# Patient Record
Sex: Male | Born: 1940 | Race: White | Hispanic: No | Marital: Married | Smoking: Former smoker
Health system: Southern US, Community
[De-identification: ages and names within clinical notes are randomized; demographics above are authoritative.]

## PROBLEM LIST (undated history)

## (undated) DIAGNOSIS — K589 Irritable bowel syndrome without diarrhea: Secondary | ICD-10-CM

## (undated) DIAGNOSIS — K219 Gastro-esophageal reflux disease without esophagitis: Secondary | ICD-10-CM

## (undated) DIAGNOSIS — R112 Nausea with vomiting, unspecified: Secondary | ICD-10-CM

## (undated) DIAGNOSIS — C9590 Leukemia, unspecified not having achieved remission: Secondary | ICD-10-CM

## (undated) DIAGNOSIS — H269 Unspecified cataract: Secondary | ICD-10-CM

## (undated) DIAGNOSIS — E785 Hyperlipidemia, unspecified: Secondary | ICD-10-CM

## (undated) DIAGNOSIS — Z9889 Other specified postprocedural states: Secondary | ICD-10-CM

## (undated) DIAGNOSIS — H3530 Unspecified macular degeneration: Secondary | ICD-10-CM

## (undated) DIAGNOSIS — F329 Major depressive disorder, single episode, unspecified: Secondary | ICD-10-CM

## (undated) DIAGNOSIS — K5730 Diverticulosis of large intestine without perforation or abscess without bleeding: Secondary | ICD-10-CM

## (undated) DIAGNOSIS — M1990 Unspecified osteoarthritis, unspecified site: Secondary | ICD-10-CM

## (undated) DIAGNOSIS — E039 Hypothyroidism, unspecified: Secondary | ICD-10-CM

## (undated) DIAGNOSIS — R972 Elevated prostate specific antigen [PSA]: Secondary | ICD-10-CM

## (undated) DIAGNOSIS — F32A Depression, unspecified: Secondary | ICD-10-CM

## (undated) DIAGNOSIS — F4310 Post-traumatic stress disorder, unspecified: Secondary | ICD-10-CM

## (undated) DIAGNOSIS — G4733 Obstructive sleep apnea (adult) (pediatric): Secondary | ICD-10-CM

## (undated) DIAGNOSIS — C61 Malignant neoplasm of prostate: Secondary | ICD-10-CM

## (undated) DIAGNOSIS — G4730 Sleep apnea, unspecified: Secondary | ICD-10-CM

## (undated) DIAGNOSIS — I10 Essential (primary) hypertension: Secondary | ICD-10-CM

## (undated) DIAGNOSIS — M5136 Other intervertebral disc degeneration, lumbar region: Secondary | ICD-10-CM

## (undated) DIAGNOSIS — M51369 Other intervertebral disc degeneration, lumbar region without mention of lumbar back pain or lower extremity pain: Secondary | ICD-10-CM

## (undated) DIAGNOSIS — K635 Polyp of colon: Secondary | ICD-10-CM

## (undated) DIAGNOSIS — R251 Tremor, unspecified: Secondary | ICD-10-CM

## (undated) DIAGNOSIS — C4A9 Merkel cell carcinoma, unspecified: Secondary | ICD-10-CM

## (undated) DIAGNOSIS — F528 Other sexual dysfunction not due to a substance or known physiological condition: Secondary | ICD-10-CM

## (undated) HISTORY — DX: Polyp of colon: K63.5

## (undated) HISTORY — DX: Sleep apnea, unspecified: G47.30

## (undated) HISTORY — DX: Elevated prostate specific antigen (PSA): R97.20

## (undated) HISTORY — DX: Gastro-esophageal reflux disease without esophagitis: K21.9

## (undated) HISTORY — DX: Diverticulosis of large intestine without perforation or abscess without bleeding: K57.30

## (undated) HISTORY — DX: Hypothyroidism, unspecified: E03.9

## (undated) HISTORY — PX: VASECTOMY: SHX75

## (undated) HISTORY — PX: PENILE PROSTHESIS IMPLANT: SHX240

## (undated) HISTORY — DX: Unspecified cataract: H26.9

## (undated) HISTORY — DX: Unspecified osteoarthritis, unspecified site: M19.90

## (undated) HISTORY — DX: Major depressive disorder, single episode, unspecified: F32.9

## (undated) HISTORY — PX: HEMORRHOID SURGERY: SHX153

## (undated) HISTORY — PX: ORIF TIBIA FRACTURE: SHX5416

## (undated) HISTORY — DX: Obstructive sleep apnea (adult) (pediatric): G47.33

## (undated) HISTORY — PX: TONSILLECTOMY: SUR1361

## (undated) HISTORY — DX: Hyperlipidemia, unspecified: E78.5

## (undated) HISTORY — DX: Irritable bowel syndrome, unspecified: K58.9

## (undated) HISTORY — DX: Leukemia, unspecified not having achieved remission: C95.90

## (undated) HISTORY — DX: Malignant neoplasm of prostate: C61

## (undated) HISTORY — DX: Unspecified macular degeneration: H35.30

## (undated) HISTORY — PX: COLONOSCOPY: SHX174

## (undated) HISTORY — DX: Essential (primary) hypertension: I10

## (undated) HISTORY — DX: Depression, unspecified: F32.A

## (undated) HISTORY — DX: Other sexual dysfunction not due to a substance or known physiological condition: F52.8

## (undated) HISTORY — PX: FOOT SURGERY: SHX648

## (undated) HISTORY — PX: CHOLECYSTECTOMY: SHX55

---

## 1999-04-02 DIAGNOSIS — D229 Melanocytic nevi, unspecified: Secondary | ICD-10-CM

## 1999-04-02 HISTORY — DX: Melanocytic nevi, unspecified: D22.9

## 1999-09-17 ENCOUNTER — Encounter: Payer: Self-pay | Admitting: Internal Medicine

## 2000-12-24 ENCOUNTER — Encounter: Payer: Self-pay | Admitting: Emergency Medicine

## 2000-12-24 ENCOUNTER — Inpatient Hospital Stay (HOSPITAL_COMMUNITY): Admission: EM | Admit: 2000-12-24 | Discharge: 2000-12-29 | Payer: Self-pay | Admitting: Emergency Medicine

## 2000-12-24 ENCOUNTER — Encounter: Payer: Self-pay | Admitting: Orthopedic Surgery

## 2000-12-26 ENCOUNTER — Encounter: Payer: Self-pay | Admitting: Orthopedic Surgery

## 2000-12-29 ENCOUNTER — Inpatient Hospital Stay (HOSPITAL_COMMUNITY)
Admission: RE | Admit: 2000-12-29 | Discharge: 2001-01-05 | Payer: Self-pay | Admitting: Physical Medicine & Rehabilitation

## 2001-02-03 ENCOUNTER — Other Ambulatory Visit: Admission: RE | Admit: 2001-02-03 | Discharge: 2001-02-03 | Payer: Self-pay | Admitting: Urology

## 2001-02-03 ENCOUNTER — Encounter (INDEPENDENT_AMBULATORY_CARE_PROVIDER_SITE_OTHER): Payer: Self-pay

## 2001-06-01 DIAGNOSIS — D229 Melanocytic nevi, unspecified: Secondary | ICD-10-CM

## 2001-06-01 HISTORY — DX: Melanocytic nevi, unspecified: D22.9

## 2002-01-29 ENCOUNTER — Encounter: Payer: Self-pay | Admitting: Orthopedic Surgery

## 2002-02-03 ENCOUNTER — Ambulatory Visit (HOSPITAL_COMMUNITY): Admission: RE | Admit: 2002-02-03 | Discharge: 2002-02-03 | Payer: Self-pay | Admitting: Orthopedic Surgery

## 2002-12-02 LAB — HM COLONOSCOPY

## 2003-08-03 ENCOUNTER — Encounter: Payer: Self-pay | Admitting: General Surgery

## 2003-08-05 ENCOUNTER — Encounter (INDEPENDENT_AMBULATORY_CARE_PROVIDER_SITE_OTHER): Payer: Self-pay | Admitting: *Deleted

## 2003-08-05 ENCOUNTER — Ambulatory Visit (HOSPITAL_COMMUNITY): Admission: RE | Admit: 2003-08-05 | Discharge: 2003-08-05 | Payer: Self-pay | Admitting: General Surgery

## 2003-08-05 ENCOUNTER — Encounter: Payer: Self-pay | Admitting: General Surgery

## 2003-08-16 ENCOUNTER — Encounter: Payer: Self-pay | Admitting: Internal Medicine

## 2003-09-16 ENCOUNTER — Ambulatory Visit (HOSPITAL_BASED_OUTPATIENT_CLINIC_OR_DEPARTMENT_OTHER): Admission: RE | Admit: 2003-09-16 | Discharge: 2003-09-16 | Payer: Self-pay | Admitting: Internal Medicine

## 2003-09-16 ENCOUNTER — Encounter: Payer: Self-pay | Admitting: Internal Medicine

## 2004-12-11 ENCOUNTER — Ambulatory Visit: Payer: Self-pay | Admitting: Internal Medicine

## 2004-12-21 ENCOUNTER — Ambulatory Visit: Payer: Self-pay | Admitting: Internal Medicine

## 2005-02-18 ENCOUNTER — Ambulatory Visit: Payer: Self-pay | Admitting: Internal Medicine

## 2005-02-27 ENCOUNTER — Ambulatory Visit: Payer: Self-pay | Admitting: Internal Medicine

## 2005-06-18 ENCOUNTER — Ambulatory Visit: Payer: Self-pay | Admitting: Internal Medicine

## 2005-06-25 ENCOUNTER — Ambulatory Visit: Payer: Self-pay | Admitting: Internal Medicine

## 2005-09-25 ENCOUNTER — Ambulatory Visit: Payer: Self-pay | Admitting: Internal Medicine

## 2005-10-02 ENCOUNTER — Ambulatory Visit: Payer: Self-pay | Admitting: Internal Medicine

## 2006-01-08 ENCOUNTER — Ambulatory Visit: Payer: Self-pay | Admitting: Internal Medicine

## 2006-04-10 ENCOUNTER — Ambulatory Visit: Payer: Self-pay | Admitting: Internal Medicine

## 2006-08-15 ENCOUNTER — Ambulatory Visit: Payer: Self-pay | Admitting: Internal Medicine

## 2006-12-04 ENCOUNTER — Ambulatory Visit: Payer: Self-pay | Admitting: Gastroenterology

## 2006-12-08 ENCOUNTER — Ambulatory Visit: Payer: Self-pay | Admitting: Internal Medicine

## 2006-12-08 LAB — CONVERTED CEMR LAB
ALT: 56 units/L — ABNORMAL HIGH (ref 0–40)
AST: 37 units/L (ref 0–37)
Albumin: 4.1 g/dL (ref 3.5–5.2)
Alkaline Phosphatase: 53 units/L (ref 39–117)
BUN: 12 mg/dL (ref 6–23)
Basophils Absolute: 0.1 10*3/uL (ref 0.0–0.1)
Basophils Relative: 0.7 % (ref 0.0–1.0)
Bilirubin, Direct: 0.2 mg/dL (ref 0.0–0.3)
CO2: 27 meq/L (ref 19–32)
Calcium: 9.4 mg/dL (ref 8.4–10.5)
Chloride: 100 meq/L (ref 96–112)
Creatinine, Ser: 1 mg/dL (ref 0.4–1.5)
Eosinophil percent: 2.2 % (ref 0.0–5.0)
GFR calc non Af Amer: 80 mL/min
Glomerular Filtration Rate, Af Am: 97 mL/min/{1.73_m2}
Glucose, Bld: 218 mg/dL — ABNORMAL HIGH (ref 70–99)
HCT: 45.6 % (ref 39.0–52.0)
Hemoglobin: 15.7 g/dL (ref 13.0–17.0)
Lymphocytes Relative: 25.5 % (ref 12.0–46.0)
MCHC: 34.5 g/dL (ref 30.0–36.0)
MCV: 81.2 fL (ref 78.0–100.0)
Monocytes Absolute: 0.8 10*3/uL — ABNORMAL HIGH (ref 0.2–0.7)
Monocytes Relative: 8.3 % (ref 3.0–11.0)
Neutro Abs: 6.2 10*3/uL (ref 1.4–7.7)
Neutrophils Relative %: 63.3 % (ref 43.0–77.0)
Platelets: 284 10*3/uL (ref 150–400)
Potassium: 3.7 meq/L (ref 3.5–5.1)
RBC: 5.62 M/uL (ref 4.22–5.81)
RDW: 13.1 % (ref 11.5–14.6)
Sodium: 135 meq/L (ref 135–145)
Total Bilirubin: 1.2 mg/dL (ref 0.3–1.2)
Total Protein: 6.9 g/dL (ref 6.0–8.3)
WBC: 9.8 10*3/uL (ref 4.5–10.5)

## 2006-12-18 ENCOUNTER — Ambulatory Visit: Payer: Self-pay | Admitting: Internal Medicine

## 2007-01-29 ENCOUNTER — Ambulatory Visit: Payer: Self-pay | Admitting: Internal Medicine

## 2007-01-29 LAB — CONVERTED CEMR LAB
ALT: 42 units/L — ABNORMAL HIGH (ref 0–40)
AST: 28 units/L (ref 0–37)
Albumin: 4.1 g/dL (ref 3.5–5.2)
Alkaline Phosphatase: 44 units/L (ref 39–117)
BUN: 11 mg/dL (ref 6–23)
Bilirubin, Direct: 0.1 mg/dL (ref 0.0–0.3)
CO2: 30 meq/L (ref 19–32)
Calcium: 9.1 mg/dL (ref 8.4–10.5)
Chloride: 98 meq/L (ref 96–112)
Cholesterol: 173 mg/dL (ref 0–200)
Creatinine, Ser: 1 mg/dL (ref 0.4–1.5)
GFR calc Af Amer: 97 mL/min
GFR calc non Af Amer: 80 mL/min
Glucose, Bld: 89 mg/dL (ref 70–99)
HDL: 38.7 mg/dL — ABNORMAL LOW (ref >39.0)
Hgb A1c MFr Bld: 6.7 % — ABNORMAL HIGH (ref 4.6–6.0)
LDL Cholesterol: 101 mg/dL — ABNORMAL HIGH (ref 0–99)
Potassium: 3.7 meq/L (ref 3.5–5.1)
Sodium: 136 meq/L (ref 135–145)
Total Bilirubin: 1.3 mg/dL — ABNORMAL HIGH (ref 0.3–1.2)
Total CHOL/HDL Ratio: 4.5
Total Protein: 6.7 g/dL (ref 6.0–8.3)
Triglycerides: 166 mg/dL — ABNORMAL HIGH (ref 0–149)
VLDL: 33 mg/dL (ref 0–40)

## 2007-05-07 ENCOUNTER — Ambulatory Visit: Payer: Self-pay | Admitting: Internal Medicine

## 2007-05-07 LAB — CONVERTED CEMR LAB
ALT: 45 units/L — ABNORMAL HIGH (ref 0–40)
AST: 31 units/L (ref 0–37)
Albumin: 4.3 g/dL (ref 3.5–5.2)
Alkaline Phosphatase: 42 units/L (ref 39–117)
BUN: 17 mg/dL (ref 6–23)
Bilirubin, Direct: 0.1 mg/dL (ref 0.0–0.3)
CO2: 29 meq/L (ref 19–32)
Calcium: 9.4 mg/dL (ref 8.4–10.5)
Chloride: 102 meq/L (ref 96–112)
Cholesterol: 185 mg/dL (ref 0–200)
Creatinine, Ser: 0.9 mg/dL (ref 0.4–1.5)
Direct LDL: 115.1 mg/dL
GFR calc Af Amer: 110 mL/min
GFR calc non Af Amer: 91 mL/min
Glucose, Bld: 85 mg/dL (ref 70–99)
HDL: 39.6 mg/dL (ref >39.0)
Hgb A1c MFr Bld: 7 % — ABNORMAL HIGH (ref 4.6–6.0)
Potassium: 3.8 meq/L (ref 3.5–5.1)
Sodium: 139 meq/L (ref 135–145)
Total Bilirubin: 1.2 mg/dL (ref 0.3–1.2)
Total CHOL/HDL Ratio: 4.7
Total Protein: 7.1 g/dL (ref 6.0–8.3)
Triglycerides: 218 mg/dL (ref 0–149)
VLDL: 44 mg/dL — ABNORMAL HIGH (ref 0–40)

## 2007-05-20 ENCOUNTER — Ambulatory Visit: Payer: Self-pay | Admitting: Internal Medicine

## 2007-09-22 ENCOUNTER — Telehealth: Payer: Self-pay | Admitting: Internal Medicine

## 2007-09-24 ENCOUNTER — Ambulatory Visit: Payer: Self-pay | Admitting: Internal Medicine

## 2007-09-24 DIAGNOSIS — I152 Hypertension secondary to endocrine disorders: Secondary | ICD-10-CM

## 2007-09-24 DIAGNOSIS — E11319 Type 2 diabetes mellitus with unspecified diabetic retinopathy without macular edema: Secondary | ICD-10-CM

## 2007-09-24 DIAGNOSIS — E785 Hyperlipidemia, unspecified: Secondary | ICD-10-CM

## 2007-09-24 DIAGNOSIS — Z9989 Dependence on other enabling machines and devices: Secondary | ICD-10-CM

## 2007-09-24 DIAGNOSIS — I10 Essential (primary) hypertension: Secondary | ICD-10-CM | POA: Insufficient documentation

## 2007-09-24 DIAGNOSIS — E1169 Type 2 diabetes mellitus with other specified complication: Secondary | ICD-10-CM | POA: Insufficient documentation

## 2007-09-24 DIAGNOSIS — E1139 Type 2 diabetes mellitus with other diabetic ophthalmic complication: Secondary | ICD-10-CM | POA: Insufficient documentation

## 2007-09-24 DIAGNOSIS — E1159 Type 2 diabetes mellitus with other circulatory complications: Secondary | ICD-10-CM | POA: Insufficient documentation

## 2007-09-24 DIAGNOSIS — E119 Type 2 diabetes mellitus without complications: Secondary | ICD-10-CM | POA: Insufficient documentation

## 2007-09-24 DIAGNOSIS — K219 Gastro-esophageal reflux disease without esophagitis: Secondary | ICD-10-CM | POA: Insufficient documentation

## 2007-09-24 DIAGNOSIS — G4733 Obstructive sleep apnea (adult) (pediatric): Secondary | ICD-10-CM

## 2007-09-24 DIAGNOSIS — E782 Mixed hyperlipidemia: Secondary | ICD-10-CM | POA: Insufficient documentation

## 2007-09-24 HISTORY — DX: Type 2 diabetes mellitus without complications: E11.9

## 2007-09-24 HISTORY — DX: Type 2 diabetes mellitus with other specified complication: E11.69

## 2007-09-24 HISTORY — DX: Hypertension secondary to endocrine disorders: I15.2

## 2007-09-24 HISTORY — DX: Type 2 diabetes mellitus with unspecified diabetic retinopathy without macular edema: E11.319

## 2007-09-24 HISTORY — DX: Type 2 diabetes mellitus with other circulatory complications: E11.59

## 2007-09-24 HISTORY — DX: Obstructive sleep apnea (adult) (pediatric): G47.33

## 2007-09-25 LAB — CONVERTED CEMR LAB
ALT: 56 units/L — ABNORMAL HIGH (ref 0–53)
AST: 39 units/L — ABNORMAL HIGH (ref 0–37)
Albumin: 4.3 g/dL (ref 3.5–5.2)
Alkaline Phosphatase: 52 units/L (ref 39–117)
BUN: 11 mg/dL (ref 6–23)
Bilirubin, Direct: 0.3 mg/dL (ref 0.0–0.3)
CO2: 30 meq/L (ref 19–32)
Calcium: 9.3 mg/dL (ref 8.4–10.5)
Chloride: 102 meq/L (ref 96–112)
Cholesterol: 157 mg/dL (ref 0–200)
Creatinine, Ser: 0.9 mg/dL (ref 0.4–1.5)
GFR calc Af Amer: 109 mL/min
GFR calc non Af Amer: 90 mL/min
Glucose, Bld: 110 mg/dL — ABNORMAL HIGH (ref 70–99)
HDL: 34.1 mg/dL — ABNORMAL LOW (ref >39.0)
Hgb A1c MFr Bld: 7.7 % — ABNORMAL HIGH (ref 4.6–6.0)
LDL Cholesterol: 88 mg/dL (ref 0–99)
Potassium: 4 meq/L (ref 3.5–5.1)
Sodium: 139 meq/L (ref 135–145)
Total Bilirubin: 1.6 mg/dL — ABNORMAL HIGH (ref 0.3–1.2)
Total CHOL/HDL Ratio: 4.6
Total Protein: 7 g/dL (ref 6.0–8.3)
Triglycerides: 174 mg/dL — ABNORMAL HIGH (ref 0–149)
VLDL: 35 mg/dL (ref 0–40)

## 2007-12-24 ENCOUNTER — Ambulatory Visit: Payer: Self-pay | Admitting: Internal Medicine

## 2007-12-24 LAB — CONVERTED CEMR LAB
ALT: 45 units/L (ref 0–53)
AST: 32 units/L (ref 0–37)
Albumin: 4.2 g/dL (ref 3.5–5.2)
Alkaline Phosphatase: 45 units/L (ref 39–117)
BUN: 9 mg/dL (ref 6–23)
Bilirubin, Direct: 0.2 mg/dL (ref 0.0–0.3)
CO2: 28 meq/L (ref 19–32)
Calcium: 9.3 mg/dL (ref 8.4–10.5)
Chloride: 99 meq/L (ref 96–112)
Cholesterol: 174 mg/dL (ref 0–200)
Creatinine, Ser: 1 mg/dL (ref 0.4–1.5)
Creatinine,U: 49.7 mg/dL
GFR calc Af Amer: 97 mL/min
GFR calc non Af Amer: 80 mL/min
Glucose, Bld: 81 mg/dL (ref 70–99)
HDL: 34.9 mg/dL — ABNORMAL LOW (ref >39.0)
Hgb A1c MFr Bld: 7.2 % — ABNORMAL HIGH (ref 4.6–6.0)
LDL Cholesterol: 111 mg/dL — ABNORMAL HIGH (ref 0–99)
Microalb Creat Ratio: 8 mg/g (ref 0.0–30.0)
Microalb, Ur: 0.4 mg/dL (ref 0.0–1.9)
Potassium: 4 meq/L (ref 3.5–5.1)
Sodium: 137 meq/L (ref 135–145)
Total Bilirubin: 1.4 mg/dL — ABNORMAL HIGH (ref 0.3–1.2)
Total CHOL/HDL Ratio: 5
Total Protein: 6.7 g/dL (ref 6.0–8.3)
Triglycerides: 141 mg/dL (ref 0–149)
VLDL: 28 mg/dL (ref 0–40)

## 2007-12-30 ENCOUNTER — Telehealth: Payer: Self-pay | Admitting: Internal Medicine

## 2007-12-30 ENCOUNTER — Ambulatory Visit: Payer: Self-pay | Admitting: Internal Medicine

## 2007-12-30 DIAGNOSIS — F528 Other sexual dysfunction not due to a substance or known physiological condition: Secondary | ICD-10-CM

## 2007-12-30 HISTORY — DX: Other sexual dysfunction not due to a substance or known physiological condition: F52.8

## 2008-01-05 ENCOUNTER — Encounter: Payer: Self-pay | Admitting: Internal Medicine

## 2008-01-22 ENCOUNTER — Telehealth (INDEPENDENT_AMBULATORY_CARE_PROVIDER_SITE_OTHER): Payer: Self-pay | Admitting: *Deleted

## 2008-01-22 ENCOUNTER — Encounter: Payer: Self-pay | Admitting: Internal Medicine

## 2008-01-26 ENCOUNTER — Ambulatory Visit: Payer: Self-pay | Admitting: Internal Medicine

## 2008-01-28 ENCOUNTER — Telehealth (INDEPENDENT_AMBULATORY_CARE_PROVIDER_SITE_OTHER): Payer: Self-pay | Admitting: *Deleted

## 2008-02-02 ENCOUNTER — Encounter: Payer: Self-pay | Admitting: Urology

## 2008-02-03 ENCOUNTER — Inpatient Hospital Stay (HOSPITAL_COMMUNITY): Admission: RE | Admit: 2008-02-03 | Discharge: 2008-02-04 | Payer: Self-pay | Admitting: Urology

## 2008-03-08 ENCOUNTER — Encounter: Payer: Self-pay | Admitting: Internal Medicine

## 2008-04-21 ENCOUNTER — Ambulatory Visit: Payer: Self-pay | Admitting: Internal Medicine

## 2008-04-21 LAB — CONVERTED CEMR LAB
ALT: 42 units/L (ref 0–53)
AST: 33 units/L (ref 0–37)
Albumin: 3.9 g/dL (ref 3.5–5.2)
Alkaline Phosphatase: 46 units/L (ref 39–117)
BUN: 12 mg/dL (ref 6–23)
Bilirubin, Direct: 0.1 mg/dL (ref 0.0–0.3)
CO2: 28 meq/L (ref 19–32)
Calcium: 8.5 mg/dL (ref 8.4–10.5)
Chloride: 100 meq/L (ref 96–112)
Cholesterol: 153 mg/dL (ref 0–200)
Creatinine, Ser: 0.9 mg/dL (ref 0.4–1.5)
GFR calc Af Amer: 109 mL/min
GFR calc non Af Amer: 90 mL/min
Glucose, Bld: 116 mg/dL — ABNORMAL HIGH (ref 70–99)
HDL: 32.6 mg/dL — ABNORMAL LOW (ref >39.0)
Hgb A1c MFr Bld: 7.4 % — ABNORMAL HIGH (ref 4.6–6.0)
LDL Cholesterol: 96 mg/dL (ref 0–99)
Potassium: 3.5 meq/L (ref 3.5–5.1)
Sodium: 137 meq/L (ref 135–145)
Total Bilirubin: 1.1 mg/dL (ref 0.3–1.2)
Total CHOL/HDL Ratio: 4.7
Total Protein: 6.6 g/dL (ref 6.0–8.3)
Triglycerides: 120 mg/dL (ref 0–149)
VLDL: 24 mg/dL (ref 0–40)

## 2008-04-28 ENCOUNTER — Ambulatory Visit: Payer: Self-pay | Admitting: Internal Medicine

## 2008-05-10 ENCOUNTER — Encounter: Payer: Self-pay | Admitting: Internal Medicine

## 2008-05-12 ENCOUNTER — Ambulatory Visit (HOSPITAL_COMMUNITY): Admission: RE | Admit: 2008-05-12 | Discharge: 2008-05-13 | Payer: Self-pay | Admitting: Urology

## 2008-06-24 ENCOUNTER — Telehealth: Payer: Self-pay | Admitting: Internal Medicine

## 2008-08-22 ENCOUNTER — Ambulatory Visit: Payer: Self-pay | Admitting: Internal Medicine

## 2008-08-22 DIAGNOSIS — Z860100 Personal history of colon polyps, unspecified: Secondary | ICD-10-CM

## 2008-08-22 HISTORY — DX: Personal history of colonic polyps: Z86.010

## 2008-08-22 HISTORY — DX: Personal history of colon polyps, unspecified: Z86.0100

## 2008-08-23 ENCOUNTER — Ambulatory Visit: Payer: Self-pay | Admitting: Internal Medicine

## 2008-08-23 LAB — CONVERTED CEMR LAB
ALT: 47 units/L (ref 0–53)
AST: 36 units/L (ref 0–37)
Albumin: 4 g/dL (ref 3.5–5.2)
Alkaline Phosphatase: 43 units/L (ref 39–117)
BUN: 10 mg/dL (ref 6–23)
Bilirubin, Direct: 0.2 mg/dL (ref 0.0–0.3)
CO2: 31 meq/L (ref 19–32)
Calcium: 9 mg/dL (ref 8.4–10.5)
Chloride: 105 meq/L (ref 96–112)
Cholesterol: 148 mg/dL (ref 0–200)
Creatinine, Ser: 0.9 mg/dL (ref 0.4–1.5)
GFR calc Af Amer: 109 mL/min
GFR calc non Af Amer: 90 mL/min
Glucose, Bld: 118 mg/dL — ABNORMAL HIGH (ref 70–99)
HDL: 34.9 mg/dL — ABNORMAL LOW (ref >39.0)
Hgb A1c MFr Bld: 7.8 % — ABNORMAL HIGH (ref 4.6–6.0)
LDL Cholesterol: 78 mg/dL (ref 0–99)
Potassium: 4.1 meq/L (ref 3.5–5.1)
Sodium: 141 meq/L (ref 135–145)
Total Bilirubin: 1.2 mg/dL (ref 0.3–1.2)
Total CHOL/HDL Ratio: 4.2
Total Protein: 6.7 g/dL (ref 6.0–8.3)
Triglycerides: 176 mg/dL — ABNORMAL HIGH (ref 0–149)
VLDL: 35 mg/dL (ref 0–40)

## 2008-08-30 ENCOUNTER — Ambulatory Visit: Payer: Self-pay | Admitting: Internal Medicine

## 2008-09-12 ENCOUNTER — Encounter: Payer: Self-pay | Admitting: Internal Medicine

## 2008-09-28 ENCOUNTER — Encounter: Payer: Self-pay | Admitting: Internal Medicine

## 2008-09-28 ENCOUNTER — Ambulatory Visit: Payer: Self-pay | Admitting: Internal Medicine

## 2008-09-28 HISTORY — PX: ESOPHAGOGASTRODUODENOSCOPY ENDOSCOPY: SHX5814

## 2008-09-30 ENCOUNTER — Encounter: Payer: Self-pay | Admitting: Internal Medicine

## 2008-10-13 ENCOUNTER — Ambulatory Visit: Payer: Self-pay | Admitting: Family Medicine

## 2008-10-25 ENCOUNTER — Ambulatory Visit: Payer: Self-pay | Admitting: Internal Medicine

## 2008-10-25 DIAGNOSIS — E669 Obesity, unspecified: Secondary | ICD-10-CM | POA: Insufficient documentation

## 2008-12-30 ENCOUNTER — Ambulatory Visit: Payer: Self-pay | Admitting: Internal Medicine

## 2008-12-30 LAB — CONVERTED CEMR LAB
ALT: 34 units/L (ref 0–53)
AST: 25 units/L (ref 0–37)
BUN: 12 mg/dL (ref 6–23)
CO2: 29 meq/L (ref 19–32)
Calcium: 8.9 mg/dL (ref 8.4–10.5)
Chloride: 100 meq/L (ref 96–112)
Cholesterol: 155 mg/dL (ref 0–200)
Creatinine, Ser: 0.9 mg/dL (ref 0.4–1.5)
GFR calc Af Amer: 109 mL/min
GFR calc non Af Amer: 90 mL/min
Glucose, Bld: 89 mg/dL (ref 70–99)
HDL: 46.6 mg/dL (ref >39.0)
Hgb A1c MFr Bld: 7 % — ABNORMAL HIGH (ref 4.6–6.0)
LDL Cholesterol: 90 mg/dL (ref 0–99)
Potassium: 3.7 meq/L (ref 3.5–5.1)
Sodium: 139 meq/L (ref 135–145)
Total CHOL/HDL Ratio: 3.3
Triglycerides: 94 mg/dL (ref 0–149)
VLDL: 19 mg/dL (ref 0–40)

## 2009-03-07 ENCOUNTER — Encounter: Payer: Self-pay | Admitting: Internal Medicine

## 2009-04-24 ENCOUNTER — Telehealth: Payer: Self-pay | Admitting: Internal Medicine

## 2009-04-24 ENCOUNTER — Ambulatory Visit: Payer: Self-pay | Admitting: Internal Medicine

## 2009-04-24 LAB — CONVERTED CEMR LAB
ALT: 37 units/L (ref 0–53)
AST: 28 units/L (ref 0–37)
Albumin: 3.8 g/dL (ref 3.5–5.2)
Alkaline Phosphatase: 37 units/L — ABNORMAL LOW (ref 39–117)
BUN: 11 mg/dL (ref 6–23)
Bilirubin, Direct: 0.2 mg/dL (ref 0.0–0.3)
CO2: 26 meq/L (ref 19–32)
Calcium: 8.7 mg/dL (ref 8.4–10.5)
Chloride: 107 meq/L (ref 96–112)
Cholesterol: 146 mg/dL (ref 0–200)
Creatinine, Ser: 1 mg/dL (ref 0.4–1.5)
GFR calc non Af Amer: 79.53 mL/min (ref >60)
Glucose, Bld: 100 mg/dL — ABNORMAL HIGH (ref 70–99)
HDL: 33.7 mg/dL — ABNORMAL LOW (ref >39.00)
Hgb A1c MFr Bld: 6.8 % — ABNORMAL HIGH (ref 4.6–6.5)
LDL Cholesterol: 90 mg/dL (ref 0–99)
Potassium: 3.4 meq/L — ABNORMAL LOW (ref 3.5–5.1)
Sodium: 140 meq/L (ref 135–145)
Total Bilirubin: 0.8 mg/dL (ref 0.3–1.2)
Total CHOL/HDL Ratio: 4
Total Protein: 6.5 g/dL (ref 6.0–8.3)
Triglycerides: 111 mg/dL (ref 0.0–149.0)
VLDL: 22.2 mg/dL (ref 0.0–40.0)

## 2009-05-19 ENCOUNTER — Ambulatory Visit: Payer: Self-pay | Admitting: Internal Medicine

## 2009-06-16 ENCOUNTER — Encounter: Admission: RE | Admit: 2009-06-16 | Discharge: 2009-06-16 | Payer: Self-pay | Admitting: Specialist

## 2009-06-17 ENCOUNTER — Encounter: Admission: RE | Admit: 2009-06-17 | Discharge: 2009-06-17 | Payer: Self-pay | Admitting: Specialist

## 2009-06-30 ENCOUNTER — Telehealth (INDEPENDENT_AMBULATORY_CARE_PROVIDER_SITE_OTHER): Payer: Self-pay | Admitting: *Deleted

## 2009-07-03 ENCOUNTER — Encounter: Payer: Self-pay | Admitting: Internal Medicine

## 2009-07-28 ENCOUNTER — Ambulatory Visit (HOSPITAL_BASED_OUTPATIENT_CLINIC_OR_DEPARTMENT_OTHER): Admission: RE | Admit: 2009-07-28 | Discharge: 2009-07-28 | Payer: Self-pay | Admitting: Specialist

## 2009-08-08 ENCOUNTER — Encounter: Admission: RE | Admit: 2009-08-08 | Discharge: 2009-08-31 | Payer: Self-pay | Admitting: Specialist

## 2009-08-17 ENCOUNTER — Telehealth: Payer: Self-pay | Admitting: Internal Medicine

## 2009-08-24 ENCOUNTER — Telehealth: Payer: Self-pay | Admitting: Internal Medicine

## 2009-08-28 ENCOUNTER — Encounter: Payer: Self-pay | Admitting: Internal Medicine

## 2009-09-14 ENCOUNTER — Ambulatory Visit: Payer: Self-pay | Admitting: Internal Medicine

## 2009-09-14 LAB — CONVERTED CEMR LAB
ALT: 46 units/L (ref 0–53)
AST: 34 units/L (ref 0–37)
Albumin: 4.1 g/dL (ref 3.5–5.2)
Alkaline Phosphatase: 44 units/L (ref 39–117)
BUN: 13 mg/dL (ref 6–23)
Bilirubin, Direct: 0.1 mg/dL (ref 0.0–0.3)
CO2: 29 meq/L (ref 19–32)
Calcium: 8.9 mg/dL (ref 8.4–10.5)
Chloride: 98 meq/L (ref 96–112)
Cholesterol: 151 mg/dL (ref 0–200)
Creatinine, Ser: 1 mg/dL (ref 0.4–1.5)
GFR calc non Af Amer: 79.44 mL/min (ref >60)
Glucose, Bld: 114 mg/dL — ABNORMAL HIGH (ref 70–99)
HDL: 38.4 mg/dL — ABNORMAL LOW (ref >39.00)
Hgb A1c MFr Bld: 6.8 % — ABNORMAL HIGH (ref 4.6–6.5)
LDL Cholesterol: 77 mg/dL (ref 0–99)
Potassium: 4 meq/L (ref 3.5–5.1)
Sodium: 137 meq/L (ref 135–145)
Total Bilirubin: 1 mg/dL (ref 0.3–1.2)
Total CHOL/HDL Ratio: 4
Total Protein: 6.7 g/dL (ref 6.0–8.3)
Triglycerides: 180 mg/dL — ABNORMAL HIGH (ref 0.0–149.0)
VLDL: 36 mg/dL (ref 0.0–40.0)

## 2009-09-21 ENCOUNTER — Ambulatory Visit: Payer: Self-pay | Admitting: Internal Medicine

## 2009-09-21 DIAGNOSIS — G4700 Insomnia, unspecified: Secondary | ICD-10-CM

## 2009-09-21 DIAGNOSIS — F329 Major depressive disorder, single episode, unspecified: Secondary | ICD-10-CM

## 2009-09-21 HISTORY — DX: Insomnia, unspecified: G47.00

## 2009-10-10 ENCOUNTER — Encounter (INDEPENDENT_AMBULATORY_CARE_PROVIDER_SITE_OTHER): Payer: Self-pay | Admitting: *Deleted

## 2009-11-23 ENCOUNTER — Telehealth: Payer: Self-pay | Admitting: *Deleted

## 2009-12-02 HISTORY — PX: SHOULDER SURGERY: SHX246

## 2010-01-09 ENCOUNTER — Ambulatory Visit: Payer: Self-pay | Admitting: Internal Medicine

## 2010-01-09 LAB — CONVERTED CEMR LAB
ALT: 37 units/L (ref 0–53)
AST: 27 units/L (ref 0–37)
Albumin: 4 g/dL (ref 3.5–5.2)
Alkaline Phosphatase: 51 units/L (ref 39–117)
BUN: 11 mg/dL (ref 6–23)
Bilirubin, Direct: 0.1 mg/dL (ref 0.0–0.3)
CO2: 30 meq/L (ref 19–32)
Calcium: 8.9 mg/dL (ref 8.4–10.5)
Chloride: 104 meq/L (ref 96–112)
Cholesterol: 138 mg/dL (ref 0–200)
Creatinine, Ser: 1 mg/dL (ref 0.4–1.5)
Direct LDL: 85.6 mg/dL
GFR calc non Af Amer: 79.36 mL/min (ref >60)
Glucose, Bld: 109 mg/dL — ABNORMAL HIGH (ref 70–99)
HDL: 40.9 mg/dL (ref >39.00)
Hgb A1c MFr Bld: 7.5 % — ABNORMAL HIGH (ref 4.6–6.5)
Potassium: 3.7 meq/L (ref 3.5–5.1)
Sodium: 140 meq/L (ref 135–145)
Total Bilirubin: 0.5 mg/dL (ref 0.3–1.2)
Total CHOL/HDL Ratio: 3
Total Protein: 6.6 g/dL (ref 6.0–8.3)
Triglycerides: 209 mg/dL — ABNORMAL HIGH (ref 0.0–149.0)
VLDL: 41.8 mg/dL — ABNORMAL HIGH (ref 0.0–40.0)

## 2010-01-17 ENCOUNTER — Ambulatory Visit: Payer: Self-pay | Admitting: Internal Medicine

## 2010-01-18 ENCOUNTER — Telehealth: Payer: Self-pay | Admitting: Internal Medicine

## 2010-02-20 ENCOUNTER — Encounter: Payer: Self-pay | Admitting: Internal Medicine

## 2010-02-27 ENCOUNTER — Encounter: Payer: Self-pay | Admitting: Internal Medicine

## 2010-03-19 ENCOUNTER — Telehealth: Payer: Self-pay | Admitting: Internal Medicine

## 2010-03-19 ENCOUNTER — Ambulatory Visit: Payer: Self-pay | Admitting: Internal Medicine

## 2010-03-26 ENCOUNTER — Encounter: Payer: Self-pay | Admitting: Internal Medicine

## 2010-04-26 ENCOUNTER — Encounter: Payer: Self-pay | Admitting: Internal Medicine

## 2010-05-17 ENCOUNTER — Ambulatory Visit (HOSPITAL_COMMUNITY)
Admission: RE | Admit: 2010-05-17 | Discharge: 2010-05-17 | Payer: Self-pay | Source: Home / Self Care | Admitting: Surgery

## 2010-05-23 ENCOUNTER — Ambulatory Visit: Payer: Self-pay | Admitting: Internal Medicine

## 2010-05-25 LAB — CONVERTED CEMR LAB
ALT: 45 units/L (ref 0–53)
AST: 38 units/L — ABNORMAL HIGH (ref 0–37)
Albumin: 4.2 g/dL (ref 3.5–5.2)
Alkaline Phosphatase: 49 units/L (ref 39–117)
BUN: 14 mg/dL (ref 6–23)
Bilirubin, Direct: 0.2 mg/dL (ref 0.0–0.3)
CO2: 31 meq/L (ref 19–32)
Calcium: 8.9 mg/dL (ref 8.4–10.5)
Chloride: 103 meq/L (ref 96–112)
Cholesterol: 159 mg/dL (ref 0–200)
Creatinine, Ser: 1 mg/dL (ref 0.4–1.5)
Direct LDL: 98.8 mg/dL
Free T4: 0.84 ng/dL (ref 0.60–1.60)
GFR calc non Af Amer: 83.09 mL/min (ref >60)
Glucose, Bld: 108 mg/dL — ABNORMAL HIGH (ref 70–99)
H Pylori IgG: NEGATIVE
HDL: 39.9 mg/dL (ref >39.00)
Hgb A1c MFr Bld: 8 % — ABNORMAL HIGH (ref 4.6–6.5)
Potassium: 4.2 meq/L (ref 3.5–5.1)
Sodium: 142 meq/L (ref 135–145)
TSH: 2.29 microintl units/mL (ref 0.35–5.50)
Total Bilirubin: 1.1 mg/dL (ref 0.3–1.2)
Total CHOL/HDL Ratio: 4
Total Protein: 6.7 g/dL (ref 6.0–8.3)
Triglycerides: 238 mg/dL — ABNORMAL HIGH (ref 0.0–149.0)
VLDL: 47.6 mg/dL — ABNORMAL HIGH (ref 0.0–40.0)

## 2010-06-19 ENCOUNTER — Encounter: Admission: RE | Admit: 2010-06-19 | Discharge: 2010-08-31 | Payer: Self-pay | Admitting: Surgery

## 2010-06-22 ENCOUNTER — Ambulatory Visit: Payer: Self-pay | Admitting: Internal Medicine

## 2010-07-23 ENCOUNTER — Ambulatory Visit: Payer: Self-pay | Admitting: Internal Medicine

## 2010-07-25 ENCOUNTER — Telehealth (INDEPENDENT_AMBULATORY_CARE_PROVIDER_SITE_OTHER): Payer: Self-pay | Admitting: *Deleted

## 2010-08-31 ENCOUNTER — Ambulatory Visit: Payer: Self-pay | Admitting: Internal Medicine

## 2010-09-01 LAB — HM DIABETES EYE EXAM

## 2010-10-01 ENCOUNTER — Ambulatory Visit: Payer: Self-pay | Admitting: Internal Medicine

## 2010-10-31 ENCOUNTER — Ambulatory Visit: Payer: Self-pay | Admitting: Internal Medicine

## 2010-11-02 LAB — CONVERTED CEMR LAB
ALT: 41 units/L (ref 0–53)
AST: 30 units/L (ref 0–37)
Albumin: 3.9 g/dL (ref 3.5–5.2)
Alkaline Phosphatase: 51 units/L (ref 39–117)
BUN: 16 mg/dL (ref 6–23)
Basophils Absolute: 0.1 10*3/uL (ref 0.0–0.1)
Basophils Relative: 0.7 % (ref 0.0–3.0)
Bilirubin, Direct: 0.2 mg/dL (ref 0.0–0.3)
CO2: 29 meq/L (ref 19–32)
Calcium: 9.1 mg/dL (ref 8.4–10.5)
Chloride: 100 meq/L (ref 96–112)
Cholesterol: 146 mg/dL (ref 0–200)
Creatinine, Ser: 1 mg/dL (ref 0.4–1.5)
Direct LDL: 81.1 mg/dL
Eosinophils Absolute: 0.3 10*3/uL (ref 0.0–0.7)
Eosinophils Relative: 2.9 % (ref 0.0–5.0)
GFR calc non Af Amer: 76.51 mL/min (ref >60)
Glucose, Bld: 191 mg/dL — ABNORMAL HIGH (ref 70–99)
HCT: 42.6 % (ref 39.0–52.0)
HDL: 38.8 mg/dL — ABNORMAL LOW (ref >39.00)
Hemoglobin: 14.5 g/dL (ref 13.0–17.0)
Hgb A1c MFr Bld: 8.3 % — ABNORMAL HIGH (ref 4.6–6.5)
Lymphocytes Relative: 29.9 % (ref 12.0–46.0)
Lymphs Abs: 3.1 10*3/uL (ref 0.7–4.0)
MCHC: 34 g/dL (ref 30.0–36.0)
MCV: 82.7 fL (ref 78.0–100.0)
Monocytes Absolute: 0.7 10*3/uL (ref 0.1–1.0)
Monocytes Relative: 6.6 % (ref 3.0–12.0)
Neutro Abs: 6.1 10*3/uL (ref 1.4–7.7)
Neutrophils Relative %: 59.9 % (ref 43.0–77.0)
Platelets: 223 10*3/uL (ref 150.0–400.0)
Potassium: 3.8 meq/L (ref 3.5–5.1)
RBC: 5.15 M/uL (ref 4.22–5.81)
RDW: 14.6 % (ref 11.5–14.6)
Sodium: 137 meq/L (ref 135–145)
Total Bilirubin: 0.7 mg/dL (ref 0.3–1.2)
Total CHOL/HDL Ratio: 4
Total Protein: 6.1 g/dL (ref 6.0–8.3)
Triglycerides: 252 mg/dL — ABNORMAL HIGH (ref 0.0–149.0)
VLDL: 50.4 mg/dL — ABNORMAL HIGH (ref 0.0–40.0)
WBC: 10.2 10*3/uL (ref 4.5–10.5)

## 2010-11-06 ENCOUNTER — Telehealth: Payer: Self-pay | Admitting: Internal Medicine

## 2010-11-20 ENCOUNTER — Ambulatory Visit: Payer: Self-pay | Admitting: Internal Medicine

## 2010-11-28 ENCOUNTER — Ambulatory Visit
Admission: RE | Admit: 2010-11-28 | Discharge: 2010-11-28 | Payer: Self-pay | Source: Home / Self Care | Attending: Family Medicine | Admitting: Family Medicine

## 2010-12-24 ENCOUNTER — Ambulatory Visit
Admission: RE | Admit: 2010-12-24 | Discharge: 2010-12-24 | Payer: Self-pay | Source: Home / Self Care | Attending: Internal Medicine | Admitting: Internal Medicine

## 2010-12-24 DIAGNOSIS — J329 Chronic sinusitis, unspecified: Secondary | ICD-10-CM | POA: Insufficient documentation

## 2010-12-25 ENCOUNTER — Ambulatory Visit: Payer: Self-pay | Admitting: Internal Medicine

## 2010-12-28 ENCOUNTER — Telehealth: Payer: Self-pay | Admitting: Internal Medicine

## 2011-01-01 NOTE — Assessment & Plan Note (Signed)
Summary: form completion//ccm---PT RSC (BMP) // RS   Vital Signs:  Patient profile:   70 year old male Height:      72.5 inches Weight:      259 pounds BMI:     34.77 Pulse rate:   96 / minute Pulse rhythm:   regular Resp:     14 per minute BP sitting:   142 / 76  (left arm) Cuff size:   regular  Vitals Entered By: Gladis Riffle, RN (March 19, 2010 7:50 AM) CC: form completion--CBGs checked periodically at home, last 165 Is Patient Diabetic? Yes Did you bring your meter with you today? No   Primary Care Provider:  Birdie Sons MD  CC:  form completion--CBGs checked periodically at home and last 165.  History of Present Illness: interested in weight loss surgery reviewed information from, [facility] multiple complications from Obesity: including DM , HTN, oa of knees. Also GERD, OSA)  tolerating meds without difficulty  All other systems reviewed and were negative   Preventive Screening-Counseling & Management  Alcohol-Tobacco     Smoking Status: quit     Year Quit: 1990  Current Problems (verified): 1)  Knee Pain, Bilateral  (ICD-719.46) 2)  Depression  (ICD-311) 3)  Obesity  (ICD-278.00) 4)  Personal Hx Colonic Polyps  (ICD-V12.72) 5)  Dysphagia  (ICD-787.29) 6)  Erectile Dysfunction  (ICD-302.72) 7)  Obstructive Sleep Apnea  (ICD-327.23) 8)  Psa, Increased  (ICD-790.93) 9)  Diabetic Retinopathy  (ICD-250.50) 10)  Diverticulosis, Colon  (ICD-562.10) 11)  Hypertension  (ICD-401.9) 12)  Hyperlipidemia  (ICD-272.4) 13)  Gerd  (ICD-530.81) 14)  Diabetes Mellitus, Type II  (ICD-250.00)  Current Medications (verified): 1)  Amlodipine Besylate 10 Mg Tabs (Amlodipine Besylate) .Marland Kitchen.. 1 Tablet By Mouth Once A Day 2)  Cozaar 100 Mg Tabs (Losartan Potassium) .Marland Kitchen.. 1 By Mouth Once Daily 3)  Glucophage 1000 Mg Tabs (Metformin Hcl) .... Take 1 Tablet By Mouth Twice A Day 4)  Hydrochlorothiazide 25 Mg Tabs (Hydrochlorothiazide) .... Take 1 Tablet By Mouth Every Morning 5)  Prilosec  20 Mg Cpdr (Omeprazole) .... Take 1 Capsule By Mouth Twice A Day 6)  Simvastatin 40 Mg Tabs (Simvastatin) .... Take 1 Tablet By Mouth At Bedtime 7)  Levothyroxine Sodium 50 Mcg  Tabs (Levothyroxine Sodium) .... Take 1 Tablet By Mouth Once A Day 8)  Lantus 100 Unit/ml  Soln (Insulin Glargine) .... 80 Subcutaneously Once Daily Hs 9)  Hydrocortisone 2.5 %  Lotn (Hydrocortisone) .... Once Daily As Needed 10)  Onetouch Ultra Test   Strp (Glucose Blood) .... Use Two Times A Day or As Directed 11)  Klor-Con M20 20 Meq Cr-Tabs (Potassium Chloride Crys Cr) .... Take 1 Tablet By Mouth Once A Day 12)  Trazodone Hcl 50 Mg Tabs (Trazodone Hcl) .Marland Kitchen.. 1-2 Q Day 13)  Citalopram Hydrobromide 40 Mg Tabs (Citalopram Hydrobromide) .... One By Mouth Daily  Allergies: 1)  Morphine Sulfate (Morphine Sulfate)  Past History:  Past Medical History: Last updated: 09/21/2009 Colonic polyps, hx of Diabetes mellitus, type II Diverticulosis, colon GERD Hyperlipidemia Hypertension OSA elevated PSA diabetic retinopathy Hypothyroidism Irritable Bowel Syndrome Obesity Depression  Past Surgical History: Last updated: 08/19/2008 R leg fx--ORIF Cholecystectomy Hemorrhoidectomy  Family History: Last updated: 2008/09/02 father deceased MVA mother stomach CA age 13 Diabetes- Patient  Social History: Last updated: 09/02/2008 Retired Married Regular exercise-no Caffeine- sodas  Risk Factors: Exercise: no (12/30/2007)  Risk Factors: Smoking Status: quit (03/19/2010)  Review of Systems  All other systems reviewed and were negative   Physical Exam  General:  Well-developed,well-nourished,in no acute distress; alert,appropriate and cooperative throughout examination Head:  normocephalic and atraumatic.   Eyes:  pupils equal and pupils round.   Ears:  R ear normal and L ear normal.   Nose:  no external deformity and no external erythema.   Neck:  No deformities, masses, or tenderness  noted. Chest Wall:  No deformities, masses, tenderness or gynecomastia noted. Lungs:  normal respiratory effort and no intercostal retractions.   Heart:  normal rate and regular rhythm.   Abdomen:  Bowel sounds positive,abdomen soft and non-tender without masses, organomegaly or hernias noted. obese Msk:  No deformity or scoliosis noted of thoracic or lumbar spine.   Extremities:  No clubbing, cyanosis, edema, or deformity noted  Neurologic:  cranial nerves II-XII intact and gait normal.     Impression & Recommendations:  Problem # 1:  OBESITY (ICD-278.00) this is clearly his most significant problem consedring surgical options letter to surgeons  form completion for weight loss surgery (total time >1/2 face to face >25 minutes)  Problem # 2:  OBSTRUCTIVE SLEEP APNEA (ICD-327.23) related to above using CPAP  Problem # 3:  DIABETES MELLITUS, TYPE II (ICD-250.00) not adequately controlled see previous note weight loss is the key to long term success His updated medication list for this problem includes:    Cozaar 100 Mg Tabs (Losartan potassium) .Marland Kitchen... 1 by mouth once daily    Glucophage 1000 Mg Tabs (Metformin hcl) .Marland Kitchen... Take 1 tablet by mouth twice a day    Lantus 100 Unit/ml Soln (Insulin glargine) .Marland KitchenMarland KitchenMarland KitchenMarland Kitchen 80 subcutaneously once daily hs  Labs Reviewed: Creat: 1.0 (01/09/2010)     Last Eye Exam: normal-pt's report (09/01/2009) Reviewed HgBA1c results: 7.5 (01/09/2010)  6.8 (09/14/2009)  Problem # 4:  DIABETIC  RETINOPATHY (ICD-250.50) yearly f/u with ophthalmology His updated medication list for this problem includes:    Cozaar 100 Mg Tabs (Losartan potassium) .Marland Kitchen... 1 by mouth once daily    Glucophage 1000 Mg Tabs (Metformin hcl) .Marland Kitchen... Take 1 tablet by mouth twice a day    Lantus 100 Unit/ml Soln (Insulin glargine) .Marland KitchenMarland KitchenMarland KitchenMarland Kitchen 80 subcutaneously once daily hs  Labs Reviewed: Creat: 1.0 (01/09/2010)     Last Eye Exam: normal-pt's report (09/01/2009) Reviewed HgBA1c results:  7.5 (01/09/2010)  6.8 (09/14/2009)  Problem # 5:  HYPERLIPIDEMIA (ICD-272.4) controlled continue current medications  His updated medication list for this problem includes:    Simvastatin 40 Mg Tabs (Simvastatin) .Marland Kitchen... Take 1 tablet by mouth at bedtime  Labs Reviewed: SGOT: 27 (01/09/2010)   SGPT: 37 (01/09/2010)   HDL:40.90 (01/09/2010), 38.40 (09/14/2009)  LDL:77 (09/14/2009), 90 (04/24/2009)  Chol:138 (01/09/2010), 151 (09/14/2009)  Trig:209.0 (01/09/2010), 180.0 (09/14/2009)  Complete Medication List: 1)  Amlodipine Besylate 10 Mg Tabs (Amlodipine besylate) .Marland Kitchen.. 1 tablet by mouth once a day 2)  Cozaar 100 Mg Tabs (Losartan potassium) .Marland Kitchen.. 1 by mouth once daily 3)  Glucophage 1000 Mg Tabs (Metformin hcl) .... Take 1 tablet by mouth twice a day 4)  Hydrochlorothiazide 25 Mg Tabs (Hydrochlorothiazide) .... Take 1 tablet by mouth every morning 5)  Prilosec 20 Mg Cpdr (Omeprazole) .... Take 1 capsule by mouth twice a day 6)  Simvastatin 40 Mg Tabs (Simvastatin) .... Take 1 tablet by mouth at bedtime 7)  Levothyroxine Sodium 50 Mcg Tabs (Levothyroxine sodium) .... Take 1 tablet by mouth once a day 8)  Lantus 100 Unit/ml Soln (Insulin glargine) .... 80 subcutaneously once daily  hs 9)  Hydrocortisone 2.5 % Lotn (Hydrocortisone) .... Once daily as needed 10)  Onetouch Ultra Test Strp (Glucose blood) .... Use two times a day or as directed 11)  Klor-con M20 20 Meq Cr-tabs (Potassium chloride crys cr) .... Take 1 tablet by mouth once a day 12)  Trazodone Hcl 50 Mg Tabs (Trazodone hcl) .Marland Kitchen.. 1-2 q day 13)  Citalopram Hydrobromide 40 Mg Tabs (Citalopram hydrobromide) .... One by mouth daily Prescriptions: ONETOUCH ULTRA TEST   STRP (GLUCOSE BLOOD) use two times a day or as directed  #100 x 11   Entered by:   Gladis Riffle, RN   Authorized by:   Birdie Sons MD   Signed by:   Gladis Riffle, RN on 03/19/2010   Method used:   Faxed to ...       [facility]       [address]       [place], [place]  [address]       Ph: [phone] or [phone]       Fax: [phone]   RxID:   [id number]

## 2011-01-01 NOTE — Assessment & Plan Note (Signed)
Summary: 1 month rov/njr/PT RESCD FROM BUMP//CCM   Vital Signs:  Patient profile:   70 year old male Weight:      266 pounds BMI:     36.21 Pulse rate:   84 / minute Pulse rhythm:   regular Resp:     12 per minute BP sitting:   136 / 70  (left arm) Cuff size:   regular  Vitals Entered By: Gladis Riffle, RN (July 23, 2010 7:53 AM) CC: 1 month rov, CBGs not done recently Is Patient Diabetic? Yes Did you bring your meter with you today? No   Primary Care Provider:  Birdie Sons MD  CC:  1 month rov and CBGs not done recently.  History of Present Illness:  Follow-Up Visit      This is a 70 year old man who presents for Follow-up visit.  The patient denies chest pain and palpitations.  Since the last visit the patient notes no new problems or concerns.  The patient reports taking meds as prescribed and dietary compliance.  When questioned about possible medication side effects, the patient notes none.    Trying to eat better---not losing any weight  All other systems reviewed and were negative   Preventive Screening-Counseling & Management  Alcohol-Tobacco     Smoking Status: quit     Year Quit: 1990  Current Medications (verified): 1)  Amlodipine Besylate 10 Mg Tabs (Amlodipine Besylate) .Marland Kitchen.. 1 Tablet By Mouth Once A Day 2)  Cozaar 100 Mg Tabs (Losartan Potassium) .Marland Kitchen.. 1 By Mouth Once Daily 3)  Glucophage 1000 Mg Tabs (Metformin Hcl) .... Take 1 Tablet By Mouth Twice A Day 4)  Hydrochlorothiazide 25 Mg Tabs (Hydrochlorothiazide) .... Take 1 Tablet By Mouth Every Morning 5)  Prilosec 20 Mg Cpdr (Omeprazole) .... Take 1 Capsule By Mouth Twice A Day 6)  Simvastatin 40 Mg Tabs (Simvastatin) .... Take 1 Tablet By Mouth At Bedtime 7)  Levothyroxine Sodium 50 Mcg  Tabs (Levothyroxine Sodium) .... Take 1 Tablet By Mouth Once A Day 8)  Lantus 100 Unit/ml  Soln (Insulin Glargine) .... 80 Subcutaneously Once Daily Hs 9)  Hydrocortisone 2.5 %  Lotn (Hydrocortisone) .... Once Daily As  Needed 10)  Onetouch Ultra Test   Strp (Glucose Blood) .... Use Two Times A Day or As Directed 11)  Klor-Con M20 20 Meq Cr-Tabs (Potassium Chloride Crys Cr) .... Take 1 Tablet By Mouth Once A Day 12)  Trazodone Hcl 50 Mg Tabs (Trazodone Hcl) .Marland Kitchen.. 1-2 Q Day 13)  Citalopram Hydrobromide 40 Mg Tabs (Citalopram Hydrobromide) .... One By Mouth Daily  Allergies: 1)  Morphine Sulfate (Morphine Sulfate)  Past History:  Past Medical History: Last updated: 09/21/2009 Colonic polyps, hx of Diabetes mellitus, type II Diverticulosis, colon GERD Hyperlipidemia Hypertension OSA elevated PSA diabetic retinopathy Hypothyroidism Irritable Bowel Syndrome Obesity Depression  Past Surgical History: Last updated: 08/19/2008 R leg fx--ORIF Cholecystectomy Hemorrhoidectomy  Family History: Last updated: 09/12/2008 father deceased MVA mother stomach CA age 42 Diabetes- Patient  Social History: Last updated: 09-12-08 Retired Married Regular exercise-no Caffeine- sodas  Risk Factors: Exercise: no (12/30/2007)  Risk Factors: Smoking Status: quit (07/23/2010)  Physical Exam  General:  Well-developed,well-nourished,in no acute distress; alert,appropriate and cooperative throughout examination Head:  normocephalic and atraumatic.   Eyes:  pupils equal and pupils round.   Neck:  No deformities, masses, or tenderness noted. Lungs:  normal respiratory effort and no intercostal retractions.   Heart:  normal rate and regular rhythm.   Abdomen:  Bowel  sounds positive,abdomen soft and non-tender without masses, organomegaly or hernias noted. obese   Impression & Recommendations:  Problem # 1:  OBESITY (ICD-278.00)  almost certainly the cause of most of his medical problems including DM, htn, OA he has been struggling with weight for years has tried multiple diets without success  Problem # 2:  OBSTRUCTIVE SLEEP APNEA (ICD-327.23) using CPAP  Problem # 3:  HYPERTENSION  (ICD-401.9)  His updated medication list for this problem includes:    Amlodipine Besylate 10 Mg Tabs (Amlodipine besylate) .Marland Kitchen... 1 tablet by mouth once a day    Cozaar 100 Mg Tabs (Losartan potassium) .Marland Kitchen... 1 by mouth once daily    Hydrochlorothiazide 25 Mg Tabs (Hydrochlorothiazide) .Marland Kitchen... Take 1 tablet by mouth every morning  BP today: 136/70 Prior BP: 138/66 (06/22/2010)  Labs Reviewed: K+: 4.2 (05/23/2010) Creat: : 1.0 (05/23/2010)   Chol: 159 (05/23/2010)   HDL: 39.90 (05/23/2010)   LDL: 77 (09/14/2009)   TG: 238.0 (05/23/2010)  Problem # 4:  HYPERLIPIDEMIA (ICD-272.4) controlled continue current medications  His updated medication list for this problem includes:    Simvastatin 40 Mg Tabs (Simvastatin) .Marland Kitchen... Take 1 tablet by mouth at bedtime  Labs Reviewed: SGOT: 38 (05/23/2010)   SGPT: 45 (05/23/2010)   HDL:39.90 (05/23/2010), 40.90 (01/09/2010)  LDL:77 (09/14/2009), 90 (04/24/2009)  Chol:159 (05/23/2010), 138 (01/09/2010)  Trig:238.0 (05/23/2010), 209.0 (01/09/2010)  Problem # 5:  DIABETES MELLITUS, TYPE II (ICD-250.00) continue current medications  check labs next visit His updated medication list for this problem includes:    Cozaar 100 Mg Tabs (Losartan potassium) .Marland Kitchen... 1 by mouth once daily    Glucophage 1000 Mg Tabs (Metformin hcl) .Marland Kitchen... Take 1 tablet by mouth twice a day    Lantus 100 Unit/ml Soln (Insulin glargine) .Marland KitchenMarland KitchenMarland KitchenMarland Kitchen 80 subcutaneously once daily hs  Labs Reviewed: Creat: 1.0 (05/23/2010)     Last Eye Exam: normal-pt's report (09/01/2009) Reviewed HgBA1c results: 8.0 (05/23/2010)  7.5 (01/09/2010)  Complete Medication List: 1)  Amlodipine Besylate 10 Mg Tabs (Amlodipine besylate) .Marland Kitchen.. 1 tablet by mouth once a day 2)  Cozaar 100 Mg Tabs (Losartan potassium) .Marland Kitchen.. 1 by mouth once daily 3)  Glucophage 1000 Mg Tabs (Metformin hcl) .... Take 1 tablet by mouth twice a day 4)  Hydrochlorothiazide 25 Mg Tabs (Hydrochlorothiazide) .... Take 1 tablet by mouth every  morning 5)  Prilosec 20 Mg Cpdr (Omeprazole) .... Take 1 capsule by mouth twice a day 6)  Simvastatin 40 Mg Tabs (Simvastatin) .... Take 1 tablet by mouth at bedtime 7)  Levothyroxine Sodium 50 Mcg Tabs (Levothyroxine sodium) .... Take 1 tablet by mouth once a day 8)  Lantus 100 Unit/ml Soln (Insulin glargine) .... 80 subcutaneously once daily hs 9)  Hydrocortisone 2.5 % Lotn (Hydrocortisone) .... Once daily as needed 10)  Onetouch Ultra Test Strp (Glucose blood) .... Use two times a day or as directed 11)  Klor-con M20 20 Meq Cr-tabs (Potassium chloride crys cr) .... Take 1 tablet by mouth once a day 12)  Trazodone Hcl 50 Mg Tabs (Trazodone hcl) .Marland Kitchen.. 1-2 q day 13)  Citalopram Hydrobromide 40 Mg Tabs (Citalopram hydrobromide) .... One by mouth daily

## 2011-01-01 NOTE — Assessment & Plan Note (Signed)
Summary: FASTING LABS/M4A//[facility] rsc per doc/njr   Vital Signs:  Patient Profile:   70 Years Old Male Weight:      252 pounds Temp:     98.3 degrees F oral Pulse rate:   80 / minute BP sitting:   144 / 88  (left arm)  Vitals Entered By: Gladis Riffle, RN (September 24, 2007 9:46 AM)                 Chief Complaint:  ROV, pt fasting--CBG at home 170-180, and BP 107/73 at home--states had flu shot this week at [facility].  History of Present Illness:  Follow-Up Visit:htn, lipids, dm      This is a 70 year old man who presents for Follow-up visit.  The patient denies chest pain, palpitations, dizziness, syncope, low blood sugar symptoms, high blood sugar symptoms, edema, SOB, DOE, PND, and orthopnea.  Since the last visit the patient notes no new problems or concerns.  The patient reports taking meds as prescribed.  When questioned about possible medication side effects, the patient notes none.    Current Allergies (reviewed today): MORPHINE SULFATE (MORPHINE SULFATE)  Past Medical History:    Reviewed history and no changes required:       Diabetes mellitus, type II       GERD       Hyperlipidemia       Hypertension       osa       elevated psa       Diverticulosis, colon     Review of Systems       no other complaints in a complete ROS    Physical Exam  General:     Well-developed,well-nourished,in no acute distress; alert,appropriate and cooperative throughout examination Head:     normocephalic and atraumatic.   Eyes:     pupils equal and pupils round.   Ears:     R ear normal and L ear normal.   Neck:     No deformities, masses, or tenderness noted. Lungs:     Normal respiratory effort, chest expands symmetrically. Lungs are clear to auscultation, no crackles or wheezes. Heart:     Normal rate and regular rhythm. S1 and S2 normal without gallop, murmur, click, rub or other extra sounds. Abdomen:     Bowel sounds positive,abdomen soft and non-tender without masses,  organomegaly or hernias noted. Msk:     No deformity or scoliosis noted of thoracic or lumbar spine.   Extremities:     No clubbing, cyanosis, edema, or deformity noted  Neurologic:     No cranial nerve deficits noted. Station and gait are normal. . Sensory, motor and coordinative functions appear intact.    Complete Medication List: 1)  Amlodipine Besylate 10 Mg Tabs (Amlodipine besylate) .Marland Kitchen.. 1 tablet by mouth once a day 2)  Cozaar 50 Mg Tabs (Losartan potassium) .... Take 1 tablet by mouth once a day 3)  Glipizide 5 Mg Tabs (Glipizide) .... Take 1 tablet by mouth twice a day 4)  Glucophage 1000 Mg Tabs (Metformin hcl) .... Take 1 tablet by mouth twice a day 5)  Hydrochlorothiazide 25 Mg Tabs (Hydrochlorothiazide) .... Take 1 tablet by mouth every morning 6)  Prilosec 20 Mg Cpdr (Omeprazole) .... Take 1 capsule by mouth twice a day 7)  Rhinocort Aqua 32 Mcg/act Susp (Budesonide (nasal)) .... Spray 2 spray into both nostrils once a day 8)  Simvastatin 40 Mg Tabs (Simvastatin) .... Take 1 tablet by mouth  at bedtime 9)  Synthroid 150 Mcg Tabs (Levothyroxine sodium) .... Take 1 tablet by mouth once a day 10)  Lantus 100 Unit/ml Soln (Insulin glargine) .... 20 subcutaneously once daily 11)  Ketoconazole 2 % Sham (Ketoconazole) .... Use 3x weekly 12)  Trixaicin 0.025 % Crea (Capsaicin) .... Use daily 13)  Hydrocortisone 2.5 % Lotn (Hydrocortisone) .... Use 3 x weekly   Patient Instructions: 1)  Please schedule a follow-up appointment in 3 months. 2)  BMP prior to visit, ICD-9: 3)  Hepatic Panel prior to visit, ICD-9: 4)  Lipid Panel prior to visit, ICD-9: 5)  HbgA1C prior to visit, ICD-9: 6)  Urine Microalbumin prior to visit, ICD-9:    ]  Influenza Immunization History:    Influenza # 1:  Historical (09/22/2007)

## 2011-01-01 NOTE — Assessment & Plan Note (Signed)
Summary: 1 month rov/njr   Vital Signs:  Patient profile:   70 year old male Weight:      270 pounds BMI:     36.75 Temp:     98.8 degrees F oral Pulse rate:   88 / minute Pulse rhythm:   regular Resp:     16 per minute BP sitting:   128 / 72  Vitals Entered By: Lynann Beaver CMA AAMA (October 01, 2010 8:20 AM)  Nutrition Counseling: Patient's BMI is greater than 25 and therefore counseled on weight management options. CC: rov Is Patient Diabetic? Yes Pain Assessment Patient in pain? no        Primary Care Provider:  Birdie Sons MD  CC:  rov.  History of Present Illness:  Follow-Up Visit      This is a 70 year old man who presents for Follow-up visit.  The patient denies chest pain and palpitations.  Since the last visit the patient notes no new problems or concerns and being seen by a specialist.  The patient reports taking meds as prescribed.  When questioned about possible medication side effects, the patient notes none.  seeing surgeon for bariatric surgeon  All other systems reviewed and were negative   Current Problems (verified): 1)  Depression  (ICD-311) 2)  Obesity  (ICD-278.00) 3)  Personal Hx Colonic Polyps  (ICD-V12.72) 4)  Erectile Dysfunction  (ICD-302.72) 5)  Obstructive Sleep Apnea  (ICD-327.23) 6)  Psa, Increased  (ICD-790.93) 7)  Diabetic Retinopathy  (ICD-250.50) 8)  Diverticulosis, Colon  (ICD-562.10) 9)  Hypertension  (ICD-401.9) 10)  Hyperlipidemia  (ICD-272.4) 11)  Gerd  (ICD-530.81) 12)  Diabetes Mellitus, Type II  (ICD-250.00)  Current Medications (verified): 1)  Amlodipine Besylate 10 Mg Tabs (Amlodipine Besylate) .Marland Kitchen.. 1 Tablet By Mouth Once A Day 2)  Cozaar 100 Mg Tabs (Losartan Potassium) .Marland Kitchen.. 1 By Mouth Once Daily 3)  Glucophage 1000 Mg Tabs (Metformin Hcl) .... Take 1 Tablet By Mouth Twice A Day 4)  Hydrochlorothiazide 25 Mg Tabs (Hydrochlorothiazide) .... Take 1 Tablet By Mouth Every Morning 5)  Prilosec 20 Mg Cpdr (Omeprazole)  .... Take 1 Capsule By Mouth Twice A Day 6)  Simvastatin 40 Mg Tabs (Simvastatin) .... Take 1 Tablet By Mouth At Bedtime 7)  Levothyroxine Sodium 50 Mcg  Tabs (Levothyroxine Sodium) .... Take 1 Tablet By Mouth Once A Day 8)  Lantus 100 Unit/ml  Soln (Insulin Glargine) .... 80 Subcutaneously Once Daily Hs 9)  Hydrocortisone 2.5 %  Lotn (Hydrocortisone) .... Once Daily As Needed 10)  Freestyle Lite Test  Strp (Glucose Blood) .... Use Two Times Daily As Directed 11)  Klor-Con M20 20 Meq Cr-Tabs (Potassium Chloride Crys Cr) .... Take 1 Tablet By Mouth Once A Day 12)  Trazodone Hcl 50 Mg Tabs (Trazodone Hcl) .Marland Kitchen.. 1-2 Q Day 13)  Citalopram Hydrobromide 40 Mg Tabs (Citalopram Hydrobromide) .... One By Mouth Daily  Allergies (verified): 1)  Morphine Sulfate (Morphine Sulfate)  Past History:  Past Medical History: Last updated: 09/21/2009 Colonic polyps, hx of Diabetes mellitus, type II Diverticulosis, colon GERD Hyperlipidemia Hypertension OSA elevated PSA diabetic retinopathy Hypothyroidism Irritable Bowel Syndrome Obesity Depression  Past Surgical History: Last updated: 08/19/2008 R leg fx--ORIF Cholecystectomy Hemorrhoidectomy  Family History: Last updated: 09/02/08 father deceased MVA mother stomach CA age 59 Diabetes- Patient  Social History: Last updated: 09/02/2008 Retired Married Regular exercise-no Caffeine- sodas  Risk Factors: Exercise: no (12/30/2007)  Risk Factors: Smoking Status: quit (07/23/2010)  Physical Exam  General:  alert and well-developed.   Eyes:  pupils equal and pupils round.   Neck:  No deformities, masses, or tenderness noted. Lungs:  normal respiratory effort and no intercostal retractions.   Heart:  normal rate and regular rhythm.   Abdomen:  Bowel sounds positive,abdomen soft and non-tender without masses, organomegaly or hernias noted. obese Skin:  turgor normal and color normal.   Psych:  good eye contact and not anxious  appearing.    Diabetes Management Exam:    Eye Exam:       Eye Exam done elsewhere          Date: 09/01/2010          Results: diabetic retinopathy          Done by: ophthalomology   Impression & Recommendations:  Problem # 1:  OBESITY (ICD-278.00) has gained weight i agree with gastric bypass  Problem # 2:  HYPERTENSION (ICD-401.9) controlled continue current medications  His updated medication list for this problem includes:    Amlodipine Besylate 10 Mg Tabs (Amlodipine besylate) .Marland Kitchen... 1 tablet by mouth once a day    Cozaar 100 Mg Tabs (Losartan potassium) .Marland Kitchen... 1 by mouth once daily    Hydrochlorothiazide 25 Mg Tabs (Hydrochlorothiazide) .Marland Kitchen... Take 1 tablet by mouth every morning  BP today: 128/72 Prior BP: 120/82 (08/31/2010)  Labs Reviewed: K+: 4.2 (05/23/2010) Creat: : 1.0 (05/23/2010)   Chol: 159 (05/23/2010)   HDL: 39.90 (05/23/2010)   LDL: 77 (09/14/2009)   TG: 238.0 (05/23/2010)  Problem # 3:  HYPERLIPIDEMIA (ICD-272.4)  controlled continue current medications  His updated medication list for this problem includes:    Simvastatin 40 Mg Tabs (Simvastatin) .Marland Kitchen... Take 1 tablet by mouth at bedtime  Labs Reviewed: SGOT: 38 (05/23/2010)   SGPT: 45 (05/23/2010)   HDL:39.90 (05/23/2010), 40.90 (01/09/2010)  LDL:77 (09/14/2009), 90 (04/24/2009)  Chol:159 (05/23/2010), 138 (01/09/2010)  Trig:238.0 (05/23/2010), 209.0 (01/09/2010)  Complete Medication List: 1)  Amlodipine Besylate 10 Mg Tabs (Amlodipine besylate) .Marland Kitchen.. 1 tablet by mouth once a day 2)  Cozaar 100 Mg Tabs (Losartan potassium) .Marland Kitchen.. 1 by mouth once daily 3)  Glucophage 1000 Mg Tabs (Metformin hcl) .... Take 1 tablet by mouth twice a day 4)  Hydrochlorothiazide 25 Mg Tabs (Hydrochlorothiazide) .... Take 1 tablet by mouth every morning 5)  Prilosec 20 Mg Cpdr (Omeprazole) .... Take 1 capsule by mouth twice a day 6)  Simvastatin 40 Mg Tabs (Simvastatin) .... Take 1 tablet by mouth at bedtime 7)  Levothyroxine  Sodium 50 Mcg Tabs (Levothyroxine sodium) .... Take 1 tablet by mouth once a day 8)  Lantus 100 Unit/ml Soln (Insulin glargine) .... 80 subcutaneously once daily hs 9)  Hydrocortisone 2.5 % Lotn (Hydrocortisone) .... Once daily as needed 10)  Freestyle Lite Test Strp (Glucose blood) .... Use two times daily as directed 11)  Klor-con M20 20 Meq Cr-tabs (Potassium chloride crys cr) .... Take 1 tablet by mouth once a day 12)  Trazodone Hcl 50 Mg Tabs (Trazodone hcl) .Marland Kitchen.. 1-2 q day 13)  Citalopram Hydrobromide 40 Mg Tabs (Citalopram hydrobromide) .... One by mouth daily  Patient Instructions: 1)  Please schedule a follow-up appointment in 1 month.   Orders Added: 1)  Est. Patient Level IV [16109]  Appended Document: Orders Update     Clinical Lists Changes  Orders: Added new Service order of Specimen Handling ([id number]) - Signed

## 2011-01-01 NOTE — Letter (Signed)
Summary: [facility]   Imported By: Maryln Gottron 05/28/2010 15:03:44  _____________________________________________________________________  External Attachment:    Type:   Image     Comment:   External Document

## 2011-01-01 NOTE — Progress Notes (Signed)
Summary: test strip refill  Phone Note Refill Request Message from:  Fax from Pharmacy on August 17, 2009 1:45 PM  Refills Requested: Medication #1:  ONETOUCH ULTRA TEST   STRP use two times a day or as directed Initial call taken by: Kern Reap CMA (AAMA),  August 17, 2009 1:45 PM    Prescriptions: ONETOUCH ULTRA TEST   STRP (GLUCOSE BLOOD) use two times a day or as directed  #100 x 11   Entered by:   Kern Reap CMA (AAMA)   Authorized by:   Birdie Sons MD   Signed by:   Kern Reap CMA (AAMA) on 08/17/2009   Method used:   [facility]ed to ...       [facility]       [address]       [place], [place]  [address]       Ph: [phone] or [phone]       Fax: [phone]   RxID:   [id number]

## 2011-01-01 NOTE — Assessment & Plan Note (Signed)
Summary: 1 month fup//ccm   Vital Signs:  Patient profile:   70 year old male Weight:      259 pounds BMI:     35.25 Temp:     98.5 degrees F oral BP sitting:   120 / 82  (left arm) Cuff size:   large  Vitals Entered By: Sid Falcon LPN (August 31, 2010 8:11 AM)  Nutrition Counseling: Patient's BMI is greater than 25 and therefore counseled on weight management options.   Primary Care Provider:  Birdie Sons MD   History of Present Illness:  Follow-Up Visit      This is a 70 year old man who presents for Follow-up visit.  The patient denies chest pain and palpitations.  Since the last visit the patient notes no new problems or concerns.  The patient reports taking meds as prescribed.  When questioned about possible medication side effects, the patient notes none.  he being followed monthly for bariatric surgery.  All other systems reviewed and were negative  able to lose  a minimal amount of weight: 7 pounds   Current Problems (verified): 1)  Knee Pain, Bilateral  (ICD-719.46) 2)  Depression  (ICD-311) 3)  Obesity  (ICD-278.00) 4)  Personal Hx Colonic Polyps  (ICD-V12.72) 5)  Erectile Dysfunction  (ICD-302.72) 6)  Obstructive Sleep Apnea  (ICD-327.23) 7)  Psa, Increased  (ICD-790.93) 8)  Diabetic Retinopathy  (ICD-250.50) 9)  Diverticulosis, Colon  (ICD-562.10) 10)  Hypertension  (ICD-401.9) 11)  Hyperlipidemia  (ICD-272.4) 12)  Gerd  (ICD-530.81) 13)  Diabetes Mellitus, Type II  (ICD-250.00)  Current Medications (verified): 1)  Amlodipine Besylate 10 Mg Tabs (Amlodipine Besylate) .Marland Kitchen.. 1 Tablet By Mouth Once A Day 2)  Cozaar 100 Mg Tabs (Losartan Potassium) .Marland Kitchen.. 1 By Mouth Once Daily 3)  Glucophage 1000 Mg Tabs (Metformin Hcl) .... Take 1 Tablet By Mouth Twice A Day 4)  Hydrochlorothiazide 25 Mg Tabs (Hydrochlorothiazide) .... Take 1 Tablet By Mouth Every Morning 5)  Prilosec 20 Mg Cpdr (Omeprazole) .... Take 1 Capsule By Mouth Twice A Day 6)  Simvastatin 40 Mg  Tabs (Simvastatin) .... Take 1 Tablet By Mouth At Bedtime 7)  Levothyroxine Sodium 50 Mcg  Tabs (Levothyroxine Sodium) .... Take 1 Tablet By Mouth Once A Day 8)  Lantus 100 Unit/ml  Soln (Insulin Glargine) .... 80 Subcutaneously Once Daily Hs 9)  Hydrocortisone 2.5 %  Lotn (Hydrocortisone) .... Once Daily As Needed 10)  Freestyle Lite Test  Strp (Glucose Blood) .... Use Two Times Daily As Directed 11)  Klor-Con M20 20 Meq Cr-Tabs (Potassium Chloride Crys Cr) .... Take 1 Tablet By Mouth Once A Day 12)  Trazodone Hcl 50 Mg Tabs (Trazodone Hcl) .Marland Kitchen.. 1-2 Q Day 13)  Citalopram Hydrobromide 40 Mg Tabs (Citalopram Hydrobromide) .... One By Mouth Daily  Allergies: 1)  Morphine Sulfate (Morphine Sulfate)  Past History:  Past Medical History: Last updated: 09/21/2009 Colonic polyps, hx of Diabetes mellitus, type II Diverticulosis, colon GERD Hyperlipidemia Hypertension OSA elevated PSA diabetic retinopathy Hypothyroidism Irritable Bowel Syndrome Obesity Depression  Past Surgical History: Last updated: 08/19/2008 R leg fx--ORIF Cholecystectomy Hemorrhoidectomy  Family History: Last updated: 2008-09-14 father deceased MVA mother stomach CA age 57 Diabetes- Patient  Social History: Last updated: 2008/09/14 Retired Married Regular exercise-no Caffeine- sodas  Risk Factors: Exercise: no (12/30/2007)  Risk Factors: Smoking Status: quit (07/23/2010)  Physical Exam  General:  alert and well-developed.   Head:  normocephalic and atraumatic.   Eyes:  pupils equal and pupils round.  Neck:  No deformities, masses, or tenderness noted. Chest Wall:  No deformities, masses, tenderness or gynecomastia noted. Lungs:  normal respiratory effort and no intercostal retractions.   Heart:  normal rate and regular rhythm.   Abdomen:  Bowel sounds positive,abdomen soft and non-tender without masses, organomegaly or hernias noted. obese Skin:  turgor normal and color normal.   Psych:   good eye contact and not anxious appearing.     Impression & Recommendations:  Problem # 1:  OBESITY (ICD-278.00) this is clearly his major medical problem likely the cause of his osteoarthritis, DM, htn, GERD and hperlipidemia agree with bariatric evaluation  Problem # 2:  HYPERTENSION (ICD-401.9) controlled continue current medications  His updated medication list for this problem includes:    Amlodipine Besylate 10 Mg Tabs (Amlodipine besylate) .Marland Kitchen... 1 tablet by mouth once a day    Cozaar 100 Mg Tabs (Losartan potassium) .Marland Kitchen... 1 by mouth once daily    Hydrochlorothiazide 25 Mg Tabs (Hydrochlorothiazide) .Marland Kitchen... Take 1 tablet by mouth every morning  BP today: 120/82 Prior BP: 136/70 (07/23/2010)  Labs Reviewed: K+: 4.2 (05/23/2010) Creat: : 1.0 (05/23/2010)   Chol: 159 (05/23/2010)   HDL: 39.90 (05/23/2010)   LDL: 77 (09/14/2009)   TG: 238.0 (05/23/2010)  Problem # 3:  HYPERLIPIDEMIA (ICD-272.4) controlled continue current medications  His updated medication list for this problem includes:    Simvastatin 40 Mg Tabs (Simvastatin) .Marland Kitchen... Take 1 tablet by mouth at bedtime  Labs Reviewed: SGOT: 38 (05/23/2010)   SGPT: 45 (05/23/2010)   HDL:39.90 (05/23/2010), 40.90 (01/09/2010)  LDL:77 (09/14/2009), 90 (04/24/2009)  Chol:159 (05/23/2010), 138 (01/09/2010)  Trig:238.0 (05/23/2010), 209.0 (01/09/2010)  Complete Medication List: 1)  Amlodipine Besylate 10 Mg Tabs (Amlodipine besylate) .Marland Kitchen.. 1 tablet by mouth once a day 2)  Cozaar 100 Mg Tabs (Losartan potassium) .Marland Kitchen.. 1 by mouth once daily 3)  Glucophage 1000 Mg Tabs (Metformin hcl) .... Take 1 tablet by mouth twice a day 4)  Hydrochlorothiazide 25 Mg Tabs (Hydrochlorothiazide) .... Take 1 tablet by mouth every morning 5)  Prilosec 20 Mg Cpdr (Omeprazole) .... Take 1 capsule by mouth twice a day 6)  Simvastatin 40 Mg Tabs (Simvastatin) .... Take 1 tablet by mouth at bedtime 7)  Levothyroxine Sodium 50 Mcg Tabs (Levothyroxine sodium)  .... Take 1 tablet by mouth once a day 8)  Lantus 100 Unit/ml Soln (Insulin glargine) .... 80 subcutaneously once daily hs 9)  Hydrocortisone 2.5 % Lotn (Hydrocortisone) .... Once daily as needed 10)  Freestyle Lite Test Strp (Glucose blood) .... Use two times daily as directed 11)  Klor-con M20 20 Meq Cr-tabs (Potassium chloride crys cr) .... Take 1 tablet by mouth once a day 12)  Trazodone Hcl 50 Mg Tabs (Trazodone hcl) .Marland Kitchen.. 1-2 q day 13)  Citalopram Hydrobromide 40 Mg Tabs (Citalopram hydrobromide) .... One by mouth daily  Other Orders: Flu Vaccine 54yrs + MEDICARE PATIENTS (Q5956) Administration Flu vaccine - MCR (L8756)    Flu Vaccine Consent Questions     Do you have a history of severe allergic reactions to this vaccine? no    Any prior history of allergic reactions to egg and/or gelatin? no    Do you have a sensitivity to the preservative Thimersol? no    Do you have a past history of Guillan-Barre Syndrome? no    Do you currently have an acute febrile illness? no    Have you ever had a severe reaction to latex? no    Vaccine information given and  explained to patient? yes    Are you currently pregnant? no    Lot Number:[id number]   Exp Date:06/01/2011   Site Given  Left Deltoid IMdflu

## 2011-01-01 NOTE — Assessment & Plan Note (Signed)
Summary: cold   Vital Signs:  Patient Profile:   70 Years Old Male Height:     73 inches Weight:      259 pounds Temp:     98.0 degrees F oral Pulse rate:   75 / minute BP sitting:   150 / 88  (left arm) Cuff size:   large  Vitals Entered By: Alfred Levins, CMA (October 13, 2008 9:04 AM)                 PCP:  Birdie Sons MD  Chief Complaint:  cough, st, and achy.  History of Present Illness: Here with his wife for 3 days of stuffy head, ST, HA, dry cough, fevers, and body aches. She has the same symptoms. On Tylenol and fluids. No NVD.    Current Allergies: MORPHINE SULFATE (MORPHINE SULFATE)  Past Medical History:    Reviewed history from 08/19/2008 and no changes required:       Colonic polyps, hx of       Diabetes mellitus, type II       Diverticulosis, colon       GERD       Hyperlipidemia       Hypertension       OSA       elevated PSA       diabetic retinopathy       Hypothyroidism       Irritable Bowel Syndrome       Obesity     Review of Systems  The patient denies anorexia, weight loss, weight gain, vision loss, decreased hearing, hoarseness, chest pain, syncope, dyspnea on exertion, peripheral edema, hemoptysis, abdominal pain, melena, hematochezia, severe indigestion/heartburn, hematuria, incontinence, genital sores, muscle weakness, suspicious skin lesions, transient blindness, difficulty walking, depression, unusual weight change, abnormal bleeding, enlarged lymph nodes, angioedema, breast masses, and testicular masses.     Physical Exam  General:     Well-developed,well-nourished,in no acute distress; alert,appropriate and cooperative throughout examination Head:     Normocephalic and atraumatic without obvious abnormalities. No apparent alopecia or balding. Eyes:     No corneal or conjunctival inflammation noted. EOMI. Perrla. Funduscopic exam benign, without hemorrhages, exudates or papilledema. Vision grossly normal. Ears:     External  ear exam shows no significant lesions or deformities.  Otoscopic examination reveals clear canals, tympanic membranes are intact bilaterally without bulging, retraction, inflammation or discharge. Hearing is grossly normal bilaterally. Nose:     External nasal examination shows no deformity or inflammation. Nasal mucosa are pink and moist without lesions or exudates. Mouth:     Oral mucosa and oropharynx without lesions or exudates.  Teeth in good repair. Neck:     No deformities, masses, or tenderness noted. Lungs:     Normal respiratory effort, chest expands symmetrically. Lungs are clear to auscultation, no crackles or wheezes.    Impression & Recommendations:  Problem # 1:  INFLUENZA (ICD-487.8)  Complete Medication List: 1)  Amlodipine Besylate 10 Mg Tabs (Amlodipine besylate) .Marland Kitchen.. 1 tablet by mouth once a day 2)  Cozaar 100 Mg Tabs (Losartan potassium) .Marland Kitchen.. 1 by mouth once daily 3)  Glipizide 5 Mg Tabs (Glipizide) .... Take 1 tablet by mouth twice a day 4)  Glucophage 1000 Mg Tabs (Metformin hcl) .... Take 1 tablet by mouth twice a day 5)  Hydrochlorothiazide 25 Mg Tabs (Hydrochlorothiazide) .... Take 1 tablet by mouth every morning 6)  Prilosec 20 Mg Cpdr (Omeprazole) .... Take 1 capsule by mouth twice a  day 7)  Simvastatin 40 Mg Tabs (Simvastatin) .... Take 1 tablet by mouth at bedtime 8)  Levothyroxine Sodium 50 Mcg Tabs (Levothyroxine sodium) .... Take 1 tablet by mouth once a day 9)  Lantus 100 Unit/ml Soln (Insulin glargine) .... 60 subcutaneously once daily hs 10)  Hydrocortisone 2.5 % Lotn (Hydrocortisone) .... Once daily as needed 11)  Onetouch Ultra Test Strp (Glucose blood) .... Use two times a day or as directed 12)  Transderm-scop 1.5 Mg Pt72 (Scopolamine base) .... Change patch every 3 days   Patient Instructions: 1)  Please schedule a follow-up appointment as needed.   ]

## 2011-01-01 NOTE — Assessment & Plan Note (Signed)
Summary: RECALL COLON-WANTSD ENDO TOO  Medications Added MIRALAX   POWD (POLYETHYLENE GLYCOL 3350) As per prep  instructions. METOCLOPRAMIDE HCL 10 MG  TABS (METOCLOPRAMIDE HCL) As per prep instructions. DULCOLAX 5 MG  TBEC (BISACODYL) Day before procedure take 2 at 3pm and 2 at 8pm.        History of Present Illness Visit Type: new patient Primary GI MD: Stan Head MD [facility] Primary Provider: Birdie Sons MD Requesting Provider: Birdie Sons MD Chief Complaint: Recall Colon, wants to have Endo also History of Present Illness:   The patient is due for a surveillance olonoscopy, letter was sent  in July of this year. He is having some dysphagia and reflux symptoms and wanted to discuss these as well. Occasional dysphagia to solids and intermittent epigastric pain is occurring. If he doesn't take PPI gets reflux symptoms. Also concerned since mother died of stomach cancer. No tobacco but does use some caffeine. No weight loss, rectal bleeding.  DM has been stable without changes he says. No fever, chills, cough, cold.              Updated Prior Medication List: AMLODIPINE BESYLATE 10 MG TABS (AMLODIPINE BESYLATE) 1 tablet by mouth once a day COZAAR 100 MG TABS (LOSARTAN POTASSIUM) 1 by mouth once daily GLIPIZIDE 5 MG TABS (GLIPIZIDE) Take 1 tablet by mouth twice a day GLUCOPHAGE 1000 MG TABS (METFORMIN HCL) Take 1 tablet by mouth twice a day HYDROCHLOROTHIAZIDE 25 MG TABS (HYDROCHLOROTHIAZIDE) Take 1 tablet by mouth every morning PRILOSEC 20 MG CPDR (OMEPRAZOLE) Take 1 capsule by mouth twice a day SIMVASTATIN 40 MG TABS (SIMVASTATIN) Take 1 tablet by mouth at bedtime LEVOTHYROXINE SODIUM 50 MCG  TABS (LEVOTHYROXINE SODIUM) Take 1 tablet by mouth once a day LANTUS 100 UNIT/ML  SOLN (INSULIN GLARGINE) 50 subcutaneously once daily hs HYDROCORTISONE 2.5 %  LOTN (HYDROCORTISONE) once daily as needed ONETOUCH ULTRA TEST   STRP (GLUCOSE BLOOD) use two times a day or as directed  TRANSDERM-SCOP 1.5 MG  PT72 (SCOPOLAMINE BASE) change patch every 3 days  Current Allergies (reviewed today): MORPHINE SULFATE (MORPHINE SULFATE)  Past Medical History:    Reviewed history from 08/19/2008 and no changes required:       Colonic polyps, hx of       Diabetes mellitus, type II       Diverticulosis, colon       GERD       Hyperlipidemia       Hypertension       OSA       elevated PSA       diabetic retinopathy       Hypothyroidism       Irritable Bowel Syndrome       Obesity  Past Surgical History:    Reviewed history from 08/19/2008 and no changes required:       R leg fx--ORIF       Cholecystectomy       Hemorrhoidectomy   Family History:    Reviewed history from 12/30/2007 and no changes required:       father deceased MVA       mother stomach CA age 66       Diabetes- Patient  Social History:    Reviewed history from 12/30/2007 and no changes required:       Retired       Married       Regular exercise-no       Caffeine- sodas  Review of Systems       DOE up hills and when hot, osteoarthritis and joint stiffness All other ROS negative except as per HPI.    Vital Signs:  Patient Profile:   70 Years Old Male Height:     73 inches Weight:      254 pounds BMI:     33.63 Pulse rate:   72 / minute Pulse rhythm:   regular BP sitting:   136 / 80  (left arm)  Vitals Entered By: Lowry Ram CMA (August 22, 2008 10:48 AM)                  Physical Exam  General:     obese.   Head:     Normocephalic and atraumatic. Eyes:     PERRLA, no icterus. Mouth:     edentulous on top, partial dentures botom Neck:     Supple; no masses or thyromegaly. Lungs:     Clear throughout to auscultation. Heart:     Regular rate and rhythm; no murmurs, rubs,  or bruits. Abdomen:     small diastasis recti soft, NT, no HSM or mass, BS+, non-tender Pulses:     radial normal bilateral Extremities:     no edema Neurologic:     Alert and   oriented x4;  grossly normal neurologically. Skin:     sun damaged areas Cervical Nodes:     No significant cervical or supraclavicular adenopathy.  Psych:     Alert and cooperative. Normal mood and affect.    Impression & Recommendations:  Problem # 1:  DYSPHAGIA (WUX-324.40) Assessment: New He could have a peptic stricture versus incompletely treated reflux or both. Endoscopic evaluation is appropriate. Possible esophageal dilation as discussed.  Risks, benefits,and indications of endoscopic procedure(s) were reviewed with the patient and all questions answered.  Orders: Colon/Endo (Colon/Endo)   Problem # 2:  PERSONAL HX COLONIC POLYPS (ICD-V12.72) Assessment: Comment Only Risks, benefits,and indications of endoscopic procedure(s) were reviewed with the patient and all questions answered.  Orders: Colon/Endo (Colon/Endo)   Problem # 3:  DIABETES MELLITUS, TYPE II (ICD-250.00) Assessment: Comment Only modify meds prior to colonoscpy     Prescriptions: DULCOLAX 5 MG  TBEC (BISACODYL) Day before procedure take 2 at 3pm and 2 at 8pm.  #4 x 0   Entered by:   Paulene Floor, RN   Authorized by:   Iva Boop MD   Signed by:   Paulene Floor, RN on 08/22/2008   Method used:   Faxed to ...       Hospital doctor (mail-order)       [address]       [place], [place]  [address]       Ph: [phone] or [phone]       Fax: [phone]   RxID:   [id number] METOCLOPRAMIDE HCL 10 MG  TABS (METOCLOPRAMIDE HCL) As per prep instructions.  #2 x 0   Entered by:   Paulene Floor, RN   Authorized by:   Iva Boop MD   Signed by:   Paulene Floor, RN on 08/22/2008   Method used:   Faxed to ...       Hospital doctor (mail-order)       [address]       [place], [place]  [address]  Ph: [phone] or [phone]       Fax: [phone]   RxID:   [id number] MIRALAX   POWD (POLYETHYLENE GLYCOL  3350) As per prep  instructions.  #255gm x 0   Entered by:   Paulene Floor, RN   Authorized by:   Iva Boop MD   Signed by:   Paulene Floor, RN on 08/22/2008   Method used:   Faxed to ...       Hospital doctor (mail-order)       [address]       [place], [place]  [address]       Ph: [phone] or [phone]       Fax: [phone]   RxID:   [id number]  ]

## 2011-01-01 NOTE — Progress Notes (Signed)
Summary: faxed last ov note [facility] for Dr. Cato Mulligan.  Phone Note Outgoing Call   Action Taken: Information Sent Summary of Call: need last ov note sent to Dr .Annabell Howells Initial call taken by: Drue Stager,  January 28, 2008 10:26 AM  Follow-up for Phone Call        faxed last ov note to Dr. Annabell Howells for Dr. Cato Mulligan. Follow-up by: Drue Stager,  January 28, 2008 10:28 AM

## 2011-01-01 NOTE — Letter (Signed)
Summary: [facility]  [facility]   Imported By: Maryln Gottron 03/14/2009 15:11:21  _____________________________________________________________________  External Attachment:    Type:   Image     Comment:   External Document

## 2011-01-01 NOTE — Assessment & Plan Note (Signed)
Summary: 4 mo rov/mm   Vital Signs:  Patient profile:   70 year old male Weight:      257 pounds Temp:     98.2 degrees F oral Resp:     16 per minute BP sitting:   152 / 60  Vitals Entered By: Lynann Beaver CMA (September 21, 2009 8:06 AM) CC: rov Is Patient Diabetic? Yes   Primary Care Provider:  Birdie Sons MD  CC:  rov.  History of Present Illness:  Follow-Up Visit      This is a 70 year old man who presents for Follow-up visit.  The patient denies chest pain, palpitations, dizziness, syncope, edema, SOB, DOE, PND, and orthopnea.  Since the last visit the patient notes no new problems or concerns.  The patient reports taking meds as prescribed.  When questioned about possible medication side effects, the patient notes none.  some Upper respiratory allergic sxs CPAP---able to tolerate without difficulty  All other systems reviewed and were negative   Current Problems (verified): 1)  Obesity  (ICD-278.00) 2)  Personal Hx Colonic Polyps  (ICD-V12.72) 3)  Dysphagia  (ICD-787.29) 4)  Erectile Dysfunction  (ICD-302.72) 5)  Obstructive Sleep Apnea  (ICD-327.23) 6)  Psa, Increased  (ICD-790.93) 7)  Diabetic Retinopathy  (ICD-250.50) 8)  Diverticulosis, Colon  (ICD-562.10) 9)  Hypertension  (ICD-401.9) 10)  Hyperlipidemia  (ICD-272.4) 11)  Gerd  (ICD-530.81) 12)  Diabetes Mellitus, Type II  (ICD-250.00)  Current Medications (verified): 1)  Amlodipine Besylate 10 Mg Tabs (Amlodipine Besylate) .Marland Kitchen.. 1 Tablet By Mouth Once A Day 2)  Cozaar 100 Mg Tabs (Losartan Potassium) .Marland Kitchen.. 1 By Mouth Once Daily 3)  Glipizide 5 Mg Tabs (Glipizide) .... Take 1 Tablet By Mouth Twice A Day 4)  Glucophage 1000 Mg Tabs (Metformin Hcl) .... Take 1 Tablet By Mouth Twice A Day 5)  Hydrochlorothiazide 25 Mg Tabs (Hydrochlorothiazide) .... Take 1 Tablet By Mouth Every Morning 6)  Prilosec 20 Mg Cpdr (Omeprazole) .... Take 1 Capsule By Mouth Twice A Day 7)  Simvastatin 40 Mg Tabs (Simvastatin) ....  Take 1 Tablet By Mouth At Bedtime 8)  Levothyroxine Sodium 50 Mcg  Tabs (Levothyroxine Sodium) .... Take 1 Tablet By Mouth Once A Day 9)  Lantus 100 Unit/ml  Soln (Insulin Glargine) .... 80 Subcutaneously Once Daily Hs 10)  Hydrocortisone 2.5 %  Lotn (Hydrocortisone) .... Once Daily As Needed 11)  Onetouch Ultra Test   Strp (Glucose Blood) .... Use Two Times A Day or As Directed 12)  Klor-Con M20 20 Meq Cr-Tabs (Potassium Chloride Crys Cr) .... Take 1 Tablet By Mouth Once A Day 13)  Trazodone Hcl 50 Mg Tabs (Trazodone Hcl) .Marland Kitchen.. 1-2 Q Day 14)  Citalopram Hydrobromide 40 Mg Tabs (Citalopram Hydrobromide) .... One By Mouth Daily  Allergies (verified): 1)  Morphine Sulfate (Morphine Sulfate)  Past History:  Past Surgical History: Last updated: 08/19/2008 R leg fx--ORIF Cholecystectomy Hemorrhoidectomy  Family History: Last updated: 09-03-2008 father deceased MVA mother stomach CA age 51 Diabetes- Patient  Social History: Last updated: 09-03-2008 Retired Married Regular exercise-no Caffeine- sodas  Risk Factors: Exercise: no (12/30/2007)  Risk Factors: Smoking Status: quit (09/24/2007)  Past Medical History: Colonic polyps, hx of Diabetes mellitus, type II Diverticulosis, colon GERD Hyperlipidemia Hypertension OSA elevated PSA diabetic retinopathy Hypothyroidism Irritable Bowel Syndrome Obesity Depression  Physical Exam  General:  Well-developed,well-nourished,in no acute distress; alert,appropriate and cooperative throughout examination Head:  normocephalic and atraumatic.   Eyes:  pupils equal and pupils round.  Ears:  R ear normal and L ear normal.   Neck:  No deformities, masses, or tenderness noted. Chest Wall:  No deformities, masses, tenderness or gynecomastia noted. Lungs:  Normal respiratory effort, chest expands symmetrically. Lungs are clear to auscultation, no crackles or wheezes. Heart:  Normal rate and regular rhythm. S1 and S2 normal without  gallop, murmur, click, rub or other extra sounds. Abdomen:  Bowel sounds positive,abdomen soft and non-tender without masses, organomegaly or hernias noted. obese Msk:  No deformity or scoliosis noted of thoracic or lumbar spine.   Pulses:  R radial normal and L radial normal.   Neurologic:  cranial nerves II-XII intact and gait normal---with cane Skin:  turgor normal and color normal.   Psych:  normally interactive and good eye contact.    Diabetes Management Exam:    Eye Exam:       Eye Exam done elsewhere          Date: 09/01/2009          Results: normal-pt's report          [facility]   Impression & Recommendations:  Problem # 1:  OBSTRUCTIVE SLEEP APNEA (ICD-327.23) much improved with CPAP  Problem # 2:  DIABETIC  RETINOPATHY (ICD-250.50) pt states normal eye exa---he will get report for me His updated medication list for this problem includes:    Cozaar 100 Mg Tabs (Losartan potassium) .Marland Kitchen... 1 by mouth once daily    Glipizide 5 Mg Tabs (Glipizide) .Marland Kitchen... Take 1 tablet by mouth twice a day    Glucophage 1000 Mg Tabs (Metformin hcl) .Marland Kitchen... Take 1 tablet by mouth twice a day    Lantus 100 Unit/ml Soln (Insulin glargine) .Marland KitchenMarland KitchenMarland KitchenMarland Kitchen 80 subcutaneously once daily hs  Labs Reviewed: Creat: 1.0 (09/14/2009)     Last Eye Exam: normal-pt's report (09/01/2009) Reviewed HgBA1c results: 6.8 (09/14/2009)  6.8 (04/24/2009)  Problem # 3:  GERD (ICD-530.81)  well controlled continue current medications  His updated medication list for this problem includes:    Prilosec 20 Mg Cpdr (Omeprazole) .Marland Kitchen... Take 1 capsule by mouth twice a day  His updated medication list for this problem includes:    Prilosec 20 Mg Cpdr (Omeprazole) .Marland Kitchen... Take 1 capsule by mouth twice a day  Problem # 4:  DEPRESSION (ICD-311) well controlled continue current medications  His updated medication list for this problem includes:    Trazodone Hcl 50 Mg Tabs (Trazodone hcl) .Marland Kitchen... 1-2 q day     Citalopram Hydrobromide 40 Mg Tabs (Citalopram hydrobromide) ..... One by mouth daily  Problem # 5:  OBESITY (ICD-278.00) this is clearly his most concerning problem he understands need to lose weight: diet , exercise  Complete Medication List: 1)  Amlodipine Besylate 10 Mg Tabs (Amlodipine besylate) .Marland Kitchen.. 1 tablet by mouth once a day 2)  Cozaar 100 Mg Tabs (Losartan potassium) .Marland Kitchen.. 1 by mouth once daily 3)  Glipizide 5 Mg Tabs (Glipizide) .... Take 1 tablet by mouth twice a day 4)  Glucophage 1000 Mg Tabs (Metformin hcl) .... Take 1 tablet by mouth twice a day 5)  Hydrochlorothiazide 25 Mg Tabs (Hydrochlorothiazide) .... Take 1 tablet by mouth every morning 6)  Prilosec 20 Mg Cpdr (Omeprazole) .... Take 1 capsule by mouth twice a day 7)  Simvastatin 40 Mg Tabs (Simvastatin) .... Take 1 tablet by mouth at bedtime 8)  Levothyroxine Sodium 50 Mcg Tabs (Levothyroxine sodium) .... Take 1 tablet by mouth once a day 9)  Lantus 100  Unit/ml Soln (Insulin glargine) .... 80 subcutaneously once daily hs 10)  Hydrocortisone 2.5 % Lotn (Hydrocortisone) .... Once daily as needed 11)  Onetouch Ultra Test Strp (Glucose blood) .... Use two times a day or as directed 12)  Klor-con M20 20 Meq Cr-tabs (Potassium chloride crys cr) .... Take 1 tablet by mouth once a day 13)  Trazodone Hcl 50 Mg Tabs (Trazodone hcl) .Marland Kitchen.. 1-2 q day 14)  Citalopram Hydrobromide 40 Mg Tabs (Citalopram hydrobromide) .... One by mouth daily  Preventive Care Screening  Last Flu Shot:    Date:  09/01/2009    Results:  given    Patient Instructions: 1)  Please schedule a follow-up appointment in 4 months. 2)  labs one week prior to visit 3)  lipids---272.4 4)  lfts-995.2 5)  bmet-995.2 6)  A1C-250.02 7)      Preventive Care Screening  Last Flu Shot:    Date:  09/01/2009    Results:  given

## 2011-01-01 NOTE — Letter (Signed)
Summary: [facility]:DR WRENN  [facility]   Imported By: Job Founds 03/16/2008 09:32:03  _____________________________________________________________________  External Attachment:    Type:   Image     Comment:   [facility]

## 2011-01-01 NOTE — Progress Notes (Signed)
Summary: xray results and question  Phone Note Call from Patient   Caller: Patient Call For: Birdie Sons MD Summary of Call: wants xray results heard on news that requirements for lap-band surgery has changed- they lowered the BMI; wants to be re-submitted for surgery ph- [phone] Initial call taken by: Raechel Ache, RN,  January 18, 2010 8:14 AM  Follow-up for Phone Call        he can call surgery group but he may be outside of appropriate age range Follow-up by: Birdie Sons MD,  January 18, 2010 2:58 PM  Additional Follow-up for Phone Call Additional follow up Details #1::        Left message to inform pt.  see append of xray Additional Follow-up by: Gladis Riffle, RN,  January 18, 2010 3:13 PM

## 2011-01-01 NOTE — Letter (Signed)
Summary: [facility]  [facility]   Imported By: Maryln Gottron 09/27/2008 15:09:00  _____________________________________________________________________  External Attachment:    Type:   Image     Comment:   External Document

## 2011-01-01 NOTE — Progress Notes (Signed)
Summary: Pt says [facility] is req copy of sleep apnea test  Phone Note Call from Patient Call back at Home Phone [phone]   Caller: Patient Summary of Call: Pt called and said that [facility] is req a copy of pts sleep apnea test. Please send to them asap.  Initial call taken by: Lucy Antigua,  March 19, 2010 10:42 AM  Follow-up for Phone Call        sleep study not in echart.  Pt states was done at [facility] several years ago.  Have asked to have charted sent from [facility] charts to locate. Follow-up by: Gladis Riffle, RN,  March 19, 2010 2:33 PM  Additional Follow-up for Phone Call Additional follow up Details #1::        sleep study will be sent to [facility] and scanned to chart.Patient notified.  Additional Follow-up by: Gladis Riffle, RN,  March 20, 2010 12:33 PM

## 2011-01-01 NOTE — Consult Note (Signed)
[facility]   Imported By: Kassie Mends 05/10/2008 15:32:47  _____________________________________________________________________  External Attachment:    Type:   Image     Comment:   [facility]

## 2011-01-01 NOTE — Procedures (Signed)
Summary: Colonoscopy   Colonoscopy  Procedure date:  09/28/2008  Findings:      Location:  [facility].    Procedures Next Due Date:    Colonoscopy: 10/2013  Patient Name: David, Hoover MRN:  Procedure Procedures: Colonoscopy CPT: 75643.    with polypectomy. CPT: A3573898.  Personnel: Endoscopist: Iva Boop, MD, [facility].  Exam Location: Exam performed in Outpatient Clinic. Outpatient  Patient Consent: Procedure, Alternatives, Risks and Benefits discussed, consent obtained, from patient. Consent was obtained by the RN.  Indications  Surveillance of: Adenomatous Polyp(s). This is not an initial surveillance exam. Initial polypectomy was performed in 1998. in Dec. 3 or more Polyps were found at Index Exam. Largest polyp removed was 1 to 5 mm. Pathology of worst  polyp: tubular adenoma. Previous surveillance exam(s) in  2004, History  Current Medications: Patient is not currently taking Coumadin.  Allergies: Allergic to MORPHINE.  Pre-Exam Physical: Performed Sep 28, 2008. Cardio-pulmonary exam, Rectal exam, HEENT exam , Abdominal exam, Mental status exam WNL. Abnormal PE findings include: obese, prostate smooth but enlarged, right>left lobe .  Comments: Pt. history reviewed/updated, physical exam performed prior to initiation of sedation? Exam Exam: Extent of exam reached: Cecum, extent intended: Cecum.  The cecum was identified by appendiceal orifice and IC valve. Patient position: on left side. Time to Cecum: 00:01:30. Time for Withdrawl: 00:09:18. Colon retroflexion performed. Images taken. ASA Classification: II. Tolerance: excellent.  Monitoring: Pulse and BP monitoring, Oximetry used. Supplemental O2 given.  Colon Prep Used MiraLax for colon prep. Prep results: excellent.  Sedation Meds: Patient assessed and found to be appropriate for moderate (conscious) sedation. Fentanyl 25 mcg. given IV. Versed 2 mg. given IV.  Findings - NORMAL  EXAM: Ascending Colon to Descending Colon.  - DIVERTICULOSIS: Sigmoid Colon.  POLYP: Cecum, Maximum size: 5 mm. sessile polyp. Procedure:  snare without cautery, removed, retrieved, Polyp sent to pathology. Path # 1.  HEMORRHOIDS: Internal. Size: Grade I.   Assessment  Comments: 1) ONE 5 MM CECAL POLYP REMOVED 2) SIGMOID DIBERTICULOSIS (MILD) 3) SMALL INTERNAL HEMORRHOIDS 4) OTHERWISE NORMAL COLONOSCOPY, EXCELLENT PREP 5) PRIOR ADENOMATOUS COLON POLYPS 6) ENLARGED PROSTATE (KNOWN BPH) Events  Unplanned Interventions: No intervention was required.  Plans Medication Plan: Await pathology.  Patient Education: Patient given standard instructions for: Polyps. Diverticulosis. Hemorrhoids.  Disposition: After procedure patient sent to recovery. After recovery patient sent home.  Scheduling/Referral: Await pathology to schedule patient. Path Letter, to The Patient,     cc.   The Patient   REPORT OF SURGICAL PATHOLOGY   Case #: [id number] Patient Name: David, Hoover. Office Chart Number:  [id number]   MRN: [id number] Pathologist: Ferd Hibbs. Colonel Bald, MD DOB/Age  [birth year] (Age: 70)    Gender: M Date Taken:  09/28/2008 Date Received: 09/28/2008   FINAL DIAGNOSIS   ***MICROSCOPIC EXAMINATION AND DIAGNOSIS***   CECUM, POLYP: -  INFLAMED HYPERPLASTIC POLYP. -  THERE IS NO EVIDENCE OF MALIGNANCY.   mw Date Reported:  09/29/2008     Ivin Booty B. Colonel Bald, MD *** Electronically Signed Out By [facility] ***     September 30, 2008 MRN: [id number]    [name] [address] [place], [place]  [address]    [name],  I am pleased to inform you that the colon polyp removed during your recent colonoscopy was found to be benign (no cancer detected) upon pathologic examination.  I recommend you have a repeat colonoscopy examination in 5 years to look for recurrent polyps, as  having colon polyps increases your risk for having recurrent polyps or even colon cancer in the future.   Should you develop new or worsening symptoms of abdominal pain, bowel habit changes or bleeding from the rectum or bowels, please schedule an evaluation with either your primary care physician or with me.   Please call us if you are having persistent problems or have questions about your condition that have not been fully answered at this time.    Sincerely,  Iva Boop MD  This letter has been electronically signed by your physician.   Signed by Iva Boop MD on 09/30/2008 at 8:02 AM  ________________________________________________________________________ This report was created from the original endoscopy report, which was reviewed and signed by the above listed endoscopist.

## 2011-01-01 NOTE — Consult Note (Signed)
[facility]   Imported By: Kassie Mends 01/12/2008 09:43:59  _____________________________________________________________________  External Attachment:    Type:   Image     Comment:   [facility]

## 2011-01-01 NOTE — Progress Notes (Signed)
Summary: faxed a ekg to [facility].  Phone Note Other Incoming   Call placed by: Okey Regal from [facility]  Call placed to: medical records Action Taken: Phone Call Completed Summary of Call: need a ekg on [name] faxed to [facility]. Initial call taken by: Drue Stager,  January 28, 2008 11:57 AM  Follow-up for Phone Call        Phone call completed , faxed a ekg to [place]  at [facility]  on [name]. Follow-up by: Drue Stager,  January 28, 2008 12:00 PM

## 2011-01-01 NOTE — Letter (Signed)
Summary: [facility]  [facility]   Imported By: Maryln Gottron 03/30/2010 13:05:14  _____________________________________________________________________  External Attachment:    Type:   Image     Comment:   External Document

## 2011-01-01 NOTE — Progress Notes (Signed)
Summary: needs surgical clearance   Phone Note From Other Clinic Call back at [phone]   Caller: Pam from Dr Annabell Howells Call For: [facility] Summary of Call: Need surgical clearance for turp surgery.  fax [phone] Initial call taken by: Roselle Locus,  January 22, 2008 1:33 PM  Follow-up for Phone Call        schedule OV----NO URGENCY Follow-up by: Birdie Sons MD,  January 22, 2008 6:52 PM  Additional Follow-up for Phone Call Additional follow up Details #1::        PT WILL COME TOMORROW AT 3:20 PER DR. Additional Follow-up by: Warnell Forester,  January 25, 2008 9:27 AM

## 2011-01-01 NOTE — Assessment & Plan Note (Signed)
Summary: rov/mm   Vital Signs:  Patient Profile:   70 Years Old Male Height:     72.5 inches Weight:      250 pounds BMI:     33.56 Temp:     98.4 degrees F Pulse rate:   68 / minute BP sitting:   120 / 72  (left arm)  Vitals Entered By: Gladis Riffle, RN (December 30, 2008 7:54 AM)               Vision Comments: 08/2009   PCP:  Birdie Sons MD  Chief Complaint:  rov and CBGs 72-120 at home.  History of Present Illness:  Follow-Up Visit      This is a 70 year old man who presents for Follow-up visit.  The patient denies chest pain, palpitations, dizziness, syncope, low blood sugar symptoms, high blood sugar symptoms, edema, SOB, DOE, PND, and orthopnea.  Since the last visit the patient notes no new problems or concerns.  The patient reports taking meds as prescribed, not monitoring BP, and monitoring blood sugars.  When questioned about possible medication side effects, the patient notes none.    Past Medical History: Colonic polyps, hx of Diabetes mellitus, type II Diverticulosis, colon GERD Hyperlipidemia Hypertension OSA elevated PSA diabetic retinopathy Hypothyroidism Irritable Bowel Syndrome Obesity  Past Surgical History: R leg fx--ORIF Cholecystectomy Hemorrhoidectomy  Social History: Retired Married Regular exercise-no Caffeine- sodas  Family History: father deceased MVA mother stomach CA age 69 Diabetes- Patient no other complaints in a complete ROS     Updated Prior Medication List: AMLODIPINE BESYLATE 10 MG TABS (AMLODIPINE BESYLATE) 1 tablet by mouth once a day COZAAR 100 MG TABS (LOSARTAN POTASSIUM) 1 by mouth once daily GLIPIZIDE 5 MG TABS (GLIPIZIDE) Take 1 tablet by mouth twice a day GLUCOPHAGE 1000 MG TABS (METFORMIN HCL) Take 1 tablet by mouth twice a day HYDROCHLOROTHIAZIDE 25 MG TABS (HYDROCHLOROTHIAZIDE) Take 1 tablet by mouth every morning PRILOSEC 20 MG CPDR (OMEPRAZOLE) Take 1 capsule by mouth twice a day SIMVASTATIN 40 MG TABS  (SIMVASTATIN) Take 1 tablet by mouth at bedtime LEVOTHYROXINE SODIUM 50 MCG  TABS (LEVOTHYROXINE SODIUM) Take 1 tablet by mouth once a day LANTUS 100 UNIT/ML  SOLN (INSULIN GLARGINE) 80 subcutaneously once daily hs HYDROCORTISONE 2.5 %  LOTN (HYDROCORTISONE) once daily as needed ONETOUCH ULTRA TEST   STRP (GLUCOSE BLOOD) use two times a day or as directed KLOR-CON M20 20 MEQ CR-TABS (POTASSIUM CHLORIDE CRYS CR) Take 1 tablet by mouth once a day  Current Allergies (reviewed today): MORPHINE SULFATE (MORPHINE SULFATE)      Physical Exam  General:     Well-developed,well-nourished,in no acute distress; alert,appropriate and cooperative throughout examination Head:     Normocephalic and atraumatic without obvious abnormalities. No apparent alopecia or balding. Eyes:     pupils equal and pupils round.   Ears:     R ear normal and L ear normal.   Nose:     no external deformity and no external erythema.   Neck:     No deformities, masses, or tenderness noted. Chest Wall:     No deformities, masses, tenderness or gynecomastia noted. Lungs:     Normal respiratory effort, chest expands symmetrically. Lungs are clear to auscultation, no crackles or wheezes. Heart:     Normal rate and regular rhythm. S1 and S2 normal without gallop, murmur, click, rub or other extra sounds. Abdomen:     Bowel sounds positive,abdomen soft and non-tender without masses, organomegaly or  hernias noted. obese Msk:     No deformity or scoliosis noted of thoracic or lumbar spine.   Pulses:     R radial normal and L radial normal.   Neurologic:     cranial nerves II-XII intact and gait normal---with cane Skin:     turgor normal and color normal.   Psych:     good eye contact and not anxious appearing.    Diabetes Management Exam:    Eye Exam:       Eye Exam done elsewhere          Date: 08/02/2008          Results: diabetic retinopathy          Done by: ophthalmology    Impression &  Recommendations:  Problem # 1:  DIABETIC  RETINOPATHY (ICD-250.50) has q 6 month f/u His updated medication list for this problem includes:    Cozaar 100 Mg Tabs (Losartan potassium) .Marland Kitchen... 1 by mouth once daily    Glipizide 5 Mg Tabs (Glipizide) .Marland Kitchen... Take 1 tablet by mouth twice a day    Glucophage 1000 Mg Tabs (Metformin hcl) .Marland Kitchen... Take 1 tablet by mouth twice a day    Lantus 100 Unit/ml Soln (Insulin glargine) .Marland KitchenMarland KitchenMarland KitchenMarland Kitchen 80 subcutaneously once daily hs  Labs Reviewed: HgBA1c: 7.8 (08/23/2008)   Creat: 0.9 (08/23/2008)   Microalbumin: 0.4 (12/24/2007)  Last Eye Exam: diabetic retinopathy (08/02/2008)  Orders: Venipuncture ([address])   Problem # 2:  OBSTRUCTIVE SLEEP APNEA (ICD-327.23) uses CPAP with success     Problem # 3:  HYPERTENSION (ICD-401.9) continue current medications  His updated medication list for this problem includes:    Amlodipine Besylate 10 Mg Tabs (Amlodipine besylate) .Marland Kitchen... 1 tablet by mouth once a day    Cozaar 100 Mg Tabs (Losartan potassium) .Marland Kitchen... 1 by mouth once daily    Hydrochlorothiazide 25 Mg Tabs (Hydrochlorothiazide) .Marland Kitchen... Take 1 tablet by mouth every morning  BP today: 120/72 Prior BP: 134/80 (10/25/2008)  Labs Reviewed: Creat: 0.9 (08/23/2008) Chol: 148 (08/23/2008)   HDL: 34.9 (08/23/2008)   LDL: 78 (08/23/2008)   TG: 176 (08/23/2008)   Problem # 4:  HYPERLIPIDEMIA (ICD-272.4) continue current medications  His updated medication list for this problem includes:    Simvastatin 40 Mg Tabs (Simvastatin) .Marland Kitchen... Take 1 tablet by mouth at bedtime  Labs Reviewed: Chol: 148 (08/23/2008)   HDL: 34.9 (08/23/2008)   LDL: 78 (08/23/2008)   TG: 176 (08/23/2008) SGOT: 36 (08/23/2008)   SGPT: 47 (08/23/2008)   Problem # 5:  OBESITY (ICD-278.00) patient has tried multiple attempts at dieting including the consultation of nutritionists--all attempts unsuccessful  Complete Medication List: 1)  Amlodipine Besylate 10 Mg Tabs (Amlodipine besylate) .Marland Kitchen.. 1  tablet by mouth once a day 2)  Cozaar 100 Mg Tabs (Losartan potassium) .Marland Kitchen.. 1 by mouth once daily 3)  Glipizide 5 Mg Tabs (Glipizide) .... Take 1 tablet by mouth twice a day 4)  Glucophage 1000 Mg Tabs (Metformin hcl) .... Take 1 tablet by mouth twice a day 5)  Hydrochlorothiazide 25 Mg Tabs (Hydrochlorothiazide) .... Take 1 tablet by mouth every morning 6)  Prilosec 20 Mg Cpdr (Omeprazole) .... Take 1 capsule by mouth twice a day 7)  Simvastatin 40 Mg Tabs (Simvastatin) .... Take 1 tablet by mouth at bedtime 8)  Levothyroxine Sodium 50 Mcg Tabs (Levothyroxine sodium) .... Take 1 tablet by mouth once a day 9)  Lantus 100 Unit/ml Soln (Insulin glargine) .... 80 subcutaneously once daily hs 10)  Hydrocortisone 2.5 % Lotn (Hydrocortisone) .... Once daily as needed 11)  Onetouch Ultra Test Strp (Glucose blood) .... Use two times a day or as directed 12)  Klor-con M20 20 Meq Cr-tabs (Potassium chloride crys cr) .... Take 1 tablet by mouth once a day  Other Orders: TLB-A1C / Hgb A1C (Glycohemoglobin) (83036-A1C) TLB-BMP (Basic Metabolic Panel-BMET) (80048-METABOL) TLB-Lipid Panel (80061-LIPID) TLB-ALT (SGPT) (84460-ALT) TLB-AST (SGOT) (84450-SGOT)   Patient Instructions: 1)  Please schedule a follow-up appointment in 4 months. 2)  labs one week prior to visit 3)  lipids---272.4 4)  lfts-995.2 5)  bmet-995.2 6)  A1C-250.02 7)

## 2011-01-01 NOTE — Progress Notes (Signed)
Summary: cpap machine  Phone Note From Other Clinic   Caller: [facility] Call For: Dr. Cato Mulligan Summary of Call: Pt is asking for a new Cpap machine, and needs faxed order. [phone] [place] Initial call taken by: Lynann Beaver CMA,  August 24, 2009 3:01 PM  Follow-up for Phone Call        ok Follow-up by: Birdie Sons MD,  August 25, 2009 1:55 PM  Additional Follow-up for Phone Call Additional follow up Details #1::        Phone call completed Additional Follow-up by: Rudy Jew, RN,  August 25, 2009 2:10 PM     Appended Document: cpap machine Left message to get fax number.

## 2011-01-01 NOTE — Medication Information (Signed)
Summary: Order for CPAP Supplies/[facility]  Order for CPAP Supplies/[facility]   Imported By: Maryln Gottron 03/01/2010 11:02:06  _____________________________________________________________________  External Attachment:    Type:   Image     Comment:   External Document

## 2011-01-01 NOTE — Consult Note (Signed)
Summary: [facility] note  [facility]   Imported By: Kassie Mends 02/03/2008 10:00:52  _____________________________________________________________________  External Attachment:    Type:   Image     Comment:   [facility]

## 2011-01-01 NOTE — Assessment & Plan Note (Signed)
Summary: talk to the doctor//mhf   Vital Signs:  Patient Profile:   70 Years Old Male Height:     73 inches Temp:     98.5 degrees F oral Pulse rate:   74 / minute Pulse rhythm:   regular BP sitting:   134 / 80  (left arm) Cuff size:   large                 Chief Complaint:  wants to lose weight.    Prior Medications Reviewed Using: Patient Recall  Prior Medication List:  AMLODIPINE BESYLATE 10 MG TABS (AMLODIPINE BESYLATE) 1 tablet by mouth once a day COZAAR 100 MG TABS (LOSARTAN POTASSIUM) 1 by mouth once daily GLIPIZIDE 5 MG TABS (GLIPIZIDE) Take 1 tablet by mouth twice a day GLUCOPHAGE 1000 MG TABS (METFORMIN HCL) Take 1 tablet by mouth twice a day HYDROCHLOROTHIAZIDE 25 MG TABS (HYDROCHLOROTHIAZIDE) Take 1 tablet by mouth every morning PRILOSEC 20 MG CPDR (OMEPRAZOLE) Take 1 capsule by mouth twice a day SIMVASTATIN 40 MG TABS (SIMVASTATIN) Take 1 tablet by mouth at bedtime LEVOTHYROXINE SODIUM 50 MCG  TABS (LEVOTHYROXINE SODIUM) Take 1 tablet by mouth once a day LANTUS 100 UNIT/ML  SOLN (INSULIN GLARGINE) 60 subcutaneously once daily hs HYDROCORTISONE 2.5 %  LOTN (HYDROCORTISONE) once daily as needed ONETOUCH ULTRA TEST   STRP (GLUCOSE BLOOD) use two times a day or as directed TRANSDERM-SCOP 1.5 MG  PT72 (SCOPOLAMINE BASE) change patch every 3 days   Current Allergies (reviewed today): MORPHINE SULFATE (MORPHINE SULFATE)        Impression & Recommendations:  Problem # 1:  DIABETES MELLITUS, TYPE II (ICD-250.00) obesity is main issue His updated medication list for this problem includes:    Cozaar 100 Mg Tabs (Losartan potassium) .Marland Kitchen... 1 by mouth once daily    Glipizide 5 Mg Tabs (Glipizide) .Marland Kitchen... Take 1 tablet by mouth twice a day    Glucophage 1000 Mg Tabs (Metformin hcl) .Marland Kitchen... Take 1 tablet by mouth twice a day    Lantus 100 Unit/ml Soln (Insulin glargine) .Marland KitchenMarland KitchenMarland KitchenMarland Kitchen 60 subcutaneously once daily hs  Labs Reviewed: HgBA1c: 7.8 (08/23/2008)   Creat: 0.9  (08/23/2008)   Microalbumin: 0.4 (12/24/2007)   Problem # 2:  HYPERLIPIDEMIA (ICD-272.4)  His updated medication list for this problem includes:    Simvastatin 40 Mg Tabs (Simvastatin) .Marland Kitchen... Take 1 tablet by mouth at bedtime  Labs Reviewed: Chol: 148 (08/23/2008)   HDL: 34.9 (08/23/2008)   LDL: 78 (08/23/2008)   TG: 176 (08/23/2008) SGOT: 36 (08/23/2008)   SGPT: 47 (08/23/2008)   Problem # 3:  HYPERTENSION (ICD-401.9)  His updated medication list for this problem includes:    Amlodipine Besylate 10 Mg Tabs (Amlodipine besylate) .Marland Kitchen... 1 tablet by mouth once a day    Cozaar 100 Mg Tabs (Losartan potassium) .Marland Kitchen... 1 by mouth once daily    Hydrochlorothiazide 25 Mg Tabs (Hydrochlorothiazide) .Marland Kitchen... Take 1 tablet by mouth every morning  BP today: 134/80 Prior BP: 150/88 (10/13/2008)  Labs Reviewed: Creat: 0.9 (08/23/2008) Chol: 148 (08/23/2008)   HDL: 34.9 (08/23/2008)   LDL: 78 (08/23/2008)   TG: 176 (08/23/2008)   Problem # 4:  OBESITY (ICD-278.00) he desperately needs to lose weight interested in surgical options----I would prefer that he approach weight loss with diet and exercise referred to nutritionist he may call surgeon's office anyway  25 min discussion  Complete Medication List: 1)  Amlodipine Besylate 10 Mg Tabs (Amlodipine besylate) .Marland Kitchen.. 1 tablet by mouth once a day  2)  Cozaar 100 Mg Tabs (Losartan potassium) .Marland Kitchen.. 1 by mouth once daily 3)  Glipizide 5 Mg Tabs (Glipizide) .... Take 1 tablet by mouth twice a day 4)  Glucophage 1000 Mg Tabs (Metformin hcl) .... Take 1 tablet by mouth twice a day 5)  Hydrochlorothiazide 25 Mg Tabs (Hydrochlorothiazide) .... Take 1 tablet by mouth every morning 6)  Prilosec 20 Mg Cpdr (Omeprazole) .... Take 1 capsule by mouth twice a day 7)  Simvastatin 40 Mg Tabs (Simvastatin) .... Take 1 tablet by mouth at bedtime 8)  Levothyroxine Sodium 50 Mcg Tabs (Levothyroxine sodium) .... Take 1 tablet by mouth once a day 9)  Lantus 100 Unit/ml  Soln (Insulin glargine) .... 60 subcutaneously once daily hs 10)  Hydrocortisone 2.5 % Lotn (Hydrocortisone) .... Once daily as needed 11)  Onetouch Ultra Test Strp (Glucose blood) .... Use two times a day or as directed 12)  Transderm-scop 1.5 Mg Pt72 (Scopolamine base) .... Change patch every 3 days    ]

## 2011-01-01 NOTE — Assessment & Plan Note (Signed)
Summary: ROA/FUP/QUESTIONS LAB BAND SURGERY/lipids/lfts/bmet/RCD   Vital Signs:  Patient profile:   70 year old male Height:      72 inches Weight:      264 pounds BMI:     35.93 Temp:     98.6 degrees F oral Pulse rate:   72 / minute Pulse rhythm:   regular Resp:     14 per minute BP sitting:   140 / 78  (left arm) Cuff size:   regular  Vitals Entered By: Gladis Riffle, RN (May 23, 2010 8:03 AM)  Nutrition Counseling: Patient's BMI is greater than 25 and therefore counseled on weight management options. CC: discuss lap band surgery, fasting Is Patient Diabetic? Yes Did you bring your meter with you today? No Comments CBGs 125 fasting at home   Primary Care Provider:  Birdie Sons MD  CC:  discuss lap band surgery and fasting.  History of Present Illness:  Follow-Up Visit      This is a 70 year old man who presents for Follow-up visit.  The patient denies chest pain, palpitations, and dizziness.  Since the last visit the patient notes no new problems or concerns.  The patient reports taking meds as prescribed.  When questioned about possible medication side effects, the patient notes none.  Being evaluated for weight loss surgery (Dr. Daphine Deutscher).   All other systems reviewed and were negative   Preventive Screening-Counseling & Management  Alcohol-Tobacco     Smoking Status: quit     Year Quit: 1990  Current Problems (verified): 1)  Knee Pain, Bilateral  (ICD-719.46) 2)  Depression  (ICD-311) 3)  Obesity  (ICD-278.00) 4)  Personal Hx Colonic Polyps  (ICD-V12.72) 5)  Erectile Dysfunction  (ICD-302.72) 6)  Obstructive Sleep Apnea  (ICD-327.23) 7)  Psa, Increased  (ICD-790.93) 8)  Diabetic Retinopathy  (ICD-250.50) 9)  Diverticulosis, Colon  (ICD-562.10) 10)  Hypertension  (ICD-401.9) 11)  Hyperlipidemia  (ICD-272.4) 12)  Gerd  (ICD-530.81) 13)  Diabetes Mellitus, Type II  (ICD-250.00)  Current Medications (verified): 1)  Amlodipine Besylate 10 Mg Tabs (Amlodipine  Besylate) .Marland Kitchen.. 1 Tablet By Mouth Once A Day 2)  Cozaar 100 Mg Tabs (Losartan Potassium) .Marland Kitchen.. 1 By Mouth Once Daily 3)  Glucophage 1000 Mg Tabs (Metformin Hcl) .... Take 1 Tablet By Mouth Twice A Day 4)  Hydrochlorothiazide 25 Mg Tabs (Hydrochlorothiazide) .... Take 1 Tablet By Mouth Every Morning 5)  Prilosec 20 Mg Cpdr (Omeprazole) .... Take 1 Capsule By Mouth Twice A Day 6)  Simvastatin 40 Mg Tabs (Simvastatin) .... Take 1 Tablet By Mouth At Bedtime 7)  Levothyroxine Sodium 50 Mcg  Tabs (Levothyroxine Sodium) .... Take 1 Tablet By Mouth Once A Day 8)  Lantus 100 Unit/ml  Soln (Insulin Glargine) .... 80 Subcutaneously Once Daily Hs 9)  Hydrocortisone 2.5 %  Lotn (Hydrocortisone) .... Once Daily As Needed 10)  Onetouch Ultra Test   Strp (Glucose Blood) .... Use Two Times A Day or As Directed 11)  Klor-Con M20 20 Meq Cr-Tabs (Potassium Chloride Crys Cr) .... Take 1 Tablet By Mouth Once A Day 12)  Trazodone Hcl 50 Mg Tabs (Trazodone Hcl) .Marland Kitchen.. 1-2 Q Day 13)  Citalopram Hydrobromide 40 Mg Tabs (Citalopram Hydrobromide) .... One By Mouth Daily  Allergies: 1)  Morphine Sulfate (Morphine Sulfate)  Past History:  Past Medical History: Last updated: 09/21/2009 Colonic polyps, hx of Diabetes mellitus, type II Diverticulosis, colon GERD Hyperlipidemia Hypertension OSA elevated PSA diabetic retinopathy Hypothyroidism Irritable Bowel Syndrome Obesity Depression  Past Surgical History: Last updated: 08/19/2008 R leg fx--ORIF Cholecystectomy Hemorrhoidectomy  Family History: Last updated: Aug 28, 2008 father deceased MVA mother stomach CA age 70 Diabetes- Patient  Social History: Last updated: 2008-08-28 Retired Married Regular exercise-no Caffeine- sodas  Risk Factors: Exercise: no (12/30/2007)  Risk Factors: Smoking Status: quit (05/23/2010)  Physical Exam  General:  Well-developed,well-nourished,in no acute distress; alert,appropriate and cooperative throughout  examination Head:  normocephalic and atraumatic.   Eyes:  pupils equal and pupils round.   Ears:  R ear normal and L ear normal.   Abdomen:  Bowel sounds positive,abdomen soft and non-tender without masses, organomegaly or hernias noted. obese Msk:  No deformity or scoliosis noted of thoracic or lumbar spine.   Neurologic:  cranial nerves II-XII intact and gait normal.     Impression & Recommendations:  Problem # 1:  OBESITY (ICD-278.00)  this is clearly his most significant problem desperately needs to lose weight as obesity is the cause of his DM (with retinopathy), htn. Also contributing to GERD , OSA and lipids reviewed information I'll see every month to supervise weight loss program labs drawn (request from Dr. Daphine Deutscher)  Orders: Venipuncture (44[address]) TLB-Lipid Panel (80061-LIPID) TLB-BMP (Basic Metabolic Panel-BMET) (80048-METABOL) TLB-Hepatic/Liver Function Pnl (80076-HEPATIC) TLB-H. Pylori Abs(Helicobacter Pylori) (86677-HELICO) TLB-T4 (Thyrox), Free [phone]) T-T3, Free [phone])  Problem # 2:  HYPERTENSION (ICD-401.9) reasonable control due to weight His updated medication list for this problem includes:    Amlodipine Besylate 10 Mg Tabs (Amlodipine besylate) .Marland Kitchen... 1 tablet by mouth once a day    Cozaar 100 Mg Tabs (Losartan potassium) .Marland Kitchen... 1 by mouth once daily    Hydrochlorothiazide 25 Mg Tabs (Hydrochlorothiazide) .Marland Kitchen... Take 1 tablet by mouth every morning  BP today: 140/78 Prior BP: 142/76 (03/19/2010)  Labs Reviewed: K+: 3.7 (01/09/2010) Creat: : 1.0 (01/09/2010)   Chol: 138 (01/09/2010)   HDL: 40.90 (01/09/2010)   LDL: 77 (09/14/2009)   TG: 209.0 (01/09/2010)  Problem # 3:  HYPERLIPIDEMIA (ICD-272.4)  check labs today His updated medication list for this problem includes:    Simvastatin 40 Mg Tabs (Simvastatin) .Marland Kitchen... Take 1 tablet by mouth at bedtime  Labs Reviewed: SGOT: 27 (01/09/2010)   SGPT: 37 (01/09/2010)   HDL:40.90 (01/09/2010), 38.40  (09/14/2009)  LDL:77 (09/14/2009), 90 (04/24/2009)  Chol:138 (01/09/2010), 151 (09/14/2009)  Trig:209.0 (01/09/2010), 180.0 (09/14/2009)  Orders: TLB-TSH (Thyroid Stimulating Hormone) (84443-TSH)  Problem # 4:  GERD (ICD-530.81) reviewed EGD His updated medication list for this problem includes:    Prilosec 20 Mg Cpdr (Omeprazole) .Marland Kitchen... Take 1 capsule by mouth twice a day  EGD: Location: [facility]   (09/28/2008)  Labs Reviewed: Hgb: 15.7 (12/08/2006)   Hct: 45.6 (12/08/2006)  Complete Medication List: 1)  Amlodipine Besylate 10 Mg Tabs (Amlodipine besylate) .Marland Kitchen.. 1 tablet by mouth once a day 2)  Cozaar 100 Mg Tabs (Losartan potassium) .Marland Kitchen.. 1 by mouth once daily 3)  Glucophage 1000 Mg Tabs (Metformin hcl) .... Take 1 tablet by mouth twice a day 4)  Hydrochlorothiazide 25 Mg Tabs (Hydrochlorothiazide) .... Take 1 tablet by mouth every morning 5)  Prilosec 20 Mg Cpdr (Omeprazole) .... Take 1 capsule by mouth twice a day 6)  Simvastatin 40 Mg Tabs (Simvastatin) .... Take 1 tablet by mouth at bedtime 7)  Levothyroxine Sodium 50 Mcg Tabs (Levothyroxine sodium) .... Take 1 tablet by mouth once a day 8)  Lantus 100 Unit/ml Soln (Insulin glargine) .... 80 subcutaneously once daily hs 9)  Hydrocortisone 2.5 % Lotn (Hydrocortisone) .... Once daily  as needed 10)  Onetouch Ultra Test Strp (Glucose blood) .... Use two times a day or as directed 11)  Klor-con M20 20 Meq Cr-tabs (Potassium chloride crys cr) .... Take 1 tablet by mouth once a day 12)  Trazodone Hcl 50 Mg Tabs (Trazodone hcl) .Marland Kitchen.. 1-2 q day 13)  Citalopram Hydrobromide 40 Mg Tabs (Citalopram hydrobromide) .... One by mouth daily  Other Orders: TLB-A1C / Hgb A1C (Glycohemoglobin) (83036-A1C)  Patient Instructions: 1)  Please schedule a follow-up appointment in 1 month.  Appended Document: ROA/FUP/QUESTIONS LAB BAND SURGERY/lipids/lfts/bmet/RCD wife present during evaluation

## 2011-01-01 NOTE — Progress Notes (Signed)
  Phone Note Call from Patient Call back at Home Phone [phone]   Caller: Spouse Call For: David Sons MD Summary of Call: wife is requesting dm     Appended Document: glucometer Given Freestyle Freedom Lite.

## 2011-01-01 NOTE — Medication Information (Signed)
Summary: Order for CPAP Supplies/[facility]  Order for CPAP Supplies/[facility]   Imported By: Maryln Gottron 08/30/2009 08:17:06  _____________________________________________________________________  External Attachment:    Type:   Image     Comment:   External Document

## 2011-01-01 NOTE — Letter (Signed)
Summary: Patient Notice- Polyp Results  [facility]  [address] [place], [place] [address]   Phone: [phone]  Fax: [phone]        September 30, 2008 MRN: [id number]    ROSEMARY PENTECOST [address] [place], [place]  [address]    Dear Mr. HLAVACEK,  I am pleased to inform you that the colon polyp removed during your recent colonoscopy was found to be benign (no cancer detected) upon pathologic examination.  I recommend you have a repeat colonoscopy examination in 5 years to look for recurrent polyps, as having colon polyps increases your risk for having recurrent polyps or even colon cancer in the future.  Should you develop new or worsening symptoms of abdominal pain, bowel habit changes or bleeding from the rectum or bowels, please schedule an evaluation with either your primary care physician or with me.   Please call us if you are having persistent problems or have questions about your condition that have not been fully answered at this time.    Sincerely,  Iva Boop MD  This letter has been electronically signed by your physician.

## 2011-01-01 NOTE — Assessment & Plan Note (Signed)
Summary: 4 month fup//ccm rsc bmp/njr/pt rscd//ccm rsc bmp/njr   Vital Signs:  Patient profile:   70 year old male Weight:      264 pounds BMI:     35.93 Temp:     98.7 degrees F oral Pulse rate:   72 / minute Pulse rhythm:   regular Resp:     12 per minute BP sitting:   138 / 66  (left arm) Cuff size:   regular  Vitals Entered By: Gladis Riffle, RN (June 22, 2010 12:03 PM)  Nutrition Counseling: Patient's BMI is greater than 25 and therefore counseled on weight management options. CC: 4 month rov--CBGs 82-130 in AM and >200 in PM at home Is Patient Diabetic? Yes Did you bring your meter with you today? No   Primary Care Provider:  Birdie Sons MD  CC:  4 month rov--CBGs 82-130 in AM and >200 in PM at home.  History of Present Illness: pt being evaluated for weight loss surgery he and his wife bring in several reports and forms that need review and completion  Follow-Up Visit      This is a 70 year old man who presents for Follow-up visit.  The patient denies chest pain and palpitations.  Since the last visit the patient notes no new problems or concerns and being seen by a specialist.  The patient reports taking meds as prescribed.  When questioned about possible medication side effects, the patient notes none.    Preventive Screening-Counseling & Management  Alcohol-Tobacco     Smoking Status: quit     Year Quit: 1990  Current Problems (verified): 1)  Knee Pain, Bilateral  (ICD-719.46) 2)  Depression  (ICD-311) 3)  Obesity  (ICD-278.00) 4)  Personal Hx Colonic Polyps  (ICD-V12.72) 5)  Erectile Dysfunction  (ICD-302.72) 6)  Obstructive Sleep Apnea  (ICD-327.23) 7)  Psa, Increased  (ICD-790.93) 8)  Diabetic Retinopathy  (ICD-250.50) 9)  Diverticulosis, Colon  (ICD-562.10) 10)  Hypertension  (ICD-401.9) 11)  Hyperlipidemia  (ICD-272.4) 12)  Gerd  (ICD-530.81) 13)  Diabetes Mellitus, Type II  (ICD-250.00)  Current Medications (verified): 1)  Amlodipine Besylate 10  Mg Tabs (Amlodipine Besylate) .Marland Kitchen.. 1 Tablet By Mouth Once A Day 2)  Cozaar 100 Mg Tabs (Losartan Potassium) .Marland Kitchen.. 1 By Mouth Once Daily 3)  Glucophage 1000 Mg Tabs (Metformin Hcl) .... Take 1 Tablet By Mouth Twice A Day 4)  Hydrochlorothiazide 25 Mg Tabs (Hydrochlorothiazide) .... Take 1 Tablet By Mouth Every Morning 5)  Prilosec 20 Mg Cpdr (Omeprazole) .... Take 1 Capsule By Mouth Twice A Day 6)  Simvastatin 40 Mg Tabs (Simvastatin) .... Take 1 Tablet By Mouth At Bedtime 7)  Levothyroxine Sodium 50 Mcg  Tabs (Levothyroxine Sodium) .... Take 1 Tablet By Mouth Once A Day 8)  Lantus 100 Unit/ml  Soln (Insulin Glargine) .... 80 Subcutaneously Once Daily Hs 9)  Hydrocortisone 2.5 %  Lotn (Hydrocortisone) .... Once Daily As Needed 10)  Onetouch Ultra Test   Strp (Glucose Blood) .... Use Two Times A Day or As Directed 11)  Klor-Con M20 20 Meq Cr-Tabs (Potassium Chloride Crys Cr) .... Take 1 Tablet By Mouth Once A Day 12)  Trazodone Hcl 50 Mg Tabs (Trazodone Hcl) .Marland Kitchen.. 1-2 Q Day 13)  Citalopram Hydrobromide 40 Mg Tabs (Citalopram Hydrobromide) .... One By Mouth Daily  Allergies: 1)  Morphine Sulfate (Morphine Sulfate)  Past History:  Past Medical History: Last updated: 09/21/2009 Colonic polyps, hx of Diabetes mellitus, type II Diverticulosis, colon GERD Hyperlipidemia  Hypertension OSA elevated PSA diabetic retinopathy Hypothyroidism Irritable Bowel Syndrome Obesity Depression  Past Surgical History: Last updated: 08/19/2008 R leg fx--ORIF Cholecystectomy Hemorrhoidectomy  Family History: Last updated: Sep 06, 2008 father deceased MVA mother stomach CA age 58 Diabetes- Patient  Social History: Last updated: 2008/09/06 Retired Married Regular exercise-no Caffeine- sodas  Risk Factors: Exercise: no (12/30/2007)  Risk Factors: Smoking Status: quit (06/22/2010)  Physical Exam  General:  Well-developed,well-nourished,in no acute distress; alert,appropriate and  cooperative throughout examination Head:  normocephalic and atraumatic.   Eyes:  pupils equal and pupils round.   Ears:  R ear normal and L ear normal.   Neck:  No deformities, masses, or tenderness noted. Chest Wall:  No deformities, masses, tenderness or gynecomastia noted. Lungs:  normal respiratory effort and no intercostal retractions.   Heart:  normal rate and regular rhythm.   Abdomen:  Bowel sounds positive,abdomen soft and non-tender without masses, organomegaly or hernias noted. obese Msk:  No deformity or scoliosis noted of thoracic or lumbar spine.   Pulses:  R radial normal and L radial normal.   Neurologic:  cranial nerves II-XII intact and gait normal.     Impression & Recommendations:  Problem # 1:  HYPERTENSION (ICD-401.9) adequate control His updated medication list for this problem includes:    Amlodipine Besylate 10 Mg Tabs (Amlodipine besylate) .Marland Kitchen... 1 tablet by mouth once a day    Cozaar 100 Mg Tabs (Losartan potassium) .Marland Kitchen... 1 by mouth once daily    Hydrochlorothiazide 25 Mg Tabs (Hydrochlorothiazide) .Marland Kitchen... Take 1 tablet by mouth every morning  BP today: 138/66 Prior BP: 140/78 (05/23/2010)  Labs Reviewed: K+: 4.2 (05/23/2010) Creat: : 1.0 (05/23/2010)   Chol: 159 (05/23/2010)   HDL: 39.90 (05/23/2010)   LDL: 77 (09/14/2009)   TG: 238.0 (05/23/2010)  Problem # 2:  HYPERLIPIDEMIA (ICD-272.4) controlled continue current medications  His updated medication list for this problem includes:    Simvastatin 40 Mg Tabs (Simvastatin) .Marland Kitchen... Take 1 tablet by mouth at bedtime  Labs Reviewed: SGOT: 38 (05/23/2010)   SGPT: 45 (05/23/2010)   HDL:39.90 (05/23/2010), 40.90 (01/09/2010)  LDL:77 (09/14/2009), 90 (04/24/2009)  Chol:159 (05/23/2010), 138 (01/09/2010)  Trig:238.0 (05/23/2010), 209.0 (01/09/2010)  Problem # 3:  DIABETES MELLITUS, TYPE II (ICD-250.00) not controlled all due to his weight--- advised weight loss  he is being evaluated for surgical options.    he has tried and failed multiple weight loss attempts over the past several years His updated medication list for this problem includes:    Cozaar 100 Mg Tabs (Losartan potassium) .Marland Kitchen... 1 by mouth once daily    Glucophage 1000 Mg Tabs (Metformin hcl) .Marland Kitchen... Take 1 tablet by mouth twice a day    Lantus 100 Unit/ml Soln (Insulin glargine) .Marland KitchenMarland KitchenMarland KitchenMarland Kitchen 80 subcutaneously once daily hs  Labs Reviewed: Creat: 1.0 (05/23/2010)     Last Eye Exam: normal-pt's report (09/01/2009) Reviewed HgBA1c results: 8.0 (05/23/2010)  7.5 (01/09/2010)  Problem # 4:  GERD (ICD-530.81) no sxs continue current medications  His updated medication list for this problem includes:    Prilosec 20 Mg Cpdr (Omeprazole) .Marland Kitchen... Take 1 capsule by mouth twice a day  Problem # 5:  OBESITY (ICD-278.00) almost certainly the cause of most of his medical problems including DM, htn, OA  Complete Medication List: 1)  Amlodipine Besylate 10 Mg Tabs (Amlodipine besylate) .Marland Kitchen.. 1 tablet by mouth once a day 2)  Cozaar 100 Mg Tabs (Losartan potassium) .Marland Kitchen.. 1 by mouth once daily 3)  Glucophage 1000 Mg Tabs (Metformin  hcl) .... Take 1 tablet by mouth twice a day 4)  Hydrochlorothiazide 25 Mg Tabs (Hydrochlorothiazide) .... Take 1 tablet by mouth every morning 5)  Prilosec 20 Mg Cpdr (Omeprazole) .... Take 1 capsule by mouth twice a day 6)  Simvastatin 40 Mg Tabs (Simvastatin) .... Take 1 tablet by mouth at bedtime 7)  Levothyroxine Sodium 50 Mcg Tabs (Levothyroxine sodium) .... Take 1 tablet by mouth once a day 8)  Lantus 100 Unit/ml Soln (Insulin glargine) .... 80 subcutaneously once daily hs 9)  Hydrocortisone 2.5 % Lotn (Hydrocortisone) .... Once daily as needed 10)  Onetouch Ultra Test Strp (Glucose blood) .... Use two times a day or as directed 11)  Klor-con M20 20 Meq Cr-tabs (Potassium chloride crys cr) .... Take 1 tablet by mouth once a day 12)  Trazodone Hcl 50 Mg Tabs (Trazodone hcl) .Marland Kitchen.. 1-2 q day 13)  Citalopram Hydrobromide 40  Mg Tabs (Citalopram hydrobromide) .... One by mouth daily  Patient Instructions: 1)  Please schedule a follow-up appointment in 1 month.

## 2011-01-01 NOTE — Assessment & Plan Note (Signed)
Summary: 4 month rov/njr   Vital Signs:  Patient profile:   70 year old male Weight:      257 pounds Temp:     98.2 degrees F Pulse rate:   88 / minute Resp:     14 per minute BP sitting:   120 / 64  (left arm)  Vitals Entered By: Gladis Riffle, RN (January 17, 2010 7:46 AM) CC: 4 month rov, labs done Is Patient Diabetic? Yes Did you bring your meter with you today? No Comments CBGs ok at home c/o sore throat   Primary Care Provider:  Birdie Sons MD  CC:  4 month rov and labs done.  History of Present Illness:  Follow-Up Visit      This is a 70 year old man who presents for Follow-up visit.  The patient denies chest pain and palpitations.  Since the last visit the patient notes no new problems or concerns.  The patient reports taking meds as prescribed.  When questioned about possible medication side effects, the patient notes none.    All other systems reviewed and were negativeexcept yesterday developed sore throat no fever  Preventive Screening-Counseling & Management  Alcohol-Tobacco     Smoking Status: quit     Year Quit: 1990  Medications Prior to Update: 1)  Amlodipine Besylate 10 Mg Tabs (Amlodipine Besylate) .Marland Kitchen.. 1 Tablet By Mouth Once A Day 2)  Cozaar 100 Mg Tabs (Losartan Potassium) .Marland Kitchen.. 1 By Mouth Once Daily 3)  Glipizide 5 Mg Tabs (Glipizide) .... Take 1 Tablet By Mouth Twice A Day 4)  Glucophage 1000 Mg Tabs (Metformin Hcl) .... Take 1 Tablet By Mouth Twice A Day 5)  Hydrochlorothiazide 25 Mg Tabs (Hydrochlorothiazide) .... Take 1 Tablet By Mouth Every Morning 6)  Prilosec 20 Mg Cpdr (Omeprazole) .... Take 1 Capsule By Mouth Twice A Day 7)  Simvastatin 40 Mg Tabs (Simvastatin) .... Take 1 Tablet By Mouth At Bedtime 8)  Levothyroxine Sodium 50 Mcg  Tabs (Levothyroxine Sodium) .... Take 1 Tablet By Mouth Once A Day 9)  Lantus 100 Unit/ml  Soln (Insulin Glargine) .... 80 Subcutaneously Once Daily Hs 10)  Hydrocortisone 2.5 %  Lotn (Hydrocortisone) .... Once  Daily As Needed 11)  Onetouch Ultra Test   Strp (Glucose Blood) .... Use Two Times A Day or As Directed 12)  Klor-Con M20 20 Meq Cr-Tabs (Potassium Chloride Crys Cr) .... Take 1 Tablet By Mouth Once A Day 13)  Trazodone Hcl 50 Mg Tabs (Trazodone Hcl) .Marland Kitchen.. 1-2 Q Day 14)  Citalopram Hydrobromide 40 Mg Tabs (Citalopram Hydrobromide) .... One By Mouth Daily  Allergies: 1)  Morphine Sulfate (Morphine Sulfate)  Past History:  Past Medical History: Last updated: 09/21/2009 Colonic polyps, hx of Diabetes mellitus, type II Diverticulosis, colon GERD Hyperlipidemia Hypertension OSA elevated PSA diabetic retinopathy Hypothyroidism Irritable Bowel Syndrome Obesity Depression  Past Surgical History: Last updated: 08/19/2008 R leg fx--ORIF Cholecystectomy Hemorrhoidectomy  Family History: Last updated: 08/26/08 father deceased MVA mother stomach CA age 75 Diabetes- Patient  Social History: Last updated: 08-26-2008 Retired Married Regular exercise-no Caffeine- sodas  Risk Factors: Exercise: no (12/30/2007)  Risk Factors: Smoking Status: quit (01/17/2010)  Review of Systems       All other systems reviewed and were negative   Physical Exam  General:  Well-developed,well-nourished,in no acute distress; alert,appropriate and cooperative throughout examination Head:  normocephalic and atraumatic.   Eyes:  pupils equal and pupils round.   Ears:  R ear normal and L ear normal.  Neck:  No deformities, masses, or tenderness noted. Chest Wall:  No deformities, masses, tenderness or gynecomastia noted. Lungs:  Normal respiratory effort, chest expands symmetrically. Lungs are clear to auscultation, no crackles or wheezes. Heart:  Normal rate and regular rhythm. S1 and S2 normal without gallop, murmur, click, rub or other extra sounds. Abdomen:  Bowel sounds positive,abdomen soft and non-tender without masses, organomegaly or hernias noted. obese Msk:  No deformity or  scoliosis noted of thoracic or lumbar spine.   Pulses:  R radial normal and L radial normal.   Neurologic:  cranial nerves II-XII intact and gait normal.   Skin:  turgor normal.  turgor normal and color normal.   Psych:  good eye contact and not anxious appearing.  good eye contact and not anxious appearing.     Impression & Recommendations:  Problem # 1:  OBESITY (ICD-278.00) pt is not interested in weight loss / lifestyle changes  Problem # 2:  HYPERTENSION (ICD-401.9) controlled continue current medications  His updated medication list for this problem includes:    Amlodipine Besylate 10 Mg Tabs (Amlodipine besylate) .Marland Kitchen... 1 tablet by mouth once a day    Cozaar 100 Mg Tabs (Losartan potassium) .Marland Kitchen... 1 by mouth once daily    Hydrochlorothiazide 25 Mg Tabs (Hydrochlorothiazide) .Marland Kitchen... Take 1 tablet by mouth every morning  BP today: 120/64 Prior BP: 152/60 (09/21/2009)  Labs Reviewed: K+: 3.7 (01/09/2010) Creat: : 1.0 (01/09/2010)   Chol: 138 (01/09/2010)   HDL: 40.90 (01/09/2010)   LDL: 77 (09/14/2009)   TG: 209.0 (01/09/2010)  Problem # 3:  HYPERLIPIDEMIA (ICD-272.4)  His updated medication list for this problem includes:    Simvastatin 40 Mg Tabs (Simvastatin) .Marland Kitchen... Take 1 tablet by mouth at bedtime  Labs Reviewed: SGOT: 27 (01/09/2010)   SGPT: 37 (01/09/2010)   HDL:40.90 (01/09/2010), 38.40 (09/14/2009)  LDL:77 (09/14/2009), 90 (04/24/2009)  Chol:138 (01/09/2010), 151 (09/14/2009)  Trig:209.0 (01/09/2010), 180.0 (09/14/2009)  His updated medication list for this problem includes:    Simvastatin 40 Mg Tabs (Simvastatin) .Marland Kitchen... Take 1 tablet by mouth at bedtime  Problem # 4:  DIABETES MELLITUS, TYPE II (ICD-250.00) reasonable control but not as good as it was---discussed weight loss as the "cure" he refuses continue current medications  could stop glipizide The following medications were removed from the medication list:    Glipizide 5 Mg Tabs (Glipizide) .Marland Kitchen... Take 1  tablet by mouth twice a day His updated medication list for this problem includes:    Cozaar 100 Mg Tabs (Losartan potassium) .Marland Kitchen... 1 by mouth once daily    Glucophage 1000 Mg Tabs (Metformin hcl) .Marland Kitchen... Take 1 tablet by mouth twice a day    Lantus 100 Unit/ml Soln (Insulin glargine) .Marland KitchenMarland KitchenMarland KitchenMarland Kitchen 80 subcutaneously once daily hs  Labs Reviewed: Creat: 1.0 (01/09/2010)     Last Eye Exam: normal-pt's report (09/01/2009) Reviewed HgBA1c results: 7.5 (01/09/2010)  6.8 (09/14/2009)  Complete Medication List: 1)  Amlodipine Besylate 10 Mg Tabs (Amlodipine besylate) .Marland Kitchen.. 1 tablet by mouth once a day 2)  Cozaar 100 Mg Tabs (Losartan potassium) .Marland Kitchen.. 1 by mouth once daily 3)  Glucophage 1000 Mg Tabs (Metformin hcl) .... Take 1 tablet by mouth twice a day 4)  Hydrochlorothiazide 25 Mg Tabs (Hydrochlorothiazide) .... Take 1 tablet by mouth every morning 5)  Prilosec 20 Mg Cpdr (Omeprazole) .... Take 1 capsule by mouth twice a day 6)  Simvastatin 40 Mg Tabs (Simvastatin) .... Take 1 tablet by mouth at bedtime 7)  Levothyroxine Sodium 50 Mcg  Tabs (Levothyroxine sodium) .... Take 1 tablet by mouth once a day 8)  Lantus 100 Unit/ml Soln (Insulin glargine) .... 80 subcutaneously once daily hs 9)  Hydrocortisone 2.5 % Lotn (Hydrocortisone) .... Once daily as needed 10)  Onetouch Ultra Test Strp (Glucose blood) .... Use two times a day or as directed 11)  Klor-con M20 20 Meq Cr-tabs (Potassium chloride crys cr) .... Take 1 tablet by mouth once a day 12)  Trazodone Hcl 50 Mg Tabs (Trazodone hcl) .Marland Kitchen.. 1-2 q day 13)  Citalopram Hydrobromide 40 Mg Tabs (Citalopram hydrobromide) .... One by mouth daily  Other Orders: T-Knee Comp Right 4 Views [phone]) T-Knee Comp Left 4 Views [phone])   Patient Instructions: 1)  Please schedule a follow-up appointment in 4 months. 2)  labs one week prior to visit 3)  lipids---272.4 4)  lfts-995.2 5)  bmet-995.2 6)  A1C-250.02 7)

## 2011-01-01 NOTE — Assessment & Plan Note (Signed)
Summary: 4 month f/up//db   Vital Signs:  Patient Profile:   70 Years Old Male Height:     73 inches Weight:      258 pounds Temp:     98.1 degrees F Pulse rate:   68 / minute BP sitting:   138 / 70  (left arm)  Vitals Entered By: Gladis Riffle, RN (August 30, 2008 8:06 AM)                 PCP:  Birdie Sons MD  Chief Complaint:  f/u.  History of Present Illness:  Follow-Up Visit      This is a 70 year old man who presents for Follow-up visit.  The patient denies chest pain, palpitations, dizziness, syncope, low blood sugar symptoms, high blood sugar symptoms, edema, SOB, DOE, PND, and orthopnea.  Since the last visit the patient notes no new problems or concerns.  The patient reports taking meds as prescribed, not monitoring BP, not monitoring blood sugars, and dietary noncompliance.  When questioned about possible medication side effects, the patient notes none.    Past Medical History: Colonic polyps, hx of Diabetes mellitus, type II Diverticulosis, colon GERD Hyperlipidemia Hypertension OSA elevated PSA diabetic retinopathy Hypothyroidism Irritable Bowel Syndrome Obesity  Past Surgical History: R leg fx--ORIF Cholecystectomy Hemorrhoidectomy  Social History: Retired Married Regular exercise-no Caffeine- sodas  Family History: father deceased MVA mother stomach CA age 55 Diabetes- Patient no other complaints in a complete ROS     Current Allergies: MORPHINE SULFATE (MORPHINE SULFATE)      Physical Exam  General:     Well-developed,well-nourished,in no acute distress; alert,appropriate and cooperative throughout examination Head:     normocephalic and no abnormalities observed.   Eyes:     pupils equal and pupils round.   Ears:     R ear normal and L ear normal.   Neck:     No deformities, masses, or tenderness noted. Chest Wall:     No deformities, masses, tenderness or gynecomastia noted. Lungs:     Normal respiratory effort, chest  expands symmetrically. Lungs are clear to auscultation, no crackles or wheezes. Heart:     Normal rate and regular rhythm. S1 and S2 normal without gallop, murmur, click, rub or other extra sounds. Abdomen:     Bowel sounds positive,abdomen soft and non-tender without masses, organomegaly or hernias noted. Msk:     No deformity or scoliosis noted of thoracic or lumbar spine.   Pulses:     R and L carotid,radial,femoral,dorsalis pedis and posterior tibial pulses are full and equal bilaterally Extremities:     No clubbing, cyanosis, edema, or deformity noted  Neurologic:     cranial nerves II-XII intact and gait normal.   Skin:     Intact without suspicious lesions or rashes Cervical Nodes:     no anterior cervical adenopathy and no posterior cervical adenopathy.   Psych:     good eye contact and not anxious appearing.      Impression & Recommendations:  Problem # 1:  DIABETES MELLITUS, TYPE II (ICD-250.00) yearly eye exam daily foot exam increase to 60 units (currently on 55 units) His updated medication list for this problem includes:    Cozaar 100 Mg Tabs (Losartan potassium) .Marland Kitchen... 1 by mouth once daily    Glipizide 5 Mg Tabs (Glipizide) .Marland Kitchen... Take 1 tablet by mouth twice a day    Glucophage 1000 Mg Tabs (Metformin hcl) .Marland Kitchen... Take 1 tablet by mouth twice a  day    Lantus 100 Unit/ml Soln (Insulin glargine) .Marland KitchenMarland KitchenMarland KitchenMarland Kitchen 60 subcutaneously once daily hs  Labs Reviewed: HgBA1c: 7.8 (08/23/2008)   Creat: 0.9 (08/23/2008)   Microalbumin: 0.4 (12/24/2007)   Problem # 2:  HYPERLIPIDEMIA (ICD-272.4) continue current meds His updated medication list for this problem includes:    Simvastatin 40 Mg Tabs (Simvastatin) .Marland Kitchen... Take 1 tablet by mouth at bedtime  Labs Reviewed: Chol: 148 (08/23/2008)   HDL: 34.9 (08/23/2008)   LDL: 78 (08/23/2008)   TG: 176 (08/23/2008) SGOT: 36 (08/23/2008)   SGPT: 47 (08/23/2008)   Problem # 3:  HYPERTENSION (ICD-401.9) continue meds well controlled His  updated medication list for this problem includes:    Amlodipine Besylate 10 Mg Tabs (Amlodipine besylate) .Marland Kitchen... 1 tablet by mouth once a day    Cozaar 100 Mg Tabs (Losartan potassium) .Marland Kitchen... 1 by mouth once daily    Hydrochlorothiazide 25 Mg Tabs (Hydrochlorothiazide) .Marland Kitchen... Take 1 tablet by mouth every morning  BP today: 138/70 Prior BP: 136/80 (08/22/2008)  Labs Reviewed: Creat: 0.9 (08/23/2008) Chol: 148 (08/23/2008)   HDL: 34.9 (08/23/2008)   LDL: 78 (08/23/2008)   TG: 176 (08/23/2008)   Problem # 4:  GERD (ICD-530.81) scheduled for endoscopy His updated medication list for this problem includes:    Prilosec 20 Mg Cpdr (Omeprazole) .Marland Kitchen... Take 1 capsule by mouth twice a day   Complete Medication List: 1)  Amlodipine Besylate 10 Mg Tabs (Amlodipine besylate) .Marland Kitchen.. 1 tablet by mouth once a day 2)  Cozaar 100 Mg Tabs (Losartan potassium) .Marland Kitchen.. 1 by mouth once daily 3)  Glipizide 5 Mg Tabs (Glipizide) .... Take 1 tablet by mouth twice a day 4)  Glucophage 1000 Mg Tabs (Metformin hcl) .... Take 1 tablet by mouth twice a day 5)  Hydrochlorothiazide 25 Mg Tabs (Hydrochlorothiazide) .... Take 1 tablet by mouth every morning 6)  Prilosec 20 Mg Cpdr (Omeprazole) .... Take 1 capsule by mouth twice a day 7)  Simvastatin 40 Mg Tabs (Simvastatin) .... Take 1 tablet by mouth at bedtime 8)  Levothyroxine Sodium 50 Mcg Tabs (Levothyroxine sodium) .... Take 1 tablet by mouth once a day 9)  Lantus 100 Unit/ml Soln (Insulin glargine) .... 60 subcutaneously once daily hs 10)  Hydrocortisone 2.5 % Lotn (Hydrocortisone) .... Once daily as needed 11)  Onetouch Ultra Test Strp (Glucose blood) .... Use two times a day or as directed 12)  Transderm-scop 1.5 Mg Pt72 (Scopolamine base) .... Change patch every 3 days 13)  Miralax Powd (Polyethylene glycol 3350) .... As per prep  instructions. 14)  Metoclopramide Hcl 10 Mg Tabs (Metoclopramide hcl) .... As per prep instructions. 15)  Dulcolax 5 Mg Tbec  (Bisacodyl) .... Day before procedure take 2 at 3pm and 2 at 8pm.   Patient Instructions: 1)  Please schedule a follow-up appointment in 4 months.   ]

## 2011-01-01 NOTE — Progress Notes (Signed)
Summary: seasick patches  Phone Note Call from Patient   Summary of Call: Pt needs seasickness patches for both he and his wife for 7 day cruise.  Needs asap. [employer]. Initial call taken by: Lynann Beaver CMA,  Apr 24, 2009 8:27 AM  Follow-up for Phone Call        scopolamine patch apply  every 3 days #3 for both of them Follow-up by: Birdie Sons MD,  Apr 24, 2009 9:15 AM    New/Updated Medications: TRANSDERM-SCOP 1.5 MG PT72 (SCOPOLAMINE BASE) apply q 3 days   Prescriptions: TRANSDERM-SCOP 1.5 MG PT72 (SCOPOLAMINE BASE) apply q 3 days  #6 x 0   Entered by:   Lynann Beaver CMA   Authorized by:   Birdie Sons MD   Signed by:   Lynann Beaver CMA on 04/24/2009   Method used:   F[facility]ed to ...       [facility]       [address]       [place], [place]  [address]       Ph: [phone] or [phone]       Fax: [phone]   RxID:   [id number]

## 2011-01-01 NOTE — Medication Information (Signed)
Summary: Order for CPAP Supplies/[facility]  Order for CPAP Supplies/[facility]   Imported By: Maryln Gottron 02/27/2010 14:23:26  _____________________________________________________________________  External Attachment:    Type:   Image     Comment:   External Document

## 2011-01-01 NOTE — Assessment & Plan Note (Signed)
Summary: 4 month roa/jls   Vital Signs:  Patient Profile:   70 Years Old Male Weight:      254 pounds Temp:     98.6 degrees F oral Pulse rate:   88 / minute Pulse rhythm:   regular BP sitting:   150 / 82  (left arm) Cuff size:   regular  Vitals Entered By: Sid Falcon LPN (Apr 28, 2008 8:38 AM)                 Chief Complaint:  FOLLOW-UP VISIT, BS 109 this AM, pt taking Simvastatin X 2 weeks, and then stops due to stomach upset.  History of Present Illness:  Follow-Up Visit      This is a 70 year old man who presents for Follow-up visit.  The patient denies chest pain, palpitations, dizziness, syncope, low blood sugar symptoms, high blood sugar symptoms, edema, SOB, DOE, PND, and orthopnea.  Since the last visit the patient notes no new problems or concerns.  The patient reports taking meds as prescribed, not monitoring BP, and not monitoring blood sugars.  When questioned about possible medication side effects, the patient notes none.   interested in penile prosthesis    Current Allergies: MORPHINE SULFATE (MORPHINE SULFATE)  Past Medical History:    Reviewed history from 09/24/2007 and no changes required:       Colonic polyps, hx of       Diabetes mellitus, type II       Diverticulosis, colon       GERD       Hyperlipidemia       Hypertension       OSA       elevated PSA       diabetic retinopathy  Past Surgical History:    Reviewed history from 09/24/2007 and no changes required:       R leg fx--ORIF       Cholecystectomy   Family History:    Reviewed history from 12/30/2007 and no changes required:       father deceased MVA       mother stomach CA age 95  Social History:    Reviewed history from 12/30/2007 and no changes required:       Retired       Married       Regular exercise-no    Review of Systems       no other complaints in a complete ROS    Physical Exam  General:     Well-developed,well-nourished,in no acute distress;  alert,appropriate and cooperative throughout examination Head:     normocephalic and atraumatic.   Eyes:     pupils equal and pupils round.   Ears:     R ear normal and L ear normal.   Nose:     no external deformity and no external erythema.   Neck:     No deformities, masses, or tenderness noted. Chest Wall:     No deformities, masses, tenderness or gynecomastia noted. Lungs:     Normal respiratory effort, chest expands symmetrically. Lungs are clear to auscultation, no crackles or wheezes. Heart:     Normal rate and regular rhythm. S1 and S2 normal without gallop, murmur, click, rub or other extra sounds. Abdomen:     Bowel sounds positive,abdomen soft and non-tender without masses, organomegaly or hernias noted. Msk:     No deformity or scoliosis noted of thoracic or lumbar spine.   Pulses:  R radial normal and L radial normal.   Neurologic:     cranial nerves II-XII intact.  uses cane for ambulation    Impression & Recommendations:  Problem # 1:  DIABETES MELLITUS, TYPE II (ICD-250.00)  His updated medication list for this problem includes:    Cozaar 100 Mg Tabs (Losartan potassium) .Marland Kitchen... 1 by mouth once daily    Glipizide 5 Mg Tabs (Glipizide) .Marland Kitchen... Take 1 tablet by mouth twice a day    Glucophage 1000 Mg Tabs (Metformin hcl) .Marland Kitchen... Take 1 tablet by mouth twice a day    Lantus 100 Unit/ml Soln (Insulin glargine) .Marland KitchenMarland KitchenMarland KitchenMarland Kitchen 50 subcutaneously once daily hs  Labs Reviewed: HgBA1c: 7.4 (04/21/2008)   Creat: 0.9 (04/21/2008)      Problem # 2:  HYPERTENSION (ICD-401.9) repeat BP 138/88-best treatment at this time would be aggressive weight loss, daily vigorous exercise His updated medication list for this problem includes:    Amlodipine Besylate 10 Mg Tabs (Amlodipine besylate) .Marland Kitchen... 1 tablet by mouth once a day    Cozaar 100 Mg Tabs (Losartan potassium) .Marland Kitchen... 1 by mouth once daily    Hydrochlorothiazide 25 Mg Tabs (Hydrochlorothiazide) .Marland Kitchen... Take 1 tablet by mouth every  morning  BP today: 150/82 Prior BP: 142/98 (01/26/2008)  Labs Reviewed: Creat: 0.9 (04/21/2008) Chol: 153 (04/21/2008)   HDL: 32.6 (04/21/2008)   LDL: 96 (04/21/2008)   TG: 120 (04/21/2008)   Problem # 3:  HYPERLIPIDEMIA (ICD-272.4) tolerating meds---adequate control His updated medication list for this problem includes:    Simvastatin 40 Mg Tabs (Simvastatin) .Marland Kitchen... Take 1 tablet by mouth at bedtime  Labs Reviewed: Chol: 153 (04/21/2008)   HDL: 32.6 (04/21/2008)   LDL: 96 (04/21/2008)   TG: 120 (04/21/2008) SGOT: 33 (04/21/2008)   SGPT: 42 (04/21/2008)   Problem # 4:  PREOPERATIVE EXAMINATION (ICD-V72.84) plans for prosthesis noted---should be ok for surgery although at increased risk given his multiple medical problems  Problem # 5:  GERD (ICD-530.81)  His updated medication list for this problem includes:    Prilosec 20 Mg Cpdr (Omeprazole) .Marland Kitchen... Take 1 capsule by mouth twice a day   Problem # 6:  DIVERTICULOSIS, COLON (ICD-562.10) no sxs  Complete Medication List: 1)  Amlodipine Besylate 10 Mg Tabs (Amlodipine besylate) .Marland Kitchen.. 1 tablet by mouth once a day 2)  Cozaar 100 Mg Tabs (Losartan potassium) .Marland Kitchen.. 1 by mouth once daily 3)  Glipizide 5 Mg Tabs (Glipizide) .... Take 1 tablet by mouth twice a day 4)  Glucophage 1000 Mg Tabs (Metformin hcl) .... Take 1 tablet by mouth twice a day 5)  Hydrochlorothiazide 25 Mg Tabs (Hydrochlorothiazide) .... Take 1 tablet by mouth every morning 6)  Prilosec 20 Mg Cpdr (Omeprazole) .... Take 1 capsule by mouth twice a day 7)  Simvastatin 40 Mg Tabs (Simvastatin) .... Take 1 tablet by mouth at bedtime 8)  Levothyroxine Sodium 50 Mcg Tabs (Levothyroxine sodium) .... Take 1 tablet by mouth once a day 9)  Lantus 100 Unit/ml Soln (Insulin glargine) .... 50 subcutaneously once daily hs 10)  Hydrocortisone 2.5 % Lotn (Hydrocortisone) .... Once daily as needed   Patient Instructions: 1)  Please schedule a follow-up appointment in 4 months. 2)   labs one week prior to visit 3)  lipids---272.4 4)  lfts-995.2 5)  bmet-995.2 6)  A1C-250.02 7)      ]

## 2011-01-01 NOTE — Letter (Signed)
Summary: Generic Letter  [facility]  [address] [place], [place] [address]   Phone: [phone]  Fax: [phone]    03/19/2010  [name] [address] [place], [place]  [address]  To Whom It May Concern,  the above-named patient has been seen by our office for many years.  The patient has the following problems: Current Problems:  KNEE PAIN, BILATERAL (ICD-719.46) DEPRESSION (ICD-311) OBESITY (ICD-278.00) PERSONAL HX COLONIC POLYPS (ICD-V12.72) DYSPHAGIA (YSA-630.16) ERECTILE DYSFUNCTION (ICD-302.72) OBSTRUCTIVE SLEEP APNEA (ICD-327.23) PSA, INCREASED (ICD-790.93) DIABETIC  RETINOPATHY (ICD-250.50) DIVERTICULOSIS, COLON (ICD-562.10) HYPERTENSION (ICD-401.9) HYPERLIPIDEMIA (ICD-272.4) GERD (ICD-530.81) DIABETES MELLITUS, TYPE II (ICD-250.00)   Many of these are directly related to his morbid obesity. his current weight is 259 pounds.  His height is 72.5 inches.  His BMI is 34.77.  The patient has tried multiple diet programs including Weight Watchers, Slim fast and lifestyle modification.  These have all been unsuccessful.  Given the patient's multiple medical problems that are directly related to his obesity I think he would benefit greatly from surgical consideration of weight loss options.  The patient understands the risks of surgery.  But I feel his risk of continued obesity outweighs his risk of surgery.   Please finding enclosed a flow sheet with the patient's weight and BMI for the past several years.    Sincerely,  Birdie Sons MD  March 19, 2010 8:20 AM  Birdie Sons MD

## 2011-01-01 NOTE — Progress Notes (Signed)
Summary: change office note  Phone Note Call from Patient Call back at Home Phone [phone]   Caller: Patient Call For: Birdie Sons MD Summary of Call: pt having the lapband surgery and he said that the 10/31/10 Office note needs to say that you discussed weight loss and exercise with him.  Can you append the office note and put that in there? Initial call taken by: Alfred Levins, CMA,  November 06, 2010 3:01 PM

## 2011-01-03 NOTE — Assessment & Plan Note (Signed)
Summary: 1 month fup//ccm---PTS WIFE [facility] // RS   Vital Signs:  Patient profile:   70 year old male Weight:      272 pounds Temp:     98.6 degrees F oral Pulse rate:   80 / minute Pulse rhythm:   regular BP sitting:   134 / 76  (left arm) Cuff size:   large  Vitals Entered By: Alfred Levins, CMA (November 20, 2010 7:55 AM) CC: f/u   Primary Care Provider:  Birdie Sons MD  CC:  f/u.  History of Present Illness:  Follow-Up Visit      This is a 70 year old man who presents for Follow-up visit.  The patient denies chest pain and palpitations.  Since the last visit the patient notes no new problems or concerns and being seen by a specialist.  The patient reports taking meds as prescribed and dietary noncompliance.  When questioned about possible medication side effects, the patient notes none.   pt here for evlautation related to weight loss  All other systems reviewed and were negative   Current Problems (verified): 1)  Depression  (ICD-311) 2)  Obesity  (ICD-278.00) 3)  Personal Hx Colonic Polyps  (ICD-V12.72) 4)  Erectile Dysfunction  (ICD-302.72) 5)  Obstructive Sleep Apnea  (ICD-327.23) 6)  Psa, Increased  (ICD-790.93) 7)  Diabetic Retinopathy  (ICD-250.50) 8)  Diverticulosis, Colon  (ICD-562.10) 9)  Hypertension  (ICD-401.9) 10)  Hyperlipidemia  (ICD-272.4) 11)  Gerd  (ICD-530.81) 12)  Diabetes Mellitus, Type II  (ICD-250.00)  Current Medications (verified): 1)  Amlodipine Besylate 10 Mg Tabs (Amlodipine Besylate) .Marland Kitchen.. 1 Tablet By Mouth Once A Day 2)  Cozaar 100 Mg Tabs (Losartan Potassium) .Marland Kitchen.. 1 By Mouth Once Daily 3)  Glucophage 1000 Mg Tabs (Metformin Hcl) .... Take 1 Tablet By Mouth Twice A Day 4)  Hydrochlorothiazide 25 Mg Tabs (Hydrochlorothiazide) .... Take 1 Tablet By Mouth Every Morning 5)  Prilosec 20 Mg Cpdr (Omeprazole) .... Take 1 Capsule By Mouth Twice A Day 6)  Simvastatin 40 Mg Tabs (Simvastatin) .... Take 1 Tablet By Mouth At Bedtime 7)   Levothyroxine Sodium 50 Mcg  Tabs (Levothyroxine Sodium) .... Take 1 Tablet By Mouth Once A Day 8)  Lantus 100 Unit/ml  Soln (Insulin Glargine) .... 80 Subcutaneously Once Daily Hs 9)  Hydrocortisone 2.5 %  Lotn (Hydrocortisone) .... Once Daily As Needed 10)  Freestyle Lite Test  Strp (Glucose Blood) .... Use Two Times Daily As Directed 11)  Klor-Con M20 20 Meq Cr-Tabs (Potassium Chloride Crys Cr) .... Take 1 Tablet By Mouth Once A Day 12)  Trazodone Hcl 50 Mg Tabs (Trazodone Hcl) .Marland Kitchen.. 1-2 Q Day 13)  Citalopram Hydrobromide 20 Mg Tabs (Citalopram Hydrobromide) .... Take 1 Tablet By Mouth Once A Day  Allergies (verified): 1)  Morphine Sulfate (Morphine Sulfate)  Physical Exam  General:  well-developed, overweight male in no acute distress. HEENT exam atraumatic, normocephalic symmetric her muscles are intact. Neck is supple. Chest her auscultation cardiac exam S1-S2 are regular. Abdominal exam overweight, to bowel sounds, soft and nontender. Extremities no clubbing cyanosis or edema.   Impression & Recommendations:  Problem # 1:  OBESITY (ICD-278.00) this is his most significant problem counselled related to diet, exercise  Problem # 2:  HYPERTENSION (ICD-401.9) controlled His updated medication list for this problem includes:    Amlodipine Besylate 10 Mg Tabs (Amlodipine besylate) .Marland Kitchen... 1 tablet by mouth once a day    Cozaar 100 Mg Tabs (Losartan potassium) .Marland KitchenMarland KitchenMarland KitchenMarland Kitchen 1  by mouth once daily    Hydrochlorothiazide 25 Mg Tabs (Hydrochlorothiazide) .Marland Kitchen... Take 1 tablet by mouth every morning  BP today: 134/76 Prior BP: 136/76 (10/31/2010)  Labs Reviewed: K+: 3.8 (10/31/2010) Creat: : 1.0 (10/31/2010)   Chol: 146 (10/31/2010)   HDL: 38.80 (10/31/2010)   LDL: 77 (09/14/2009)   TG: 252.0 (10/31/2010)  Problem # 3:  HYPERLIPIDEMIA (ICD-272.4)  His updated medication list for this problem includes:    Simvastatin 40 Mg Tabs (Simvastatin) .Marland Kitchen... Take 1 tablet by mouth at bedtime  Labs  Reviewed: SGOT: 30 (10/31/2010)   SGPT: 41 (10/31/2010)   HDL:38.80 (10/31/2010), 39.90 (05/23/2010)  LDL:77 (09/14/2009), 90 (04/24/2009)  Chol:146 (10/31/2010), 159 (05/23/2010)  Trig:252.0 (10/31/2010), 238.0 (05/23/2010)  Problem # 4:  DIABETIC  RETINOPATHY (ICD-250.50) not controlled discussed weight loss goals.  His updated medication list for this problem includes:    Cozaar 100 Mg Tabs (Losartan potassium) .Marland Kitchen... 1 by mouth once daily    Glucophage 1000 Mg Tabs (Metformin hcl) .Marland Kitchen... Take 1 tablet by mouth twice a day    Lantus 100 Unit/ml Soln (Insulin glargine) .Marland KitchenMarland KitchenMarland KitchenMarland Kitchen 80 subcutaneously once daily hs  Labs Reviewed: Creat: 1.0 (10/31/2010)     Last Eye Exam: diabetic retinopathy (09/01/2010) Reviewed HgBA1c results: 8.3 (10/31/2010)  8.0 (05/23/2010)  Complete Medication List: 1)  Amlodipine Besylate 10 Mg Tabs (Amlodipine besylate) .Marland Kitchen.. 1 tablet by mouth once a day 2)  Cozaar 100 Mg Tabs (Losartan potassium) .Marland Kitchen.. 1 by mouth once daily 3)  Glucophage 1000 Mg Tabs (Metformin hcl) .... Take 1 tablet by mouth twice a day 4)  Hydrochlorothiazide 25 Mg Tabs (Hydrochlorothiazide) .... Take 1 tablet by mouth every morning 5)  Prilosec 20 Mg Cpdr (Omeprazole) .... Take 1 capsule by mouth twice a day 6)  Simvastatin 40 Mg Tabs (Simvastatin) .... Take 1 tablet by mouth at bedtime 7)  Levothyroxine Sodium 50 Mcg Tabs (Levothyroxine sodium) .... Take 1 tablet by mouth once a day 8)  Lantus 100 Unit/ml Soln (Insulin glargine) .... 80 subcutaneously once daily hs 9)  Hydrocortisone 2.5 % Lotn (Hydrocortisone) .... Once daily as needed 10)  Freestyle Lite Test Strp (Glucose blood) .... Use two times daily as directed 11)  Klor-con M20 20 Meq Cr-tabs (Potassium chloride crys cr) .... Take 1 tablet by mouth once a day 12)  Trazodone Hcl 50 Mg Tabs (Trazodone hcl) .Marland Kitchen.. 1-2 q day 13)  Citalopram Hydrobromide 20 Mg Tabs (Citalopram hydrobromide) .... Take 1 tablet by mouth once a day  Patient  Instructions: 1)  Please schedule a follow-up appointment in 1 month.   Orders Added: 1)  Est. Patient Level IV [56387]

## 2011-01-03 NOTE — Progress Notes (Signed)
  Phone Note Call from Patient Call back at Home Phone [phone]   Caller: Patient Call For: swords Summary of Call: pt has completed evaluation for the Lap-Band surgery.  Okey Regal at [facility] surgery needs notes.  Soyla Murphy and she informed that she had everything she needed.  pt aware Initial call taken by: Alfred Levins, CMA,  December 28, 2010 2:50 PM

## 2011-01-03 NOTE — Assessment & Plan Note (Signed)
Summary: ear issues//ccm   Vital Signs:  Patient profile:   70 year old male Temp:     98.9 degrees F oral BP sitting:   140 / 78  (left arm) Cuff size:   large  Vitals Entered By: Sid Falcon LPN (November 28, 2010 2:20 PM)  History of Present Illness: R ear pain and facial pain for 2 months.  Facial pain R maxillary. R ear feels stopped up.  Some nasal congestion. Clears throat frequently .  On Prilosec.  Some chills off and on. Mucinex without relief.  Occ yellowish mucus nasally.  Allergies: 1)  Morphine Sulfate (Morphine Sulfate)  Past History:  Past Medical History: Last updated: 09/21/2009 Colonic polyps, hx of Diabetes mellitus, type II Diverticulosis, colon GERD Hyperlipidemia Hypertension OSA elevated PSA diabetic retinopathy Hypothyroidism Irritable Bowel Syndrome Obesity Depression  Past Surgical History: Last updated: 08/19/2008 R leg fx--ORIF Cholecystectomy Hemorrhoidectomy  Family History: Last updated: 2008/09/20 father deceased MVA mother stomach CA age 68 Diabetes- Patient  Social History: Last updated: 2008-09-20 Retired Married Regular exercise-no Caffeine- sodas  Risk Factors: Exercise: no (12/30/2007)  Risk Factors: Smoking Status: quit (07/23/2010) PMH-FH-SH reviewed for relevance  Review of Systems      See HPI  Physical Exam  General:  Well-developed,well-nourished,in no acute distress; alert,appropriate and cooperative throughout examination Head:  Normocephalic and atraumatic without obvious abnormalities. No apparent alopecia or balding. tender over R maxillary sinus Ears:  External ear exam shows no significant lesions or deformities.  Otoscopic examination reveals clear canals, tympanic membranes are intact bilaterally without bulging, retraction, inflammation or discharge. Hearing is grossly normal bilaterally. Nose:  edema and mild erythema of mucosa. Mouth:  Oral mucosa and oropharynx without lesions or  exudates.  Teeth in good repair. Neck:  No deformities, masses, or tenderness noted. Lungs:  Normal respiratory effort, chest expands symmetrically. Lungs are clear to auscultation, no crackles or wheezes. Heart:  normal rate and regular rhythm.   Skin:  no rashes.   Cervical Nodes:  No lymphadenopathy noted   Impression & Recommendations:  Problem # 1:  SINUSITIS, ACUTE (ICD-461.9) Assessment New Follow up with Dr Cato Mulligan if no better in 2 weeks. His updated medication list for this problem includes:    Amoxicillin 875 Mg Tabs (Amoxicillin) ..... One by mouth two times a day for 10d ays  Complete Medication List: 1)  Amlodipine Besylate 10 Mg Tabs (Amlodipine besylate) .Marland Kitchen.. 1 tablet by mouth once a day 2)  Cozaar 100 Mg Tabs (Losartan potassium) .Marland Kitchen.. 1 by mouth once daily 3)  Glucophage 1000 Mg Tabs (Metformin hcl) .... Take 1 tablet by mouth twice a day 4)  Hydrochlorothiazide 25 Mg Tabs (Hydrochlorothiazide) .... Take 1 tablet by mouth every morning 5)  Prilosec 20 Mg Cpdr (Omeprazole) .... Take 1 capsule by mouth twice a day 6)  Simvastatin 40 Mg Tabs (Simvastatin) .... Take 1 tablet by mouth at bedtime 7)  Levothyroxine Sodium 50 Mcg Tabs (Levothyroxine sodium) .... Take 1 tablet by mouth once a day 8)  Lantus 100 Unit/ml Soln (Insulin glargine) .... 80 subcutaneously once daily hs 9)  Hydrocortisone 2.5 % Lotn (Hydrocortisone) .... Once daily as needed 10)  Freestyle Lite Test Strp (Glucose blood) .... Use two times daily as directed 11)  Klor-con M20 20 Meq Cr-tabs (Potassium chloride crys cr) .... Take 1 tablet by mouth once a day 12)  Trazodone Hcl 50 Mg Tabs (Trazodone hcl) .Marland Kitchen.. 1-2 q day 13)  Citalopram Hydrobromide 20 Mg Tabs (Citalopram hydrobromide) .Marland KitchenMarland KitchenMarland Kitchen  Take 1 tablet by mouth once a day 14)  Amoxicillin 875 Mg Tabs (Amoxicillin) .... One by mouth two times a day for 10d ays  Patient Instructions: 1)  Acute sinusitis symptoms for less than 10 days are not helped by  antibiotics. Use warm moist compresses, and over the counter decongestants( only as directed). Call if no improvement in 5-7 days, sooner if increasing pain, fever, or new symptoms.  Prescriptions: AMOXICILLIN 875 MG TABS (AMOXICILLIN) one by mouth two times a day for 10d ays  #20 x 0   Entered and Authorized by:   Evelena Peat MD   Signed by:   Evelena Peat MD on 11/28/2010   Method used:   Electronically to        [facility]  [address] (retail)       [address]       [place], [place]  [address]       Ph: [phone]       Fax: [phone]   RxID:   [id number]    Orders Added: 1)  Est. Patient Level III [25956]

## 2011-01-03 NOTE — Assessment & Plan Note (Signed)
Summary: 1 month fup//ccm   Vital Signs:  Patient profile:   69 year old male Weight:      266 pounds BMI:     36.21 Temp:     98.9 degrees F oral Pulse rate:   88 / minute Pulse rhythm:   regular BP sitting:   136 / 76  (left arm) Cuff size:   large  Vitals Entered By: Alfred Levins, CMA (October 31, 2010 8:00 AM) CC: eval. for lapband surgery   Primary Care Provider:  Birdie Sons MD  CC:  eval. for lapband surgery.  History of Present Illness:  Follow-Up Visit      This is a 70 year old man who presents for Follow-up visit.  The patient denies chest pain and palpitations.  Since the last visit the patient notes no new problems or concerns.  The patient reports taking meds as prescribed.  When questioned about possible medication side effects, the patient notes none.  here for f/u regarding bariatric surgery. he thinks he has lost a few pounds.   All other systems reviewed and were negative except ears feel "stopped up" r>L  Current Problems (verified): 1)  Depression  (ICD-311) 2)  Obesity  (ICD-278.00) 3)  Personal Hx Colonic Polyps  (ICD-V12.72) 4)  Erectile Dysfunction  (ICD-302.72) 5)  Obstructive Sleep Apnea  (ICD-327.23) 6)  Psa, Increased  (ICD-790.93) 7)  Diabetic Retinopathy  (ICD-250.50) 8)  Diverticulosis, Colon  (ICD-562.10) 9)  Hypertension  (ICD-401.9) 10)  Hyperlipidemia  (ICD-272.4) 11)  Gerd  (ICD-530.81) 12)  Diabetes Mellitus, Type II  (ICD-250.00)  Current Medications (verified): 1)  Amlodipine Besylate 10 Mg Tabs (Amlodipine Besylate) .Marland Kitchen.. 1 Tablet By Mouth Once A Day 2)  Cozaar 100 Mg Tabs (Losartan Potassium) .Marland Kitchen.. 1 By Mouth Once Daily 3)  Glucophage 1000 Mg Tabs (Metformin Hcl) .... Take 1 Tablet By Mouth Twice A Day 4)  Hydrochlorothiazide 25 Mg Tabs (Hydrochlorothiazide) .... Take 1 Tablet By Mouth Every Morning 5)  Prilosec 20 Mg Cpdr (Omeprazole) .... Take 1 Capsule By Mouth Twice A Day 6)  Simvastatin 40 Mg Tabs (Simvastatin)  .... Take 1 Tablet By Mouth At Bedtime 7)  Levothyroxine Sodium 50 Mcg  Tabs (Levothyroxine Sodium) .... Take 1 Tablet By Mouth Once A Day 8)  Lantus 100 Unit/ml  Soln (Insulin Glargine) .... 80 Subcutaneously Once Daily Hs 9)  Hydrocortisone 2.5 %  Lotn (Hydrocortisone) .... Once Daily As Needed 10)  Freestyle Lite Test  Strp (Glucose Blood) .... Use Two Times Daily As Directed 11)  Klor-Con M20 20 Meq Cr-Tabs (Potassium Chloride Crys Cr) .... Take 1 Tablet By Mouth Once A Day 12)  Trazodone Hcl 50 Mg Tabs (Trazodone Hcl) .Marland Kitchen.. 1-2 Q Day 13)  Citalopram Hydrobromide 20 Mg Tabs (Citalopram Hydrobromide) .... Take One Tablet By Mouth Daily  Allergies (verified): 1)  Morphine Sulfate (Morphine Sulfate)  Past History:  Past Medical History: Last updated: 09/21/2009 Colonic polyps, hx of Diabetes mellitus, type II Diverticulosis, colon GERD Hyperlipidemia Hypertension OSA elevated PSA diabetic retinopathy Hypothyroidism Irritable Bowel Syndrome Obesity Depression  Past Surgical History: Last updated: 08/19/2008 R leg fx--ORIF Cholecystectomy Hemorrhoidectomy  Family History: Last updated: 08/27/2008 father deceased MVA mother stomach CA age 75 Diabetes- Patient  Social History: Last updated: Aug 27, 2008 Retired Married Regular exercise-no Caffeine- sodas  Risk Factors: Exercise: no (12/30/2007)  Risk Factors: Smoking Status: quit (07/23/2010)  Physical Exam  General:  well-developed, overweight male in no acute distress. HEENT exam atraumatic, normocephalic symmetric her  muscles are intact. Neck is supple. Chest her auscultation cardiac exam S1-S2 are regular. Abdominal exam overweight, to bowel sounds, soft and nontender. Extremities no clubbing cyanosis or edema.   Impression & Recommendations:  Problem # 1:  DIABETES MELLITUS, TYPE II (ICD-250.00)  check labs today His updated medication list for this problem includes:    Cozaar 100 Mg Tabs (Losartan  potassium) .Marland Kitchen... 1 by mouth once daily    Glucophage 1000 Mg Tabs (Metformin hcl) .Marland Kitchen... Take 1 tablet by mouth twice a day    Lantus 100 Unit/ml Soln (Insulin glargine) .Marland KitchenMarland KitchenMarland KitchenMarland Kitchen 80 subcutaneously once daily hs  Labs Reviewed: Creat: 1.0 (05/23/2010)     Last Eye Exam: diabetic retinopathy (09/01/2010) Reviewed HgBA1c results: 8.0 (05/23/2010)  7.5 (01/09/2010)  Orders: TLB-CBC Platelet - w/Differential (85025-CBCD) TLB-A1C / Hgb A1C (Glycohemoglobin) (83036-A1C) Venipuncture (16109)  Problem # 2:  HYPERTENSION (ICD-401.9) reasonable control. Continue current medications. His updated medication list for this problem includes:    Amlodipine Besylate 10 Mg Tabs (Amlodipine besylate) .Marland Kitchen... 1 tablet by mouth once a day    Cozaar 100 Mg Tabs (Losartan potassium) .Marland Kitchen... 1 by mouth once daily    Hydrochlorothiazide 25 Mg Tabs (Hydrochlorothiazide) .Marland Kitchen... Take 1 tablet by mouth every morning  BP today: 136/76 Prior BP: 128/72 (10/01/2010)  Labs Reviewed: K+: 4.2 (05/23/2010) Creat: : 1.0 (05/23/2010)   Chol: 159 (05/23/2010)   HDL: 39.90 (05/23/2010)   LDL: 77 (09/14/2009)   TG: 238.0 (05/23/2010)  Orders: TLB-BMP (Basic Metabolic Panel-BMET) (80048-METABOL)  Problem # 3:  HYPERLIPIDEMIA (ICD-272.4)  controlled continue current medications  His updated medication list for this problem includes:    Simvastatin 40 Mg Tabs (Simvastatin) .Marland Kitchen... Take 1 tablet by mouth at bedtime  Labs Reviewed: SGOT: 38 (05/23/2010)   SGPT: 45 (05/23/2010)   HDL:39.90 (05/23/2010), 40.90 (01/09/2010)  LDL:77 (09/14/2009), 90 (04/24/2009)  Chol:159 (05/23/2010), 138 (01/09/2010)  Trig:238.0 (05/23/2010), 209.0 (01/09/2010)  Orders: TLB-Lipid Panel (80061-LIPID) TLB-Hepatic/Liver Function Pnl (80076-HEPATIC)  Complete Medication List: 1)  Amlodipine Besylate 10 Mg Tabs (Amlodipine besylate) .Marland Kitchen.. 1 tablet by mouth once a day 2)  Cozaar 100 Mg Tabs (Losartan potassium) .Marland Kitchen.. 1 by mouth once daily 3)   Glucophage 1000 Mg Tabs (Metformin hcl) .... Take 1 tablet by mouth twice a day 4)  Hydrochlorothiazide 25 Mg Tabs (Hydrochlorothiazide) .... Take 1 tablet by mouth every morning 5)  Prilosec 20 Mg Cpdr (Omeprazole) .... Take 1 capsule by mouth twice a day 6)  Simvastatin 40 Mg Tabs (Simvastatin) .... Take 1 tablet by mouth at bedtime 7)  Levothyroxine Sodium 50 Mcg Tabs (Levothyroxine sodium) .... Take 1 tablet by mouth once a day 8)  Lantus 100 Unit/ml Soln (Insulin glargine) .... 80 subcutaneously once daily hs 9)  Hydrocortisone 2.5 % Lotn (Hydrocortisone) .... Once daily as needed 10)  Freestyle Lite Test Strp (Glucose blood) .... Use two times daily as directed 11)  Klor-con M20 20 Meq Cr-tabs (Potassium chloride crys cr) .... Take 1 tablet by mouth once a day 12)  Trazodone Hcl 50 Mg Tabs (Trazodone hcl) .Marland Kitchen.. 1-2 q day 13)  Citalopram Hydrobromide 20 Mg Tabs (Citalopram hydrobromide) .... Take 1 tablet by mouth once a day   Orders Added: 1)  Est. Patient Level IV [60454] 2)  TLB-Lipid Panel [80061-LIPID] 3)  TLB-Hepatic/Liver Function Pnl [80076-HEPATIC] 4)  TLB-BMP (Basic Metabolic Panel-BMET) [80048-METABOL] 5)  TLB-CBC Platelet - w/Differential [85025-CBCD] 6)  TLB-A1C / Hgb A1C (Glycohemoglobin) [83036-A1C] 7)  Venipuncture [09811]  Appended Document: Orders Update  Clinical Lists Changes  Orders: Added new Service order of Specimen Handling (16109) - Signed      Appended Document: 1 month fup//ccm pt was here for f/u regarding weight loss surgery >1/2 the visit was spent counseling regarding weight loss, diet and exercise.

## 2011-01-03 NOTE — Assessment & Plan Note (Signed)
Summary: 1 MNTH ROV//SLM   Vital Signs:  Patient profile:   70 year old male Weight:      265 pounds Temp:     98.7 degrees F oral Pulse rate:   88 / minute Pulse rhythm:   regular BP sitting:   124 / 82  (left arm) Cuff size:   large  Vitals Entered By: Alfred Levins, CMA (December 24, 2010 8:37 AM) CC: f/u, bilateral earache and cough   Primary Care Provider:  Birdie Sons MD  CC:  f/u and bilateral earache and cough.  History of Present Illness: pt is here to f/u multiple medical problems obesity---he has been unsuccessful with weight loss--i again took the opportunity to counsel him on appropriate diet and exercise (lifestyle modifications).  hytn---tolerating meds  lipids---tolerating meds  complains of bilateral ear discomfort---comes and goes---he admits to some discomfort--tried a coutse of ABX---no relief---reviewed dr burchette's note. he complains of associated sinus congestion and occassional epistaxis  All other systems reviewed and were negative except for some sinus congestion.   Current Problems (verified): 1)  Depression  (ICD-311) 2)  Obesity  (ICD-278.00) 3)  Personal Hx Colonic Polyps  (ICD-V12.72) 4)  Erectile Dysfunction  (ICD-302.72) 5)  Obstructive Sleep Apnea  (ICD-327.23) 6)  Psa, Increased  (ICD-790.93) 7)  Diabetic Retinopathy  (ICD-250.50) 8)  Diverticulosis, Colon  (ICD-562.10) 9)  Hypertension  (ICD-401.9) 10)  Hyperlipidemia  (ICD-272.4) 11)  Gerd  (ICD-530.81) 12)  Diabetes Mellitus, Type II  (ICD-250.00)  Current Medications (verified): 1)  Amlodipine Besylate 10 Mg Tabs (Amlodipine Besylate) .Marland Kitchen.. 1 Tablet By Mouth Once A Day 2)  Cozaar 100 Mg Tabs (Losartan Potassium) .Marland Kitchen.. 1 By Mouth Once Daily 3)  Glucophage 1000 Mg Tabs (Metformin Hcl) .... Take 1 Tablet By Mouth Twice A Day 4)  Hydrochlorothiazide 25 Mg Tabs (Hydrochlorothiazide) .... Take 1 Tablet By Mouth Every Morning 5)  Prilosec 20 Mg Cpdr (Omeprazole) .... Take 1  Capsule By Mouth Twice A Day 6)  Simvastatin 40 Mg Tabs (Simvastatin) .... Take 1 Tablet By Mouth At Bedtime 7)  Levothyroxine Sodium 50 Mcg  Tabs (Levothyroxine Sodium) .... Take 1 Tablet By Mouth Once A Day 8)  Lantus 100 Unit/ml  Soln (Insulin Glargine) .... 80 Subcutaneously Once Daily Hs 9)  Hydrocortisone 2.5 %  Lotn (Hydrocortisone) .... Once Daily As Needed 10)  Freestyle Lite Test  Strp (Glucose Blood) .... Use Two Times Daily As Directed 11)  Klor-Con M20 20 Meq Cr-Tabs (Potassium Chloride Crys Cr) .... Take 1 Tablet By Mouth Once A Day 12)  Trazodone Hcl 50 Mg Tabs (Trazodone Hcl) .Marland Kitchen.. 1-2 Q Day 13)  Citalopram Hydrobromide 20 Mg Tabs (Citalopram Hydrobromide) .... Take 1 Tablet By Mouth Once A Day  Allergies (verified): 1)  Morphine Sulfate (Morphine Sulfate)  Past History:  Past Medical History: Last updated: 09/21/2009 Colonic polyps, hx of Diabetes mellitus, type II Diverticulosis, colon GERD Hyperlipidemia Hypertension OSA elevated PSA diabetic retinopathy Hypothyroidism Irritable Bowel Syndrome Obesity Depression  Past Surgical History: Last updated: 08/19/2008 R leg fx--ORIF Cholecystectomy Hemorrhoidectomy  Family History: Last updated: 09/04/08 father deceased MVA mother stomach CA age 21 Diabetes- Patient  Social History: Last updated: 09-04-08 Retired Married Regular exercise-no Caffeine- sodas  Risk Factors: Exercise: no (12/30/2007)  Risk Factors: Smoking Status: quit (07/23/2010)  Physical Exam  General:  Well-developed,well-nourished,in no acute distress; alert,appropriate and cooperative throughout examination Head:  normocephalic and atraumatic.   Eyes:  pupils equal and pupils round.   Ears:  R ear normal and L ear normal.   Neck:  No deformities, masses, or tenderness noted. Lungs:  normal respiratory effort and no intercostal retractions.   Heart:  normal rate and regular rhythm.   Abdomen:  soft and non-tender.   obese Msk:  No deformity or scoliosis noted of thoracic or lumbar spine.   Neurologic:  cranial nerves II-XII intact and gait normal.     Impression & Recommendations:  Problem # 1:  OBESITY (ICD-278.00) counseled on diet and exercise low fat diet and low calorie diet  Problem # 2:  HYPERTENSION (ICD-401.9) fair control continue current medications  His updated medication list for this problem includes:    Amlodipine Besylate 10 Mg Tabs (Amlodipine besylate) .Marland Kitchen... 1 tablet by mouth once a day    Cozaar 100 Mg Tabs (Losartan potassium) .Marland Kitchen... 1 by mouth once daily    Hydrochlorothiazide 25 Mg Tabs (Hydrochlorothiazide) .Marland Kitchen... Take 1 tablet by mouth every morning  BP today: 124/82 Prior BP: 140/78 (11/28/2010)  Labs Reviewed: K+: 3.8 (10/31/2010) Creat: : 1.0 (10/31/2010)   Chol: 146 (10/31/2010)   HDL: 38.80 (10/31/2010)   LDL: 77 (09/14/2009)   TG: 252.0 (10/31/2010)  Problem # 3:  DIABETES MELLITUS, TYPE II (ICD-250.00) not well controlled needs to lose weight desperately His updated medication list for this problem includes:    Cozaar 100 Mg Tabs (Losartan potassium) .Marland Kitchen... 1 by mouth once daily    Glucophage 1000 Mg Tabs (Metformin hcl) .Marland Kitchen... Take 1 tablet by mouth twice a day    Lantus 100 Unit/ml Soln (Insulin glargine) .Marland KitchenMarland KitchenMarland KitchenMarland Kitchen 80 subcutaneously once daily hs  Labs Reviewed: Creat: 1.0 (10/31/2010)     Last Eye Exam: diabetic retinopathy (09/01/2010) Reviewed HgBA1c results: 8.3 (10/31/2010)  8.0 (05/23/2010)  Problem # 4:  HYPERLIPIDEMIA (ICD-272.4)  His updated medication list for this problem includes:    Simvastatin 40 Mg Tabs (Simvastatin) .Marland Kitchen... Take 1 tablet by mouth at bedtime  Labs Reviewed: SGOT: 30 (10/31/2010)   SGPT: 41 (10/31/2010)   HDL:38.80 (10/31/2010), 39.90 (05/23/2010)  LDL:77 (09/14/2009), 90 (04/24/2009)  Chol:146 (10/31/2010), 159 (05/23/2010)  Trig:252.0 (10/31/2010), 238.0 (05/23/2010)  Problem # 5:  SINUSITIS, CHRONIC (ICD-473.9)  his sxs  are best described by chronic sinusitis check CT scan  Orders: Radiology Referral (Radiology)  Complete Medication List: 1)  Amlodipine Besylate 10 Mg Tabs (Amlodipine besylate) .Marland Kitchen.. 1 tablet by mouth once a day 2)  Cozaar 100 Mg Tabs (Losartan potassium) .Marland Kitchen.. 1 by mouth once daily 3)  Glucophage 1000 Mg Tabs (Metformin hcl) .... Take 1 tablet by mouth twice a day 4)  Hydrochlorothiazide 25 Mg Tabs (Hydrochlorothiazide) .... Take 1 tablet by mouth every morning 5)  Prilosec 20 Mg Cpdr (Omeprazole) .... Take 1 capsule by mouth twice a day 6)  Simvastatin 40 Mg Tabs (Simvastatin) .... Take 1 tablet by mouth at bedtime 7)  Levothyroxine Sodium 50 Mcg Tabs (Levothyroxine sodium) .... Take 1 tablet by mouth once a day 8)  Lantus 100 Unit/ml Soln (Insulin glargine) .... 80 subcutaneously once daily hs 9)  Hydrocortisone 2.5 % Lotn (Hydrocortisone) .... Once daily as needed 10)  Freestyle Lite Test Strp (Glucose blood) .... Use two times daily as directed 11)  Klor-con M20 20 Meq Cr-tabs (Potassium chloride crys cr) .... Take 1 tablet by mouth once a day 12)  Trazodone Hcl 50 Mg Tabs (Trazodone hcl) .Marland Kitchen.. 1-2 q day 13)  Citalopram Hydrobromide 20 Mg Tabs (Citalopram hydrobromide) .... Take 1 tablet by mouth once a day  Orders Added: 1)  Radiology Referral [Radiology] 2)  Est. Patient Level IV [id number]

## 2011-02-18 ENCOUNTER — Ambulatory Visit: Payer: Self-pay | Admitting: Internal Medicine

## 2011-02-21 ENCOUNTER — Encounter: Payer: Medicare Other | Attending: Surgery

## 2011-02-21 DIAGNOSIS — Z01818 Encounter for other preprocedural examination: Secondary | ICD-10-CM | POA: Insufficient documentation

## 2011-02-21 DIAGNOSIS — Z713 Dietary counseling and surveillance: Secondary | ICD-10-CM | POA: Insufficient documentation

## 2011-02-25 ENCOUNTER — Encounter: Payer: Self-pay | Admitting: Internal Medicine

## 2011-02-25 ENCOUNTER — Ambulatory Visit: Payer: Self-pay | Admitting: Internal Medicine

## 2011-02-26 ENCOUNTER — Encounter: Payer: Self-pay | Admitting: Internal Medicine

## 2011-02-26 ENCOUNTER — Ambulatory Visit (INDEPENDENT_AMBULATORY_CARE_PROVIDER_SITE_OTHER): Payer: Medicare Other | Admitting: Internal Medicine

## 2011-02-26 DIAGNOSIS — G4733 Obstructive sleep apnea (adult) (pediatric): Secondary | ICD-10-CM

## 2011-02-26 DIAGNOSIS — E785 Hyperlipidemia, unspecified: Secondary | ICD-10-CM

## 2011-02-26 DIAGNOSIS — K219 Gastro-esophageal reflux disease without esophagitis: Secondary | ICD-10-CM

## 2011-02-26 DIAGNOSIS — I10 Essential (primary) hypertension: Secondary | ICD-10-CM

## 2011-02-26 DIAGNOSIS — R972 Elevated prostate specific antigen [PSA]: Secondary | ICD-10-CM

## 2011-02-26 DIAGNOSIS — E119 Type 2 diabetes mellitus without complications: Secondary | ICD-10-CM

## 2011-02-26 LAB — CBC WITH DIFFERENTIAL/PLATELET
Basophils Absolute: 0.1 10*3/uL (ref 0.0–0.1)
Basophils Relative: 0.7 % (ref 0.0–3.0)
Eosinophils Absolute: 0.2 10*3/uL (ref 0.0–0.7)
Eosinophils Relative: 1.8 % (ref 0.0–5.0)
HCT: 46.5 % (ref 39.0–52.0)
Hemoglobin: 15.8 g/dL (ref 13.0–17.0)
Lymphocytes Relative: 25.7 % (ref 12.0–46.0)
Lymphs Abs: 2.9 10*3/uL (ref 0.7–4.0)
MCHC: 34 g/dL (ref 30.0–36.0)
MCV: 82.9 fl (ref 78.0–100.0)
Monocytes Absolute: 0.8 10*3/uL (ref 0.1–1.0)
Monocytes Relative: 7.1 % (ref 3.0–12.0)
Neutro Abs: 7.3 10*3/uL (ref 1.4–7.7)
Neutrophils Relative %: 64.7 % (ref 43.0–77.0)
Platelets: 242 10*3/uL (ref 150.0–400.0)
RBC: 5.6 Mil/uL (ref 4.22–5.81)
RDW: 14.9 % — ABNORMAL HIGH (ref 11.5–14.6)
WBC: 11.2 10*3/uL — ABNORMAL HIGH (ref 4.5–10.5)

## 2011-02-26 LAB — HEPATIC FUNCTION PANEL
ALT: 38 U/L (ref 0–53)
AST: 31 U/L (ref 0–37)
Albumin: 4.2 g/dL (ref 3.5–5.2)
Alkaline Phosphatase: 50 U/L (ref 39–117)
Bilirubin, Direct: 0.2 mg/dL (ref 0.0–0.3)
Total Bilirubin: 0.6 mg/dL (ref 0.3–1.2)
Total Protein: 6.8 g/dL (ref 6.0–8.3)

## 2011-02-26 LAB — BASIC METABOLIC PANEL
BUN: 25 mg/dL — ABNORMAL HIGH (ref 6–23)
CO2: 29 mEq/L (ref 19–32)
Calcium: 9.4 mg/dL (ref 8.4–10.5)
Chloride: 103 mEq/L (ref 96–112)
Creatinine, Ser: 1 mg/dL (ref 0.4–1.5)
GFR: 81.92 mL/min (ref >60.00)
Glucose, Bld: 101 mg/dL — ABNORMAL HIGH (ref 70–99)
Potassium: 3.9 mEq/L (ref 3.5–5.1)
Sodium: 140 mEq/L (ref 135–145)

## 2011-02-26 LAB — LDL CHOLESTEROL, DIRECT: Direct LDL: 104.4 mg/dL

## 2011-02-26 LAB — LIPID PANEL
Cholesterol: 168 mg/dL (ref 0–200)
HDL: 43.7 mg/dL (ref >39.00)
Total CHOL/HDL Ratio: 4
Triglycerides: 220 mg/dL — ABNORMAL HIGH (ref 0.0–149.0)
VLDL: 44 mg/dL — ABNORMAL HIGH (ref 0.0–40.0)

## 2011-02-26 LAB — PSA: PSA: 4.18 ng/mL — ABNORMAL HIGH (ref 0.10–4.00)

## 2011-02-26 LAB — TSH: TSH: 1.54 u[IU]/mL (ref 0.35–5.50)

## 2011-02-26 LAB — HEMOGLOBIN A1C: Hgb A1c MFr Bld: 7.3 % — ABNORMAL HIGH (ref 4.6–6.5)

## 2011-02-26 NOTE — Assessment & Plan Note (Addendum)
Continue current meds 

## 2011-02-26 NOTE — Assessment & Plan Note (Addendum)
Using cpap  Suspect OSA will resolve with weight loss

## 2011-02-26 NOTE — Progress Notes (Signed)
  Subjective:    Patient ID: [name], male    DOB: 04-02-1941, 70 y.o.   MRN: [id number]  HPI   patient comes in for followup of multiple medical problems including type 2 diabetes, hyperlipidemia, hypertension. The patient does not check blood sugar or blood pressure at home. The patetient does not follow an exercise or diet program. The patient denies any polyuria, polydipsia.  In the past the patient has gone to diabetic treatment center. The patient is tolerating medications  Without difficulty. The patient does admit to medication compliance.  He has lost some weight prior to bariatric surgery  Past Medical History  Diagnosis Date  . Diabetes mellitus   . Diverticulosis of colon   . GERD (gastroesophageal reflux disease)   . Hyperlipidemia   . Hypertension   . OSA (obstructive sleep apnea)   . Colon polyps   . Elevated PSA   . Diabetic retinopathy   . Hypothyroidism   . IBS (irritable bowel syndrome)   . Depression   . Macular degeneration     followed by ophthalmology   Past Surgical History  Procedure Date  . Orif tibia fracture   . Cholecystectomy   . Hemorrhoid surgery     reports that he quit smoking about 30 years ago. He does not have any smokeless tobacco history on file. His alcohol and drug histories not on file. family history includes Alcohol abuse in his brother; Cancer (age of onset:72) in his mother; Heart attack in his paternal uncle; and Hypertension in his mother and paternal uncle. Allergies  Allergen Reactions  . Morphine Sulfate     REACTION: rash: itching     Review of Systems  patient denies chest pain, shortness of breath, orthopnea. Denies lower extremity edema, abdominal pain, change in appetite, change in bowel movements. Patient denies rashes, musculoskeletal complaints. No other specific complaints in a complete review of systems.      Objective:     Physical Exam  well-developed well-nourished male in no acute distress. HEENT  exam atraumatic, normocephalic, neck supple without jugular venous distention. Chest clear to auscultation cardiac exam S1-S2 are regular. Abdominal exam overweight with bowel sounds, soft and nontender. Extremities no edema. Neurologic exam is alert with a normal gait.        Assessment & Plan:

## 2011-02-26 NOTE — Assessment & Plan Note (Addendum)
Lab Results  Component Value Date   NA 137 10/31/2010   K 3.8 10/31/2010   CL 100 10/31/2010   CO2 29 10/31/2010   BUN 16 10/31/2010   CREATININE 1.0 10/31/2010    BP Readings from Last 3 Encounters:  02/26/11 122/76  12/24/10 124/82  11/28/10 140/78    Assessment:  Hypertension control:  controlled   Progress toward goals:  improved Barriers to meeting goals:  no barriers identified  Plan: Hypertension treatment:  continue current medications

## 2011-02-26 NOTE — Assessment & Plan Note (Addendum)
Lab Results  Component Value Date   HGBA1C 8.3* 10/31/2010   CREATININE 1.0 10/31/2010   MICROALBUR 0.4 12/24/2007   MICRALBCREAT 8.0 12/24/2007   CHOL 146 10/31/2010   HDL 38.80* 10/31/2010   TRIG 252.0* 10/31/2010    Last eye exam and foot exam:    Component Value Date/Time   HMDIABEYEEXA diabetic retinopathy 09/01/2010     Assessment: Diabetes control: improved Progress toward goals: improved Barriers to meeting  goals: nonadherence to medications  Plan: Diabetes treatment:  continue current medications Refer to: none Instruction/counseling given:  reminded to get eye exam, discussed foot care and discussed the need for weight loss   He is scheduled for bariatric surgery next week

## 2011-02-28 DIAGNOSIS — R972 Elevated prostate specific antigen [PSA]: Secondary | ICD-10-CM | POA: Insufficient documentation

## 2011-02-28 NOTE — Assessment & Plan Note (Addendum)
Controlled. I suspect will be even better after weight loss .

## 2011-03-01 ENCOUNTER — Other Ambulatory Visit: Payer: Self-pay | Admitting: Surgery

## 2011-03-01 ENCOUNTER — Encounter (HOSPITAL_COMMUNITY): Payer: Medicare Other

## 2011-03-01 ENCOUNTER — Other Ambulatory Visit (HOSPITAL_COMMUNITY): Payer: Medicare Other

## 2011-03-01 LAB — SURGICAL PCR SCREEN
MRSA, PCR: NEGATIVE
Staphylococcus aureus: NEGATIVE

## 2011-03-05 ENCOUNTER — Inpatient Hospital Stay (HOSPITAL_COMMUNITY)
Admission: RE | Admit: 2011-03-05 | Discharge: 2011-03-06 | DRG: 621 | Disposition: A | Discharge status: Home / Self Care | Payer: Medicare Other | Source: Ambulatory Visit | Attending: Surgery | Admitting: Surgery

## 2011-03-05 DIAGNOSIS — E119 Type 2 diabetes mellitus without complications: Secondary | ICD-10-CM | POA: Diagnosis present

## 2011-03-05 DIAGNOSIS — K449 Diaphragmatic hernia without obstruction or gangrene: Secondary | ICD-10-CM | POA: Diagnosis present

## 2011-03-05 DIAGNOSIS — G4733 Obstructive sleep apnea (adult) (pediatric): Secondary | ICD-10-CM | POA: Diagnosis present

## 2011-03-05 DIAGNOSIS — I10 Essential (primary) hypertension: Secondary | ICD-10-CM | POA: Diagnosis present

## 2011-03-05 DIAGNOSIS — Z01812 Encounter for preprocedural laboratory examination: Secondary | ICD-10-CM

## 2011-03-05 HISTORY — PX: LAPAROSCOPIC GASTRIC BANDING: SHX1100

## 2011-03-05 HISTORY — PX: LAPAROSCOPIC GASTRIC BANDING WITH HIATAL HERNIA REPAIR: SHX6351

## 2011-03-05 LAB — GLUCOSE, CAPILLARY
Glucose-Capillary: 104 mg/dL — ABNORMAL HIGH (ref 70–99)
Glucose-Capillary: 111 mg/dL — ABNORMAL HIGH (ref 70–99)
Glucose-Capillary: 135 mg/dL — ABNORMAL HIGH (ref 70–99)
Glucose-Capillary: 136 mg/dL — ABNORMAL HIGH (ref 70–99)
Glucose-Capillary: 147 mg/dL — ABNORMAL HIGH (ref 70–99)
Glucose-Capillary: 148 mg/dL — ABNORMAL HIGH (ref 70–99)

## 2011-03-06 ENCOUNTER — Inpatient Hospital Stay (HOSPITAL_COMMUNITY): Payer: Medicare Other

## 2011-03-06 LAB — GLUCOSE, CAPILLARY
Glucose-Capillary: 101 mg/dL — ABNORMAL HIGH (ref 70–99)
Glucose-Capillary: 118 mg/dL — ABNORMAL HIGH (ref 70–99)
Glucose-Capillary: 116 mg/dL — ABNORMAL HIGH (ref 70–99)

## 2011-03-06 LAB — DIFFERENTIAL
Basophils Absolute: 0 10*3/uL (ref 0.0–0.1)
Basophils Relative: 0 % (ref 0–1)
Eosinophils Absolute: 0.2 10*3/uL (ref 0.0–0.7)
Eosinophils Relative: 2 % (ref 0–5)
Lymphocytes Relative: 24 % (ref 12–46)
Lymphs Abs: 2.7 10*3/uL (ref 0.7–4.0)
Monocytes Absolute: 1 10*3/uL (ref 0.1–1.0)
Monocytes Relative: 9 % (ref 3–12)
Neutro Abs: 7.1 10*3/uL (ref 1.7–7.7)
Neutrophils Relative %: 65 % (ref 43–77)

## 2011-03-06 LAB — HEMOGLOBIN A1C
Hgb A1c MFr Bld: 6.7 % — ABNORMAL HIGH (ref <5.7)
Mean Plasma Glucose: 146 mg/dL — ABNORMAL HIGH (ref <117)

## 2011-03-06 LAB — CBC
HCT: 40.5 % (ref 39.0–52.0)
Hemoglobin: 13.2 g/dL (ref 13.0–17.0)
MCH: 26.6 pg (ref 26.0–34.0)
MCHC: 32.6 g/dL (ref 30.0–36.0)
MCV: 81.5 fL (ref 78.0–100.0)
Platelets: 201 10*3/uL (ref 150–400)
RBC: 4.97 MIL/uL (ref 4.22–5.81)
RDW: 13.9 % (ref 11.5–15.5)
WBC: 10.9 10*3/uL — ABNORMAL HIGH (ref 4.0–10.5)

## 2011-03-06 MED ORDER — IOHEXOL 300 MG/ML  SOLN: 50.0000 mL | Freq: Once | INTRAMUSCULAR | Status: DC | PRN

## 2011-03-07 ENCOUNTER — Telehealth: Payer: Self-pay | Admitting: *Deleted

## 2011-03-07 NOTE — Telephone Encounter (Signed)
Pt aware.

## 2011-03-07 NOTE — Telephone Encounter (Signed)
No reason to treat

## 2011-03-07 NOTE — Telephone Encounter (Signed)
Gave pt lab results, faxed them to Dr Annabell Howells, he said his appt was a month away and wants to know if Dr Cato Mulligan could call him in some [place] for a prostate infection.

## 2011-03-09 LAB — GLUCOSE, CAPILLARY
Glucose-Capillary: 121 mg/dL — ABNORMAL HIGH (ref 70–99)
Glucose-Capillary: 243 mg/dL — ABNORMAL HIGH (ref 70–99)

## 2011-03-18 NOTE — Op Note (Signed)
  [name], [name]               ACCOUNT NO.:  [id number]  MEDICAL RECORD NO.:  [id number]           PATIENT TYPE:  I  LOCATION:  0001                         FACILITY:  [facility]  PHYSICIAN:  Thornton Park. Daphine Deutscher, MD  DATE OF BIRTH:  Sep 25, 1941  DATE OF PROCEDURE:  03/05/2011 DATE OF DISCHARGE:                              OPERATIVE REPORT   PREOPERATIVE DIAGNOSIS:  Morbid obesity with multiple comorbidities including diabetes.  PROCEDURE:  Laparoscopic adjustable gastric banding with Allergan APL system and posterior hiatal hernia repair with one suture.  SURGEON:  Thornton Park. Daphine Deutscher, M.D.  ASSISTANT:  Sharlet Salina T. Hoxworth, M.D.  ANESTHESIA:  General endotracheal.  DESCRIPTION OF PROCEDURE:  This 70 year old white male was taken to room one at [facility] on March 05, 2011, and given general anesthesia.  The abdomen was prepped with PCMX and draped sterilely.  Access to the abdomen was achieved through the left upper quadrant using a 0-degrees 10-mm Opti-Vu without difficulty.  A second 5 was placed lateral to that in standard placement including the 15 placed very obliquely in the right upper quadrant.  A Nathanson retractor was used.  We did the balloon test using the band passer calibration, blowing it up with 10 cc of air, pulling it back and it came up in the esophagus.  We then lowered the balloon.  I dissected posteriorly and identified the right and left crura, lifting the posterior vagus nerve up and retracting the esophagus and then placing a single stitch approximating the crura posteriorly.  After this was completed, we then passed the tubing down again and again repeated the test and the inflated tube held up at the EG junction.  I then went farther down on the right crus and created a window posteriorly, dissected through with the finger and brought it around.  I inserted the APL band and it came around the EG junction without difficulty.  It was snapped over the  calibration tubing which was then removed.  It was then held down while I plicated it with three sutures of Surgidac free needle held in place with tie knots.  Tubing was then brought out through the lower port on the right, connected to the tubing with mesh on the back and implanted in a subcutaneous location.  Wounds were all injected with Marcaine and closed with 4-0 Vicryl, Benzoin and Steri-Strips.  The patient tolerated the procedure well and was taken to the recovery room in satisfactory condition.     Thornton Park Daphine Deutscher, MD     MBM/MEDQ  D:  03/05/2011  T:  03/05/2011  Job:  [id number]  cc:   Valetta Mole. Swords, MD [address] [place] [address]  Electronically Signed by Luretha Murphy MD on 03/18/2011 09:26:50 AM

## 2011-03-19 ENCOUNTER — Encounter: Payer: Medicare Other | Attending: Surgery

## 2011-03-19 DIAGNOSIS — Z01818 Encounter for other preprocedural examination: Secondary | ICD-10-CM | POA: Insufficient documentation

## 2011-03-19 DIAGNOSIS — Z713 Dietary counseling and surveillance: Secondary | ICD-10-CM | POA: Insufficient documentation

## 2011-03-26 ENCOUNTER — Ambulatory Visit (INDEPENDENT_AMBULATORY_CARE_PROVIDER_SITE_OTHER): Payer: Medicare Other | Admitting: Internal Medicine

## 2011-03-26 ENCOUNTER — Encounter: Payer: Self-pay | Admitting: Internal Medicine

## 2011-03-26 DIAGNOSIS — E119 Type 2 diabetes mellitus without complications: Secondary | ICD-10-CM

## 2011-03-26 DIAGNOSIS — I10 Essential (primary) hypertension: Secondary | ICD-10-CM

## 2011-03-26 MED ORDER — SIMVASTATIN 40 MG PO TABS: 20.0000 mg | ORAL_TABLET | Freq: Every day | ORAL | Status: DC

## 2011-03-26 MED ORDER — GLUCOSE BLOOD VI STRP: ORAL_STRIP | Status: DC

## 2011-03-26 MED ORDER — AMLODIPINE BESYLATE 10 MG PO TABS: 5.0000 mg | ORAL_TABLET | Freq: Every day | ORAL | Status: DC

## 2011-03-26 NOTE — Progress Notes (Signed)
  Subjective:    Patient ID: [name], male    DOB: 05-25-1941, 70 y.o.   MRN: [id number]  HPI  F/u gastric banding---he is losing weight and feeling well. Down to 225 pounds  htn---home bps less than 120/60s  DM---home CBGs---100-150 off of insulin  Past Medical History  Diagnosis Date  . Diabetes mellitus   . Diverticulosis of colon   . GERD (gastroesophageal reflux disease)   . Hyperlipidemia   . Hypertension   . OSA (obstructive sleep apnea)   . Colon polyps   . Elevated PSA   . Diabetic retinopathy   . Hypothyroidism   . IBS (irritable bowel syndrome)   . Depression   . Macular degeneration     followed by ophthalmology   Past Surgical History  Procedure Date  . Orif tibia fracture   . Cholecystectomy   . Hemorrhoid surgery   . Laparoscopic gastric banding 03/05/11    weight loss    reports that he quit smoking about 30 years ago. He does not have any smokeless tobacco history on file. His alcohol and drug histories not on file. family history includes Alcohol abuse in his brother; Cancer (age of onset:72) in his mother; Heart attack in his paternal uncle; and Hypertension in his mother and paternal uncle. Allergies  Allergen Reactions  . Morphine Sulfate     REACTION: rash: itching     Review of Systems  patient denies chest pain, shortness of breath, orthopnea. Denies lower extremity edema, abdominal pain, change in appetite, change in bowel movements. Patient denies rashes, musculoskeletal complaints. No other specific complaints in a complete review of systems.      Objective:   Physical Exam  well-developed well-nourished male in no acute distress. HEENT exam atraumatic, normocephalic, neck supple without jugular venous distention. Chest clear to auscultation cardiac exam S1-S2 are regular. Abdominal exam overweight with bowel sounds, soft and nontender. Extremities no edema. Neurologic exam is alert with a normal gait.        Assessment & Plan:

## 2011-03-27 ENCOUNTER — Ambulatory Visit: Payer: Medicare Other | Admitting: Internal Medicine

## 2011-03-28 NOTE — Assessment & Plan Note (Signed)
Controlled See ne w medication list Side effects discussed

## 2011-03-28 NOTE — Assessment & Plan Note (Signed)
curently off of meds due to significant weight loss Continue aggressive weight loss plan

## 2011-04-04 NOTE — Discharge Summary (Signed)
  NAMEJERAMI, TAMMEN               ACCOUNT NO.:  [id number]  MEDICAL RECORD NO.:  [id number]           PATIENT TYPE:  I  LOCATION:  1536                         FACILITY:  [facility]  PHYSICIAN:  Thornton Park. Daphine Deutscher, MD  DATE OF BIRTH:  Apr 10, 1941  DATE OF ADMISSION:  03/05/2011 DATE OF DISCHARGE:  03/06/2011                              DISCHARGE SUMMARY   ADMITTING DIAGNOSIS:  Morbid obesity.  DISCHARGE DIAGNOSIS:  Morbid obesity.  PROCEDURE:  Laparoscopic adjustable gastric banding with Allergan APL system and posterior hiatal hernia repair with one suture.  COURSE IN THE HOSPITAL:  This is a 70 year old white male who underwent the above-mentioned procedure.  Postoperatively, he had a swallow which showed contrast passing without difficulty.  He did well and was ready for discharge on March 06, 2011.  CONDITION ON DISCHARGE:  Good.  FINAL DIAGNOSIS:  Morbid obesity status post laparoscopic adjustable gastric banding. Thornton Park Daphine Deutscher, MD     MBM/MEDQ  D:  03/21/2011  T:  03/21/2011  Job:  [id number]  Electronically Signed by Luretha Murphy MD on 04/04/2011 08:36:52 AM

## 2011-04-05 ENCOUNTER — Ambulatory Visit (INDEPENDENT_AMBULATORY_CARE_PROVIDER_SITE_OTHER): Payer: Medicare Other | Admitting: Urology

## 2011-04-05 DIAGNOSIS — N138 Other obstructive and reflux uropathy: Secondary | ICD-10-CM

## 2011-04-05 DIAGNOSIS — N401 Enlarged prostate with lower urinary tract symptoms: Secondary | ICD-10-CM

## 2011-04-05 DIAGNOSIS — R351 Nocturia: Secondary | ICD-10-CM

## 2011-04-05 DIAGNOSIS — R972 Elevated prostate specific antigen [PSA]: Secondary | ICD-10-CM

## 2011-04-16 NOTE — Op Note (Signed)
[name], [name]               ACCOUNT NO.:  [id number]   MEDICAL RECORD NO.:  [id number]          PATIENT TYPE:  AMB   LOCATION:  NESC                         FACILITY:  [facility]   PHYSICIAN:  Erasmo Leventhal, M.D.DATE OF BIRTH:  January 19, 1941   DATE OF PROCEDURE:  07/28/2009  DATE OF DISCHARGE:                               OPERATIVE REPORT   PREOPERATIVE DIAGNOSES:  1. Right shoulder impingement syndrome.  2. Possible cuff tear.  3. Acromioclavicular arthritis.   POSTOPERATIVE DIAGNOSES:  1. Right shoulder chronic impingement syndrome.  2. Partial rotator cuff tear, 20% to 30% supraspinatus insertion.  3. Acromioclavicular arthritis.   PROCEDURES:  1. Right shoulder glenohumeral diagnostic arthroscopy.  2. Arthroscopic subacromial decompression with acromioplasty,      bursectomy, and CA ligament release.  3. Arthroscopic distal clavicle resection, Mumford procedure.   SURGEON:  Valma Cava, MD   ASSISTANT:  Oneida Alar, PA-C   ANESTHESIA:  Interscalene block, general.   ESTIMATED BLOOD LOSS:  Less than 10 cc.   DRAINS:  None.   COMPLICATIONS:  None.   DISPOSITION:  PACU stable.   PROCEDURE IN DETAIL:  The patient's family and the patient were  counseled in the holding.  Correct side was identified.  IV was started.  Interscalene block administered.  Taken to the Operating Room and placed  under smooth general anesthesia, turned to the left lateral decubitus  position, properly padded and bumped.  Right shoulder was then examined.  Full range of motion is stable.  Prepped with DuraPrep and draped in  sterile fashion.  Overhead shoulder position was utilized, 30 degrees of  abduction, 50 degrees of forward flexion, and 15-20 pounds of  longitudinal traction.  A posterior portal was created and arthroscope  was placed into the glenohumeral joint.  Diagnostic arthroscopy revealed  essentially normal intra-articular anatomy including the synovium,  capsule, and  the rotator cuff, articular cartilage, glenohumeral  ligaments, biceps, and labrum.  Irrigated and the scope was removed.  The subacromial region did reveal very thick subacromial bursa.  Subacromial bursectomy was performed.  The area of the suspected tear  was identified.  It was a 20% to 30% partial thickness tear  approximately 6 to 7 mm in length on the leading edge of the  supraspinatus which did not require repair.  The ArthroCare system was  utilized to release the periosteum of the CA ligament.  A bur was then  placed posteriorly and an anterior-inferior acromioplasty was performed  converting to a flat acromion morphology.  The [facility] joint was found be  markedly osteoarthritic.   The bur was then placed and lateral 5-8 mm of clavicle was removed  circumferentially.  The superior-posterior acromioclavicular capsule was  left intact.  The clavicle was palpated and found to be stable.  Arthroscopic debris was removed.  No other abnormalities were noted.  Irrigated and arthroscopic equipment was removed, and taken out of  traction.  He had normal pulses at the end of the case.  The portals  were closed with 4-0 nylon suture.  At the end of the case, sterile  dressings  applied.  Turned supine, placed into a sling, awakened, taken  to from the Operating Room to the PACU in stable condition.  No  complications or problems.   He will be kept overnight due to sleep apnea and monitoring.   To help with surgical technique and decision making, Mr. Oneida Alar, PA-C  assistance was needed throughout the entire case.           ______________________________  Erasmo Leventhal, M.D.     RAC/MEDQ  D:  07/28/2009  T:  07/28/2009  Job:  [id number]

## 2011-04-16 NOTE — Op Note (Signed)
NAMEWAVERLY, [name]               ACCOUNT NO.:  [id number]   MEDICAL RECORD NO.:  [id number]          PATIENT TYPE:  OIB   LOCATION:  1409                         FACILITY:  [facility]   PHYSICIAN:  Excell Seltzer. Annabell Howells, M.D.    DATE OF BIRTH:  November 02, 1941   DATE OF PROCEDURE:  05/12/2008  DATE OF DISCHARGE:                               OPERATIVE REPORT   PROCEDURE:  Insertion of AMS 700 three-piece inflatable penile  prosthesis.   PREOPERATIVE DIAGNOSIS:  Erectile dysfunction.   POSTOPERATIVE DIAGNOSIS:  Erectile dysfunction.   SURGEON:  Dr. Bjorn Pippin.   ANESTHESIA:  General.   DRAIN:  A 16-French Foley catheter.   BLOOD LOSS:  Approximately 50 mL.   COMPLICATIONS:  None.   INDICATIONS:  Mr. Rebstock is a 70 year old white male with diabetes and  vasculogenic erectile dysfunction who desires penile prosthesis for  therapy.   FINDINGS OF THE PROCEDURE:  The patient was given gentamicin and  vancomycin preoperatively.  He was fitted with PAS hose.  A general  anesthetic was induced.  He was placed in supine position.  His  genitalia and lower abdomen were clipped.  He was scrubbed for 5 minutes  with Betadine soap followed by prep with Betadine solution.  He was then  draped in the usual sterile fashion.  A 16-French Foley catheter was  inserted.  The balloon was filled with 10 mL of sterile fluid.  The  bladder was drained, and the catheter was plugged.   A transverse incision was made over the pubis approximately 6 cm in  width.  The Bovie was then used to divide the subcutaneous tissues down  to the anterior rectus fascia which was identified and opened vertically  in the midline.  The rectus muscle was parted in the midline, and the  posterior sheath was opened.  A pocket was created in the prevesical  space underneath the posterior rectus fascia.  Sponge was inserted to  aid dissection.   At this point, the right corpora cavernosa was exposed anterolaterally  just inferior to  the pubis, and stay sutures were placed at the proposed  corporotomy site using 2-0 PDS.  The left corpora was then exposed in  identical fashion, and stay sutures were placed.  The right corporotomy  was then opened with a knife.  The corporotomy was extended with  Metzenbaum scissors which were then used to create a channel proximally  and distally in the corpora beneath the tunic albuginea.  Sequential  dilators from 8 to 13 mm were then passed proximally and distally  without difficulty.  Once complete dilation had been performed, the  measuring tool was used, and the corporal length was found to be 21 cm.  The corporotomy was irrigated with antibiotic solution.  The left  corporotomy was then created, extended and dilated in identical fashion.  The measurement was once again 21 cm.  With the measurements complete,  we chose an AMS 700 CX cylinder set that was 18 cm x 12 mm and a 100-mL  reservoir.  Three-cm rear tip extenders were felt to be appropriate.  The reservoir was prepped and placed in the prevesical space, and the  fascia was then closed using a running 0 Vicryl, great care being taken  to avoid injury to the device.   At this point, the pump and cylinder set was prepped, and the left  cylinder was placed using the furlough inserter and Mellody Dance needle.  Once  in good position, the right cylinder was placed.  Once the cylinders  were in position, a syringe was used as a surrogate reservoir, and the  device was inflated.  Good position of the prosthetic cylinders was  noted without kinking and with appropriate distal extent beneath the  glans.  The device was then deflated, and the tubing was clamped with a  rubber shod hemostat.  The corporotomies were closed with additional 2-0  PDS sutures, and the stay sutures were also used to close the  corporotomy.  Once the corporotomies had been closed, a pouch was  created in the scrotum on the right side in the most dependent  position  possible.  The pump was then placed into the pouch and secured gently  with a Babcock clamp.  The device was cycled once again using the  surrogate reservoir and then deflated leaving approximately 5 mL of  fluid in the cylinders.  The connection was then made between the  reservoir and the pump tubing using quick connect connectors.  Once the  connections were made, the device was cycled once again with an  excellent erection noted.  The wound was irrigated a final time with  antibiotic solution.  Hemostasis was felt to be adequate.  The sutures  from the cylinder tips were then removed.  The wound was closed using  interrupted 3-0 chromic sutures in the subcutaneous tissues.  The [facility] leads had been removed from the cylinder tubing prior to this.  The  skin was then closed using a running 4-0 Vicryl intracuticular stitch  followed by Steri-Strips.  The device was deflated and then partially  reinflated to provide hemostasis.  The Foley was placed to straight  drainage.  A dressing was applied to the abdominal wound.  Fluff, Kerlix  and a scrotal support were then used to support the penis and scrotum.  The patient's anesthetic was reversed.  He was moved to the recovery  room in stable condition.  There were no complications.      Excell Seltzer. Annabell Howells, M.D.  Electronically Signed     JJW/MEDQ  D:  05/12/2008  T:  05/12/2008  Job:  [id number]   cc:   Valetta Mole. Swords, MD  [address] [place]  [place] [address]

## 2011-04-16 NOTE — Op Note (Signed)
NAMECASHEL, BELLINA               ACCOUNT NO.:  [id number]   MEDICAL RECORD NO.:  [id number]          PATIENT TYPE:  AMB   LOCATION:  DAY                          FACILITY:  [facility]   PHYSICIAN:  Excell Seltzer. Annabell Howells, M.D.    DATE OF BIRTH:  1941-02-20   DATE OF PROCEDURE:  02/02/2008  DATE OF DISCHARGE:                               OPERATIVE REPORT   PROCEDURE:  Transurethral resection of the prostate.   PREOPERATIVE DIAGNOSIS:  Benign prostatic hypertrophy, bladder outlet  obstruction.   POSTOPERATIVE DIAGNOSIS:  Benign prostatic hypertrophy, bladder outlet  obstruction.   SURGEON:  Excell Seltzer. Annabell Howells, M.D.   ANESTHESIA:  General.   SPECIMEN:  Prostate chips.   DRAINS:  22-French three way Foley catheter.   COMPLICATIONS:  None.   INDICATIONS:  Mr. Silversmith is a 69 year old white male with urodynamic  evidence of outlet obstruction and a small capacity unstable bladder.  It was felt, after reviewing the options, TURP would be most appropriate  for him   FINDINGS AND PROCEDURE:  The patient was given Cipro.  He was taken to  the operating room where general anesthetic was induced.  He was fitted  with PAS hose and placed in the lithotomy position.  His perineum and  genitalia were prepped with Betadine solution and he was draped in the  usual sterile fashion.  Cystoscopy was performed with the 22 [place]  scope with the 12 and 70 degrees lenses.  Examination revealed a normal  urethra.  The external sphincter was intact.  The prostatic urethra was  3 cm in length with bilobar hyperplasia and a small median lobe without  intravesical extension. There was obstruction.  Examination of the  bladder revealed mild to moderate trabeculation.  No tumor, stones or  inflammation were noted.  The ureteral orifices were unremarkable and  well away from the bladder neck.   The urethra was then calibrated with 32 [place] sounds and a 28  French continuous flow resectoscope sheath was  inserted.  This was  fitted with an [place] handle, a 12 degrees lens, and 26 loop.  The  prostate was then resected, beginning at the bladder neck.  They were  exposed from 5 to 7 o'clock. The floor of the prostate was then resected  out to and alongside the verumontanum.  The right lobe was resected from  bladder neck to apex out to the capsular fibers and the left lobe  resected in a similar fashion.  The bladder was evacuated free of chips  and residual apical and anterior tissue was resected. The chips were  cleared. Final hemostasis was achieved.  Final inspection revealed  intact ureteral orifices.  No retained chips.  No active bleeding and a  widely patent prostatic urethra.  The scope was removed with a full  bladder. Pressure on the bladder produced an excellent stream.  A 22  French three way Foley catheter was then inserted with the aid of a  catheter guide.  The balloon was filled 30 mL sterile fluid.  The catheter was hand irrigated until clear and placed  to continuous  irrigation and straight drainage.  The patient was taken down from the  lithotomy position, his anesthetic was reversed, he was moved to the  recovery room in stable condition.  There were no complications.      Excell Seltzer. Annabell Howells, M.D.  Electronically Signed     JJW/MEDQ  D:  02/02/2008  T:  02/02/2008  Job:  [id number]   cc:   Valetta Mole. Swords, MD  [address] [place]  [place] [address]

## 2011-04-19 NOTE — Discharge Summary (Signed)
NAMEDONTRAIL, BLACKWELL               ACCOUNT NO.:  [id number]   MEDICAL RECORD NO.:  [id number]          PATIENT TYPE:  INP   LOCATION:  1410                         FACILITY:  [facility]   PHYSICIAN:  Excell Seltzer. Annabell Howells, M.D.    DATE OF BIRTH:  1941/03/28   DATE OF ADMISSION:  02/03/2008  DATE OF DISCHARGE:  02/04/2008                               DISCHARGE SUMMARY   Briefly, Mr. Storie is a 70 year old white male with BPH with bladder  obstruction and bladder instability. It was felt that TURP was indicated  for management of his symptoms. His past history is significant for  sleep apnea, arthritis, depression, diabetes mellitus, esophageal  reflux, hypertension, hypothyroidism, macular degeneration and peptic  ulcer disease.  Surgical history is pertinent for prostate biopsy,  cystoscopy, foot surgery, cholecystectomy, leg surgery and  tonsillectomy. Admission medications included Amlodipine, Cozaar,  glipizide, hydrochlorothiazide, insulin, levothyroxine, metformin and  omeprazole. He had no allergies. For additional details of the history,  please see the H&P in the chart.   HOSPITAL COURSE:  On February 02, 2008, he was taken to the operating room  where he underwent TURP without complications. A 22-French three-way  Foley catheter was left indwelling. On the first postop day on March 4,  his urine was clearing, his CBG revealed a glucose of 234, his  electrolytes were unremarkable.  His IV and CBI were discontinued. On  February 04, 2008, he was felt to be ready for discharge.  His Foley had  been removed, he was voiding without difficult. His CBG was 140.   FINAL DIAGNOSIS:  Benign prostatic hypertrophy with bladder outlet  obstruction, confirmed pathologically. There were no complications  during his admission.   DISCHARGE MEDICATIONS:  Cipro, Vicodin, and Colace in addition to his  home medications.  He was instructed to follow-up in 3-4 weeks. His  disposition is to home.   PROGNOSIS:   Good.   CONDITION:  Improved.      Excell Seltzer. Annabell Howells, M.D.  Electronically Signed     JJW/MEDQ  D:  02/22/2008  T:  02/22/2008  Job:  [id number]

## 2011-04-19 NOTE — Assessment & Plan Note (Signed)
[facility]                         GASTROENTEROLOGY OFFICE NOTE   NAME:RUMLEYJakub, [name]                      MRN:          [id number]  DATE:12/04/2006                            DOB:          [birth year]    [name] comes in says he has been having some discomfort along the side  of his upper left abdomen.  There is some midline all the way around to  his CVA area.  He says it has been going on for about 5 days and that it  is somewhat worse today.  Does not cause any pain or cramping, or any  change in bowel activity, or anything of that sort.  He states it is  like a burning sensation.  It does not hurt to touch, but it feels  uncomfortable.  He has not seen any skin rash or anything of this sort.  He has had no fevers or chills associated with it.  He indicates that he  had a colonoscopic examination 6 months ago at the [place] and they found no  polyps, but he did have some diverticular disease.  He has had no  heartburn, reflux symptoms.  He has continued his medications that  include glipizide, lovastatin, metformin, hydrochlorothiazide,  amlodipine, omeprazole, levothyroxine, simvastatin.   PAST MEDICAL HISTORY:  He has hypertension, diabetes, hypothyroidism,  allergies.  He is status post gallbladder disease and hemorrhoidal  surgery.  He uses a CPAP for his sleep apnea.   FAMILY HISTORY:  His mother had stomach cancer.   SOCIAL HISTORY:  Noncontributory.   PHYSICAL EXAMINATION:  [name] looks about the same as always.  He is somewhat overweight, weighing 250 and 6 feet 1 inch.  Blood  pressure 160/70, pulse 108.  OROPHARYNX:  Negative.  NECK:  Negative.  HEART:  Revealed a regular rhythm without a murmur.  CHEST:  Clear.  ABDOMEN:  Soft.  He had some slight tenderness along his skin from the  midline on the left to the CVA area.  There was no pain on deep  inspiration or movement.  Abdomen otherwise was soft with no mass or  organomegaly.  It  was nontender to deep palpation.  There were no bruits  or rubs.   IMPRESSION:  1. Questionable shingles involving his anterior abdominal cavity, as      described.  2. History of gastroesophageal reflux disease controlled with      medications.  3. Family history of stomach cancer.  4. Irritable bowel syndrome with a history of rectal bleeding in the      past with multiple polyps in the past.  5. Mild to moderate obesity.  6. Hypertension.  7. Hyperlipidemia.  8. Hypothyroidism.  9. Diabetes.  10.Status post cholecystectomy.  11.Sleep apnea.   RECOMMENDATIONS:  As above.  I told him to take some Darvocet-N 100 if  his pain persisted, and I want him to go see Dr. Hetty Ely, his primary  care doctor.  We already set him up with him to be seen in approximately  5 days, when he returns from vacation.     Joni Reining.  Marg[place], MD  Electronically Signed    SML/MedQ  DD: 12/04/2006  DT: 12/04/2006  Job #: [id number]   cc:   Arta Silence, MD

## 2011-04-19 NOTE — Discharge Summary (Signed)
[place]  Patient:    [name], [name]                        MRN: [id number] Adm. Date:  [id number] Disc. Date: 01/05/01 Attending:  Faith Rogue T Dictator:   Mcarthur Rossetti. Angiulli, P.A. CC:         Faith Rogue, M.D.  Trudee Grip, M.D.  Bruce Rexene Edison Swords, M.D. [facility]   Discharge Summary  DISCHARGE DIAGNOSES: 1. Right femur fracture, status post intramedullary nailing on December 24, 2000. 2. Postoperative anemia. 3. Non-insulin-dependent diabetes mellitus. 4. Hypertension. 5. Gastroesophageal reflux disease. 6. Hypothyroidism. 7. History of prostatitis.  HISTORY OF PRESENT ILLNESS:  A 70 year old male admitted on December 24, 2000, after a fall 10 to 12 feet off a ladder.  X-rays of the right femur fracture. Underwent intramedullary nailing on December 24, 2000, per Dr. Despina Hick.  Placed on Coumadin for deep venous thrombosis prophylaxis and touchdown weightbearing.  Followup urology services, Dr. Annabell Howells, for history of prostatitis and microhematuria.  With CT of the abdomen and pelvis negative. He was minimal assist for ambulation, no chest pain, no shortness of breath. Latest INR of 1.6.  Admitted for a comprehensive rehab program.  PAST MEDICAL HISTORY:  See discharge diagnoses.  PAST SURGICAL HISTORY: 1. Tonsillectomy. 2. Right foot surgery.  ALCOHOL AND TOBACCO:  None.  ALLERGIES:  No known drug allergies.  PRIMARY CARE PHYSICIAN:  Dr. Riley Kill.  MEDICATIONS PRIOR TO ADMISSION: 1. Prevacid 30 mg daily. 2. Levaquin 500 mg daily for history of prostatitis. 3. Norvasc 10 mg daily. 4. Glucophage 500 mg b.i.d. 5. Synthroid 0.05 mcg daily.  SOCIAL HISTORY:  Lives with wife.  One level home.  Five steps to entry.  The patient is an Personnel officer.  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  Local children that work. The patient was independent prior to admission.  HOSPITAL COURSE:  The patient did well while in rehabilitation services with therapies  initiated on a b.i.d. basis.  The following issues were followed during patients rehabilitation course:  Pertaining to Mr. Rumleys right femur fracture with intramedullary nailing on December 24, 2000, remained stable.  Surgical site healing nicely.  Staples had been removed.  He was touchdown weightbearing with a walker, now independent in his room. Postoperative anemia was stable with latest hemoglobin 10.6, hematocrit of 30.8.  He continued on Coumadin for deep venous thrombosis prophylaxis.  He would complete Coumadin protocol as advised.  He had a history of non-insulin-dependent diabetes mellitus.  His blood sugars remained controlled.  He continued on his Glucophage.  Blood pressures monitored on Norvasc.  He was maintained on his hormone supplement for hypothyroidism.  It was advised per urology service that he continue his Levaquin for a history of prostatitis.  He had no voiding difficulties.  Overall, for his functional mobility, he was ambulating extended distances with a walker, essentially independent to standby assist in all areas of activities of daily living and dressing, grooming, and homemaking.  Overall, his strength and endurance greatly improved, as he was encouraged of his overall progress.  He was discharged to home.  LABORATORY DATA:  Latest labs showed an INR of 2.0, hemoglobin 10.6, hematocrit 30.8.  Sodium 132, BUN 14, creatinine 0.9, potassium 3.6.  DISCHARGE MEDICATIONS: 1. Coumadin daily with dose to be established at the time of discharge to    complete Coumadin protocol. 2. Norvasc 10 mg daily. 3. Tequin 400 mg daily. 4. Synthroid  50 mcg daily. 5. Protonix 40 mg daily. 6. Glucophage 500 mg b.i.d. 7. OxyContin CR 10 mg q.12h. 8. Tylox p.r.n. pain.  ACTIVITY:  Touchdown weightbearing with walker.  DIET:  An 1800 calorie ADA.  SPECIAL INSTRUCTIONS: 1. No driving. 2. Home health nurse to make arrangements with home health care to monitor     Coumadin.  FOLLOWUP:  Dr. Despina Hick, orthopedic services as advised.DD:  01/02/01 TD:  01/05/01 Job: [id number]

## 2011-04-19 NOTE — Op Note (Signed)
NAMEGAYLE, COLLARD                         ACCOUNT NO.:  [id number]   MEDICAL RECORD NO.:  [id number]                   PATIENT TYPE:  OIB   LOCATION:  NA                                   FACILITY:  [facility]   PHYSICIAN:  Jimmye Norman III, M.D.               DATE OF BIRTH:  11/05/1941   DATE OF PROCEDURE:  08/05/2003  DATE OF DISCHARGE:                                 OPERATIVE REPORT   PREOPERATIVE DIAGNOSIS:  Symptomatic cholelithiasis.   POSTOPERATIVE DIAGNOSIS:  Symptomatic cholelithiasis.   PROCEDURE:  Laparoscopic cholecystectomy with interoperative cholangiogram.   SURGEON:  Jimmye Norman, M.D.   ASSISTANT:  Velora Heckler, M.D.   ANESTHESIA:  General.   ESTIMATED BLOOD LOSS:  Less than 10 mL.   COMPLICATIONS:  None.   CONDITION:  Stable.   FINDINGS:  Normal cholangiogram with tapering and no obstruction, good  proximal flow.  There was evidence of some chronic inflammation of the  gallbladder.  The appendix, which had been a concern of the wife, appeared  to be normal.   PROCEDURE IN DETAIL:  The patient was taken to the operating room and placed  on the table in supine position.  After an adequate endotracheal anesthesia  was administered, she was prepped and draped in the usual sterile manner  exposing the midline of the right upper quadrant.  A supraumbilical  curvilinear incision was made using a #11 blade and taken down to the  midline fascia.  It was through this fascia that a Veress needle was passed  into the peritoneal cavity and confirmed to be in position with the saline  test.  The patient was in reversed Trendelenburg position.  Once the Veress  needle was confirmed to be in adequate position, carbon dioxide insufflation  was instilled into the peritoneal cavity up to a maximum intra-abdominal  pressure of 15 mmHg.  We then passed the 11-12 mm trocar and cannula into  the peritoneal cavity and confirmed its position with the laparoscope and  attached camera light source.  We then passed two right costal margin 5 mm  cannulae and a subxiphoid 11/12 mm cannula under direct vision into the  peritoneal cavity.  With all cannulae in proper position, the patient was  placed in steep reversed Trendelenburg, the left side was tilted down and  the gallbladder dissection begun.   The dome of the gallbladder was grasped using a ratcheted grasper through  the lateral most 5 mm cannula.  There were some omental attachments to the  lateral upper portion of the gallbladder and also the right lobe of the  liver.  These were taken down using electrocautery and scissors.  Once we  had detached the omentum, we bluntly took down more omental adhesions to the  infundibulum of the gallbladder and then dissected out the triangle of Calot  and hepatoduodenal triangle exposing the cystic duct and cystic artery.  The  cystic duct was isolated using a right angle clamp and then subsequently a  proximal clamp placed on the gallbladder side.  We made a  cholecystoduchotomy using the endo-scissors and then passed a Reddick  catheter through an Angiocath in the anterior abdominal wall through the  cholecystoduchotomy and clamped it in place.  The cholangiogram was  performed demonstrating good proximal and distal flow with flow into the  duodenum with no evidence of obstruction.   Once this was done, we removed the catheter, placed three clips on the  distal cystic duct and transected the cystic duct.  We then isolated the  cystic artery in the hepatoduodenal triangle and triangle of Calot, ligated  it proximally with double endoclips, distally with a single clip, and then  transected it.  We then dissected out the gallbladder from its bed with  minimal difficulty and no entrance into the gallbladder.  A gallbladder bag  or retrieval bag did not have to be used to remove the gallbladder.  There  was one posterior branch of the cystic artery which had to be  ligated with  an endoclamp which was minimally difficult.  We cauterized the hepatic bed  with electrocautery on a spatula.  Once we had adequate hemostasis, we  irrigated with a small amount of saline and we brought the gallbladder out  through the supraumbilical site with minimal difficulty . Once all gas had  escaped from the abdominal cavity, we reapproximated the supraumbilical  fascia using a figure-of-eight stitch of 0 Vicryl.  Once this was done, we  injected 0.25% Marcaine with epinephrine at all sites and then closed the  skin using a running subcuticular stitch of 4-0 Vicryl.  Sterile dressings  were applied including Steri-Strips and antibiotic ointment.                                               Kathrin Ruddy, M.D.    JW/MEDQ  D:  08/05/2003  T:  08/05/2003  Job:  [id number]

## 2011-04-19 NOTE — Discharge Summary (Signed)
[facility]  Patient:    [name], [name]                        MRN: [id number] Adm. Date:  [id number] Disc. Date: 12/29/00 Attending:  Trauma, Md Dictator:   Ralene Bathe, P.A. CC:         Excell Seltzer. Annabell Howells, M.D.  [facility]  Trauma Service   Discharge Summary  ADMISSION DIAGNOSES: 1. Right intertrochanteric displaced proximal femur fracture. 2. Non-insulin-dependent diabetes. 3. Chronic prostatitis. 4. Hypothyroidism. 5. Gastroesophageal reflux disease. 6. Hypertension.  DISCHARGE DIAGNOSES: 1. Right intertrochanteric displaced proximal femur fracture. 2. Status post right intramedullary nailing of femur, surgeon Dr. Despina Hick,    assistant Ralene Bathe, P.A.-C. under general anesthesia. 3. Non-insulin-dependent diabetes. 4. Chronic prostatitis. 5. Hypothyroidism. 6. Gastroesophageal reflux disease. 7. Hypertension.  OPERATIONS:  Status post right IM nailing of femur, surgeon Dr. Despina Hick, assistant Ralene Bathe, P.A.-C. under general anesthesia.  CONSULTATIONS:  He was initially admitted to trauma and transferred to our service orthopedics with a GU consult by Dr. Annabell Howells and a rehab consult.  BRIEF HISTORY:  [name] is a 70 year old male who fell 10-12 feet off a ladder earlier on the day of admission landing on his right side.  He had immediate pain and deformity of the right thigh.  Unable to ambulate.  No other head injury or loss of consciousness or other injuries noted.  He was seen by trauma and cleared for surgery.  On x-rays he was found to have an intertrochanteric/proximal femoral shaft fracture with significant displacement.  He was cleared for surgery and taken to the OR for IM nailing.  HOSPITAL COURSE:  The patient was admitted and underwent the above-noted procedure and tolerated this well.  Appropriate IV antibiotics and analgesics provided.  The patient did have a little bit of hematuria with rbcs of  6-10 on admission and a little bit of hematuria noted in the Foley.  The wife was concerned and asked [place] to call his urologist, Dr. Annabell Howells, as he was on chronic treatment for chronic prostatitis.  Dr. Annabell Howells saw and evaluated the patient and monitored his GU care.  He overall did extremely well.  He remained hemodynamically stable.  He did have mild postoperative hyponatremia and treated with fluid restrictions which corrected.  All in all the patient did extremely well medically.  He was slow to ambulate and due to the nature of his fracture was nonweightbearing.  A rehab consult was ordered as the patient would require to be moderately independent prior to discharge home with his wife.  He was felt to be a candidate for inpatient services and on date December 29, 2000, the patients temperature maximum was 100.1 and his vital signs were stable, CBGs were 210 and the incision was clean and dry, the thigh swelling had decreased and he was neurovascularly intact.  On this date he had also been working with therapy and was tolerating distance for only about 20 feet with a standard walker touchdown weightbearing and to 25% partial weightbearing.  On this date a bed was available and he was ready and stable for discharge to [facility].  Of note, the patient was anticoagulated postoperatively with Coumadin for DVT and PE prophylaxis for a total of three weeks.  LABORATORY DATA:  Hemoglobin on admission 14; postoperatively 11.7, 10.5 and 10.4 and stable.  ABG on admission within normal limits.  Pro times and INRs can  be found in the chart and monitored by pharmacy on Coumadin for DVT and PE prophylaxis.  Chemistries showed elevated glucose at 182 on admission and they ranged between 130 to 171.  Toxicology screen on January 24, showed positive for opiate and barbiturates.  Urinalysis showed trace hemoglobin, glycosuria with a few epithelial, 6-10 rbcs and a few bacteria.  CT  of the abdomen showed normal except for calcified gallstones.  CT of pelvis showed normal except for a small amount of air in the urinary bladder.  Right knee and hip films intraoperatively showed intramedullary rod fixation.  Admission x-rays show chest film as a normal chest x-ray; lumbar spine with normal alignment; no acute brain abnormality and no fracture; T-spine also normal and no fracture; C-spine with normal alignment, degenerative disk disease noted at C5-6 and C6-7.  AP view of the pelvis showed a normal study. Hip and femur showed a right proximal two thirds femur fracture with displacement with extension into the intertrochanteric region.  EKG showed a normal sinus rhythm.  CONDITION ON DISCHARGE:  Stable.  DISCHARGE MEDICATIONS AND PLAN: 1. The patient is being discharged to [facility]. 2. He is touchdown weightbearing to 25% partial weightbearing to the right    lower extremity.  He may range the knee. 3. Daily dressing changes. 4. Continue current medications. 5. At recommendation of urology he is on Tequin 400 mg q.d.; he was on    Levaquin chronically at home. 6. We will continue to see the patient p.r.n. in the rehab services. 7. Continue other current medicines. DD:  12/29/00 TD:  12/29/00 Job: [id number]

## 2011-04-19 NOTE — Op Note (Signed)
[facility]  Patient:    [name], [name] Visit Number: [id number] MRN: [id number]          Service Type: DSU Location: DAY Attending Physician:  Loanne Drilling Dictated by:   Ollen Gross, M.D. Proc. Date: 02/03/02 Admit Date:  02/03/2002 Discharge Date: 02/03/2002                             Operative Report  PREOPERATIVE DIAGNOSIS:  Painful hardware, right femur.  POSTOPERATIVE DIAGNOSIS:  Painful hardware, right femur.  PROCEDURE:  Hardware removal, right femur.  SURGEON:  Ollen Gross, M.D.  ASSISTANT:  None.  ANESTHESIA:  General.  ESTIMATED BLOOD LOSS:  Minimal.  DRAINS:  None.  COMPLICATIONS:  None.  CONDITION:  Stable to recovery.  BRIEF CLINICAL NOTE:  [name] is a 70 year old male who had a right femur fracture approximately a year ago treated with intermedullary rodding. He had some discomfort in relation to the interlock screws. We are to take them out today.  DESCRIPTION OF PROCEDURE:  He had successful administration of general anesthetic, the patient is placed in the semilateral position on the operating table and his right lower extremity prepped and draped in the usual sterile fashion. A previous incision is used distally to remove the two distal interlocks. Previous proximal incisions are utilized and the proximal interlock found and then the screw removed. All three screws removed. The wound is copiously irrigated, subcu closed with interrupted 2-0 Vicryl, skin stapled, bulky dressing applied. The patient is awakened and transported to recovery room in stable condition. Dictated by:   Ollen Gross, M.D. Attending Physician:  Loanne Drilling DD:  03/03/02 TD:  03/04/02 Job: [id number]

## 2011-05-01 ENCOUNTER — Encounter: Payer: Medicare Other | Attending: Surgery | Admitting: *Deleted

## 2011-05-01 DIAGNOSIS — Z713 Dietary counseling and surveillance: Secondary | ICD-10-CM | POA: Insufficient documentation

## 2011-05-01 DIAGNOSIS — Z01818 Encounter for other preprocedural examination: Secondary | ICD-10-CM | POA: Insufficient documentation

## 2011-05-07 ENCOUNTER — Encounter: Payer: Self-pay | Admitting: Internal Medicine

## 2011-05-07 ENCOUNTER — Ambulatory Visit (INDEPENDENT_AMBULATORY_CARE_PROVIDER_SITE_OTHER): Payer: Medicare Other | Admitting: Internal Medicine

## 2011-05-07 DIAGNOSIS — E669 Obesity, unspecified: Secondary | ICD-10-CM

## 2011-05-07 DIAGNOSIS — E785 Hyperlipidemia, unspecified: Secondary | ICD-10-CM

## 2011-05-07 DIAGNOSIS — E119 Type 2 diabetes mellitus without complications: Secondary | ICD-10-CM

## 2011-05-07 DIAGNOSIS — I10 Essential (primary) hypertension: Secondary | ICD-10-CM

## 2011-05-07 MED ORDER — SIMVASTATIN 40 MG PO TABS: 20.0000 mg | ORAL_TABLET | Freq: Every day | ORAL | Status: DC

## 2011-05-07 NOTE — Assessment & Plan Note (Signed)
Has been off of all meds Will check labs at next visit

## 2011-05-07 NOTE — Assessment & Plan Note (Signed)
Continue meds May reduce dose after next lab draw

## 2011-05-07 NOTE — Assessment & Plan Note (Signed)
Note low bp Stop amlodipine

## 2011-05-07 NOTE — Progress Notes (Signed)
  Subjective:    Patient ID: [name], male    DOB: 1940/12/08, 70 y.o.   MRN: [id number]  HPI   patient comes in for followup of multiple medical problems including type 2 diabetes, hyperlipidemia, hypertension. The patient does not check blood sugar or blood pressure at home. The patetient does not follow an exercise  program. The patient denies any polyuria, polydipsia.  In the past the patient has gone to diabetic treatment center. The patient is tolerating medications  Without difficulty. The patient does admit to medication compliance.  Note significant weight loss.  Past Medical History  Diagnosis Date  . Diabetes mellitus   . Diverticulosis of colon   . GERD (gastroesophageal reflux disease)   . Hyperlipidemia   . Hypertension   . OSA (obstructive sleep apnea)   . Colon polyps   . Elevated PSA   . Diabetic retinopathy   . Hypothyroidism   . IBS (irritable bowel syndrome)   . Depression   . Macular degeneration     followed by ophthalmology   Past Surgical History  Procedure Date  . Orif tibia fracture   . Cholecystectomy   . Hemorrhoid surgery   . Laparoscopic gastric banding 03/05/11    weight loss    reports that he quit smoking about 30 years ago. He does not have any smokeless tobacco history on file. His alcohol and drug histories not on file. family history includes Alcohol abuse in his brother; Cancer (age of onset:72) in his mother; Heart attack in his paternal uncle; and Hypertension in his mother and paternal uncle. Allergies  Allergen Reactions  . Morphine Sulfate     REACTION: rash: itching     Review of Systems  patient denies chest pain, shortness of breath, orthopnea. Denies lower extremity edema, abdominal pain, change in appetite, change in bowel movements. Patient denies rashes, musculoskeletal complaints. No other specific complaints in a complete review of systems.      Objective:   Physical Exam  well-developed well-nourished male in no  acute distress. HEENT exam atraumatic, normocephalic, neck supple without jugular venous distention. Chest clear to auscultation cardiac exam S1-S2 are regular. Abdominal exam overweight with bowel sounds, soft and nontender. Extremities no edema. Neurologic exam is alert with a normal gait.      Assessment & Plan:

## 2011-05-07 NOTE — Assessment & Plan Note (Signed)
S/p lap  Band Has lost 50+ pounds

## 2011-05-27 ENCOUNTER — Encounter: Payer: Medicare Other | Attending: Surgery | Admitting: *Deleted

## 2011-05-27 DIAGNOSIS — Z713 Dietary counseling and surveillance: Secondary | ICD-10-CM | POA: Insufficient documentation

## 2011-05-27 DIAGNOSIS — Z01818 Encounter for other preprocedural examination: Secondary | ICD-10-CM | POA: Insufficient documentation

## 2011-06-26 ENCOUNTER — Encounter (INDEPENDENT_AMBULATORY_CARE_PROVIDER_SITE_OTHER): Payer: Self-pay | Admitting: General Surgery

## 2011-06-27 ENCOUNTER — Ambulatory Visit (INDEPENDENT_AMBULATORY_CARE_PROVIDER_SITE_OTHER): Payer: Medicare Other | Admitting: Surgery

## 2011-06-27 VITALS — BP 138/90 | Ht 72.0 in | Wt 219.4 lb

## 2011-06-27 DIAGNOSIS — Z9884 Bariatric surgery status: Secondary | ICD-10-CM

## 2011-06-27 NOTE — Progress Notes (Signed)
Mr. and Mrs. [name] came in today for a [place] felt. [name] however says that he is not having to take any of his medicines anymore and is happy where he is in terms of his wife. He is down 20 pounds and at 6 feet tall he is entering arrange where we could find acceptable weight loss from an his age. His weight size has gone from 42-38.  I went ahead and added 0.3 cc to his band. I will see him again in in 2 months he doesn't want to be too tight tori has to over chew his food since he has issues with his teeth.  Plan recheck in 2 months Data entered in the computer  Current Outpatient Prescriptions  Medication Sig Dispense Refill  . aspirin 81 MG tablet Take 81 mg by mouth daily.        Marland Kitchen glucose blood (FREESTYLE LITE) test strip Use as instructed  100 each  3  . levothyroxine (SYNTHROID, LEVOTHROID) 50 MCG tablet Take 50 mcg by mouth daily.        . traZODone (DESYREL) 50 MG tablet Take 50 mg by mouth at bedtime.        . citalopram (CELEXA) 20 MG tablet Take 20 mg by mouth daily.        . hydrocortisone 2.5 % lotion Apply topically daily as needed.        . simvastatin (ZOCOR) 40 MG tablet Take 0.5 tablets (20 mg total) by mouth at bedtime.

## 2011-07-08 ENCOUNTER — Other Ambulatory Visit (INDEPENDENT_AMBULATORY_CARE_PROVIDER_SITE_OTHER): Payer: Medicare Other

## 2011-07-08 DIAGNOSIS — E119 Type 2 diabetes mellitus without complications: Secondary | ICD-10-CM

## 2011-07-08 LAB — BASIC METABOLIC PANEL
BUN: 16 mg/dL (ref 6–23)
CO2: 27 mEq/L (ref 19–32)
Calcium: 8.7 mg/dL (ref 8.4–10.5)
Chloride: 99 mEq/L (ref 96–112)
Creatinine, Ser: 0.9 mg/dL (ref 0.4–1.5)
GFR: 90.37 mL/min (ref >60.00)
Glucose, Bld: 120 mg/dL — ABNORMAL HIGH (ref 70–99)
Potassium: 3.8 mEq/L (ref 3.5–5.1)
Sodium: 137 mEq/L (ref 135–145)

## 2011-07-08 LAB — HEPATIC FUNCTION PANEL
ALT: 35 U/L (ref 0–53)
AST: 25 U/L (ref 0–37)
Albumin: 4.2 g/dL (ref 3.5–5.2)
Alkaline Phosphatase: 44 U/L (ref 39–117)
Bilirubin, Direct: 0.1 mg/dL (ref 0.0–0.3)
Total Bilirubin: 0.9 mg/dL (ref 0.3–1.2)
Total Protein: 6.6 g/dL (ref 6.0–8.3)

## 2011-07-08 LAB — LIPID PANEL
Cholesterol: 139 mg/dL (ref 0–200)
HDL: 49 mg/dL (ref >39.00)
LDL Cholesterol: 63 mg/dL (ref 0–99)
Total CHOL/HDL Ratio: 3
Triglycerides: 134 mg/dL (ref 0.0–149.0)
VLDL: 26.8 mg/dL (ref 0.0–40.0)

## 2011-07-08 LAB — HEMOGLOBIN A1C: Hgb A1c MFr Bld: 5.9 % (ref 4.6–6.5)

## 2011-07-19 ENCOUNTER — Encounter: Payer: Self-pay | Admitting: Internal Medicine

## 2011-07-19 ENCOUNTER — Ambulatory Visit (INDEPENDENT_AMBULATORY_CARE_PROVIDER_SITE_OTHER): Payer: Medicare Other | Admitting: Internal Medicine

## 2011-07-19 DIAGNOSIS — I10 Essential (primary) hypertension: Secondary | ICD-10-CM

## 2011-07-19 DIAGNOSIS — E785 Hyperlipidemia, unspecified: Secondary | ICD-10-CM

## 2011-07-19 DIAGNOSIS — E669 Obesity, unspecified: Secondary | ICD-10-CM

## 2011-07-19 DIAGNOSIS — E119 Type 2 diabetes mellitus without complications: Secondary | ICD-10-CM

## 2011-07-19 NOTE — Assessment & Plan Note (Signed)
Controlled He is not requiring meds (due to weight loss)

## 2011-07-19 NOTE — Assessment & Plan Note (Signed)
DM is essentially resolved after surgery (lap band).  No meds required

## 2011-07-19 NOTE — Assessment & Plan Note (Signed)
Much improved. Encouraged continued weight loss

## 2011-07-19 NOTE — Progress Notes (Signed)
  Subjective:    Patient ID: [name], male    DOB: Apr 21, 1941, 70 y.o.   MRN: [id number]  HPI   patient comes in for followup of multiple medical problems including type 2 diabetes, hyperlipidemia, hypertension. The patient does not check blood sugar or blood pressure at home. The patetient does not follow an exercise or diet program. The patient denies any polyuria, polydipsia.  In the past the patient has gone to diabetic treatment center. The patient is tolerating medications  Without difficulty. No DM meds except he takes a shot of insulin when he eats corn flakes.  Past Medical History  Diagnosis Date  . Diabetes mellitus   . Diverticulosis of colon   . GERD (gastroesophageal reflux disease)   . Hyperlipidemia   . Hypertension   . OSA (obstructive sleep apnea)   . Colon polyps   . Elevated PSA   . Diabetic retinopathy   . Hypothyroidism   . IBS (irritable bowel syndrome)   . Depression   . Macular degeneration     followed by ophthalmology   Past Surgical History  Procedure Date  . Orif tibia fracture   . Cholecystectomy   . Hemorrhoid surgery   . Laparoscopic gastric banding 03/05/11    weight loss    reports that he quit smoking about 30 years ago. He does not have any smokeless tobacco history on file. His alcohol and drug histories not on file. family history includes Alcohol abuse in his brother; Cancer (age of onset:72) in his mother; Heart attack in his paternal uncle; and Hypertension in his mother and paternal uncle. Allergies  Allergen Reactions  . Morphine Sulfate     REACTION: rash: itching      Review of Systems  patient denies chest pain, shortness of breath, orthopnea. Denies lower extremity edema, abdominal pain, change in appetite, change in bowel movements. Patient denies rashes, musculoskeletal complaints. No other specific complaints in a complete review of systems.      Objective:   Physical Exam  well-developed well-nourished male in no  acute distress. HEENT exam atraumatic, normocephalic, neck supple without jugular venous distention. Chest clear to auscultation cardiac exam S1-S2 are regular. Abdominal exam overweight with bowel sounds, soft and nontender. Extremities no edema. Neurologic exam is alert with a normal gait.     Assessment & Plan:

## 2011-07-19 NOTE — Assessment & Plan Note (Signed)
Controlled Continue meds 

## 2011-07-29 ENCOUNTER — Ambulatory Visit (INDEPENDENT_AMBULATORY_CARE_PROVIDER_SITE_OTHER): Payer: Medicare Other | Admitting: Internal Medicine

## 2011-07-29 DIAGNOSIS — Z23 Encounter for immunization: Secondary | ICD-10-CM

## 2011-08-12 ENCOUNTER — Encounter (INDEPENDENT_AMBULATORY_CARE_PROVIDER_SITE_OTHER): Payer: Medicare Other | Admitting: Ophthalmology

## 2011-08-12 DIAGNOSIS — H353290 Exudative age-related macular degeneration, unspecified eye, stage unspecified: Secondary | ICD-10-CM

## 2011-08-12 DIAGNOSIS — H43819 Vitreous degeneration, unspecified eye: Secondary | ICD-10-CM

## 2011-08-12 DIAGNOSIS — H3530 Unspecified macular degeneration: Secondary | ICD-10-CM

## 2011-08-14 ENCOUNTER — Encounter (INDEPENDENT_AMBULATORY_CARE_PROVIDER_SITE_OTHER): Payer: Self-pay | Admitting: Surgery

## 2011-08-14 ENCOUNTER — Ambulatory Visit (INDEPENDENT_AMBULATORY_CARE_PROVIDER_SITE_OTHER): Payer: Medicare Other | Admitting: Surgery

## 2011-08-14 VITALS — Ht 72.0 in | Wt 227.2 lb

## 2011-08-14 DIAGNOSIS — Z9884 Bariatric surgery status: Secondary | ICD-10-CM

## 2011-08-14 DIAGNOSIS — Z4651 Encounter for fitting and adjustment of gastric lap band: Secondary | ICD-10-CM

## 2011-08-14 NOTE — Progress Notes (Signed)
[name] and his wife came in today in followup. His last weight was 219 and he has gained up to 227. I added a half cc to his band. He actually looks good and since our repair of his hiatal hernia he has had no further reflux and he is off his omeprazole. I resolved without medical issues in his problem list.  We will try to see him back in 3 months. He and his wife are going several medications and plan a trip to [place] next year. Plan return in 3 months

## 2011-08-14 NOTE — Patient Instructions (Signed)

## 2011-08-20 ENCOUNTER — Encounter (INDEPENDENT_AMBULATORY_CARE_PROVIDER_SITE_OTHER): Payer: Medicare Other | Admitting: Ophthalmology

## 2011-08-20 DIAGNOSIS — H43819 Vitreous degeneration, unspecified eye: Secondary | ICD-10-CM

## 2011-08-20 DIAGNOSIS — H353290 Exudative age-related macular degeneration, unspecified eye, stage unspecified: Secondary | ICD-10-CM

## 2011-08-20 DIAGNOSIS — H3530 Unspecified macular degeneration: Secondary | ICD-10-CM

## 2011-08-23 LAB — URINALYSIS, ROUTINE W REFLEX MICROSCOPIC
Bilirubin Urine: NEGATIVE
Glucose, UA: NEGATIVE
Ketones, ur: NEGATIVE
Leukocytes, UA: NEGATIVE
Nitrite: NEGATIVE
Protein, ur: NEGATIVE
Specific Gravity, Urine: 1.01
Urobilinogen, UA: 1
pH: 5.5

## 2011-08-23 LAB — BASIC METABOLIC PANEL
BUN: 10
CO2: 25
Calcium: 8.8
Chloride: 103
Creatinine, Ser: 0.81
GFR calc Af Amer: >60
GFR calc non Af Amer: >60
Glucose, Bld: 184 — ABNORMAL HIGH
Potassium: 3.7
Sodium: 136

## 2011-08-23 LAB — URINE MICROSCOPIC-ADD ON

## 2011-08-23 LAB — HEMOGLOBIN AND HEMATOCRIT, BLOOD
HCT: 42.9
Hemoglobin: 14.9

## 2011-08-26 ENCOUNTER — Ambulatory Visit (INDEPENDENT_AMBULATORY_CARE_PROVIDER_SITE_OTHER): Payer: Medicare Other | Admitting: Internal Medicine

## 2011-08-26 DIAGNOSIS — Z23 Encounter for immunization: Secondary | ICD-10-CM

## 2011-08-26 LAB — BASIC METABOLIC PANEL
BUN: 8
CO2: 28
Calcium: 8.4
Chloride: 105
Creatinine, Ser: 0.96
GFR calc Af Amer: >60
GFR calc non Af Amer: >60
Glucose, Bld: 136 — ABNORMAL HIGH
Potassium: 3.8
Sodium: 139

## 2011-08-26 LAB — CBC
HCT: 43.4
Hemoglobin: 14.9
MCHC: 34.3
MCV: 81.6
Platelets: 233
RBC: 5.31
RDW: 13.8
WBC: 15.5 — ABNORMAL HIGH

## 2011-08-27 ENCOUNTER — Encounter: Payer: Self-pay | Admitting: *Deleted

## 2011-08-27 ENCOUNTER — Encounter: Payer: Medicare Other | Attending: Surgery | Admitting: *Deleted

## 2011-08-27 DIAGNOSIS — Z713 Dietary counseling and surveillance: Secondary | ICD-10-CM | POA: Insufficient documentation

## 2011-08-27 DIAGNOSIS — Z9884 Bariatric surgery status: Secondary | ICD-10-CM | POA: Insufficient documentation

## 2011-08-27 DIAGNOSIS — Z09 Encounter for follow-up examination after completed treatment for conditions other than malignant neoplasm: Secondary | ICD-10-CM | POA: Insufficient documentation

## 2011-08-27 NOTE — Progress Notes (Signed)
  Follow-up visit: 6 Months Post-Operative LAGB Surgery  Medical Nutrition Therapy:  Appt start time: 0900 end time:  0930.  Assessment:  Primary concerns today: post-operative bariatric surgery nutrition management. [name] has experienced a 5-10 lb weight gain in the past several months likely due to increased carbohydrate intake. He does well a controlling glucose levels with his diet and rarely has to use his insulin yet has "gotten away from" his good diet over the summer. He recently received a band fill which has helped him to feel more full. Pt and his wife inquire about dietary modifications for 2 upcoming cruises and one mission trip to [place] in the next 9 months.  Weight today: 225.8 lbs Weight change: 4.7 lbs gain Total weight lost: 35.9 lbs lost BMI: 30.7 % Weight goal: 180-185 lbs  24-hr recall:  B (8 AM): 1 egg, 1/2 cup oatmeal OR 1/2 sandwich round w/ butter  Snk (AM): Protein shake   L (12-1 PM): Chicken wings (3) OR BBQ meat OR Cheese w/ 8 crackers Snk (3 PM): Protein shake  D (6-7 PM): Soup OR Hamburger steak w/ green beans Snk (9 PM): Peanut butter, 8 Ritz crackers  Fluid intake: Crystal Light, Water, Protein Supplement = 60 Estimated total protein intake: 60-80  Medications: See update medication list. Supplementation: Taking supplements 100% per ASMBS guidelines  CBG monitoring: Multiple times throughout the day Average CBG per patient: 90-140 mg/dl  Lab Results  Component Value Date   HGBA1C 5.9 07/08/2011   Using straws: No Drinking while eating: Yes, up to 6-8 oz with meals Hair loss: No Carbonated beverages: No N/V/D/C: No Last Lap-Band fill: 1/2 cc band fill per Dr. Daphine Deutscher on 08/14/11  Recent physical activity:  Walking 45 minutes/day, everyday.  Progress Towards Goal(s):  In progress.  Nutritional Diagnosis:  Hodges-3.3 Overweight/obesity As related to recent LAGB procedure.  As evidenced by pt following LAGB dietary guidelines for continued weight  loss.    Intervention:  Nutrition education.  Monitoring/Evaluation:  Dietary intake, exercise, lap band fills, and body weight. Follow up in 3 months for 9 month post-op visit.

## 2011-08-27 NOTE — Patient Instructions (Signed)
Goals:  Follow Phase 3B: High Protein + Non-Starchy Vegetables  Eat 3-6 small meals/snacks, every 3-5 hrs  Increase lean protein foods to meet 80-100g goal  Increase fluid intake to 64oz +  Add 15 grams of carbohydrate (fruit, whole grain, starchy vegetable) with meals  Avoid drinking 15 minutes before, during and 30 minutes after eating  Aim for >30 min of physical activity daily 

## 2011-08-29 ENCOUNTER — Ambulatory Visit: Payer: Medicare Other | Admitting: *Deleted

## 2011-08-29 LAB — HEMOGLOBIN AND HEMATOCRIT, BLOOD
HCT: 44.4
Hemoglobin: 15.3

## 2011-08-29 LAB — BASIC METABOLIC PANEL
BUN: 12
CO2: 28
Calcium: 9.3
Chloride: 102
Creatinine, Ser: 1.01
GFR calc Af Amer: >60
GFR calc non Af Amer: >60
Glucose, Bld: 211 — ABNORMAL HIGH
Potassium: 3.7
Sodium: 139

## 2011-09-02 LAB — GLUCOSE, CAPILLARY
Glucose-Capillary: 103 — ABNORMAL HIGH
Glucose-Capillary: 126 — ABNORMAL HIGH

## 2011-11-07 ENCOUNTER — Other Ambulatory Visit (INDEPENDENT_AMBULATORY_CARE_PROVIDER_SITE_OTHER): Payer: Medicare Other

## 2011-11-07 DIAGNOSIS — E119 Type 2 diabetes mellitus without complications: Secondary | ICD-10-CM

## 2011-11-07 LAB — BASIC METABOLIC PANEL
BUN: 15 mg/dL (ref 6–23)
CO2: 27 mEq/L (ref 19–32)
Calcium: 9.2 mg/dL (ref 8.4–10.5)
Chloride: 102 mEq/L (ref 96–112)
Creatinine, Ser: 1.1 mg/dL (ref 0.4–1.5)
GFR: 73 mL/min (ref >60.00)
Glucose, Bld: 109 mg/dL — ABNORMAL HIGH (ref 70–99)
Potassium: 4 mEq/L (ref 3.5–5.1)
Sodium: 139 mEq/L (ref 135–145)

## 2011-11-07 LAB — HEPATIC FUNCTION PANEL
ALT: 20 U/L (ref 0–53)
AST: 17 U/L (ref 0–37)
Albumin: 4.4 g/dL (ref 3.5–5.2)
Alkaline Phosphatase: 44 U/L (ref 39–117)
Bilirubin, Direct: 0.1 mg/dL (ref 0.0–0.3)
Total Bilirubin: 1.1 mg/dL (ref 0.3–1.2)
Total Protein: 6.9 g/dL (ref 6.0–8.3)

## 2011-11-07 LAB — LIPID PANEL
Cholesterol: 136 mg/dL (ref 0–200)
HDL: 43.6 mg/dL (ref >39.00)
LDL Cholesterol: 63 mg/dL (ref 0–99)
Total CHOL/HDL Ratio: 3
Triglycerides: 145 mg/dL (ref 0.0–149.0)
VLDL: 29 mg/dL (ref 0.0–40.0)

## 2011-11-07 LAB — HEMOGLOBIN A1C: Hgb A1c MFr Bld: 6.1 % (ref 4.6–6.5)

## 2011-11-18 ENCOUNTER — Encounter (INDEPENDENT_AMBULATORY_CARE_PROVIDER_SITE_OTHER): Payer: Medicare Other | Admitting: Ophthalmology

## 2011-11-18 DIAGNOSIS — H2510 Age-related nuclear cataract, unspecified eye: Secondary | ICD-10-CM

## 2011-11-18 DIAGNOSIS — H43819 Vitreous degeneration, unspecified eye: Secondary | ICD-10-CM

## 2011-11-18 DIAGNOSIS — H353290 Exudative age-related macular degeneration, unspecified eye, stage unspecified: Secondary | ICD-10-CM

## 2011-11-18 DIAGNOSIS — H3530 Unspecified macular degeneration: Secondary | ICD-10-CM

## 2011-11-19 ENCOUNTER — Ambulatory Visit: Payer: Medicare Other | Admitting: Internal Medicine

## 2011-12-07 ENCOUNTER — Ambulatory Visit (INDEPENDENT_AMBULATORY_CARE_PROVIDER_SITE_OTHER): Payer: Medicare Other | Admitting: Internal Medicine

## 2011-12-07 DIAGNOSIS — J029 Acute pharyngitis, unspecified: Secondary | ICD-10-CM

## 2011-12-07 DIAGNOSIS — R509 Fever, unspecified: Secondary | ICD-10-CM

## 2011-12-07 LAB — POCT RAPID STREP A (OFFICE): Rapid Strep A Screen: NEGATIVE

## 2011-12-07 MED ORDER — AMOXICILLIN 875 MG PO TABS: 875.0000 mg | ORAL_TABLET | Freq: Two times a day (BID) | ORAL | Status: DC

## 2011-12-07 NOTE — Patient Instructions (Signed)
Pharyngitis - most likely  Viral, but the rigors are of concern, therefore Amoxicillin 875 mg twice a day 7 days; gargle of choice; tylenol for fever.  TMJ - ok to take 2 aleve twice a day. Watch for GI distress.  Pharyngitis, Viral and Bacterial Pharyngitis is soreness (inflammation) or infection of the pharynx. It is also called a sore throat. CAUSES   Most sore throats are caused by viruses and are part of a cold. However, some sore throats are caused by strep and other bacteria. Sore throats can also be caused by post nasal drip from draining sinuses, allergies and sometimes from sleeping with an open mouth. Infectious sore throats can be spread from person to person by coughing, sneezing and sharing cups or eating utensils. TREATMENT   Sore throats that are viral usually last 3-4 days. Viral illness will get better without medications (antibiotics). Strep throat and other bacterial infections will usually begin to get better about 24-48 hours after you begin to take antibiotics. HOME CARE INSTRUCTIONS    If the caregiver feels there is a bacterial infection or if there is a positive strep test, they will prescribe an antibiotic. The full course of antibiotics must be taken. If the full course of antibiotic is not taken, you or your child may become ill again. If you or your child has strep throat and do not finish all of the medication, serious heart or kidney diseases may develop.     Drink enough water and fluids to keep your urine clear or pale yellow.     Only take over-the-counter or prescription medicines for pain, discomfort or fever as directed by your caregiver.     Get lots of rest.     Gargle with salt water ( tsp. of salt in a glass of water) as often as every 1-2 hours as you need for comfort.     Hard candies may soothe the throat if individual is not at risk for choking. Throat sprays or lozenges may also be used.  SEEK MEDICAL CARE IF:    Large, tender lumps in the neck  develop.     A rash develops.     Green, yellow-brown or bloody sputum is coughed up.     Your baby is older than 3 months with a rectal temperature of 100.5 F (38.1 C) or higher for more than 1 day.  SEEK IMMEDIATE MEDICAL CARE IF:    A stiff neck develops.     You or your child are drooling or unable to swallow liquids.     You or your child are vomiting, unable to keep medications or liquids down.     You or your child has severe pain, unrelieved with recommended medications.     You or your child are having difficulty breathing (not due to stuffy nose).     You or your child are unable to fully open your mouth.     You or your child develop redness, swelling, or severe pain anywhere on the neck.     You have a fever.     Your baby is older than 3 months with a rectal temperature of 102 F (38.9 C) or higher.     Your baby is 73 months old or younger with a rectal temperature of 100.4 F (38 C) or higher.  MAKE SURE YOU:    Understand these instructions.     Will watch your condition.     Will get help right away if  you are not doing well or get worse.  Document Released: 11/18/2005 Document Revised: 07/31/2011 Document Reviewed: 02/15/2008 [facility] Patient Information 2012 [place], [place].   Temporomandibular Joint Pain Your exam shows that you have a problem with your temporomandibular joint (TMJ), the joint that moves when you open your mouth or chew food. TMJ problems can result from direct injuries, bite abnormalities, or tension states which cause you to grind or clench your teeth. Typical symptoms include pain around the joint, clicking, restricted movement, and headaches. The TMJ is like any other joint in the body; when it is strained, it needs rest to repair itself. To keep the joint at rest it is important that you do not open your mouth wider than the width of your index finger. If you must yawn, be sure to support your chin with your hand so your mouth does  not open wide. Eat a soft diet (nothing firmer than ground beef, no raw vegetables), do not chew gum and do not talk if it causes you pain. Apply topical heat by using a warm, moist cloth placed in front of the ear for 15 to 20 minutes several times daily. Alternating heat and ice may give even more relief. Anti-inflammatory pain medicine and muscle relaxants can also be helpful. A dental orthotic or splint may be used for temporary relief. Long-term problems may require treatment for stress as well as braces or surgery. Please check with your doctor or dentist if your symptoms do not improve within one week. Document Released: 12/26/2004 Document Revised: 07/31/2011 Document Reviewed: 11/18/2005 [facility] Patient Information 2012 [place], [place].

## 2011-12-07 NOTE — Progress Notes (Signed)
  Subjective:    Patient ID: [name], male    DOB: May 30, 1941, 71 y.o.   MRN: [id number]  HPI Mr. Fandrich presents with a 48 hr of sore throat - really painful, odynophagia. He has had shaking chills but has not checked temperature. No cough, no swelling,no lymph nodes. No SOB/DOE. No N/V/D. Blood sugars have been OK.  I have reviewed the patient's medical history in detail and updated the computerized patient record.      Review of Systems System review is negative for any constitutional, cardiac, pulmonary, GI or neuro symptoms or complaints other than as described in the HPI.      Objective:   Physical Exam Filed Vitals:   12/07/11 1050  BP: 150/80  Pulse: 76  Temp: 98.7 F (37.1 C)   Gen'l- overweight white man in no acute distress HEENT- TMs normal, throat w/o exudate, no overt edema Neck- supple Nodes - negative submandibular and cervical nodes Pulm - normal respirations, CTAP Cor- RRR       Assessment & Plan:  Pharyngitis - most likely viral but with rigors will cover with amoxicillin  Plan - amox 875 mg bid x 7           Comfort care.

## 2011-12-11 ENCOUNTER — Ambulatory Visit: Payer: Medicare Other | Admitting: Internal Medicine

## 2011-12-16 ENCOUNTER — Encounter (INDEPENDENT_AMBULATORY_CARE_PROVIDER_SITE_OTHER): Payer: Medicare Other | Admitting: Ophthalmology

## 2011-12-16 DIAGNOSIS — H2510 Age-related nuclear cataract, unspecified eye: Secondary | ICD-10-CM

## 2011-12-16 DIAGNOSIS — H353290 Exudative age-related macular degeneration, unspecified eye, stage unspecified: Secondary | ICD-10-CM | POA: Diagnosis not present

## 2011-12-16 DIAGNOSIS — H3530 Unspecified macular degeneration: Secondary | ICD-10-CM

## 2011-12-16 DIAGNOSIS — H43819 Vitreous degeneration, unspecified eye: Secondary | ICD-10-CM | POA: Diagnosis not present

## 2011-12-31 ENCOUNTER — Ambulatory Visit (INDEPENDENT_AMBULATORY_CARE_PROVIDER_SITE_OTHER): Payer: Medicare Other | Admitting: Internal Medicine

## 2011-12-31 VITALS — BP 126/72 | HR 76 | Temp 98.3°F | Wt 230.0 lb

## 2011-12-31 DIAGNOSIS — I10 Essential (primary) hypertension: Secondary | ICD-10-CM

## 2011-12-31 DIAGNOSIS — E669 Obesity, unspecified: Secondary | ICD-10-CM | POA: Diagnosis not present

## 2011-12-31 DIAGNOSIS — E119 Type 2 diabetes mellitus without complications: Secondary | ICD-10-CM

## 2011-12-31 NOTE — Assessment & Plan Note (Signed)
HE HAS REGAINED SOME WEIGHT. Note 10 pound weight gain. I have asked him to go back and see Dr. Daphine Deutscher

## 2011-12-31 NOTE — Assessment & Plan Note (Signed)
BP Readings from Last 3 Encounters:  12/31/11 126/72  12/07/11 150/80  07/19/11 130/74  Reasonable control Continue same meds

## 2011-12-31 NOTE — Assessment & Plan Note (Signed)
Lab Results  Component Value Date   HGBA1C 6.1 11/07/2011   Controlled Continues same meds Encouraged additional weight loss of at least 15 pounds

## 2011-12-31 NOTE — Progress Notes (Signed)
Patient ID: [name], male   DOB: 01-02-1941, 71 y.o.   MRN: [id number]  patient comes in for followup of multiple medical problems including type 2 diabetes, hyperlipidemia, hypertension. The patient does not check blood sugar or blood pressure at home. The patetient does not follow an exercise or diet program. The patient denies any polyuria, polydipsia.  In the past the patient has gone to diabetic treatment center. The patient is tolerating medications  Without difficulty. The patient does admit to medication compliance.  Note he rarely uses insulin---only if CBG greater than 140 He is not on any bp meds Lipids---tolerating meds  Past Medical History  Diagnosis Date  . Diabetes mellitus   . Diverticulosis of colon   . GERD (gastroesophageal reflux disease)   . Hyperlipidemia   . Hypertension   . OSA (obstructive sleep apnea)   . Colon polyps   . Elevated PSA   . Diabetic retinopathy   . Hypothyroidism   . IBS (irritable bowel syndrome)   . Depression   . Macular degeneration     followed by ophthalmology    History   Social History  . Marital Status: Married    Spouse Name: N/A    Number of Children: N/A  . Years of Education: N/A   Occupational History  . Not on file.   Social History Main Topics  . Smoking status: Former Smoker    Quit date: 02/25/1981  . Smokeless tobacco: Not on file  . Alcohol Use: No  . Drug Use: No  . Sexually Active: Not on file   Other Topics Concern  . Not on file   Social History Narrative  . No narrative on file    Past Surgical History  Procedure Date  . Orif tibia fracture   . Cholecystectomy   . Hemorrhoid surgery   . Laparoscopic gastric banding 03/05/11    weight loss    Family History  Problem Relation Age of Onset  . Cancer Mother 30    stomach  . Hypertension Mother   . Alcohol abuse Brother   . Hypertension Paternal Uncle   . Heart attack Paternal Uncle     Allergies  Allergen Reactions  . Morphine  Sulfate     REACTION: rash: itching    Current Outpatient Prescriptions on File Prior to Visit  Medication Sig Dispense Refill  . AMLODIPINE BESYLATE PO Take 10 mg by mouth daily.       Marland Kitchen glucose blood (FREESTYLE LITE) test strip Use as instructed  100 each  3  . insulin aspart (NOVOLOG) 100 UNIT/ML injection Inject 10 Units into the skin. When sugar is elevated       . levothyroxine (SYNTHROID, LEVOTHROID) 50 MCG tablet Take 50 mcg by mouth daily.        . simvastatin (ZOCOR) 40 MG tablet Take 0.5 tablets (20 mg total) by mouth at bedtime.      . traZODone (DESYREL) 50 MG tablet Take 50 mg by mouth at bedtime.           patient denies chest pain, shortness of breath, orthopnea. Denies lower extremity edema, abdominal pain, change in appetite, change in bowel movements. Patient denies rashes, musculoskeletal complaints. No other specific complaints in a complete review of systems.   BP 126/72  Pulse 76  Temp(Src) 98.3 F (36.8 C) (Oral)  Wt 230 lb (104.327 kg)  well-developed well-nourished male in no acute distress. HEENT exam atraumatic, normocephalic, neck supple without jugular venous distention.  Chest clear to auscultation cardiac exam S1-S2 are regular. Abdominal exam overweight with bowel sounds, soft and nontender. Extremities no edema. Neurologic exam is alert with a normal gait.

## 2012-01-01 IMAGING — CR DG UGI W/ GASTROGRAFIN
1 series · 1 of 1 positions shown · IV contrast (agent unspecified)
Comparison: 05/17/2010.

CLINICAL DATA: Postop gastric banding.

WATER SOLUBLE UPPER GI SERIES
TECHNIQUE: Single-column upper GI series was performed using water
soluble contrast.
Fluoroscopy Time: 0.5 minutes
Contrast: 20 ml Bmnipaque-TAA

[view not recorded]
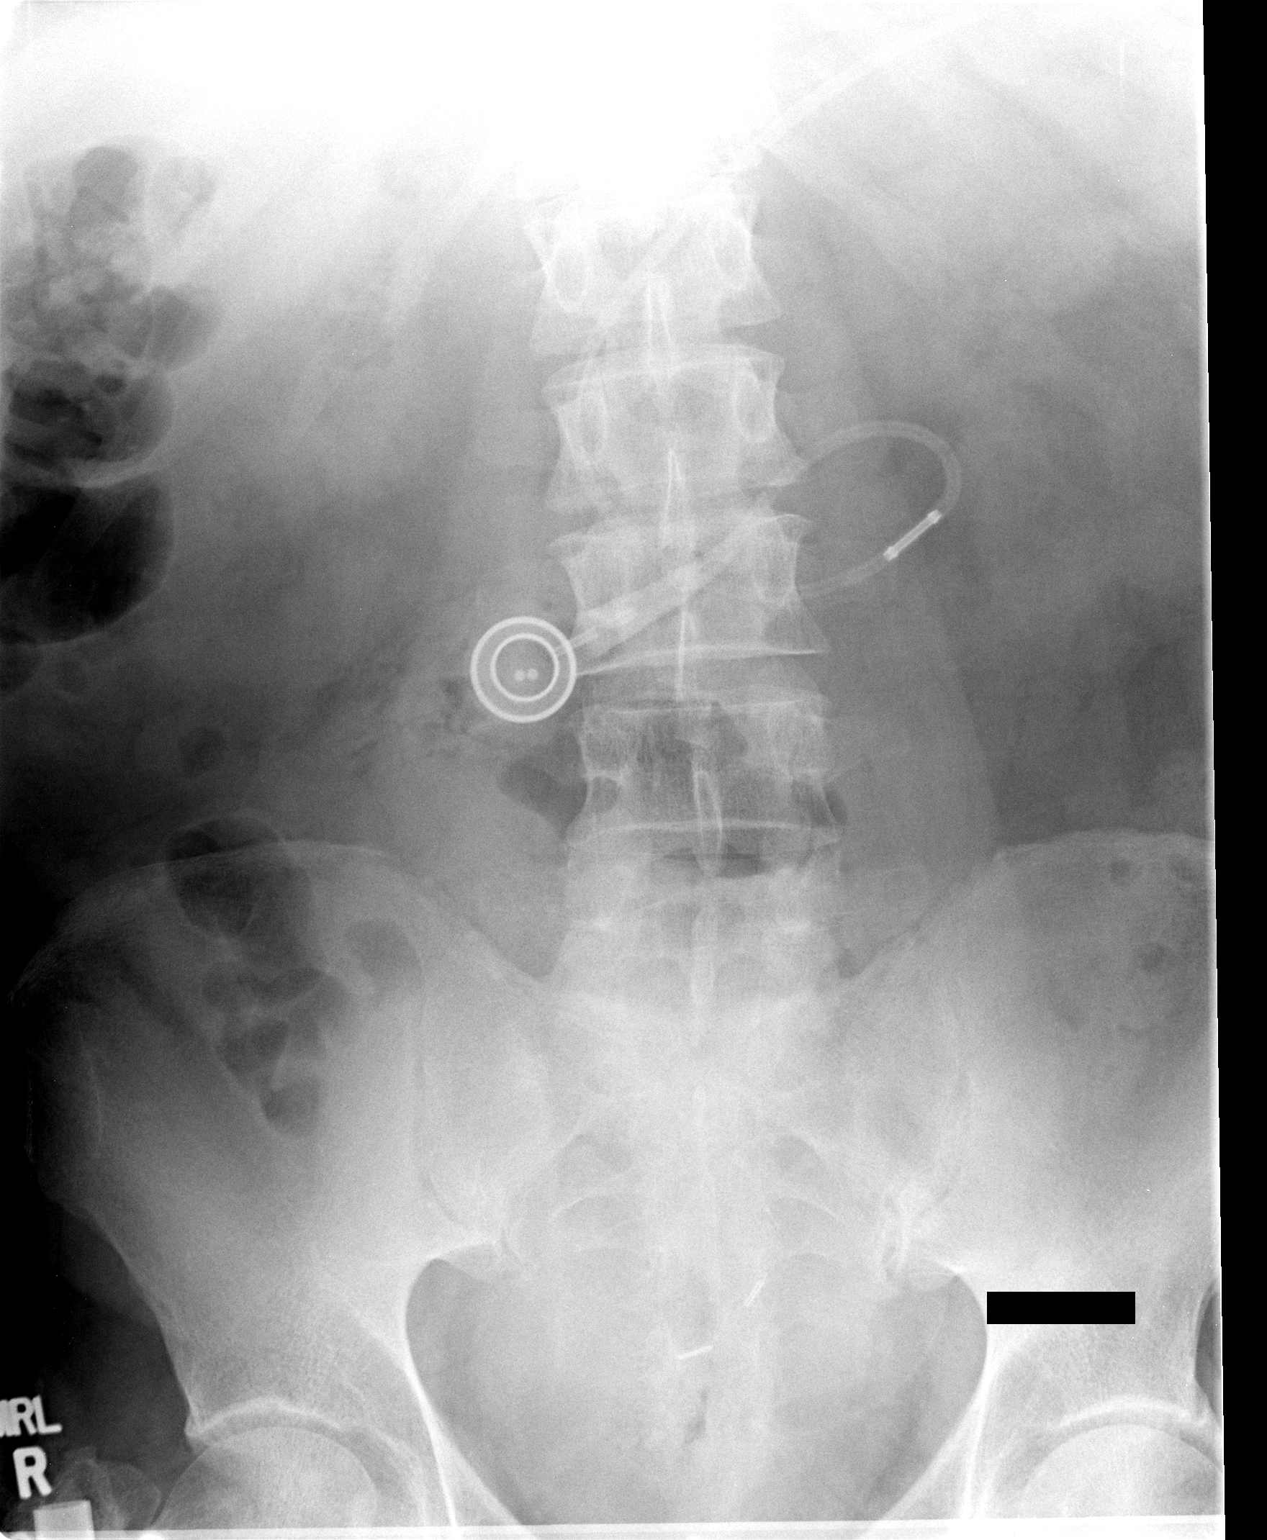

[1 of 1 positions shown; findings below may reference images not displayed]

FINDINGS: Scout view of the abdomen shows a normal bowel gas
pattern.  The patient drank 20 ml of Bmnipaque-TAA.  Contrast
passed readily through the gastric band into the stomach.  No leak.
IMPRESSION: Contrast passed readily through the gastric band into the stomach.
No leak.

## 2012-01-27 ENCOUNTER — Ambulatory Visit (INDEPENDENT_AMBULATORY_CARE_PROVIDER_SITE_OTHER): Payer: Medicare Other | Admitting: Internal Medicine

## 2012-01-27 ENCOUNTER — Ambulatory Visit: Payer: Medicare Other | Admitting: Internal Medicine

## 2012-01-27 DIAGNOSIS — Z23 Encounter for immunization: Secondary | ICD-10-CM | POA: Diagnosis not present

## 2012-02-03 ENCOUNTER — Encounter: Payer: Self-pay | Admitting: Internal Medicine

## 2012-02-10 ENCOUNTER — Encounter (INDEPENDENT_AMBULATORY_CARE_PROVIDER_SITE_OTHER): Payer: Medicare Other | Admitting: Ophthalmology

## 2012-02-10 DIAGNOSIS — H2510 Age-related nuclear cataract, unspecified eye: Secondary | ICD-10-CM

## 2012-02-10 DIAGNOSIS — H3530 Unspecified macular degeneration: Secondary | ICD-10-CM | POA: Diagnosis not present

## 2012-02-10 DIAGNOSIS — H43819 Vitreous degeneration, unspecified eye: Secondary | ICD-10-CM | POA: Diagnosis not present

## 2012-02-10 DIAGNOSIS — H353290 Exudative age-related macular degeneration, unspecified eye, stage unspecified: Secondary | ICD-10-CM | POA: Diagnosis not present

## 2012-03-11 ENCOUNTER — Ambulatory Visit (AMBULATORY_SURGERY_CENTER): Payer: Medicare Other | Admitting: *Deleted

## 2012-03-11 VITALS — Ht 72.0 in | Wt 226.4 lb

## 2012-03-11 DIAGNOSIS — Z1211 Encounter for screening for malignant neoplasm of colon: Secondary | ICD-10-CM

## 2012-03-11 MED ORDER — METOCLOPRAMIDE HCL 10 MG PO TABS: ORAL_TABLET | ORAL | Status: DC

## 2012-03-11 NOTE — Progress Notes (Signed)
Pt had Miralax and Duccolax from [place] from previously scheduled procedure.  Instructions given for Miralax prep.  Ezra Sites

## 2012-03-24 ENCOUNTER — Ambulatory Visit: Payer: Medicare Other | Admitting: *Deleted

## 2012-03-25 ENCOUNTER — Ambulatory Visit (AMBULATORY_SURGERY_CENTER): Payer: Medicare Other | Admitting: Internal Medicine

## 2012-03-25 ENCOUNTER — Encounter: Payer: Self-pay | Admitting: Internal Medicine

## 2012-03-25 VITALS — BP 157/86 | HR 68 | Temp 97.0°F | Resp 16 | Ht 72.0 in | Wt 226.0 lb

## 2012-03-25 DIAGNOSIS — K5730 Diverticulosis of large intestine without perforation or abscess without bleeding: Secondary | ICD-10-CM

## 2012-03-25 DIAGNOSIS — Z86010 Personal history of colonic polyps: Secondary | ICD-10-CM

## 2012-03-25 DIAGNOSIS — Z1211 Encounter for screening for malignant neoplasm of colon: Secondary | ICD-10-CM

## 2012-03-25 LAB — GLUCOSE, CAPILLARY
Glucose-Capillary: 127 mg/dL — ABNORMAL HIGH (ref 70–99)
Glucose-Capillary: 120 mg/dL — ABNORMAL HIGH (ref 70–99)

## 2012-03-25 MED ORDER — SODIUM CHLORIDE 0.9 % IV SOLN: 500.0000 mL | INTRAVENOUS | Status: DC

## 2012-03-25 NOTE — Patient Instructions (Signed)
YOU HAD AN ENDOSCOPIC PROCEDURE TODAY AT THE [facility]: Refer to the procedure report that was given to you for any specific questions about what was found during the examination.  If the procedure report does not answer your questions, please call your gastroenterologist to clarify.  If you requested that your care partner not be given the details of your procedure findings, then the procedure report has been included in a sealed envelope for you to review at your convenience later.  YOU SHOULD EXPECT: Some feelings of bloating in the abdomen. Passage of more gas than usual.  Walking can help get rid of the air that was put into your GI tract during the procedure and reduce the bloating. If you had a lower endoscopy (such as a colonoscopy or flexible sigmoidoscopy) you may notice spotting of blood in your stool or on the toilet paper. If you underwent a bowel prep for your procedure, then you may not have a normal bowel movement for a few days.  DIET: Your first meal following the procedure should be a light meal and then it is ok to progress to your normal diet.  A half-sandwich or bowl of soup is an example of a good first meal.  Heavy or fried foods are harder to digest and may make you feel nauseous or bloated.  Likewise meals heavy in dairy and vegetables can cause extra gas to form and this can also increase the bloating.  Drink plenty of fluids but you should avoid alcoholic beverages for 24 hours.  ACTIVITY: Your care partner should take you home directly after the procedure.  You should plan to take it easy, moving slowly for the rest of the day.  You can resume normal activity the day after the procedure however you should NOT DRIVE or use heavy machinery for 24 hours (because of the sedation medicines used during the test).    SYMPTOMS TO REPORT IMMEDIATELY: A gastroenterologist can be reached at any hour.  During normal business hours, 8:30 AM to 5:00 PM Monday through Friday,  call [phone].  After hours and on weekends, please call the GI answering service at [phone] who will take a message and have the physician on call contact you.   Following lower endoscopy (colonoscopy or flexible sigmoidoscopy):  Excessive amounts of blood in the stool  Significant tenderness or worsening of abdominal pains  Swelling of the abdomen that is new, acute  Fever of 100F or higher    FOLLOW UP: If any biopsies were taken you will be contacted by phone or by letter within the next 1-3 weeks.  Call your gastroenterologist if you have not heard about the biopsies in 3 weeks.  Our staff will call the home number listed on your records the next business day following your procedure to check on you and address any questions or concerns that you may have at that time regarding the information given to you following your procedure. This is a courtesy call and so if there is no answer at the home number and we have not heard from you through the emergency physician on call, we will assume that you have returned to your regular daily activities without incident.  SIGNATURES/CONFIDENTIALITY: You and/or your care partner have signed paperwork which will be entered into your electronic medical record.  These signatures attest to the fact that that the information above on your After Visit Summary has been reviewed and is understood.  Full responsibility of the confidentiality   of this discharge information lies with you and/or your care-partner.     

## 2012-03-25 NOTE — Progress Notes (Signed)
Patient did not experience any of the following events: a burn prior to discharge; a fall within the facility; wrong site/side/patient/procedure/implant event; or a hospital transfer or hospital admission upon discharge from the facility. (G8907) Patient did not have preoperative order for IV antibiotic SSI prophylaxis. (G8918)  

## 2012-03-25 NOTE — Op Note (Signed)
[facility] [address] [place], [place]  [address]  COLONOSCOPY PROCEDURE REPORT  PATIENT:  David, Hoover  MR#:  [id number] BIRTHDATE:  [birth year], 70 yrs. old  GENDER:  male ENDOSCOPIST:  Iva Boop, MD, [facility]  PROCEDURE DATE:  03/25/2012 PROCEDURE:  Colonoscopy 19147 ASA CLASS:  Class II INDICATIONS:  surveillance and high-risk screening, history of pre-cancerous (adenomatous) colon polyps diminutive adenomas in 1998. None since - hyperplastic cecal polyp (6mm) removed 2009. [facility] MEDICATIONS:   These medications were titrated to patient response per physician's verbal order, Versed 7 mg IV, Fentanyl 75 mcg IV  DESCRIPTION OF PROCEDURE:   After the risks benefits and alternatives of the procedure were thoroughly explained, informed consent was obtained.  Digital rectal exam was performed and revealed no rectal masses and an enlarged prostate.  Known BPH. No nodules. The LB [id number] endoscope was introduced through the anus and advanced to the cecum, which was identified by both the appendix and ileocecal valve, without limitations.  The quality of the prep was adequate, using MoviPrep.  The instrument was then slowly withdrawn as the colon was fully examined. <<PROCEDUREIMAGES>>  FINDINGS:  Mild diverticulosis was found in the sigmoid colon. This was otherwise a normal examination of the colon.   Retroflexed views in the rectum revealed no abnormalities.    The time to cecum = 1:31 minutes. The scope was then withdrawn in 15:20 minutes from the cecum and the procedure completed. COMPLICATIONS:  None ENDOSCOPIC IMPRESSION: 1) Mild diverticulosis in the sigmoid colon 2) Otherwise normal examination - adequalte prep 3) Prior history of adenomatous polyps  REPEAT EXAM:  In 5 year(s) for routine screening colonoscopy.  Iva Boop, MD, [facility]  CC:  [facility]  n. eSIGNED:   Iva Boop at 03/25/2012 10:00 AM  [place], [phone]

## 2012-03-26 ENCOUNTER — Telehealth: Payer: Self-pay

## 2012-03-26 NOTE — Telephone Encounter (Signed)
  Follow up Call-  Call back number 03/25/2012  Post procedure Call Back phone  # [phone]  Permission to leave phone message Yes     Patient questions:  Do you have a fever, pain , or abdominal swelling? no Pain Score  0 *  Have you tolerated food without any problems? yes  Have you been able to return to your normal activities? yes  Do you have any questions about your discharge instructions: Diet   no Medications  no Follow up visit  no  Do you have questions or concerns about your Care? no  Actions: * If pain score is 4 or above: No action needed, pain <4.  Per the pt, "I felt washout yesterday, but I feel okay today".  [place]

## 2012-04-13 ENCOUNTER — Encounter (INDEPENDENT_AMBULATORY_CARE_PROVIDER_SITE_OTHER): Payer: Medicare Other | Admitting: Ophthalmology

## 2012-04-13 DIAGNOSIS — H353290 Exudative age-related macular degeneration, unspecified eye, stage unspecified: Secondary | ICD-10-CM

## 2012-04-13 DIAGNOSIS — H43819 Vitreous degeneration, unspecified eye: Secondary | ICD-10-CM | POA: Diagnosis not present

## 2012-04-13 DIAGNOSIS — H2510 Age-related nuclear cataract, unspecified eye: Secondary | ICD-10-CM | POA: Diagnosis not present

## 2012-04-13 DIAGNOSIS — H3530 Unspecified macular degeneration: Secondary | ICD-10-CM

## 2012-04-21 ENCOUNTER — Other Ambulatory Visit (INDEPENDENT_AMBULATORY_CARE_PROVIDER_SITE_OTHER): Payer: Medicare Other

## 2012-04-21 DIAGNOSIS — E119 Type 2 diabetes mellitus without complications: Secondary | ICD-10-CM

## 2012-04-21 DIAGNOSIS — IMO0001 Reserved for inherently not codable concepts without codable children: Secondary | ICD-10-CM

## 2012-04-21 LAB — HEMOGLOBIN A1C: Hgb A1c MFr Bld: 6.5 % (ref 4.6–6.5)

## 2012-04-21 LAB — HEPATIC FUNCTION PANEL
ALT: 22 U/L (ref 0–53)
AST: 19 U/L (ref 0–37)
Albumin: 4.1 g/dL (ref 3.5–5.2)
Alkaline Phosphatase: 44 U/L (ref 39–117)
Bilirubin, Direct: 0.1 mg/dL (ref 0.0–0.3)
Total Bilirubin: 0.9 mg/dL (ref 0.3–1.2)
Total Protein: 6.5 g/dL (ref 6.0–8.3)

## 2012-04-21 LAB — BASIC METABOLIC PANEL
BUN: 11 mg/dL (ref 6–23)
CO2: 28 mEq/L (ref 19–32)
Calcium: 8.6 mg/dL (ref 8.4–10.5)
Chloride: 106 mEq/L (ref 96–112)
Creatinine, Ser: 0.8 mg/dL (ref 0.4–1.5)
GFR: 99.1 mL/min (ref >60.00)
Glucose, Bld: 127 mg/dL — ABNORMAL HIGH (ref 70–99)
Potassium: 3.7 mEq/L (ref 3.5–5.1)
Sodium: 141 mEq/L (ref 135–145)

## 2012-04-21 LAB — LIPID PANEL
Cholesterol: 130 mg/dL (ref 0–200)
HDL: 44.8 mg/dL (ref >39.00)
LDL Cholesterol: 61 mg/dL (ref 0–99)
Total CHOL/HDL Ratio: 3
Triglycerides: 122 mg/dL (ref 0.0–149.0)
VLDL: 24.4 mg/dL (ref 0.0–40.0)

## 2012-04-23 ENCOUNTER — Other Ambulatory Visit: Payer: Self-pay | Admitting: Internal Medicine

## 2012-04-28 ENCOUNTER — Ambulatory Visit (INDEPENDENT_AMBULATORY_CARE_PROVIDER_SITE_OTHER): Payer: Medicare Other | Admitting: Internal Medicine

## 2012-04-28 ENCOUNTER — Encounter: Payer: Self-pay | Admitting: Internal Medicine

## 2012-04-28 VITALS — BP 142/86 | HR 72 | Temp 98.7°F | Wt 228.0 lb

## 2012-04-28 DIAGNOSIS — E785 Hyperlipidemia, unspecified: Secondary | ICD-10-CM | POA: Diagnosis not present

## 2012-04-28 DIAGNOSIS — E119 Type 2 diabetes mellitus without complications: Secondary | ICD-10-CM

## 2012-04-28 DIAGNOSIS — I10 Essential (primary) hypertension: Secondary | ICD-10-CM

## 2012-04-28 NOTE — Assessment & Plan Note (Signed)
BP Readings from Last 3 Encounters:  04/28/12 142/86  03/25/12 157/86  12/31/11 126/72   Last 2 bps have been elevated. Home BPs 110-120/70s. Continue current meds

## 2012-04-28 NOTE — Assessment & Plan Note (Signed)
Lab Results  Component Value Date   HGBA1C 6.5 04/21/2012   Reasonable control Advised aggressive weight loss Initial goal < 200

## 2012-04-28 NOTE — Progress Notes (Signed)
Patient ID: [name], male   DOB: 20-Jun-1941, 71 y.o.   MRN: [id number]  patient comes in for followup of multiple medical problems including type 2 diabetes, hyperlipidemia, hypertension. The patient does not check blood sugar or blood pressure at home. The patetient does not follow an exercise or diet program. The patient denies any polyuria, polydipsia.  In the past the patient has gone to diabetic treatment center. The patient is tolerating medications  Without difficulty. The patient does admit to medication compliance.   Past Medical History  Diagnosis Date  . Diabetes mellitus   . Diverticulosis of colon   . GERD (gastroesophageal reflux disease)   . Hyperlipidemia   . Hypertension   . OSA (obstructive sleep apnea)   . Colon polyps   . Elevated PSA   . Diabetic retinopathy   . Hypothyroidism   . IBS (irritable bowel syndrome)   . Depression   . Macular degeneration     followed by ophthalmology    History   Social History  . Marital Status: Married    Spouse Name: N/A    Number of Children: N/A  . Years of Education: N/A   Occupational History  . Not on file.   Social History Main Topics  . Smoking status: Former Smoker    Quit date: 02/25/1981  . Smokeless tobacco: Never Used  . Alcohol Use: No  . Drug Use: No  . Sexually Active: Not on file   Other Topics Concern  . Not on file   Social History Narrative  . No narrative on file    Past Surgical History  Procedure Date  . Orif tibia fracture   . Cholecystectomy   . Hemorrhoid surgery   . Laparoscopic gastric banding 03/05/11    weight loss    Family History  Problem Relation Age of Onset  . Cancer Mother 60    stomach  . Hypertension Mother   . Stomach cancer Mother 73  . Alcohol abuse Brother   . Hypertension Paternal Uncle   . Heart attack Paternal Uncle   . Colon cancer Neg Hx   . Colon polyps Neg Hx   . Rectal cancer Neg Hx     Allergies  Allergen Reactions  . Morphine Sulfate     REACTION: rash: itching    Current Outpatient Prescriptions on File Prior to Visit  Medication Sig Dispense Refill  . AMLODIPINE BESYLATE PO Take 10 mg by mouth daily.       . insulin aspart (NOVOLOG) 100 UNIT/ML injection Inject 10 Units into the skin. When sugar is elevated       . levothyroxine (SYNTHROID, LEVOTHROID) 50 MCG tablet Take 50 mcg by mouth daily.        . ONE TOUCH ULTRA TEST test strip CHECK BLOOD SUGAR 4 TIMES A DAY OR AS DIRECTED  125 each  3  . simvastatin (ZOCOR) 40 MG tablet Take 0.5 tablets (20 mg total) by mouth at bedtime.      . traZODone (DESYREL) 50 MG tablet Take 50 mg by mouth at bedtime.           patient denies chest pain, shortness of breath, orthopnea. Denies lower extremity edema, abdominal pain, change in appetite, change in bowel movements. Patient denies rashes, musculoskeletal complaints. No other specific complaints in a complete review of systems.   BP 142/86  Pulse 72  Temp(Src) 98.7 F (37.1 C) (Oral)  Wt 228 lb (103.42 kg)  well-developed well-nourished male in  no acute distress. HEENT exam atraumatic, normocephalic, neck supple without jugular venous distention. Chest clear to auscultation cardiac exam S1-S2 are regular. Abdominal exam overweight with bowel sounds, soft and nontender. Extremities no edema. Neurologic exam is alert with a normal gait.

## 2012-04-28 NOTE — Assessment & Plan Note (Signed)
Controlled Continue current meds 

## 2012-05-12 ENCOUNTER — Other Ambulatory Visit: Payer: Self-pay | Admitting: Dermatology

## 2012-05-12 DIAGNOSIS — D239 Other benign neoplasm of skin, unspecified: Secondary | ICD-10-CM | POA: Diagnosis not present

## 2012-05-12 DIAGNOSIS — D485 Neoplasm of uncertain behavior of skin: Secondary | ICD-10-CM | POA: Diagnosis not present

## 2012-05-12 DIAGNOSIS — D1801 Hemangioma of skin and subcutaneous tissue: Secondary | ICD-10-CM | POA: Diagnosis not present

## 2012-05-12 DIAGNOSIS — L570 Actinic keratosis: Secondary | ICD-10-CM | POA: Diagnosis not present

## 2012-07-01 DIAGNOSIS — N138 Other obstructive and reflux uropathy: Secondary | ICD-10-CM | POA: Diagnosis not present

## 2012-07-01 DIAGNOSIS — N529 Male erectile dysfunction, unspecified: Secondary | ICD-10-CM | POA: Diagnosis not present

## 2012-07-01 DIAGNOSIS — N401 Enlarged prostate with lower urinary tract symptoms: Secondary | ICD-10-CM | POA: Diagnosis not present

## 2012-08-10 ENCOUNTER — Encounter (INDEPENDENT_AMBULATORY_CARE_PROVIDER_SITE_OTHER): Payer: Medicare Other | Admitting: Ophthalmology

## 2012-08-10 DIAGNOSIS — I10 Essential (primary) hypertension: Secondary | ICD-10-CM

## 2012-08-10 DIAGNOSIS — H35039 Hypertensive retinopathy, unspecified eye: Secondary | ICD-10-CM

## 2012-08-10 DIAGNOSIS — H43819 Vitreous degeneration, unspecified eye: Secondary | ICD-10-CM

## 2012-08-10 DIAGNOSIS — H353290 Exudative age-related macular degeneration, unspecified eye, stage unspecified: Secondary | ICD-10-CM | POA: Diagnosis not present

## 2012-08-10 DIAGNOSIS — H3530 Unspecified macular degeneration: Secondary | ICD-10-CM

## 2012-08-10 DIAGNOSIS — H2510 Age-related nuclear cataract, unspecified eye: Secondary | ICD-10-CM

## 2012-08-28 ENCOUNTER — Other Ambulatory Visit: Payer: Self-pay | Admitting: Internal Medicine

## 2012-09-18 ENCOUNTER — Ambulatory Visit (INDEPENDENT_AMBULATORY_CARE_PROVIDER_SITE_OTHER): Payer: Medicare Other

## 2012-09-18 DIAGNOSIS — Z23 Encounter for immunization: Secondary | ICD-10-CM | POA: Diagnosis not present

## 2012-10-16 ENCOUNTER — Other Ambulatory Visit: Payer: Self-pay | Admitting: Internal Medicine

## 2012-10-28 ENCOUNTER — Encounter: Payer: Self-pay | Admitting: Internal Medicine

## 2012-10-28 ENCOUNTER — Ambulatory Visit (INDEPENDENT_AMBULATORY_CARE_PROVIDER_SITE_OTHER): Payer: Medicare Other | Admitting: Internal Medicine

## 2012-10-28 VITALS — BP 134/90 | HR 76 | Temp 98.0°F | Wt 238.0 lb

## 2012-10-28 DIAGNOSIS — I10 Essential (primary) hypertension: Secondary | ICD-10-CM | POA: Diagnosis not present

## 2012-10-28 DIAGNOSIS — E669 Obesity, unspecified: Secondary | ICD-10-CM | POA: Diagnosis not present

## 2012-10-28 DIAGNOSIS — E785 Hyperlipidemia, unspecified: Secondary | ICD-10-CM | POA: Diagnosis not present

## 2012-10-28 DIAGNOSIS — E119 Type 2 diabetes mellitus without complications: Secondary | ICD-10-CM

## 2012-10-28 LAB — LIPID PANEL
Cholesterol: 154 mg/dL (ref 0–200)
HDL: 43.5 mg/dL (ref >39.00)
LDL Cholesterol: 73 mg/dL (ref 0–99)
Total CHOL/HDL Ratio: 4
Triglycerides: 186 mg/dL — ABNORMAL HIGH (ref 0.0–149.0)
VLDL: 37.2 mg/dL (ref 0.0–40.0)

## 2012-10-28 LAB — HEPATIC FUNCTION PANEL
ALT: 23 U/L (ref 0–53)
AST: 18 U/L (ref 0–37)
Albumin: 4.2 g/dL (ref 3.5–5.2)
Alkaline Phosphatase: 41 U/L (ref 39–117)
Bilirubin, Direct: 0.2 mg/dL (ref 0.0–0.3)
Total Bilirubin: 1.3 mg/dL — ABNORMAL HIGH (ref 0.3–1.2)
Total Protein: 7 g/dL (ref 6.0–8.3)

## 2012-10-28 LAB — BASIC METABOLIC PANEL
BUN: 15 mg/dL (ref 6–23)
CO2: 27 mEq/L (ref 19–32)
Calcium: 9 mg/dL (ref 8.4–10.5)
Chloride: 102 mEq/L (ref 96–112)
Creatinine, Ser: 0.9 mg/dL (ref 0.4–1.5)
GFR: 84.52 mL/min (ref >60.00)
Glucose, Bld: 90 mg/dL (ref 70–99)
Potassium: 3.4 mEq/L — ABNORMAL LOW (ref 3.5–5.1)
Sodium: 137 mEq/L (ref 135–145)

## 2012-10-28 LAB — HEMOGLOBIN A1C: Hgb A1c MFr Bld: 5.9 % (ref 4.6–6.5)

## 2012-10-28 NOTE — Progress Notes (Signed)
Patient ID: [name], male   DOB: 06/18/1941, 71 y.o.   MRN: [id number]  patient comes in for followup of multiple medical problems including type 2 diabetes, hyperlipidemia, hypertension. The patient does not check blood sugar or blood pressure at home. The patetient does not follow an exercise or diet program. The patient denies any polyuria, polydipsia.  In the past the patient has gone to diabetic treatment center. The patient is tolerating medications  Without difficulty. The patient does admit to medication compliance.   6 month hx of dysphagia- "sometimes food gets stuck"-   Past Medical History  Diagnosis Date  . Diabetes mellitus   . Diverticulosis of colon   . GERD (gastroesophageal reflux disease)   . Hyperlipidemia   . Hypertension   . OSA (obstructive sleep apnea)   . Colon polyps   . Elevated PSA   . Diabetic retinopathy   . Hypothyroidism   . IBS (irritable bowel syndrome)   . Depression   . Macular degeneration     followed by ophthalmology    History   Social History  . Marital Status: Married    Spouse Name: N/A    Number of Children: N/A  . Years of Education: N/A   Occupational History  . Not on file.   Social History Main Topics  . Smoking status: Former Smoker    Quit date: 02/25/1981  . Smokeless tobacco: Never Used  . Alcohol Use: No  . Drug Use: No  . Sexually Active: Not on file   Other Topics Concern  . Not on file   Social History Narrative  . No narrative on file    Past Surgical History  Procedure Date  . Orif tibia fracture   . Cholecystectomy   . Hemorrhoid surgery   . Laparoscopic gastric banding 03/05/11    weight loss    Family History  Problem Relation Age of Onset  . Cancer Mother 23    stomach  . Hypertension Mother   . Stomach cancer Mother 63  . Alcohol abuse Brother   . Hypertension Paternal Uncle   . Heart attack Paternal Uncle   . Colon cancer Neg Hx   . Colon polyps Neg Hx   . Rectal cancer Neg Hx      Allergies  Allergen Reactions  . Morphine Sulfate     REACTION: rash: itching    Current Outpatient Prescriptions on File Prior to Visit  Medication Sig Dispense Refill  . AMLODIPINE BESYLATE PO Take 10 mg by mouth daily.       . insulin aspart (NOVOLOG) 100 UNIT/ML injection Inject 10 Units into the skin. When sugar is elevated       . levothyroxine (SYNTHROID, LEVOTHROID) 50 MCG tablet Take 50 mcg by mouth daily.        . ONE TOUCH ULTRA TEST test strip CHECK BLOOD SUGAR 4 TIMES A DAY OR AS DIRECTED  100 each  11  . simvastatin (ZOCOR) 40 MG tablet Take 0.5 tablets (20 mg total) by mouth at bedtime.      . traZODone (DESYREL) 50 MG tablet Take 50 mg by mouth at bedtime.           patient denies chest pain, shortness of breath, orthopnea. Denies lower extremity edema, abdominal pain, change in appetite, change in bowel movements. Patient denies rashes, musculoskeletal complaints. No other specific complaints in a complete review of systems.   There were no vitals taken for this visit.  well-developed well-nourished  male in no acute distress. HEENT exam atraumatic, normocephalic, neck supple without jugular venous distention. Chest clear to auscultation cardiac exam S1-S2 are regular. Abdominal exam overweight with bowel sounds, soft and nontender. Extremities no edema. Neurologic exam is alert with a normal gait.

## 2012-10-29 NOTE — Assessment & Plan Note (Signed)
BP: 134/90 mmHg  Fair control He needs to lose weight- Unfortunately he has gained weight after initial weight loss

## 2012-10-29 NOTE — Assessment & Plan Note (Signed)
Check today... Continue meds

## 2012-10-29 NOTE — Assessment & Plan Note (Signed)
He has gained weight  Encouraged aggressive weight loss

## 2012-10-30 ENCOUNTER — Ambulatory Visit: Payer: Medicare Other | Admitting: Internal Medicine

## 2012-10-30 NOTE — Progress Notes (Signed)
Quick Note:  Called and spoke with pt and pt is aware. Labs mailed to home address. ______ 

## 2012-12-02 LAB — HM DIABETES EYE EXAM

## 2012-12-07 ENCOUNTER — Encounter (INDEPENDENT_AMBULATORY_CARE_PROVIDER_SITE_OTHER): Payer: Medicare Other | Admitting: Ophthalmology

## 2012-12-07 DIAGNOSIS — H353290 Exudative age-related macular degeneration, unspecified eye, stage unspecified: Secondary | ICD-10-CM | POA: Diagnosis not present

## 2012-12-07 DIAGNOSIS — H43819 Vitreous degeneration, unspecified eye: Secondary | ICD-10-CM

## 2012-12-07 DIAGNOSIS — H35059 Retinal neovascularization, unspecified, unspecified eye: Secondary | ICD-10-CM | POA: Diagnosis not present

## 2012-12-07 DIAGNOSIS — I10 Essential (primary) hypertension: Secondary | ICD-10-CM

## 2012-12-07 DIAGNOSIS — H3530 Unspecified macular degeneration: Secondary | ICD-10-CM

## 2012-12-07 DIAGNOSIS — H35039 Hypertensive retinopathy, unspecified eye: Secondary | ICD-10-CM

## 2012-12-28 ENCOUNTER — Other Ambulatory Visit (INDEPENDENT_AMBULATORY_CARE_PROVIDER_SITE_OTHER): Payer: Medicare Other | Admitting: Ophthalmology

## 2012-12-28 DIAGNOSIS — H35059 Retinal neovascularization, unspecified, unspecified eye: Secondary | ICD-10-CM | POA: Diagnosis not present

## 2013-04-05 ENCOUNTER — Encounter (INDEPENDENT_AMBULATORY_CARE_PROVIDER_SITE_OTHER): Payer: Medicare Other | Admitting: Ophthalmology

## 2013-04-05 DIAGNOSIS — H3530 Unspecified macular degeneration: Secondary | ICD-10-CM

## 2013-04-05 DIAGNOSIS — H353290 Exudative age-related macular degeneration, unspecified eye, stage unspecified: Secondary | ICD-10-CM | POA: Diagnosis not present

## 2013-04-05 DIAGNOSIS — E11319 Type 2 diabetes mellitus with unspecified diabetic retinopathy without macular edema: Secondary | ICD-10-CM

## 2013-04-05 DIAGNOSIS — I10 Essential (primary) hypertension: Secondary | ICD-10-CM

## 2013-04-05 DIAGNOSIS — H2510 Age-related nuclear cataract, unspecified eye: Secondary | ICD-10-CM

## 2013-04-05 DIAGNOSIS — H35039 Hypertensive retinopathy, unspecified eye: Secondary | ICD-10-CM | POA: Diagnosis not present

## 2013-04-05 DIAGNOSIS — H43819 Vitreous degeneration, unspecified eye: Secondary | ICD-10-CM

## 2013-04-12 ENCOUNTER — Ambulatory Visit (INDEPENDENT_AMBULATORY_CARE_PROVIDER_SITE_OTHER)
Admission: RE | Admit: 2013-04-12 | Discharge: 2013-04-12 | Disposition: A | Discharge status: Home / Self Care | Payer: Medicare Other | Source: Ambulatory Visit | Attending: Family | Admitting: Family

## 2013-04-12 ENCOUNTER — Telehealth: Payer: Self-pay | Admitting: Internal Medicine

## 2013-04-12 ENCOUNTER — Ambulatory Visit (INDEPENDENT_AMBULATORY_CARE_PROVIDER_SITE_OTHER): Payer: Medicare Other | Admitting: Family

## 2013-04-12 ENCOUNTER — Encounter: Payer: Self-pay | Admitting: Family

## 2013-04-12 VITALS — BP 128/80 | HR 92 | Wt 247.0 lb

## 2013-04-12 DIAGNOSIS — M545 Low back pain, unspecified: Secondary | ICD-10-CM

## 2013-04-12 DIAGNOSIS — M5137 Other intervertebral disc degeneration, lumbosacral region: Secondary | ICD-10-CM | POA: Diagnosis not present

## 2013-04-12 DIAGNOSIS — M542 Cervicalgia: Secondary | ICD-10-CM | POA: Diagnosis not present

## 2013-04-12 DIAGNOSIS — M5030 Other cervical disc degeneration, unspecified cervical region: Secondary | ICD-10-CM | POA: Diagnosis not present

## 2013-04-12 MED ORDER — TRAMADOL HCL 50 MG PO TABS: 50.0000 mg | ORAL_TABLET | Freq: Three times a day (TID) | ORAL | Status: DC | PRN

## 2013-04-12 NOTE — Progress Notes (Signed)
Subjective:    Patient ID: [name], male    DOB: [birth year], 72 y.o.   MRN: [id number]  HPI 72 year old white male, retired Cytogeneticist, patient of Dr. swords is in today with complaints of low back pain has been a chronic issue but recently worse. Has been applying heat to lower back that is helped. Reports the pain radiating down his left leg. Describes it as a burning sensation. Today the pain is about a 4/10 but has been up to a 10 out of 10. Also has discomfort in his neck that he describes as achy. The pain does not radiate. Rates the pain a 4/10. Pain is worse with extension. No numbness or tingling. Had x-rays of his neck and back in the past that showed osteoarthritis, 2002. Patient hasn't taken his wife's tramadol for pain relief that helps.   Review of Systems  Constitutional: Negative.   Respiratory: Negative.   Cardiovascular: Negative.   Musculoskeletal: Positive for back pain and arthralgias.       Back pain radiates down the left leg. Neck pain worse with extension  Allergic/Immunologic: Negative.   Neurological: Negative.   Hematological: Negative.   Psychiatric/Behavioral: Negative.    Past Medical History  Diagnosis Date  . Diabetes mellitus   . Diverticulosis of colon   . GERD (gastroesophageal reflux disease)   . Hyperlipidemia   . Hypertension   . OSA (obstructive sleep apnea)   . Colon polyps   . Elevated PSA   . Diabetic retinopathy(362.0)   . Hypothyroidism   . IBS (irritable bowel syndrome)   . Depression   . Macular degeneration     followed by ophthalmology    History   Social History  . Marital Status: Married    Spouse Name: N/A    Number of Children: N/A  . Years of Education: N/A   Occupational History  . Not on file.   Social History Main Topics  . Smoking status: Former Smoker    Quit date: 02/25/1981  . Smokeless tobacco: Never Used  . Alcohol Use: No  . Drug Use: No  . Sexually Active: Not on file   Other Topics Concern   . Not on file   Social History Narrative  . No narrative on file    Past Surgical History  Procedure Laterality Date  . Orif tibia fracture    . Cholecystectomy    . Hemorrhoid surgery    . Laparoscopic gastric banding  03/05/11    weight loss    Family History  Problem Relation Age of Onset  . Cancer Mother 61    stomach  . Hypertension Mother   . Stomach cancer Mother 42  . Alcohol abuse Brother   . Hypertension Paternal Uncle   . Heart attack Paternal Uncle   . Colon cancer Neg Hx   . Colon polyps Neg Hx   . Rectal cancer Neg Hx     Allergies  Allergen Reactions  . Morphine Sulfate     REACTION: rash: itching    Current Outpatient Prescriptions on File Prior to Visit  Medication Sig Dispense Refill  . AMLODIPINE BESYLATE PO Take 10 mg by mouth daily.       . insulin aspart (NOVOLOG) 100 UNIT/ML injection Inject 10 Units into the skin. When sugar is elevated       . insulin glargine (LANTUS) 100 UNIT/ML injection Inject 30 Units into the skin at bedtime.      Marland Kitchen levothyroxine (SYNTHROID, LEVOTHROID) 50  MCG tablet Take 50 mcg by mouth daily.        . ONE TOUCH ULTRA TEST test strip CHECK BLOOD SUGAR 4 TIMES A DAY OR AS DIRECTED  100 each  11  . simvastatin (ZOCOR) 40 MG tablet Take 0.5 tablets (20 mg total) by mouth at bedtime.      . traZODone (DESYREL) 50 MG tablet Take 50 mg by mouth at bedtime.         No current facility-administered medications on file prior to visit.    BP 128/80  Pulse 92  Wt 247 lb (112.038 kg)  BMI 33.49 kg/m2  SpO2 97%chart    Objective:   Physical Exam  Constitutional: He is oriented to person, place, and time. He appears well-developed and well-nourished.  Neck: Neck supple.  Pain elicited to the neck with extension. No tenderness to palpation. No swelling.  Cardiovascular: Normal rate, regular rhythm and normal heart sounds.   Pulmonary/Chest: Effort normal and breath sounds normal.  Musculoskeletal:  No tenderness to  palpation of the lumbar spine. No pain with range of motion. Negative straight leg raise. Pedal pulses 2 out of 2.  Neurological: He is alert and oriented to person, place, and time. He has normal reflexes. He displays normal reflexes. No cranial nerve deficit. Coordination normal.  Skin: Skin is warm and dry.  Psychiatric: He has a normal mood and affect.          Assessment & Plan:  Assessment:  1. Chronic low back pain 2. Neck pain  Plan: Will get up-to-date x-rays of the C-spine and L-spine notify patient pending results. Tramadol one tablet 3 times a day as needed for pain. We'll consider a CT or MRI if necessary. Patient call the office symptoms worsen or persist. Recheck a schedule, and as needed.

## 2013-04-12 NOTE — Telephone Encounter (Signed)
Patient Information:  Caller Name: Evon  Phone: [phone]  Patient: [name], [name]  Gender: Male  DOB: 1941-04-04  Age: 72 Years  PCP: Birdie Sons (Adults only)  Office Follow Up:  Does the office need to follow up with this patient?: No  Instructions For The Office: N/A  RN Note:  FBS 139.  Reports pain radiates to left thigh and causes weakness in left leg.  Pain is significantly improved now.  Ambulatory. Scheduled for bus trip to [place] returning 04/15/13.  Symptoms  Reason For Call & Symptoms: Emergent Call:  Called to get MRI scheuled for chronic back pain aggravated when aas bent over to put air in the tire 04/09/13.  Pain initially was rated 10/10;currently back pain is 4/10.  Reviewed Health History In EMR: Yes  Reviewed Medications In EMR: Yes  Reviewed Allergies In EMR: Yes  Reviewed Surgeries / Procedures: Yes  Date of Onset of Symptoms: 04/09/2013  Treatments Tried: heat wrap, tylenol  Treatments Tried Worked: Yes  Guideline(s) Used:  Back Pain  Disposition Per Guideline:   See Today or Tomorrow in Office  Reason For Disposition Reached:   Pain radiates into the thigh or further down the leg  Advice Given:  Reassurance:  Twisting or heavy lifting can cause back pain.  With treatment, the pain most often goes away in 1-2 weeks.  You can treat most back pain at home.  Here is some care advice that should help.  Cold or Heat:  Cold Pack: For pain or swelling, use a cold pack or ice wrapped in a wet cloth. Put it on the sore area for 20 minutes. Repeat 4 times on the first day, then as needed.  Heat Pack: If pain lasts over 2 days, apply heat to the sore area. Use a heat pack, heating pad, or warm wet washcloth. Do this for 10 minutes, then as needed. For widespread stiffness, take a hot bath or hot shower instead. Move the sore area under the warm water.  Activity  Keep doing your day-to-day activities if it is not too painful. Staying active is better than  resting.  Avoid anything that makes your pain worse. Avoid heavy lifting, twisting, and too much exercise until your back heals.  You do not need to stay in bed.  Pain Medicines:  For pain relief, take acetaminophen, ibuprofen, or naproxen.  Use the lowest amount of medicine that makes your pain feel better.  Pain Medicines:  For pain relief, take acetaminophen, ibuprofen, or naproxen.  Use the lowest amount of medicine that makes your pain feel better.  Ibuprofen (e.g., Motrin, Advil):  Take 400 mg (two 200 mg pills) by mouth every 6 hours.  Another choice is to take 600 mg (three 200 mg pills) by mouth every 8 hours.  The most you should take each day is 1,200 mg (six 200 mg pills), unless your doctor has told you to take more.  Call Back If:  Numbness or weakness occur  Bowel/bladder problems occur  Patient Will Follow Care Advice:  YES  Appointment Scheduled:  04/12/2013 09:45:00 Appointment Scheduled Provider:  Adline Mango [facility])

## 2013-04-12 NOTE — Patient Instructions (Addendum)
Sciatica Sciatica is pain, weakness, numbness, or tingling along the path of the sciatic nerve. The nerve starts in the lower back and runs down the back of each leg. The nerve controls the muscles in the lower leg and in the back of the knee, while also providing sensation to the back of the thigh, lower leg, and the sole of your foot. Sciatica is a symptom of another medical condition. For instance, nerve damage or certain conditions, such as a herniated disk or bone spur on the spine, pinch or put pressure on the sciatic nerve. This causes the pain, weakness, or other sensations normally associated with sciatica. Generally, sciatica only affects one side of the body. CAUSES   Herniated or slipped disc.  Degenerative disk disease.  A pain disorder involving the narrow muscle in the buttocks (piriformis syndrome).  Pelvic injury or fracture.  Pregnancy.  Tumor (rare). SYMPTOMS  Symptoms can vary from mild to very severe. The symptoms usually travel from the low back to the buttocks and down the back of the leg. Symptoms can include:  Mild tingling or dull aches in the lower back, leg, or hip.  Numbness in the back of the calf or sole of the foot.  Burning sensations in the lower back, leg, or hip.  Sharp pains in the lower back, leg, or hip.  Leg weakness.  Severe back pain inhibiting movement. These symptoms may get worse with coughing, sneezing, laughing, or prolonged sitting or standing. Also, being overweight may worsen symptoms. DIAGNOSIS  Your caregiver will perform a physical exam to look for common symptoms of sciatica. He or she may ask you to do certain movements or activities that would trigger sciatic nerve pain. Other tests may be performed to find the cause of the sciatica. These may include:  Blood tests.  X-rays.  Imaging tests, such as an MRI or CT scan. TREATMENT  Treatment is directed at the cause of the sciatic pain. Sometimes, treatment is not necessary  and the pain and discomfort goes away on its own. If treatment is needed, your caregiver may suggest:  Over-the-counter medicines to relieve pain.  Prescription medicines, such as anti-inflammatory medicine, muscle relaxants, or narcotics.  Applying heat or ice to the painful area.  Steroid injections to lessen pain, irritation, and inflammation around the nerve.  Reducing activity during periods of pain.  Exercising and stretching to strengthen your abdomen and improve flexibility of your spine. Your caregiver may suggest losing weight if the extra weight makes the back pain worse.  Physical therapy.  Surgery to eliminate what is pressing or pinching the nerve, such as a bone spur or part of a herniated disk. HOME CARE INSTRUCTIONS   Only take over-the-counter or prescription medicines for pain or discomfort as directed by your caregiver.  Apply ice to the affected area for 20 minutes, 3 4 times a day for the first 48 72 hours. Then try heat in the same way.  Exercise, stretch, or perform your usual activities if these do not aggravate your pain.  Attend physical therapy sessions as directed by your caregiver.  Keep all follow-up appointments as directed by your caregiver.  Do not wear high heels or shoes that do not provide proper support.  Check your mattress to see if it is too soft. A firm mattress may lessen your pain and discomfort. SEEK IMMEDIATE MEDICAL CARE IF:   You lose control of your bowel or bladder (incontinence).  You have increasing weakness in the lower back,   pelvis, buttocks, or legs.  You have redness or swelling of your back.  You have a burning sensation when you urinate.  You have pain that gets worse when you lie down or awakens you at night.  Your pain is worse than you have experienced in the past.  Your pain is lasting longer than 4 weeks.  You are suddenly losing weight without reason. MAKE SURE YOU:  Understand these  instructions.  Will watch your condition.  Will get help right away if you are not doing well or get worse. Document Released: 11/12/2001 Document Revised: 05/19/2012 Document Reviewed: 03/29/2012 [employer] Patient Information 2013 [employer].  

## 2013-04-27 ENCOUNTER — Ambulatory Visit: Payer: Medicare Other | Admitting: Internal Medicine

## 2013-04-28 ENCOUNTER — Other Ambulatory Visit: Payer: Self-pay | Admitting: Family

## 2013-04-28 DIAGNOSIS — M545 Low back pain, unspecified: Secondary | ICD-10-CM

## 2013-04-28 DIAGNOSIS — M1990 Unspecified osteoarthritis, unspecified site: Secondary | ICD-10-CM

## 2013-04-28 DIAGNOSIS — M542 Cervicalgia: Secondary | ICD-10-CM

## 2013-04-30 ENCOUNTER — Telehealth: Payer: Self-pay | Admitting: Internal Medicine

## 2013-04-30 NOTE — Telephone Encounter (Signed)
Pt seen [name] 04/12/13 please advise

## 2013-04-30 NOTE — Telephone Encounter (Signed)
Also, pt still needs to get his MRI done that he requested with [facility]. He has an implant therefore [facility] could not perform the procedure. Pt states that he was told only two or three places would be able to do this for him, [facility], [facility] and/or [facility]? Please advise.

## 2013-04-30 NOTE — Telephone Encounter (Signed)
Please proceed with MRI

## 2013-04-30 NOTE — Telephone Encounter (Signed)
Pt wants a rx for his Tramadol. Please advise.

## 2013-05-01 ENCOUNTER — Other Ambulatory Visit: Payer: Self-pay | Admitting: Family

## 2013-05-03 ENCOUNTER — Telehealth: Payer: Self-pay | Admitting: Internal Medicine

## 2013-05-03 DIAGNOSIS — M47816 Spondylosis without myelopathy or radiculopathy, lumbar region: Secondary | ICD-10-CM

## 2013-05-03 DIAGNOSIS — M47812 Spondylosis without myelopathy or radiculopathy, cervical region: Secondary | ICD-10-CM

## 2013-05-03 NOTE — Telephone Encounter (Signed)
Refill sent in electronically, MRI updated to [facility]

## 2013-05-03 NOTE — Telephone Encounter (Signed)
Pt called to inquire about his MRI, and also his Tramadol. She stated that the pharmacy has not yet received his refill. Please assist.

## 2013-05-04 ENCOUNTER — Other Ambulatory Visit: Payer: Medicare Other

## 2013-05-04 NOTE — Telephone Encounter (Signed)
Pt was told that [facility] cannot do because of pt's implant. (see note below)  Pls advise.Thanks!

## 2013-05-04 NOTE — Telephone Encounter (Signed)
See message from [facility]

## 2013-05-04 NOTE — Telephone Encounter (Signed)
Will refer to [name]

## 2013-05-12 ENCOUNTER — Ambulatory Visit (HOSPITAL_COMMUNITY)
Admission: RE | Admit: 2013-05-12 | Discharge: 2013-05-12 | Disposition: A | Discharge status: Home / Self Care | Payer: Medicare Other | Source: Ambulatory Visit | Attending: Family | Admitting: Family

## 2013-05-12 ENCOUNTER — Other Ambulatory Visit: Payer: Self-pay | Admitting: Family

## 2013-05-12 DIAGNOSIS — M51379 Other intervertebral disc degeneration, lumbosacral region without mention of lumbar back pain or lower extremity pain: Secondary | ICD-10-CM | POA: Insufficient documentation

## 2013-05-12 DIAGNOSIS — M5137 Other intervertebral disc degeneration, lumbosacral region: Secondary | ICD-10-CM | POA: Diagnosis not present

## 2013-05-12 DIAGNOSIS — M545 Low back pain, unspecified: Secondary | ICD-10-CM | POA: Insufficient documentation

## 2013-05-12 DIAGNOSIS — M47816 Spondylosis without myelopathy or radiculopathy, lumbar region: Secondary | ICD-10-CM

## 2013-05-12 DIAGNOSIS — M4310 Spondylolisthesis, site unspecified: Secondary | ICD-10-CM | POA: Insufficient documentation

## 2013-05-12 DIAGNOSIS — M7130 Other bursal cyst, unspecified site: Secondary | ICD-10-CM | POA: Insufficient documentation

## 2013-05-12 DIAGNOSIS — M47812 Spondylosis without myelopathy or radiculopathy, cervical region: Secondary | ICD-10-CM

## 2013-05-12 DIAGNOSIS — M542 Cervicalgia: Secondary | ICD-10-CM | POA: Insufficient documentation

## 2013-05-12 DIAGNOSIS — M5126 Other intervertebral disc displacement, lumbar region: Secondary | ICD-10-CM | POA: Diagnosis not present

## 2013-05-12 DIAGNOSIS — M5146 Schmorl's nodes, lumbar region: Secondary | ICD-10-CM | POA: Insufficient documentation

## 2013-05-12 DIAGNOSIS — IMO0002 Reserved for concepts with insufficient information to code with codable children: Secondary | ICD-10-CM

## 2013-05-14 ENCOUNTER — Telehealth: Payer: Self-pay | Admitting: Internal Medicine

## 2013-05-14 NOTE — Telephone Encounter (Signed)
PT wife calling to obtain results from MRI completed on 05/12/13. Wife stated that the neurosurgeon has called to schedule something, but they're not sure what is going on. Please assist.

## 2013-05-14 NOTE — Telephone Encounter (Signed)
Please asdvise

## 2013-05-14 NOTE — Telephone Encounter (Signed)
Results discussed with pt and wife. He has an ppt to see neuro on 05/25/13

## 2013-05-14 NOTE — Telephone Encounter (Signed)
Please discuss MRI results

## 2013-05-17 ENCOUNTER — Ambulatory Visit: Payer: Medicare Other | Admitting: Family

## 2013-05-17 NOTE — Telephone Encounter (Signed)
He may have a pinched nerve in his neck. Followup with neurosurgery

## 2013-05-21 ENCOUNTER — Other Ambulatory Visit (INDEPENDENT_AMBULATORY_CARE_PROVIDER_SITE_OTHER): Payer: Medicare Other

## 2013-05-21 DIAGNOSIS — Z125 Encounter for screening for malignant neoplasm of prostate: Secondary | ICD-10-CM

## 2013-05-21 LAB — PSA: PSA: 5.41 ng/mL — ABNORMAL HIGH (ref <=4.00)

## 2013-05-21 NOTE — Progress Notes (Signed)
Pt in today for labs ordered by dr Jonny Ruiz [facility] only.

## 2013-05-25 DIAGNOSIS — M47817 Spondylosis without myelopathy or radiculopathy, lumbosacral region: Secondary | ICD-10-CM | POA: Diagnosis not present

## 2013-05-25 DIAGNOSIS — M47812 Spondylosis without myelopathy or radiculopathy, cervical region: Secondary | ICD-10-CM | POA: Diagnosis not present

## 2013-05-26 ENCOUNTER — Ambulatory Visit (INDEPENDENT_AMBULATORY_CARE_PROVIDER_SITE_OTHER): Payer: Medicare Other | Admitting: Internal Medicine

## 2013-05-26 ENCOUNTER — Encounter: Payer: Self-pay | Admitting: Internal Medicine

## 2013-05-26 VITALS — BP 146/86 | HR 76 | Temp 98.6°F | Ht 72.0 in | Wt 245.0 lb

## 2013-05-26 DIAGNOSIS — R1310 Dysphagia, unspecified: Secondary | ICD-10-CM | POA: Diagnosis not present

## 2013-05-26 DIAGNOSIS — E785 Hyperlipidemia, unspecified: Secondary | ICD-10-CM

## 2013-05-26 DIAGNOSIS — E1159 Type 2 diabetes mellitus with other circulatory complications: Secondary | ICD-10-CM | POA: Diagnosis not present

## 2013-05-26 DIAGNOSIS — E119 Type 2 diabetes mellitus without complications: Secondary | ICD-10-CM

## 2013-05-26 DIAGNOSIS — E669 Obesity, unspecified: Secondary | ICD-10-CM

## 2013-05-26 DIAGNOSIS — I10 Essential (primary) hypertension: Secondary | ICD-10-CM

## 2013-05-26 LAB — MICROALBUMIN / CREATININE URINE RATIO
Creatinine,U: 82.3 mg/dL
Microalb Creat Ratio: 0.9 mg/g (ref 0.0–30.0)
Microalb, Ur: 0.7 mg/dL (ref 0.0–1.9)

## 2013-05-26 LAB — BASIC METABOLIC PANEL
BUN: 10 mg/dL (ref 6–23)
CO2: 26 mEq/L (ref 19–32)
Calcium: 8.9 mg/dL (ref 8.4–10.5)
Chloride: 102 mEq/L (ref 96–112)
Creatinine, Ser: 1 mg/dL (ref 0.4–1.5)
GFR: 76.79 mL/min (ref >60.00)
Glucose, Bld: 278 mg/dL — ABNORMAL HIGH (ref 70–99)
Potassium: 3.6 mEq/L (ref 3.5–5.1)
Sodium: 137 mEq/L (ref 135–145)

## 2013-05-26 LAB — LIPID PANEL
Cholesterol: 132 mg/dL (ref 0–200)
HDL: 41 mg/dL (ref >39.00)
Total CHOL/HDL Ratio: 3
Triglycerides: 363 mg/dL — ABNORMAL HIGH (ref 0.0–149.0)
VLDL: 72.6 mg/dL — ABNORMAL HIGH (ref 0.0–40.0)

## 2013-05-26 LAB — HEPATIC FUNCTION PANEL
ALT: 29 U/L (ref 0–53)
AST: 20 U/L (ref 0–37)
Albumin: 3.9 g/dL (ref 3.5–5.2)
Alkaline Phosphatase: 39 U/L (ref 39–117)
Bilirubin, Direct: 0.1 mg/dL (ref 0.0–0.3)
Total Bilirubin: 1 mg/dL (ref 0.3–1.2)
Total Protein: 6.6 g/dL (ref 6.0–8.3)

## 2013-05-26 LAB — HEMOGLOBIN A1C: Hgb A1c MFr Bld: 6.2 % (ref 4.6–6.5)

## 2013-05-26 LAB — LDL CHOLESTEROL, DIRECT: Direct LDL: 64.6 mg/dL

## 2013-05-26 MED ORDER — TRAMADOL HCL 50 MG PO TABS: ORAL_TABLET | ORAL | Status: DC

## 2013-05-26 NOTE — Progress Notes (Signed)
Patient ID: [name], male   DOB: March 24, 1941, 72 y.o.   MRN: [id number]   patient comes in for followup of multiple medical problems including type 2 diabetes, hyperlipidemia, hypertension. The patient does not check blood sugar or blood pressure at home. The patetient does not follow an exercise or diet program. The patient denies any polyuria, polydipsia.  In the past the patient has gone to diabetic treatment center. The patient is tolerating medications  Without difficulty. The patient does admit to medication compliance.   Back pain-- scheduled for therapy  Weight- he has gained weight.  Trouble swallowing-- he states that "food gets stuck" when eating- happens several times weekly  Reviewed pmh, psh, sochx Reviewed meds   patient denies chest pain, shortness of breath, orthopnea. Denies lower extremity edema, abdominal pain, change in appetite, change in bowel movements. Patient denies rashes, musculoskeletal complaints. No other specific complaints in a complete review of systems.    well-developed well-nourished male in no acute distress. HEENT exam atraumatic, normocephalic, neck supple without jugular venous distention. Chest clear to auscultation cardiac exam S1-S2 are regular. Abdominal exam overweight with bowel sounds, soft and nontender. Extremities no edema. Neurologic exam is alert with a normal gait.

## 2013-05-28 ENCOUNTER — Encounter: Payer: Self-pay | Admitting: Internal Medicine

## 2013-05-31 NOTE — Assessment & Plan Note (Signed)
BP Readings from Last 3 Encounters:  05/26/13 146/86  04/12/13 128/80  10/28/12 134/90   fair control. We'll continue current medications. Monitor blood pressure at home.

## 2013-05-31 NOTE — Assessment & Plan Note (Signed)
Patient has gained weight since his lap band surgery. Advised extraordinary measures to limit calorie intake.

## 2013-05-31 NOTE — Assessment & Plan Note (Signed)
I'm concerned with the patient's weight gain. We'll continue current medications for the time being. Note that he is on Lantus and NovoLog the

## 2013-05-31 NOTE — Assessment & Plan Note (Signed)
Lipid Panel     Component Value Date/Time   CHOL 132 05/26/2013 0908   TRIG 363.0* 05/26/2013 0908   HDL 41.00 05/26/2013 0908   CHOLHDL 3 05/26/2013 0908   VLDL 72.6* 05/26/2013 0908   LDLCALC 73 10/28/2012 0941   Continue current medications per

## 2013-06-01 DIAGNOSIS — R351 Nocturia: Secondary | ICD-10-CM | POA: Diagnosis not present

## 2013-06-01 DIAGNOSIS — N529 Male erectile dysfunction, unspecified: Secondary | ICD-10-CM | POA: Diagnosis not present

## 2013-06-01 DIAGNOSIS — N401 Enlarged prostate with lower urinary tract symptoms: Secondary | ICD-10-CM | POA: Diagnosis not present

## 2013-06-01 DIAGNOSIS — N138 Other obstructive and reflux uropathy: Secondary | ICD-10-CM | POA: Diagnosis not present

## 2013-06-01 DIAGNOSIS — R972 Elevated prostate specific antigen [PSA]: Secondary | ICD-10-CM | POA: Diagnosis not present

## 2013-06-07 ENCOUNTER — Ambulatory Visit: Payer: Medicare Other | Attending: Neurosurgery | Admitting: Physical Therapy

## 2013-06-07 DIAGNOSIS — R293 Abnormal posture: Secondary | ICD-10-CM | POA: Diagnosis not present

## 2013-06-07 DIAGNOSIS — IMO0001 Reserved for inherently not codable concepts without codable children: Secondary | ICD-10-CM | POA: Insufficient documentation

## 2013-06-07 DIAGNOSIS — M542 Cervicalgia: Secondary | ICD-10-CM | POA: Insufficient documentation

## 2013-06-07 DIAGNOSIS — M2560 Stiffness of unspecified joint, not elsewhere classified: Secondary | ICD-10-CM | POA: Diagnosis not present

## 2013-06-10 ENCOUNTER — Other Ambulatory Visit: Payer: Self-pay

## 2013-06-10 ENCOUNTER — Ambulatory Visit: Payer: Medicare Other | Admitting: Physical Therapy

## 2013-06-14 ENCOUNTER — Encounter: Payer: Medicare Other | Admitting: Physical Therapy

## 2013-06-17 ENCOUNTER — Encounter: Payer: Medicare Other | Admitting: Physical Therapy

## 2013-06-22 DIAGNOSIS — M545 Low back pain, unspecified: Secondary | ICD-10-CM | POA: Diagnosis not present

## 2013-06-22 DIAGNOSIS — M5137 Other intervertebral disc degeneration, lumbosacral region: Secondary | ICD-10-CM | POA: Diagnosis not present

## 2013-06-22 DIAGNOSIS — G894 Chronic pain syndrome: Secondary | ICD-10-CM | POA: Diagnosis not present

## 2013-06-22 DIAGNOSIS — M542 Cervicalgia: Secondary | ICD-10-CM | POA: Diagnosis not present

## 2013-06-23 ENCOUNTER — Encounter: Payer: Self-pay | Admitting: *Deleted

## 2013-07-12 ENCOUNTER — Ambulatory Visit (INDEPENDENT_AMBULATORY_CARE_PROVIDER_SITE_OTHER): Payer: Medicare Other | Admitting: Internal Medicine

## 2013-07-12 ENCOUNTER — Encounter: Payer: Self-pay | Admitting: Internal Medicine

## 2013-07-12 VITALS — BP 160/80 | HR 80 | Ht 72.0 in | Wt 244.6 lb

## 2013-07-12 DIAGNOSIS — R112 Nausea with vomiting, unspecified: Secondary | ICD-10-CM | POA: Diagnosis not present

## 2013-07-12 DIAGNOSIS — R1319 Other dysphagia: Secondary | ICD-10-CM | POA: Diagnosis not present

## 2013-07-12 DIAGNOSIS — R1013 Epigastric pain: Secondary | ICD-10-CM

## 2013-07-12 NOTE — Progress Notes (Signed)
  Subjective:    Patient ID: [name], male    DOB: [birth year], 72 y.o.   MRN: [id number]  HPI The patient is here with a 6-7 month hx of dysphagia to solids and liquids and regurgitation and nausea and vomiting. He has epigastric pain also.Occurs when it feels like the food will not pass through. He has had a lap band placed in 2012 and lost about 20-30 #.    He is concerned that he might have recurrent peptic ulcer disease, and also because his mother had stomach cancer and wonders if he is at risk. Medications, allergies, past medical history, past surgical history, family history and social history are reviewed and updated in the EMR.  Review of Systems As above    Objective:   Physical Exam General:  NAD Eyes:   anicteric Lungs:  clear Heart:  S1S2 no rubs, murmurs or gallops Abdomen:  soft and nontender, BS+, palpable lap band port in right mid abdome  Data Reviewed:  2012 UGI post-op lap band    Assessment & Plan:  Dysphagia -   Nausea with vomiting   Abdominal pain, epigastric   His scenario is concerning for problems with his lap band - ? Migration of lap band. Shouldn't be too tight since he was ok after last injection/tightening in 2012. Will start evaluation with upper GI series. Could need an EGD pending that but I explained that need UGI first and that dilation would not help if lap band was causing the problem.

## 2013-07-12 NOTE — Patient Instructions (Addendum)
You have been scheduled for an abdominal ultrasound at [facility] (1st floor of hospital) on 07/16/13 at 9:30am. Please arrive 15 minutes prior to your appointment for registration. Make certain not to have anything to eat or drink 6 hours prior to your appointment. Should you need to reschedule your appointment, please contact radiology at [phone]. This test typically takes about 30 minutes to perform.  We will call you with results and plans.   I appreciate the opportunity to care for you.

## 2013-07-13 DIAGNOSIS — M542 Cervicalgia: Secondary | ICD-10-CM | POA: Diagnosis not present

## 2013-07-13 DIAGNOSIS — M5126 Other intervertebral disc displacement, lumbar region: Secondary | ICD-10-CM | POA: Diagnosis not present

## 2013-07-13 DIAGNOSIS — M47817 Spondylosis without myelopathy or radiculopathy, lumbosacral region: Secondary | ICD-10-CM | POA: Diagnosis not present

## 2013-07-16 ENCOUNTER — Ambulatory Visit (HOSPITAL_COMMUNITY)
Admission: RE | Admit: 2013-07-16 | Discharge: 2013-07-16 | Disposition: A | Discharge status: Home / Self Care | Payer: Medicare Other | Source: Ambulatory Visit | Attending: Internal Medicine | Admitting: Internal Medicine

## 2013-07-16 DIAGNOSIS — K219 Gastro-esophageal reflux disease without esophagitis: Secondary | ICD-10-CM | POA: Diagnosis not present

## 2013-07-16 DIAGNOSIS — R109 Unspecified abdominal pain: Secondary | ICD-10-CM | POA: Diagnosis not present

## 2013-07-16 DIAGNOSIS — R112 Nausea with vomiting, unspecified: Secondary | ICD-10-CM

## 2013-07-16 DIAGNOSIS — R6889 Other general symptoms and signs: Secondary | ICD-10-CM | POA: Insufficient documentation

## 2013-07-16 DIAGNOSIS — K449 Diaphragmatic hernia without obstruction or gangrene: Secondary | ICD-10-CM | POA: Insufficient documentation

## 2013-07-16 DIAGNOSIS — Z9884 Bariatric surgery status: Secondary | ICD-10-CM | POA: Diagnosis not present

## 2013-07-16 DIAGNOSIS — R1319 Other dysphagia: Secondary | ICD-10-CM

## 2013-07-16 DIAGNOSIS — R1013 Epigastric pain: Secondary | ICD-10-CM

## 2013-07-16 NOTE — Progress Notes (Signed)
Quick Note:  Let him know I am communicating with Dr. Daphine Deutscher about what to do next - nothing out of the ordinary here to my knowledge (with a lap band in) but I ? If he needs to have the band fluid taken out ______

## 2013-07-26 NOTE — Progress Notes (Signed)
Quick Note:  I have discussed with Dr. Daphine Deutscher  Patient needs to see him and have his band adjusted - fluid withdrawn.  Please arrange appointment ______

## 2013-08-06 DIAGNOSIS — M542 Cervicalgia: Secondary | ICD-10-CM | POA: Diagnosis not present

## 2013-08-11 ENCOUNTER — Ambulatory Visit (INDEPENDENT_AMBULATORY_CARE_PROVIDER_SITE_OTHER): Payer: Medicare Other

## 2013-08-11 DIAGNOSIS — Z23 Encounter for immunization: Secondary | ICD-10-CM | POA: Diagnosis not present

## 2013-08-12 ENCOUNTER — Ambulatory Visit (INDEPENDENT_AMBULATORY_CARE_PROVIDER_SITE_OTHER): Payer: Medicare Other | Admitting: Surgery

## 2013-08-12 ENCOUNTER — Encounter (INDEPENDENT_AMBULATORY_CARE_PROVIDER_SITE_OTHER): Payer: Self-pay | Admitting: Surgery

## 2013-08-12 VITALS — BP 104/90 | HR 80 | Resp 16 | Ht 72.0 in | Wt 241.6 lb

## 2013-08-12 DIAGNOSIS — Z9884 Bariatric surgery status: Secondary | ICD-10-CM

## 2013-08-12 DIAGNOSIS — Z4651 Encounter for fitting and adjustment of gastric lap band: Secondary | ICD-10-CM

## 2013-08-12 HISTORY — DX: Bariatric surgery status: Z98.84

## 2013-08-12 NOTE — Progress Notes (Signed)
Lapband Fill Encounter Problem List:   Patient Active Problem List   Diagnosis Date Noted  . Elevated PSA 02/28/2011  . DEPRESSION 09/21/2009  . OBESITY 10/25/2008  . PERSONAL HX COLONIC POLYPS 08/22/2008  . ERECTILE DYSFUNCTION 12/30/2007  . DIABETES MELLITUS, TYPE II 09/24/2007  . DIABETIC  RETINOPATHY 09/24/2007  . HYPERLIPIDEMIA 09/24/2007  . OBSTRUCTIVE SLEEP APNEA 09/24/2007  . HYPERTENSION 09/24/2007    [name] Body mass index is 32.76 kg/(m^2). Weight loss since surgery  0  Having regurgitation?:  no  Feel that they need a fill?  Wants unfill  Nocturnal reflux?  no  Amount of fill  -0.25     Instructions given and weight loss goals discussed.   He hasn't been here in 2 years.   His main problem is that he hasn't adequate teeth to properly masticate.  I spoke with him and his wife for at least 15 minutes about changing the food groups that he eats and avoiding sweeteners and sugars. He obstructs and wants some fluid taken out of his band.   He just had his 5 of birth they and is being very stubborn. I went ahead and removed 0.25 cc from his band and we'll see him back in 3 months.  Matt B. Daphine Deutscher, MD, FACS

## 2013-08-12 NOTE — Patient Instructions (Signed)

## 2013-08-16 ENCOUNTER — Encounter (INDEPENDENT_AMBULATORY_CARE_PROVIDER_SITE_OTHER): Payer: Medicare Other | Admitting: Ophthalmology

## 2013-08-16 DIAGNOSIS — H353290 Exudative age-related macular degeneration, unspecified eye, stage unspecified: Secondary | ICD-10-CM

## 2013-08-16 DIAGNOSIS — E11319 Type 2 diabetes mellitus with unspecified diabetic retinopathy without macular edema: Secondary | ICD-10-CM

## 2013-08-16 DIAGNOSIS — E1139 Type 2 diabetes mellitus with other diabetic ophthalmic complication: Secondary | ICD-10-CM | POA: Diagnosis not present

## 2013-08-16 DIAGNOSIS — H3530 Unspecified macular degeneration: Secondary | ICD-10-CM

## 2013-08-16 DIAGNOSIS — H43819 Vitreous degeneration, unspecified eye: Secondary | ICD-10-CM

## 2013-08-16 DIAGNOSIS — H35039 Hypertensive retinopathy, unspecified eye: Secondary | ICD-10-CM

## 2013-08-16 DIAGNOSIS — H2510 Age-related nuclear cataract, unspecified eye: Secondary | ICD-10-CM

## 2013-08-16 DIAGNOSIS — I10 Essential (primary) hypertension: Secondary | ICD-10-CM

## 2013-08-27 ENCOUNTER — Telehealth: Payer: Self-pay | Admitting: Internal Medicine

## 2013-08-27 NOTE — Telephone Encounter (Signed)
Pt wife called and stated that the pt had 4 more refills of his traMADol (ULTRAM) 50 MG tablet, but the pharmacy will not fill them. They are requesting a new RX be called in, please assist. ([facility] in [place])

## 2013-08-30 MED ORDER — TRAMADOL HCL 50 MG PO TABS: ORAL_TABLET | ORAL | Status: DC

## 2013-08-30 NOTE — Telephone Encounter (Signed)
Ok per Dr Cato Mulligan, rx called in to [facility] pharmacy

## 2013-09-01 DIAGNOSIS — M545 Low back pain, unspecified: Secondary | ICD-10-CM | POA: Diagnosis not present

## 2013-09-01 DIAGNOSIS — M542 Cervicalgia: Secondary | ICD-10-CM | POA: Diagnosis not present

## 2013-10-11 ENCOUNTER — Encounter (INDEPENDENT_AMBULATORY_CARE_PROVIDER_SITE_OTHER): Payer: Medicare Other | Admitting: Ophthalmology

## 2013-10-11 DIAGNOSIS — H353290 Exudative age-related macular degeneration, unspecified eye, stage unspecified: Secondary | ICD-10-CM | POA: Diagnosis not present

## 2013-10-11 DIAGNOSIS — E11319 Type 2 diabetes mellitus with unspecified diabetic retinopathy without macular edema: Secondary | ICD-10-CM | POA: Diagnosis not present

## 2013-10-11 DIAGNOSIS — I10 Essential (primary) hypertension: Secondary | ICD-10-CM

## 2013-10-11 DIAGNOSIS — H3530 Unspecified macular degeneration: Secondary | ICD-10-CM

## 2013-10-11 DIAGNOSIS — E1139 Type 2 diabetes mellitus with other diabetic ophthalmic complication: Secondary | ICD-10-CM | POA: Diagnosis not present

## 2013-10-11 DIAGNOSIS — H43819 Vitreous degeneration, unspecified eye: Secondary | ICD-10-CM

## 2013-10-11 DIAGNOSIS — H2510 Age-related nuclear cataract, unspecified eye: Secondary | ICD-10-CM

## 2013-10-11 DIAGNOSIS — H35039 Hypertensive retinopathy, unspecified eye: Secondary | ICD-10-CM

## 2013-10-26 ENCOUNTER — Other Ambulatory Visit (INDEPENDENT_AMBULATORY_CARE_PROVIDER_SITE_OTHER): Payer: Medicare Other

## 2013-10-26 DIAGNOSIS — C61 Malignant neoplasm of prostate: Secondary | ICD-10-CM

## 2013-10-27 LAB — PSA, TOTAL AND FREE
PSA, Free Pct: 27.8 %
PSA, Free: 1.89 ng/mL
PSA: 6.8 ng/mL — ABNORMAL HIGH (ref 0.0–4.0)

## 2013-11-01 LAB — HM DIABETES EYE EXAM

## 2013-11-02 DIAGNOSIS — R972 Elevated prostate specific antigen [PSA]: Secondary | ICD-10-CM | POA: Diagnosis not present

## 2013-11-02 DIAGNOSIS — N529 Male erectile dysfunction, unspecified: Secondary | ICD-10-CM | POA: Diagnosis not present

## 2013-11-02 DIAGNOSIS — N401 Enlarged prostate with lower urinary tract symptoms: Secondary | ICD-10-CM | POA: Diagnosis not present

## 2013-11-02 DIAGNOSIS — N138 Other obstructive and reflux uropathy: Secondary | ICD-10-CM | POA: Diagnosis not present

## 2013-11-02 DIAGNOSIS — N139 Obstructive and reflux uropathy, unspecified: Secondary | ICD-10-CM | POA: Diagnosis not present

## 2013-11-15 ENCOUNTER — Encounter: Payer: Self-pay | Admitting: *Deleted

## 2013-11-16 ENCOUNTER — Ambulatory Visit (INDEPENDENT_AMBULATORY_CARE_PROVIDER_SITE_OTHER): Payer: Medicare Other | Admitting: Internal Medicine

## 2013-11-16 ENCOUNTER — Encounter: Payer: Self-pay | Admitting: Internal Medicine

## 2013-11-16 VITALS — BP 130/76 | HR 88 | Temp 98.6°F | Resp 16 | Ht 72.0 in | Wt 236.0 lb

## 2013-11-16 DIAGNOSIS — E119 Type 2 diabetes mellitus without complications: Secondary | ICD-10-CM

## 2013-11-16 DIAGNOSIS — Z4651 Encounter for fitting and adjustment of gastric lap band: Secondary | ICD-10-CM | POA: Diagnosis not present

## 2013-11-16 DIAGNOSIS — E1159 Type 2 diabetes mellitus with other circulatory complications: Secondary | ICD-10-CM

## 2013-11-16 DIAGNOSIS — Z79899 Other long term (current) drug therapy: Secondary | ICD-10-CM | POA: Diagnosis not present

## 2013-11-16 DIAGNOSIS — I10 Essential (primary) hypertension: Secondary | ICD-10-CM

## 2013-11-16 LAB — LIPID PANEL
Cholesterol: 153 mg/dL (ref 0–200)
HDL: 43 mg/dL (ref >39.00)
Total CHOL/HDL Ratio: 4
Triglycerides: 237 mg/dL — ABNORMAL HIGH (ref 0.0–149.0)
VLDL: 47.4 mg/dL — ABNORMAL HIGH (ref 0.0–40.0)

## 2013-11-16 LAB — BASIC METABOLIC PANEL
BUN: 16 mg/dL (ref 6–23)
CO2: 27 mEq/L (ref 19–32)
Calcium: 9.4 mg/dL (ref 8.4–10.5)
Chloride: 101 mEq/L (ref 96–112)
Creatinine, Ser: 1.2 mg/dL (ref 0.4–1.5)
GFR: 64.81 mL/min (ref >60.00)
Glucose, Bld: 141 mg/dL — ABNORMAL HIGH (ref 70–99)
Potassium: 3.9 mEq/L (ref 3.5–5.1)
Sodium: 138 mEq/L (ref 135–145)

## 2013-11-16 LAB — HEPATIC FUNCTION PANEL
ALT: 29 U/L (ref 0–53)
AST: 21 U/L (ref 0–37)
Albumin: 4.7 g/dL (ref 3.5–5.2)
Alkaline Phosphatase: 56 U/L (ref 39–117)
Bilirubin, Direct: 0.3 mg/dL (ref 0.0–0.3)
Total Bilirubin: 1.7 mg/dL — ABNORMAL HIGH (ref 0.3–1.2)
Total Protein: 7 g/dL (ref 6.0–8.3)

## 2013-11-16 LAB — HM DIABETES FOOT EXAM

## 2013-11-16 LAB — MICROALBUMIN / CREATININE URINE RATIO
Creatinine,U: 273.6 mg/dL
Microalb Creat Ratio: 5.4 mg/g (ref 0.0–30.0)
Microalb, Ur: 14.8 mg/dL — ABNORMAL HIGH (ref 0.0–1.9)

## 2013-11-16 LAB — HEMOGLOBIN A1C: Hgb A1c MFr Bld: 6.2 % (ref 4.6–6.5)

## 2013-11-16 NOTE — Progress Notes (Signed)
Pre visit review using our clinic review tool, if applicable. No additional management support is needed unless otherwise documented below in the visit note. 

## 2013-11-16 NOTE — Progress Notes (Signed)
psa- being followed by dr Annabell Howells   patient comes in for followup of multiple medical problems including type 2 diabetes, hyperlipidemia, hypertension. The patient does not check blood sugar or blood pressure at home. The patetient does not follow an exercise or diet program. The patient denies any polyuria, polydipsia.  In the past the patient has gone to diabetic treatment center. The patient is tolerating medications  Without difficulty. The patient does admit to medication compliance.   Past Medical History  Diagnosis Date  . Diabetes mellitus   . Diverticulosis of colon   . GERD (gastroesophageal reflux disease)   . Hyperlipidemia   . Hypertension   . OSA (obstructive sleep apnea)   . Colon polyps   . Elevated PSA   . Diabetic retinopathy   . Hypothyroidism   . IBS (irritable bowel syndrome)   . Depression   . Macular degeneration     followed by ophthalmology  . Arthritis     History   Social History  . Marital Status: Married    Spouse Name: N/A    Number of Children: 5  . Years of Education: N/A   Occupational History  . retired    Social History Main Topics  . Smoking status: Former Smoker    Quit date: 02/25/1981  . Smokeless tobacco: Never Used  . Alcohol Use: No  . Drug Use: No  . Sexual Activity: Not on file   Other Topics Concern  . Not on file   Social History Narrative  . No narrative on file    Past Surgical History  Procedure Laterality Date  . Orif tibia fracture    . Cholecystectomy    . Hemorrhoid surgery    . Laparoscopic gastric banding  03/05/11    weight loss  . Colonoscopy  03/25/12, 09/28/08  . Esophagogastroduodenoscopy endoscopy  09/28/08  . Penile prosthesis implant    . Laparoscopic gastric banding with hiatal hernia repair  03/05/2011  . Foot surgery Bilateral   . Shoulder surgery Right 2011  . Vasectomy      Family History  Problem Relation Age of Onset  . Hypertension Mother   . Stomach cancer Mother 2  . Alcohol abuse  Brother   . Hypertension Paternal Uncle   . Heart attack Paternal Uncle   . Colon cancer Neg Hx   . Colon polyps Neg Hx   . Rectal cancer Neg Hx     Allergies  Allergen Reactions  . Morphine Sulfate     REACTION: rash: itching    Current Outpatient Prescriptions on File Prior to Visit  Medication Sig Dispense Refill  . AMLODIPINE BESYLATE PO Take 10 mg by mouth daily.       . insulin aspart (NOVOLOG) 100 UNIT/ML injection Inject 20 Units into the skin. When sugar is elevated      . insulin glargine (LANTUS) 100 UNIT/ML injection Inject 30 Units into the skin at bedtime.      Marland Kitchen levothyroxine (SYNTHROID, LEVOTHROID) 50 MCG tablet Take 50 mcg by mouth daily.        . ONE TOUCH ULTRA TEST test strip CHECK BLOOD SUGAR 4 TIMES A DAY OR AS DIRECTED  100 each  11  . simvastatin (ZOCOR) 40 MG tablet Take 0.5 tablets (20 mg total) by mouth at bedtime.      . traMADol (ULTRAM) 50 MG tablet TAKE ONE TABLET BY MOUTH EVERY 8 HOURS AS NEEDED FOR PAIN  30 tablet  5  . traZODone (DESYREL)  100 MG tablet Take 100 mg by mouth at bedtime.       No current facility-administered medications on file prior to visit.     patient denies chest pain, shortness of breath, orthopnea. Denies lower extremity edema, abdominal pain, change in appetite, change in bowel movements. Patient denies rashes, musculoskeletal complaints. No other specific complaints in a complete review of systems.   BP 130/76  Pulse 88  Temp(Src) 98.6 F (37 C)  Resp 16  Ht 6' (1.829 m)  Wt 236 lb (107.049 kg)  BMI 32.00 kg/m2  well-developed well-nourished male in no acute distress. HEENT exam atraumatic, normocephalic, neck supple without jugular venous distention. Chest clear to auscultation cardiac exam S1-S2 are regular. Abdominal exam overweight with bowel sounds, soft and nontender. Extremities no edema. Neurologic exam is alert with a normal gait.

## 2013-11-17 LAB — LDL CHOLESTEROL, DIRECT: Direct LDL: 90.6 mg/dL

## 2013-11-18 ENCOUNTER — Other Ambulatory Visit: Payer: Self-pay | Admitting: Internal Medicine

## 2013-11-18 NOTE — Assessment & Plan Note (Signed)
Lab Results  Component Value Date   HGBA1C 6.2 11/16/2013   Check today and make decisions after results returned

## 2013-11-18 NOTE — Assessment & Plan Note (Signed)
Discussed need to resume his low calorie diet

## 2013-11-18 NOTE — Assessment & Plan Note (Signed)
BP Readings from Last 3 Encounters:  11/16/13 130/76  08/12/13 104/90  07/12/13 160/80   Fair control continue same meds

## 2013-11-23 ENCOUNTER — Ambulatory Visit: Payer: Medicare Other | Admitting: Internal Medicine

## 2013-11-24 DIAGNOSIS — M545 Low back pain, unspecified: Secondary | ICD-10-CM | POA: Diagnosis not present

## 2013-12-08 DIAGNOSIS — N139 Obstructive and reflux uropathy, unspecified: Secondary | ICD-10-CM | POA: Diagnosis not present

## 2013-12-08 DIAGNOSIS — R972 Elevated prostate specific antigen [PSA]: Secondary | ICD-10-CM | POA: Diagnosis not present

## 2013-12-08 DIAGNOSIS — N401 Enlarged prostate with lower urinary tract symptoms: Secondary | ICD-10-CM | POA: Diagnosis not present

## 2013-12-08 DIAGNOSIS — N138 Other obstructive and reflux uropathy: Secondary | ICD-10-CM | POA: Diagnosis not present

## 2013-12-08 DIAGNOSIS — C61 Malignant neoplasm of prostate: Secondary | ICD-10-CM | POA: Diagnosis not present

## 2013-12-10 ENCOUNTER — Institutional Professional Consult (permissible substitution): Payer: Medicare Other | Admitting: Urology

## 2013-12-13 ENCOUNTER — Other Ambulatory Visit: Payer: Self-pay | Admitting: Urology

## 2013-12-13 DIAGNOSIS — C61 Malignant neoplasm of prostate: Secondary | ICD-10-CM

## 2013-12-20 ENCOUNTER — Encounter (INDEPENDENT_AMBULATORY_CARE_PROVIDER_SITE_OTHER): Payer: Medicare Other | Admitting: Ophthalmology

## 2013-12-20 DIAGNOSIS — E1139 Type 2 diabetes mellitus with other diabetic ophthalmic complication: Secondary | ICD-10-CM | POA: Diagnosis not present

## 2013-12-20 DIAGNOSIS — I10 Essential (primary) hypertension: Secondary | ICD-10-CM

## 2013-12-20 DIAGNOSIS — H43819 Vitreous degeneration, unspecified eye: Secondary | ICD-10-CM

## 2013-12-20 DIAGNOSIS — H3530 Unspecified macular degeneration: Secondary | ICD-10-CM

## 2013-12-20 DIAGNOSIS — H35039 Hypertensive retinopathy, unspecified eye: Secondary | ICD-10-CM

## 2013-12-20 DIAGNOSIS — E11319 Type 2 diabetes mellitus with unspecified diabetic retinopathy without macular edema: Secondary | ICD-10-CM | POA: Diagnosis not present

## 2013-12-20 DIAGNOSIS — H2510 Age-related nuclear cataract, unspecified eye: Secondary | ICD-10-CM

## 2013-12-20 DIAGNOSIS — H353290 Exudative age-related macular degeneration, unspecified eye, stage unspecified: Secondary | ICD-10-CM

## 2013-12-20 DIAGNOSIS — E1165 Type 2 diabetes mellitus with hyperglycemia: Secondary | ICD-10-CM

## 2013-12-22 ENCOUNTER — Encounter: Payer: Self-pay | Admitting: Internal Medicine

## 2013-12-22 MED ORDER — TRAMADOL HCL 50 MG PO TABS: ORAL_TABLET | ORAL | Status: DC

## 2013-12-27 DIAGNOSIS — C61 Malignant neoplasm of prostate: Secondary | ICD-10-CM | POA: Diagnosis not present

## 2013-12-30 ENCOUNTER — Ambulatory Visit (HOSPITAL_COMMUNITY): Payer: Medicare Other

## 2013-12-30 ENCOUNTER — Encounter (HOSPITAL_COMMUNITY)
Admission: RE | Admit: 2013-12-30 | Discharge: 2013-12-30 | Disposition: A | Discharge status: Home / Self Care | Payer: Medicare Other | Source: Ambulatory Visit | Attending: Urology | Admitting: Urology

## 2013-12-30 ENCOUNTER — Encounter (HOSPITAL_COMMUNITY): Payer: Medicare Other

## 2013-12-30 DIAGNOSIS — S7290XD Unspecified fracture of unspecified femur, subsequent encounter for closed fracture with routine healing: Secondary | ICD-10-CM | POA: Insufficient documentation

## 2013-12-30 DIAGNOSIS — C61 Malignant neoplasm of prostate: Secondary | ICD-10-CM | POA: Diagnosis not present

## 2013-12-30 MED ORDER — TECHNETIUM TC 99M MEDRONATE IV KIT
25.0000 | PACK | Freq: Once | INTRAVENOUS | Status: AC | PRN
  Administered 2013-12-30: 25 via INTRAVENOUS

## 2014-01-02 DIAGNOSIS — C9590 Leukemia, unspecified not having achieved remission: Secondary | ICD-10-CM

## 2014-01-02 HISTORY — PX: PROSTATE SURGERY: SHX751

## 2014-01-02 HISTORY — DX: Leukemia, unspecified not having achieved remission: C95.90

## 2014-01-04 DIAGNOSIS — C61 Malignant neoplasm of prostate: Secondary | ICD-10-CM | POA: Diagnosis not present

## 2014-01-05 ENCOUNTER — Other Ambulatory Visit: Payer: Self-pay | Admitting: Urology

## 2014-01-05 DIAGNOSIS — L259 Unspecified contact dermatitis, unspecified cause: Secondary | ICD-10-CM | POA: Diagnosis not present

## 2014-01-05 DIAGNOSIS — L821 Other seborrheic keratosis: Secondary | ICD-10-CM | POA: Diagnosis not present

## 2014-01-07 ENCOUNTER — Encounter (HOSPITAL_COMMUNITY): Payer: Medicare Other

## 2014-01-10 ENCOUNTER — Encounter: Payer: Self-pay | Admitting: Internal Medicine

## 2014-01-11 ENCOUNTER — Encounter: Payer: Self-pay | Admitting: Internal Medicine

## 2014-01-11 MED ORDER — TRAMADOL HCL 50 MG PO TABS: ORAL_TABLET | ORAL | Status: DC

## 2014-01-12 DIAGNOSIS — C61 Malignant neoplasm of prostate: Secondary | ICD-10-CM | POA: Diagnosis not present

## 2014-01-12 DIAGNOSIS — R279 Unspecified lack of coordination: Secondary | ICD-10-CM | POA: Diagnosis not present

## 2014-01-12 DIAGNOSIS — M6281 Muscle weakness (generalized): Secondary | ICD-10-CM | POA: Diagnosis not present

## 2014-01-12 DIAGNOSIS — N393 Stress incontinence (female) (male): Secondary | ICD-10-CM | POA: Diagnosis not present

## 2014-01-13 ENCOUNTER — Encounter (HOSPITAL_COMMUNITY): Payer: Self-pay | Admitting: Pharmacy Technician

## 2014-01-13 ENCOUNTER — Encounter (HOSPITAL_COMMUNITY)
Admission: RE | Admit: 2014-01-13 | Discharge: 2014-01-13 | Disposition: A | Discharge status: Home / Self Care | Payer: Medicare Other | Source: Ambulatory Visit | Attending: Urology | Admitting: Urology

## 2014-01-13 ENCOUNTER — Encounter (HOSPITAL_COMMUNITY): Payer: Self-pay

## 2014-01-13 ENCOUNTER — Ambulatory Visit (HOSPITAL_COMMUNITY)
Admission: RE | Admit: 2014-01-13 | Discharge: 2014-01-13 | Disposition: A | Discharge status: Home / Self Care | Payer: Medicare Other | Source: Ambulatory Visit | Attending: Urology | Admitting: Urology

## 2014-01-13 DIAGNOSIS — Z87891 Personal history of nicotine dependence: Secondary | ICD-10-CM | POA: Diagnosis not present

## 2014-01-13 DIAGNOSIS — Z01810 Encounter for preprocedural cardiovascular examination: Secondary | ICD-10-CM | POA: Insufficient documentation

## 2014-01-13 DIAGNOSIS — Z01818 Encounter for other preprocedural examination: Secondary | ICD-10-CM | POA: Insufficient documentation

## 2014-01-13 DIAGNOSIS — Z01812 Encounter for preprocedural laboratory examination: Secondary | ICD-10-CM | POA: Diagnosis not present

## 2014-01-13 DIAGNOSIS — I10 Essential (primary) hypertension: Secondary | ICD-10-CM | POA: Insufficient documentation

## 2014-01-13 HISTORY — DX: Other intervertebral disc degeneration, lumbar region: M51.36

## 2014-01-13 HISTORY — DX: Other intervertebral disc degeneration, lumbar region without mention of lumbar back pain or lower extremity pain: M51.369

## 2014-01-13 HISTORY — DX: Nausea with vomiting, unspecified: Z98.890

## 2014-01-13 HISTORY — DX: Nausea with vomiting, unspecified: R11.2

## 2014-01-13 LAB — BASIC METABOLIC PANEL
BUN: 16 mg/dL (ref 6–23)
CO2: 27 mEq/L (ref 19–32)
Calcium: 9.8 mg/dL (ref 8.4–10.5)
Chloride: 100 mEq/L (ref 96–112)
Creatinine, Ser: 1.01 mg/dL (ref 0.50–1.35)
GFR calc Af Amer: 85 mL/min — ABNORMAL LOW (ref >90)
GFR calc non Af Amer: 73 mL/min — ABNORMAL LOW (ref >90)
Glucose, Bld: 190 mg/dL — ABNORMAL HIGH (ref 70–99)
Potassium: 4.4 mEq/L (ref 3.7–5.3)
Sodium: 142 mEq/L (ref 137–147)

## 2014-01-13 LAB — CBC
HCT: 46.5 % (ref 39.0–52.0)
Hemoglobin: 16.3 g/dL (ref 13.0–17.0)
MCH: 29.2 pg (ref 26.0–34.0)
MCHC: 35.1 g/dL (ref 30.0–36.0)
MCV: 83.2 fL (ref 78.0–100.0)
Platelets: 256 10*3/uL (ref 150–400)
RBC: 5.59 MIL/uL (ref 4.22–5.81)
RDW: 14.1 % (ref 11.5–15.5)
WBC: 13.6 10*3/uL — ABNORMAL HIGH (ref 4.0–10.5)

## 2014-01-13 NOTE — Patient Instructions (Addendum)
      Your procedure is scheduled on:  01/24/14  MONDAY  Report to [facility] at   0800    AM.  Call this number if you have problems the morning of surgery: [facility] AS PER OFFICE  TAKE ONE HALF DOSAGE INSULIN Sunday NIGHT-  Do not  Take ANYTHING BY MOUTH :After Midnight. Sunday NIGHT   Take these medicines the morning of surgery with A SIP OF WATER:Amlodipine, Levothyroxine                                            May take Tramadol if needed DO NOT TAKE ANY INSULIN Monday MORNING  .  Contacts, dentures or partial plates, or metal hairpins  can not be worn to surgery. Your family will be responsible for glasses, dentures, hearing aides while you are in surgery  Leave suitcase in the car. After surgery it may be brought to your room.  For patients admitted to the hospital, checkout time is 11:00 AM day of  discharge.                DO NOT WEAR JEWELRY, LOTIONS, POWDERS, OR PERFUMES.  WOMEN-- DO NOT SHAVE LEGS OR UNDERARMS FOR 48 HOURS BEFORE SHOWERS. MEN MAY SHAVE FACE.  Patients discharged the day of surgery will not be allowed to drive home. IF going home the day of surgery, you must have a driver and someone to stay with you for the first 24 hours  Name and phone number of your driver:    admission     Then wife                                                               Please read over the following fact sheets that you were given:Incentive Spirometry Sheet, Blood Transfusion Sheet  Information                     FAILURE TO [facility]                                                  Patient Signature _____________________________

## 2014-01-20 ENCOUNTER — Encounter: Payer: Self-pay | Admitting: Internal Medicine

## 2014-01-21 NOTE — H&P (Signed)
Chief Complaint Prostate Cancer   Reason For Visit Reason for consult: To discuss treatment options for prostate cancer and specifically to consider a robotic prostatectomy.  Physician requesting consult: Dr. Irine Seal  PCP: Dr. Phoebe Sharps   History of Present Illness [name] is a 61 year patient who has been followed for an elevated PSA by Dr. Irine Seal for the last 5-6 years. He also underwent a TURP in February 2009 for BPH and placement of an inflatable penile prosthesis for erectile dysfunction in June 2009. According to the operative notes, his IPP was placed with an infrapubic approach with a 3 piece prosthesis with the reservoir placed in the prevesical space via a midline incision in the rectus fascia. The pathology from his TURP was negative for malignancy. He was noted to have a rise in his PSA up to 6.8 which prompted a prostate biopsy on 12/08/13. 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 He has undergone staging studies including a CT scan of the pelvis (12/27/13) and a bone scan (12/30/13) which were negative for metastatic disease. He has no family history of prostate cancer.    His medical comorbidities include a history of diabetes, GERD, hypertension, hypothyroidism, sleep apnea, and macular degeneration.    TNM stage: cT1c N0 M0  PSA: 6.8  Gleason score: 4+4=8  Prostate biopsy (12/08/13): 7/16 cores positive   Left: L lateral base (50%, 4+4=8), L base (30%, 3+4=7, PNI / 60%, 3+4=7, PNI)   Right: R apex (20%, 3+3=6), R mid (10%, 3+3=6), R base (70%, 3+3=6, PNI / 40%, 3+3=6, PNI)  Prostate volume: 131 cc    Nomogram  OC disease: 30%  EPE: 68%  SVI: 22%  LNI: 22%  PFS (surgery): 43% at 7 years, 28% at 12 years    Urinary function: He has longstanding history of BPH and voiding symptoms s/p TURP. His symptoms were initially improved after TURP but had significantly worsened. Currently, he  has very bothersome lower urinary tract symptoms. IPSS is 28.  Erectile function: He is s/p placement of an IPP in 2009. His prosthesis is still functional although he rarely uses it. He informed me today that he would not be terribly upset if we did have to remove his prosthesis in the future consider an erectile function as a low priority to him at this point.   Past Medical History Problems  1. History of Arthritis (V13.4) 2. History of Bulging Lumbar Disc (722.10) 3. History of depression (V11.8) 4. History of diabetes mellitus (V12.29) 5. History of esophageal reflux (V12.79) 6. History of hypertension (V12.59) 7. History of hypothyroidism (V12.29) 8. History of sleep apnea (V13.89) 9. History of Macular degeneration of right eye (362.50)  Surgical History Problems  1. History of Cholecystectomy Laparoscopic 2. History of Foot Surgery 3. History of Laparosc Gastric Restrictive Proc By Adjustable Gastric Band 4. History of Leg Repair 5. History of Shoulder Arthroscopy With Rotator Cuff Repair 6. History of Surg Penis Insertion Of Penile Prosthesis 7. History of Tonsillectomy 8. History of Transurethral Resection Of Prostate (TURP)  Current Meds 1. ALPRAZolam 0.5 MG Oral Tablet; TAKE 1 TABLET 3 times daily PRN anxiety;  Therapy: 23Jan2015 to (Last Rx:23Jan2015) Ordered 2. Finasteride 5 MG Oral Tablet; TAKE 1 TABLET DAILY AS DIRECTED;  Therapy: [id number] to (Evaluate:02Jan2016)  Requested for: [id number]; Last  Rx:07Jan2015 Ordered 3. Insulin;  Therapy: (Recorded:31Jul2013) to Recorded 4. Levothyroxine Sodium TABS;  Therapy: (Recorded:25Mar2008) to Recorded 5. Norvasc  5 MG Oral Tablet;  Therapy: (Recorded:06Nov2012) to Recorded 6. TraMADol HCl - 50 MG Oral Tablet;  Therapy: (Recorded:01Jul2014) to Recorded  Allergies Medication  1. No Known Drug Allergies  Family History Problems  1. Denied: Family history of prostate cancer 2. Family history of Heart Disease  (V17.49) : Father  Social History Problems    Denied: Alcohol Use   Former Smoker   Marital History - Currently Married   Occupation:   retired  Review of Systems Constitutional, skin, eye, otolaryngeal, hematologic/lymphatic, cardiovascular, pulmonary, endocrine, musculoskeletal, gastrointestinal, neurological and psychiatric system(s) were reviewed and pertinent findings if present are noted.  Constitutional: no fever and no night sweats.  Cardiovascular: no chest pain and no leg swelling.  Respiratory: no shortness of breath, no cough and no shortness of breath during exertion.  Psychiatric: anxiety.    Vitals Vital Signs [Data Includes: Last 1 Day]  Recorded: 03Feb2015 07:57AM  Height: 6 ft  Weight: 229 lb  BMI Calculated: 31.06 BSA Calculated: 2.26 Blood Pressure: 164 / 95 Heart Rate: 101  Physical Exam Constitutional: Well nourished and well developed . No acute distress.  ENT:. The ears and nose are normal in appearance.  Neck: The appearance of the neck is normal and no neck mass is present.  Pulmonary: No respiratory distress, normal respiratory rhythm and effort and clear bilateral breath sounds.  Cardiovascular: Heart rate and rhythm are normal . No peripheral edema.  Abdomen: The abdomen is mildly obese. The abdomen is soft and nontender. No masses are palpated. No CVA tenderness. No hernias are palpable. No hepatosplenomegaly noted. He has well healed laparoscopic incisions over the upper abdomen from his prior lap band and cholecystectomy.  Rectal: Rectal exam demonstrates normal sphincter tone and no masses. Prostate size is estimated to be > 100 g. The prostate has no nodularity and is not indurated.  Genitourinary: He has a functional penile prosthesis which was cycled today. His pump is noted in the right hemiscrotum. The testes are descended bilaterally and are nontender without masses. He has no penile abnormalities. Meatus is patent.  Lymphatics: The  femoral and inguinal nodes are not enlarged or tender.  Skin: Normal skin turgor, no visible rash and no visible skin lesions.  Neuro/Psych:. Mood and affect are appropriate.    Results/Data Urine [Data Includes: Last 1 Day]   [id number]  COLOR YELLOW   APPEARANCE CLEAR   SPECIFIC GRAVITY 1.010   pH 5.5   GLUCOSE NEG mg/dL  BILIRUBIN NEG   KETONE NEG mg/dL  BLOOD TRACE   PROTEIN NEG mg/dL  UROBILINOGEN 0.2 mg/dL  NITRITE NEG   LEUKOCYTE ESTERASE NEG   SQUAMOUS EPITHELIAL/HPF RARE   WBC 0-2 WBC/hpf  RBC 0-2 RBC/hpf  BACTERIA NONE SEEN   CRYSTALS NONE SEEN   CASTS NONE SEEN    I have independently reviewed the patient's medical records, PSA results, pathology report, CT scan, and bone scan. Findings are as dictated above.   Assessment Assessed  1. Prostate cancer (185) 2. History of Cholecystectomy Laparoscopic  Plan Health Maintenance  1. UA With REFLEX; [Do Not Release]; Status:Complete;   DoneLY:[id number] 07:43AM Prostate cancer  2. Follow-up Schedule Surgery Office  Follow-up  Status: Hold For - Appointment   Requested for: [phone] 3. PT/OT Referral Referral  Referral  Status: Hold For - Appointment,PreCert,Date of  Service,Physical Therapy  Requested for: [id number]  Discussion/Summary 1. Prostate cancer: I had a detailed discussion with [name] and his wife today regarding his prostate cancer diagnosis and  options for management.   The patient was counseled about the natural history of prostate cancer and the standard treatment options that are available for prostate cancer. It was explained to him how his age and life expectancy, clinical stage, Gleason score, and PSA affect his prognosis, the decision to proceed with additional staging studies, as well as how that information influences recommended treatment strategies. We discussed the roles for active surveillance, radiation therapy, surgical therapy, androgen deprivation, as well as ablative therapy options for  the treatment of prostate cancer as appropriate to his individual cancer situation. We discussed the risks and benefits of these options with regard to their impact on cancer control and also in terms of potential adverse events, complications, and impact on quiality of life particularly related to urinary, bowel, and sexual function. The patient was encouraged to ask questions throughout the discussion today and all questions were answered to his stated satisfaction. In addition, the patient was provided with and/or directed to appropriate resources and literature for further education about prostate cancer and treatment options.   We discussed surgical therapy for prostate cancer including the different available surgical approaches. We discussed, in detail, the risks and expectations of surgery with regard to cancer control, urinary control, and erectile function as well as the expected postoperative recovery process. Additional risks of surgery including but not limited to bleeding, infection, hernia formation, nerve damage, lymphocele formation, bowel/rectal injury potentially necessitating colostomy, damage to the urinary tract resulting in urine leakage, urethral stricture, and the cardiopulmonary risks such as myocardial infarction, stroke, death, venothromboembolism, etc. were explained. The risk of open surgical conversion for robotic/laparoscopic prostatectomy was also discussed.     Considering his very bothersome voiding symptoms, he does adamantly wish to proceed with surgical therapy although we also discussed the alternative option of external beam radiation therapy in combination with androgen deprivation treatment.     We discussed the complexity of surgery in this situation considering his prior TURP, enlarged prostate gland, and penile prosthesis reservoir. We discuss the potential risk of open surgical conversion in detail as well as the possibility of injury/damage to his reservoir  which could result in malfunction or subsequent removal of his prosthesis. We also reviewed the high risk nature of his disease and the potential need for additional adjuvant or salvage therapy in the future following surgical treatment. He feels very well informed and his questions were answered to his stated satisfaction. He does wish to proceed with surgical therapy and will be scheduled for a non-nerve sparing robotic prostatectomy and bilateral pelvic lymphadenectomy.    Cc: Dr. Irine Seal  Dr. Phoebe Sharps     A total of 85 minutes were spent in the overall care of the patient today with 55 minutes in direct face to face consultation.    Signatures Electronically signed by : Raynelle Bring, M.D.; Jan 04 2014  9:16AM EST

## 2014-01-23 MED ORDER — GENTAMICIN SULFATE 40 MG/ML IJ SOLN
440.0000 mg | INTRAMUSCULAR | Status: AC
  Administered 2014-01-24: 440 mg via INTRAVENOUS
  Filled 2014-01-23: qty 11

## 2014-01-23 MED ORDER — SODIUM CHLORIDE 0.9 % IV SOLN
1500.0000 mg | INTRAVENOUS | Status: AC
  Administered 2014-01-24: 1500 mg via INTRAVENOUS
  Filled 2014-01-23: qty 1500

## 2014-01-24 ENCOUNTER — Inpatient Hospital Stay (HOSPITAL_COMMUNITY): Payer: Medicare Other | Admitting: Anesthesiology

## 2014-01-24 ENCOUNTER — Encounter (HOSPITAL_COMMUNITY)
Admission: RE | Disposition: A | Discharge status: Home / Self Care | Payer: Self-pay | Source: Ambulatory Visit | Attending: Urology

## 2014-01-24 ENCOUNTER — Encounter (HOSPITAL_COMMUNITY): Payer: Medicare Other | Admitting: Anesthesiology

## 2014-01-24 ENCOUNTER — Inpatient Hospital Stay (HOSPITAL_COMMUNITY)
Admission: RE | Admit: 2014-01-24 | Discharge: 2014-01-25 | DRG: 708 | Disposition: A | Discharge status: Home / Self Care | Payer: Medicare Other | Source: Ambulatory Visit | Attending: Urology | Admitting: Urology

## 2014-01-24 ENCOUNTER — Encounter (HOSPITAL_COMMUNITY): Payer: Self-pay | Admitting: *Deleted

## 2014-01-24 DIAGNOSIS — K219 Gastro-esophageal reflux disease without esophagitis: Secondary | ICD-10-CM | POA: Diagnosis present

## 2014-01-24 DIAGNOSIS — Z79899 Other long term (current) drug therapy: Secondary | ICD-10-CM

## 2014-01-24 DIAGNOSIS — Z794 Long term (current) use of insulin: Secondary | ICD-10-CM

## 2014-01-24 DIAGNOSIS — E119 Type 2 diabetes mellitus without complications: Secondary | ICD-10-CM | POA: Diagnosis present

## 2014-01-24 DIAGNOSIS — C61 Malignant neoplasm of prostate: Principal | ICD-10-CM | POA: Diagnosis present

## 2014-01-24 DIAGNOSIS — F411 Generalized anxiety disorder: Secondary | ICD-10-CM | POA: Diagnosis present

## 2014-01-24 DIAGNOSIS — C8586 Other specified types of non-Hodgkin lymphoma, intrapelvic lymph nodes: Secondary | ICD-10-CM | POA: Diagnosis not present

## 2014-01-24 DIAGNOSIS — H3530 Unspecified macular degeneration: Secondary | ICD-10-CM | POA: Diagnosis present

## 2014-01-24 DIAGNOSIS — C775 Secondary and unspecified malignant neoplasm of intrapelvic lymph nodes: Secondary | ICD-10-CM | POA: Diagnosis not present

## 2014-01-24 DIAGNOSIS — Z87891 Personal history of nicotine dependence: Secondary | ICD-10-CM | POA: Diagnosis not present

## 2014-01-24 DIAGNOSIS — I10 Essential (primary) hypertension: Secondary | ICD-10-CM | POA: Diagnosis present

## 2014-01-24 DIAGNOSIS — D126 Benign neoplasm of colon, unspecified: Secondary | ICD-10-CM | POA: Diagnosis not present

## 2014-01-24 DIAGNOSIS — Z8546 Personal history of malignant neoplasm of prostate: Secondary | ICD-10-CM

## 2014-01-24 DIAGNOSIS — M5137 Other intervertebral disc degeneration, lumbosacral region: Secondary | ICD-10-CM | POA: Diagnosis not present

## 2014-01-24 DIAGNOSIS — G4730 Sleep apnea, unspecified: Secondary | ICD-10-CM | POA: Diagnosis present

## 2014-01-24 DIAGNOSIS — Z8249 Family history of ischemic heart disease and other diseases of the circulatory system: Secondary | ICD-10-CM

## 2014-01-24 DIAGNOSIS — E039 Hypothyroidism, unspecified: Secondary | ICD-10-CM | POA: Diagnosis present

## 2014-01-24 DIAGNOSIS — C9110 Chronic lymphocytic leukemia of B-cell type not having achieved remission: Secondary | ICD-10-CM | POA: Diagnosis not present

## 2014-01-24 HISTORY — PX: ROBOT ASSISTED LAPAROSCOPIC RADICAL PROSTATECTOMY: SHX5141

## 2014-01-24 HISTORY — DX: Personal history of malignant neoplasm of prostate: Z85.46

## 2014-01-24 HISTORY — PX: LYMPHADENECTOMY: SHX5960

## 2014-01-24 LAB — ABO/RH: ABO/RH(D): A POS

## 2014-01-24 LAB — TYPE AND SCREEN
ABO/RH(D): A POS
Antibody Screen: NEGATIVE

## 2014-01-24 LAB — GLUCOSE, CAPILLARY
Glucose-Capillary: 155 mg/dL — ABNORMAL HIGH (ref 70–99)
Glucose-Capillary: 172 mg/dL — ABNORMAL HIGH (ref 70–99)

## 2014-01-24 LAB — HEMOGLOBIN AND HEMATOCRIT, BLOOD
HCT: 40.7 % (ref 39.0–52.0)
Hemoglobin: 13.8 g/dL (ref 13.0–17.0)

## 2014-01-24 SURGERY — ROBOTIC ASSISTED LAPAROSCOPIC RADICAL PROSTATECTOMY LEVEL 3
Anesthesia: General

## 2014-01-24 MED ORDER — GLYCOPYRROLATE 0.2 MG/ML IJ SOLN
INTRAMUSCULAR | Status: DC | PRN
  Administered 2014-01-24: .6 mg via INTRAVENOUS

## 2014-01-24 MED ORDER — PROMETHAZINE HCL 25 MG/ML IJ SOLN
6.2500 mg | INTRAMUSCULAR | Status: DC | PRN
  Administered 2014-01-24: 6.25 mg via INTRAVENOUS

## 2014-01-24 MED ORDER — HYDROCODONE-ACETAMINOPHEN 5-325 MG PO TABS: 1.0000 | ORAL_TABLET | Freq: Four times a day (QID) | ORAL | Status: DC | PRN

## 2014-01-24 MED ORDER — ONDANSETRON HCL 4 MG/2ML IJ SOLN
4.0000 mg | INTRAMUSCULAR | Status: DC | PRN
  Administered 2014-01-24: 4 mg via INTRAVENOUS
  Filled 2014-01-24: qty 2

## 2014-01-24 MED ORDER — SODIUM CHLORIDE 0.9 % IJ SOLN
INTRAMUSCULAR | Status: AC
  Filled 2014-01-24: qty 10

## 2014-01-24 MED ORDER — SODIUM CHLORIDE 0.9 % IV SOLN
INTRAVENOUS | Status: DC | PRN
  Administered 2014-01-24: 11:00:00 via INTRAVENOUS

## 2014-01-24 MED ORDER — DOCUSATE SODIUM 100 MG PO CAPS
100.0000 mg | ORAL_CAPSULE | Freq: Two times a day (BID) | ORAL | Status: DC
  Administered 2014-01-24 – 2014-01-25 (×2): 100 mg via ORAL
  Filled 2014-01-24 (×3): qty 1

## 2014-01-24 MED ORDER — HYDROMORPHONE HCL PF 1 MG/ML IJ SOLN
INTRAMUSCULAR | Status: DC | PRN
  Administered 2014-01-24: .2 mg via INTRAVENOUS
  Administered 2014-01-24: .4 mg via INTRAVENOUS
  Administered 2014-01-24: .6 mg via INTRAVENOUS
  Administered 2014-01-24 (×2): .4 mg via INTRAVENOUS

## 2014-01-24 MED ORDER — LACTATED RINGERS IV SOLN
INTRAVENOUS | Status: DC | PRN
  Administered 2014-01-24: 12:00:00

## 2014-01-24 MED ORDER — LIDOCAINE HCL (CARDIAC) 20 MG/ML IV SOLN
INTRAVENOUS | Status: DC | PRN
  Administered 2014-01-24: 100 mg via INTRAVENOUS

## 2014-01-24 MED ORDER — ACETAMINOPHEN 10 MG/ML IV SOLN
1000.0000 mg | Freq: Once | INTRAVENOUS | Status: AC
  Administered 2014-01-24: 1000 mg via INTRAVENOUS
  Filled 2014-01-24: qty 100

## 2014-01-24 MED ORDER — HEPARIN SODIUM (PORCINE) 1000 UNIT/ML IJ SOLN
INTRAMUSCULAR | Status: AC
  Filled 2014-01-24: qty 1

## 2014-01-24 MED ORDER — EPHEDRINE SULFATE 50 MG/ML IJ SOLN
INTRAMUSCULAR | Status: DC | PRN
  Administered 2014-01-24: 10 mg via INTRAVENOUS

## 2014-01-24 MED ORDER — NEOSTIGMINE METHYLSULFATE 1 MG/ML IJ SOLN
INTRAMUSCULAR | Status: AC
  Filled 2014-01-24: qty 10

## 2014-01-24 MED ORDER — SODIUM CHLORIDE 0.9 % IV BOLUS (SEPSIS)
1000.0000 mL | Freq: Once | INTRAVENOUS | Status: AC
  Administered 2014-01-24: 1000 mL via INTRAVENOUS

## 2014-01-24 MED ORDER — ACETAMINOPHEN 325 MG PO TABS: 650.0000 mg | ORAL_TABLET | ORAL | Status: DC | PRN

## 2014-01-24 MED ORDER — HYDROMORPHONE HCL PF 1 MG/ML IJ SOLN
INTRAMUSCULAR | Status: AC
  Filled 2014-01-24: qty 1

## 2014-01-24 MED ORDER — PROPOFOL 10 MG/ML IV BOLUS
INTRAVENOUS | Status: DC | PRN
  Administered 2014-01-24: 160 mg via INTRAVENOUS

## 2014-01-24 MED ORDER — ONDANSETRON HCL 4 MG/2ML IJ SOLN
INTRAMUSCULAR | Status: AC
  Filled 2014-01-24: qty 2

## 2014-01-24 MED ORDER — MIRTAZAPINE 15 MG PO TABS
15.0000 mg | ORAL_TABLET | Freq: Every day | ORAL | Status: DC
  Administered 2014-01-24: 15 mg via ORAL
  Filled 2014-01-24 (×2): qty 1

## 2014-01-24 MED ORDER — SUFENTANIL CITRATE 50 MCG/ML IV SOLN
INTRAVENOUS | Status: DC | PRN
  Administered 2014-01-24: 20 ug via INTRAVENOUS
  Administered 2014-01-24 (×2): 5 ug via INTRAVENOUS
  Administered 2014-01-24: 10 ug via INTRAVENOUS
  Administered 2014-01-24 (×2): 5 ug via INTRAVENOUS

## 2014-01-24 MED ORDER — INSULIN ASPART 100 UNIT/ML ~~LOC~~ SOLN
0.0000 [IU] | SUBCUTANEOUS | Status: DC
  Administered 2014-01-24: 3 [IU] via SUBCUTANEOUS
  Administered 2014-01-25: 2 [IU] via SUBCUTANEOUS
  Administered 2014-01-25 (×3): 3 [IU] via SUBCUTANEOUS

## 2014-01-24 MED ORDER — ATORVASTATIN CALCIUM 40 MG PO TABS
40.0000 mg | ORAL_TABLET | Freq: Every day | ORAL | Status: DC
  Filled 2014-01-24 (×2): qty 1

## 2014-01-24 MED ORDER — LIDOCAINE HCL (CARDIAC) 20 MG/ML IV SOLN
INTRAVENOUS | Status: AC
  Filled 2014-01-24: qty 5

## 2014-01-24 MED ORDER — DEXAMETHASONE SODIUM PHOSPHATE 10 MG/ML IJ SOLN
INTRAMUSCULAR | Status: AC
  Filled 2014-01-24: qty 1

## 2014-01-24 MED ORDER — GLYCOPYRROLATE 0.2 MG/ML IJ SOLN
INTRAMUSCULAR | Status: AC
  Filled 2014-01-24: qty 3

## 2014-01-24 MED ORDER — CISATRACURIUM BESYLATE 20 MG/10ML IV SOLN
INTRAVENOUS | Status: AC
  Filled 2014-01-24: qty 10

## 2014-01-24 MED ORDER — ONDANSETRON HCL 4 MG/2ML IJ SOLN
INTRAMUSCULAR | Status: DC | PRN
  Administered 2014-01-24: 4 mg via INTRAVENOUS

## 2014-01-24 MED ORDER — AMLODIPINE BESYLATE 10 MG PO TABS
10.0000 mg | ORAL_TABLET | Freq: Every morning | ORAL | Status: DC
  Administered 2014-01-25: 10 mg via ORAL
  Filled 2014-01-24: qty 1

## 2014-01-24 MED ORDER — CIPROFLOXACIN HCL 500 MG PO TABS: 500.0000 mg | ORAL_TABLET | Freq: Two times a day (BID) | ORAL | Status: DC

## 2014-01-24 MED ORDER — HYDROMORPHONE HCL PF 1 MG/ML IJ SOLN
0.2500 mg | INTRAMUSCULAR | Status: DC | PRN
  Administered 2014-01-24 (×2): 0.5 mg via INTRAVENOUS

## 2014-01-24 MED ORDER — PROPOFOL 10 MG/ML IV BOLUS
INTRAVENOUS | Status: AC
  Filled 2014-01-24: qty 20

## 2014-01-24 MED ORDER — PROMETHAZINE HCL 25 MG/ML IJ SOLN
INTRAMUSCULAR | Status: AC
  Filled 2014-01-24: qty 1

## 2014-01-24 MED ORDER — LEVOTHYROXINE SODIUM 50 MCG PO TABS
50.0000 ug | ORAL_TABLET | Freq: Every day | ORAL | Status: DC
  Administered 2014-01-25: 50 ug via ORAL
  Filled 2014-01-24 (×2): qty 1

## 2014-01-24 MED ORDER — EPHEDRINE SULFATE 50 MG/ML IJ SOLN
INTRAMUSCULAR | Status: AC
  Filled 2014-01-24: qty 1

## 2014-01-24 MED ORDER — CISATRACURIUM BESYLATE (PF) 10 MG/5ML IV SOLN
INTRAVENOUS | Status: DC | PRN
  Administered 2014-01-24: 2 mg via INTRAVENOUS
  Administered 2014-01-24: 6 mg via INTRAVENOUS
  Administered 2014-01-24: 4 mg via INTRAVENOUS
  Administered 2014-01-24: 10 mg via INTRAVENOUS
  Administered 2014-01-24: 2 mg via INTRAVENOUS

## 2014-01-24 MED ORDER — NEOSTIGMINE METHYLSULFATE 1 MG/ML IJ SOLN
INTRAMUSCULAR | Status: DC | PRN
  Administered 2014-01-24: 4 mg via INTRAVENOUS

## 2014-01-24 MED ORDER — BUPIVACAINE-EPINEPHRINE PF 0.25-1:200000 % IJ SOLN
INTRAMUSCULAR | Status: AC
  Filled 2014-01-24: qty 30

## 2014-01-24 MED ORDER — DIPHENHYDRAMINE HCL 12.5 MG/5ML PO ELIX: 12.5000 mg | ORAL_SOLUTION | Freq: Four times a day (QID) | ORAL | Status: DC | PRN

## 2014-01-24 MED ORDER — SODIUM CHLORIDE 0.9 % IR SOLN
Status: DC | PRN
  Administered 2014-01-24: 300 mL

## 2014-01-24 MED ORDER — POTASSIUM CHLORIDE IN NACL 20-0.45 MEQ/L-% IV SOLN
INTRAVENOUS | Status: DC
  Administered 2014-01-24 (×2): via INTRAVENOUS
  Filled 2014-01-24 (×9): qty 1000

## 2014-01-24 MED ORDER — SUCCINYLCHOLINE CHLORIDE 20 MG/ML IJ SOLN
INTRAMUSCULAR | Status: DC | PRN
  Administered 2014-01-24: 100 mg via INTRAVENOUS

## 2014-01-24 MED ORDER — VANCOMYCIN HCL IN DEXTROSE 1-5 GM/200ML-% IV SOLN
1000.0000 mg | Freq: Two times a day (BID) | INTRAVENOUS | Status: DC
  Filled 2014-01-24: qty 200

## 2014-01-24 MED ORDER — MEPERIDINE HCL 50 MG/ML IJ SOLN: 6.2500 mg | INTRAMUSCULAR | Status: DC | PRN

## 2014-01-24 MED ORDER — SUFENTANIL CITRATE 50 MCG/ML IV SOLN
INTRAVENOUS | Status: AC
  Filled 2014-01-24: qty 1

## 2014-01-24 MED ORDER — DIPHENHYDRAMINE HCL 50 MG/ML IJ SOLN: 12.5000 mg | Freq: Four times a day (QID) | INTRAMUSCULAR | Status: DC | PRN

## 2014-01-24 MED ORDER — VANCOMYCIN HCL 1000 MG IV SOLR
1000.0000 mg | Freq: Once | INTRAVENOUS | Status: AC
  Administered 2014-01-24: 1000 mg via INTRAVENOUS
  Filled 2014-01-24: qty 1000

## 2014-01-24 MED ORDER — LACTATED RINGERS IV SOLN
INTRAVENOUS | Status: DC
  Administered 2014-01-24: 1000 mL via INTRAVENOUS
  Administered 2014-01-24: 14:00:00 via INTRAVENOUS

## 2014-01-24 MED ORDER — STERILE WATER FOR IRRIGATION IR SOLN
Status: DC | PRN
  Administered 2014-01-24: 1500 mL

## 2014-01-24 MED ORDER — HYDROMORPHONE HCL PF 1 MG/ML IJ SOLN
0.5000 mg | INTRAMUSCULAR | Status: DC | PRN
  Administered 2014-01-24 – 2014-01-25 (×3): 1 mg via INTRAVENOUS
  Filled 2014-01-24 (×3): qty 1

## 2014-01-24 MED ORDER — HYDROMORPHONE HCL PF 2 MG/ML IJ SOLN
INTRAMUSCULAR | Status: AC
  Filled 2014-01-24: qty 1

## 2014-01-24 MED ORDER — KETOROLAC TROMETHAMINE 15 MG/ML IJ SOLN
15.0000 mg | Freq: Four times a day (QID) | INTRAMUSCULAR | Status: DC
  Administered 2014-01-24 – 2014-01-25 (×4): 15 mg via INTRAVENOUS
  Filled 2014-01-24 (×6): qty 1

## 2014-01-24 MED ORDER — BUPIVACAINE-EPINEPHRINE 0.25% -1:200000 IJ SOLN
INTRAMUSCULAR | Status: DC | PRN
  Administered 2014-01-24: 30 mL

## 2014-01-24 SURGICAL SUPPLY — 47 items
CABLE HIGH FREQUENCY MONO STRZ (ELECTRODE) ×3 IMPLANT
CANISTER SUCTION 2500CC (MISCELLANEOUS) IMPLANT
CATH FOLEY 2WAY SLVR 18FR 30CC (CATHETERS) ×3 IMPLANT
CATH ROBINSON RED A/P 16FR (CATHETERS) ×3 IMPLANT
CATH ROBINSON RED A/P 8FR (CATHETERS) ×3 IMPLANT
CATH TIEMANN FOLEY 18FR 5CC (CATHETERS) ×3 IMPLANT
CHLORAPREP W/TINT 26ML (MISCELLANEOUS) ×3 IMPLANT
CLIP LIGATING HEM O LOK PURPLE (MISCELLANEOUS) ×6 IMPLANT
CLOTH BEACON ORANGE TIMEOUT ST (SAFETY) ×3 IMPLANT
COVER SURGICAL LIGHT HANDLE (MISCELLANEOUS) ×3 IMPLANT
COVER TIP SHEARS 8 DVNC (MISCELLANEOUS) ×2 IMPLANT
COVER TIP SHEARS 8MM DA VINCI (MISCELLANEOUS) ×1
CUTTER ECHEON FLEX ENDO 45 340 (ENDOMECHANICALS) ×3 IMPLANT
DECANTER SPIKE VIAL GLASS SM (MISCELLANEOUS) IMPLANT
DERMABOND ADVANCED (GAUZE/BANDAGES/DRESSINGS) ×3
DERMABOND ADVANCED .7 DNX12 (GAUZE/BANDAGES/DRESSINGS) ×6 IMPLANT
DRAPE SURG IRRIG POUCH 19X23 (DRAPES) ×3 IMPLANT
DRSG TEGADERM 4X4.75 (GAUZE/BANDAGES/DRESSINGS) ×3 IMPLANT
DRSG TEGADERM 6X8 (GAUZE/BANDAGES/DRESSINGS) ×6 IMPLANT
ELECT REM PT RETURN 9FT ADLT (ELECTROSURGICAL) ×3
ELECTRODE REM PT RTRN 9FT ADLT (ELECTROSURGICAL) ×2 IMPLANT
GLOVE BIO SURGEON STRL SZ 6.5 (GLOVE) ×3 IMPLANT
GLOVE BIOGEL M STRL SZ7.5 (GLOVE) ×6 IMPLANT
GOWN STRL REUS W/ TWL LRG LVL4 (GOWN DISPOSABLE) ×2 IMPLANT
GOWN STRL REUS W/TWL LRG LVL3 (GOWN DISPOSABLE) ×12 IMPLANT
GOWN STRL REUS W/TWL LRG LVL4 (GOWN DISPOSABLE) ×1
GOWN STRL REUS W/TWL XL LVL3 (GOWN DISPOSABLE) IMPLANT
HOLDER FOLEY CATH W/STRAP (MISCELLANEOUS) ×3 IMPLANT
IV LACTATED RINGERS 1000ML (IV SOLUTION) IMPLANT
KIT ACCESSORY DA VINCI DISP (KITS) ×1
KIT ACCESSORY DVNC DISP (KITS) ×2 IMPLANT
MANIFOLD NEPTUNE II (INSTRUMENTS) ×3 IMPLANT
NDL SAFETY ECLIPSE 18X1.5 (NEEDLE) ×2 IMPLANT
NEEDLE HYPO 18GX1.5 SHARP (NEEDLE) ×1
PACK ROBOT UROLOGY CUSTOM (CUSTOM PROCEDURE TRAY) ×3 IMPLANT
RELOAD GREEN ECHELON 45 (STAPLE) ×3 IMPLANT
SET TUBE IRRIG SUCTION NO TIP (IRRIGATION / IRRIGATOR) ×3 IMPLANT
SOLUTION ELECTROLUBE (MISCELLANEOUS) ×3 IMPLANT
SUT ETHILON 3 0 PS 1 (SUTURE) ×3 IMPLANT
SUT MNCRL AB 4-0 PS2 18 (SUTURE) ×6 IMPLANT
SUT VIC AB 3-0 SH 27 (SUTURE) ×2
SUT VIC AB 3-0 SH 27X BRD (SUTURE) ×4 IMPLANT
SUT VICRYL 0 UR6 27IN ABS (SUTURE) ×6 IMPLANT
SYR 27GX1/2 1ML LL SAFETY (SYRINGE) ×3 IMPLANT
TOWEL OR 17X26 10 PK STRL BLUE (TOWEL DISPOSABLE) ×3 IMPLANT
TOWEL OR NON WOVEN STRL DISP B (DISPOSABLE) ×3 IMPLANT
WATER STERILE IRR 1500ML POUR (IV SOLUTION) IMPLANT

## 2014-01-24 NOTE — Interval H&P Note (Signed)
History and Physical Interval Note:  01/24/2014 9:54 AM  [name]  has presented today for surgery, with the diagnosis of PROSTATE CANCER  The various methods of treatment have been discussed with the patient and family. After consideration of risks, benefits and other options for treatment, the patient has consented to  Procedure(s): ROBOTIC ASSISTED LAPAROSCOPIC RADICAL PROSTATECTOMY LEVEL 3 (N/A) LYMPHADENECTOMY (Bilateral) as a surgical intervention .  The patient's history has been reviewed, patient examined, no change in status, stable for surgery.  I have reviewed the patient's chart and labs.  Questions were answered to the patient's satisfaction.     [name],LES

## 2014-01-24 NOTE — Progress Notes (Signed)
Patient refuses CPAP at this time. Patient will call if he changes his mind. RN aware.

## 2014-01-24 NOTE — Progress Notes (Signed)
Patient ID: [name], male   DOB: 10/06/1941, 73 y.o.   MRN: [id number]  Post-op note  Subjective: The patient is doing well.  No complaints.  Objective: Vital signs in last 24 hours: Temp:  [97.7 F (36.5 C)-98.7 F (37.1 C)] 97.7 F (36.5 C) (02/23 1730) Pulse Rate:  [106-118] 117 (02/23 1730) Resp:  [12-18] 16 (02/23 1730) BP: (147-189)/(74-95) 177/88 mmHg (02/23 1730) SpO2:  [92 %-98 %] 96 % (02/23 1730)  Intake/Output from previous day:   Intake/Output this shift:    Physical Exam:  General: Alert and oriented. Abdomen: Soft, Nondistended. Incisions: Clean and dry.  Lab Results:  Recent Labs  01/24/14 1625  HGB 13.8  HCT 40.7    Assessment/Plan: POD#0   1) Continue to monitor 2) Ambulate, IS   [name]   LOS: 0 days   [name],LES 01/24/2014, 8:09 PM

## 2014-01-24 NOTE — Transfer of Care (Signed)
Immediate Anesthesia Transfer of Care Note  Patient: [name]  Procedure(s) Performed: Procedure(s): ROBOTIC ASSISTED LAPAROSCOPIC RADICAL PROSTATECTOMY LEVEL 3 (N/A) LYMPHADENECTOMY (Bilateral)  Patient Location: PACU  Anesthesia Type:General  Level of Consciousness: sedated  Airway & Oxygen Therapy: Patient Spontanous Breathing and Patient connected to face mask oxygen  Post-op Assessment: Report given to PACU RN and Post -op Vital signs reviewed and stable  Post vital signs: Reviewed and stable  Complications: No apparent anesthesia complications

## 2014-01-24 NOTE — Progress Notes (Signed)
Dr. Lissa Hoard made aware of patient's heart rates- O.K. To go to floor

## 2014-01-24 NOTE — Progress Notes (Signed)
Hgb. 13.8- Hct. 40.7- results noted- was drawn in PACU

## 2014-01-24 NOTE — Anesthesia Postprocedure Evaluation (Signed)
Anesthesia Post Note  Patient: [name]  Procedure(s) Performed: Procedure(s) (LRB): ROBOTIC ASSISTED LAPAROSCOPIC RADICAL PROSTATECTOMY LEVEL 3 (N/A) LYMPHADENECTOMY (Bilateral)  Anesthesia type: General  Patient location: PACU  Post pain: Pain level controlled  Post assessment: Post-op Vital signs reviewed  Last Vitals: BP 137/74  Pulse 100  Temp(Src) 37.1 C (Oral)  Resp 24  SpO2 97%  Post vital signs: Reviewed  Level of consciousness: sedated  Complications: No apparent anesthesia complications

## 2014-01-24 NOTE — Anesthesia Preprocedure Evaluation (Signed)
Anesthesia Evaluation  Patient identified by MRN, date of birth, ID band Patient awake    Reviewed: Allergy & Precautions, H&P , NPO status , Patient's Chart, lab work & pertinent test results  History of Anesthesia Complications (+) PONV and history of anesthetic complications  Airway Mallampati: II TM Distance: >3 FB Neck ROM: Full    Dental  (+) Dental Advisory Given   Pulmonary sleep apnea , former smoker,  breath sounds clear to auscultation        Cardiovascular hypertension, Pt. on medications Rhythm:Regular Rate:Normal     Neuro/Psych PSYCHIATRIC DISORDERS Depression negative neurological ROS     GI/Hepatic Neg liver ROS, GERD-  Medicated,  Endo/Other  diabetes, Type 2, Oral Hypoglycemic AgentsHypothyroidism   Renal/GU negative Renal ROS     Musculoskeletal negative musculoskeletal ROS (+)   Abdominal   Peds  Hematology negative hematology ROS (+)   Anesthesia Other Findings   Reproductive/Obstetrics negative OB ROS                           Anesthesia Physical Anesthesia Plan  ASA: III  Anesthesia Plan: General   Post-op Pain Management:    Induction: Intravenous  Airway Management Planned: Oral ETT  Additional Equipment:   Intra-op Plan:   Post-operative Plan: Extubation in OR  Informed Consent: I have reviewed the patients History and Physical, chart, labs and discussed the procedure including the risks, benefits and alternatives for the proposed anesthesia with the patient or authorized representative who has indicated his/her understanding and acceptance.   Dental advisory given  Plan Discussed with: CRNA  Anesthesia Plan Comments:         Anesthesia Quick Evaluation

## 2014-01-24 NOTE — Discharge Instructions (Signed)
1. Activity:  You are encouraged to ambulate frequently (about every hour during waking hours) to help prevent blood clots from forming in your legs or lungs.  However, you should not engage in any heavy lifting (> 10-15 lbs), strenuous activity, or straining. 2. Diet: You should continue a clear liquid diet until passing gas from below.  Once this occurs, you may advance your diet to a soft diet that would be easy to digest (i.e soups, scrambled eggs, mashed potatoes, etc.) for 24 hours just as you would if getting over a bad stomach flu.  If tolerating this diet well for 24 hours, you may then begin eating regular food.  It will be normal to have some amount of bloating, nausea, and abdominal discomfort intermittently. 3. Prescriptions:  You will be provided a prescription for pain medication to take as needed.  If your pain is not severe enough to require the prescription pain medication, you may take Tylenol instead.  You should also take an over the counter stool softener (Colace 100 mg twice daily) to avoid straining with bowel movements as the pain medication may constipate you. Finally, you will also be provided a prescription for an antibiotic to begin the day prior to your return visit in the office for catheter removal. 4. Catheter care: You will be taught how to take care of the catheter by the nursing staff prior to discharge from the hospital.  You may use both a leg bag and the larger bedside bag but it is recommended to at least use the bigger bedside bag at nighttime as the leg bag is small and will fill up overnight and also does not drain as well when lying flat. You may periodically feel a strong urge to void with the catheter in place.  This is a bladder spasm and most often can occur when having a bowel movement or when you are moving around. It is typically self-limited and usually will stop after a few minutes.  You may use some Vaseline or Neosporin around the tip of the catheter to  reduce friction at the tip of the penis. 5. Incisions: You may remove your dressing bandages the 2nd day after surgery.  You most likely will have a few small staples in each of the incisions and once the bandages are removed, the incisions may stay open to air.  You may start showering (not soaking or bathing in water) 48 hours after surgery and the incisions simply need to be patted dry after the shower.  No additional care is needed. 6. What to call us about: You should call the office [phone]) if you develop fever > 101, persistent vomiting, or the catheter stops draining. Also, feel free to call with any other questions you may have and remember the handout that was provided to you as a reference preoperatively which answers many of the common questions that arise after surgery.  You may resume aspirin, vitamins, and supplements 7 days after surgery.

## 2014-01-24 NOTE — Op Note (Signed)
Preoperative diagnosis: Clinically localized adenocarcinoma of the prostate (clinical stage T1c N0 M0)  Postoperative diagnosis: Clinically localized adenocarcinoma of the prostate (clinical stage T1c N0 M0)  Procedure:  1. Robotic assisted laparoscopic radical prostatectomy (non nerve sparing) 2. Bilateral robotic assisted laparoscopic pelvic lymphadenectomy  Surgeon: Pryor Curia. M.D.  Assistant(s): Leta Baptist, PA-C  Anesthesia: General  Complications: None  EBL: 75 mL  IVF:  2300 mL crystalloid  Specimens: 1. Prostate and seminal vesicles 2. Right pelvic lymph nodes 3. Left pelvic lymph nodes  Disposition of specimens: Pathology  Intraoperative findings: [name] had a known inflatable penile prosthesis with an intra-abdominal reservoir.  This did significantly increased the complexity of this procedure altering the dissection and time necessary to perform this procedure.  This also significantly increase the risk of the procedure considering the increased risk for infection of his prosthesis.  In addition, the patient had an extremely large prostate and had previously undergone prostate surgery which also created significant more difficulty and increased time associated with this procedure.  Overall, the time needed to perform this procedure was more than 30% greater than would be expected for an average procedure.  Drains: 1. 20 Fr coude catheter  Indication: [name] is a 73 y.o. year old patient with clinically localized, high risk prostate cancer.  After a thorough review of the management options for treatment of prostate cancer, he elected to proceed with surgical therapy and the above procedure(s). He does have an inflatable penile prosthesis with an intra-abdominal reservoir placed infrapubically. In addition, he has a very large prostate with a history of a prior TURP.   We have discussed the potential benefits and risks of the procedure, side  effects of the proposed treatment, the likelihood of the patient achieving the goals of the procedure, and any potential problems that might occur during the procedure or recuperation. We specifically discussed the potential for increased risk of prosthetic infection or damage. Informed consent has been obtained.  Description of procedure:  The patient was taken to the operating room and a general anesthetic was administered. He was given preoperative antibiotics (including vancomycin and gentamicin considering his penile prosthesis), placed in the dorsal lithotomy position, and prepped and draped in the usual sterile fashion. Next a preoperative timeout was performed. A urethral catheter was placed into the bladder and a site was selected near the umbilicus for placement of the camera port. This was placed using a standard open Hassan technique which allowed entry into the peritoneal cavity under direct vision and without difficulty. A 12 mm port was placed and a pneumoperitoneum established. The camera was then used to inspect the abdomen. The penile prosthesis reservoir was located just to the left of midline and identified just anterior to the bladder.  Approximately 200 cc of sterile saline was instilled into the bladder to help identify the contour of the bladder and to separate it from the prosthesis reservoir. The remaining abdominal ports were then placed. 8 mm robotic ports were placed in the right lower quadrant, left lower quadrant, and far left lateral abdominal wall. A 5 mm port was placed in the right upper quadrant and a 12 mm port was placed in the right lateral abdominal wall for laparoscopic assistance. All ports were placed under direct vision without difficulty. The surgical cart was then docked.   Utilizing the cautery scissors, the bladder was reflected posteriorly allowing entry into the space of Retzius and identification of the endopelvic fascia and prostate. Utilizing  the scrotal  pump, the penile prosthesis was cycled creating an erection and decrease in the fluid within the reservoir.  Dissection then proceeded. Great care was taken to avoid damage or to perform excessive dissection near the prosthesis reservoir. The bladder was able to be reflected posteriorly and the reservoir was carefully released and allowed to remain anteriorly toward the left side of the abdomen in its deflated state. This did take a significant amount of time and significant more time compared to a standard dissection.  Once the reservoir was completely released from the bladder, dissection proceeded down to the prostate. The periprostatic fat was then removed from the prostate allowing full exposure of the endopelvic fascia. The prostate was noted to be exceedingly large but otherwise without obvious evidence of advanced disease. The endopelvic fascia was then incised from the apex back to the base of the prostate bilaterally and the underlying levator muscle fibers were swept laterally off the prostate thereby isolating the dorsal venous complex. The dorsal vein was then stapled and divided with a 45 mm Flex Echelon stapler. Attention then turned to the bladder neck which was divided anteriorly thereby allowing entry into the bladder and exposure of the urethral catheter. A large TUR defect was noted within the prostate and considering that the patient's high-grade disease was noted toward the prostate based on his biopsy specimens, care was taken to avoid dissection to close to the prostate creating a wide bladder neck resection.  The ureteral orifices were identified and care was taken to avoid injury to the orifices. The catheter balloon was deflated and the catheter was brought into the operative field and used to retract the prostate anteriorly. The posterior bladder neck was then examined and was divided allowing further dissection between the bladder and prostate posteriorly until the vasa deferentia and  seminal vessels were identified. Great care was taken during this dissection although a large bladder neck was created out of necessity considering his TUR defect and the goal of a wide dissection considering his high risk disease located at the base of the prostate. The vasa deferentia were isolated, divided, and lifted anteriorly. The seminal vesicles were dissected down to their tips with care to control the seminal vascular arterial blood supply. These structures were then lifted anteriorly and the space between Denonvillier's fascia and the anterior rectum was developed with a combination of sharp and blunt dissection. This isolated the vascular pedicles of the prostate.  A wide non nerve sparing dissection was performed with Weck clips used to ligate the vascular pedicles of the prostate bilaterally. The vascular pedicles of the prostate were then divided.  The urethra was then sharply transected allowing the prostate specimen to be disarticulated. The pelvis was copiously irrigated and hemostasis was ensured. There was no evidence for rectal injury.  Attention then turned to the right pelvic sidewall. The fibrofatty tissue between the external iliac vein, confluence of the iliac vessels, hypogastric artery, and Cooper's ligament was dissected free from the pelvic sidewall with care to preserve the obturator nerve. Weck clips were used for lymphostasis and hemostasis. An identical procedure was performed on the contralateral side and the lymphatic packets were removed for permanent pathologic analysis. Although the prosthesis reservoir was located toward the left side of the abdomen, it remained anterior enough that aside from a mild amount of desmoplastic reaction, a complete left-sided pelvic lymphadenectomy was able to be performed utilizing the aforementioned boundaries.  I then performed bladder neck reconstruction.  Utilizing 3-0 Vicryl sutures,  I closed the bladder neck at the 5 and 7:00  positions taking care to carefully identify the ureteral orifice on each side.  Approximately 3 or 4 sutures were placed in a running fashion on each side of the bladder resulting in an adequate bladder neck size.  Attention then turned to the urethral anastomosis. A 2-0 Vicryl slip knot was placed between Denonvillier's fascia, the posterior bladder neck, and the posterior urethra to reapproximate these structures. A double-armed 3-0 Monocryl suture was then used to perform a 360 running tension-free anastomosis between the bladder neck and urethra. A new urethral catheter was then placed into the bladder and irrigated. There were no blood clots within the bladder and the anastomosis appeared to be watertight. Although thought was given to placing a pelvic drain, considering there did not appear to be any evidence of urine leak on assessment of the urethral anastomosis and considering the increased risk for possible bacterial contamination/infection that might occur in the setting of a penile prosthesis reservoir, it was decided to forego a drain. The surgical cart was then undocked. The right lateral 12 mm port site was closed at the fascial level with a 0 Vicryl suture placed laparoscopically. All remaining ports were then removed under direct vision. The prostate specimen was removed intact within the Endopouch retrieval bag via the periumbilical camera port site.   Due to the very large size of the prostate, the bag was completely full and this did require extension of the periumbilical incision and a periumbilical fashion. This fascial opening was closed with two running 0 Vicryl sutures. 0.25% Marcaine was then injected into all port sites and all incisions were reapproximated at the skin level with 4-0 Monocryl subcuticular sutures and Dermabond. The patient appeared to tolerate the procedure well and without complications. The patient was able to be extubated and transferred to the recovery unit in  satisfactory condition.  Pryor Curia MD

## 2014-01-24 NOTE — Anesthesia Procedure Notes (Signed)
Procedure Name: Intubation Date/Time: 01/24/2014 10:59 AM Performed by: Danley Danker L Patient Re-evaluated:Patient Re-evaluated prior to inductionOxygen Delivery Method: Circle system utilized Preoxygenation: Pre-oxygenation with 100% oxygen Intubation Type: IV induction Ventilation: Mask ventilation without difficulty and Oral airway inserted - appropriate to patient size Laryngoscope Size: Mac and 4 Grade View: Grade I Tube type: Oral Tube size: 8.0 mm Number of attempts: 2 Airway Equipment and Method: Stylet Placement Confirmation: ETT inserted through vocal cords under direct vision,  positive ETCO2 and breath sounds checked- equal and bilateral Secured at: 22 cm Tube secured with: Tape Dental Injury: Teeth and Oropharynx as per pre-operative assessment  Comments: Intubated by EMT

## 2014-01-24 NOTE — Preoperative (Signed)
Beta Blockers   Reason not to administer Beta Blockers:Not Applicable 

## 2014-01-25 LAB — HEMOGLOBIN AND HEMATOCRIT, BLOOD
HCT: 38.3 % — ABNORMAL LOW (ref 39.0–52.0)
Hemoglobin: 13.3 g/dL (ref 13.0–17.0)

## 2014-01-25 LAB — GLUCOSE, CAPILLARY
Glucose-Capillary: 124 mg/dL — ABNORMAL HIGH (ref 70–99)
Glucose-Capillary: 164 mg/dL — ABNORMAL HIGH (ref 70–99)

## 2014-01-25 MED ORDER — HYDROCODONE-ACETAMINOPHEN 5-325 MG PO TABS
1.0000 | ORAL_TABLET | Freq: Four times a day (QID) | ORAL | Status: DC | PRN
  Administered 2014-01-25: 2 via ORAL
  Filled 2014-01-25: qty 2

## 2014-01-25 MED ORDER — BISACODYL 10 MG RE SUPP
10.0000 mg | Freq: Once | RECTAL | Status: AC
  Administered 2014-01-25: 10 mg via RECTAL
  Filled 2014-01-25: qty 1

## 2014-01-25 MED ORDER — PHENOL 1.4 % MT LIQD
1.0000 | OROMUCOSAL | Status: DC | PRN
  Administered 2014-01-25: 1 via OROMUCOSAL
  Filled 2014-01-25: qty 177

## 2014-01-25 NOTE — Progress Notes (Signed)
Patient ID: [name], male   DOB: 16-Mar-1941, 73 y.o.   MRN: [id number]  1 Day Post-Op Subjective: The patient is doing well.  No nausea or vomiting. Pain is adequately controlled.  Objective: Vital signs in last 24 hours: Temp:  [97.7 F (36.5 C)-100.3 F (37.9 C)] 100.3 F (37.9 C) (02/24 0506) Pulse Rate:  [93-117] 93 (02/24 0506) Resp:  [12-24] 18 (02/24 0506) BP: (131-189)/(62-94) 131/62 mmHg (02/24 0506) SpO2:  [92 %-98 %] 96 % (02/24 0506) Weight:  [105.235 kg (232 lb)] 105.235 kg (232 lb) (02/23 2200)  Intake/Output from previous day: 02/23 0701 - 02/24 0700 In: 5980 [P.O.:480; I.V.:4250; IV Piggyback:1250] Out: 2225 [Urine:2150; Blood:75] Intake/Output this shift:    Physical Exam:  General: Alert and oriented. CV: RRR Lungs: Clear bilaterally. GI: Soft, Nondistended. Incisions: C/D/I Urine: Clear Extremities: Nontender, no erythema, no edema.  Lab Results:  Recent Labs  01/24/14 1625 01/25/14 0510  HGB 13.8 13.3  HCT 40.7 38.3*      Assessment/Plan: POD# 1 s/p robotic prostatectomy.  1) SL IVF 2) Ambulate, Incentive spirometry 3) Transition to oral pain medication 4) Dulcolax suppository 5) Plan for likely discharge later today   [name]. MD   LOS: 1 day   [name],LES 01/25/2014, 7:40 AM

## 2014-01-25 NOTE — Progress Notes (Signed)
Utilization review completed.  

## 2014-01-25 NOTE — Discharge Summary (Addendum)
  Date of admission: 01/24/2014  Date of discharge: 01/25/2014  Admission diagnosis: Prostate Cancer  Discharge diagnosis: Prostate Cancer  History and Physical: For full details, please see admission history and physical. Briefly, [name] is a 73 y.o. gentleman with localized prostate cancer.  After discussing management/treatment options, he elected to proceed with surgical treatment.  Hospital Course: [name] was taken to the operating room on 01/24/2014 and underwent a robotic assisted laparoscopic radical prostatectomy. He tolerated this procedure well and without complications. Postoperatively, he was able to be transferred to a regular hospital room following recovery from anesthesia.  He was able to begin ambulating the night of surgery. He remained hemodynamically stable overnight.  He had excellent urine output.  He was transitioned to oral pain medication, tolerated a clear liquid diet, and had met all discharge criteria and was able to be discharged home later on POD#1.  Laboratory values:  Recent Labs  01/24/14 1625 01/25/14 0510  HGB 13.8 13.3  HCT 40.7 38.3*    Disposition: Home  Discharge instruction: He was instructed to be ambulatory but to refrain from heavy lifting, strenuous activity, or driving. He was instructed on urethral catheter care.  Discharge medications:     Medication List    STOP taking these medications       beta carotene w/minerals tablet     traMADol 50 MG tablet  Commonly known as:  ULTRAM      TAKE these medications       amLODipine 10 MG tablet  Commonly known as:  NORVASC  Take 10 mg by mouth every morning.     ciprofloxacin 500 MG tablet  Commonly known as:  CIPRO  Take 1 tablet (500 mg total) by mouth 2 (two) times daily. Start day prior to office visit for foley removal     HYDROcodone-acetaminophen 5-325 MG per tablet  Commonly known as:  NORCO  Take 1-2 tablets by mouth every 6 (six) hours as needed.     insulin aspart 100 UNIT/ML injection  Commonly known as:  novoLOG  Inject 20-40 Units into the skin 3 (three) times daily with meals. When sugar is elevated     insulin glargine 100 UNIT/ML injection  Commonly known as:  LANTUS  Inject 30-40 Units into the skin at bedtime.     levothyroxine 50 MCG tablet  Commonly known as:  SYNTHROID, LEVOTHROID  Take 50 mcg by mouth daily before breakfast.     losartan 100 MG tablet  Commonly known as:  COZAAR  Take 100 mg by mouth every morning.     mirtazapine 15 MG tablet  Commonly known as:  REMERON  Take 15 mg by mouth at bedtime.     ONE TOUCH ULTRA TEST test strip  Generic drug:  glucose blood  Test four times a day     potassium chloride SA 20 MEQ tablet  Commonly known as:  K-DUR,KLOR-CON  Take 20 mEq by mouth daily.     simvastatin 80 MG tablet  Commonly known as:  ZOCOR  Take 40 mg by mouth every evening.        Followup: He will followup in 1 week for catheter removal and to discuss his surgical pathology results.

## 2014-01-26 ENCOUNTER — Encounter (HOSPITAL_COMMUNITY): Payer: Self-pay | Admitting: Urology

## 2014-01-28 LAB — GLUCOSE, CAPILLARY
Glucose-Capillary: 153 mg/dL — ABNORMAL HIGH (ref 70–99)
Glucose-Capillary: 171 mg/dL — ABNORMAL HIGH (ref 70–99)
Glucose-Capillary: 186 mg/dL — ABNORMAL HIGH (ref 70–99)

## 2014-02-10 DIAGNOSIS — N393 Stress incontinence (female) (male): Secondary | ICD-10-CM | POA: Diagnosis not present

## 2014-02-10 DIAGNOSIS — Z9889 Other specified postprocedural states: Secondary | ICD-10-CM | POA: Diagnosis not present

## 2014-02-10 DIAGNOSIS — C61 Malignant neoplasm of prostate: Secondary | ICD-10-CM | POA: Diagnosis not present

## 2014-02-20 ENCOUNTER — Encounter: Payer: Self-pay | Admitting: Internal Medicine

## 2014-02-21 DIAGNOSIS — R279 Unspecified lack of coordination: Secondary | ICD-10-CM | POA: Diagnosis not present

## 2014-02-21 DIAGNOSIS — M6281 Muscle weakness (generalized): Secondary | ICD-10-CM | POA: Diagnosis not present

## 2014-02-21 DIAGNOSIS — N393 Stress incontinence (female) (male): Secondary | ICD-10-CM | POA: Diagnosis not present

## 2014-02-28 ENCOUNTER — Encounter (INDEPENDENT_AMBULATORY_CARE_PROVIDER_SITE_OTHER): Payer: Medicare Other | Admitting: Ophthalmology

## 2014-02-28 DIAGNOSIS — E1139 Type 2 diabetes mellitus with other diabetic ophthalmic complication: Secondary | ICD-10-CM

## 2014-02-28 DIAGNOSIS — E11319 Type 2 diabetes mellitus with unspecified diabetic retinopathy without macular edema: Secondary | ICD-10-CM

## 2014-02-28 DIAGNOSIS — E1165 Type 2 diabetes mellitus with hyperglycemia: Secondary | ICD-10-CM

## 2014-02-28 DIAGNOSIS — H3530 Unspecified macular degeneration: Secondary | ICD-10-CM | POA: Diagnosis not present

## 2014-02-28 DIAGNOSIS — H353290 Exudative age-related macular degeneration, unspecified eye, stage unspecified: Secondary | ICD-10-CM

## 2014-02-28 DIAGNOSIS — H35039 Hypertensive retinopathy, unspecified eye: Secondary | ICD-10-CM

## 2014-02-28 DIAGNOSIS — H2510 Age-related nuclear cataract, unspecified eye: Secondary | ICD-10-CM

## 2014-02-28 DIAGNOSIS — H43819 Vitreous degeneration, unspecified eye: Secondary | ICD-10-CM

## 2014-02-28 DIAGNOSIS — I10 Essential (primary) hypertension: Secondary | ICD-10-CM

## 2014-04-07 DIAGNOSIS — C61 Malignant neoplasm of prostate: Secondary | ICD-10-CM | POA: Diagnosis not present

## 2014-04-18 ENCOUNTER — Telehealth: Payer: Self-pay | Admitting: *Deleted

## 2014-04-18 NOTE — Telephone Encounter (Signed)
Called patient to introduce myself as Prostate Oncology Navigator and coordinator of the Prostate [facility], to confirm his referral for the clinic on 04/16/14, location of [facility], arrival time of 12:30, registration procedure, and format of clinic.  He verbalized understanding.  I provided my phone number and encouraged him to call me if he has any questions after receiving the Information Packet or prior to my call the day before clinic.  He verbalized understanding and expressed appreciation for my call.  Gayleen Orem, RN, BSN, [facility] Prostate Oncology Navigator [phone]

## 2014-04-21 ENCOUNTER — Telehealth: Payer: Self-pay | Admitting: Oncology

## 2014-04-21 NOTE — Telephone Encounter (Signed)
C/D 04/21/14 for appt. 04/26/14

## 2014-04-22 ENCOUNTER — Encounter: Payer: Self-pay | Admitting: Radiation Oncology

## 2014-04-22 ENCOUNTER — Telehealth: Payer: Self-pay | Admitting: *Deleted

## 2014-04-22 NOTE — Telephone Encounter (Signed)
Called patient to see if he had any questions prior to his attendance at next [facility].  He denied any questions.  He stated he had yet to receive the packet with medical information forms I mailed earlier this week.  I indicated there would be a set for him to complete when he attends the clinic if the packet is not received by Saturday.  He confirmed his understanding of a 12:30 arrival time and location of [facility].  Gayleen Orem, RN, BSN, [facility] Prostate Oncology Navigator [phone]

## 2014-04-22 NOTE — Progress Notes (Signed)
GU Location of Tumor / Histology: prostatic adenocarcinoma   If Prostate Cancer, Gleason Score is (3 + 4) and PSA is (6.8) pretreatment.  Patient presented in 2009 with irritative and constrictive voiding symptoms  Biopsies of prostate (if applicable) revealed:    Past/Anticipated interventions by urology, if any: PSA surveillance with treatment in the future only if recurrence is noted. However, technically, he would be an appropriate candidate to consider either adjuvant radiation therapy or androgen deprivation therapy in the adjuvant setting.  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 NO  Pacemaker/ICD? NO  Possible current pregnancy? N/A  Is the patient on methotrexate? NO  Current Complaints / other details:  73 year old male. Married. Retired. 6'. NKDA. Prostate volume was 131 cc.

## 2014-04-26 ENCOUNTER — Encounter: Payer: Self-pay | Admitting: Radiation Oncology

## 2014-04-26 ENCOUNTER — Encounter: Payer: Self-pay | Admitting: Specialist

## 2014-04-26 ENCOUNTER — Ambulatory Visit (HOSPITAL_BASED_OUTPATIENT_CLINIC_OR_DEPARTMENT_OTHER): Payer: Medicare Other | Admitting: Oncology

## 2014-04-26 ENCOUNTER — Encounter: Payer: Self-pay | Admitting: Oncology

## 2014-04-26 ENCOUNTER — Ambulatory Visit
Admission: RE | Admit: 2014-04-26 | Discharge: 2014-04-26 | Disposition: A | Discharge status: Home / Self Care | Payer: Medicare Other | Source: Ambulatory Visit | Attending: Radiation Oncology | Admitting: Radiation Oncology

## 2014-04-26 VITALS — BP 161/85 | HR 92 | Resp 16 | Ht 72.0 in | Wt 249.6 lb

## 2014-04-26 DIAGNOSIS — C9110 Chronic lymphocytic leukemia of B-cell type not having achieved remission: Secondary | ICD-10-CM | POA: Diagnosis not present

## 2014-04-26 DIAGNOSIS — I10 Essential (primary) hypertension: Secondary | ICD-10-CM | POA: Diagnosis not present

## 2014-04-26 DIAGNOSIS — E119 Type 2 diabetes mellitus without complications: Secondary | ICD-10-CM

## 2014-04-26 DIAGNOSIS — C61 Malignant neoplasm of prostate: Secondary | ICD-10-CM | POA: Diagnosis not present

## 2014-04-26 HISTORY — DX: Post-traumatic stress disorder, unspecified: F43.10

## 2014-04-26 NOTE — Progress Notes (Signed)
Please see consult note.  

## 2014-04-26 NOTE — Progress Notes (Signed)
Radiation Oncology         (336) [phone] ________________________________  Multidisciplinary Prostate Cancer Clinic  Initial Radiation Oncology Consultation  Name: [name] MRN: [id number]  Date: 04/26/2014  DOB: 12-29-1940  YS:[id number] Mallie Mussel, MD  Raynelle Bring, MD   REFERRING PHYSICIAN: Raynelle Bring, MD  DIAGNOSIS: 73 y.o. 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 gentleman status post previous TURP in 2009.  He was noted to have an elevated PSA of 6.8.  Accordingly, he was evlauated in urology by [name] on 11/02/14,  digital rectal examination was performed at that time revealing a 3+ gland with no nodules.  The patient proceeded to transrectal ultrasound with 12 biopsies of the prostate on 12/08/13.  Out of 12 core biopsies, 5 were positive.  The maximum Gleason score was 4+4, and this was seen in the distribution displayed in the image below:    He had robotic assisted laparoscopic radical prostatectomy on 01/24/2014 with Dr. Alinda Money revealing:    Post-operatively, his PSA is undetectable  The patient reviewed the pathology results with his urologist and he has kindly been referred today to the multidisciplinary prostate cancer clinic for presentation of pathology and radiology studies in our conference for discussion of potential radiation treatment options and clinical evaluation.  PREVIOUS RADIATION THERAPY: No  PAST MEDICAL HISTORY:  has a past medical history of Diabetes mellitus; Diverticulosis of colon; GERD (gastroesophageal reflux disease); Hyperlipidemia; Hypertension; OSA (obstructive sleep apnea); Colon polyps; Elevated PSA; Diabetic retinopathy; Hypothyroidism; IBS (irritable bowel syndrome); Macular degeneration; Arthritis; PONV (postoperative nausea and vomiting); Depression; DDD (degenerative disc disease), lumbar; DDD  (degenerative disc disease), lumbar; and Prostate cancer.    PAST SURGICAL HISTORY: Past Surgical History  Procedure Laterality Date  . Orif tibia fracture Right   . Cholecystectomy    . Hemorrhoid surgery    . Laparoscopic gastric banding  03/05/11    weight loss  . Colonoscopy  03/25/12, 09/28/08  . Esophagogastroduodenoscopy endoscopy  09/28/08  . Penile prosthesis implant    . Laparoscopic gastric banding with hiatal hernia repair  03/05/2011  . Foot surgery Bilateral   . Shoulder surgery Right 2011  . Vasectomy    . Robot assisted laparoscopic radical prostatectomy N/A 01/24/2014    Procedure: ROBOTIC ASSISTED LAPAROSCOPIC RADICAL PROSTATECTOMY LEVEL 3;  Surgeon: Dutch Gray, MD;  Location: [facility];  Service: Urology;  Laterality: N/A;  . Lymphadenectomy Bilateral 01/24/2014    Procedure: LYMPHADENECTOMY;  Surgeon: Dutch Gray, MD;  Location: [facility];  Service: Urology;  Laterality: Bilateral;    FAMILY HISTORY: family history includes Alcohol abuse in his brother; Heart attack in his paternal uncle; Hypertension in his mother and paternal uncle; Stomach cancer (age of onset: 80) in his mother. There is no history of Colon cancer, Colon polyps, or Rectal cancer.  SOCIAL HISTORY:  reports that he quit smoking about 33 years ago. His smoking use included Cigarettes. He smoked 0.00 packs per day. He has never used smokeless tobacco. He reports that he does not drink alcohol or use illicit drugs.  ALLERGIES: Morphine sulfate  MEDICATIONS:  Current Outpatient Prescriptions  Medication Sig Dispense Refill  . amLODipine (NORVASC) 10 MG tablet Take 10 mg by mouth every morning.      . ciprofloxacin (CIPRO) 500 MG tablet Take 1 tablet (500 mg total) by mouth 2 (two) times daily. Start  day prior to office visit for foley removal  6 tablet  0  . HYDROcodone-acetaminophen (NORCO) 5-325 MG per tablet Take 1-2 tablets by mouth every 6 (six) hours as needed.  30 tablet  0  . insulin aspart (NOVOLOG)  100 UNIT/ML injection Inject 20-40 Units into the skin 3 (three) times daily with meals. When sugar is elevated      . insulin glargine (LANTUS) 100 UNIT/ML injection Inject 30-40 Units into the skin at bedtime.       Marland Kitchen levothyroxine (SYNTHROID, LEVOTHROID) 50 MCG tablet Take 50 mcg by mouth daily before breakfast.       . losartan (COZAAR) 100 MG tablet Take 100 mg by mouth every morning.      . mirtazapine (REMERON) 15 MG tablet Take 15 mg by mouth at bedtime.      . ONE TOUCH ULTRA TEST test strip Test four times a day  100 each  11  . potassium chloride SA (K-DUR,KLOR-CON) 20 MEQ tablet Take 20 mEq by mouth daily.      . simvastatin (ZOCOR) 80 MG tablet Take 40 mg by mouth every evening.       No current facility-administered medications for this encounter.    REVIEW OF SYSTEMS:  A 15 point review of systems is documented in the electronic medical record. This was obtained by the nursing staff. However, I reviewed this with the patient to discuss relevant findings and make appropriate changes.  A comprehensive review of systems was negative..  The patient completed an IPSS and IIEF questionnaire.  His IPSS score was 9 indicating moderate urinary symptoms.  He indicated that his erectile function is able to complete sexual activity due to his penile prosthesis.   PHYSICAL EXAM: This patient is in no acute distress.  He is alert and oriented.   height is 6' (1.829 m) and weight is 249 lb 9.6 oz (113.218 kg). His blood pressure is 161/85 and his pulse is 92. His respiration is 16.  He exhibits no respiratory distress or labored breathing.  He appears neurologically intact.  His mood is pleasant.  His affect is appropriate.  Please note the digital rectal exam findings described above.  KPS = 100  100 - Normal; no complaints; no evidence of disease. 90   - Able to carry on normal activity; minor signs or symptoms of disease. 80   - Normal activity with effort; some signs or symptoms of  disease. 33   - Cares for self; unable to carry on normal activity or to do active work. 60   - Requires occasional assistance, but is able to care for most of his personal needs. 50   - Requires considerable assistance and frequent medical care. 59   - Disabled; requires special care and assistance. 58   - Severely disabled; hospital admission is indicated although death not imminent. 28   - Very sick; hospital admission necessary; active supportive treatment necessary. 10   - Moribund; fatal processes progressing rapidly. 0     - Dead  Karnofsky DA, Abelmann Ada, Craver LS and Burchenal [facility] [phone]) The use of the nitrogen mustards in the palliative treatment of carcinoma: with particular reference to bronchogenic carcinoma Cancer 1 [id number]   LABORATORY DATA:  Lab Results  Component Value Date   WBC 13.6* 01/13/2014   HGB 13.3 01/25/2014   HCT 38.3* 01/25/2014   MCV 83.2 01/13/2014   PLT 256 01/13/2014   Lab Results  Component Value Date   NA  142 01/13/2014   K 4.4 01/13/2014   CL 100 01/13/2014   CO2 27 01/13/2014   Lab Results  Component Value Date   ALT 29 11/16/2013   AST 21 11/16/2013   ALKPHOS 56 11/16/2013   BILITOT 1.7* 11/16/2013     RADIOGRAPHY: No results found.    IMPRESSION: This gentleman is a 73 y.o. 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  He certainly remains at risk for disease recurrence locally within the pelvis as well as distantly. Radiotherapy directed to the pelvic region and prostatic fossa may reduce his risk for local regional recurrence.  PLAN:Today I reviewed the findings and workup thus far.  We discussed the natural history of prostate cancer.  We reviewed the the implications of positive lymph node involvement on the risk of prostate cancer recurrence. We reviewed some of the evidence suggesting an advantage for patients who undergo adjuvant radiotherapy in the setting in terms  of disease control and survival. We also discussed some of the dilemmas related to the available evidence.    It is possible that with careful surveillance with ultrasensitive PSA we may have an opportunity for early salvage in patients who undergo observation.  We discussed radiation treatment directed to the prostatic fossa with regard to the logistics and delivery of external beam radiation treatment.  The patient would like to proceed with PSA surveillance.  I will share my findings with Dr. Alinda Money and will look forward to following along in his progress.     I enjoyed meeting with him today, and will look forward to participating in the care of this very nice gentleman.   I spent time face to face with the patient and more than 50% of that time was spent in counseling and/or coordination of care.   ------------------------------------------------  Sheral Apley. Tammi Klippel, M.D.

## 2014-04-26 NOTE — Consult Note (Signed)
Reason for Referral: Prostate cancer and CLL.   HPI: 73 year old gentleman currently of [place], [place] where he lives majority of his life. 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 He did have Agent Orange exposure and gets his medical care partially in the [place] system. He has history of diabetes and hypertension and was diagnosed with prostate cancer with an elevated PSA of 6.8. He was found to have Gleason score 3+4 equals 7 prostate cancer and clinical stage TI C. He underwent a robotic prostatectomy in February of 2015 and his pathology showed prostate adenocarcinoma Gleason score 3+4 equals 7 with disease involving both lobes. There is no evidence of extraprostatic extension or angiolymphatic invasion. He had one out of 7 lymph glands involved with prostate cancer. 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 They were found to be CD20 positive as well as CD5 positive and CD23 positive. He recovered well from this operation and was presented today at the prostate cancer multidisciplinary clinic. He does not report any headaches blurred vision or double vision. Does not report any syncope or alteration of mental status. He does not report any chest pain shortness of breath or cough. Does not report any nausea or vomiting or abdominal pain. He does not report any hematochezia or melena. His urine symptoms are improving dramatically. He has not reported any bleeding or clotting tendencies. He has not reported any fevers, chills, sweats or any lymphadenopathy. He continues to perform activities of daily living without any hindrance or decline. Rest of his review of system was unremarkable.   Past Medical History  Diagnosis Date  . Diabetes mellitus   . Diverticulosis of colon   . GERD (gastroesophageal reflux disease)   . Hyperlipidemia   . Hypertension   . OSA (obstructive sleep apnea)   . Colon polyps   . Elevated  PSA   . Diabetic retinopathy   . Hypothyroidism   . IBS (irritable bowel syndrome)   . Macular degeneration     followed by ophthalmology  . Arthritis   . PONV (postoperative nausea and vomiting)   . Depression     PTSD  . DDD (degenerative disc disease), lumbar   . DDD (degenerative disc disease), lumbar     last lumbar steroid injection 11/24/13  . Prostate cancer   :  Past Surgical History  Procedure Laterality Date  . Orif tibia fracture Right   . Cholecystectomy    . Hemorrhoid surgery    . Laparoscopic gastric banding  03/05/11    weight loss  . Colonoscopy  03/25/12, 09/28/08  . Esophagogastroduodenoscopy endoscopy  09/28/08  . Penile prosthesis implant    . Laparoscopic gastric banding with hiatal hernia repair  03/05/2011  . Foot surgery Bilateral   . Shoulder surgery Right 2011  . Vasectomy    . Robot assisted laparoscopic radical prostatectomy N/A 01/24/2014    Procedure: ROBOTIC ASSISTED LAPAROSCOPIC RADICAL PROSTATECTOMY LEVEL 3;  Surgeon: Dutch Gray, MD;  Location: [facility];  Service: Urology;  Laterality: N/A;  . Lymphadenectomy Bilateral 01/24/2014    Procedure: LYMPHADENECTOMY;  Surgeon: Dutch Gray, MD;  Location: [facility];  Service: Urology;  Laterality: Bilateral;  :   Current Outpatient Prescriptions  Medication Sig Dispense Refill  . amLODipine (NORVASC) 10 MG tablet Take 10 mg by mouth every morning.      . ciprofloxacin (CIPRO) 500 MG tablet Take 1 tablet (500 mg total) by mouth 2 (two)  times daily. Start day prior to office visit for foley removal  6 tablet  0  . HYDROcodone-acetaminophen (NORCO) 5-325 MG per tablet Take 1-2 tablets by mouth every 6 (six) hours as needed.  30 tablet  0  . insulin aspart (NOVOLOG) 100 UNIT/ML injection Inject 20-40 Units into the skin 3 (three) times daily with meals. When sugar is elevated      . insulin glargine (LANTUS) 100 UNIT/ML injection Inject 30-40 Units into the skin at bedtime.       Marland Kitchen levothyroxine (SYNTHROID,  LEVOTHROID) 50 MCG tablet Take 50 mcg by mouth daily before breakfast.       . losartan (COZAAR) 100 MG tablet Take 100 mg by mouth every morning.      . mirtazapine (REMERON) 15 MG tablet Take 15 mg by mouth at bedtime.      . ONE TOUCH ULTRA TEST test strip Test four times a day  100 each  11  . potassium chloride SA (K-DUR,KLOR-CON) 20 MEQ tablet Take 20 mEq by mouth daily.      . simvastatin (ZOCOR) 80 MG tablet Take 40 mg by mouth every evening.       No current facility-administered medications for this visit.      Allergies  Allergen Reactions  . Morphine Sulfate     REACTION: rash: itching  :  Family History  Problem Relation Age of Onset  . Hypertension Mother   . Stomach cancer Mother 36  . Alcohol abuse Brother   . Hypertension Paternal Uncle   . Heart attack Paternal Uncle   . Colon cancer Neg Hx   . Colon polyps Neg Hx   . Rectal cancer Neg Hx   :  History   Social History  . Marital Status: Married    Spouse Name: N/A    Number of Children: 76  . Years of Education: N/A   Occupational History  . retired    Social History Main Topics  . Smoking status: Former Smoker    Types: Cigarettes    Quit date: 02/25/1981  . Smokeless tobacco: Never Used  . Alcohol Use: No  . Drug Use: No  . Sexual Activity: Yes     Comment: Been able to utilize his penile prosthesis successfully   Other Topics Concern  . Not on file   Social History Narrative  . No narrative on file  :   Exam: ECOG 0  General appearance: alert and cooperative Head: Normocephalic, without obvious abnormality Throat: lips, mucosa, and tongue normal; teeth and gums normal Neck: no adenopathy, no thyroid masses. Back: symmetric, no curvature. ROM normal. No CVA tenderness. Resp: clear to auscultation bilaterally Cardio: regular rate and rhythm, S1, S2 normal, no murmur, click, rub or gallop GI: soft, non-tender; bowel sounds normal; no masses,  no organomegaly Extremities:  extremities normal, atraumatic, no cyanosis or edema Pulses: 2+ and symmetric Lymph nodes: Cervical, supraclavicular, and axillary nodes normal. Neurologic: Grossly normal    Assessment and Plan:   73 year old gentleman with the following issues:  1. Prostate adenocarcinoma diagnosed in February of 2015. He presented with a PSA of 6.8 and underwent a robotic prostatectomy with the pathology showed T2c N1 disease with one out of 7 lymph nodes involved. His Gleason score was 3+4 equals 7. His case was discussed and the prostate cancer multidisciplinary clinic including reviewing imaging studies with radiology and reviewing his pathology by the reviewing pathologists. The the resulting opinion of the prostate cancer multidisciplinary participants was  extensively discussed with the patient. At this point, it was felt that no adjuvant treatment is necessary. I do not think adjuvant hormonal therapy is indicated. And Dr. Tammi Klippel did not feel adjuvant radiation therapy to add much to justify the complications associated with it.  2. CLL/SLL: This was incidentally found as a part of his lymph node dissections. The natural course of this disease was discussed in detail. His imaging studies will reviewed with radiology and does not appear to have any bulky adenopathy. His clinical examination did not reveal any lymphadenopathy at this time. His white cell count have ranged between 12-13,000 without any evidence of lymphocytosis in his peripheral blood. I feel we are probably dealing with stage 0 CLL/SLL. At this point, no treatment is indicated given the fact that he has no bulky disease or symptomatology. Complications of this disease were discussed today should he develops any. These would include symptomatic lymphadenopathy, autoimmune cytopenias, an opportunistic infections. At this point he is at low risk of developing these and requires no treatment. He will require active surveillance and I will like to  see him in my clinic in about 6 months to repeat laboratory testing and physical examination.  All his questions were answered today to his satisfaction.

## 2014-04-26 NOTE — Progress Notes (Signed)
Met with patient and spouse in [place]. Patient rated himself as a "2" on the distress screen. However, his wife told me he is a [place] Vet with PTSD and he has "good and bad days."  Patient denied needing any support from the [employer], but his wife said she would utilize the services. Provided her with information on the support center programs and services.  Epifania Gore, PhD, [place]

## 2014-04-26 NOTE — Progress Notes (Addendum)
Denies history of radiation therapy or having a pacemaker. Reports routinely get receives injections from Dr. Suella Broad in his lower back and neck. Reports that he often feels cold or chilled. Retired. Married to [employer]. Three children, two boys and one girl. Plus two step children. Reports he routinely performs testicular exams. Reports ringing in the ears. Reports he wears dentures. Reports that he wears hearing aids and glasses. Reports back pain and arthritis. Reports he bruises easily.

## 2014-04-26 NOTE — Addendum Note (Signed)
Encounter addended by: Heywood Footman, RN on: 04/26/2014  7:15 PM<BR>     Documentation filed: Chief Complaint Section, Flowsheet VN, Demographics Visit, Inpatient Patient Education, Inpatient Document Flowsheet, Notes Section

## 2014-04-26 NOTE — Consult Note (Signed)
History of Present Illness  [name] is a 73 year old with lymph node positive prostate cancer s/p a NNS RAL radical prostatectomy and BPLND on 01/24/14. His PSA became undetectable after surgery.  He was noted to have CLL incidentally on his lymphadenectomy specimen.   Diagnosis: pT2c N1 Mx, Gleason 3+4=7 adenocarcinoma with negative surgical margins (1/13 LNs) Pretreatment PSA: 6.8 Pretreatment erectile function: He had a pre-existing IPP.  Interval history:  He follows up today for further discussion regarding options for management of his lymph node positive prostate cancer and is incidental diagnosis of chronic lymphocytic leukemia.  He continues to do quite well and has again regain continence.  He denies any specific complaints today.     Past Medical History  1. History of Arthritis (V13.4)  2. History of Bulging Lumbar Disc (722.10)  3. History of CLL (chronic lymphocytic leukemia) (204.10)  4. History of depression (V11.8)  5. History of diabetes mellitus (V12.29)  6. History of esophageal reflux (V12.79)  7. History of hypertension (V12.59)  8. History of hypothyroidism (V12.29)  9. History of sleep apnea (V13.89)  10. History of Macular degeneration of right eye (362.50)  Surgical History  1. History of Cholecystectomy Laparoscopic  2. History of Foot Surgery  3. History of Laparosc Gastric Restrictive Proc By Adjustable Gastric Band  4. History of Laparoscopy With Bilateral Total Pelvic Lymphadenectomy  5. History of Leg Repair  6. History of Prostatect Retropubic Radical W/ Nerve Sparing Laparoscopic  7. History of Shoulder Arthroscopy With Rotator Cuff Repair  8. History of Surg Penis Insertion Of Penile Prosthesis  9. History of Tonsillectomy  10. History of Transurethral Resection Of Prostate (TURP)  Current Meds  1. Insulin;  Therapy: (Recorded:31Jul2013) to Recorded  2. Levothyroxine Sodium TABS;  Therapy: (Recorded:25Mar2008) to Recorded  3. Norvasc 5  MG Oral Tablet;  Therapy: (Recorded:06Nov2012) to Recorded  4. TraMADol HCl - 50 MG Oral Tablet;  Therapy: (Recorded:01Jul2014) to Recorded  Allergies  1. No Known Drug Allergies  Family History  1. Denied: Family history of prostate cancer  2. Family history of Heart Disease (V17.49) : Father  Social History   Activities of daily living (ADL's), independent   Denied: Alcohol Use   Exercise habits   Former Smoker   Living Situation: Supportive and safe   Marital History - Currently Married   Occupation:  Physical Exam Constitutional: Well nourished and well developed . No acute distress.    Results/Data Selected Results  PSA [id number] 10:16AM [name]  SPECIMEN TYPE: BLOOD   Test Name Result Flag Reference  PSA <0.01 ng/mL  <=4.00  RESULT REPEATED AND VERIFIED. TEST METHODOLOGY: ECLIA PSA (ELECTROCHEMILUMINESCENCE IMMUNOASSAY)     We have reviewed his prior imaging studies, laboratory results, and pathology results from both his prostate biopsy and radical prostatectomy specimen.  Findings are as dictated above.  Assessment  1. Prostate cancer (185)  Discussion/Summary   1.  Lymph node-positive prostate cancer: He had only one small portion of one lymph node that was involved with adenocarcinoma and there was no evidence of extranodal extension.  Considering that his PSA is now undetectable, options were reviewed with him today regarding starting androgen deprivation therapy, continuing with PSA surveillance only, or considering adjuvant radiation therapy.  The pros and cons of each approach were discussed with him in detail today and he was able to discuss these options with both Dr. Alen Hoover and Dr. Tammi Hoover.  The consensus of the clinic was to proceed with close  PSA monitoring at this time with plans to institute additional treatment in the form of androgen deprivation or possibly salvage radiation therapy in the future if he develops a recurrence.  All questions  were answered to his stated satisfaction.  He would like to continue surveillance under the care of Dr. Jeffie Hoover and be seen closer to home rather than coming to [facility] for every visit.  2.  CLL: Dr. Alen Hoover discussed this diagnosis with him in detail.  He likely has a very indolent disease process and will undergo monitoring with follow-up laboratory studies in the next 6 months.  Cc: Dr. Irine Hoover Dr. Phoebe Hoover   A total of 25 minutes were spent in the overall care of the patient today with 25 minutes in direct face to face consultation.    Signatures Electronically signed by : [name], M.D.; Apr 26 2014  4:38PM EST

## 2014-04-27 NOTE — Addendum Note (Signed)
Encounter addended by: Brooks Sailors, RN on: 04/27/2014  8:19 AM<BR>     Documentation filed: Visit Diagnoses, Notes Section

## 2014-04-27 NOTE — Progress Notes (Signed)
Met with patient as part of [facility].  Reintroduced my role as his navigator and encouraged him to call as he proceeds with treatments and appointments at [facility].  Provided the accompanying Care Plan Summary:                                        Care Plan Summary  Name:  [name] DOB:  1941-02-20  Your Medical Team:   Urologist -  Dr. Raynelle Bring, [facility]  Radiation Oncologist - Dr. Tyler Pita, [facility]   Medical Oncologist - Dr. Zola Button, [place] Recommendations: 1) Surveillance.  * This recommendation is based on information available as of today's consult.      Recommendations may change depending on the results of further tests or exams. Next Steps: 1) Re-check PSA in couple of months - [name] will schedule.  When appointments need to be scheduled, you will be contacted by [facility] and/or [facility].  Questions?  Please do not hesitate to call Gayleen Orem, RN, BSN, [facility] at [phone] with any questions or concerns.  Liliane Channel is Counsellor and is available to assist you while you're receiving your medical care at [facility]. ______________________________________________________________________________________________________________________  I encouraged him to call me with any questions or concerns as his treatments progress.  He indicated understanding.  Gayleen Orem, RN, BSN, [facility] Prostate Oncology Navigator [phone]   .

## 2014-05-09 ENCOUNTER — Encounter (INDEPENDENT_AMBULATORY_CARE_PROVIDER_SITE_OTHER): Payer: Medicare Other | Admitting: Ophthalmology

## 2014-05-09 DIAGNOSIS — H353290 Exudative age-related macular degeneration, unspecified eye, stage unspecified: Secondary | ICD-10-CM

## 2014-05-09 DIAGNOSIS — E1165 Type 2 diabetes mellitus with hyperglycemia: Secondary | ICD-10-CM

## 2014-05-09 DIAGNOSIS — H35039 Hypertensive retinopathy, unspecified eye: Secondary | ICD-10-CM

## 2014-05-09 DIAGNOSIS — E11319 Type 2 diabetes mellitus with unspecified diabetic retinopathy without macular edema: Secondary | ICD-10-CM | POA: Diagnosis not present

## 2014-05-09 DIAGNOSIS — E1139 Type 2 diabetes mellitus with other diabetic ophthalmic complication: Secondary | ICD-10-CM | POA: Diagnosis not present

## 2014-05-09 DIAGNOSIS — H3530 Unspecified macular degeneration: Secondary | ICD-10-CM | POA: Diagnosis not present

## 2014-05-09 DIAGNOSIS — I10 Essential (primary) hypertension: Secondary | ICD-10-CM

## 2014-05-09 DIAGNOSIS — H43819 Vitreous degeneration, unspecified eye: Secondary | ICD-10-CM

## 2014-05-11 DIAGNOSIS — M542 Cervicalgia: Secondary | ICD-10-CM | POA: Diagnosis not present

## 2014-05-11 DIAGNOSIS — M545 Low back pain, unspecified: Secondary | ICD-10-CM | POA: Diagnosis not present

## 2014-05-13 ENCOUNTER — Encounter (INDEPENDENT_AMBULATORY_CARE_PROVIDER_SITE_OTHER): Payer: Medicare Other | Admitting: Ophthalmology

## 2014-05-13 DIAGNOSIS — H353290 Exudative age-related macular degeneration, unspecified eye, stage unspecified: Secondary | ICD-10-CM | POA: Diagnosis not present

## 2014-05-16 ENCOUNTER — Encounter: Payer: Self-pay | Admitting: Internal Medicine

## 2014-05-16 ENCOUNTER — Ambulatory Visit (INDEPENDENT_AMBULATORY_CARE_PROVIDER_SITE_OTHER): Payer: Medicare Other | Admitting: Internal Medicine

## 2014-05-16 VITALS — BP 138/76 | HR 68 | Temp 98.3°F | Ht 72.0 in | Wt 248.0 lb

## 2014-05-16 DIAGNOSIS — E119 Type 2 diabetes mellitus without complications: Secondary | ICD-10-CM

## 2014-05-16 DIAGNOSIS — C9110 Chronic lymphocytic leukemia of B-cell type not having achieved remission: Secondary | ICD-10-CM

## 2014-05-16 DIAGNOSIS — E039 Hypothyroidism, unspecified: Secondary | ICD-10-CM

## 2014-05-16 DIAGNOSIS — I10 Essential (primary) hypertension: Secondary | ICD-10-CM

## 2014-05-16 DIAGNOSIS — C61 Malignant neoplasm of prostate: Secondary | ICD-10-CM | POA: Diagnosis not present

## 2014-05-16 DIAGNOSIS — E1139 Type 2 diabetes mellitus with other diabetic ophthalmic complication: Secondary | ICD-10-CM | POA: Diagnosis not present

## 2014-05-16 DIAGNOSIS — E785 Hyperlipidemia, unspecified: Secondary | ICD-10-CM

## 2014-05-16 HISTORY — DX: Chronic lymphocytic leukemia of B-cell type not having achieved remission: C91.10

## 2014-05-16 LAB — TSH: TSH: 1.12 u[IU]/mL (ref 0.35–4.50)

## 2014-05-16 LAB — HEMOGLOBIN A1C: Hgb A1c MFr Bld: 6.7 % — ABNORMAL HIGH (ref 4.6–6.5)

## 2014-05-16 MED ORDER — TRAMADOL HCL 50 MG PO TABS: 50.0000 mg | ORAL_TABLET | Freq: Four times a day (QID) | ORAL | Status: DC | PRN

## 2014-05-16 NOTE — Progress Notes (Signed)
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 Reviewed note Prostate CA- no treatment.  Weight- not trying to diet or exercise.  htn- tolerating meds  Hypothyroid- needs f/u Lab Results  Component Value Date   TSH 1.54 02/26/2011   Past Medical History  Diagnosis Date  . Diabetes mellitus   . Diverticulosis of colon   . GERD (gastroesophageal reflux disease)   . Hyperlipidemia   . Hypertension   . OSA (obstructive sleep apnea)   . Colon polyps   . Elevated PSA   . Diabetic retinopathy   . Hypothyroidism   . IBS (irritable bowel syndrome)   . Macular degeneration     followed by ophthalmology  . Arthritis   . PONV (postoperative nausea and vomiting)   . Depression     PTSD  . DDD (degenerative disc disease), lumbar   . DDD (degenerative disc disease), lumbar     last lumbar steroid injection 11/24/13  . Prostate cancer   . PTSD (post-traumatic stress disorder)     [facility]    History   Social History  . Marital Status: Married    Spouse Name: N/A    Number of Children: 36  . Years of Education: N/A   Occupational History  . retired    Social History Main Topics  . Smoking status: Former Smoker -- 1.00 packs/day for 20 years    Types: Cigarettes    Quit date: 02/25/1981  . Smokeless tobacco: Never Used  . Alcohol Use: No  . Drug Use: No  . Sexual Activity: Yes     Comment: Been able to utilize his penile prosthesis successfully   Other Topics Concern  . Not on file   Social History Narrative  . No narrative on file    Past Surgical History  Procedure Laterality Date  . Orif tibia fracture Right   . Cholecystectomy    . Hemorrhoid surgery    . Laparoscopic gastric banding  03/05/11    weight loss  . Colonoscopy  03/25/12, 09/28/08  . Esophagogastroduodenoscopy endoscopy  09/28/08  . Penile prosthesis implant    . Laparoscopic gastric banding with hiatal hernia repair  03/05/2011  . Foot surgery Bilateral   . Shoulder surgery Right 2011  . Vasectomy    .  Robot assisted laparoscopic radical prostatectomy N/A 01/24/2014    Procedure: ROBOTIC ASSISTED LAPAROSCOPIC RADICAL PROSTATECTOMY LEVEL 3;  Surgeon: Dutch Gray, MD;  Location: [facility];  Service: Urology;  Laterality: N/A;  . Lymphadenectomy Bilateral 01/24/2014    Procedure: LYMPHADENECTOMY;  Surgeon: Dutch Gray, MD;  Location: [facility];  Service: Urology;  Laterality: Bilateral;  . Tonsillectomy      age 35    Family History  Problem Relation Age of Onset  . Hypertension Mother   . Stomach cancer Mother 43  . Alcohol abuse Brother   . Hypertension Paternal Uncle   . Heart attack Paternal Uncle   . Colon cancer Neg Hx   . Colon polyps Neg Hx   . Rectal cancer Neg Hx     Allergies  Allergen Reactions  . Morphine Sulfate     REACTION: rash: itching    Current Outpatient Prescriptions on File Prior to Visit  Medication Sig Dispense Refill  . amLODipine (NORVASC) 10 MG tablet Take 10 mg by mouth every morning.      . insulin aspart (NOVOLOG) 100 UNIT/ML injection Inject 40 Units into the skin 3 (three) times daily with meals. When sugar is elevated      .  insulin glargine (LANTUS) 100 UNIT/ML injection Inject 50 Units into the skin at bedtime.       Marland Kitchen levothyroxine (SYNTHROID, LEVOTHROID) 50 MCG tablet Take 50 mcg by mouth daily before breakfast.       . losartan (COZAAR) 100 MG tablet Take 100 mg by mouth every morning.      . mirtazapine (REMERON) 15 MG tablet Take 15 mg by mouth at bedtime.      . ONE TOUCH ULTRA TEST test strip Test four times a day  100 each  11  . potassium chloride SA (K-DUR,KLOR-CON) 20 MEQ tablet Take 20 mEq by mouth daily.       No current facility-administered medications on file prior to visit.     patient denies chest pain, shortness of breath, orthopnea. Denies lower extremity edema, abdominal pain, change in appetite, change in bowel movements. Patient denies rashes, musculoskeletal complaints. No other specific complaints in a complete review of  systems.   BP 140/90  Pulse 68  Temp(Src) 98.3 F (36.8 C) (Oral)  Ht 6' (1.829 m)  Wt 248 lb (112.492 kg)  BMI 33.63 kg/m2  well-developed well-nourished male in no acute distress. HEENT exam atraumatic, normocephalic, neck supple without jugular venous distention. Chest clear to auscultation cardiac exam S1-S2 are regular. Abdominal exam overweight with bowel sounds, soft and nontender. Extremities no edema. Neurologic exam is alert with a normal gait.   HYPERTENSION Fair control Continue meds Weight loss is key  DIABETES MELLITUS, TYPE II Will check a1c Weight loss is key  DIABETIC  RETINOPATHY Has appointment with ophthalmology  Prostate cancer He has regular f/u with urology

## 2014-05-16 NOTE — Progress Notes (Signed)
Pre visit review using our clinic review tool, if applicable. No additional management support is needed unless otherwise documented below in the visit note. 

## 2014-05-16 NOTE — Assessment & Plan Note (Signed)
Has appointment with ophthalmology

## 2014-05-16 NOTE — Assessment & Plan Note (Signed)
He has regular f/u with urology

## 2014-05-16 NOTE — Assessment & Plan Note (Signed)
Will check a1c Weight loss is key

## 2014-05-16 NOTE — Assessment & Plan Note (Signed)
Fair control Continue meds Weight loss is key

## 2014-05-17 ENCOUNTER — Telehealth: Payer: Self-pay | Admitting: Internal Medicine

## 2014-05-17 ENCOUNTER — Ambulatory Visit: Payer: Medicare Other | Admitting: Internal Medicine

## 2014-05-17 NOTE — Telephone Encounter (Signed)
Relevant patient education assigned to patient using Emmi. ° °

## 2014-05-26 ENCOUNTER — Ambulatory Visit: Payer: Self-pay | Admitting: Radiation Oncology

## 2014-06-10 LAB — LIPID PANEL
Cholesterol: 161 mg/dL (ref 0–200)
HDL: 50 mg/dL (ref 35–70)
LDL Cholesterol: 55 mg/dL
Triglycerides: 280 mg/dL — AB (ref 40–160)

## 2014-06-10 LAB — CBC AND DIFFERENTIAL
Hemoglobin: 16 g/dL (ref 13.5–17.5)
Platelets: 243 10*3/uL (ref 150–399)
WBC: 9.3 10^3/mL

## 2014-06-10 LAB — BASIC METABOLIC PANEL
BUN: 13 mg/dL (ref 4–21)
Creatinine: 1 mg/dL (ref <=1.3)
Glucose: 118 mg/dL
Potassium: 3.8 mmol/L (ref 3.4–5.3)
Sodium: 139 mmol/L (ref 137–147)

## 2014-06-10 LAB — HEMOGLOBIN A1C: Hgb A1c MFr Bld: 6.7 % — AB (ref 4.0–6.0)

## 2014-06-10 LAB — HEPATIC FUNCTION PANEL
ALT: 43 U/L — AB (ref 10–40)
AST: 24 U/L (ref 14–40)
Bilirubin, Direct: 0.2 mg/dL
Bilirubin, Total: 1.1 mg/dL

## 2014-06-10 LAB — TSH: TSH: 1.59 u[IU]/mL (ref <=5.90)

## 2014-07-11 ENCOUNTER — Encounter (INDEPENDENT_AMBULATORY_CARE_PROVIDER_SITE_OTHER): Payer: Medicare Other | Admitting: Ophthalmology

## 2014-07-11 DIAGNOSIS — E1165 Type 2 diabetes mellitus with hyperglycemia: Secondary | ICD-10-CM

## 2014-07-11 DIAGNOSIS — H353290 Exudative age-related macular degeneration, unspecified eye, stage unspecified: Secondary | ICD-10-CM

## 2014-07-11 DIAGNOSIS — H43819 Vitreous degeneration, unspecified eye: Secondary | ICD-10-CM

## 2014-07-11 DIAGNOSIS — I10 Essential (primary) hypertension: Secondary | ICD-10-CM | POA: Diagnosis not present

## 2014-07-11 DIAGNOSIS — E1139 Type 2 diabetes mellitus with other diabetic ophthalmic complication: Secondary | ICD-10-CM | POA: Diagnosis not present

## 2014-07-11 DIAGNOSIS — H35039 Hypertensive retinopathy, unspecified eye: Secondary | ICD-10-CM

## 2014-07-11 DIAGNOSIS — E11319 Type 2 diabetes mellitus with unspecified diabetic retinopathy without macular edema: Secondary | ICD-10-CM | POA: Diagnosis not present

## 2014-07-15 ENCOUNTER — Encounter: Payer: Self-pay | Admitting: Internal Medicine

## 2014-07-15 ENCOUNTER — Other Ambulatory Visit (INDEPENDENT_AMBULATORY_CARE_PROVIDER_SITE_OTHER): Payer: Medicare Other

## 2014-07-15 DIAGNOSIS — C61 Malignant neoplasm of prostate: Secondary | ICD-10-CM

## 2014-07-15 NOTE — Progress Notes (Signed)
Pt came in for lab  only 

## 2014-07-16 LAB — PSA, TOTAL AND FREE
PSA, Free Pct: >10 %
PSA, Free: 0.01 ng/mL
PSA: <0.1 ng/mL (ref 0.0–4.0)

## 2014-07-18 ENCOUNTER — Encounter (INDEPENDENT_AMBULATORY_CARE_PROVIDER_SITE_OTHER): Payer: Medicare Other | Admitting: Ophthalmology

## 2014-07-29 ENCOUNTER — Encounter (INDEPENDENT_AMBULATORY_CARE_PROVIDER_SITE_OTHER): Payer: Medicare Other | Admitting: Ophthalmology

## 2014-07-29 DIAGNOSIS — E11319 Type 2 diabetes mellitus with unspecified diabetic retinopathy without macular edema: Secondary | ICD-10-CM | POA: Diagnosis not present

## 2014-07-29 DIAGNOSIS — E1165 Type 2 diabetes mellitus with hyperglycemia: Secondary | ICD-10-CM

## 2014-07-29 DIAGNOSIS — H2510 Age-related nuclear cataract, unspecified eye: Secondary | ICD-10-CM

## 2014-07-29 DIAGNOSIS — I10 Essential (primary) hypertension: Secondary | ICD-10-CM

## 2014-07-29 DIAGNOSIS — H43819 Vitreous degeneration, unspecified eye: Secondary | ICD-10-CM

## 2014-07-29 DIAGNOSIS — H353290 Exudative age-related macular degeneration, unspecified eye, stage unspecified: Secondary | ICD-10-CM | POA: Diagnosis not present

## 2014-07-29 DIAGNOSIS — H35039 Hypertensive retinopathy, unspecified eye: Secondary | ICD-10-CM

## 2014-07-29 DIAGNOSIS — E1139 Type 2 diabetes mellitus with other diabetic ophthalmic complication: Secondary | ICD-10-CM

## 2014-08-01 ENCOUNTER — Encounter (INDEPENDENT_AMBULATORY_CARE_PROVIDER_SITE_OTHER): Payer: Medicare Other | Admitting: Ophthalmology

## 2014-08-02 ENCOUNTER — Encounter: Payer: Self-pay | Admitting: *Deleted

## 2014-08-02 ENCOUNTER — Telehealth: Payer: Self-pay | Admitting: Oncology

## 2014-08-02 ENCOUNTER — Other Ambulatory Visit: Payer: Self-pay | Admitting: *Deleted

## 2014-08-02 DIAGNOSIS — N529 Male erectile dysfunction, unspecified: Secondary | ICD-10-CM | POA: Diagnosis not present

## 2014-08-02 DIAGNOSIS — C61 Malignant neoplasm of prostate: Secondary | ICD-10-CM | POA: Diagnosis not present

## 2014-08-02 DIAGNOSIS — N393 Stress incontinence (female) (male): Secondary | ICD-10-CM | POA: Diagnosis not present

## 2014-08-02 NOTE — Telephone Encounter (Signed)
Spk w/pt advised per 09/01 POF labs/ov on 11/03, pt confirmed and req mailed sch to pt, advised will mail .Marland KitchenMarland KitchenMarland KitchenMarland KitchenKJ

## 2014-08-30 ENCOUNTER — Encounter: Payer: Self-pay | Admitting: Family Medicine

## 2014-08-31 ENCOUNTER — Ambulatory Visit (INDEPENDENT_AMBULATORY_CARE_PROVIDER_SITE_OTHER): Payer: Medicare Other | Admitting: Family Medicine

## 2014-08-31 DIAGNOSIS — Z23 Encounter for immunization: Secondary | ICD-10-CM | POA: Diagnosis not present

## 2014-09-06 ENCOUNTER — Ambulatory Visit (INDEPENDENT_AMBULATORY_CARE_PROVIDER_SITE_OTHER): Payer: Medicare Other | Admitting: Family Medicine

## 2014-09-06 DIAGNOSIS — Z23 Encounter for immunization: Secondary | ICD-10-CM

## 2014-09-07 ENCOUNTER — Encounter: Payer: Self-pay | Admitting: Family Medicine

## 2014-09-08 ENCOUNTER — Ambulatory Visit: Payer: Medicare Other | Admitting: Family Medicine

## 2014-09-13 DIAGNOSIS — M5136 Other intervertebral disc degeneration, lumbar region: Secondary | ICD-10-CM | POA: Diagnosis not present

## 2014-09-13 DIAGNOSIS — M5030 Other cervical disc degeneration, unspecified cervical region: Secondary | ICD-10-CM | POA: Diagnosis not present

## 2014-09-19 ENCOUNTER — Encounter (INDEPENDENT_AMBULATORY_CARE_PROVIDER_SITE_OTHER): Payer: Medicare Other | Admitting: Ophthalmology

## 2014-09-19 DIAGNOSIS — E11319 Type 2 diabetes mellitus with unspecified diabetic retinopathy without macular edema: Secondary | ICD-10-CM

## 2014-09-19 DIAGNOSIS — H35033 Hypertensive retinopathy, bilateral: Secondary | ICD-10-CM

## 2014-09-19 DIAGNOSIS — H3532 Exudative age-related macular degeneration: Secondary | ICD-10-CM

## 2014-09-19 DIAGNOSIS — I10 Essential (primary) hypertension: Secondary | ICD-10-CM

## 2014-09-19 DIAGNOSIS — H43813 Vitreous degeneration, bilateral: Secondary | ICD-10-CM

## 2014-09-19 DIAGNOSIS — E11329 Type 2 diabetes mellitus with mild nonproliferative diabetic retinopathy without macular edema: Secondary | ICD-10-CM

## 2014-09-26 ENCOUNTER — Ambulatory Visit (INDEPENDENT_AMBULATORY_CARE_PROVIDER_SITE_OTHER): Payer: Medicare Other | Admitting: Family Medicine

## 2014-09-26 ENCOUNTER — Encounter: Payer: Self-pay | Admitting: Family Medicine

## 2014-09-26 VITALS — BP 142/78 | HR 87 | Temp 98.3°F | Wt 247.0 lb

## 2014-09-26 DIAGNOSIS — R1030 Lower abdominal pain, unspecified: Secondary | ICD-10-CM | POA: Diagnosis not present

## 2014-09-26 LAB — CBC WITH DIFFERENTIAL/PLATELET
Basophils Absolute: 0.1 10*3/uL (ref 0.0–0.1)
Basophils Relative: 0.5 % (ref 0.0–3.0)
Eosinophils Absolute: 0.2 10*3/uL (ref 0.0–0.7)
Eosinophils Relative: 1.5 % (ref 0.0–5.0)
HCT: 49.9 % (ref 39.0–52.0)
Hemoglobin: 16.5 g/dL (ref 13.0–17.0)
Lymphocytes Relative: 28.2 % (ref 12.0–46.0)
Lymphs Abs: 3.8 10*3/uL (ref 0.7–4.0)
MCHC: 33.1 g/dL (ref 30.0–36.0)
MCV: 83.9 fl (ref 78.0–100.0)
Monocytes Absolute: 1.1 10*3/uL — ABNORMAL HIGH (ref 0.1–1.0)
Monocytes Relative: 8 % (ref 3.0–12.0)
Neutro Abs: 8.3 10*3/uL — ABNORMAL HIGH (ref 1.4–7.7)
Neutrophils Relative %: 61.8 % (ref 43.0–77.0)
Platelets: 258 10*3/uL (ref 150.0–400.0)
RBC: 5.94 Mil/uL — ABNORMAL HIGH (ref 4.22–5.81)
RDW: 14.5 % (ref 11.5–15.5)
WBC: 13.5 10*3/uL — ABNORMAL HIGH (ref 4.0–10.5)

## 2014-09-26 NOTE — Patient Instructions (Signed)

## 2014-09-26 NOTE — Progress Notes (Signed)
Subjective:    Patient ID: [name], male    DOB: December 18, 1940, 73 y.o.   MRN: [id number]  HPI 73 year old male with history of prostate cancer, incidental finding of leukemia, IBS and diverticulosis presets today with abdominal pain x 1 week.  Pain is LLQ and suprapubic.  Pain is sharp and worse with sitting.  Feels bloated.  Non radiating. Afebrile, denies appetite changes, N/V, reflux symptoms or fatigue.  Has tried gasex with no relief.  Has been having bouts of constipation that later turn to diarrhea.  Denies melena or hematochezia.  Denies dysuria, frequency or hematuria.  Denies recent weight changes.     Review of Systems  Constitutional: Negative for fever, activity change, appetite change, fatigue and unexpected weight change.  Respiratory: Negative for cough.   Cardiovascular: Negative for chest pain.  Gastrointestinal: Positive for diarrhea, constipation and abdominal distention. Negative for nausea, vomiting, abdominal pain, blood in stool, anal bleeding and rectal pain.       Pain is to the LLQ.  Genitourinary: Negative for dysuria, urgency, frequency, hematuria, flank pain, decreased urine volume, scrotal swelling, difficulty urinating, genital sores and testicular pain.  Musculoskeletal: Negative for back pain and gait problem.       Pain is worse when sitting and if he then twists.  Neurological: Negative for dizziness, tremors, weakness, light-headedness, numbness and headaches.   Past Medical History  Diagnosis Date  . Diabetes mellitus   . Diverticulosis of colon   . GERD (gastroesophageal reflux disease)   . Hyperlipidemia   . Hypertension   . OSA (obstructive sleep apnea)   . Colon polyps   . Elevated PSA   . Diabetic retinopathy   . Hypothyroidism   . IBS (irritable bowel syndrome)   . Macular degeneration     followed by ophthalmology  . Arthritis   . PONV (postoperative nausea and vomiting)   . Depression     PTSD  . DDD (degenerative disc  disease), lumbar   . DDD (degenerative disc disease), lumbar     last lumbar steroid injection 11/24/13  . Prostate cancer   . PTSD (post-traumatic stress disorder)     [facility]   Family History  Problem Relation Age of Onset  . Hypertension Mother   . Stomach cancer Mother 69  . Alcohol abuse Brother   . Hypertension Paternal Uncle   . Heart attack Paternal Uncle   . Colon cancer Neg Hx   . Colon polyps Neg Hx   . Rectal cancer Neg Hx    History   Social History  . Marital Status: Married    Spouse Name: N/A    Number of Children: 48  . Years of Education: N/A   Occupational History  . retired    Social History Main Topics  . Smoking status: Former Smoker -- 1.00 packs/day for 20 years    Types: Cigarettes    Quit date: 02/25/1981  . Smokeless tobacco: Never Used  . Alcohol Use: No  . Drug Use: No  . Sexual Activity: Yes     Comment: Been able to utilize his penile prosthesis successfully   Other Topics Concern  . Not on file   Social History Narrative  . No narrative on file      Objective:   Physical Exam  Nursing note and vitals reviewed. Constitutional: He is oriented to person, place, and time. He appears well-developed and well-nourished. No distress.  Cardiovascular: Normal rate, regular rhythm  and normal heart sounds.   No murmur heard. Pulmonary/Chest: Effort normal and breath sounds normal. No respiratory distress. He has no wheezes. He has no rales.  Abdominal: Soft. Bowel sounds are normal. He exhibits no distension. There is no tenderness. There is no rebound and no guarding.  Several prior surgical scars diffusely to the abdomen.   Musculoskeletal: He exhibits no edema and no tenderness.  Neurological: He is alert and oriented to person, place, and time.  Skin: He is not diaphoretic.  Psychiatric: He has a normal mood and affect. His behavior is normal. Judgment and thought content normal.          Assessment & Plan:  Abdominal  pain- DDx: musculoskeletal from straining, gaseous, pain from several prior surgical adhesions.  Unlikely to be diverticulitis due to lack of reproducible pain or any point tenderness.  Get CBC to check WBC count.  If pain does not resolve, CT scan could be ordered.    Joseph Art, PA-S  As above. He is having fairly regular bowel movements so no history of impaction. He does not have any tenderness to exam at this time so diverticulitis unlikely. Pain is worse with movement which suggest musculoskeletal origin. No evidence for bowel obstruction.  Bruce Burchette M.D.

## 2014-09-26 NOTE — Progress Notes (Signed)
Pre visit review using our clinic review tool, if applicable. No additional management support is needed unless otherwise documented below in the visit note. 

## 2014-09-27 ENCOUNTER — Encounter: Payer: Self-pay | Admitting: Family Medicine

## 2014-09-29 ENCOUNTER — Telehealth: Payer: Self-pay | Admitting: Internal Medicine

## 2014-09-29 NOTE — Telephone Encounter (Signed)
OK I will see him  See if we can get him in sometime in Nov

## 2014-09-29 NOTE — Telephone Encounter (Signed)
Spoke with the wife. Husband responding in the background. They saw Dr Hassell Done a year ago and he did remove some of the lap band pressure, but they do not feel it helped. They really want to see you to discuss an EGD. They are certain there is something with the esophagus and not the lap band.

## 2014-09-29 NOTE — Telephone Encounter (Signed)
He has a lap band in and that may be causing his problems Has he seen his surgeon Dr. Hassell Done? I suspect he should start there

## 2014-09-29 NOTE — Telephone Encounter (Signed)
States this is not a new problem, but he has been having increased difficulty swallowing solids the past month. No vomiting. He is also having a sharp pain in his lower abdomen. He is not passing a lot of gas, but if feels like gas.

## 2014-09-30 NOTE — Telephone Encounter (Signed)
Appointment scheduled for discussion of his symptoms and recommendations.

## 2014-10-04 ENCOUNTER — Other Ambulatory Visit: Payer: Self-pay | Admitting: Oncology

## 2014-10-04 ENCOUNTER — Telehealth: Payer: Self-pay | Admitting: Oncology

## 2014-10-04 ENCOUNTER — Other Ambulatory Visit (HOSPITAL_BASED_OUTPATIENT_CLINIC_OR_DEPARTMENT_OTHER): Payer: Medicare Other

## 2014-10-04 ENCOUNTER — Ambulatory Visit (HOSPITAL_BASED_OUTPATIENT_CLINIC_OR_DEPARTMENT_OTHER): Payer: Medicare Other | Admitting: Oncology

## 2014-10-04 VITALS — BP 149/84 | HR 77 | Temp 98.8°F | Resp 18 | Ht 72.0 in | Wt 250.1 lb

## 2014-10-04 DIAGNOSIS — C61 Malignant neoplasm of prostate: Secondary | ICD-10-CM

## 2014-10-04 DIAGNOSIS — Z8579 Personal history of other malignant neoplasms of lymphoid, hematopoietic and related tissues: Secondary | ICD-10-CM | POA: Diagnosis not present

## 2014-10-04 DIAGNOSIS — Z8546 Personal history of malignant neoplasm of prostate: Secondary | ICD-10-CM

## 2014-10-04 DIAGNOSIS — Z8549 Personal history of malignant neoplasm of other male genital organs: Secondary | ICD-10-CM | POA: Diagnosis not present

## 2014-10-04 DIAGNOSIS — C9110 Chronic lymphocytic leukemia of B-cell type not having achieved remission: Secondary | ICD-10-CM

## 2014-10-04 LAB — COMPREHENSIVE METABOLIC PANEL (CC13)
ALT: 41 U/L (ref 0–55)
AST: 23 U/L (ref 5–34)
Albumin: 4.2 g/dL (ref 3.5–5.0)
Alkaline Phosphatase: 53 U/L (ref 40–150)
Anion Gap: 11 mEq/L (ref 3–11)
BUN: 14.4 mg/dL (ref 7.0–26.0)
CO2: 26 mEq/L (ref 22–29)
Calcium: 9.9 mg/dL (ref 8.4–10.4)
Chloride: 104 mEq/L (ref 98–109)
Creatinine: 1.1 mg/dL (ref 0.7–1.3)
Glucose: 95 mg/dl (ref 70–140)
Potassium: 3.9 mEq/L (ref 3.5–5.1)
Sodium: 142 mEq/L (ref 136–145)
Total Bilirubin: 0.86 mg/dL (ref 0.20–1.20)
Total Protein: 7.2 g/dL (ref 6.4–8.3)

## 2014-10-04 LAB — CBC WITH DIFFERENTIAL/PLATELET
BASO%: 0.4 % (ref 0.0–2.0)
Basophils Absolute: 0 10*3/uL (ref 0.0–0.1)
EOS%: 3.5 % (ref 0.0–7.0)
Eosinophils Absolute: 0.4 10*3/uL (ref 0.0–0.5)
HCT: 46.8 % (ref 38.4–49.9)
HGB: 16.4 g/dL (ref 13.0–17.1)
LYMPH%: 38.9 % (ref 14.0–49.0)
MCH: 28.6 pg (ref 27.2–33.4)
MCHC: 35 g/dL (ref 32.0–36.0)
MCV: 81.7 fL (ref 79.3–98.0)
MONO#: 0.7 10*3/uL (ref 0.1–0.9)
MONO%: 6.5 % (ref 0.0–14.0)
NEUT#: 5.7 10*3/uL (ref 1.5–6.5)
NEUT%: 50.7 % (ref 39.0–75.0)
Platelets: 217 10*3/uL (ref 140–400)
RBC: 5.73 10*6/uL (ref 4.20–5.82)
RDW: 13.9 % (ref 11.0–14.6)
WBC: 11.2 10*3/uL — ABNORMAL HIGH (ref 4.0–10.3)
lymph#: 4.3 10*3/uL — ABNORMAL HIGH (ref 0.9–3.3)

## 2014-10-04 NOTE — Progress Notes (Signed)
Hematology and Oncology Follow Up Visit  [name] [id number] 08/21/1941 73 y.o. 10/04/2014 8:52 AM [name], [place], Estella Husk, MD   Principle Diagnosis: 73 year old gentleman with prostate cancer diagnosed in February 2015 he had a Gleason score 3+4 = 7 clinical stage TIc and a PSA of 6.8. 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   Prior Therapy: he is status post robotic prostatectomy in February 2015 with the pathology revealed a Gleason score 3+4 = 7 without any evidence of extraprostatic extension.  Current therapy: observation and surveillance.  Interim History:  [name] presents today for a follow-up visit.since the last visit, he reports no new complaints. He has reported lower abdominal and pelvic discomfort at times. He is not reporting any urinary symptoms or constitutional symptoms. He does not report any fevers or chills or sweats. Does not report any chest pain or palpitation. His last PSA remains undetectable. He does not report any headaches, blurry vision or syncope. He does not report any nausea, vomiting, change in his bowel habits. He does not report any skeletal complaints of arthralgias or myalgias. Rest of his review of systems unremarkable.  Medications: I have reviewed the patient's current medications.  Current Outpatient Prescriptions  Medication Sig Dispense Refill  . amLODipine (NORVASC) 10 MG tablet Take 10 mg by mouth every morning.    . insulin aspart (NOVOLOG) 100 UNIT/ML injection Inject 40 Units into the skin 3 (three) times daily with meals. When sugar is elevated    . insulin glargine (LANTUS) 100 UNIT/ML injection Inject 50 Units into the skin at bedtime.     Marland Kitchen levothyroxine (SYNTHROID, LEVOTHROID) 50 MCG tablet Take 50 mcg by mouth daily before breakfast.     . losartan (COZAAR) 100 MG tablet Take 100 mg by mouth every morning.    . mirtazapine (REMERON) 15 MG tablet Take 15 mg by mouth at bedtime.     . ONE TOUCH ULTRA TEST test strip Test four times a day 100 each 11  . potassium chloride SA (K-DUR,KLOR-CON) 20 MEQ tablet Take 20 mEq by mouth daily.    . traMADol (ULTRAM) 50 MG tablet Take 1 tablet (50 mg total) by mouth every 6 (six) hours as needed. 30 tablet 5   No current facility-administered medications for this visit.     Allergies:  Allergies  Allergen Reactions  . Morphine Sulfate     REACTION: rash: itching    Past Medical History, Surgical history, Social history, and Family History were reviewed and updated.   Blood pressure 149/84, pulse 77, temperature 98.8 F (37.1 C), temperature source Oral, resp. rate 18, height 6' (1.829 m), weight 250 lb 1.6 oz (113.445 kg), SpO2 99 %. ECOG: 1 General appearance: alert and cooperative Head: Normocephalic, without obvious abnormality Neck: no adenopathy Lymph nodes: Cervical, supraclavicular, and axillary nodes normal. Heart:regular rate and rhythm, S1, S2 normal, no murmur, click, rub or gallop Lung:chest clear, no wheezing, rales, normal symmetric air entry. Abdomin: soft, non-tender, without masses or organomegaly EXT:no erythema, induration, or nodules   Lab Results: Lab Results  Component Value Date   WBC 11.2* 10/04/2014   HGB 16.4 10/04/2014   HCT 46.8 10/04/2014   MCV 81.7 10/04/2014   PLT 217 10/04/2014     Chemistry      Component Value Date/Time   NA 139 06/10/2014   NA 142 01/13/2014 1450   K 3.8 06/10/2014   CL 100 01/13/2014 1450   CO2  27 01/13/2014 1450   BUN 13 06/10/2014   BUN 16 01/13/2014 1450   CREATININE 1.0 06/10/2014   CREATININE 1.01 01/13/2014 1450   GLU 118 06/10/2014      Component Value Date/Time   CALCIUM 9.8 01/13/2014 1450   ALKPHOS 56 11/16/2013 0815   AST 24 06/10/2014   ALT 43* 06/10/2014   BILITOT 1.7* 11/16/2013 0815         Impression and Plan:  74 year old gentleman with the following issues:  1. Prostate cancer diagnosed in February 2015. He  presented with a PSA of 6.8 and status post robotic prostatectomy. He is currently under active surveillance with a PSA remains undetectable.  2. 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 He does not have any symptoms at this time and his laboratory testing showed stable white cell count. The plan is to continue with active surveillance and repeat testing in 6 months. I educated him about signs and symptoms of disease progression and right now he exhibits none.    TAEWYB,RKVTX, MD 11/3/20158:52 AM

## 2014-10-04 NOTE — Telephone Encounter (Signed)
gv and printed appt sched and avs for pt for May 2016....added appt per order form

## 2014-10-06 ENCOUNTER — Encounter: Payer: Self-pay | Admitting: Internal Medicine

## 2014-10-06 ENCOUNTER — Ambulatory Visit (INDEPENDENT_AMBULATORY_CARE_PROVIDER_SITE_OTHER): Payer: Medicare Other | Admitting: Internal Medicine

## 2014-10-06 VITALS — BP 126/68 | HR 84 | Ht 72.0 in | Wt 250.1 lb

## 2014-10-06 DIAGNOSIS — G8929 Other chronic pain: Secondary | ICD-10-CM | POA: Diagnosis not present

## 2014-10-06 DIAGNOSIS — R1013 Epigastric pain: Secondary | ICD-10-CM | POA: Diagnosis not present

## 2014-10-06 DIAGNOSIS — R1314 Dysphagia, pharyngoesophageal phase: Secondary | ICD-10-CM
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
## 2014-10-06 NOTE — Progress Notes (Deleted)
Patient ID: [name], male   DOB: 1941-12-01, 73 y.o.   MRN: [id number]

## 2014-10-06 NOTE — Patient Instructions (Signed)
You have been scheduled for an endoscopy. Please follow written instructions given to you at your visit today. If you use inhalers (even only as needed), please bring them with you on the day of your procedure.  I appreciate the opportunity to care for you.  

## 2014-10-07 ENCOUNTER — Encounter (INDEPENDENT_AMBULATORY_CARE_PROVIDER_SITE_OTHER): Payer: Medicare Other | Admitting: Ophthalmology

## 2014-10-07 DIAGNOSIS — H3532 Exudative age-related macular degeneration: Secondary | ICD-10-CM

## 2014-10-07 DIAGNOSIS — H43813 Vitreous degeneration, bilateral: Secondary | ICD-10-CM | POA: Diagnosis not present

## 2014-10-07 DIAGNOSIS — E11329 Type 2 diabetes mellitus with mild nonproliferative diabetic retinopathy without macular edema: Secondary | ICD-10-CM

## 2014-10-07 DIAGNOSIS — E11319 Type 2 diabetes mellitus with unspecified diabetic retinopathy without macular edema: Secondary | ICD-10-CM

## 2014-10-07 DIAGNOSIS — I10 Essential (primary) hypertension: Secondary | ICD-10-CM

## 2014-10-07 DIAGNOSIS — H35033 Hypertensive retinopathy, bilateral: Secondary | ICD-10-CM

## 2014-10-12 ENCOUNTER — Ambulatory Visit (HOSPITAL_COMMUNITY)
Admission: RE | Admit: 2014-10-12 | Discharge: 2014-10-12 | Disposition: A | Discharge status: Home / Self Care | Payer: Medicare Other | Source: Ambulatory Visit | Attending: Internal Medicine | Admitting: Internal Medicine

## 2014-10-12 ENCOUNTER — Encounter (HOSPITAL_COMMUNITY)
Admission: RE | Disposition: A | Discharge status: Home / Self Care | Payer: Self-pay | Source: Ambulatory Visit | Attending: Internal Medicine

## 2014-10-12 ENCOUNTER — Encounter (HOSPITAL_COMMUNITY): Payer: Self-pay | Admitting: *Deleted

## 2014-10-12 DIAGNOSIS — Z87891 Personal history of nicotine dependence: Secondary | ICD-10-CM | POA: Insufficient documentation

## 2014-10-12 DIAGNOSIS — Z8546 Personal history of malignant neoplasm of prostate: Secondary | ICD-10-CM | POA: Insufficient documentation

## 2014-10-12 DIAGNOSIS — G4733 Obstructive sleep apnea (adult) (pediatric): Secondary | ICD-10-CM | POA: Diagnosis not present

## 2014-10-12 DIAGNOSIS — R1013 Epigastric pain: Secondary | ICD-10-CM | POA: Diagnosis not present

## 2014-10-12 DIAGNOSIS — I10 Essential (primary) hypertension: Secondary | ICD-10-CM | POA: Diagnosis not present

## 2014-10-12 DIAGNOSIS — R1310 Dysphagia, unspecified: Secondary | ICD-10-CM | POA: Diagnosis present

## 2014-10-12 DIAGNOSIS — E039 Hypothyroidism, unspecified: Secondary | ICD-10-CM | POA: Insufficient documentation

## 2014-10-12 DIAGNOSIS — K228 Other specified diseases of esophagus: Secondary | ICD-10-CM | POA: Diagnosis not present

## 2014-10-12 DIAGNOSIS — G8929 Other chronic pain: Secondary | ICD-10-CM | POA: Diagnosis not present

## 2014-10-12 DIAGNOSIS — R1314 Dysphagia, pharyngoesophageal phase: Secondary | ICD-10-CM

## 2014-10-12 DIAGNOSIS — K219 Gastro-esophageal reflux disease without esophagitis: Secondary | ICD-10-CM | POA: Insufficient documentation

## 2014-10-12 DIAGNOSIS — E11319 Type 2 diabetes mellitus with unspecified diabetic retinopathy without macular edema: Secondary | ICD-10-CM | POA: Insufficient documentation

## 2014-10-12 DIAGNOSIS — E785 Hyperlipidemia, unspecified: Secondary | ICD-10-CM | POA: Insufficient documentation

## 2014-10-12 HISTORY — PX: ESOPHAGOGASTRODUODENOSCOPY: SHX5428

## 2014-10-12 LAB — GLUCOSE, CAPILLARY: Glucose-Capillary: 156 mg/dL — ABNORMAL HIGH (ref 70–99)

## 2014-10-12 SURGERY — EGD (ESOPHAGOGASTRODUODENOSCOPY)
Anesthesia: Moderate Sedation

## 2014-10-12 MED ORDER — DIPHENHYDRAMINE HCL 50 MG/ML IJ SOLN
INTRAMUSCULAR | Status: AC
  Filled 2014-10-12: qty 1

## 2014-10-12 MED ORDER — SODIUM CHLORIDE 0.9 % IV SOLN
INTRAVENOUS | Status: DC
  Administered 2014-10-12: 500 mL via INTRAVENOUS

## 2014-10-12 MED ORDER — MIDAZOLAM HCL 10 MG/2ML IJ SOLN
INTRAMUSCULAR | Status: AC
  Filled 2014-10-12: qty 2

## 2014-10-12 MED ORDER — MIDAZOLAM HCL 10 MG/2ML IJ SOLN
INTRAMUSCULAR | Status: DC | PRN
  Administered 2014-10-12: 1 mg via INTRAVENOUS
  Administered 2014-10-12 (×2): 2 mg via INTRAVENOUS

## 2014-10-12 MED ORDER — BUTAMBEN-TETRACAINE-BENZOCAINE 2-2-14 % EX AERO
INHALATION_SPRAY | CUTANEOUS | Status: DC | PRN
  Administered 2014-10-12: 2 via TOPICAL

## 2014-10-12 MED ORDER — SUCRALFATE 1 G PO TABS: 1.0000 g | ORAL_TABLET | Freq: Three times a day (TID) | ORAL | Status: DC

## 2014-10-12 MED ORDER — FENTANYL CITRATE 0.05 MG/ML IJ SOLN
INTRAMUSCULAR | Status: DC | PRN
  Administered 2014-10-12 (×2): 25 ug via INTRAVENOUS

## 2014-10-12 MED ORDER — FENTANYL CITRATE 0.05 MG/ML IJ SOLN
INTRAMUSCULAR | Status: AC
  Filled 2014-10-12: qty 2

## 2014-10-12 NOTE — Discharge Instructions (Addendum)
°  I did not see any ulcers or damage. The esophagus may not be squeezing properly - I stretched it to see if that would help you. I have prescribed carafate (sucralfate) to help your stomach feel better - it coats and protects it and the esophagus.  I hope you have a good time on your cruise.  I appreciate the opportunity to care for you. Gatha Mayer, MD, FACG   YOU HAD AN ENDOSCOPIC PROCEDURE TODAY: Refer to the procedure report and other information in the discharge instructions given to you for any specific questions about what was found during the examination. If this information does not answer your questions, please call Dr. Celesta Aver office at [phone] to clarify.   YOU SHOULD EXPECT: Some feelings of bloating in the abdomen. Passage of more gas than usual. Walking can help get rid of the air that was put into your GI tract during the procedure and reduce the bloating. If you had a lower endoscopy (such as a colonoscopy or flexible sigmoidoscopy) you may notice spotting of blood in your stool or on the toilet paper. Some abdominal soreness may be present for a day or two, also.   DIET: Your first meal following the procedure should be clear liquids only until noon then soft foods. Tomorrow it is  ok to progress to your normal diet.     ACTIVITY: Your care partner should take you home directly after the procedure. You should plan to take it easy, moving slowly for the rest of the day. You can resume normal activity the day after the procedure however YOU SHOULD NOT DRIVE, use power tools, machinery or perform tasks that involve climbing or major physical exertion for 24 hours (because of the sedation medicines used during the test).   SYMPTOMS TO REPORT IMMEDIATELY: A gastroenterologist can be reached at any hour. Please call [phone]  for any of the following symptoms: Following upper endoscopy (EGD, EUS, ERCP, esophageal dilation) Vomiting of blood or coffee ground  material  New, significant abdominal pain  New, significant chest pain or pain under the shoulder blades  Painful or persistently difficult swallowing  New shortness of breath  Black, tarry-looking or red, bloody stools

## 2014-10-12 NOTE — Op Note (Signed)
[place], [address]   ENDOSCOPY PROCEDURE REPORT  PATIENT: David, Hoover  MR#: [id number] BIRTHDATE: [birth year] , 73  yrs. old GENDER: male ENDOSCOPIST: Gatha Mayer, MD, [facility] PROCEDURE DATE:  10/12/2014 PROCEDURE:  EGD, diagnostic   + Venia Minks Dilation esophagus ASA CLASS:     Class III INDICATIONS:  dysphagia and epigastric pain. MEDICATIONS: Fentanyl 50 mcg IV and Versed 5 mg IV TOPICAL ANESTHETIC: Cetacaine Spray  DESCRIPTION OF PROCEDURE: After the risks benefits and alternatives of the procedure were thoroughly explained, informed consent was obtained.  The    endoscope was introduced through the mouth and advanced to the second portion of the duodenum , Without limitations.  The instrument was slowly withdrawn as the mucosa was fully examined.    1) Lap band impression without much if any stenosis below GE junction (in cardia) 2) Mildly tortuous esophagus 3) Otherwise normal EGD 4) 54 Fr Maloney dilator passed to 40 cm to treat dysphagia - re-inspection no changes seen.  Retroflexed views revealed as previously described.     The scope was then withdrawn from the patient and the procedure completed.  COMPLICATIONS: There were no immediate complications.  ENDOSCOPIC IMPRESSION: 1) Lap band impression without much if any stenosis below GE junction (in cardia) 2) Mildly tortuous esophagus 3) Otherwise normal EGD 4) 54 Fr Maloney dilator passed to treat dysphagia - re-inspection no changes seen  RECOMMENDATIONS: 1.  Clear liquids until noon , then soft foods rest of day.  Resume prior diet tomorrow. 2.  

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
## 2014-10-12 NOTE — Interval H&P Note (Signed)
History and Physical Interval Note:  10/12/2014 10:13 AM  [name]  has presented today for surgery, with the diagnosis of dysphagia, epigastric pain  The various methods of treatment have been discussed with the patient and family. After consideration of risks, benefits and other options for treatment, the patient has consented to  Procedure(s): ESOPHAGOGASTRODUODENOSCOPY (EGD) (N/A) BALLOON DILATION (N/A) as a surgical intervention .  The patient's history has been reviewed, patient examined, no change in status, stable for surgery.  I have reviewed the patient's chart and labs.  Questions were answered to the patient's satisfaction.     Silvano Rusk

## 2014-10-13 ENCOUNTER — Encounter (HOSPITAL_COMMUNITY): Payer: Self-pay | Admitting: Internal Medicine

## 2014-10-24 ENCOUNTER — Other Ambulatory Visit (INDEPENDENT_AMBULATORY_CARE_PROVIDER_SITE_OTHER): Payer: Medicare Other

## 2014-10-24 DIAGNOSIS — C61 Malignant neoplasm of prostate: Secondary | ICD-10-CM | POA: Diagnosis not present

## 2014-10-24 NOTE — Progress Notes (Signed)
Labs for dr. wrenn  

## 2014-10-25 ENCOUNTER — Telehealth: Payer: Self-pay | Admitting: Family Medicine

## 2014-10-25 LAB — PSA, TOTAL AND FREE
PSA, Free Pct: >10 %
PSA, Free: 0.01 ng/mL
PSA: <0.1 ng/mL (ref 0.0–4.0)

## 2014-10-25 NOTE — Telephone Encounter (Signed)
-----   Message from Chipper Herb, MD sent at 10/25/2014  8:26 AM EST ----- The PSA remains low and unchanged from 3 months ago. Please forward this report to the urologist that is following the patient. Also call the patient.

## 2014-10-25 NOTE — Telephone Encounter (Signed)
Pt aware of results and sees [name] on Dec 2. Labs faxed to [facility]

## 2014-11-01 ENCOUNTER — Telehealth: Payer: Self-pay | Admitting: Family Medicine

## 2014-11-01 NOTE — Telephone Encounter (Signed)
Wife (evon Keesey) states she spoke w/ you concerning pt. Pt needs his concealed weapon permit signed/renewed before the end of the year. Also just a quick look at his meds. Pt has est appt on 12/08/14.

## 2014-11-03 DIAGNOSIS — N393 Stress incontinence (female) (male): Secondary | ICD-10-CM | POA: Diagnosis not present

## 2014-11-03 DIAGNOSIS — C61 Malignant neoplasm of prostate: Secondary | ICD-10-CM | POA: Diagnosis not present

## 2014-11-04 ENCOUNTER — Encounter: Payer: Self-pay | Admitting: Internal Medicine

## 2014-11-04 NOTE — Progress Notes (Signed)
Patient ID: [name], male   DOB: Oct 16, 1941, 73 y.o.   MRN: [id number] Information to support carafate rx faxed to [facility] in [place] , fax # [phone].  Faxed EGD done  10/12/14 along with last office notes and his discharge summary from that day.  AttFreda Munro.

## 2014-11-10 ENCOUNTER — Encounter: Payer: Self-pay | Admitting: Family Medicine

## 2014-11-10 ENCOUNTER — Ambulatory Visit (INDEPENDENT_AMBULATORY_CARE_PROVIDER_SITE_OTHER): Payer: Medicare Other | Admitting: Family Medicine

## 2014-11-10 DIAGNOSIS — I10 Essential (primary) hypertension: Secondary | ICD-10-CM

## 2014-11-10 DIAGNOSIS — F329 Major depressive disorder, single episode, unspecified: Secondary | ICD-10-CM

## 2014-11-10 DIAGNOSIS — F32A Depression, unspecified: Secondary | ICD-10-CM
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
## 2014-11-10 NOTE — Progress Notes (Signed)
Pre visit review using our clinic review tool, if applicable. No additional management support is needed unless otherwise documented below in the visit note. 

## 2014-11-10 NOTE — Assessment & Plan Note (Addendum)
Well controlled on Amlodipine 10mg , losartan 100mg  on home readings but poor control in office. See me back in January and bring home cuff for comparison. Continue current medications.

## 2014-11-10 NOTE — Assessment & Plan Note (Addendum)
Well controlled with remeron with no SI/HI in at least 2 years. PHQ2 of 0.  No mental barrier to carrying concealed weapon. Letter written today. i had assumed at time of visit patient had hypothyroidism on levothyroxine. This may be supplemental for depression he previously suffered from-follow up next visit.

## 2014-11-10 NOTE — Patient Instructions (Signed)
Letter given to patient and instructed follow up in January for establish visit.   Consider bringing BP cuff for comparison.

## 2014-11-17 ENCOUNTER — Other Ambulatory Visit: Payer: Self-pay | Admitting: Internal Medicine

## 2014-11-17 NOTE — Telephone Encounter (Signed)
Yes 60 pills to get him to establish visit

## 2014-11-21 ENCOUNTER — Other Ambulatory Visit: Payer: Self-pay | Admitting: Internal Medicine

## 2014-11-28 ENCOUNTER — Encounter (INDEPENDENT_AMBULATORY_CARE_PROVIDER_SITE_OTHER): Payer: Medicare Other | Admitting: Ophthalmology

## 2014-11-28 DIAGNOSIS — I10 Essential (primary) hypertension: Secondary | ICD-10-CM

## 2014-11-28 DIAGNOSIS — H43813 Vitreous degeneration, bilateral: Secondary | ICD-10-CM

## 2014-11-28 DIAGNOSIS — H3532 Exudative age-related macular degeneration: Secondary | ICD-10-CM | POA: Diagnosis not present

## 2014-11-28 DIAGNOSIS — H35033 Hypertensive retinopathy, bilateral: Secondary | ICD-10-CM | POA: Diagnosis not present

## 2014-11-28 DIAGNOSIS — E11329 Type 2 diabetes mellitus with mild nonproliferative diabetic retinopathy without macular edema: Secondary | ICD-10-CM | POA: Diagnosis not present

## 2014-11-28 DIAGNOSIS — E11319 Type 2 diabetes mellitus with unspecified diabetic retinopathy without macular edema: Secondary | ICD-10-CM | POA: Diagnosis not present

## 2014-12-08 ENCOUNTER — Encounter: Payer: Self-pay | Admitting: Family Medicine

## 2014-12-08 ENCOUNTER — Ambulatory Visit (INDEPENDENT_AMBULATORY_CARE_PROVIDER_SITE_OTHER): Payer: Medicare Other | Admitting: Family Medicine

## 2014-12-08 VITALS — BP 140/70 | Temp 98.4°F | Wt 258.0 lb

## 2014-12-08 DIAGNOSIS — E785 Hyperlipidemia, unspecified: Secondary | ICD-10-CM | POA: Diagnosis not present

## 2014-12-08 DIAGNOSIS — I10 Essential (primary) hypertension: Secondary | ICD-10-CM

## 2014-12-08 DIAGNOSIS — E119 Type 2 diabetes mellitus without complications: Secondary | ICD-10-CM

## 2014-12-08 DIAGNOSIS — E039 Hypothyroidism, unspecified: Secondary | ICD-10-CM | POA: Insufficient documentation

## 2014-12-08 DIAGNOSIS — K219 Gastro-esophageal reflux disease without esophagitis: Secondary | ICD-10-CM | POA: Insufficient documentation

## 2014-12-08 NOTE — Patient Instructions (Addendum)
[facility]-ask [facility] to send [place] a copy  Check a1c today  Check in 4 months as long as a1c is ok.

## 2014-12-08 NOTE — Assessment & Plan Note (Signed)
Recommended statin for triglycerides 200-500 but patient refuses.

## 2014-12-08 NOTE — Assessment & Plan Note (Signed)
Previously well controlled on Lantus 50 units, novolog 40 units 3x a day with meals. By reported CBGs, I am concerned patient is not well controlled. Also, He has occaisonal lows from taking "too much" when he decides to give himself extra. I advised him to not give himself extra but to work with [place] to titrateinsulin.

## 2014-12-08 NOTE — Progress Notes (Signed)
David Reddish, MD Phone: [phone]  Subjective:  Patient presents today to establish care with me as their new primary care provider. Patient was formerly a patient of Dr. Leanne Chang. Chief complaint-noted.   DIABETES Type II-controlled previously  Lab Results  Component Value Date   HGBA1C 6.7* 06/10/2014   HGBA1C 6.7* 05/16/2014   HGBA1C 6.2 11/16/2013  Medications taking and tolerating-yes, mornings 145-150. Before lunch- usually over 200, before dinner - 150-200, bedtime 150-200.  Low around 50 within last few weeks. Put too much insulin in.   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 no Vision changes. Endorses hypoglycemia once over last month  Hypertension-mild poor control  BP Readings from Last 3 Encounters:  12/08/14 140/70  11/10/14 152/78  10/12/14 159/91  Home BP monitoring-no Compliant with medications-yes without side effects ROS-Denies any CP, HA, SOB, blurry vision, LE edema  Hyperlipidemia-LDL controlled, triglyerides poor control  Lab Results  Component Value Date   LDLCALC 55 06/10/2014   On statin: no Regular exercise: no, advised ROS- no chest pain or shortness of breath. No myalgias  The following were reviewed and entered/updated in epic: Past Medical History  Diagnosis Date  . Diabetes mellitus   . Diverticulosis of colon   . GERD (gastroesophageal reflux disease)   . Hyperlipidemia   . Hypertension   . OSA (obstructive sleep apnea)   . Colon polyps   . Elevated PSA   . Diabetic retinopathy   . Hypothyroidism   . IBS (irritable bowel syndrome)   . Macular degeneration     followed by ophthalmology  . Arthritis   . PONV (postoperative nausea and vomiting)   . Depression     PTSD  . DDD (degenerative disc disease), lumbar   . DDD (degenerative disc disease), lumbar     last lumbar steroid injection 11/24/13  . Prostate cancer   . PTSD  (post-traumatic stress disorder)     [facility]  . Leukemia 01/2014  . ERECTILE DYSFUNCTION 12/30/2007    No rx.      Patient Active Problem List   Diagnosis Date Noted  . Prostate cancer 01/24/2014    Priority: High  . Diabetes mellitus type 2, controlled 09/24/2007    Priority: High  . Hypothyroidism     Priority: Medium  . Chronic lymphocytic leukemia 05/16/2014    Priority: Medium  . 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 Depression 09/21/2009    Priority: Medium  . Hyperlipidemia 09/24/2007    Priority: Medium  . OSA on CPAP 09/24/2007    Priority: Medium  . Essential hypertension 09/24/2007    Priority: Medium  . GERD (gastroesophageal reflux disease) 12/08/2014  . Obesity 10/25/2008  . History of colonic polyps 08/22/2008  . Diabetic retinopathy 09/24/2007   Past Surgical History  Procedure Laterality Date  . Orif tibia fracture Right   . Cholecystectomy    . Hemorrhoid surgery    . Laparoscopic gastric banding  03/05/11    weight loss  . Colonoscopy  03/25/12, 09/28/08  . Esophagogastroduodenoscopy endoscopy  09/28/08  . Penile prosthesis implant    . Laparoscopic gastric banding with hiatal hernia repair  03/05/2011  . Foot surgery Bilateral   . Shoulder surgery Right 2011  . Vasectomy    . Robot assisted laparoscopic radical prostatectomy N/A 01/24/2014    Procedure: ROBOTIC ASSISTED LAPAROSCOPIC RADICAL  PROSTATECTOMY LEVEL 3;  Surgeon: Dutch Gray, MD;  Location: [facility];  Service: Urology;  Laterality: N/A;  . Lymphadenectomy Bilateral 01/24/2014    Procedure: LYMPHADENECTOMY;  Surgeon: Dutch Gray, MD;  Location: [facility];  Service: Urology;  Laterality: Bilateral;  . Tonsillectomy      age 31  . Prostate surgery  01/2014  . Esophagogastroduodenoscopy N/A 10/12/2014    Procedure: ESOPHAGOGASTRODUODENOSCOPY (EGD);  Surgeon: Gatha Mayer, MD;  Location: [facility];  Service: Endoscopy;  Laterality: N/A;    Family History  Problem Relation  Age of Onset  . Hypertension Mother   . Stomach cancer Mother 69  . Alcohol abuse Brother   . Hypertension Paternal Uncle   . Heart attack Paternal Uncle   . Colon cancer Neg Hx   . Colon polyps Neg Hx   . Rectal cancer Neg Hx     Medications- reviewed and updated Current Outpatient Prescriptions  Medication Sig Dispense Refill  . amLODipine (NORVASC) 10 MG tablet Take 10 mg by mouth every morning.    . insulin aspart (NOVOLOG) 100 UNIT/ML injection Inject 40 Units into the skin 3 (three) times daily with meals. When sugar is elevated    . insulin glargine (LANTUS) 100 UNIT/ML injection Inject 50 Units into the skin at bedtime.     . lansoprazole (PREVACID) 30 MG capsule Take 30 mg by mouth daily at 12 noon.    Marland Kitchen levothyroxine (SYNTHROID, LEVOTHROID) 50 MCG tablet Take 50 mcg by mouth daily before breakfast.     . losartan (COZAAR) 100 MG tablet Take 100 mg by mouth every morning.    . mirtazapine (REMERON) 15 MG tablet Take 15 mg by mouth at bedtime.    . ONE TOUCH ULTRA TEST test strip Test 4 times daily 100 each 11  . potassium chloride SA (K-DUR,KLOR-CON) 20 MEQ tablet Take 20 mEq by mouth daily.    . sucralfate (CARAFATE) 1 G tablet Take 1 tablet (1 g total) by mouth 3 (three) times daily with meals. 90 tablet 5  . traMADol (ULTRAM) 50 MG tablet TAKE ONE TABLET BY MOUTH EVERY 6 HOURS AS NEEDED (Patient not taking: Reported on 12/08/2014) 60 tablet 5   No current facility-administered medications for this visit.    Allergies-reviewed and updated Allergies  Allergen Reactions  . Morphine Sulfate     REACTION: rash: itching    History   Social History  . Marital Status: Married    Spouse Name: N/A    Number of Children: 68  . Years of Education: N/A   Occupational History  . retired    Social History Main Topics  . Smoking status: Former Smoker -- 1.00 packs/day for 20 years    Types: Cigarettes    Quit date: 02/25/1981  . Smokeless tobacco: Never Used  . Alcohol  Use: No  . Drug Use: No  . Sexual Activity: Yes     Comment: Been able to utilize his penile prosthesis successfully   Other Topics Concern  . None   Social History Narrative   Married. 3 children from previous marriage, 2 step children. 15 grandkids.       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  Ext: trace edema Skin: warm, dry, no rash  Neuro: grossly normal, moves all extremities, PERRLA  Diabetic Foot Exam - Simple   Simple Foot Form  Diabetic Foot exam was performed with the following findings:  Yes 12/08/2014 11:43 PM  Visual Inspection  No deformities, no ulcerations, no other skin breakdown bilaterally:  Yes  Sensation Testing  See comments:  Yes  Pulse Check  Posterior Tibialis and Dorsalis pulse intact bilaterally:  Yes  Comments  Monofilament with no sensation until the ankle bilaterally. Wearing diabetic shoes.      Assessment/Plan:  Diabetes mellitus type 2, controlled Previously well controlled on Lantus 50 units, novolog 40 units 3x a day with meals. By reported CBGs, I am concerned patient is not well controlled. Also, He has occaisonal lows from taking "too much" when he decides to give himself extra. I advised him to not give himself extra but to work with [place] to titrateinsulin.   Essential hypertension Mild poor diastolic control but improved form last 2 visits. If remains 140 or greater on follow up-would plan on addition of medication such as hctz.   Hyperlipidemia Recommended statin for triglycerides 200-500 but patient refuses.    Return precautions advised. 4 month follow up planned. Hopeful  [facility] [place] [facility] of next [facility] exam. ROI at next visit if have not received.   Orders Placed This Encounter  Procedures  . Hemoglobin A1c

## 2014-12-08 NOTE — Assessment & Plan Note (Signed)
Mild poor diastolic control but improved form last 2 visits. If remains 140 or greater on follow up-would plan on addition of medication such as hctz.

## 2014-12-09 LAB — HEMOGLOBIN A1C: Hgb A1c MFr Bld: 6.8 % — ABNORMAL HIGH (ref 4.6–6.5)

## 2014-12-15 ENCOUNTER — Telehealth: Payer: Self-pay | Admitting: *Deleted

## 2014-12-15 NOTE — Telephone Encounter (Signed)
On 12-15-14 mail consult note to pt ok per Dr. Tammi Klippel.

## 2014-12-27 DIAGNOSIS — M5136 Other intervertebral disc degeneration, lumbar region: Secondary | ICD-10-CM | POA: Diagnosis not present

## 2014-12-27 DIAGNOSIS — M5030 Other cervical disc degeneration, unspecified cervical region: Secondary | ICD-10-CM | POA: Diagnosis not present

## 2014-12-29 ENCOUNTER — Encounter (INDEPENDENT_AMBULATORY_CARE_PROVIDER_SITE_OTHER): Payer: Medicare Other | Admitting: Ophthalmology

## 2014-12-29 DIAGNOSIS — H2513 Age-related nuclear cataract, bilateral: Secondary | ICD-10-CM

## 2014-12-29 DIAGNOSIS — H35372 Puckering of macula, left eye: Secondary | ICD-10-CM | POA: Diagnosis not present

## 2014-12-29 DIAGNOSIS — H43813 Vitreous degeneration, bilateral: Secondary | ICD-10-CM

## 2014-12-29 DIAGNOSIS — H3532 Exudative age-related macular degeneration: Secondary | ICD-10-CM

## 2014-12-29 DIAGNOSIS — I10 Essential (primary) hypertension: Secondary | ICD-10-CM

## 2014-12-29 DIAGNOSIS — E11319 Type 2 diabetes mellitus with unspecified diabetic retinopathy without macular edema: Secondary | ICD-10-CM

## 2014-12-29 DIAGNOSIS — H35033 Hypertensive retinopathy, bilateral: Secondary | ICD-10-CM

## 2014-12-30 ENCOUNTER — Encounter (INDEPENDENT_AMBULATORY_CARE_PROVIDER_SITE_OTHER): Payer: Medicare Other | Admitting: Ophthalmology

## 2015-01-23 ENCOUNTER — Encounter: Payer: Self-pay | Admitting: Family Medicine

## 2015-01-23 ENCOUNTER — Encounter (INDEPENDENT_AMBULATORY_CARE_PROVIDER_SITE_OTHER): Payer: Medicare Other | Admitting: Ophthalmology

## 2015-01-23 DIAGNOSIS — H43813 Vitreous degeneration, bilateral: Secondary | ICD-10-CM | POA: Diagnosis not present

## 2015-01-23 DIAGNOSIS — H2513 Age-related nuclear cataract, bilateral: Secondary | ICD-10-CM

## 2015-01-23 DIAGNOSIS — H35033 Hypertensive retinopathy, bilateral: Secondary | ICD-10-CM

## 2015-01-23 DIAGNOSIS — I10 Essential (primary) hypertension: Secondary | ICD-10-CM

## 2015-01-23 DIAGNOSIS — E11319 Type 2 diabetes mellitus with unspecified diabetic retinopathy without macular edema: Secondary | ICD-10-CM | POA: Diagnosis not present

## 2015-01-23 DIAGNOSIS — H3532 Exudative age-related macular degeneration: Secondary | ICD-10-CM

## 2015-01-23 DIAGNOSIS — E11329 Type 2 diabetes mellitus with mild nonproliferative diabetic retinopathy without macular edema: Secondary | ICD-10-CM

## 2015-01-23 LAB — HM DIABETES EYE EXAM

## 2015-01-26 DIAGNOSIS — Z8546 Personal history of malignant neoplasm of prostate: Secondary | ICD-10-CM | POA: Diagnosis not present

## 2015-01-26 DIAGNOSIS — N393 Stress incontinence (female) (male): Secondary | ICD-10-CM | POA: Diagnosis not present

## 2015-01-30 ENCOUNTER — Encounter (INDEPENDENT_AMBULATORY_CARE_PROVIDER_SITE_OTHER): Payer: Medicare Other | Admitting: Ophthalmology

## 2015-02-16 DIAGNOSIS — N393 Stress incontinence (female) (male): Secondary | ICD-10-CM | POA: Diagnosis not present

## 2015-02-16 DIAGNOSIS — Z8546 Personal history of malignant neoplasm of prostate: Secondary | ICD-10-CM | POA: Diagnosis not present

## 2015-02-16 DIAGNOSIS — N529 Male erectile dysfunction, unspecified: Secondary | ICD-10-CM | POA: Diagnosis not present

## 2015-02-20 ENCOUNTER — Other Ambulatory Visit: Payer: Self-pay | Admitting: Dermatology

## 2015-02-20 DIAGNOSIS — D239 Other benign neoplasm of skin, unspecified: Secondary | ICD-10-CM | POA: Diagnosis not present

## 2015-02-20 DIAGNOSIS — L821 Other seborrheic keratosis: Secondary | ICD-10-CM | POA: Diagnosis not present

## 2015-02-20 DIAGNOSIS — D485 Neoplasm of uncertain behavior of skin: Secondary | ICD-10-CM | POA: Diagnosis not present

## 2015-02-20 DIAGNOSIS — L570 Actinic keratosis: Secondary | ICD-10-CM | POA: Diagnosis not present

## 2015-03-20 ENCOUNTER — Encounter (INDEPENDENT_AMBULATORY_CARE_PROVIDER_SITE_OTHER): Payer: Medicare Other | Admitting: Ophthalmology

## 2015-03-20 DIAGNOSIS — H3531 Nonexudative age-related macular degeneration: Secondary | ICD-10-CM | POA: Diagnosis not present

## 2015-03-20 DIAGNOSIS — E11329 Type 2 diabetes mellitus with mild nonproliferative diabetic retinopathy without macular edema: Secondary | ICD-10-CM | POA: Diagnosis not present

## 2015-03-20 DIAGNOSIS — H3532 Exudative age-related macular degeneration: Secondary | ICD-10-CM

## 2015-03-20 DIAGNOSIS — H43813 Vitreous degeneration, bilateral: Secondary | ICD-10-CM | POA: Diagnosis not present

## 2015-03-20 DIAGNOSIS — E11319 Type 2 diabetes mellitus with unspecified diabetic retinopathy without macular edema: Secondary | ICD-10-CM

## 2015-03-20 DIAGNOSIS — I10 Essential (primary) hypertension: Secondary | ICD-10-CM | POA: Diagnosis not present

## 2015-03-20 DIAGNOSIS — H2513 Age-related nuclear cataract, bilateral: Secondary | ICD-10-CM

## 2015-03-20 DIAGNOSIS — H35033 Hypertensive retinopathy, bilateral: Secondary | ICD-10-CM | POA: Diagnosis not present

## 2015-03-24 ENCOUNTER — Other Ambulatory Visit: Payer: Self-pay

## 2015-03-29 DIAGNOSIS — M5082 Other cervical disc disorders, mid-cervical region: Secondary | ICD-10-CM | POA: Diagnosis not present

## 2015-03-29 DIAGNOSIS — M5136 Other intervertebral disc degeneration, lumbar region: Secondary | ICD-10-CM | POA: Diagnosis not present

## 2015-04-03 ENCOUNTER — Ambulatory Visit (INDEPENDENT_AMBULATORY_CARE_PROVIDER_SITE_OTHER): Payer: Medicare Other | Admitting: Family Medicine

## 2015-04-03 ENCOUNTER — Encounter: Payer: Self-pay | Admitting: Family Medicine

## 2015-04-03 VITALS — BP 140/80 | HR 68 | Temp 98.3°F | Wt 256.0 lb

## 2015-04-03 DIAGNOSIS — E119 Type 2 diabetes mellitus without complications: Secondary | ICD-10-CM

## 2015-04-03 DIAGNOSIS — E038 Other specified hypothyroidism: Secondary | ICD-10-CM

## 2015-04-03 DIAGNOSIS — E034 Atrophy of thyroid (acquired): Secondary | ICD-10-CM

## 2015-04-03 DIAGNOSIS — I10 Essential (primary) hypertension: Secondary | ICD-10-CM | POA: Diagnosis not present

## 2015-04-03 LAB — TSH: TSH: 2.13 u[IU]/mL (ref 0.35–4.50)

## 2015-04-03 LAB — HEMOGLOBIN A1C: Hgb A1c MFr Bld: 6.6 % — ABNORMAL HIGH (ref 4.6–6.5)

## 2015-04-03 MED ORDER — LOSARTAN POTASSIUM-HCTZ 100-12.5 MG PO TABS: 1.0000 | ORAL_TABLET | Freq: Every day | ORAL | Status: DC

## 2015-04-03 NOTE — Assessment & Plan Note (Signed)
CBGs sound elevated but will await a1c. If a1c >7, likely increase lantus and keep mealtime 40 units given current ratio 50:120 long to short

## 2015-04-03 NOTE — Assessment & Plan Note (Signed)
Controlled on synthroid 74mcg. Check TSH today.

## 2015-04-03 NOTE — Assessment & Plan Note (Signed)
Mild poor control. Add hctz 12.5mg  to Amlodipine 10mg , losartan 100mg . [facility]. He will consider seeing me in a few weeks but will at least see me in 4 months.

## 2015-04-03 NOTE — Progress Notes (Signed)
[name], MD Subjective:   [name] is a 74 y.o. year old very pleasant male patient who presents with:  Diabetes Mellitus poor control -this AM 174, usually around 140. Usually over 200 after meals. 50 units lantus, 40-50 units novolog before meals, never had lows on 40mg  but occasionally <80 slightly if uses 50 units aspart ROS- occasional hypogclyemia, no blurry vision  Hypertension-continued poor control, not sure if [facility] will [facility] updated BP meds  BP Readings from Last 3 Encounters:  04/03/15 140/80  12/08/14 140/70  11/10/14 152/78   Home BP monitoring-yes occasionally and usually in 140s Compliant with medications-yes without side effects ROS-Denies any CP, HA, SOB.   Hypothyroidism-controlled previously  Lab Results  Component Value Date   TSH 1.59 06/10/2014   On thyroid medication-50 mcg ROS-No hair or nail changes. No heat/cold intolerance. No constipation or diarrhea. Denies shakiness or anxiety.   Past Medical History- prostate cancer followed by [name], CLLL followed by oncology  Medications- reviewed and updated Current Outpatient Prescriptions  Medication Sig Dispense Refill  . amLODipine (NORVASC) 10 MG tablet Take 10 mg by mouth every morning.    . insulin aspart (NOVOLOG) 100 UNIT/ML injection Inject 40 Units into the skin 3 (three) times daily with meals. When sugar is elevated    . insulin glargine (LANTUS) 100 UNIT/ML injection Inject 50 Units into the skin at bedtime.     . lansoprazole (PREVACID) 30 MG capsule Take 30 mg by mouth daily at 12 noon.    Marland Kitchen levothyroxine (SYNTHROID, LEVOTHROID) 50 MCG tablet Take 50 mcg by mouth daily before breakfast.     . losartan (COZAAR) 100 MG tablet Take 100 mg by mouth every morning.    . mirtazapine (REMERON) 15 MG tablet Take 15 mg by mouth at bedtime.    . ONE TOUCH ULTRA TEST test strip Test 4 times daily 100 each 11  . potassium chloride SA (K-DUR,KLOR-CON) 20 MEQ tablet Take 20 mEq by mouth daily.     . sucralfate (CARAFATE) 1 G tablet Take 1 tablet (1 g total) by mouth 3 (three) times daily with meals. 90 tablet 5  . traMADol (ULTRAM) 50 MG tablet TAKE ONE TABLET BY MOUTH EVERY 6 HOURS AS NEEDED (Patient not taking: Reported on 12/08/2014) 60 tablet 5   No current facility-administered medications for this visit.   Objective: BP 140/80 mmHg  Pulse 68  Temp(Src) 98.3 F (36.8 C)  Wt 256 lb (116.121 kg) Gen: NAD, resting comfortably in chair, somewhat hard of hearing CV: RRR no murmurs rubs or gallops Lungs: CTAB no crackles, wheeze, rhonchi Abdomen: soft/nontender/nondistended/normal bowel sounds. obese Ext: 1+ edema Skin: warm, dry, no rash Neuro: grossly normal, moves all extremities, normal gait   Assessment/Plan:  Diabetes mellitus type 2, controlled CBGs sound elevated but will await a1c. If a1c >7, likely increase lantus and keep mealtime 40 units given current ratio 50:120 long to short   Essential hypertension Mild poor control. Add hctz 12.5mg  to Amlodipine 10mg , losartan 100mg . [facility]. He will consider seeing me in a few weeks but will at least see me in 4 months.    Hypothyroidism Controlled on synthroid 22mcg. Check TSH today.     Orders Placed This Encounter  Procedures  . Hemoglobin A1c  . TSH    [place]    Meds ordered this encounter  Medications  . losartan-hydrochlorothiazide (HYZAAR) 100-12.5 MG per tablet    Sig: Take 1 tablet by  mouth daily.    Dispense:  90 tablet    Refill:  3

## 2015-04-03 NOTE — Patient Instructions (Addendum)
Check a1c, may adjust insulin based on results if >7.   Change losartan 100mg  to a new pill losartan-hydrochlorothiazide 100-12.5mg  since blood pressure is riding high. I would be happy to see you back in a few weeks for a recheck on medicine if you are willing.   Check thyroid  Oncology will check rest of blood work  See me in 4 months

## 2015-04-04 ENCOUNTER — Ambulatory Visit (HOSPITAL_BASED_OUTPATIENT_CLINIC_OR_DEPARTMENT_OTHER): Payer: Medicare Other | Admitting: Oncology

## 2015-04-04 ENCOUNTER — Encounter (INDEPENDENT_AMBULATORY_CARE_PROVIDER_SITE_OTHER): Payer: Medicare Other | Admitting: Ophthalmology

## 2015-04-04 ENCOUNTER — Other Ambulatory Visit (HOSPITAL_BASED_OUTPATIENT_CLINIC_OR_DEPARTMENT_OTHER): Payer: Medicare Other

## 2015-04-04 ENCOUNTER — Telehealth: Payer: Self-pay | Admitting: Oncology

## 2015-04-04 VITALS — BP 155/83 | HR 82 | Temp 98.4°F | Resp 18 | Ht 72.0 in | Wt 254.3 lb

## 2015-04-04 DIAGNOSIS — H2513 Age-related nuclear cataract, bilateral: Secondary | ICD-10-CM

## 2015-04-04 DIAGNOSIS — I10 Essential (primary) hypertension: Secondary | ICD-10-CM

## 2015-04-04 DIAGNOSIS — C61 Malignant neoplasm of prostate: Secondary | ICD-10-CM

## 2015-04-04 DIAGNOSIS — C9110 Chronic lymphocytic leukemia of B-cell type not having achieved remission: Secondary | ICD-10-CM

## 2015-04-04 DIAGNOSIS — H3531 Nonexudative age-related macular degeneration: Secondary | ICD-10-CM

## 2015-04-04 DIAGNOSIS — E11329 Type 2 diabetes mellitus with mild nonproliferative diabetic retinopathy without macular edema: Secondary | ICD-10-CM | POA: Diagnosis not present

## 2015-04-04 DIAGNOSIS — H35033 Hypertensive retinopathy, bilateral: Secondary | ICD-10-CM | POA: Diagnosis not present

## 2015-04-04 DIAGNOSIS — H43813 Vitreous degeneration, bilateral: Secondary | ICD-10-CM | POA: Diagnosis not present

## 2015-04-04 DIAGNOSIS — H3532 Exudative age-related macular degeneration: Secondary | ICD-10-CM | POA: Diagnosis not present

## 2015-04-04 DIAGNOSIS — E11319 Type 2 diabetes mellitus with unspecified diabetic retinopathy without macular edema: Secondary | ICD-10-CM | POA: Diagnosis not present

## 2015-04-04 LAB — CBC WITH DIFFERENTIAL/PLATELET
BASO%: 0.7 % (ref 0.0–2.0)
Basophils Absolute: 0.1 10*3/uL (ref 0.0–0.1)
EOS%: 2.3 % (ref 0.0–7.0)
Eosinophils Absolute: 0.2 10*3/uL (ref 0.0–0.5)
HCT: 45.8 % (ref 38.4–49.9)
HGB: 15.3 g/dL (ref 13.0–17.1)
LYMPH%: 32.8 % (ref 14.0–49.0)
MCH: 27.7 pg (ref 27.2–33.4)
MCHC: 33.5 g/dL (ref 32.0–36.0)
MCV: 82.7 fL (ref 79.3–98.0)
MONO#: 0.8 10*3/uL (ref 0.1–0.9)
MONO%: 7.3 % (ref 0.0–14.0)
NEUT#: 5.9 10*3/uL (ref 1.5–6.5)
NEUT%: 56.9 % (ref 39.0–75.0)
Platelets: 210 10*3/uL (ref 140–400)
RBC: 5.54 10*6/uL (ref 4.20–5.82)
RDW: 14.6 % (ref 11.0–14.6)
WBC: 10.4 10*3/uL — ABNORMAL HIGH (ref 4.0–10.3)
lymph#: 3.4 10*3/uL — ABNORMAL HIGH (ref 0.9–3.3)

## 2015-04-04 LAB — COMPREHENSIVE METABOLIC PANEL (CC13)
ALT: 39 U/L (ref 0–55)
AST: 21 U/L (ref 5–34)
Albumin: 4 g/dL (ref 3.5–5.0)
Alkaline Phosphatase: 55 U/L (ref 40–150)
Anion Gap: 9 mEq/L (ref 3–11)
BUN: 17.8 mg/dL (ref 7.0–26.0)
CO2: 26 mEq/L (ref 22–29)
Calcium: 9 mg/dL (ref 8.4–10.4)
Chloride: 104 mEq/L (ref 98–109)
Creatinine: 1 mg/dL (ref 0.7–1.3)
EGFR: 78 mL/min/{1.73_m2} — ABNORMAL LOW (ref >90)
Glucose: 227 mg/dl — ABNORMAL HIGH (ref 70–140)
Potassium: 3.8 mEq/L (ref 3.5–5.1)
Sodium: 138 mEq/L (ref 136–145)
Total Bilirubin: 1.65 mg/dL — ABNORMAL HIGH (ref 0.20–1.20)
Total Protein: 6.4 g/dL (ref 6.4–8.3)

## 2015-04-04 NOTE — Telephone Encounter (Signed)
per pof to sch pt appt-gave pt copy of sch °

## 2015-04-04 NOTE — Progress Notes (Signed)
Hematology and Oncology Follow Up Visit  [name] [id number] 09-13-1941 74 y.o. 04/04/2015 9:21 AM [name], MDHunter, David Mars, MD   Principle Diagnosis: 74 year old gentleman with prostate cancer diagnosed in February 2015 he had a Gleason score 3+4 = 7 clinical stage TIc and a PSA of 6.8. 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   Prior Therapy: he is status post robotic prostatectomy in February 2015 with the pathology revealed a Gleason score 3+4 = 7 without any evidence of extraprostatic extension.  Current therapy: Observation and surveillance.  Interim History:  [name] presents today for a follow-up visit with his wife.since the last visit, he continues to do relatively well. He does have issues with balance but no falls reported. He did have an injection and his eye today for macular degeneration. He has reported lower abdominal and pelvic discomfort at times but spontaneously resolves. He is not reporting any urinary symptoms or constitutional symptoms. He does not report any fevers or chills or sweats. Does not report any chest pain or palpitation. His last PSA remains undetectable. He does not report any headaches, blurry vision or syncope. He does not report any nausea, vomiting, change in his bowel habits. He does not report any skeletal complaints of arthralgias or myalgias. Rest of his review of systems unremarkable.  Medications: I have reviewed the patient's current medications.  Current Outpatient Prescriptions  Medication Sig Dispense Refill  . amLODipine (NORVASC) 10 MG tablet Take 10 mg by mouth every morning.    . insulin aspart (NOVOLOG) 100 UNIT/ML injection Inject 40 Units into the skin 3 (three) times daily with meals. When sugar is elevated    . insulin glargine (LANTUS) 100 UNIT/ML injection Inject 50 Units into the skin at bedtime.     . lansoprazole (PREVACID) 30 MG capsule Take 30 mg by mouth daily at 12  noon.    Marland Kitchen levothyroxine (SYNTHROID, LEVOTHROID) 50 MCG tablet Take 50 mcg by mouth daily before breakfast.     . losartan-hydrochlorothiazide (HYZAAR) 100-12.5 MG per tablet Take 1 tablet by mouth daily. 90 tablet 3  . mirtazapine (REMERON) 15 MG tablet Take 15 mg by mouth at bedtime.    . ONE TOUCH ULTRA TEST test strip Test 4 times daily 100 each 11  . potassium chloride SA (K-DUR,KLOR-CON) 20 MEQ tablet Take 20 mEq by mouth daily.    . sucralfate (CARAFATE) 1 G tablet Take 1 tablet (1 g total) by mouth 3 (three) times daily with meals. 90 tablet 5  . traMADol (ULTRAM) 50 MG tablet TAKE ONE TABLET BY MOUTH EVERY 6 HOURS AS NEEDED 60 tablet 5   No current facility-administered medications for this visit.     Allergies:  Allergies  Allergen Reactions  . Morphine Sulfate Itching and Rash    Past Medical History, Surgical history, Social history, and Family History were reviewed and updated.   Blood pressure 155/83, pulse 82, temperature 98.4 F (36.9 C), temperature source Oral, resp. rate 18, height 6' (1.829 m), weight 254 lb 4.8 oz (115.35 kg), SpO2 97 %. ECOG: 1 General appearance: alert and cooperative appeared in no active distress. Head: Normocephalic, without obvious abnormality Neck: no adenopathy Lymph nodes: Cervical, supraclavicular, and axillary nodes normal. Heart:regular rate and rhythm, S1, S2 normal, no murmur, click, rub or gallop Lung:chest clear, no wheezing, rales, normal symmetric air entry. Abdomin: soft, non-tender, without masses or organomegaly EXT:no erythema, induration, or nodules   Lab Results: Lab  Results  Component Value Date   WBC 10.4* 04/04/2015   HGB 15.3 04/04/2015   HCT 45.8 04/04/2015   MCV 82.7 04/04/2015   PLT 210 04/04/2015     Chemistry      Component Value Date/Time   NA 142 10/04/2014 0752   NA 139 06/10/2014   NA 142 01/13/2014 1450   K 3.9 10/04/2014 0752   K 3.8 06/10/2014   CL 100 01/13/2014 1450   CO2 26 10/04/2014  0752   CO2 27 01/13/2014 1450   BUN 14.4 10/04/2014 0752   BUN 13 06/10/2014   BUN 16 01/13/2014 1450   CREATININE 1.1 10/04/2014 0752   CREATININE 1.0 06/10/2014   CREATININE 1.01 01/13/2014 1450   GLU 118 06/10/2014      Component Value Date/Time   CALCIUM 9.9 10/04/2014 0752   CALCIUM 9.8 01/13/2014 1450   ALKPHOS 53 10/04/2014 0752   ALKPHOS 56 11/16/2013 0815   AST 23 10/04/2014 0752   AST 24 06/10/2014   ALT 41 10/04/2014 0752   ALT 43* 06/10/2014   BILITOT 0.86 10/04/2014 0752   BILITOT 1.7* 11/16/2013 0815         Impression and Plan:  74 year old gentleman with the following issues:  1. Prostate cancer diagnosed in February 2015. He presented with a PSA of 6.8 and status post robotic prostatectomy. He is currently under active surveillance with a PSA remains undetectable.  2. 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 He does not have any symptoms at this time and his laboratory testing showed stable white cell count. I see no indication for systemic treatment were restaging at this time. We will continue active surveillance and repeat laboratory testing and a physical examination 6 months.    [facility], MD 5/3/20169:21 AM

## 2015-04-06 ENCOUNTER — Encounter (INDEPENDENT_AMBULATORY_CARE_PROVIDER_SITE_OTHER): Payer: Medicare Other | Admitting: Ophthalmology

## 2015-04-14 ENCOUNTER — Encounter: Payer: Self-pay | Admitting: Family Medicine

## 2015-04-14 ENCOUNTER — Encounter: Payer: Self-pay | Admitting: *Deleted

## 2015-04-14 ENCOUNTER — Telehealth: Payer: Self-pay | Admitting: *Deleted

## 2015-04-14 ENCOUNTER — Telehealth: Payer: Self-pay | Admitting: Family Medicine

## 2015-04-14 NOTE — Telephone Encounter (Signed)
Left message on answering machine for patient to call me re: requested letter from dr Alen Blew.

## 2015-04-14 NOTE — Telephone Encounter (Signed)
PT. STATES [facility] HAS HIM 100% DISABILITY WITH HIS CLL BUT STILL HAVE HIM "EMPLOYABLE". PT. WOULD LIKE A LETTER WHICH STATES HE IS "UNEMPLOYABLE". HE HAS AN APPOINTMENT WITH THE [facility] ON Wednesday,04/19/15, AT 10:00AM. PT. WOULD LIKE TO PICK UP THE LETTER BEFORE Wednesday.

## 2015-04-14 NOTE — Telephone Encounter (Signed)
Pt returned your call.  

## 2015-04-14 NOTE — Telephone Encounter (Signed)
Returned pt call, and lvm for pt tcb.

## 2015-04-14 NOTE — Telephone Encounter (Signed)
Mrs. [name] will schedule pt to come in and discuss this letter per Dr. Yong Channel.

## 2015-04-14 NOTE — Telephone Encounter (Signed)
Per dr Alen Blew, CLL diagnosis has no effect on employee status. Unable to write letter stating he is unemployable

## 2015-04-14 NOTE — Telephone Encounter (Signed)
Pt called and ask that someone call pt back. He has some questions about a note that he will need to take to the [place] . Pt said he sent an email to Dr Yong Channel

## 2015-04-17 ENCOUNTER — Encounter: Payer: Self-pay | Admitting: Family Medicine

## 2015-04-17 ENCOUNTER — Ambulatory Visit (INDEPENDENT_AMBULATORY_CARE_PROVIDER_SITE_OTHER): Payer: Medicare Other | Admitting: Family Medicine

## 2015-04-17 VITALS — BP 110/78 | HR 96 | Temp 98.3°F | Wt 250.0 lb

## 2015-04-17 DIAGNOSIS — I739 Peripheral vascular disease, unspecified: Secondary | ICD-10-CM | POA: Diagnosis not present

## 2015-04-17 DIAGNOSIS — R29898 Other symptoms and signs involving the musculoskeletal system: Secondary | ICD-10-CM

## 2015-04-17 NOTE — Patient Instructions (Addendum)
Check with Dr. Nelva Bush to see if he thinks this leg tiredness is coming form the back, have him send me his records. I am also going to refer you for blood flow studies to make sure this is not coming from your arteries.   Letter for [facility] given

## 2015-04-17 NOTE — Progress Notes (Signed)
[name], MD  Subjective:  [name] is a 74 y.o. year old very pleasant male patient who presents with:  Leg weakness-new -Patient with known neuropathy in feet thought diabetes related. Has had some issues with stumbling/tripping around the house for some time. using walker at times as well as cane  Over 6-12 months though Legs seem to get tired with walking any significant distance. He gets a soreness throughout his legs. Not on statin. Known lumabar disc issues. Low back pain receives injections from Dr. Nelva Bush. Does not seem to be worsening. Takes several minutes for issues to resolve when he stops walking. States he just wants to give up walking sometimes due to walking.   ROS- no fecal incontinence. Urinary incontinence after prostate surgery. Denies leg weakness at rest.   Past Medical History- DM II, prostate cancer s/p seed implantation followed by urology, CLL followed by oncology, history lapband, HTN, HLD, depression, hypothyroidism  Medications- reviewed and updated Current Outpatient Prescriptions  Medication Sig Dispense Refill  . amLODipine (NORVASC) 10 MG tablet Take 10 mg by mouth every morning.    . insulin aspart (NOVOLOG) 100 UNIT/ML injection Inject 40 Units into the skin 3 (three) times daily with meals. When sugar is elevated    . insulin glargine (LANTUS) 100 UNIT/ML injection Inject 50 Units into the skin at bedtime.     . lansoprazole (PREVACID) 30 MG capsule Take 30 mg by mouth daily at 12 noon.    Marland Kitchen levothyroxine (SYNTHROID, LEVOTHROID) 50 MCG tablet Take 50 mcg by mouth daily before breakfast.     . losartan-hydrochlorothiazide (HYZAAR) 100-12.5 MG per tablet Take 1 tablet by mouth daily. 90 tablet 3  . mirtazapine (REMERON) 15 MG tablet Take 15 mg by mouth at bedtime.    . ONE TOUCH ULTRA TEST test strip Test 4 times daily 100 each 11  . potassium chloride SA (K-DUR,KLOR-CON) 20 MEQ tablet Take 20 mEq by mouth daily.    . sucralfate (CARAFATE) 1 G  tablet Take 1 tablet (1 g total) by mouth 3 (three) times daily with meals. 90 tablet 5  . traMADol (ULTRAM) 50 MG tablet TAKE ONE TABLET BY MOUTH EVERY 6 HOURS AS NEEDED (Patient not taking: Reported on 04/17/2015) 60 tablet 5   Objective: BP 110/78 mmHg  Pulse 96  Temp(Src) 98.3 F (36.8 C)  Wt 250 lb (113.399 kg) Gen: NAD, resting comfortably CV: RRR no murmurs rubs or gallops Lungs: CTAB no crackles, wheeze, rhonchi Abdomen: soft/nontender/nondistended/normal bowel sounds. No rebound or guarding. Obese.  Ext: no edema, 2+ PT and DP pulses Patient tends to be sore throughout his legs to palpation Skin: warm, dry Neuro: 5/5 muscle strength in lower extremities with rest   Assessment/Plan:  Leg weakness Unclear cause. Check ABIs from vascular standpoint but good pulses. Also check with Dr. Nelva Bush as this could certainly be neurogenic with history of lumbar disc issues. No statin as potential cause. History sounds like diabetic neuropathy given baseline numbness tingling but should not cause weakness.   Return precautions advised.   Also see letter. I reaffirmed disability through the [place] and stated unemployable on groudns of DM, prostate cancer, CLL and needs eval for leg weakness.

## 2015-04-18 ENCOUNTER — Ambulatory Visit: Payer: PRIVATE HEALTH INSURANCE | Admitting: Family Medicine

## 2015-04-18 ENCOUNTER — Encounter: Payer: Self-pay | Admitting: Family Medicine

## 2015-04-26 ENCOUNTER — Ambulatory Visit (HOSPITAL_COMMUNITY): Payer: Medicare Other | Attending: Cardiology

## 2015-04-26 DIAGNOSIS — E119 Type 2 diabetes mellitus without complications: Secondary | ICD-10-CM | POA: Diagnosis not present

## 2015-04-26 DIAGNOSIS — I10 Essential (primary) hypertension: Secondary | ICD-10-CM | POA: Diagnosis not present

## 2015-04-26 DIAGNOSIS — Z794 Long term (current) use of insulin: Secondary | ICD-10-CM | POA: Insufficient documentation

## 2015-04-26 DIAGNOSIS — R29898 Other symptoms and signs involving the musculoskeletal system: Secondary | ICD-10-CM

## 2015-04-26 DIAGNOSIS — E785 Hyperlipidemia, unspecified: Secondary | ICD-10-CM | POA: Insufficient documentation

## 2015-04-26 DIAGNOSIS — I739 Peripheral vascular disease, unspecified: Secondary | ICD-10-CM | POA: Diagnosis not present

## 2015-05-03 ENCOUNTER — Encounter (HOSPITAL_COMMUNITY): Payer: Self-pay

## 2015-05-03 NOTE — Progress Notes (Signed)
Unclear why this encounter is opened. We had ordered ABIs and they were completed at cardiology but no visit here was completed.

## 2015-05-08 LAB — LIPID PANEL
Cholesterol: 170 mg/dL (ref 0–200)
HDL: 47 mg/dL (ref 35–70)
LDL Cholesterol: 95 mg/dL
Triglycerides: 495 mg/dL — AB (ref 40–160)

## 2015-05-08 LAB — BASIC METABOLIC PANEL
BUN: 17 mg/dL (ref 4–21)
Creatinine: 1.2 mg/dL (ref 0.6–1.3)
Glucose: 188 mg/dL
Potassium: 3.6 mmol/L (ref 3.4–5.3)
Sodium: 142 mmol/L (ref 137–147)

## 2015-05-08 LAB — CBC AND DIFFERENTIAL
HCT: 46 % (ref 41–53)
Hemoglobin: 15.6 g/dL (ref 13.5–17.5)
Platelets: 269 10*3/uL (ref 150–399)
WBC: 10.4 10^3/mL

## 2015-05-08 LAB — TSH: TSH: 1.33 u[IU]/mL (ref 0.41–5.90)

## 2015-05-08 LAB — PSA: PSA: 0.01

## 2015-05-08 LAB — HEMOGLOBIN A1C: Hgb A1c MFr Bld: 6.6 % — AB (ref 4.0–6.0)

## 2015-05-08 LAB — HEPATIC FUNCTION PANEL: Bilirubin, Direct: 0.2 mg/dL (ref 0.01–0.4)

## 2015-05-11 ENCOUNTER — Encounter: Payer: Self-pay | Admitting: Family Medicine

## 2015-05-12 ENCOUNTER — Telehealth: Payer: Self-pay

## 2015-05-12 DIAGNOSIS — R29898 Other symptoms and signs involving the musculoskeletal system: Secondary | ICD-10-CM

## 2015-05-12 NOTE — Telephone Encounter (Signed)
Referral entered  

## 2015-05-12 NOTE — Telephone Encounter (Signed)
May refer to [place] neurology under leg weakness

## 2015-05-24 DIAGNOSIS — M5136 Other intervertebral disc degeneration, lumbar region: Secondary | ICD-10-CM | POA: Diagnosis not present

## 2015-05-24 DIAGNOSIS — M5032 Other cervical disc degeneration, mid-cervical region: Secondary | ICD-10-CM | POA: Diagnosis not present

## 2015-06-02 ENCOUNTER — Encounter: Payer: Self-pay | Admitting: Family Medicine

## 2015-06-06 ENCOUNTER — Encounter: Payer: Self-pay | Admitting: Family Medicine

## 2015-06-13 ENCOUNTER — Encounter (INDEPENDENT_AMBULATORY_CARE_PROVIDER_SITE_OTHER): Payer: Medicare Other | Admitting: Ophthalmology

## 2015-06-13 DIAGNOSIS — H35033 Hypertensive retinopathy, bilateral: Secondary | ICD-10-CM | POA: Diagnosis not present

## 2015-06-13 DIAGNOSIS — H3532 Exudative age-related macular degeneration: Secondary | ICD-10-CM | POA: Diagnosis not present

## 2015-06-13 DIAGNOSIS — H43813 Vitreous degeneration, bilateral: Secondary | ICD-10-CM

## 2015-06-13 DIAGNOSIS — E11321 Type 2 diabetes mellitus with mild nonproliferative diabetic retinopathy with macular edema: Secondary | ICD-10-CM | POA: Diagnosis not present

## 2015-06-13 DIAGNOSIS — I10 Essential (primary) hypertension: Secondary | ICD-10-CM | POA: Diagnosis not present

## 2015-06-13 DIAGNOSIS — E11329 Type 2 diabetes mellitus with mild nonproliferative diabetic retinopathy without macular edema: Secondary | ICD-10-CM | POA: Diagnosis not present

## 2015-06-13 DIAGNOSIS — E11319 Type 2 diabetes mellitus with unspecified diabetic retinopathy without macular edema: Secondary | ICD-10-CM | POA: Diagnosis not present

## 2015-06-15 ENCOUNTER — Encounter: Payer: Self-pay | Admitting: Family Medicine

## 2015-06-15 MED ORDER — TRAMADOL HCL 50 MG PO TABS: 50.0000 mg | ORAL_TABLET | Freq: Four times a day (QID) | ORAL | Status: DC | PRN

## 2015-06-15 NOTE — Telephone Encounter (Signed)
Rx called in to pharmacy. 

## 2015-06-15 NOTE — Telephone Encounter (Signed)
Dr. Yong Channel, okay to refill Tramadol?

## 2015-06-23 ENCOUNTER — Encounter: Payer: Self-pay | Admitting: Neurology

## 2015-06-23 ENCOUNTER — Ambulatory Visit (INDEPENDENT_AMBULATORY_CARE_PROVIDER_SITE_OTHER): Payer: Medicare Other | Admitting: Neurology

## 2015-06-23 VITALS — BP 138/84 | HR 79 | Ht 72.0 in | Wt 260.4 lb

## 2015-06-23 DIAGNOSIS — M4802 Spinal stenosis, cervical region: Secondary | ICD-10-CM

## 2015-06-23 DIAGNOSIS — M545 Low back pain, unspecified: Secondary | ICD-10-CM

## 2015-06-23 DIAGNOSIS — M48062 Spinal stenosis, lumbar region with neurogenic claudication: Secondary | ICD-10-CM

## 2015-06-23 DIAGNOSIS — M4806 Spinal stenosis, lumbar region: Secondary | ICD-10-CM

## 2015-06-23 DIAGNOSIS — R292 Abnormal reflex: Secondary | ICD-10-CM

## 2015-06-23 DIAGNOSIS — R202 Paresthesia of skin: Secondary | ICD-10-CM

## 2015-06-23 DIAGNOSIS — R29898 Other symptoms and signs involving the musculoskeletal system: Secondary | ICD-10-CM

## 2015-06-23 NOTE — Progress Notes (Signed)
[facility] Clinic Note - Initial Visit   Date: 06/23/2015  [name] MRN: [id number] DOB: 1941/05/24   Dear Dr. Yong Channel:  Thank you for your kind referral of [name] for consultation of bilateral leg weakness. Although his history is well known to you, please allow [place] to reiterate it for the purpose of our medical record. The patient was accompanied to the clinic by self.   History of Present Illness: [name] is a 74 y.o. right-handed Caucasian male with hypertension, insulin-dependent diabetes mellitus, prostate cancer s/p resection, lymphocytic lymphoma (followed by Dr. Alen Blew) hypothyroidism, and GERD presenting for evaluation of bilateral leg weakness.  Patient is a very poor historian.  He does not recall exactly how long symptoms having been going on.  He does not report problems with his arms, but on exam there is weakness. Despite asking very direct questions, he answers "I don't know" for nearly every question I ask.   Based on epic notes, he started complaining about difficulty with walking since early 2016, described as weakness and sensation of buckling. He used to be able to walk longer without getting very tired, but has noticed that it is much more difficult to do this. He has a harder time getting up out of low chairs as well as climbing stairs.  He had not had any falls and continues to walk independently.  He has pain involving his low back and thighs, which is slightly improved with rest. Walking always exacerbates his pain and weakness. He has tried physical therapy several times without improvement. 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   He sees Dr. Nelva Bush for steroid injections to his neck and back.   Out-side paper records, electronic medical record, and images have been reviewed where  available and summarized as:  MRI lumbar spine wo contrast 05/12/2013: Potentially symptomatic central and leftward protrusion at L2-L3.  Some disc space narrowing associated with a moderate sized Schmorl's node. Potentially symptomatic L3 nerve root encroachment at this level. Small synovial cyst L3-4, right. Moderate disc space narrowing L5-S1 without impingement.  MRI cervical spine wo contrast 05/12/2013:  Multilevel spondylosis as described. Potentially symptomatic neural encroachment at C4-5 and/or C5-6.  PET scan 03/30/2014:  1. No specific features identified to suggest metastatic disease. 2. Status post ORIF of right femur.   Lab Results  Component Value Date   TSH 1.33 05/08/2015   Lab Results  Component Value Date   HGBA1C 6.6* 05/08/2015      Past Medical History  Diagnosis Date  . Diabetes mellitus   . Diverticulosis of colon   . GERD (gastroesophageal reflux disease)   . Hyperlipidemia   . Hypertension   . OSA (obstructive sleep apnea)   . Colon polyps   . Elevated PSA   . Diabetic retinopathy   . Hypothyroidism   . IBS (irritable bowel syndrome)   . Macular degeneration     followed by ophthalmology  . Arthritis   . PONV (postoperative nausea and vomiting)   . Depression     PTSD  . DDD (degenerative disc disease), lumbar   . DDD (degenerative disc disease), lumbar     last lumbar steroid injection 11/24/13  . Prostate cancer   . PTSD (post-traumatic stress disorder)     [facility]  . Leukemia 01/2014  .  ERECTILE DYSFUNCTION 12/30/2007    No rx.       Past Surgical History  Procedure Laterality Date  . Orif tibia fracture Right   . Cholecystectomy    . Hemorrhoid surgery    . Laparoscopic gastric banding  03/05/11    weight loss  . Colonoscopy  03/25/12, 09/28/08  . Esophagogastroduodenoscopy endoscopy  09/28/08  . Penile prosthesis implant    . Laparoscopic gastric banding with hiatal hernia repair  03/05/2011  . Foot surgery Bilateral   .  Shoulder surgery Right 2011  . Vasectomy    . Robot assisted laparoscopic radical prostatectomy N/A 01/24/2014    Procedure: ROBOTIC ASSISTED LAPAROSCOPIC RADICAL PROSTATECTOMY LEVEL 3;  Surgeon: Dutch Gray, MD;  Location: [facility];  Service: Urology;  Laterality: N/A;  . Lymphadenectomy Bilateral 01/24/2014    Procedure: LYMPHADENECTOMY;  Surgeon: Dutch Gray, MD;  Location: [facility];  Service: Urology;  Laterality: Bilateral;  . Tonsillectomy      age 31  . Prostate surgery  01/2014  . Esophagogastroduodenoscopy N/A 10/12/2014    Procedure: ESOPHAGOGASTRODUODENOSCOPY (EGD);  Surgeon: Gatha Mayer, MD;  Location: [facility];  Service: Endoscopy;  Laterality: N/A;     Medications:  Outpatient Encounter Prescriptions as of 06/23/2015  Medication Sig Note  . amLODipine (NORVASC) 10 MG tablet Take 10 mg by mouth every morning.   . insulin aspart (NOVOLOG) 100 UNIT/ML injection Inject 40 Units into the skin 3 (three) times daily with meals. When sugar is elevated 01/24/2014: Took 15 units  . insulin glargine (LANTUS) 100 UNIT/ML injection Inject 50 Units into the skin at bedtime.    . lansoprazole (PREVACID) 30 MG capsule Take 30 mg by mouth daily at 12 noon.   Marland Kitchen levothyroxine (SYNTHROID, LEVOTHROID) 50 MCG tablet Take 50 mcg by mouth daily before breakfast.    . losartan-hydrochlorothiazide (HYZAAR) 100-12.5 MG per tablet Take 1 tablet by mouth daily.   . mirtazapine (REMERON) 15 MG tablet Take 15 mg by mouth at bedtime.   . ONE TOUCH ULTRA TEST test strip Test 4 times daily   . potassium chloride SA (K-DUR,KLOR-CON) 20 MEQ tablet Take 20 mEq by mouth daily.   . [DISCONTINUED] sucralfate (CARAFATE) 1 G tablet Take 1 tablet (1 g total) by mouth 3 (three) times daily with meals.   . [DISCONTINUED] traMADol (ULTRAM) 50 MG tablet Take 1 tablet (50 mg total) by mouth every 6 (six) hours as needed.    No facility-administered encounter medications on file as of 06/23/2015.     Allergies:  Allergies    Allergen Reactions  . Morphine Sulfate Itching and Rash    Family History: Family History  Problem Relation Age of Onset  . Hypertension Mother   . Stomach cancer Mother 8  . Alcohol abuse Brother   . Hypertension Paternal Uncle   . Heart attack Paternal Uncle   . Colon cancer Neg Hx   . Colon polyps Neg Hx   . Rectal cancer Neg Hx     Social History: History  Substance Use Topics  . Smoking status: Former Smoker -- 1.00 packs/day for 20 years    Types: Cigarettes    Quit date: 02/25/1981  . Smokeless tobacco: Never Used  . Alcohol Use: No   History   Social History Narrative   Married. 3 children from previous marriage, 2 step children. 15 grandkids.       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   EYES: No visual changes or eye pain ENT: No hearing changes.  No history of nose bleeds.   RESPIRATORY: No cough, wheezing and shortness of breath.   CARDIOVASCULAR: Negative for chest pain, and palpitations.   GI: Negative for abdominal discomfort, blood in stools or black stools.  No recent change in bowel habits.   GU:  No history of incontinence.   MUSCLOSKELETAL: +history of joint pain or swelling.  No myalgias.   SKIN: Negative for lesions, rash, and itching.   HEMATOLOGY/ONCOLOGY: Negative for prolonged bleeding, bruising easily, and swollen nodes.  +history of cancer.   ENDOCRINE: Negative for cold or heat intolerance, polydipsia or goiter.   PSYCH:  No depression or anxiety symptoms.   NEURO: As Above.   Vital Signs:  BP 138/84 mmHg  Pulse 79  Ht 6' (1.829 m)  Wt 260 lb 6 oz (118.105 kg)  BMI 35.31 kg/m2  SpO2 96%   General Medical Exam:   General:  Well appearing, comfortable.   Eyes/ENT: see cranial nerve examination.   Neck: No masses appreciated.  Full range of motion without tenderness.   No carotid bruits. Respiratory:  Clear to auscultation, good air entry bilaterally.   Cardiac:  Regular rate and rhythm, no murmur.   Extremities:  No deformities, edema, or skin discoloration.  Skin:  No rashes or lesions.  Neurological Exam: MENTAL STATUS including orientation to time, place, person, recent and remote memory, attention span and concentration, language, and fund of knowledge is fair. Speech is not dysarthric.  CRANIAL NERVES: II:  No visual field defects.  Unremarkable fundi.   III-IV-VI: Pupils equal round and reactive to light.  Normal conjugate, extra-ocular eye movements in all directions of gaze.  No nystagmus.  No ptosis.   V:  Normal facial sensation.     VII:  Normal facial symmetry and movements.  No pathologic facial reflexes.  VIII:  Normal hearing and vestibular function.   IX-X:  Normal palatal movement.   XI:  Normal shoulder shrug and head rotation.   XII:  Normal tongue strength and range of motion, no deviation or fasciculation.  MOTOR:  No atrophy, fasciculations or abnormal movements.  No pronator drift.  Tone is normal.    Right Upper Extremity:    Left Upper Extremity:    Deltoid  4+/5   Deltoid  4+/5   Biceps  5/5   Biceps  5/5   Triceps  5/5   Triceps  5/5   Wrist extensors  5/5   Wrist extensors  5/5   Wrist flexors  5/5   Wrist flexors  5/5   Finger extensors  5/5   Finger extensors  5/5   Finger flexors  5/5   Finger flexors  5/5   Dorsal interossei  5/5   Dorsal interossei  5/5   Abductor pollicis  5/5   Abductor pollicis  5/5   Tone (Ashworth scale)  0  Tone (Ashworth scale)  0   Right Lower Extremity:    Left Lower Extremity:    Hip flexors  4+/5   Hip flexors  4+/5   Hip extensors  5-/5   Hip extensors  5-/5   Knee flexors  5/5   Knee flexors  5/5   Knee extensors  4+/5   Knee extensors  4+/5   Dorsiflexors  4+/5   Dorsiflexors  4+/5  Plantarflexors  4+/5   Plantarflexors  4+/5   Toe extensors  4+/5   Toe extensors  4+/5   Toe  flexors  4+/5   Toe flexors  4+/5   Tone (Ashworth scale)  0  Tone (Ashworth scale)  0   MSRs:  Right                                                                 Left brachioradialis 2+  brachioradialis 2+  biceps 2+  biceps 2+  triceps 2+  triceps 2+  patellar 2+  patellar 2+  ankle jerk 1+  ankle jerk 1+  Hoffman no  Hoffman no  plantar response down  plantar response down   SENSORY:  Vibration reduced at the toes bilaterally, otherwise normal and symmetric perception of light touch, pinprick, and proprioception.   COORDINATION/GAIT: Normal finger-to- nose-finger.  Intact rapid alternating movements bilaterally.  Unable to rise from a chair without using arms.  Gait is antalgic appearing and slow.  He can stand on toe and heel.  Unsteady with tandem gait.   IMPRESSION: Mr. Labonte is a 74 year-old gentleman presenting for evaluation of bilateral leg weakness and gait difficulty.  His exam shows both upper and lower extremity weakness. Symptoms are most consistent with neurogenic claudication affecting the legs. He also has radicular pain involving the L2-3 dermatome which is consistent with his imaging.   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    Patient declined physical therapy referral and is not interested in surgical opinion. Instead, he would like to reassess his cervical and lumbar spine imaging and decide from there.    PLAN/RECOMMENDATIONS:  1.  MRI cervical and lumbar spine 2.  Encouraged him to use a gait assist device to prevent falls 3.  Results will be communicated via telephone   The duration of this appointment visit was 40 minutes of face-to-face time with the patient.  Greater than 50% of this time was spent in counseling, explanation of diagnosis, planning of further management, and coordination of care.   Thank you for allowing me to participate in patient's care.  If I  can answer any additional questions, I would be pleased to do so.    Sincerely,    Donika K. Posey Pronto, DO

## 2015-06-23 NOTE — Patient Instructions (Addendum)
1.  MRI cervical and lumbar spine wo contrast 2.  We will call you with the results

## 2015-06-26 ENCOUNTER — Encounter (INDEPENDENT_AMBULATORY_CARE_PROVIDER_SITE_OTHER): Payer: Medicare Other | Admitting: Ophthalmology

## 2015-06-26 DIAGNOSIS — E11329 Type 2 diabetes mellitus with mild nonproliferative diabetic retinopathy without macular edema: Secondary | ICD-10-CM

## 2015-06-26 DIAGNOSIS — H43813 Vitreous degeneration, bilateral: Secondary | ICD-10-CM | POA: Diagnosis not present

## 2015-06-26 DIAGNOSIS — H35033 Hypertensive retinopathy, bilateral: Secondary | ICD-10-CM

## 2015-06-26 DIAGNOSIS — H3532 Exudative age-related macular degeneration: Secondary | ICD-10-CM | POA: Diagnosis not present

## 2015-06-26 DIAGNOSIS — H2513 Age-related nuclear cataract, bilateral: Secondary | ICD-10-CM

## 2015-06-26 DIAGNOSIS — E11319 Type 2 diabetes mellitus with unspecified diabetic retinopathy without macular edema: Secondary | ICD-10-CM

## 2015-06-26 DIAGNOSIS — I10 Essential (primary) hypertension: Secondary | ICD-10-CM

## 2015-07-12 DIAGNOSIS — M5136 Other intervertebral disc degeneration, lumbar region: Secondary | ICD-10-CM | POA: Diagnosis not present

## 2015-07-12 DIAGNOSIS — M5082 Other cervical disc disorders, mid-cervical region: Secondary | ICD-10-CM | POA: Diagnosis not present

## 2015-08-10 ENCOUNTER — Ambulatory Visit: Payer: PRIVATE HEALTH INSURANCE | Admitting: Family Medicine

## 2015-08-11 ENCOUNTER — Ambulatory Visit (INDEPENDENT_AMBULATORY_CARE_PROVIDER_SITE_OTHER): Payer: Medicare Other | Admitting: Family Medicine

## 2015-08-11 ENCOUNTER — Encounter: Payer: Self-pay | Admitting: Family Medicine

## 2015-08-11 VITALS — BP 132/82 | HR 84 | Temp 98.5°F | Wt 256.0 lb

## 2015-08-11 DIAGNOSIS — E119 Type 2 diabetes mellitus without complications: Secondary | ICD-10-CM | POA: Diagnosis not present

## 2015-08-11 DIAGNOSIS — Z23 Encounter for immunization: Secondary | ICD-10-CM

## 2015-08-11 DIAGNOSIS — G250 Essential tremor: Secondary | ICD-10-CM

## 2015-08-11 DIAGNOSIS — R251 Tremor, unspecified: Secondary | ICD-10-CM | POA: Insufficient documentation

## 2015-08-11 DIAGNOSIS — I10 Essential (primary) hypertension: Secondary | ICD-10-CM | POA: Diagnosis not present

## 2015-08-11 DIAGNOSIS — R29898 Other symptoms and signs involving the musculoskeletal system: Secondary | ICD-10-CM

## 2015-08-11 DIAGNOSIS — E785 Hyperlipidemia, unspecified: Secondary | ICD-10-CM

## 2015-08-11 HISTORY — DX: Tremor, unspecified: R25.1

## 2015-08-11 LAB — HEMOGLOBIN A1C: Hgb A1c MFr Bld: 6.6 % — ABNORMAL HIGH (ref 4.6–6.5)

## 2015-08-11 NOTE — Assessment & Plan Note (Signed)
S:High triglycerides. Refuses statin.  Lab Results  Component Value Date   CHOL 170 05/08/2015   HDL 47 05/08/2015   LDLCALC 95 05/08/2015   LDLDIRECT 90.6 11/16/2013   TRIG 495* 05/08/2015   CHOLHDL 4 11/16/2013  A/P: counseled on increased cardiac risk, continues to decline statin

## 2015-08-11 NOTE — Assessment & Plan Note (Signed)
S: controlled. compliant with meds  BP Readings from Last 3 Encounters:  08/11/15 132/82  06/23/15 138/84  04/17/15 110/78  A/P:Continue current meds:  Amlodipine 10mg , losartan 100mg , HCTZ 25mg 

## 2015-08-11 NOTE — Progress Notes (Signed)
I called [facility] and they are calling patient to set this up.

## 2015-08-11 NOTE — Progress Notes (Signed)
[name], MD  Subjective:  [name] is a 74 y.o. year old very pleasant male patient who presents for/with See problem oriented charting ROS- no chest pain, shortness of breath, tremor in hands- no low blood sugar with this, occasional low blood sugars right around 70 if misses meals- discouraged missing meals  Past Medical History-  Patient Active Problem List   Diagnosis Date Noted  . Chronic lymphocytic leukemia 05/16/2014    Priority: High  . Prostate cancer 01/24/2014    Priority: High  . Diabetes mellitus type 2, controlled 09/24/2007    Priority: High  . Hypothyroidism     Priority: Medium  . 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 Depression 09/21/2009    Priority: Medium  . Diabetic retinopathy 09/24/2007    Priority: Medium  . Hyperlipidemia 09/24/2007    Priority: Medium  . OSA on CPAP 09/24/2007    Priority: Medium  . Essential hypertension 09/24/2007    Priority: Medium  . Essential tremor 08/11/2015    Priority: Low  . GERD (gastroesophageal reflux disease) 12/08/2014    Priority: Low  . Obesity 10/25/2008    Priority: Low  . History of colonic polyps 08/22/2008    Priority: Low    Medications- reviewed and updated Current Outpatient Prescriptions  Medication Sig Dispense Refill  . amLODipine (NORVASC) 10 MG tablet Take 10 mg by mouth every morning.    . hydrochlorothiazide (HYDRODIURIL) 25 MG tablet Take 25 mg by mouth daily.    . insulin aspart (NOVOLOG) 100 UNIT/ML injection Inject 40 Units into the skin 3 (three) times daily with meals. When sugar is elevated    . insulin glargine (LANTUS) 100 UNIT/ML injection Inject 50 Units into the skin at bedtime.     . lansoprazole (PREVACID) 30 MG capsule Take 30 mg by mouth daily at 12 noon.    Marland Kitchen levothyroxine (SYNTHROID, LEVOTHROID) 50 MCG tablet Take 50 mcg by mouth daily before breakfast.     . losartan (COZAAR) 100 MG tablet Take 100 mg by mouth daily.    . mirtazapine  (REMERON) 15 MG tablet Take 15 mg by mouth at bedtime.    . ONE TOUCH ULTRA TEST test strip Test 4 times daily 100 each 11  . potassium chloride SA (K-DUR,KLOR-CON) 20 MEQ tablet Take 20 mEq by mouth daily.    . sucralfate (CARAFATE) 1 G tablet Take 1 g by mouth 4 (four) times daily -  with meals and at bedtime.     No current facility-administered medications for this visit.    Objective: BP 132/82 mmHg  Pulse 84  Temp(Src) 98.5 F (36.9 C)  Wt 256 lb (116.121 kg) Gen: NAD, resting comfortably CV: RRR no murmurs rubs or gallops Lungs: CTAB no crackles, wheeze, rhonchi Abdomen: soft/nontender/nondistended/normal bowel sounds. No rebound or guarding.  Ext: trace edema Skin: warm, dry Neuro: grossly normal, moves all extremities, good grip strength, mild intention tremor noted, no tremor at rest  Assessment/Plan:  Diabetes mellitus type 2, controlled S: controlled previously. After he eats, sugars do tend to get above 200. No lows unless he doesn't eat- happens about once a month- advised not to miss meals.  Lab Results  Component Value Date   HGBA1C 6.6* 05/08/2015  A/P:Continue current meds:  Lantus 50 units, novolog 40 TID.    Essential hypertension S: controlled. compliant with meds  BP Readings from Last 3 Encounters:  08/11/15 132/82  06/23/15 138/84  04/17/15 110/78  A/P:Continue current meds:  Amlodipine 10mg , losartan 100mg , HCTZ 25mg    Hyperlipidemia S:High triglycerides. Refuses statin.  Lab Results  Component Value Date   CHOL 170 05/08/2015   HDL 47 05/08/2015   LDLCALC 95 05/08/2015   LDLDIRECT 90.6 11/16/2013   TRIG 495* 05/08/2015   CHOLHDL 4 11/16/2013  A/P: counseled on increased cardiac risk, continues to decline statin   Essential tremor S: mild tremor when patient reaches out his hand. States throughout his family there is history of shaking hands or heads as people get older. No parkinsons in family A/P: suspect this is an essential tremor.  Mild and does not significantly bother patient but we discussed if worsened could trial a beta blocker.    Bilateral leg weakness.  S: Sees Dr. Nelva Bush. Last MRI 2014- possible symptomatic encroachment c4-c5 and c5-c6 cervical. Lumbar- potentially symptomatic central and leftward protrusion l2-l3. Potential L3 nerve root encroachment. Saw Dr. Posey Pronto in July adn MRI cervical and lumbar were ordered but not yet obtained. Patient continues to have issues with leg weakness. No falls.  A/P: will reach out to Dr. Posey Pronto, not sure why MRI has not been scheduled as it was obviously ordered. Message through epic sent  4 months follow up  Orders Placed This Encounter  Procedures  . Flu Vaccine QUAD 36+ mos IM  . Hemoglobin A1c  . Hemoglobin A1c    [place]    Meds ordered this encounter  Medications  . sucralfate (CARAFATE) 1 G tablet    Sig: Take 1 g by mouth 4 (four) times daily -  with meals and at bedtime.  . hydrochlorothiazide (HYDRODIURIL) 25 MG tablet    Sig: Take 25 mg by mouth daily.  Marland Kitchen losartan (COZAAR) 100 MG tablet    Sig: Take 100 mg by mouth daily.

## 2015-08-11 NOTE — Assessment & Plan Note (Signed)
S: controlled previously. After he eats, sugars do tend to get above 200. No lows unless he doesn't eat- happens about once a month- advised not to miss meals.  Lab Results  Component Value Date   HGBA1C 6.6* 05/08/2015  A/P:Continue current meds:  Lantus 50 units, novolog 40 TID.

## 2015-08-11 NOTE — Patient Instructions (Addendum)
  Medication Instructions:  No changes You declined a statin for your triglycerides  Other Instructions:  Advise regular exercise- ok to do it until you hurt in your backand legs then stop  Labwork: a1c before you go  Testing/Procedures/Immunizations: Received flu shot today.  Follow-Up (all visit scheduling, rescheduling, cancellations including labs should be scheduled at front desk): 4 months.   Sooner if you need [place] or if you have new or worsening symptoms

## 2015-08-11 NOTE — Assessment & Plan Note (Signed)
S: mild tremor when patient reaches out his hand. States throughout his family there is history of shaking hands or heads as people get older. No parkinsons in family A/P: suspect this is an essential tremor. Mild and does not significantly bother patient but we discussed if worsened could trial a beta blocker.

## 2015-08-14 NOTE — Progress Notes (Signed)
Yes.  He is set up for 08-24-15.

## 2015-08-15 ENCOUNTER — Encounter (INDEPENDENT_AMBULATORY_CARE_PROVIDER_SITE_OTHER): Payer: Medicare Other | Admitting: Ophthalmology

## 2015-08-15 DIAGNOSIS — H35033 Hypertensive retinopathy, bilateral: Secondary | ICD-10-CM

## 2015-08-15 DIAGNOSIS — H43813 Vitreous degeneration, bilateral: Secondary | ICD-10-CM | POA: Diagnosis not present

## 2015-08-15 DIAGNOSIS — E11319 Type 2 diabetes mellitus with unspecified diabetic retinopathy without macular edema: Secondary | ICD-10-CM | POA: Diagnosis not present

## 2015-08-15 DIAGNOSIS — E11329 Type 2 diabetes mellitus with mild nonproliferative diabetic retinopathy without macular edema: Secondary | ICD-10-CM

## 2015-08-15 DIAGNOSIS — H3532 Exudative age-related macular degeneration: Secondary | ICD-10-CM

## 2015-08-15 DIAGNOSIS — I10 Essential (primary) hypertension: Secondary | ICD-10-CM

## 2015-08-24 ENCOUNTER — Ambulatory Visit
Admission: RE | Admit: 2015-08-24 | Discharge: 2015-08-24 | Disposition: A | Discharge status: Home / Self Care | Payer: PRIVATE HEALTH INSURANCE | Source: Ambulatory Visit | Attending: Neurology | Admitting: Neurology

## 2015-08-24 ENCOUNTER — Other Ambulatory Visit: Payer: PRIVATE HEALTH INSURANCE

## 2015-08-24 ENCOUNTER — Ambulatory Visit
Admission: RE | Admit: 2015-08-24 | Discharge: 2015-08-24 | Disposition: A | Discharge status: Home / Self Care | Payer: Medicare Other | Source: Ambulatory Visit | Attending: Neurology | Admitting: Neurology

## 2015-08-24 DIAGNOSIS — M545 Low back pain, unspecified: Secondary | ICD-10-CM

## 2015-08-24 DIAGNOSIS — M4802 Spinal stenosis, cervical region: Secondary | ICD-10-CM

## 2015-08-24 DIAGNOSIS — R202 Paresthesia of skin: Secondary | ICD-10-CM

## 2015-08-24 DIAGNOSIS — R29898 Other symptoms and signs involving the musculoskeletal system: Secondary | ICD-10-CM

## 2015-08-24 DIAGNOSIS — M48062 Spinal stenosis, lumbar region with neurogenic claudication: Secondary | ICD-10-CM

## 2015-08-24 DIAGNOSIS — R292 Abnormal reflex: Secondary | ICD-10-CM

## 2015-08-30 DIAGNOSIS — Z8546 Personal history of malignant neoplasm of prostate: Secondary | ICD-10-CM | POA: Diagnosis not present

## 2015-09-03 ENCOUNTER — Ambulatory Visit
Admission: RE | Admit: 2015-09-03 | Discharge: 2015-09-03 | Disposition: A | Discharge status: Home / Self Care | Payer: Medicare Other | Source: Ambulatory Visit | Attending: Neurology | Admitting: Neurology

## 2015-09-03 DIAGNOSIS — M4802 Spinal stenosis, cervical region: Secondary | ICD-10-CM | POA: Diagnosis not present

## 2015-09-03 DIAGNOSIS — M4806 Spinal stenosis, lumbar region: Secondary | ICD-10-CM | POA: Diagnosis not present

## 2015-09-04 ENCOUNTER — Telehealth: Payer: Self-pay | Admitting: Neurology

## 2015-09-04 ENCOUNTER — Telehealth: Payer: Self-pay | Admitting: *Deleted

## 2015-09-04 NOTE — Telephone Encounter (Signed)
Called and discussed MRI cervical and lumbar spine imaging.  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  He continues to have right shoulder stabbing pain which is intermittent.  Pain very well is most like radicular from his foraminal stenosis.  He is already getting steroid injections by Dr. Nelva Bush.  He is not interested in surgical opinion.  I offered occupational therapy and neck PT, but he would like to think about it and will call if he decides to start therapy.  Lumbar imaging is stable as compared to 2014 and shows mild arthritis.   Adelena Desantiago K. Posey Pronto, DO

## 2015-09-04 NOTE — Telephone Encounter (Signed)
No note

## 2015-09-06 DIAGNOSIS — N529 Male erectile dysfunction, unspecified: Secondary | ICD-10-CM | POA: Diagnosis not present

## 2015-09-06 DIAGNOSIS — N393 Stress incontinence (female) (male): Secondary | ICD-10-CM | POA: Diagnosis not present

## 2015-09-06 DIAGNOSIS — Z8546 Personal history of malignant neoplasm of prostate: Secondary | ICD-10-CM | POA: Diagnosis not present

## 2015-09-27 ENCOUNTER — Encounter: Payer: Self-pay | Admitting: Family Medicine

## 2015-10-02 ENCOUNTER — Ambulatory Visit (INDEPENDENT_AMBULATORY_CARE_PROVIDER_SITE_OTHER): Payer: Medicare Other | Admitting: Family Medicine

## 2015-10-02 ENCOUNTER — Encounter (INDEPENDENT_AMBULATORY_CARE_PROVIDER_SITE_OTHER): Payer: Medicare Other | Admitting: Ophthalmology

## 2015-10-02 VITALS — BP 140/70 | HR 93 | Temp 99.0°F | Wt 262.0 lb

## 2015-10-02 DIAGNOSIS — I10 Essential (primary) hypertension: Secondary | ICD-10-CM

## 2015-10-02 DIAGNOSIS — H353231 Exudative age-related macular degeneration, bilateral, with active choroidal neovascularization: Secondary | ICD-10-CM

## 2015-10-02 DIAGNOSIS — R0600 Dyspnea, unspecified: Secondary | ICD-10-CM

## 2015-10-02 DIAGNOSIS — E113213 Type 2 diabetes mellitus with mild nonproliferative diabetic retinopathy with macular edema, bilateral: Secondary | ICD-10-CM | POA: Diagnosis not present

## 2015-10-02 DIAGNOSIS — R0609 Other forms of dyspnea: Secondary | ICD-10-CM

## 2015-10-02 DIAGNOSIS — R5383 Other fatigue: Secondary | ICD-10-CM | POA: Diagnosis not present

## 2015-10-02 DIAGNOSIS — E11311 Type 2 diabetes mellitus with unspecified diabetic retinopathy with macular edema: Secondary | ICD-10-CM

## 2015-10-02 DIAGNOSIS — H35033 Hypertensive retinopathy, bilateral: Secondary | ICD-10-CM

## 2015-10-02 DIAGNOSIS — R531 Weakness: Secondary | ICD-10-CM | POA: Diagnosis not present

## 2015-10-02 DIAGNOSIS — R0602 Shortness of breath: Secondary | ICD-10-CM

## 2015-10-02 DIAGNOSIS — H2513 Age-related nuclear cataract, bilateral: Secondary | ICD-10-CM

## 2015-10-02 NOTE — Progress Notes (Signed)
[name], MD  Subjective:  [name] is a 74 y.o. year old very pleasant male patient who presents for/with See problem oriented charting ROS- no chest pain at rest or with activity, no diaphoresis, no left arm or neck pain. No fever, chills, unintentional weight loss  Past Medical History-  Patient Active Problem List   Diagnosis Date Noted  . Chronic lymphocytic leukemia ([place]) 05/16/2014    Priority: High  . Prostate cancer ([place]) 01/24/2014    Priority: High  . Diabetes mellitus type 2, controlled ([place]) 09/24/2007    Priority: High  . Hypothyroidism     Priority: Medium  . 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 Depression 09/21/2009    Priority: Medium  . Diabetic retinopathy ([place]) 09/24/2007    Priority: Medium  . Hyperlipidemia 09/24/2007    Priority: Medium  . OSA on CPAP 09/24/2007    Priority: Medium  . Essential hypertension 09/24/2007    Priority: Medium  . Essential tremor 08/11/2015    Priority: Low  . GERD (gastroesophageal reflux disease) 12/08/2014    Priority: Low  . Obesity 10/25/2008    Priority: Low  . History of colonic polyps 08/22/2008    Priority: Low    Medications- reviewed and updated Current Outpatient Prescriptions  Medication Sig Dispense Refill  . amLODipine (NORVASC) 10 MG tablet Take 10 mg by mouth every morning.    . hydrochlorothiazide (HYDRODIURIL) 25 MG tablet Take 25 mg by mouth daily.    . insulin aspart (NOVOLOG) 100 UNIT/ML injection Inject 40 Units into the skin 3 (three) times daily with meals. When sugar is elevated    . insulin glargine (LANTUS) 100 UNIT/ML injection Inject 50 Units into the skin at bedtime.     . lansoprazole (PREVACID) 30 MG capsule Take 30 mg by mouth daily at 12 noon.    Marland Kitchen levothyroxine (SYNTHROID, LEVOTHROID) 50 MCG tablet Take 50 mcg by mouth daily before breakfast.     . losartan (COZAAR) 100 MG tablet Take 100 mg by mouth daily.    . mirtazapine (REMERON) 15 MG tablet  Take 15 mg by mouth at bedtime.    . ONE TOUCH ULTRA TEST test strip Test 4 times daily 100 each 11  . potassium chloride SA (K-DUR,KLOR-CON) 20 MEQ tablet Take 20 mEq by mouth daily.    . sucralfate (CARAFATE) 1 G tablet Take 1 g by mouth 4 (four) times daily -  with meals and at bedtime.     No current facility-administered medications for this visit.    Objective: BP 140/70 mmHg  Pulse 93  Temp(Src) 99 F (37.2 C)  Wt 262 lb (118.842 kg) Gen: NAD, resting comfortably No JVD CV: RRR no murmurs rubs or gallops Lungs: CTAB no crackles, wheeze, rhonchi Abdomen: soft/nontender/nondistended/normal bowel sounds. No rebound or guarding.  Ext: trace edema Skin: warm, dry Neuro: grossly normal, moves all extremities  Assessment/Plan: Exertional fatigue and shortness of breath S: Can't stand up prolonged period- legs and back get to hurting. Seems to be primary driver. Symptoms close to a year. Getting slightly worse. Denies any chest pain with activity. Working in the yard- has really gone down. Does get more short of breath than previous with these activities. He has checked his sugar during these episodes and never less than 100, a1c has been 6.6. He is compliant with his CPAP.  O: EKG: Rate 78, normal sinus rhythm, left axis deviation, normal intervals, no hypertrophy, no  st or t wave changes A/P: Exertional fatigue and shortness of breath in 74 year old with hypertension, hyperlipidemia, diabetes. My primary concern is ischemic heart disease. EKG reassuring but obviously does not rule this out.  We will check lexiscan myoview. In addition, get echocardiogram to rule out valvular issues though no murmur. That being said- this may really boil down to being pain related given his extensive back pain that seems to slow him down. Will also get CXR- doubt mets from prostate cancer or malignancy in general but this is reasonable initial screen and was former smoker as well. Labs reasonably up to  date so did not repeat at this time- 5 months ago but states has been ahving symptoms at least a year  DDx Possibly CLL related? But doubt as this was an incidental finding only- no systemic treatment planned OSA related- compliant with CPAP though Hypothyroidism- TSH in this time has been normal Malignancy-  Up to date on colonoscopy in 2013, with repeat 2018 and no rectal bleeding. PSA has been undetectable.  Lab Results  Component Value Date   PSA 0.010 05/08/2015   PSA <0.1 10/24/2014   PSA <0.1 07/15/2014  At risk CAD- HTn, HLD, dm  Emergent/strict Return precautions advised.   Orders Placed This Encounter  Procedures  . DG Chest 2 View    Standing Status: Future     Number of Occurrences:      Standing Expiration Date: 12/02/2016    Order Specific Question:  Reason for Exam (SYMPTOM  OR DIAGNOSIS REQUIRED)    Answer:  shortness of breath    Order Specific Question:  Preferred imaging location?    Answer:  [facility]  . [facility]    Standing Status: Future     Number of Occurrences:      Standing Expiration Date: 10/01/2016    Order Specific Question:  Where should this test be performed    Answer:  [facility])    Order Specific Question:  Type of stress    Answer:  Lexiscan    Order Specific Question:  Patient weight in lbs    Answer:  262  . EKG 12-Lead  . Echocardiogram    Standing Status: Future     Number of Occurrences:      Standing Expiration Date: 01/01/2017    Scheduling Instructions:     Exertional fatigue    Order Specific Question:  Where should this test be performed    Answer:  [facility])    Order Specific Question:  Complete or Limited study?    Answer:  Complete    Order Specific Question:  With Image Enhancing Agent or without Image Enhancing Agent?    Answer:  With Image Enhancing Agent    Order Specific Question:  Reason for exam-Echo    Answer:  Other - See Comments  Section

## 2015-10-02 NOTE — Patient Instructions (Signed)
Due to your episodes of feeling weak and with more shortness of breath than previous, rule out heart cause with:  1. Stress test (not on treadmill) 2. Echocardiogram (ultrasound of heart)  I think these are the 2 most important things to evaluate you for at this time. We will evaluate further if needed.

## 2015-10-03 ENCOUNTER — Encounter: Payer: Self-pay | Admitting: Family Medicine

## 2015-10-03 ENCOUNTER — Ambulatory Visit (INDEPENDENT_AMBULATORY_CARE_PROVIDER_SITE_OTHER)
Admission: RE | Admit: 2015-10-03 | Discharge: 2015-10-03 | Disposition: A | Discharge status: Home / Self Care | Payer: Medicare Other | Source: Ambulatory Visit | Attending: Family Medicine | Admitting: Family Medicine

## 2015-10-03 DIAGNOSIS — R0602 Shortness of breath: Secondary | ICD-10-CM

## 2015-10-09 ENCOUNTER — Encounter: Payer: Self-pay | Admitting: Cardiovascular Disease

## 2015-10-10 ENCOUNTER — Telehealth (HOSPITAL_COMMUNITY): Payer: Self-pay | Admitting: *Deleted

## 2015-10-10 ENCOUNTER — Ambulatory Visit (HOSPITAL_BASED_OUTPATIENT_CLINIC_OR_DEPARTMENT_OTHER): Payer: Medicare Other | Admitting: Oncology

## 2015-10-10 ENCOUNTER — Telehealth (HOSPITAL_COMMUNITY): Payer: Self-pay | Admitting: Radiology

## 2015-10-10 ENCOUNTER — Other Ambulatory Visit (HOSPITAL_BASED_OUTPATIENT_CLINIC_OR_DEPARTMENT_OTHER): Payer: Medicare Other

## 2015-10-10 ENCOUNTER — Telehealth: Payer: Self-pay | Admitting: Oncology

## 2015-10-10 VITALS — BP 157/80 | HR 93 | Temp 97.7°F | Resp 18 | Ht 72.0 in | Wt 265.2 lb

## 2015-10-10 DIAGNOSIS — R5383 Other fatigue: Secondary | ICD-10-CM | POA: Diagnosis not present

## 2015-10-10 DIAGNOSIS — C61 Malignant neoplasm of prostate: Secondary | ICD-10-CM

## 2015-10-10 DIAGNOSIS — C9110 Chronic lymphocytic leukemia of B-cell type not having achieved remission: Secondary | ICD-10-CM

## 2015-10-10 DIAGNOSIS — Z8546 Personal history of malignant neoplasm of prostate: Secondary | ICD-10-CM

## 2015-10-10 LAB — COMPREHENSIVE METABOLIC PANEL (CC13)
ALT: 43 U/L (ref 0–55)
AST: 32 U/L (ref 5–34)
Albumin: 4 g/dL (ref 3.5–5.0)
Alkaline Phosphatase: 48 U/L (ref 40–150)
Anion Gap: 10 mEq/L (ref 3–11)
BUN: 15.2 mg/dL (ref 7.0–26.0)
CO2: 27 mEq/L (ref 22–29)
Calcium: 9.8 mg/dL (ref 8.4–10.4)
Chloride: 105 mEq/L (ref 98–109)
Creatinine: 1.1 mg/dL (ref 0.7–1.3)
EGFR: 69 mL/min/{1.73_m2} — ABNORMAL LOW (ref >90)
Glucose: 152 mg/dl — ABNORMAL HIGH (ref 70–140)
Potassium: 3.7 mEq/L (ref 3.5–5.1)
Sodium: 141 mEq/L (ref 136–145)
Total Bilirubin: 0.76 mg/dL (ref 0.20–1.20)
Total Protein: 6.7 g/dL (ref 6.4–8.3)

## 2015-10-10 LAB — CBC WITH DIFFERENTIAL/PLATELET
BASO%: 0.9 % (ref 0.0–2.0)
Basophils Absolute: 0.1 10*3/uL (ref 0.0–0.1)
EOS%: 3.4 % (ref 0.0–7.0)
Eosinophils Absolute: 0.4 10*3/uL (ref 0.0–0.5)
HCT: 46.8 % (ref 38.4–49.9)
HGB: 15.8 g/dL (ref 13.0–17.1)
LYMPH%: 42.8 % (ref 14.0–49.0)
MCH: 28.3 pg (ref 27.2–33.4)
MCHC: 33.8 g/dL (ref 32.0–36.0)
MCV: 83.6 fL (ref 79.3–98.0)
MONO#: 0.9 10*3/uL (ref 0.1–0.9)
MONO%: 8.7 % (ref 0.0–14.0)
NEUT#: 4.7 10*3/uL (ref 1.5–6.5)
NEUT%: 44.2 % (ref 39.0–75.0)
Platelets: 215 10*3/uL (ref 140–400)
RBC: 5.6 10*6/uL (ref 4.20–5.82)
RDW: 14.1 % (ref 11.0–14.6)
WBC: 10.6 10*3/uL — ABNORMAL HIGH (ref 4.0–10.3)
lymph#: 4.5 10*3/uL — ABNORMAL HIGH (ref 0.9–3.3)

## 2015-10-10 NOTE — Progress Notes (Signed)
Hematology and Oncology Follow Up Visit  [name] [id number] Mar 26, 1941 74 y.o. 10/10/2015 9:44 AM [name], MDHunter, [name], MD   Principle Diagnosis: 74 year old gentleman with prostate cancer diagnosed in February 2015 he had a Gleason score 3+4 = 7 clinical stage TIc and a PSA of 6.8. 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   Prior Therapy: he is status post robotic prostatectomy in February 2015 with the pathology revealed a Gleason score 3+4 = 7 without any evidence of extraprostatic extension.  Current therapy: Observation and surveillance.  Interim History:  Mr. Cybulski presents today for a follow-up visit with his wife.Since the last visit, he reports episodic weakness and fatigue. He still ambulates without any difficulties. Has not reported any falls or syncope. Has not reported any chest pain or difficulty breathing. He is undergoing cardiac evaluation including echocardiogram as well as an EKG.  He has reported lower abdominal and pelvic discomfort at times but spontaneously resolves. He is not reporting any urinary symptoms or constitutional symptoms. He does not report any fevers or chills or sweats. His weight is actually is going up since the last visit.  Does not report any chest pain or palpitation. He does not report any headaches, blurry vision or syncope. He does not report any nausea, vomiting, change in his bowel habits. He does not report any skeletal complaints of arthralgias or myalgias. Rest of his review of systems unremarkable.  Medications: I have reviewed the patient's current medications.  Current Outpatient Prescriptions  Medication Sig Dispense Refill  . amLODipine (NORVASC) 10 MG tablet Take 10 mg by mouth every morning.    . hydrochlorothiazide (HYDRODIURIL) 25 MG tablet Take 25 mg by mouth daily.    . insulin aspart (NOVOLOG) 100 UNIT/ML injection Inject 40 Units into the skin 3 (three) times daily  with meals. When sugar is elevated    . insulin glargine (LANTUS) 100 UNIT/ML injection Inject 50 Units into the skin at bedtime.     . lansoprazole (PREVACID) 30 MG capsule Take 30 mg by mouth daily at 12 noon.    Marland Kitchen levothyroxine (SYNTHROID, LEVOTHROID) 50 MCG tablet Take 50 mcg by mouth daily before breakfast.     . losartan (COZAAR) 100 MG tablet Take 100 mg by mouth daily.    . mirtazapine (REMERON) 15 MG tablet Take 15 mg by mouth at bedtime.    . ONE TOUCH ULTRA TEST test strip Test 4 times daily 100 each 11  . potassium chloride SA (K-DUR,KLOR-CON) 20 MEQ tablet Take 20 mEq by mouth daily.    . sucralfate (CARAFATE) 1 G tablet Take 1 g by mouth 4 (four) times daily -  with meals and at bedtime.     No current facility-administered medications for this visit.     Allergies:  Allergies  Allergen Reactions  . Morphine Sulfate Itching and Rash    Past Medical History, Surgical history, Social history, and Family History were reviewed and updated.   Blood pressure 157/80, pulse 93, temperature 97.7 F (36.5 C), temperature source Oral, resp. rate 18, height 6' (1.829 m), weight 265 lb 3.2 oz (120.294 kg), SpO2 100 %. ECOG: 1 General appearance: alert and cooperative appeared well without distress. Head: Normocephalic, without obvious abnormality Neck: no adenopathy Lymph nodes: Cervical, supraclavicular, and axillary nodes normal. Heart:regular rate and rhythm, S1, S2 normal, no murmur, click, rub or gallop Lung:chest clear, no wheezing, rales, normal symmetric air entry. Abdomin: soft, non-tender, without  masses or organomegaly no shifting dullness or ascites. EXT:no erythema, induration, or nodules   Lab Results: Lab Results  Component Value Date   WBC 10.6* 10/10/2015   HGB 15.8 10/10/2015   HCT 46.8 10/10/2015   MCV 83.6 10/10/2015   PLT 215 10/10/2015     Chemistry      Component Value Date/Time   NA 141 10/10/2015 0902   NA 142 05/08/2015   NA 142 01/13/2014  1450   K 3.7 10/10/2015 0902   K 3.6 05/08/2015   CL 100 01/13/2014 1450   CO2 27 10/10/2015 0902   CO2 27 01/13/2014 1450   BUN 15.2 10/10/2015 0902   BUN 17 05/08/2015   BUN 16 01/13/2014 1450   CREATININE 1.1 10/10/2015 0902   CREATININE 1.2 05/08/2015   CREATININE 1.01 01/13/2014 1450   GLU 188 05/08/2015      Component Value Date/Time   CALCIUM 9.8 10/10/2015 0902   CALCIUM 9.8 01/13/2014 1450   ALKPHOS 48 10/10/2015 0902   ALKPHOS 56 11/16/2013 0815   AST 32 10/10/2015 0902   AST 24 06/10/2014   ALT 43 10/10/2015 0902   ALT 43* 06/10/2014   BILITOT 0.76 10/10/2015 0902   BILITOT 1.7* 11/16/2013 0815      Results for TALOR, CHEEMA (MRN [id number]) as of 10/10/2015 09:34  Ref. Range 05/08/2015 00:00  PSA Unknown 0.010     Impression and Plan:  74 year old gentleman with the following issues:  1. Prostate cancer diagnosed in February 2015. He presented with a PSA of 6.8 and status post robotic prostatectomy. He is currently under active surveillance with a PSA continues to be close to 0 in June 2016.  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 He does not have any symptoms at this time and his laboratory testing showed stable white cell count. His physical examination and laboratory testing do not indicate any progression of disease or symptomatic need for any treatment. I reviewed with him the indications for treatment for this condition would include constitutional symptoms of fevers, chills or progressive weight loss. Bulky and symptomatic palpable adenopathy would be also an indication to treat. He certainly doesn't have any of these  symptoms at this time and we have continued with observation and surveillance.  3. Generalized weakness: Do not think this is related to malignancy or blood disorder. Cardiac evaluation is ongoing.    [facility], [place], MD 11/8/20169:44 AM

## 2015-10-10 NOTE — Telephone Encounter (Signed)
Left message on voicemail in reference to upcoming appointment scheduled for 10/12/15. Phone number given for a call back so details instructions can be given. Cleo Santucci, Ranae [place]

## 2015-10-10 NOTE — Telephone Encounter (Signed)
Patient given detailed instructions per Myocardial Perfusion Study Information Sheet for the test on 10/12/2015 at 7:15. Patient notified to arrive 15 minutes early and that it is imperative to arrive on time for appointment to keep from having the test rescheduled.  If you need to cancel or reschedule your appointment, please call the office within 24 hours of your appointment. Failure to do so may result in a cancellation of your appointment, and a $50 no show fee. Patient verbalized understanding.EHK

## 2015-10-10 NOTE — Telephone Encounter (Signed)
per pof to sch pt appt-gave pt copy of avs °

## 2015-10-11 ENCOUNTER — Encounter (INDEPENDENT_AMBULATORY_CARE_PROVIDER_SITE_OTHER): Payer: Medicare Other | Admitting: Ophthalmology

## 2015-10-11 DIAGNOSIS — E113213 Type 2 diabetes mellitus with mild nonproliferative diabetic retinopathy with macular edema, bilateral: Secondary | ICD-10-CM | POA: Diagnosis not present

## 2015-10-11 DIAGNOSIS — E11311 Type 2 diabetes mellitus with unspecified diabetic retinopathy with macular edema: Secondary | ICD-10-CM | POA: Diagnosis not present

## 2015-10-11 DIAGNOSIS — H2513 Age-related nuclear cataract, bilateral: Secondary | ICD-10-CM

## 2015-10-11 DIAGNOSIS — H43813 Vitreous degeneration, bilateral: Secondary | ICD-10-CM | POA: Diagnosis not present

## 2015-10-11 DIAGNOSIS — H353231 Exudative age-related macular degeneration, bilateral, with active choroidal neovascularization: Secondary | ICD-10-CM | POA: Diagnosis not present

## 2015-10-11 DIAGNOSIS — I10 Essential (primary) hypertension: Secondary | ICD-10-CM

## 2015-10-11 DIAGNOSIS — M5136 Other intervertebral disc degeneration, lumbar region: Secondary | ICD-10-CM | POA: Diagnosis not present

## 2015-10-11 DIAGNOSIS — H35033 Hypertensive retinopathy, bilateral: Secondary | ICD-10-CM | POA: Diagnosis not present

## 2015-10-11 DIAGNOSIS — M5033 Other cervical disc degeneration, cervicothoracic region: Secondary | ICD-10-CM | POA: Diagnosis not present

## 2015-10-12 ENCOUNTER — Ambulatory Visit (HOSPITAL_COMMUNITY): Payer: Medicare Other | Attending: Cardiology

## 2015-10-12 DIAGNOSIS — R5383 Other fatigue: Secondary | ICD-10-CM

## 2015-10-12 DIAGNOSIS — R531 Weakness: Secondary | ICD-10-CM

## 2015-10-12 DIAGNOSIS — R0600 Dyspnea, unspecified: Secondary | ICD-10-CM

## 2015-10-12 DIAGNOSIS — R0609 Other forms of dyspnea: Secondary | ICD-10-CM

## 2015-10-12 DIAGNOSIS — E119 Type 2 diabetes mellitus without complications: Secondary | ICD-10-CM | POA: Insufficient documentation

## 2015-10-12 DIAGNOSIS — I10 Essential (primary) hypertension: Secondary | ICD-10-CM | POA: Insufficient documentation

## 2015-10-12 DIAGNOSIS — R0602 Shortness of breath: Secondary | ICD-10-CM

## 2015-10-12 LAB — MYOCARDIAL PERFUSION IMAGING
LV dias vol: 132 mL
LV sys vol: 50 mL
Peak HR: 86 {beats}/min
RATE: 0.34
Rest HR: 68 {beats}/min
SDS: 4
SRS: 1
SSS: 5
TID: 1

## 2015-10-12 MED ORDER — REGADENOSON 0.4 MG/5ML IV SOLN
0.4000 mg | Freq: Once | INTRAVENOUS | Status: AC
  Administered 2015-10-12: 0.4 mg via INTRAVENOUS

## 2015-10-12 MED ORDER — TECHNETIUM TC 99M SESTAMIBI GENERIC - CARDIOLITE
31.6000 | Freq: Once | INTRAVENOUS | Status: AC | PRN
  Administered 2015-10-12: 32 via INTRAVENOUS

## 2015-10-12 MED ORDER — TECHNETIUM TC 99M SESTAMIBI GENERIC - CARDIOLITE
10.1000 | Freq: Once | INTRAVENOUS | Status: AC | PRN
  Administered 2015-10-12: 10.1 via INTRAVENOUS

## 2015-10-16 ENCOUNTER — Ambulatory Visit (HOSPITAL_COMMUNITY): Payer: Medicare Other | Attending: Cardiovascular Disease

## 2015-10-16 ENCOUNTER — Other Ambulatory Visit: Payer: Self-pay

## 2015-10-16 DIAGNOSIS — E785 Hyperlipidemia, unspecified: Secondary | ICD-10-CM | POA: Insufficient documentation

## 2015-10-16 DIAGNOSIS — Z8249 Family history of ischemic heart disease and other diseases of the circulatory system: Secondary | ICD-10-CM | POA: Insufficient documentation

## 2015-10-16 DIAGNOSIS — Z6835 Body mass index (BMI) 35.0-35.9, adult: Secondary | ICD-10-CM | POA: Insufficient documentation

## 2015-10-16 DIAGNOSIS — R531 Weakness: Secondary | ICD-10-CM | POA: Diagnosis not present

## 2015-10-16 DIAGNOSIS — E669 Obesity, unspecified: Secondary | ICD-10-CM | POA: Diagnosis not present

## 2015-10-16 DIAGNOSIS — R0600 Dyspnea, unspecified: Secondary | ICD-10-CM

## 2015-10-16 DIAGNOSIS — G4733 Obstructive sleep apnea (adult) (pediatric): Secondary | ICD-10-CM | POA: Diagnosis not present

## 2015-10-16 DIAGNOSIS — R0609 Other forms of dyspnea: Secondary | ICD-10-CM

## 2015-10-16 DIAGNOSIS — Z87891 Personal history of nicotine dependence: Secondary | ICD-10-CM | POA: Diagnosis not present

## 2015-10-16 DIAGNOSIS — R0602 Shortness of breath: Secondary | ICD-10-CM | POA: Diagnosis not present

## 2015-10-16 DIAGNOSIS — I517 Cardiomegaly: Secondary | ICD-10-CM | POA: Insufficient documentation

## 2015-10-16 DIAGNOSIS — C9110 Chronic lymphocytic leukemia of B-cell type not having achieved remission: Secondary | ICD-10-CM | POA: Diagnosis not present

## 2015-10-16 DIAGNOSIS — C61 Malignant neoplasm of prostate: Secondary | ICD-10-CM | POA: Insufficient documentation

## 2015-10-16 DIAGNOSIS — R5383 Other fatigue: Secondary | ICD-10-CM | POA: Diagnosis not present

## 2015-10-16 DIAGNOSIS — E119 Type 2 diabetes mellitus without complications: Secondary | ICD-10-CM | POA: Insufficient documentation

## 2015-10-16 DIAGNOSIS — I10 Essential (primary) hypertension: Secondary | ICD-10-CM | POA: Insufficient documentation

## 2015-10-17 ENCOUNTER — Encounter (INDEPENDENT_AMBULATORY_CARE_PROVIDER_SITE_OTHER): Payer: Medicare Other | Admitting: Ophthalmology

## 2015-10-30 DIAGNOSIS — E113219 Type 2 diabetes mellitus with mild nonproliferative diabetic retinopathy with macular edema, unspecified eye: Secondary | ICD-10-CM | POA: Diagnosis not present

## 2015-10-30 DIAGNOSIS — E119 Type 2 diabetes mellitus without complications: Secondary | ICD-10-CM | POA: Diagnosis not present

## 2015-10-30 DIAGNOSIS — H35031 Hypertensive retinopathy, right eye: Secondary | ICD-10-CM | POA: Diagnosis not present

## 2015-10-30 DIAGNOSIS — H35032 Hypertensive retinopathy, left eye: Secondary | ICD-10-CM | POA: Diagnosis not present

## 2015-10-30 DIAGNOSIS — H25011 Cortical age-related cataract, right eye: Secondary | ICD-10-CM | POA: Diagnosis not present

## 2015-10-30 DIAGNOSIS — H35033 Hypertensive retinopathy, bilateral: Secondary | ICD-10-CM | POA: Diagnosis not present

## 2015-10-30 DIAGNOSIS — H2511 Age-related nuclear cataract, right eye: Secondary | ICD-10-CM | POA: Diagnosis not present

## 2015-10-30 DIAGNOSIS — H43813 Vitreous degeneration, bilateral: Secondary | ICD-10-CM | POA: Diagnosis not present

## 2015-10-30 DIAGNOSIS — E113211 Type 2 diabetes mellitus with mild nonproliferative diabetic retinopathy with macular edema, right eye: Secondary | ICD-10-CM | POA: Diagnosis not present

## 2015-10-30 DIAGNOSIS — E113212 Type 2 diabetes mellitus with mild nonproliferative diabetic retinopathy with macular edema, left eye: Secondary | ICD-10-CM | POA: Diagnosis not present

## 2015-11-14 DIAGNOSIS — H2511 Age-related nuclear cataract, right eye: Secondary | ICD-10-CM | POA: Diagnosis not present

## 2015-11-29 ENCOUNTER — Encounter: Payer: Self-pay | Admitting: *Deleted

## 2015-12-05 DIAGNOSIS — M5136 Other intervertebral disc degeneration, lumbar region: Secondary | ICD-10-CM | POA: Diagnosis not present

## 2015-12-05 DIAGNOSIS — M5030 Other cervical disc degeneration, unspecified cervical region: Secondary | ICD-10-CM | POA: Diagnosis not present

## 2015-12-08 ENCOUNTER — Encounter: Payer: Self-pay | Admitting: Family Medicine

## 2015-12-11 ENCOUNTER — Encounter: Payer: Self-pay | Admitting: Oncology

## 2015-12-11 NOTE — Progress Notes (Signed)
Pt mailed bill to Dr.Shadad asking was it billed correctly and stating they have 2 insurances and cannot pay anymore. He also stated he did call billing. I reviewed the billing notes and the account had been noted from where he called them and it is being researched. Called patient to inform him of this and right now he has a zero balance. Patient was appreciative of my phone call.

## 2015-12-12 ENCOUNTER — Encounter: Payer: Self-pay | Admitting: Family Medicine

## 2015-12-12 ENCOUNTER — Ambulatory Visit (INDEPENDENT_AMBULATORY_CARE_PROVIDER_SITE_OTHER): Payer: Medicare Other | Admitting: Family Medicine

## 2015-12-12 ENCOUNTER — Ambulatory Visit: Payer: Medicare Other | Admitting: Family Medicine

## 2015-12-12 VITALS — BP 122/72 | HR 111 | Temp 98.8°F | Wt 262.0 lb

## 2015-12-12 DIAGNOSIS — G8929 Other chronic pain: Secondary | ICD-10-CM | POA: Insufficient documentation

## 2015-12-12 DIAGNOSIS — M545 Low back pain, unspecified: Secondary | ICD-10-CM | POA: Insufficient documentation

## 2015-12-12 HISTORY — DX: Other chronic pain: M54.50

## 2015-12-12 HISTORY — DX: Other chronic pain: G89.29

## 2015-12-12 NOTE — Patient Instructions (Signed)
Let's try one more round of shots with Dr. Nelva Bush  If continued issues- see if Dr. Nelva Bush has any other suggestions. If not, let's refer to pain management Dr. Catheryn Bacon to see if any other potential management optiosn to help you  So sorry you are hurting more- we will try to find a solution

## 2015-12-12 NOTE — Assessment & Plan Note (Signed)
S: Intermittent issues since 2002 at least (lumbar films noted then). Up to 10/10 pain in left low back radiating to the left and into the left leg as well. MRI lumbar spine as recently as 09/2015  "1. At L3-4 there is mild right facet arthropathy with focal right ligamentum flavum infolding resulting in right lateral recess stenosis. 2. At L2-3 there is a mild broad-based disc bulge with mild bilateral facet arthropathy and mild spinal stenosis."  Dr. Nelva Bush has not thought surgical intervention would likely benefit patient.  2 recent injectoins from Dr. Nelva Bush. Previously gave him reasonable relief but most recently has not. Sitting is better, walking is worst level of pain. Back brace not helping. Reports mild Left leg weakness but no pain. No incontinence. Tramadol not helping. Wife has seen [facility]- Pain management- Dr. Catheryn Bacon. Patient would like to consider this option.  A/P: Advised 1 more round of injections then if continued issues (refer to pain management Dr. Catheryn Bacon of [facility] in [facility] per patient preference. Doubt Prostate cancer at play here with recent MRI and close follow up with Dr. Roni Bread. Extended time counseling for patient struggling with chronic pain.

## 2015-12-12 NOTE — Progress Notes (Signed)
[name], MD  Subjective:  [name] is a 75 y.o. year old very pleasant male patient who presents for/with See problem oriented charting ROS- no fever, chills, nausea, vomiting. No falls. No numbness/tingling into buttocks or legs.  Past Medical History-  Patient Active Problem List   Diagnosis Date Noted  . Chronic low back pain 12/12/2015    Priority: High  . Chronic lymphocytic leukemia ([place]) 05/16/2014    Priority: High  . Prostate cancer ([place]) 01/24/2014    Priority: High  . Diabetes mellitus type 2, controlled ([place]) 09/24/2007    Priority: High  . Hypothyroidism     Priority: Medium  . 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 Depression 09/21/2009    Priority: Medium  . Diabetic retinopathy ([place]) 09/24/2007    Priority: Medium  . Hyperlipidemia 09/24/2007    Priority: Medium  . OSA on CPAP 09/24/2007    Priority: Medium  . Essential hypertension 09/24/2007    Priority: Medium  . Essential tremor 08/11/2015    Priority: Low  . GERD (gastroesophageal reflux disease) 12/08/2014    Priority: Low  . Obesity 10/25/2008    Priority: Low  . History of colonic polyps 08/22/2008    Priority: Low    Medications- reviewed and updated Current Outpatient Prescriptions  Medication Sig Dispense Refill  . amLODipine (NORVASC) 10 MG tablet Take 10 mg by mouth every morning.    . hydrochlorothiazide (HYDRODIURIL) 25 MG tablet Take 25 mg by mouth daily.    . insulin aspart (NOVOLOG) 100 UNIT/ML injection Inject 40 Units into the skin 3 (three) times daily with meals. When sugar is elevated    . insulin glargine (LANTUS) 100 UNIT/ML injection Inject 50 Units into the skin at bedtime.     . lansoprazole (PREVACID) 30 MG capsule Take 30 mg by mouth daily at 12 noon.    Marland Kitchen levothyroxine (SYNTHROID, LEVOTHROID) 50 MCG tablet Take 50 mcg by mouth daily before breakfast.     . losartan (COZAAR) 100 MG tablet Take 100 mg by mouth daily.    . mirtazapine  (REMERON) 15 MG tablet Take 15 mg by mouth at bedtime.    . ONE TOUCH ULTRA TEST test strip Test 4 times daily 100 each 11  . potassium chloride SA (K-DUR,KLOR-CON) 20 MEQ tablet Take 20 mEq by mouth daily.    . sucralfate (CARAFATE) 1 G tablet Take 1 g by mouth 4 (four) times daily -  with meals and at bedtime.     No current facility-administered medications for this visit.    Objective: BP 122/72 mmHg  Pulse 111  Temp(Src) 98.8 F (37.1 C)  Wt 262 lb (118.842 kg) Gen: NAD, resting comfortably CV: RRR no murmurs rubs or gallops Lungs: CTAB no crackles, wheeze, rhonchi Abdomen: soft/nontender/nondistended/normal bowel sounds.   Ext: no edema MSK: Pain in left low back with palpation Skin: warm, dry, no rash over back Neuro: grossly normal, moves all extremities, 5/5 strength lower extremity pain  Assessment/Plan:  Chronic low back pain S: Intermittent issues since 2002 at least (lumbar films noted then). Up to 10/10 pain in left low back radiating to the left and into the left leg as well. MRI lumbar spine as recently as 09/2015  "1. At L3-4 there is mild right facet arthropathy with focal right ligamentum flavum infolding resulting in right lateral recess stenosis. 2. At L2-3 there is a mild broad-based disc bulge with mild bilateral facet arthropathy and  mild spinal stenosis."  Dr. Nelva Bush has not thought surgical intervention would likely benefit patient.  2 recent injectoins from Dr. Nelva Bush. Previously gave him reasonable relief but most recently has not. Sitting is better, walking is worst level of pain. Back brace not helping. Reports mild Left leg weakness but no pain. No incontinence. Tramadol not helping. Wife has seen [facility]- [facility] management- Dr. Catheryn Bacon. Patient would like to consider this option.  A/P: Advised 1 more round of injections then if continued issues (refer to pain management Dr. Catheryn Bacon of [facility] in [facility] per patient preference.  Doubt Prostate cancer at play here with recent MRI and close follow up with Dr. Roni Bread. Extended time counseling for patient struggling with chronic pain.     Return precautions advised.   >50% of 25 minute office visit was spent on counseling (options for management, dealing with pain, recommending therapy for dealing with ongoing stress of chronic pain- declines) and coordination of care

## 2015-12-14 ENCOUNTER — Encounter (INDEPENDENT_AMBULATORY_CARE_PROVIDER_SITE_OTHER): Payer: Medicare Other | Admitting: Ophthalmology

## 2015-12-14 DIAGNOSIS — E113213 Type 2 diabetes mellitus with mild nonproliferative diabetic retinopathy with macular edema, bilateral: Secondary | ICD-10-CM | POA: Diagnosis not present

## 2015-12-14 DIAGNOSIS — H35033 Hypertensive retinopathy, bilateral: Secondary | ICD-10-CM | POA: Diagnosis not present

## 2015-12-14 DIAGNOSIS — H2512 Age-related nuclear cataract, left eye: Secondary | ICD-10-CM | POA: Diagnosis not present

## 2015-12-14 DIAGNOSIS — E11311 Type 2 diabetes mellitus with unspecified diabetic retinopathy with macular edema: Secondary | ICD-10-CM | POA: Diagnosis not present

## 2015-12-14 DIAGNOSIS — H353231 Exudative age-related macular degeneration, bilateral, with active choroidal neovascularization: Secondary | ICD-10-CM

## 2015-12-14 DIAGNOSIS — H43813 Vitreous degeneration, bilateral: Secondary | ICD-10-CM

## 2015-12-14 DIAGNOSIS — I10 Essential (primary) hypertension: Secondary | ICD-10-CM | POA: Diagnosis not present

## 2015-12-15 DIAGNOSIS — M5416 Radiculopathy, lumbar region: Secondary | ICD-10-CM | POA: Diagnosis not present

## 2015-12-15 DIAGNOSIS — M4726 Other spondylosis with radiculopathy, lumbar region: Secondary | ICD-10-CM | POA: Diagnosis not present

## 2015-12-15 DIAGNOSIS — M545 Low back pain: Secondary | ICD-10-CM | POA: Diagnosis not present

## 2015-12-18 DIAGNOSIS — H2512 Age-related nuclear cataract, left eye: Secondary | ICD-10-CM | POA: Diagnosis not present

## 2015-12-18 DIAGNOSIS — H25012 Cortical age-related cataract, left eye: Secondary | ICD-10-CM | POA: Diagnosis not present

## 2015-12-22 ENCOUNTER — Other Ambulatory Visit: Payer: Self-pay | Admitting: Family Medicine

## 2015-12-22 ENCOUNTER — Encounter: Payer: Self-pay | Admitting: Family Medicine

## 2015-12-22 DIAGNOSIS — M545 Low back pain, unspecified: Secondary | ICD-10-CM

## 2015-12-22 DIAGNOSIS — G8929 Other chronic pain: Secondary | ICD-10-CM

## 2015-12-26 DIAGNOSIS — H2512 Age-related nuclear cataract, left eye: Secondary | ICD-10-CM | POA: Diagnosis not present

## 2016-01-04 DIAGNOSIS — M47816 Spondylosis without myelopathy or radiculopathy, lumbar region: Secondary | ICD-10-CM | POA: Diagnosis not present

## 2016-01-04 DIAGNOSIS — Z79899 Other long term (current) drug therapy: Secondary | ICD-10-CM | POA: Diagnosis not present

## 2016-01-04 DIAGNOSIS — Z5181 Encounter for therapeutic drug level monitoring: Secondary | ICD-10-CM | POA: Diagnosis not present

## 2016-01-05 ENCOUNTER — Encounter (INDEPENDENT_AMBULATORY_CARE_PROVIDER_SITE_OTHER): Payer: Medicare Other | Admitting: Ophthalmology

## 2016-01-05 DIAGNOSIS — E113213 Type 2 diabetes mellitus with mild nonproliferative diabetic retinopathy with macular edema, bilateral: Secondary | ICD-10-CM | POA: Diagnosis not present

## 2016-01-05 DIAGNOSIS — H43813 Vitreous degeneration, bilateral: Secondary | ICD-10-CM | POA: Diagnosis not present

## 2016-01-05 DIAGNOSIS — I10 Essential (primary) hypertension: Secondary | ICD-10-CM | POA: Diagnosis not present

## 2016-01-05 DIAGNOSIS — H35033 Hypertensive retinopathy, bilateral: Secondary | ICD-10-CM

## 2016-01-05 DIAGNOSIS — H353231 Exudative age-related macular degeneration, bilateral, with active choroidal neovascularization: Secondary | ICD-10-CM | POA: Diagnosis not present

## 2016-01-05 DIAGNOSIS — H2512 Age-related nuclear cataract, left eye: Secondary | ICD-10-CM

## 2016-01-05 DIAGNOSIS — M47816 Spondylosis without myelopathy or radiculopathy, lumbar region: Secondary | ICD-10-CM | POA: Diagnosis not present

## 2016-01-05 DIAGNOSIS — E11311 Type 2 diabetes mellitus with unspecified diabetic retinopathy with macular edema: Secondary | ICD-10-CM | POA: Diagnosis not present

## 2016-01-11 ENCOUNTER — Encounter (INDEPENDENT_AMBULATORY_CARE_PROVIDER_SITE_OTHER): Payer: Medicare Other | Admitting: Ophthalmology

## 2016-01-11 DIAGNOSIS — H353211 Exudative age-related macular degeneration, right eye, with active choroidal neovascularization: Secondary | ICD-10-CM | POA: Diagnosis not present

## 2016-01-12 ENCOUNTER — Encounter (INDEPENDENT_AMBULATORY_CARE_PROVIDER_SITE_OTHER): Payer: Medicare Other | Admitting: Ophthalmology

## 2016-01-15 ENCOUNTER — Encounter (INDEPENDENT_AMBULATORY_CARE_PROVIDER_SITE_OTHER): Payer: Medicare Other | Admitting: Ophthalmology

## 2016-02-01 DIAGNOSIS — M47816 Spondylosis without myelopathy or radiculopathy, lumbar region: Secondary | ICD-10-CM | POA: Insufficient documentation

## 2016-02-01 DIAGNOSIS — G894 Chronic pain syndrome: Secondary | ICD-10-CM | POA: Insufficient documentation

## 2016-02-01 HISTORY — DX: Spondylosis without myelopathy or radiculopathy, lumbar region: M47.816

## 2016-02-01 HISTORY — DX: Chronic pain syndrome: G89.4

## 2016-02-02 DIAGNOSIS — Z8546 Personal history of malignant neoplasm of prostate: Secondary | ICD-10-CM | POA: Diagnosis not present

## 2016-02-08 ENCOUNTER — Encounter: Payer: Self-pay | Admitting: Family Medicine

## 2016-02-08 ENCOUNTER — Encounter (INDEPENDENT_AMBULATORY_CARE_PROVIDER_SITE_OTHER): Payer: Medicare Other | Admitting: Ophthalmology

## 2016-02-08 DIAGNOSIS — E113212 Type 2 diabetes mellitus with mild nonproliferative diabetic retinopathy with macular edema, left eye: Secondary | ICD-10-CM | POA: Diagnosis not present

## 2016-02-08 DIAGNOSIS — H35033 Hypertensive retinopathy, bilateral: Secondary | ICD-10-CM

## 2016-02-08 DIAGNOSIS — E113311 Type 2 diabetes mellitus with moderate nonproliferative diabetic retinopathy with macular edema, right eye: Secondary | ICD-10-CM

## 2016-02-08 DIAGNOSIS — I10 Essential (primary) hypertension: Secondary | ICD-10-CM

## 2016-02-08 DIAGNOSIS — H43813 Vitreous degeneration, bilateral: Secondary | ICD-10-CM

## 2016-02-08 DIAGNOSIS — H353231 Exudative age-related macular degeneration, bilateral, with active choroidal neovascularization: Secondary | ICD-10-CM | POA: Diagnosis not present

## 2016-02-08 DIAGNOSIS — E11311 Type 2 diabetes mellitus with unspecified diabetic retinopathy with macular edema: Secondary | ICD-10-CM | POA: Diagnosis not present

## 2016-02-08 LAB — HM DIABETES EYE EXAM

## 2016-02-12 DIAGNOSIS — R6882 Decreased libido: Secondary | ICD-10-CM | POA: Diagnosis not present

## 2016-02-12 DIAGNOSIS — Z8546 Personal history of malignant neoplasm of prostate: Secondary | ICD-10-CM | POA: Diagnosis not present

## 2016-02-12 DIAGNOSIS — N5231 Erectile dysfunction following radical prostatectomy: Secondary | ICD-10-CM | POA: Diagnosis not present

## 2016-02-12 DIAGNOSIS — N393 Stress incontinence (female) (male): Secondary | ICD-10-CM | POA: Diagnosis not present

## 2016-02-12 DIAGNOSIS — Z0000 Encounter for general adult medical examination without abnormal findings: Secondary | ICD-10-CM | POA: Diagnosis not present

## 2016-02-13 ENCOUNTER — Encounter: Payer: Self-pay | Admitting: *Deleted

## 2016-02-15 ENCOUNTER — Encounter (INDEPENDENT_AMBULATORY_CARE_PROVIDER_SITE_OTHER): Payer: Medicare Other | Admitting: Ophthalmology

## 2016-02-27 LAB — MICROALBUMIN, URINE: Microalb, Ur: 2.58

## 2016-02-27 LAB — HEMOGLOBIN A1C: Hemoglobin A1C: 6.5

## 2016-02-27 LAB — PSA: PSA: 0.01

## 2016-02-27 LAB — CBC AND DIFFERENTIAL
HCT: 46 % (ref 41–53)
Hemoglobin: 16.2 g/dL (ref 13.5–17.5)
WBC: 11.4 10^3/mL

## 2016-02-27 LAB — TSH: TSH: 1.48 u[IU]/mL (ref 0.41–5.90)

## 2016-02-29 DIAGNOSIS — M47816 Spondylosis without myelopathy or radiculopathy, lumbar region: Secondary | ICD-10-CM | POA: Diagnosis not present

## 2016-03-11 DIAGNOSIS — L570 Actinic keratosis: Secondary | ICD-10-CM | POA: Diagnosis not present

## 2016-03-11 DIAGNOSIS — D239 Other benign neoplasm of skin, unspecified: Secondary | ICD-10-CM | POA: Diagnosis not present

## 2016-03-11 DIAGNOSIS — L821 Other seborrheic keratosis: Secondary | ICD-10-CM | POA: Diagnosis not present

## 2016-03-20 ENCOUNTER — Ambulatory Visit (INDEPENDENT_AMBULATORY_CARE_PROVIDER_SITE_OTHER): Payer: Medicare Other | Admitting: Family Medicine

## 2016-03-20 ENCOUNTER — Encounter: Payer: Self-pay | Admitting: Family Medicine

## 2016-03-20 ENCOUNTER — Telehealth: Payer: Self-pay | Admitting: Family Medicine

## 2016-03-20 VITALS — BP 140/70 | HR 99 | Temp 98.4°F | Wt 260.0 lb

## 2016-03-20 DIAGNOSIS — Z794 Long term (current) use of insulin: Secondary | ICD-10-CM | POA: Diagnosis not present

## 2016-03-20 DIAGNOSIS — E785 Hyperlipidemia, unspecified: Secondary | ICD-10-CM | POA: Diagnosis not present

## 2016-03-20 DIAGNOSIS — I10 Essential (primary) hypertension: Secondary | ICD-10-CM

## 2016-03-20 DIAGNOSIS — E119 Type 2 diabetes mellitus without complications: Secondary | ICD-10-CM

## 2016-03-20 MED ORDER — ATORVASTATIN CALCIUM 40 MG PO TABS
40.0000 mg | ORAL_TABLET | ORAL | Status: DC
Start: 2016-03-20 — End: 2016-04-12

## 2016-03-20 MED ORDER — CHLORTHALIDONE 25 MG PO TABS: 25.0000 mg | ORAL_TABLET | Freq: Every day | ORAL | Status: DC

## 2016-03-20 NOTE — Progress Notes (Signed)
David Reddish, MD  Subjective:  [name] is a 75 y.o. year old very pleasant male patient who presents for/with See problem oriented charting ROS- No chest pain. No headache or blurry vision. Occasional low blood sugars- hungry, anxious.    Past Medical History-  Patient Active Problem List   Diagnosis Date Noted  . Chronic low back pain 12/12/2015    Priority: High  . Chronic lymphocytic leukemia ([place]) 05/16/2014    Priority: High  . Prostate cancer ([place]) 01/24/2014    Priority: High  . Diabetes mellitus type 2, controlled ([place]) 09/24/2007    Priority: High  . Hypothyroidism     Priority: Medium  . 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 Depression 09/21/2009    Priority: Medium  . Diabetic retinopathy ([place]) 09/24/2007    Priority: Medium  . Hyperlipidemia 09/24/2007    Priority: Medium  . OSA on CPAP 09/24/2007    Priority: Medium  . Essential hypertension 09/24/2007    Priority: Medium  . Essential tremor 08/11/2015    Priority: Low  . GERD (gastroesophageal reflux disease) 12/08/2014    Priority: Low  . Obesity 10/25/2008    Priority: Low  . History of colonic polyps 08/22/2008    Priority: Low    Medications- reviewed and updated Current Outpatient Prescriptions  Medication Sig Dispense Refill  . amLODipine (NORVASC) 10 MG tablet Take 10 mg by mouth every morning.    . insulin aspart (NOVOLOG) 100 UNIT/ML injection Inject 40 Units into the skin 3 (three) times daily with meals. When sugar is elevated    . insulin glargine (LANTUS) 100 UNIT/ML injection Inject 50 Units into the skin at bedtime.     . lansoprazole (PREVACID) 30 MG capsule Take 30 mg by mouth daily at 12 noon.    Marland Kitchen levothyroxine (SYNTHROID, LEVOTHROID) 50 MCG tablet Take 50 mcg by mouth daily before breakfast.     . losartan (COZAAR) 100 MG tablet Take 100 mg by mouth daily.    . mirtazapine (REMERON) 15 MG tablet Take 15 mg by mouth at bedtime.    . ONE TOUCH ULTRA TEST  test strip Test 4 times daily 100 each 11  . potassium chloride SA (K-DUR,KLOR-CON) 20 MEQ tablet Take 20 mEq by mouth daily.    . sucralfate (CARAFATE) 1 G tablet Take 1 g by mouth 4 (four) times daily -  with meals and at bedtime.    Marland Kitchen atorvastatin (LIPITOR) 40 MG tablet Take 1 tablet (40 mg total) by mouth once a week. 30 tablet 3  . chlorthalidone (HYGROTON) 25 MG tablet Take 1 tablet (25 mg total) by mouth daily. 90 tablet 3   No current facility-administered medications for this visit.    Objective: BP 140/70 mmHg  Pulse 99  Temp(Src) 98.4 F (36.9 C)  Wt 260 lb (117.935 kg) Gen: NAD, resting comfortably CV: RRR no murmurs rubs or gallops Lungs: CTAB no crackles, wheeze, rhonchi Abdomen: soft/nontender/nondistended/normal bowel sounds. No rebound or guarding.  Ext: 1+ edema Skin: warm, dry Neuro: grossly normal, moves all extremities  Assessment/Plan:  Diabetes mellitus type 2, controlled ([place]) S: well controlled. On lantus 100 units, novolog 40-60 units with meals. He self adjusts up to 60. Does not always take insulin or sugar level before meals Does have occasional lows, not able to indicate how often.  Exercise and diet- limited exercise by low back pain  Lab Results  Component Value Date   HGBA1C 6.5  02/27/2016   HGBA1C 6.6* 08/11/2015   HGBA1C 6.6* 05/08/2015   A/P: controlled but want to avoid any hypoglycemia- advised to stick closer to 40 units, log sugars and bring to follow up- may need more custom sliding scale. Focus more on checking before meal so we can determine amount of insulin to give   Hyperlipidemia S: poorly controlled on no rx with triglycerides nearly 500. No myalgias.  Lab Results  Component Value Date   CHOL 170 05/08/2015   HDL 47 05/08/2015   LDLCALC 95 05/08/2015   LDLDIRECT 90.6 11/16/2013   TRIG 495* 05/08/2015   CHOLHDL 4 11/16/2013   A/P: agreeable to trial atorvastatin 40mg  once a week- previously with myalgias on daily  statin    Essential hypertension S: poorly controlled on amodipine 10mg , hctz 25mg , losartan 100mg  with home readings in 140s and 150s BP Readings from Last 3 Encounters:  03/20/16 140/70  12/12/15 122/72  10/10/15 157/80  A/P:Continue current meds:  Except change hctz to chlorthalidone. Could also consider bystolic.    Return in about 3 weeks (around 04/10/2016). Return precautions advised.    Meds ordered this encounter  Medications  . chlorthalidone (HYGROTON) 25 MG tablet    Sig: Take 1 tablet (25 mg total) by mouth daily.    Dispense:  90 tablet    Refill:  3  . atorvastatin (LIPITOR) 40 MG tablet    Sig: Take 1 tablet (40 mg total) by mouth once a week.    Dispense:  30 tablet    Refill:  3

## 2016-03-20 NOTE — Assessment & Plan Note (Signed)
S: well controlled. On lantus 100 units, novolog 40-60 units with meals. He self adjusts up to 60. Does not always take insulin or sugar level before meals Does have occasional lows, not able to indicate how often.  Exercise and diet- limited exercise by low back pain  Lab Results  Component Value Date   HGBA1C 6.5 02/27/2016   HGBA1C 6.6* 08/11/2015   HGBA1C 6.6* 05/08/2015   A/P: controlled but want to avoid any hypoglycemia- advised to stick closer to 40 units, log sugars and bring to follow up- may need more custom sliding scale. Focus more on checking before meal so we can determine amount of insulin to give

## 2016-03-20 NOTE — Assessment & Plan Note (Signed)
S: poorly controlled on amodipine 10mg , hctz 25mg , losartan 100mg  with home readings in 140s and 150s BP Readings from Last 3 Encounters:  03/20/16 140/70  12/12/15 122/72  10/10/15 157/80  A/P:Continue current meds:  Except change hctz to chlorthalidone. Could also consider bystolic.

## 2016-03-20 NOTE — Telephone Encounter (Signed)
Pharmacy called to clarify instructions for pt's   atorvastatin (LIPITOR) 40 MG tablet   Take 1 tablet (40 mg total) by mouth once a week. 30 tablet        [facility] [place]

## 2016-03-20 NOTE — Assessment & Plan Note (Signed)
S: poorly controlled on no rx with triglycerides nearly 500. No myalgias.  Lab Results  Component Value Date   CHOL 170 05/08/2015   HDL 47 05/08/2015   LDLCALC 95 05/08/2015   LDLDIRECT 90.6 11/16/2013   TRIG 495* 05/08/2015   CHOLHDL 4 11/16/2013   A/P: agreeable to trial atorvastatin 40mg  once a week- previously with myalgias on daily statin

## 2016-03-20 NOTE — Patient Instructions (Signed)
I would try to stick closer to the 40 units of novolog- let's try to avoid any lows.   Stop hydrochlorothiazide, start chlorthalidone in its place- both 25mg   Try atorvastatin once a week for cholesterol  Your blood pressure trend concerns me. I would like for you to buy/use a home cuff to check at least 4x a week. Your goal is <140/90.Bring your home cuff and your log of blood pressures with you to visit.

## 2016-03-21 NOTE — Telephone Encounter (Signed)
Called and calrified with pt pharmacy.

## 2016-03-22 ENCOUNTER — Telehealth: Payer: Self-pay | Admitting: Family Medicine

## 2016-03-22 MED ORDER — ONETOUCH ULTRA BLUE VI STRP
ORAL_STRIP | Status: DC
Start: 2016-03-22 — End: 2016-03-25

## 2016-03-22 NOTE — Telephone Encounter (Signed)
Pt need refill for Rx of Tru Balance test strips 100 count

## 2016-03-22 NOTE — Telephone Encounter (Signed)
Rx sent in

## 2016-03-25 MED ORDER — ONETOUCH ULTRA BLUE VI STRP
ORAL_STRIP | Status: DC
Start: 2016-03-25 — End: 2017-04-02

## 2016-03-25 NOTE — Telephone Encounter (Signed)
Rx sent to [facility]

## 2016-03-25 NOTE — Telephone Encounter (Signed)
rx needs to go to [facility]

## 2016-03-25 NOTE — Addendum Note (Signed)
Addended by: Clyde Lundborg A on: 03/25/2016 10:32 AM   Modules accepted: Orders

## 2016-03-28 DIAGNOSIS — M47816 Spondylosis without myelopathy or radiculopathy, lumbar region: Secondary | ICD-10-CM | POA: Diagnosis not present

## 2016-03-28 DIAGNOSIS — G894 Chronic pain syndrome: Secondary | ICD-10-CM | POA: Diagnosis not present

## 2016-04-09 ENCOUNTER — Other Ambulatory Visit (HOSPITAL_BASED_OUTPATIENT_CLINIC_OR_DEPARTMENT_OTHER): Payer: Medicare Other

## 2016-04-09 ENCOUNTER — Telehealth: Payer: Self-pay | Admitting: Oncology

## 2016-04-09 ENCOUNTER — Ambulatory Visit (HOSPITAL_BASED_OUTPATIENT_CLINIC_OR_DEPARTMENT_OTHER): Payer: Medicare Other | Admitting: Oncology

## 2016-04-09 VITALS — BP 119/87 | HR 116 | Temp 97.9°F | Resp 18 | Ht 72.0 in | Wt 260.4 lb

## 2016-04-09 DIAGNOSIS — C9110 Chronic lymphocytic leukemia of B-cell type not having achieved remission: Secondary | ICD-10-CM

## 2016-04-09 DIAGNOSIS — Z8546 Personal history of malignant neoplasm of prostate: Secondary | ICD-10-CM | POA: Diagnosis not present

## 2016-04-09 DIAGNOSIS — M545 Low back pain: Secondary | ICD-10-CM

## 2016-04-09 DIAGNOSIS — C61 Malignant neoplasm of prostate: Secondary | ICD-10-CM

## 2016-04-09 LAB — CBC WITH DIFFERENTIAL/PLATELET
BASO%: 0.4 % (ref 0.0–2.0)
Basophils Absolute: 0.1 10*3/uL (ref 0.0–0.1)
EOS%: 1.8 % (ref 0.0–7.0)
Eosinophils Absolute: 0.2 10*3/uL (ref 0.0–0.5)
HCT: 49.7 % (ref 38.4–49.9)
HGB: 16.6 g/dL (ref 13.0–17.1)
LYMPH%: 39.9 % (ref 14.0–49.0)
MCH: 27.9 pg (ref 27.2–33.4)
MCHC: 33.5 g/dL (ref 32.0–36.0)
MCV: 83.4 fL (ref 79.3–98.0)
MONO#: 0.9 10*3/uL (ref 0.1–0.9)
MONO%: 6.6 % (ref 0.0–14.0)
NEUT#: 7 10*3/uL — ABNORMAL HIGH (ref 1.5–6.5)
NEUT%: 51.3 % (ref 39.0–75.0)
Platelets: 273 10*3/uL (ref 140–400)
RBC: 5.96 10*6/uL — ABNORMAL HIGH (ref 4.20–5.82)
RDW: 15.1 % — ABNORMAL HIGH (ref 11.0–14.6)
WBC: 13.6 10*3/uL — ABNORMAL HIGH (ref 4.0–10.3)
lymph#: 5.4 10*3/uL — ABNORMAL HIGH (ref 0.9–3.3)

## 2016-04-09 LAB — COMPREHENSIVE METABOLIC PANEL
ALT: 34 U/L (ref 0–55)
AST: 27 U/L (ref 5–34)
Albumin: 4.1 g/dL (ref 3.5–5.0)
Alkaline Phosphatase: 44 U/L (ref 40–150)
Anion Gap: 11 mEq/L (ref 3–11)
BUN: 16.9 mg/dL (ref 7.0–26.0)
CO2: 28 mEq/L (ref 22–29)
Calcium: 9.5 mg/dL (ref 8.4–10.4)
Chloride: 101 mEq/L (ref 98–109)
Creatinine: 1.2 mg/dL (ref 0.7–1.3)
EGFR: 60 mL/min/{1.73_m2} — ABNORMAL LOW (ref >90)
Glucose: 185 mg/dl — ABNORMAL HIGH (ref 70–140)
Potassium: 3.3 mEq/L — ABNORMAL LOW (ref 3.5–5.1)
Sodium: 140 mEq/L (ref 136–145)
Total Bilirubin: 1.21 mg/dL — ABNORMAL HIGH (ref 0.20–1.20)
Total Protein: 7 g/dL (ref 6.4–8.3)

## 2016-04-09 NOTE — Telephone Encounter (Signed)
Gave and printed appt sched and avs fo rpt; for NOV  °

## 2016-04-09 NOTE — Progress Notes (Signed)
Hematology and Oncology Follow Up Visit  [name]:[id number] 09-Jun-1941 75 y.o. 04/09/2016 9:04 AM [name], MDHunter, [name], MD   Principle Diagnosis: 75 year old gentleman with prostate cancer diagnosed in February 2015 he had a Gleason score 3+4 = 7 clinical stage TIc and a PSA of 6.8. 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   Prior Therapy: he is status post robotic prostatectomy in February 2015 with the pathology revealed a Gleason score 3+4 = 7 without any evidence of extraprostatic extension.  Current therapy: Observation and surveillance.  Interim History:  [name] presents today for a follow-up visit with his wife.Since the last visit, he reports no major changes in his health. He continues to have back pain issues that are unrelated to his prostate cancer or SLL/CLL. He denied any fevers or chills. His appetite remain excellent and have gained weight. Has not reported any falls or syncope. Has not reported any chest pain or difficulty breathing. He denied any abdominal pain or early satiety. He denied any urination difficulties.   Does not report any chest pain or palpitation. He does not report any headaches, blurry vision or syncope. He does not report any nausea, vomiting, change in his bowel habits. He does not report any skeletal complaints of arthralgias or myalgias. Rest of his review of systems unremarkable.  Medications: I have reviewed the patient's current medications.  Current Outpatient Prescriptions  Medication Sig Dispense Refill  . amLODipine (NORVASC) 10 MG tablet Take 10 mg by mouth every morning.    Marland Kitchen atorvastatin (LIPITOR) 40 MG tablet Take 1 tablet (40 mg total) by mouth once a week. 30 tablet 3  . chlorthalidone (HYGROTON) 25 MG tablet Take 1 tablet (25 mg total) by mouth daily. 90 tablet 3  . insulin aspart (NOVOLOG) 100 UNIT/ML injection Inject 40 Units into the skin 3 (three) times daily with meals.  When sugar is elevated    . insulin glargine (LANTUS) 100 UNIT/ML injection Inject 50 Units into the skin at bedtime.     . lansoprazole (PREVACID) 30 MG capsule Take 30 mg by mouth daily at 12 noon.    Marland Kitchen levothyroxine (SYNTHROID, LEVOTHROID) 50 MCG tablet Take 50 mcg by mouth daily before breakfast.     . losartan (COZAAR) 100 MG tablet Take 100 mg by mouth daily.    . mirtazapine (REMERON) 15 MG tablet Take 15 mg by mouth at bedtime.    . ONE TOUCH ULTRA TEST test strip Test 4 times daily. Dx: E11.9 100 each 11  . potassium chloride SA (K-DUR,KLOR-CON) 20 MEQ tablet Take 20 mEq by mouth daily.    . sucralfate (CARAFATE) 1 G tablet Take 1 g by mouth 4 (four) times daily -  with meals and at bedtime.     No current facility-administered medications for this visit.     Allergies:  Allergies  Allergen Reactions  . Morphine Sulfate Itching and Rash    Past Medical History, Surgical history, Social history, and Family History were reviewed and updated.   Blood pressure 119/87, pulse 116, temperature 97.9 F (36.6 C), temperature source Oral, resp. rate 18, height 6' (1.829 m), weight 260 lb 6.4 oz (118.117 kg), SpO2 98 %. ECOG: 1 General appearance: alert and cooperative appeared without distress.  Head: Normocephalic, without obvious abnormality Neck: no adenopathy Lymph nodes: Cervical, supraclavicular, and axillary nodes normal. Heart:regular rate and rhythm, S1, S2 normal, no murmur, click, rub or gallop Lung:chest clear, no  wheezing, rales, normal symmetric air entry. Abdomin: soft, non-tender, without masses or organomegaly no rebound or guarding. EXT:no erythema, induration, or nodules   Lab Results: Lab Results  Component Value Date   WBC 13.6* 04/09/2016   HGB 16.6 04/09/2016   HCT 49.7 04/09/2016   MCV 83.4 04/09/2016   PLT 273 04/09/2016     Chemistry      Component Value Date/Time   NA 141 10/10/2015 0902   NA 142 05/08/2015   NA 142 01/13/2014 1450   K 3.7  10/10/2015 0902   K 3.6 05/08/2015   CL 100 01/13/2014 1450   CO2 27 10/10/2015 0902   CO2 27 01/13/2014 1450   BUN 15.2 10/10/2015 0902   BUN 17 05/08/2015   BUN 16 01/13/2014 1450   CREATININE 1.1 10/10/2015 0902   CREATININE 1.2 05/08/2015   CREATININE 1.01 01/13/2014 1450   GLU 188 05/08/2015      Component Value Date/Time   CALCIUM 9.8 10/10/2015 0902   CALCIUM 9.8 01/13/2014 1450   ALKPHOS 48 10/10/2015 0902   ALKPHOS 56 11/16/2013 0815   AST 32 10/10/2015 0902   AST 24 06/10/2014   ALT 43 10/10/2015 0902   ALT 43* 06/10/2014   BILITOT 0.76 10/10/2015 0902   BILITOT 1.7* 11/16/2013 0815       PSA Unknown 0.010     Impression and Plan:  75 year old gentleman with the following issues:  1. Prostate cancer diagnosed in February 2015. He presented with a PSA of 6.8 and status post robotic prostatectomy. He is currently under active surveillance with a PSA continues to be close to 0 in March 2017.  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 He does not report any major symptoms at this time and continues to be clinically stable. Laboratory data are not changed. The plan is continued active surveillance unless he develops indication for treatment. These were discussed today in detail and certainly he does not have any of them.  3. Follow-up: Will be in 6 months sooner if needed to.    [facility], MD 5/9/20179:04 AM

## 2016-04-09 NOTE — Telephone Encounter (Signed)
pt called to r/s appt in nov...done....pt ok adn aware of new d.t

## 2016-04-11 ENCOUNTER — Encounter (INDEPENDENT_AMBULATORY_CARE_PROVIDER_SITE_OTHER): Payer: Medicare Other | Admitting: Ophthalmology

## 2016-04-11 DIAGNOSIS — H353231 Exudative age-related macular degeneration, bilateral, with active choroidal neovascularization: Secondary | ICD-10-CM

## 2016-04-11 DIAGNOSIS — E113213 Type 2 diabetes mellitus with mild nonproliferative diabetic retinopathy with macular edema, bilateral: Secondary | ICD-10-CM | POA: Diagnosis not present

## 2016-04-11 DIAGNOSIS — H35033 Hypertensive retinopathy, bilateral: Secondary | ICD-10-CM

## 2016-04-11 DIAGNOSIS — E11311 Type 2 diabetes mellitus with unspecified diabetic retinopathy with macular edema: Secondary | ICD-10-CM | POA: Diagnosis not present

## 2016-04-11 DIAGNOSIS — H43813 Vitreous degeneration, bilateral: Secondary | ICD-10-CM | POA: Diagnosis not present

## 2016-04-11 DIAGNOSIS — I10 Essential (primary) hypertension: Secondary | ICD-10-CM

## 2016-04-12 ENCOUNTER — Ambulatory Visit (INDEPENDENT_AMBULATORY_CARE_PROVIDER_SITE_OTHER): Payer: Medicare Other | Admitting: Family Medicine

## 2016-04-12 ENCOUNTER — Encounter: Payer: Self-pay | Admitting: Family Medicine

## 2016-04-12 VITALS — BP 126/80 | HR 90 | Temp 98.1°F | Wt 266.0 lb

## 2016-04-12 DIAGNOSIS — Z794 Long term (current) use of insulin: Secondary | ICD-10-CM | POA: Diagnosis not present

## 2016-04-12 DIAGNOSIS — I10 Essential (primary) hypertension: Secondary | ICD-10-CM

## 2016-04-12 DIAGNOSIS — E119 Type 2 diabetes mellitus without complications: Secondary | ICD-10-CM | POA: Diagnosis not present

## 2016-04-12 DIAGNOSIS — E785 Hyperlipidemia, unspecified: Secondary | ICD-10-CM

## 2016-04-12 NOTE — Assessment & Plan Note (Signed)
S: controlled on amlodidpine 10mg  and losartan 100mg  and hctz 25mg - took chlorthalidone for only 2 days then stopped because thought he was peeing less. Home readings now into 110s and 140s instead of 140s to 150s as previous BP Readings from Last 3 Encounters:  04/12/16 126/80  04/09/16 119/87  03/20/16 140/70  A/P:Continue current meds:  Doing much better despite not adjusting medication Home cuff 142/85 compared to 134/72 on my check. Likely home cuff about 10 points higher.

## 2016-04-12 NOTE — Assessment & Plan Note (Signed)
S: suspect improved controlled [facility]- unsure dose but taking 1/2 pill. No myalgias as he has had in past. Never took once weekly atorvastatin i had prescribed Lab Results  Component Value Date   CHOL 170 05/08/2015   HDL 47 05/08/2015   LDLCALC 95 05/08/2015   LDLDIRECT 90.6 11/16/2013   TRIG 495* 05/08/2015   CHOLHDL 4 11/16/2013   A/P: update lipids at follow up- asked family to let me know dose of simvastatin so we could update chart

## 2016-04-12 NOTE — Progress Notes (Signed)
Subjective:  [name] is a 75 y.o. year old very pleasant male patient who presents for/with See problem oriented charting ROS- hungry and anxious and sweaty when sugar gets low. No chest pain or shortness of breath. No headache or blurry vision. .see any ROS included in HPI as well.   Past Medical History-  Patient Active Problem List   Diagnosis Date Noted  . Chronic low back pain 12/12/2015    Priority: High  . Chronic lymphocytic leukemia ([place]) 05/16/2014    Priority: High  . Prostate cancer ([place]) 01/24/2014    Priority: High  . Diabetes mellitus type 2, controlled ([place]) 09/24/2007    Priority: High  . Hypothyroidism     Priority: Medium  . 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 Depression 09/21/2009    Priority: Medium  . Diabetic retinopathy ([place]) 09/24/2007    Priority: Medium  . Hyperlipidemia 09/24/2007    Priority: Medium  . OSA on CPAP 09/24/2007    Priority: Medium  . Essential hypertension 09/24/2007    Priority: Medium  . Essential tremor 08/11/2015    Priority: Low  . GERD (gastroesophageal reflux disease) 12/08/2014    Priority: Low  . Obesity 10/25/2008    Priority: Low  . History of colonic polyps 08/22/2008    Priority: Low    Medications- reviewed and updated Current Outpatient Prescriptions  Medication Sig Dispense Refill  . amLODipine (NORVASC) 10 MG tablet Take 10 mg by mouth every morning.    . hydrochlorothiazide (HYDRODIURIL) 25 MG tablet Take 25 mg by mouth daily.    . insulin aspart (NOVOLOG) 100 UNIT/ML injection Inject 40 Units into the skin 3 (three) times daily with meals. When sugar is elevated    . insulin glargine (LANTUS) 100 UNIT/ML injection Inject 50 Units into the skin at bedtime.     . lansoprazole (PREVACID) 30 MG capsule Take 30 mg by mouth daily at 12 noon.    Marland Kitchen levothyroxine (SYNTHROID, LEVOTHROID) 50 MCG tablet Take 50 mcg by mouth daily before breakfast.     . losartan (COZAAR) 100 MG tablet Take  100 mg by mouth daily.    . mirtazapine (REMERON) 15 MG tablet Take 15 mg by mouth at bedtime.    . ONE TOUCH ULTRA TEST test strip Test 4 times daily. Dx: E11.9 100 each 11  . potassium chloride SA (K-DUR,KLOR-CON) 20 MEQ tablet Take 20 mEq by mouth daily.    . simvastatin (ZOCOR) 40 MG tablet Take 40 mg by mouth daily. [facility]- dose unclear but taking 1/2 tablet    . sucralfate (CARAFATE) 1 G tablet Take 1 g by mouth 4 (four) times daily -  with meals and at bedtime.     No current facility-administered medications for this visit.    Objective: BP 126/80 mmHg  Pulse 90  Temp(Src) 98.1 F (36.7 C)  Wt 266 lb (120.657 kg) Gen: NAD, resting comfortably CV: RRR no murmurs rubs or gallops Lungs: CTAB no crackles, wheeze, rhonchi Abdomen: soft/nontender/nondistended/normal bowel sounds. No rebound or guarding.  Ext: no edema Skin: warm, dry Neuro: walks hunched over and states pain with back (has upcoming ortho or neurosurgery appt)  Assessment/Plan:  Diabetes mellitus type 2, controlled (HCC) S:lantus 100, novolog 40-60 units with meals. Self adjusted to 60. Advised stick to 40 last visit but still has taken some doses of 60 and almost always sugars go into 60s with this.  A/P: Advised patient stick to  40 units novolog and log sugars and follow up in 2 months- sooner if low sugars recur despite lower dose   Hyperlipidemia S: suspect improved controlled on [facility]- unsure dose but taking 1/2 pill. No myalgias as he has had in past. Never took once weekly atorvastatin i had prescribed Lab Results  Component Value Date   CHOL 170 05/08/2015   HDL 47 05/08/2015   LDLCALC 95 05/08/2015   LDLDIRECT 90.6 11/16/2013   TRIG 495* 05/08/2015   CHOLHDL 4 11/16/2013   A/P: update lipids at follow up- asked family to let me know dose of simvastatin so we could update chart    Essential hypertension S: controlled on amlodidpine 10mg  and losartan 100mg  and hctz 25mg -  took chlorthalidone for only 2 days then stopped because thought he was peeing less. Home readings now into 110s and 140s instead of 140s to 150s as previous BP Readings from Last 3 Encounters:  04/12/16 126/80  04/09/16 119/87  03/20/16 140/70  A/P:Continue current meds:  Doing much better despite not adjusting medication Home cuff 142/85 compared to 134/72 on my check. Likely home cuff about 10 points higher.    2 months. Return precautions advised.   Meds ordered this encounter  Medications  . simvastatin (ZOCOR) 40 MG tablet    Sig: Take 40 mg by mouth daily. [facility]- dose unclear but taking 1/2 tablet  . hydrochlorothiazide (HYDRODIURIL) 25 MG tablet    Sig: Take 25 mg by mouth daily.    [name], MD

## 2016-04-12 NOTE — Assessment & Plan Note (Signed)
S:lantus 100, novolog 40-60 units with meals. Self adjusted to 60. Advised stick to 40 last visit but still has taken some doses of 60 and almost always sugars go into 60s with this.  A/P: Advised patient stick to 40 units novolog and log sugars and follow up in 2 months- sooner if low sugars recur despite lower dose

## 2016-04-12 NOTE — Patient Instructions (Signed)
Thrilled you are able to tolerate cholesterol medicine 1/2 tablet- let me know by mychart the dosage of the simvastatin   Please avoid taking the full 60 units of insulin because you keep getting low numbers when this happens- stick with the 40 units- I would rather run a little high than low. Ideally we keep all your sugars under 200. Please record all your sugars and how much insulin you take.   Blood pressure looks better despite being on same medicine as last time. Home cuff runs about 10 points higher so lets try to keep home readings always under 150- see me back if not

## 2016-04-15 ENCOUNTER — Encounter (INDEPENDENT_AMBULATORY_CARE_PROVIDER_SITE_OTHER): Payer: Medicare Other | Admitting: Ophthalmology

## 2016-04-15 DIAGNOSIS — E119 Type 2 diabetes mellitus without complications: Secondary | ICD-10-CM | POA: Diagnosis not present

## 2016-04-15 DIAGNOSIS — M47816 Spondylosis without myelopathy or radiculopathy, lumbar region: Secondary | ICD-10-CM | POA: Diagnosis not present

## 2016-04-15 DIAGNOSIS — G894 Chronic pain syndrome: Secondary | ICD-10-CM | POA: Diagnosis not present

## 2016-04-15 DIAGNOSIS — G4733 Obstructive sleep apnea (adult) (pediatric): Secondary | ICD-10-CM | POA: Diagnosis not present

## 2016-04-15 DIAGNOSIS — M1990 Unspecified osteoarthritis, unspecified site: Secondary | ICD-10-CM | POA: Diagnosis not present

## 2016-04-15 DIAGNOSIS — I10 Essential (primary) hypertension: Secondary | ICD-10-CM | POA: Diagnosis not present

## 2016-04-15 DIAGNOSIS — Z885 Allergy status to narcotic agent status: Secondary | ICD-10-CM | POA: Diagnosis not present

## 2016-04-15 DIAGNOSIS — M47817 Spondylosis without myelopathy or radiculopathy, lumbosacral region: Secondary | ICD-10-CM | POA: Diagnosis not present

## 2016-04-15 DIAGNOSIS — Z87891 Personal history of nicotine dependence: Secondary | ICD-10-CM | POA: Diagnosis not present

## 2016-04-18 ENCOUNTER — Encounter (INDEPENDENT_AMBULATORY_CARE_PROVIDER_SITE_OTHER): Payer: Medicare Other | Admitting: Ophthalmology

## 2016-04-25 DIAGNOSIS — M545 Low back pain: Secondary | ICD-10-CM | POA: Diagnosis not present

## 2016-04-25 DIAGNOSIS — M47816 Spondylosis without myelopathy or radiculopathy, lumbar region: Secondary | ICD-10-CM | POA: Diagnosis not present

## 2016-04-25 DIAGNOSIS — Z5181 Encounter for therapeutic drug level monitoring: Secondary | ICD-10-CM | POA: Diagnosis not present

## 2016-04-25 DIAGNOSIS — G894 Chronic pain syndrome: Secondary | ICD-10-CM | POA: Diagnosis not present

## 2016-04-25 DIAGNOSIS — Z79899 Other long term (current) drug therapy: Secondary | ICD-10-CM | POA: Diagnosis not present

## 2016-04-25 DIAGNOSIS — G8929 Other chronic pain: Secondary | ICD-10-CM | POA: Diagnosis not present

## 2016-04-30 DIAGNOSIS — S20211A Contusion of right front wall of thorax, initial encounter: Secondary | ICD-10-CM | POA: Diagnosis not present

## 2016-05-06 DIAGNOSIS — M1990 Unspecified osteoarthritis, unspecified site: Secondary | ICD-10-CM | POA: Diagnosis not present

## 2016-05-06 DIAGNOSIS — I10 Essential (primary) hypertension: Secondary | ICD-10-CM | POA: Diagnosis not present

## 2016-05-06 DIAGNOSIS — E119 Type 2 diabetes mellitus without complications: Secondary | ICD-10-CM | POA: Diagnosis not present

## 2016-05-06 DIAGNOSIS — G894 Chronic pain syndrome: Secondary | ICD-10-CM | POA: Diagnosis not present

## 2016-05-06 DIAGNOSIS — G4733 Obstructive sleep apnea (adult) (pediatric): Secondary | ICD-10-CM | POA: Diagnosis not present

## 2016-05-06 DIAGNOSIS — Z885 Allergy status to narcotic agent status: Secondary | ICD-10-CM | POA: Diagnosis not present

## 2016-05-06 DIAGNOSIS — Z794 Long term (current) use of insulin: Secondary | ICD-10-CM | POA: Diagnosis not present

## 2016-05-06 DIAGNOSIS — M47817 Spondylosis without myelopathy or radiculopathy, lumbosacral region: Secondary | ICD-10-CM | POA: Diagnosis not present

## 2016-05-07 DIAGNOSIS — Z0000 Encounter for general adult medical examination without abnormal findings: Secondary | ICD-10-CM | POA: Diagnosis not present

## 2016-05-15 ENCOUNTER — Encounter: Payer: Self-pay | Admitting: Family Medicine

## 2016-05-16 ENCOUNTER — Encounter: Payer: Self-pay | Admitting: Family Medicine

## 2016-05-16 ENCOUNTER — Ambulatory Visit (INDEPENDENT_AMBULATORY_CARE_PROVIDER_SITE_OTHER): Payer: Medicare Other | Admitting: Family Medicine

## 2016-05-16 VITALS — BP 122/80 | HR 106 | Temp 98.9°F | Ht 72.0 in | Wt 260.0 lb

## 2016-05-16 DIAGNOSIS — R0781 Pleurodynia: Secondary | ICD-10-CM | POA: Diagnosis not present

## 2016-05-16 NOTE — Progress Notes (Signed)
Subjective:  [name] is a 75 y.o. year old very pleasant male patient who presents for/with See problem oriented charting ROS- no chest pain (other than rib wall pain), no shortness of breath or difficulty breathing. No abdominal pain.see any ROS included in HPI as well.   Past Medical History-  Patient Active Problem List   Diagnosis Date Noted  . Chronic low back pain 12/12/2015    Priority: High  . Chronic lymphocytic leukemia ([place]) 05/16/2014    Priority: High  . Prostate cancer ([place]) 01/24/2014    Priority: High  . Diabetes mellitus type 2, controlled ([place]) 09/24/2007    Priority: High  . Hypothyroidism     Priority: Medium  . 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 Depression 09/21/2009    Priority: Medium  . Diabetic retinopathy ([place]) 09/24/2007    Priority: Medium  . Hyperlipidemia 09/24/2007    Priority: Medium  . OSA on CPAP 09/24/2007    Priority: Medium  . Essential hypertension 09/24/2007    Priority: Medium  . Essential tremor 08/11/2015    Priority: Low  . GERD (gastroesophageal reflux disease) 12/08/2014    Priority: Low  . Obesity 10/25/2008    Priority: Low  . History of colonic polyps 08/22/2008    Priority: Low    Medications- reviewed and updated Current Outpatient Prescriptions  Medication Sig Dispense Refill  . amLODipine (NORVASC) 10 MG tablet Take 10 mg by mouth every morning.    . hydrochlorothiazide (HYDRODIURIL) 25 MG tablet Take 25 mg by mouth daily.    . insulin aspart (NOVOLOG) 100 UNIT/ML injection Inject 40 Units into the skin 3 (three) times daily with meals. When sugar is elevated    . insulin glargine (LANTUS) 100 UNIT/ML injection Inject 50 Units into the skin at bedtime.     . lansoprazole (PREVACID) 30 MG capsule Take 30 mg by mouth daily at 12 noon.    Marland Kitchen levothyroxine (SYNTHROID, LEVOTHROID) 50 MCG tablet Take 50 mcg by mouth daily before breakfast.     . losartan (COZAAR) 100 MG tablet Take 100 mg by  mouth daily.    . mirtazapine (REMERON) 15 MG tablet Take 15 mg by mouth at bedtime.    . ONE TOUCH ULTRA TEST test strip Test 4 times daily. Dx: E11.9 100 each 11  . potassium chloride SA (K-DUR,KLOR-CON) 20 MEQ tablet Take 20 mEq by mouth daily.    . simvastatin (ZOCOR) 40 MG tablet Take 40 mg by mouth daily. [facility]- dose unclear but taking 1/2 tablet    . sucralfate (CARAFATE) 1 G tablet Take 1 g by mouth 4 (four) times daily -  with meals and at bedtime.     No current facility-administered medications for this visit.    Objective: BP 122/80 mmHg  Pulse 106  Temp(Src) 98.9 F (37.2 C) (Oral)  Ht 6' (1.829 m)  Wt 260 lb (117.935 kg)  BMI 35.25 kg/m2  SpO2 97% Gen: NAD, resting comfortably CV: RRR no murmurs rubs or gallops MSK: right sided rib pain largely along mid axillary line. Tenderness not more than mild to moderate.  Lungs: CTAB no crackles, wheeze, rhonchi Abdomen: soft/nontender/nondistended/normal bowel sounds.   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 Put rail up afterwards. Occurred  3.5 weeks ago. Had achy pain  throughout right side- hard to breath or cough but now able without difficulty. Now with a burning hurt pain on right side but radiates to the back. Laid for 4 hours before he got up. Went to urgent care- advised x-ray and he declined due to prior radiation.   Taking tramadol 2 in Am and 2 at night from pain management- 2 in AM wearing off before takes 2 at night A/P: potential rib fracture vs. Bone bruise. We discussed x-rays but patient declines given improving pain pattern. Return precautions advised- counseling needed for wife/patient who are in some distress- fortunately have guard up so he cannot fall at night  Also they inquired about wart care for small wart on left foot. We discussed cryo but  could blister and diabetic. Likely best salicylic acid and duct tape- aafp handout given on this.   The duration of face-to-face time during this visit was 15 minutes. Greater than 50% of this time was spent in counseling, explanation of diagnosis, planning of further management, and/or coordination of care.   Return precautions advised.  [name], MD

## 2016-05-16 NOTE — Progress Notes (Signed)
Pre visit review using our clinic review tool, if applicable. No additional management support is needed unless otherwise documented below in the visit note. 

## 2016-05-16 NOTE — Patient Instructions (Signed)
No changes today  If shortness of breath or fever seek care- this is likely a rib fracture. Will take likely 6 weeks to heal at least.   Can use handout for wart

## 2016-05-27 DIAGNOSIS — G8929 Other chronic pain: Secondary | ICD-10-CM | POA: Diagnosis not present

## 2016-05-27 DIAGNOSIS — M47816 Spondylosis without myelopathy or radiculopathy, lumbar region: Secondary | ICD-10-CM | POA: Diagnosis not present

## 2016-05-27 DIAGNOSIS — G894 Chronic pain syndrome: Secondary | ICD-10-CM | POA: Diagnosis not present

## 2016-05-27 DIAGNOSIS — M545 Low back pain: Secondary | ICD-10-CM | POA: Diagnosis not present

## 2016-06-13 ENCOUNTER — Encounter (INDEPENDENT_AMBULATORY_CARE_PROVIDER_SITE_OTHER): Payer: Medicare Other | Admitting: Ophthalmology

## 2016-06-14 ENCOUNTER — Encounter (INDEPENDENT_AMBULATORY_CARE_PROVIDER_SITE_OTHER): Payer: Medicare Other | Admitting: Ophthalmology

## 2016-06-14 DIAGNOSIS — E113213 Type 2 diabetes mellitus with mild nonproliferative diabetic retinopathy with macular edema, bilateral: Secondary | ICD-10-CM | POA: Diagnosis not present

## 2016-06-14 DIAGNOSIS — I10 Essential (primary) hypertension: Secondary | ICD-10-CM

## 2016-06-14 DIAGNOSIS — H43813 Vitreous degeneration, bilateral: Secondary | ICD-10-CM | POA: Diagnosis not present

## 2016-06-14 DIAGNOSIS — E11311 Type 2 diabetes mellitus with unspecified diabetic retinopathy with macular edema: Secondary | ICD-10-CM | POA: Diagnosis not present

## 2016-06-14 DIAGNOSIS — H35033 Hypertensive retinopathy, bilateral: Secondary | ICD-10-CM | POA: Diagnosis not present

## 2016-06-14 DIAGNOSIS — H353231 Exudative age-related macular degeneration, bilateral, with active choroidal neovascularization: Secondary | ICD-10-CM | POA: Diagnosis not present

## 2016-06-18 ENCOUNTER — Ambulatory Visit (INDEPENDENT_AMBULATORY_CARE_PROVIDER_SITE_OTHER): Payer: Medicare Other | Admitting: Family Medicine

## 2016-06-18 ENCOUNTER — Encounter: Payer: Self-pay | Admitting: Family Medicine

## 2016-06-18 VITALS — BP 128/78 | HR 77 | Temp 98.3°F | Ht 72.0 in | Wt 265.0 lb

## 2016-06-18 DIAGNOSIS — I10 Essential (primary) hypertension: Secondary | ICD-10-CM | POA: Diagnosis not present

## 2016-06-18 DIAGNOSIS — E785 Hyperlipidemia, unspecified: Secondary | ICD-10-CM | POA: Diagnosis not present

## 2016-06-18 DIAGNOSIS — Z794 Long term (current) use of insulin: Secondary | ICD-10-CM

## 2016-06-18 DIAGNOSIS — E119 Type 2 diabetes mellitus without complications: Secondary | ICD-10-CM

## 2016-06-18 LAB — POCT GLYCOSYLATED HEMOGLOBIN (HGB A1C): Hemoglobin A1C: 6.7

## 2016-06-18 NOTE — Assessment & Plan Note (Signed)
S: lantus 100 units, novolog 40-60 units with meals. Hypoglycemia previously when taking 60 so advised to stick to 40 with meals. CBG-s mornings 123-286. 145 this morning Before lunch- 100s to 200s After lunch-as low as 70 usually 100s but as high as 200s Before dinner- 99 to mainly 100s but some in 200s Lab Results  Component Value Date   HGBA1C 6.5 02/27/2016  A/P:  A1c came back at 6.7. Sugars seem higher than this on paper. Let's adjust your Lantus to 102 units. Continue 40 units with each meal- if you feel you are going to have a heavy meal or if sugar is over 250 you can take 45 units. See me back in 3 months and a day

## 2016-06-18 NOTE — Patient Instructions (Addendum)
A1c came back at 6.7. Sugars seem higher than this on paper. Let's adjust your Lantus to 102 units. Continue 40 units with each meal- if you feel you are going to have a heavy meal or if sugar is over 250 you can take 45 units. See me back in 3 months and a day  No other changes in medications  [facility]- if they do not do labs between now and last visit- may just do them here.

## 2016-06-18 NOTE — Progress Notes (Signed)
Subjective:  [name] is a 75 y.o. year old very pleasant male patient who presents for/with See problem oriented charting ROS- continued low back pain and neuropathic symptoms- no change in urinary or fecal measures. No chest pain. No headache or blurry vision. .see any ROS included in HPI as well.   Past Medical History-  Patient Active Problem List   Diagnosis Date Noted  . Chronic low back pain 12/12/2015    Priority: High  . Chronic lymphocytic leukemia ([place]) 05/16/2014    Priority: High  . Prostate cancer ([place]) 01/24/2014    Priority: High  . Diabetes mellitus type 2, controlled ([place]) 09/24/2007    Priority: High  . Hypothyroidism     Priority: Medium  . 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 Depression 09/21/2009    Priority: Medium  . Diabetic retinopathy ([place]) 09/24/2007    Priority: Medium  . Hyperlipidemia 09/24/2007    Priority: Medium  . OSA on CPAP 09/24/2007    Priority: Medium  . Essential hypertension 09/24/2007    Priority: Medium  . Essential tremor 08/11/2015    Priority: Low  . GERD (gastroesophageal reflux disease) 12/08/2014    Priority: Low  . Obesity 10/25/2008    Priority: Low  . History of colonic polyps 08/22/2008    Priority: Low    Medications- reviewed and updated Current Outpatient Prescriptions  Medication Sig Dispense Refill  . amLODipine (NORVASC) 10 MG tablet Take 10 mg by mouth every morning.    . hydrochlorothiazide (HYDRODIURIL) 25 MG tablet Take 25 mg by mouth daily.    . insulin aspart (NOVOLOG) 100 UNIT/ML injection Inject 40 Units into the skin 3 (three) times daily with meals. When sugar is elevated    . insulin glargine (LANTUS) 100 UNIT/ML injection Inject 50 Units into the skin at bedtime.     . lansoprazole (PREVACID) 30 MG capsule Take 30 mg by mouth daily at 12 noon.    Marland Kitchen levothyroxine (SYNTHROID, LEVOTHROID) 50 MCG tablet Take 50 mcg by mouth daily before breakfast.     . losartan (COZAAR) 100  MG tablet Take 100 mg by mouth daily.    . mirtazapine (REMERON) 15 MG tablet Take 15 mg by mouth at bedtime.    . ONE TOUCH ULTRA TEST test strip Test 4 times daily. Dx: E11.9 100 each 11  . potassium chloride SA (K-DUR,KLOR-CON) 20 MEQ tablet Take 20 mEq by mouth daily.    . simvastatin (ZOCOR) 40 MG tablet Take 40 mg by mouth daily. [facility]- dose [facility] but taking 1/2 tablet    . sucralfate (CARAFATE) 1 G tablet Take 1 g by mouth 4 (four) times daily -  with meals and at bedtime.    . traMADol (ULTRAM) 50 MG tablet Take by mouth.     No current facility-administered medications for this visit.    Objective: BP 128/78 mmHg  Pulse 77  Temp(Src) 98.3 F (36.8 C) (Oral)  Ht 6' (1.829 m)  Wt 265 lb (120.203 kg)  BMI 35.93 kg/m2  SpO2 98% Gen: NAD, resting comfortably CV: RRR no murmurs rubs or gallops Lungs: CTAB no crackles, wheeze, rhonchi Abdomen: soft/nontender/nondistended/normal bowel sounds. obese  Ext: trace edema Skin: warm, dry, no rash Neuro: grossly normal, moves all extremities, slightly antalgic gaint  Diabetic Foot Exam - Simple   Simple Foot Form  Diabetic Foot exam was performed with the following findings:  Yes 06/18/2016  9:40 AM  Visual Inspection  No deformities, no ulcerations, no other skin breakdown bilaterally:  Yes  Sensation Testing  Intact to touch and monofilament testing bilaterally:  Yes  Pulse Check  Posterior Tibialis and Dorsalis pulse intact bilaterally:  Yes  Comments    2 small warts on foot   Assessment/Plan:   Diabetes mellitus type 2, controlled ([facility]) S: lantus 100 units, novolog 40-60 units with meals. Hypoglycemia previously when taking 60 so advised to stick to 40 with meals. CBG-s mornings 123-286. 145 this morning Before lunch- 100s to 200s After lunch-as low as 70 usually 100s but as high as 200s Before dinner- 99 to mainly 100s but some in 200s Lab Results  Component Value Date   HGBA1C 6.5 02/27/2016  A/P:  A1c  came back at 6.7. Sugars seem higher than this on paper. Let's adjust your Lantus to 102 units. Continue 40 units with each meal- if you feel you are going to have a heavy meal or if sugar is over 250 you can take 45 units. See me back in 3 months and a day  Hyperlipidemia  S: previously on simvastatin through [place]- dose unknown- possibly 20mg . No myalgias ashad in past A/P: [facility]   Hypertension S: controlled. On amlodipine 10mg  and losartan 100mg  and hctz 25mg . Home readings- has not been as diligient on checking. Had previously tried chlorthalidone but stated he was peeing less  BP Readings from Last 3 Encounters:  06/18/16 128/78  05/16/16 122/80  04/12/16 126/80  A/P:Continue current meds:  Doing well  Trying to do less tramadol and pain reasonably controlled  3 months  Orders Placed This Encounter  Procedures  . POC HgB A1c    Meds ordered this encounter  Medications  . traMADol (ULTRAM) 50 MG tablet    Sig: Take by mouth.    Return precautions advised.  [name], MD

## 2016-06-18 NOTE — Progress Notes (Signed)
Pre visit review using our clinic review tool, if applicable. No additional management support is needed unless otherwise documented below in the visit note. 

## 2016-06-18 NOTE — Assessment & Plan Note (Signed)
  S: previously on simvastatin through [place]- dose unknown- possibly 20mg . No myalgias ashad in past A/P: get [facility]

## 2016-06-24 ENCOUNTER — Telehealth: Payer: Self-pay | Admitting: *Deleted

## 2016-06-24 NOTE — Telephone Encounter (Signed)
"  Dr. Hazeline Junker scheduled more appointments for him.  Please call us at home.  We won't be in town for these appointments."

## 2016-06-27 ENCOUNTER — Telehealth: Payer: Self-pay | Admitting: Oncology

## 2016-06-27 NOTE — Telephone Encounter (Signed)
spoke w/ pt wife.. they will be here for 11/28 apts

## 2016-07-03 ENCOUNTER — Encounter: Payer: Self-pay | Admitting: Family Medicine

## 2016-07-04 NOTE — Telephone Encounter (Signed)
Wife states pt needs a prescription stating pt needs the power lift /ramp installed on pt's truck and and the ramp on wife's car in order to haul pt's scooter, wheelchair or walker.  Pt needs in order to be transported due to his medical condition.  Wife states she needs by Monday morning, 8/7.  Please advise.  [id number]

## 2016-07-05 ENCOUNTER — Encounter: Payer: Self-pay | Admitting: Family Medicine

## 2016-07-09 DIAGNOSIS — M47816 Spondylosis without myelopathy or radiculopathy, lumbar region: Secondary | ICD-10-CM | POA: Diagnosis not present

## 2016-07-09 DIAGNOSIS — M545 Low back pain: Secondary | ICD-10-CM | POA: Diagnosis not present

## 2016-07-09 DIAGNOSIS — M1288 Other specific arthropathies, not elsewhere classified, other specified site: Secondary | ICD-10-CM | POA: Diagnosis not present

## 2016-07-09 DIAGNOSIS — G894 Chronic pain syndrome: Secondary | ICD-10-CM | POA: Diagnosis not present

## 2016-08-08 IMAGING — NM NM MYOCAR MULTI W/ SPECT
3 series · 18 of 18 positions shown · non-contrast
Comparison: none

[Series 1: rest · 6.51mm/px · 6 of 64 frames shown]
[frame 6/64]
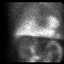
[frame 16/64]
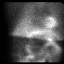
[frame 27/64]
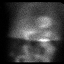
[frame 38/64]
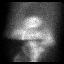
[frame 48/64]
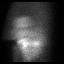
[frame 59/64]
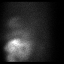

[Series 2: stress · 6.51mm/px · 6 of 512 frames shown (1 of 2)]
[frame 43/512]
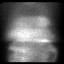
[frame 128/512]
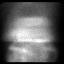
[frame 214/512]
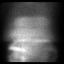
[frame 299/512]
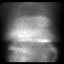
[frame 384/512]
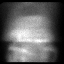
[frame 470/512]
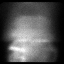

[Series 2: stress · 6.51mm/px · 6 of 64 frames shown (2 of 2)]
[frame 6/64]
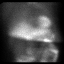
[frame 16/64]
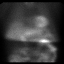
[frame 27/64]
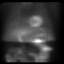
[frame 38/64]
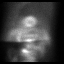
[frame 48/64]
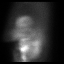
[frame 59/64]
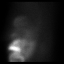

[18 of 18 positions shown; findings below may reference images not displayed]

Canned report from images found in remote index.

Refer to host system for actual result text.

## 2016-08-12 DIAGNOSIS — Z8546 Personal history of malignant neoplasm of prostate: Secondary | ICD-10-CM | POA: Diagnosis not present

## 2016-08-15 ENCOUNTER — Encounter (INDEPENDENT_AMBULATORY_CARE_PROVIDER_SITE_OTHER): Payer: Medicare Other | Admitting: Ophthalmology

## 2016-08-15 DIAGNOSIS — H35033 Hypertensive retinopathy, bilateral: Secondary | ICD-10-CM

## 2016-08-15 DIAGNOSIS — H353231 Exudative age-related macular degeneration, bilateral, with active choroidal neovascularization: Secondary | ICD-10-CM

## 2016-08-15 DIAGNOSIS — H43813 Vitreous degeneration, bilateral: Secondary | ICD-10-CM

## 2016-08-15 DIAGNOSIS — E113213 Type 2 diabetes mellitus with mild nonproliferative diabetic retinopathy with macular edema, bilateral: Secondary | ICD-10-CM

## 2016-08-15 DIAGNOSIS — E11311 Type 2 diabetes mellitus with unspecified diabetic retinopathy with macular edema: Secondary | ICD-10-CM | POA: Diagnosis not present

## 2016-08-15 DIAGNOSIS — I10 Essential (primary) hypertension: Secondary | ICD-10-CM

## 2016-08-19 DIAGNOSIS — N393 Stress incontinence (female) (male): Secondary | ICD-10-CM | POA: Diagnosis not present

## 2016-08-19 DIAGNOSIS — N5231 Erectile dysfunction following radical prostatectomy: Secondary | ICD-10-CM | POA: Diagnosis not present

## 2016-08-19 DIAGNOSIS — C9110 Chronic lymphocytic leukemia of B-cell type not having achieved remission: Secondary | ICD-10-CM | POA: Diagnosis not present

## 2016-08-19 DIAGNOSIS — Z8546 Personal history of malignant neoplasm of prostate: Secondary | ICD-10-CM | POA: Diagnosis not present

## 2016-08-28 ENCOUNTER — Encounter: Payer: Self-pay | Admitting: *Deleted

## 2016-08-28 LAB — BASIC METABOLIC PANEL
Creatinine: 1 mg/dL (ref 0.6–1.3)
Glucose: 223 mg/dL
Potassium: 3.6 mmol/L (ref 3.4–5.3)
Sodium: 140 mmol/L (ref 137–147)

## 2016-08-28 LAB — CBC AND DIFFERENTIAL
HCT: 44 % (ref 41–53)
Hemoglobin: 15.3 g/dL (ref 13.5–17.5)
Neutrophils Absolute: 46 /uL
Platelets: 248 10*3/uL (ref 150–399)
WBC: 10.9 10^3/mL

## 2016-08-28 LAB — PSA: PSA: 0.01

## 2016-08-28 LAB — HEMOGLOBIN A1C: Hemoglobin A1C: 7.5

## 2016-08-28 NOTE — Progress Notes (Signed)
Faxed last office visit to [facility] [place]

## 2016-08-29 DIAGNOSIS — E113212 Type 2 diabetes mellitus with mild nonproliferative diabetic retinopathy with macular edema, left eye: Secondary | ICD-10-CM | POA: Diagnosis not present

## 2016-08-29 DIAGNOSIS — E113211 Type 2 diabetes mellitus with mild nonproliferative diabetic retinopathy with macular edema, right eye: Secondary | ICD-10-CM | POA: Diagnosis not present

## 2016-08-29 DIAGNOSIS — H35032 Hypertensive retinopathy, left eye: Secondary | ICD-10-CM | POA: Diagnosis not present

## 2016-08-29 DIAGNOSIS — E119 Type 2 diabetes mellitus without complications: Secondary | ICD-10-CM | POA: Diagnosis not present

## 2016-08-29 DIAGNOSIS — H35031 Hypertensive retinopathy, right eye: Secondary | ICD-10-CM | POA: Diagnosis not present

## 2016-08-29 DIAGNOSIS — H43813 Vitreous degeneration, bilateral: Secondary | ICD-10-CM | POA: Diagnosis not present

## 2016-09-03 ENCOUNTER — Encounter: Payer: Self-pay | Admitting: Family Medicine

## 2016-09-19 ENCOUNTER — Encounter: Payer: Self-pay | Admitting: Family Medicine

## 2016-09-19 ENCOUNTER — Ambulatory Visit (INDEPENDENT_AMBULATORY_CARE_PROVIDER_SITE_OTHER): Payer: Medicare Other | Admitting: Family Medicine

## 2016-09-19 VITALS — BP 130/70 | HR 93 | Temp 98.1°F | Wt 266.2 lb

## 2016-09-19 DIAGNOSIS — I10 Essential (primary) hypertension: Secondary | ICD-10-CM

## 2016-09-19 DIAGNOSIS — E119 Type 2 diabetes mellitus without complications: Secondary | ICD-10-CM

## 2016-09-19 DIAGNOSIS — E785 Hyperlipidemia, unspecified: Secondary | ICD-10-CM

## 2016-09-19 DIAGNOSIS — E113299 Type 2 diabetes mellitus with mild nonproliferative diabetic retinopathy without macular edema, unspecified eye: Secondary | ICD-10-CM

## 2016-09-19 DIAGNOSIS — Z794 Long term (current) use of insulin: Secondary | ICD-10-CM | POA: Diagnosis not present

## 2016-09-19 NOTE — Assessment & Plan Note (Signed)
S: poorly controlled. On lantus 100units, novolog 40-60 units with meals. We had cut down to 40 regularly to help with hypoglycemia with 45 units before heavy meals and titration to 102 units lantus last itme. Not clear why additional a1c was done.  CBGs-  Running high Mornings 166 this morning pretty typical Before lunch not checking After lucnch not checking Before dinner not checking Before bed- usually in 300s.  Only 1 low blood sugar since last visit and that was 71 so overall better.   Exercise and diet- poor Lab Results  Component Value Date   HGBA1C 7.5 08/28/2016   HGBA1C 6.7 06/18/2016   HGBA1C 6.5 02/27/2016   A/P: a1c and sugars trending up but no hypoglycemia. Titrate to 42 units novolog before meals and then continue 102 units lantus. Follow up by mychart 1 month. Going on cruise and told him if regularly running high can do 45 with each meal

## 2016-09-19 NOTE — Progress Notes (Signed)
Subjective:  [name] is a 75 y.o. year old very pleasant male patient who presents for/with See problem oriented charting ROS- no hypoglycemia under 70. No chest pain or shortness of breath. No headache or blurry vision. .see any ROS included in HPI as well.   Past Medical History-  Patient Active Problem List   Diagnosis Date Noted  . Chronic low back pain 12/12/2015    Priority: High  . Chronic lymphocytic leukemia ([place]) 05/16/2014    Priority: High  . History of prostate cancer 01/24/2014    Priority: High  . Diabetes mellitus type 2, controlled ([place]) 09/24/2007    Priority: High  . Hypothyroidism     Priority: Medium  . 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 Depression 09/21/2009    Priority: Medium  . Diabetic retinopathy ([place]) 09/24/2007    Priority: Medium  . Hyperlipidemia 09/24/2007    Priority: Medium  . OSA on CPAP 09/24/2007    Priority: Medium  . Essential hypertension 09/24/2007    Priority: Medium  . Essential tremor 08/11/2015    Priority: Low  . GERD (gastroesophageal reflux disease) 12/08/2014    Priority: Low  . Obesity 10/25/2008    Priority: Low  . History of colonic polyps 08/22/2008    Priority: Low    Medications- reviewed and updated Current Outpatient Prescriptions  Medication Sig Dispense Refill  . amLODipine (NORVASC) 10 MG tablet Take 10 mg by mouth every morning.    . hydrochlorothiazide (HYDRODIURIL) 25 MG tablet Take 25 mg by mouth daily.    . insulin aspart (NOVOLOG) 100 UNIT/ML injection Inject 40 Units into the skin 3 (three) times daily with meals. When sugar is elevated    . insulin glargine (LANTUS) 100 UNIT/ML injection Inject 50 Units into the skin at bedtime.     . lansoprazole (PREVACID) 30 MG capsule Take 30 mg by mouth daily at 12 noon.    Marland Kitchen levothyroxine (SYNTHROID, LEVOTHROID) 50 MCG tablet Take 50 mcg by mouth daily before breakfast.     . losartan (COZAAR) 100 MG tablet Take 100 mg by mouth  daily.    . mirtazapine (REMERON) 15 MG tablet Take 15 mg by mouth at bedtime.    . ONE TOUCH ULTRA TEST test strip Test 4 times daily. Dx: E11.9 100 each 11  . potassium chloride SA (K-DUR,KLOR-CON) 20 MEQ tablet Take 20 mEq by mouth daily.    . simvastatin (ZOCOR) 40 MG tablet Take 40 mg by mouth daily. [facility]- dose unclear but taking 1/2 tablet    . sucralfate (CARAFATE) 1 G tablet Take 1 g by mouth 4 (four) times daily -  with meals and at bedtime.     No current facility-administered medications for this visit.     Objective: BP 130/70 (BP Location: Left Arm, Patient Position: Sitting, Cuff Size: Large)   Pulse 93   Temp 98.1 F (36.7 C) (Oral)   Wt 266 lb 3.2 oz (120.7 kg)   SpO2 95%   BMI 36.10 kg/m  Gen: NAD, resting comfortably Poor dentition CV: RRR no murmurs rubs or gallops Lungs: CTAB no crackles, wheeze, rhonchi Abdomen: soft/nontender/nondistended/normal bowel sounds. No rebound or guarding.  Ext: no edema Skin: warm, dry, some dry skin on ears  Assessment/Plan:  Diabetes mellitus type 2, controlled ([place]) S: poorly controlled. On lantus 100units, novolog 40-60 units with meals. We had cut down to 40 regularly to help with hypoglycemia with 45 units before  heavy meals and titration to 102 units lantus last itme. Not clear why additional a1c was done.  CBGs-  Running high Mornings 166 this morning pretty typical Before lunch not checking After lucnch not checking Before dinner not checking Before bed- usually in 300s.  Only 1 low blood sugar since last visit and that was 71 so overall better.   Exercise and diet- poor Lab Results  Component Value Date   HGBA1C 7.5 08/28/2016   HGBA1C 6.7 06/18/2016   HGBA1C 6.5 02/27/2016   A/P: a1c and sugars trending up but no hypoglycemia. Titrate to 42 units novolog before meals and then continue 102 units lantus. Follow up by mychart 1 month. Going on cruise and told him if regularly running high can do 45 with each  meal  Hyperlipidemia S: suspect improved controlled on simvastatin 20mg  (half of 40mg )- triglycerides reportedly down to 300s and LDL <100. Lipids not abstracted in No myalgias.   A/P: continue current medication, reasonable control  Diabetic retinopathy S: following with optho. We are working to keep a1c below 7 or 8 but without hypoglycemia A/P: also see DM section  Hypertension S: controlled on amlodipne 10mg , losartan 100 mg, hctz 25 mg.  BP Readings from Last 3 Encounters:  09/19/16 130/70  06/18/16 128/78  05/16/16 122/80  A/P:Continue current meds:  Doing well  January 23rd doing well  Return precautions advised.  [name], MD

## 2016-09-19 NOTE — Assessment & Plan Note (Signed)
S: suspect improved controlled on simvastatin 20mg  (half of 40mg )- triglycerides reportedly down to 300s and LDL <100. Lipids not abstracted in No myalgias.   A/P: continue current medication, reasonable control

## 2016-09-19 NOTE — Progress Notes (Signed)
Pre visit review using our clinic review tool, if applicable. No additional management support is needed unless otherwise documented below in the visit note. 

## 2016-09-19 NOTE — Assessment & Plan Note (Signed)
S: following with optho. We are working to keep a1c below 7 or 8 but without hypoglycemia A/P: also see DM section

## 2016-09-19 NOTE — Patient Instructions (Addendum)
Continue Lantus 102 units ( a little over the 100 mark). Increase novolog to 42 units as long as no lows. Send me message in 1 month with your blood sugars by mychart and will likely continue to increase slowly. Still ok to do 45 units if you are going to have a heavy meal.   Follow up January 23rd at 8:15- schedule before you leave  I would also like for you to sign up for an annual wellness visit with our nurse, Manuela Schwartz, who specializes in the annual wellness exam sometime between now and January when you see me. This is a free benefit under medicare that may help [place] find additional ways to help you. Some highlights are reviewing medications, lifestyle, and doing a dementia screen.   No other changes today- cholesterol does look better

## 2016-10-08 ENCOUNTER — Encounter (HOSPITAL_COMMUNITY): Payer: Self-pay

## 2016-10-15 ENCOUNTER — Ambulatory Visit: Payer: Medicare Other | Admitting: Oncology

## 2016-10-15 ENCOUNTER — Other Ambulatory Visit: Payer: Medicare Other

## 2016-10-23 ENCOUNTER — Encounter (INDEPENDENT_AMBULATORY_CARE_PROVIDER_SITE_OTHER): Payer: Medicare Other | Admitting: Ophthalmology

## 2016-10-23 DIAGNOSIS — E11311 Type 2 diabetes mellitus with unspecified diabetic retinopathy with macular edema: Secondary | ICD-10-CM

## 2016-10-23 DIAGNOSIS — I10 Essential (primary) hypertension: Secondary | ICD-10-CM

## 2016-10-23 DIAGNOSIS — H35033 Hypertensive retinopathy, bilateral: Secondary | ICD-10-CM

## 2016-10-23 DIAGNOSIS — H43813 Vitreous degeneration, bilateral: Secondary | ICD-10-CM

## 2016-10-23 DIAGNOSIS — E113213 Type 2 diabetes mellitus with mild nonproliferative diabetic retinopathy with macular edema, bilateral: Secondary | ICD-10-CM | POA: Diagnosis not present

## 2016-10-23 DIAGNOSIS — H353231 Exudative age-related macular degeneration, bilateral, with active choroidal neovascularization: Secondary | ICD-10-CM | POA: Diagnosis not present

## 2016-10-29 ENCOUNTER — Other Ambulatory Visit (HOSPITAL_BASED_OUTPATIENT_CLINIC_OR_DEPARTMENT_OTHER): Payer: Medicare Other

## 2016-10-29 ENCOUNTER — Telehealth: Payer: Self-pay | Admitting: Oncology

## 2016-10-29 ENCOUNTER — Ambulatory Visit (HOSPITAL_BASED_OUTPATIENT_CLINIC_OR_DEPARTMENT_OTHER): Payer: Medicare Other | Admitting: Oncology

## 2016-10-29 VITALS — BP 148/83 | HR 82 | Temp 98.1°F | Resp 17 | Ht 72.0 in | Wt 269.1 lb

## 2016-10-29 DIAGNOSIS — C9110 Chronic lymphocytic leukemia of B-cell type not having achieved remission: Secondary | ICD-10-CM | POA: Diagnosis not present

## 2016-10-29 DIAGNOSIS — Z8546 Personal history of malignant neoplasm of prostate: Secondary | ICD-10-CM | POA: Diagnosis not present

## 2016-10-29 DIAGNOSIS — C61 Malignant neoplasm of prostate: Secondary | ICD-10-CM

## 2016-10-29 LAB — CBC WITH DIFFERENTIAL/PLATELET
BASO%: 0.4 % (ref 0.0–2.0)
Basophils Absolute: 0 10*3/uL (ref 0.0–0.1)
EOS%: 2.4 % (ref 0.0–7.0)
Eosinophils Absolute: 0.3 10*3/uL (ref 0.0–0.5)
HCT: 43.8 % (ref 38.4–49.9)
HGB: 15.2 g/dL (ref 13.0–17.1)
LYMPH%: 41.2 % (ref 14.0–49.0)
MCH: 28.4 pg (ref 27.2–33.4)
MCHC: 34.7 g/dL (ref 32.0–36.0)
MCV: 81.7 fL (ref 79.3–98.0)
MONO#: 0.7 10*3/uL (ref 0.1–0.9)
MONO%: 6.2 % (ref 0.0–14.0)
NEUT#: 5.4 10*3/uL (ref 1.5–6.5)
NEUT%: 49.8 % (ref 39.0–75.0)
Platelets: 210 10*3/uL (ref 140–400)
RBC: 5.36 10*6/uL (ref 4.20–5.82)
RDW: 14.3 % (ref 11.0–14.6)
WBC: 10.8 10*3/uL — ABNORMAL HIGH (ref 4.0–10.3)
lymph#: 4.4 10*3/uL — ABNORMAL HIGH (ref 0.9–3.3)

## 2016-10-29 LAB — COMPREHENSIVE METABOLIC PANEL
ALT: 49 U/L (ref 0–55)
AST: 36 U/L — ABNORMAL HIGH (ref 5–34)
Albumin: 3.6 g/dL (ref 3.5–5.0)
Alkaline Phosphatase: 59 U/L (ref 40–150)
Anion Gap: 11 mEq/L (ref 3–11)
BUN: 14.2 mg/dL (ref 7.0–26.0)
CO2: 25 mEq/L (ref 22–29)
Calcium: 8.9 mg/dL (ref 8.4–10.4)
Chloride: 105 mEq/L (ref 98–109)
Creatinine: 1 mg/dL (ref 0.7–1.3)
EGFR: 77 mL/min/{1.73_m2} — ABNORMAL LOW (ref >90)
Glucose: 234 mg/dl — ABNORMAL HIGH (ref 70–140)
Potassium: 3.9 mEq/L (ref 3.5–5.1)
Sodium: 141 mEq/L (ref 136–145)
Total Bilirubin: 1.55 mg/dL — ABNORMAL HIGH (ref 0.20–1.20)
Total Protein: 6.5 g/dL (ref 6.4–8.3)

## 2016-10-29 NOTE — Progress Notes (Signed)
Hematology and Oncology Follow Up Visit  [name]:[id number] 1941-09-29 75 y.o. 10/29/2016 9:17 AM [name], MDHunter, David Mars, MD   Principle Diagnosis: 75 year old gentleman with prostate cancer diagnosed in February 2015 he had a Gleason score 3+4 = 7 clinical stage TIc and a PSA of 6.8. 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   Prior Therapy: Sstatus post robotic prostatectomy in February 2015 with the pathology revealed a Gleason score 3+4 = 7 without any evidence of extraprostatic extension.  Current therapy: Observation and surveillance.  Interim History:  [name] presents today for a follow-up visit with his wife. Since the last visit, he reports slight increase in his fatigue and unsteadiness. He denied any fevers, chills or night sweats. He denied any abdominal pain or early satiety. His appetite remain excellent and have gained weight. Has not reported any falls or syncope. Has not reported any chest pain or difficulty breathing. He denied any new urination difficulties. He still ambulates without any difficulties.   Does not report any chest pain or palpitation. He does not report any headaches, blurry vision or syncope. He does not report any nausea, vomiting, change in his bowel habits. He does not report any hematuria, dysuria. He does not report any skeletal complaints of arthralgias or myalgias. Rest of his review of systems unremarkable.  Medications: I have reviewed the patient's current medications.  Current Outpatient Prescriptions  Medication Sig Dispense Refill  . amLODipine (NORVASC) 10 MG tablet Take 10 mg by mouth every morning.    . hydrochlorothiazide (HYDRODIURIL) 25 MG tablet Take 25 mg by mouth daily.    . insulin aspart (NOVOLOG) 100 UNIT/ML injection Inject 40 Units into the skin 3 (three) times daily with meals. When sugar is elevated    . insulin glargine (LANTUS) 100 UNIT/ML injection Inject 50 Units  into the skin at bedtime.     . lansoprazole (PREVACID) 30 MG capsule Take 30 mg by mouth daily at 12 noon.    Marland Kitchen levothyroxine (SYNTHROID, LEVOTHROID) 50 MCG tablet Take 50 mcg by mouth daily before breakfast.     . losartan (COZAAR) 100 MG tablet Take 100 mg by mouth daily.    . mirtazapine (REMERON) 15 MG tablet Take 15 mg by mouth at bedtime.    . ONE TOUCH ULTRA TEST test strip Test 4 times daily. Dx: E11.9 100 each 11  . potassium chloride SA (K-DUR,KLOR-CON) 20 MEQ tablet Take 20 mEq by mouth daily.    . simvastatin (ZOCOR) 40 MG tablet Take 40 mg by mouth daily. [facility]- dose unclear but taking 1/2 tablet    . sucralfate (CARAFATE) 1 G tablet Take 1 g by mouth 4 (four) times daily -  with meals and at bedtime.     No current facility-administered medications for this visit.      Allergies:  Allergies  Allergen Reactions  . Morphine Sulfate Itching and Rash    Past Medical History, Surgical history, Social history, and Family History were reviewed and updated.   Blood pressure (!) 148/83, pulse 82, temperature 98.1 F (36.7 C), temperature source Oral, resp. rate 17, height 6' (1.829 m), weight 269 lb 1.6 oz (122.1 kg), SpO2 98 %. ECOG: 1 General appearance: Well-appearing gentleman appeared without distress. Head: Normocephalic, without obvious abnormality no oral ulcers or lesions. Neck: no adenopathy Lymph nodes: Cervical, supraclavicular, and axillary nodes normal. Heart:regular rate and rhythm, S1, S2 normal, no murmur, click, rub or gallop  Lung:chest clear, no wheezing, rales, normal symmetric air entry. Abdomin: soft, non-tender, without masses or organomegaly no shifting dullness or ascites. EXT:no erythema, induration, or nodules   Lab Results: Lab Results  Component Value Date   WBC 10.8 (H) 10/29/2016   HGB 15.2 10/29/2016   HCT 43.8 10/29/2016   MCV 81.7 10/29/2016   PLT 210 10/29/2016     Chemistry      Component Value Date/Time   NA 140  08/28/2016   NA 140 04/09/2016 0840   K 3.6 08/28/2016   K 3.3 (L) 04/09/2016 0840   CL 100 01/13/2014 1450   CO2 28 04/09/2016 0840   BUN 16.9 04/09/2016 0840   CREATININE 1.0 08/28/2016   CREATININE 1.2 04/09/2016 0840   GLU 223 08/28/2016      Component Value Date/Time   CALCIUM 9.5 04/09/2016 0840   ALKPHOS 44 04/09/2016 0840   AST 27 04/09/2016 0840   ALT 34 04/09/2016 0840   BILITOT 1.21 (H) 04/09/2016 0840      Results for [name], [name] (MRN [id number]) as of 10/29/2016 09:04  Ref. Range 08/28/2016 00:00  PSA Unknown 0.010      Impression and Plan:  75 year old gentleman with the following issues:  1. Prostate cancer diagnosed in February 2015. He presented with a PSA of 6.8 and status post robotic prostatectomy. He is currently under active surveillance. Last PSA continues to be close to undetectable in September 2017. He has no signs or symptoms of recurrent disease.  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 He remained asymptomatic at this time and does not require treatment. His white cell count slightly elevated today and not dramatically changed from previous examinations.  I recommended continued observation and surveillance and only consider treatment if he develops symptoms  3. Follow-up: Will be in 6 months sooner if needed to.    [facility], MD 11/28/20179:17 AM

## 2016-10-29 NOTE — Telephone Encounter (Signed)
Appointments scheduled per 10/29/16 los. A copy of the AVS report and appointment schedule was given to patient, per 10/29/16 los. °

## 2016-11-11 ENCOUNTER — Encounter: Payer: Self-pay | Admitting: Family Medicine

## 2016-11-11 ENCOUNTER — Other Ambulatory Visit: Payer: Self-pay

## 2016-12-24 ENCOUNTER — Encounter: Payer: Self-pay | Admitting: Family Medicine

## 2016-12-24 ENCOUNTER — Ambulatory Visit (INDEPENDENT_AMBULATORY_CARE_PROVIDER_SITE_OTHER): Payer: Medicare Other | Admitting: Family Medicine

## 2016-12-24 VITALS — BP 122/68 | HR 96 | Temp 98.2°F | Ht 72.0 in | Wt 266.2 lb

## 2016-12-24 DIAGNOSIS — Z794 Long term (current) use of insulin: Secondary | ICD-10-CM | POA: Diagnosis not present

## 2016-12-24 DIAGNOSIS — Z8546 Personal history of malignant neoplasm of prostate: Secondary | ICD-10-CM

## 2016-12-24 DIAGNOSIS — C9110 Chronic lymphocytic leukemia of B-cell type not having achieved remission: Secondary | ICD-10-CM

## 2016-12-24 DIAGNOSIS — H6122 Impacted cerumen, left ear: Secondary | ICD-10-CM | POA: Diagnosis not present

## 2016-12-24 DIAGNOSIS — E11319 Type 2 diabetes mellitus with unspecified diabetic retinopathy without macular edema: Secondary | ICD-10-CM | POA: Diagnosis not present

## 2016-12-24 DIAGNOSIS — I10 Essential (primary) hypertension: Secondary | ICD-10-CM

## 2016-12-24 DIAGNOSIS — E113299 Type 2 diabetes mellitus with mild nonproliferative diabetic retinopathy without macular edema, unspecified eye: Secondary | ICD-10-CM

## 2016-12-24 DIAGNOSIS — E785 Hyperlipidemia, unspecified: Secondary | ICD-10-CM

## 2016-12-24 LAB — CBC WITH DIFFERENTIAL/PLATELET
Basophils Absolute: 0.1 10*3/uL (ref 0.0–0.1)
Basophils Relative: 0.6 % (ref 0.0–3.0)
Eosinophils Absolute: 0.4 10*3/uL (ref 0.0–0.7)
Eosinophils Relative: 3.5 % (ref 0.0–5.0)
HCT: 46.4 % (ref 39.0–52.0)
Hemoglobin: 15.9 g/dL (ref 13.0–17.0)
Lymphocytes Relative: 43.2 % (ref 12.0–46.0)
Lymphs Abs: 4.7 10*3/uL — ABNORMAL HIGH (ref 0.7–4.0)
MCHC: 34.2 g/dL (ref 30.0–36.0)
MCV: 82.8 fl (ref 78.0–100.0)
Monocytes Absolute: 0.9 10*3/uL (ref 0.1–1.0)
Monocytes Relative: 8.1 % (ref 3.0–12.0)
Neutro Abs: 4.8 10*3/uL (ref 1.4–7.7)
Neutrophils Relative %: 44.6 % (ref 43.0–77.0)
Platelets: 227 10*3/uL (ref 150.0–400.0)
RBC: 5.61 Mil/uL (ref 4.22–5.81)
RDW: 14.5 % (ref 11.5–15.5)
WBC: 10.8 10*3/uL — ABNORMAL HIGH (ref 4.0–10.5)

## 2016-12-24 LAB — COMPREHENSIVE METABOLIC PANEL
ALT: 42 U/L (ref 0–53)
AST: 37 U/L (ref 0–37)
Albumin: 4.4 g/dL (ref 3.5–5.2)
Alkaline Phosphatase: 53 U/L (ref 39–117)
BUN: 12 mg/dL (ref 6–23)
CO2: 29 mEq/L (ref 19–32)
Calcium: 9.4 mg/dL (ref 8.4–10.5)
Chloride: 101 mEq/L (ref 96–112)
Creatinine, Ser: 0.96 mg/dL (ref 0.40–1.50)
GFR: 81.52 mL/min (ref >60.00)
Glucose, Bld: 208 mg/dL — ABNORMAL HIGH (ref 70–99)
Potassium: 3.8 mEq/L (ref 3.5–5.1)
Sodium: 139 mEq/L (ref 135–145)
Total Bilirubin: 1.2 mg/dL (ref 0.2–1.2)
Total Protein: 6.6 g/dL (ref 6.0–8.3)

## 2016-12-24 LAB — LDL CHOLESTEROL, DIRECT: Direct LDL: 43 mg/dL

## 2016-12-24 LAB — TSH: TSH: 1.35 u[IU]/mL (ref 0.35–4.50)

## 2016-12-24 LAB — HEMOGLOBIN A1C: Hgb A1c MFr Bld: 7.1 % — ABNORMAL HIGH (ref 4.6–6.5)

## 2016-12-24 NOTE — Assessment & Plan Note (Addendum)
S:novolog 42 before meals. Up to 45 units before meals as needed for bigger meals.102 lantus- sometimes takes less.  Brought in log sheet- fasting 123-285 averaging in high 100s. slightly lower before lunch, slightly higher with several above 200 at 2 pm. 8 pm high 100s as well A/P: Will get a1c. Would not be surprised if a1c above 8 but fortunately no lows. He wants to discuss insulin pump at [facility] and depending on what he hears wants local referral to endocrine. With morning sugars being high- discussed regular use of the 102 lantus based off a1c with targetting closer to 80-120 range. See retinopathy

## 2016-12-24 NOTE — Assessment & Plan Note (Addendum)
S: controlled on Amlodipine 10mg , losartan 100mg , HCTZ 25mg .  BP Readings from Last 3 Encounters:  12/24/16 122/68  10/29/16 (!) 148/83  09/19/16 130/70  A/P:Continue current meds:  Doing well

## 2016-12-24 NOTE — Assessment & Plan Note (Signed)
S: reasonably controlled on Half of 40mg  simvastatin. Marland Kitchen No myalgias.  Lab Results  Component Value Date   CHOL 170 05/08/2015   HDL 47 05/08/2015   LDLCALC 95 05/08/2015   LDLDIRECT 90.6 11/16/2013   TRIG 495 (A) 05/08/2015   CHOLHDL 4 11/16/2013   A/P: myalgias on higher doses of statin in past so will not push for LDL under 70

## 2016-12-24 NOTE — Assessment & Plan Note (Signed)
[name] 0.02 PSA recently then at [facility] 0.01 a little bit later. S/p prostatectomy

## 2016-12-24 NOTE — Patient Instructions (Signed)
Labs before you leave  I want you to be consistent with the 102 units of Lantus givne your morning sugars are running so high. Alert [place] if any lows under 75 or so.   Follow up in 14 weeks

## 2016-12-24 NOTE — Progress Notes (Signed)
Subjective:  [name] is a 76 y.o. year old very pleasant male patient who presents for/with See problem oriented charting ROS- some difficulty with food going down when eating (seeing GI this week), no chest pain or shortness of breath, does have pain in his back that makes it hard to walk at times (has scooter through [place])   Past Medical History-  Patient Active Problem List   Diagnosis Date Noted  . Chronic low back pain 12/12/2015    Priority: High  . Chronic lymphocytic leukemia ([place]) 05/16/2014    Priority: High  . History of prostate cancer 01/24/2014    Priority: High  . 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[place]) [id number]    Priority: High  . Hypothyroidism     Priority: Medium  . 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 Depression 09/21/2009    Priority: Medium  . Diabetic retinopathy ([place]) 09/24/2007    Priority: Medium  . Hyperlipidemia 09/24/2007    Priority: Medium  . OSA on CPAP 09/24/2007    Priority: Medium  . Essential hypertension 09/24/2007    Priority: Medium  . Essential tremor 08/11/2015    Priority: Low  . GERD (gastroesophageal reflux disease) 12/08/2014    Priority: Low  . Obesity 10/25/2008    Priority: Low  . History of colonic polyps 08/22/2008    Priority: Low    Medications- reviewed and updated Current Outpatient Prescriptions  Medication Sig Dispense Refill  . amLODipine (NORVASC) 10 MG tablet Take 10 mg by mouth every morning.    . hydrochlorothiazide (HYDRODIURIL) 25 MG tablet Take 25 mg by mouth daily.    . insulin aspart (NOVOLOG) 100 UNIT/ML injection Inject 40 Units into the skin 3 (three) times daily with meals. When sugar is elevated    . insulin glargine (LANTUS) 100 UNIT/ML injection Inject 50 Units into the skin at bedtime.     . lansoprazole (PREVACID) 30 MG capsule Take 30 mg by mouth daily at 12 noon.    Marland Kitchen levothyroxine (SYNTHROID, LEVOTHROID) 50 MCG tablet Take 50 mcg by mouth daily  before breakfast.     . losartan (COZAAR) 100 MG tablet Take 100 mg by mouth daily.    . mirtazapine (REMERON) 15 MG tablet Take 15 mg by mouth at bedtime.    . ONE TOUCH ULTRA TEST test strip Test 4 times daily. Dx: E11.9 100 each 11  . potassium chloride SA (K-DUR,KLOR-CON) 20 MEQ tablet Take 20 mEq by mouth daily.    . simvastatin (ZOCOR) 40 MG tablet Take 40 mg by mouth daily. [facility]- dose unclear but taking 1/2 tablet    . sucralfate (CARAFATE) 1 G tablet Take 1 g by mouth 4 (four) times daily -  with meals and at bedtime.     No current facility-administered medications for this visit.     Objective: BP 122/68 (BP Location: Left Arm, Patient Position: Sitting, Cuff Size: Large)   Pulse 96   Temp 98.2 F (36.8 C) (Oral)   Ht 6' (1.829 m)   Wt 266 lb 3.2 oz (120.7 kg)   SpO2 97%   BMI 36.10 kg/m  Gen: NAD, resting comfortably Cerumen impaction on left side, cleared after irrigation CV: RRR no murmurs rubs or gallops Lungs: CTAB no crackles, wheeze, rhonchi Abdomen: soft/nontender/nondistended/normal bowel sounds. obese  Ext: no edema Skin: warm, dry  Assessment/Plan:  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HCC) S:novolog 42 before meals. Up  to 45 units before meals as needed for bigger meals.102 lantus- sometimes takes less.  Brought in log sheet- fasting 123-285 averaging in high 100s. slightly lower before lunch, slightly higher with several above 200 at 2 pm. 8 pm high 100s as well A/P: Will get a1c. Would not be surprised if a1c above 8 but fortunately no lows. He wants to discuss insulin pump at [facility] and depending on what he hears wants local referral to endocrine. With morning sugars being high- discussed regular use of the 102 lantus based off a1c with targetting closer to 80-120 range. See retinopathy  History of prostate cancer [name] 0.02 PSA recently then at [facility] 0.01 a little bit later. S/p prostatectomy  Chronic lymphocytic leukemia CLL- 6  month follow up oncology. Asymptomatic. He was told that hopefully this will never be an issue for him   Essential hypertension S: controlled on Amlodipine 10mg , losartan 100mg , HCTZ 25mg .  BP Readings from Last 3 Encounters:  12/24/16 122/68  10/29/16 (!) 148/83  09/19/16 130/70  A/P:Continue current meds:  Doing well   Hyperlipidemia S: reasonably controlled on Half of 40mg  simvastatin. Marland Kitchen No myalgias.  Lab Results  Component Value Date   CHOL 170 05/08/2015   HDL 47 05/08/2015   LDLCALC 95 05/08/2015   LDLDIRECT 90.6 11/16/2013   TRIG 495 (A) 05/08/2015   CHOLHDL 4 11/16/2013   A/P: myalgias on higher doses of statin in past so will not push for LDL under 70   Diabetic retinopathy S: diabetic retinopathy followed up by optho yesterday and largely stable. He also has macular degeneration A/P: will await office notes- hopeful a1c below 8  Cerumen impaction- cleared on left side by irrigation.   Return in about 14 weeks (around 04/01/2017).  Orders Placed This Encounter  Procedures  . CBC with Differential/Platelet  . Comprehensive metabolic panel    [place]  . LDL cholesterol, direct    [place]  . Hemoglobin A1c    [place]  . TSH       Return precautions advised.  [name], MD

## 2016-12-24 NOTE — Assessment & Plan Note (Signed)
S: diabetic retinopathy followed up by optho yesterday and largely stable. He also has macular degeneration A/P: will await office notes- hopeful a1c below 8

## 2016-12-24 NOTE — Progress Notes (Signed)
Pre visit review using our clinic review tool, if applicable. No additional management support is needed unless otherwise documented below in the visit note. 

## 2016-12-24 NOTE — Assessment & Plan Note (Signed)
CLL- 6 month follow up oncology. Asymptomatic. He was told that hopefully this will never be an issue for him

## 2016-12-27 ENCOUNTER — Encounter: Payer: Self-pay | Admitting: Physician Assistant

## 2016-12-27 ENCOUNTER — Ambulatory Visit (INDEPENDENT_AMBULATORY_CARE_PROVIDER_SITE_OTHER): Payer: Medicare Other | Admitting: Physician Assistant

## 2016-12-27 VITALS — BP 130/80 | HR 88 | Ht 69.0 in | Wt 269.4 lb

## 2016-12-27 DIAGNOSIS — R1310 Dysphagia, unspecified: Secondary | ICD-10-CM

## 2016-12-27 DIAGNOSIS — Z86010 Personal history of colonic polyps: Secondary | ICD-10-CM | POA: Diagnosis not present

## 2016-12-27 DIAGNOSIS — Z860101 Personal history of adenomatous and serrated colon polyps: Secondary | ICD-10-CM

## 2016-12-27 NOTE — Patient Instructions (Signed)
  You have been scheduled for an endoscopy and colonoscopy. Please follow the written instructions given to you at your visit today. Please pick up your prep supplies at the pharmacy. If you use inhalers (even only as needed), please bring them with you on the day of your procedure.  Adjust your insulin as directed.   I appreciate the opportunity to care for you.

## 2016-12-27 NOTE — Progress Notes (Addendum)
Subjective:    Patient ID: [name], male    DOB: 03-Sep-1941, 76 y.o.   MRN: [id number]  HPI [name] is a very nice 76 year old white male known to Dr. Carlean Purl. He comes in today with complaints of recurrent dysphagia. He says is been having difficulty over the past 6 months, with intermittent symptoms which have progressed somewhat over the past month or so. He has difficulty with bread meats and lettuce getting "stuck". He says this is very uncomfortable and he has to regurgitate to alleviated. Sometimes he is able to resume eating area and he denies any heartburn or indigestion. His appetite is been fine and his weight has been stable. He is status post lap band several years ago and hiatal hernia repair. He says all of the air has been taken out of the lap band. Patient had prior EGD with empiric dilation in 2015 for similar symptoms and says he had very good result without for a long time. Last EGD November 2015 showed a mildly tortuous esophagus there was no definite stricture my he did have impression of the lap band below the GE junction in no definite stenosis. Empiric 69 [place] Venia Minks was passed Last colonoscopy April 2013 for history of adenomatous polyps. He had mild sigmoid diverticulosis but no polyps at that time but was recommended for 5 year follow-up. Other problems include hypertension, obstructive sleep apnea, GERD, adult-onset onset diabetes mellitus with retinopathy and  CLL.  Review of Systems Pertinent positive and negative review of systems were noted in the above HPI section.  All other review of systems was otherwise negative.  Outpatient Encounter Prescriptions as of 12/27/2016  Medication Sig  . amLODipine (NORVASC) 10 MG tablet Take 10 mg by mouth every morning.  . hydrochlorothiazide (HYDRODIURIL) 25 MG tablet Take 25 mg by mouth daily.  . insulin aspart (NOVOLOG) 100 UNIT/ML injection Inject 40 Units into the skin 3 (three) times daily with meals. When sugar  is elevated  . insulin glargine (LANTUS) 100 UNIT/ML injection Inject 50 Units into the skin at bedtime.   . lansoprazole (PREVACID) 30 MG capsule Take 30 mg by mouth daily at 12 noon.  Marland Kitchen levothyroxine (SYNTHROID, LEVOTHROID) 50 MCG tablet Take 50 mcg by mouth daily before breakfast.   . losartan (COZAAR) 100 MG tablet Take 100 mg by mouth daily.  . mirtazapine (REMERON) 15 MG tablet Take 15 mg by mouth at bedtime.  . ONE TOUCH ULTRA TEST test strip Test 4 times daily. Dx: E11.9  . potassium chloride SA (K-DUR,KLOR-CON) 20 MEQ tablet Take 20 mEq by mouth daily.  . simvastatin (ZOCOR) 40 MG tablet Take 40 mg by mouth daily. [facility]- dose unclear but taking 1/2 tablet  . sucralfate (CARAFATE) 1 G tablet Take 1 g by mouth 4 (four) times daily -  with meals and at bedtime.   No facility-administered encounter medications on file as of 12/27/2016.    Allergies  Allergen Reactions  . Morphine Sulfate Itching and Rash   Patient Active Problem List   Diagnosis Date Noted  . Chronic low back pain 12/12/2015  . Essential tremor 08/11/2015  . GERD (gastroesophageal reflux disease) 12/08/2014  . Hypothyroidism   . Chronic lymphocytic leukemia ([place]) 05/16/2014  . History of prostate cancer 01/24/2014  . Lapband APL + HH repair 08/12/2013  . Depression 09/21/2009  . Obesity 10/25/2008  . History of colonic polyps 08/22/2008  . 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[place]) [id number]  . Diabetic retinopathy (  [place]) 09/24/2007  . Hyperlipidemia 09/24/2007  . OSA on CPAP 09/24/2007  . Essential hypertension 09/24/2007   Social History   Social History  . Marital status: Married    Spouse name: N/A  . Number of children: 5  . Years of education: N/A   Occupational History  . retired Retired   Social History Main Topics  . Smoking status: Former Smoker    Packs/day: 1.00    Years: 20.00    Types: Cigarettes    Quit date: 02/25/1981  . Smokeless tobacco: Never Used  .  Alcohol use No  . Drug use: No  . Sexual activity: Yes     Comment: Been able to utilize his penile prosthesis successfully   Other Topics Concern  . Not on file   Social History Narrative   Married. 3 children from previous marriage, 2 step children. 15 grandkids.       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 Kulzer's family history includes Alcohol abuse in his brother; Heart attack in his paternal uncle; Hypertension in his mother and paternal uncle; Stomach cancer (age of onset: 49) in his mother.      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 HEENT; nontraumatic, cephalic EOMI PERRLA sclera anicteric, Cardiovascular; regular rate and rhythm with S1-S2 no murmur or gallop, Pulmonary; clear bilaterally, Abdomen; obese soft nontender nondistended bowel sounds are active there is no palpable mass or hepatosplenomegaly, Rectal; exam not done, Ext; no clubbing cyanosis or edema skin warm and dry, Neuropsych ;mood and affect appropriate       Assessment & Plan:   #20 76 year old white male with recurrent solid food dysphagia 6 months. Patient had prior EGD November 2015 and empiric dilation to 41 [place] for similar complaints. Appendectomy was not found to have a definite stricture but did have a mildly tortuous esophagus and had impression of the lap band obvious below the GE junction. #2 history of adenomatous colon polyps due for follow-up colonoscopy #3 insulin-dependent diabetes mellitus with diabetic retinopathy #4 CLL #5 hypertension #6 obesity #Sleep apnea  Plan; patient will be scheduled for upper Endoscopy with probable dilation and Colonoscopy with Dr. Carlean Purl. Procedures  discussed in detail with the patient including risks and benefits and he is agreeable to proceed. Continue Prevacid 30 mg by mouth every morning.   Khalif Stender Genia Harold PA-C 12/27/2016   Cc: [name], MD  Agree with Ms. Genia Harold assessment and plan. Gatha Mayer, MD, [facility]

## 2016-12-31 ENCOUNTER — Encounter (INDEPENDENT_AMBULATORY_CARE_PROVIDER_SITE_OTHER): Payer: Medicare Other | Admitting: Ophthalmology

## 2016-12-31 DIAGNOSIS — E11311 Type 2 diabetes mellitus with unspecified diabetic retinopathy with macular edema: Secondary | ICD-10-CM

## 2016-12-31 DIAGNOSIS — H35033 Hypertensive retinopathy, bilateral: Secondary | ICD-10-CM | POA: Diagnosis not present

## 2016-12-31 DIAGNOSIS — E113291 Type 2 diabetes mellitus with mild nonproliferative diabetic retinopathy without macular edema, right eye: Secondary | ICD-10-CM | POA: Diagnosis not present

## 2016-12-31 DIAGNOSIS — E113212 Type 2 diabetes mellitus with mild nonproliferative diabetic retinopathy with macular edema, left eye: Secondary | ICD-10-CM | POA: Diagnosis not present

## 2016-12-31 DIAGNOSIS — H353231 Exudative age-related macular degeneration, bilateral, with active choroidal neovascularization: Secondary | ICD-10-CM | POA: Diagnosis not present

## 2016-12-31 DIAGNOSIS — I10 Essential (primary) hypertension: Secondary | ICD-10-CM

## 2016-12-31 DIAGNOSIS — H43813 Vitreous degeneration, bilateral: Secondary | ICD-10-CM

## 2016-12-31 LAB — HM DIABETES EYE EXAM

## 2017-01-01 ENCOUNTER — Encounter: Payer: Self-pay | Admitting: Family Medicine

## 2017-01-27 ENCOUNTER — Ambulatory Visit: Payer: Medicare Other | Admitting: Internal Medicine

## 2017-02-05 DIAGNOSIS — Z8546 Personal history of malignant neoplasm of prostate: Secondary | ICD-10-CM | POA: Diagnosis not present

## 2017-02-12 DIAGNOSIS — Z8546 Personal history of malignant neoplasm of prostate: Secondary | ICD-10-CM | POA: Diagnosis not present

## 2017-02-12 DIAGNOSIS — N5231 Erectile dysfunction following radical prostatectomy: Secondary | ICD-10-CM | POA: Diagnosis not present

## 2017-02-12 DIAGNOSIS — N393 Stress incontinence (female) (male): Secondary | ICD-10-CM | POA: Diagnosis not present

## 2017-02-26 ENCOUNTER — Encounter: Payer: Self-pay | Admitting: Internal Medicine

## 2017-02-26 ENCOUNTER — Ambulatory Visit (AMBULATORY_SURGERY_CENTER): Payer: Medicare Other | Admitting: Internal Medicine

## 2017-02-26 VITALS — BP 122/78 | HR 59 | Temp 98.4°F | Resp 21 | Ht 69.0 in | Wt 269.0 lb

## 2017-02-26 DIAGNOSIS — K5140 Inflammatory polyps of colon without complications: Secondary | ICD-10-CM | POA: Diagnosis not present

## 2017-02-26 DIAGNOSIS — Z860100 Personal history of colon polyps, unspecified: Secondary | ICD-10-CM

## 2017-02-26 DIAGNOSIS — R1310 Dysphagia, unspecified: Secondary | ICD-10-CM | POA: Diagnosis not present

## 2017-02-26 DIAGNOSIS — K635 Polyp of colon: Secondary | ICD-10-CM | POA: Diagnosis not present

## 2017-02-26 DIAGNOSIS — D125 Benign neoplasm of sigmoid colon: Secondary | ICD-10-CM

## 2017-02-26 DIAGNOSIS — I10 Essential (primary) hypertension: Secondary | ICD-10-CM | POA: Diagnosis not present

## 2017-02-26 MED ORDER — SODIUM CHLORIDE 0.9 % IV SOLN: 500.0000 mL | INTRAVENOUS | Status: DC

## 2017-02-26 NOTE — Progress Notes (Signed)
No problems noted in the recovery room. maw 

## 2017-02-26 NOTE — Progress Notes (Signed)
Patient awakening,vss,report to rn 

## 2017-02-26 NOTE — Patient Instructions (Addendum)
I dilated the esophagus - hope that makes the [employer] godown better.  I also removed one polyp. I will let you know pathology results and if or when to have another routine colonoscopy by mail and/or My Chart.  I appreciate the opportunity to care for you. Gatha Mayer, MD, FACG      YOU HAD AN ENDOSCOPIC PROCEDURE TODAY AT [facility]:   Refer to the procedure report that was given to you for any specific questions about what was found during the examination.  If the procedure report does not answer your questions, please call your gastroenterologist to clarify.  If you requested that your care partner not be given the details of your procedure findings, then the procedure report has been included in a sealed envelope for you to review at your convenience later.  YOU SHOULD EXPECT: Some feelings of bloating in the abdomen. Passage of more gas than usual.  Walking can help get rid of the air that was put into your GI tract during the procedure and reduce the bloating. If you had a lower endoscopy (such as a colonoscopy or flexible sigmoidoscopy) you may notice spotting of blood in your stool or on the toilet paper. If you underwent a bowel prep for your procedure, you may not have a normal bowel movement for a few days.  Please Note:  You might notice some irritation and congestion in your nose or some drainage.  This is from the oxygen used during your procedure.  There is no need for concern and it should clear up in a day or so.  SYMPTOMS TO REPORT IMMEDIATELY:   Following lower endoscopy (colonoscopy or flexible sigmoidoscopy):  Excessive amounts of blood in the stool  Significant tenderness or worsening of abdominal pains  Swelling of the abdomen that is new, acute  Fever of 100F or higher   Following upper endoscopy (EGD)  Vomiting of blood or coffee ground material  New chest pain or pain under the shoulder blades  Painful or  persistently difficult swallowing  New shortness of breath  Fever of 100F or higher  Black, tarry-looking stools  For urgent or emergent issues, a gastroenterologist can be reached at any hour by calling [phone].   DIET: Please follow the dilatation diet the rest of today.  Handout was provided.  Drink plenty of fluids but you should avoid alcoholic beverages for 24 hours.  ACTIVITY:  You should plan to take it easy for the rest of today and you should NOT DRIVE or use heavy machinery until tomorrow (because of the sedation medicines used during the test).    FOLLOW UP: Our staff will call the number listed on your records the next business day following your procedure to check on you and address any questions or concerns that you may have regarding the information given to you following your procedure. If we do not reach you, we will leave a message.  However, if you are feeling well and you are not experiencing any problems, there is no need to return our call.  We will assume that you have returned to your regular daily activities without incident.  If any biopsies were taken you will be contacted by phone or by letter within the next 1-3 weeks.  Please call us at [phone] if you have not heard about the biopsies in 3 weeks.    SIGNATURES/CONFIDENTIALITY: You and/or your care partner have signed paperwork which will be  entered into your electronic medical record.  These signatures attest to the fact that that the information above on your After Visit Summary has been reviewed and is understood.  Full responsibility of the confidentiality of this discharge information lies with you and/or your care-partner.   Handouts were given to your care partner on the dilatation diet to follow the rest of the day, polyps and diverticulosis. No aspirin, aspirin products,  ibuprofen, naproxen, advil, motrin, aleve, or other non-steroidal anti-inflammatory drugs for 14 days after polyp  removal. Your blood sugar was 137 in the recovery room. You may resume your other current medications today. Await biopsy results. Please call if any questions or concerns.

## 2017-02-26 NOTE — Progress Notes (Signed)
Called to room to assist during endoscopic procedure.  Patient ID and intended procedure confirmed with present staff. Received instructions for my participation in the procedure from the performing physician.  

## 2017-02-26 NOTE — Op Note (Signed)
[name] Patient Name: [name] Procedure Date: 02/26/2017 7:55 AM MRN: [id number] Endoscopist: Gatha Mayer , MD Age: 76 Referring MD:  Date of Birth: May 01, 1941 Gender: Male Account #: [id number] Procedure:                Colonoscopy Indications:              High risk colon cancer surveillance: Personal                            history of colonic polyps Medicines:                Propofol per Anesthesia, Monitored Anesthesia Care Procedure:                Pre-Anesthesia Assessment:                           - Prior to the procedure, a History and Physical                            was performed, and patient medications and                            allergies were reviewed. The patient's tolerance of                            previous anesthesia was also reviewed. The risks                            and benefits of the procedure and the sedation                            options and risks were discussed with the patient.                            All questions were answered, and informed consent                            was obtained. Prior Anticoagulants: The patient has                            taken no previous anticoagulant or antiplatelet                            agents. ASA Grade Assessment: III - A patient with                            severe systemic disease. After reviewing the risks                            and benefits, the patient was deemed in                            satisfactory condition to undergo the procedure.  After obtaining informed consent, the colonoscope                            was passed under direct vision. Throughout the                            procedure, the patient's blood pressure, pulse, and                            oxygen saturations were monitored continuously. The                            Colonoscope was introduced through the anus and                            advanced to  the the cecum, identified by                            appendiceal orifice and ileocecal valve. The                            colonoscopy was performed without difficulty. The                            patient tolerated the procedure well. The quality                            of the bowel preparation was good. The bowel                            preparation used was Miralax. The ileocecal valve,                            appendiceal orifice, and rectum were photographed. Scope In: 8:07:48 AM Scope Out: 8:22:54 AM Scope Withdrawal Time: 0 hours 12 minutes 43 seconds  Total Procedure Duration: 0 hours 15 minutes 6 seconds  Findings:                 The perianal examination was normal.                           The digital rectal exam findings include surgically                            absent prostate.                           A 10 mm polyp was found in the sigmoid colon. The                            polyp was pedunculated. The polyp was removed with                            a hot snare. Resection and retrieval were complete.  Verification of patient identification for the                            specimen was done. Estimated blood loss was minimal.                           A few small-mouthed diverticula were found in the                            sigmoid colon.                           A single small angiodysplastic lesion without                            bleeding was found in the ascending colon.                           The exam was otherwise without abnormality on                            direct and retroflexion views. Complications:            No immediate complications. Estimated Blood Loss:     Estimated blood loss was minimal. Impression:               - A surgically absent prostate found on digital                            rectal exam.                           - One 10 mm polyp in the sigmoid colon, removed                             with a hot snare. Resected and retrieved.                           - Diverticulosis in the sigmoid colon.                           - A single non-bleeding colonic angiodysplastic                            lesion.                           - The examination was otherwise normal on direct                            and retroflexion views. Recommendation:           - Patient has a contact number available for                            emergencies. The signs and symptoms of potential  delayed complications were discussed with the                            patient. Return to normal activities tomorrow.                            Written discharge instructions were provided to the                            patient.                           - Clear liquids x 1 hour then soft foods rest of                            day. Start prior diet tomorrow.                           - Continue present medications.                           - No aspirin, ibuprofen, naproxen, or other                            non-steroidal anti-inflammatory drugs for 2 weeks                            after polyp removal.                           - Repeat colonoscopy is recommended for                            surveillance. The colonoscopy date will be                            determined after pathology results from today's                            exam become available for review. Gatha Mayer, MD 02/26/2017 8:39:40 AM This report has been signed electronically.

## 2017-02-26 NOTE — Op Note (Signed)
[facility] Patient Name: [name] Procedure Date: 02/26/2017 7:56 AM MRN: [id number] Endoscopist: Gatha Mayer , MD Age: 76 Referring MD:  Date of Birth: [birth year] Gender: Male Account #: [id number] Procedure:                Upper GI endoscopy Indications:              Dysphagia Medicines:                Propofol per Anesthesia, Monitored Anesthesia Care Procedure:                Pre-Anesthesia Assessment:                           - Prior to the procedure, a History and Physical                            was performed, and patient medications and                            allergies were reviewed. The patient's tolerance of                            previous anesthesia was also reviewed. The risks                            and benefits of the procedure and the sedation                            options and risks were discussed with the patient.                            All questions were answered, and informed consent                            was obtained. Prior Anticoagulants: The patient has                            taken no previous anticoagulant or antiplatelet                            agents. ASA Grade Assessment: III - A patient with                            severe systemic disease. After reviewing the risks                            and benefits, the patient was deemed in                            satisfactory condition to undergo the procedure.                           After obtaining informed consent, the endoscope was  passed under direct vision. Throughout the                            procedure, the patient's blood pressure, pulse, and                            oxygen saturations were monitored continuously. The                            Model GIF-[id number]) scope was introduced                            through the mouth, and advanced to the second part                            of duodenum. The  upper GI endoscopy was                            accomplished without difficulty. The patient                            tolerated the procedure well. Scope In: Scope Out: Findings:                 The examined esophagus was moderately tortuous. The                            scope was withdrawn. Dilation was performed with a                            Maloney dilator with mild resistance at 4 Fr. The                            dilation site was examined following endoscope                            reinsertion and showed slight blood but no discrete                            tears in distal esophagus. Estimated blood loss was                            minimal.                           The exam was otherwise without abnormality.                           The cardia and gastric fundus were normal on                            retroflexion. Complications:            No immediate complications. Estimated Blood Loss:     Estimated blood loss was minimal. Impression:               -  Tortuous esophagus.                           - The examination was otherwise normal.                           - No specimens collected. Some photos did not                            capture (esophagus and distal stomach) Recommendation:           - Patient has a contact number available for                            emergencies. The signs and symptoms of potential                            delayed complications were discussed with the                            patient. Return to normal activities tomorrow.                            Written discharge instructions were provided to the                            patient.                           - Clear liquids x 1 hour then soft foods rest of                            day. Start prior diet tomorrow.                           - Continue present medications.                           - Repeat upper endoscopy PRN for retreatment.                            - See the other procedure note for documentation of                            additional recommendations. Gatha Mayer, MD 02/26/2017 8:29:48 AM This report has been signed electronically.

## 2017-02-27 ENCOUNTER — Telehealth: Payer: Self-pay | Admitting: *Deleted

## 2017-02-27 NOTE — Telephone Encounter (Signed)
  Follow up Call-  Call back number 02/26/2017  Post procedure Call Back phone  # [phone]  Permission to leave phone message Yes  Some recent data might be hidden     Patient questions:  Do you have a fever, pain , or abdominal swelling? No. Pain Score  0 *  Have you tolerated food without any problems? Yes.    Have you been able to return to your normal activities? Yes.    Do you have any questions about your discharge instructions: Diet   No. Medications  No. Follow up visit  No.  Do you have questions or concerns about your Care? No.  Actions: * If pain score is 4 or above: No action needed, pain <4.

## 2017-03-10 ENCOUNTER — Encounter: Payer: Self-pay | Admitting: Internal Medicine

## 2017-03-10 NOTE — Progress Notes (Signed)
Inflammatory polyp No recall due to age and co-morbidities My chart letter

## 2017-03-11 ENCOUNTER — Encounter (INDEPENDENT_AMBULATORY_CARE_PROVIDER_SITE_OTHER): Payer: Medicare Other | Admitting: Ophthalmology

## 2017-03-11 DIAGNOSIS — E113311 Type 2 diabetes mellitus with moderate nonproliferative diabetic retinopathy with macular edema, right eye: Secondary | ICD-10-CM

## 2017-03-11 DIAGNOSIS — H35033 Hypertensive retinopathy, bilateral: Secondary | ICD-10-CM | POA: Diagnosis not present

## 2017-03-11 DIAGNOSIS — E113212 Type 2 diabetes mellitus with mild nonproliferative diabetic retinopathy with macular edema, left eye: Secondary | ICD-10-CM | POA: Diagnosis not present

## 2017-03-11 DIAGNOSIS — H35372 Puckering of macula, left eye: Secondary | ICD-10-CM | POA: Diagnosis not present

## 2017-03-11 DIAGNOSIS — I10 Essential (primary) hypertension: Secondary | ICD-10-CM

## 2017-03-11 DIAGNOSIS — H43813 Vitreous degeneration, bilateral: Secondary | ICD-10-CM

## 2017-03-11 DIAGNOSIS — H353231 Exudative age-related macular degeneration, bilateral, with active choroidal neovascularization: Secondary | ICD-10-CM | POA: Diagnosis not present

## 2017-03-11 DIAGNOSIS — E11311 Type 2 diabetes mellitus with unspecified diabetic retinopathy with macular edema: Secondary | ICD-10-CM | POA: Diagnosis not present

## 2017-03-18 ENCOUNTER — Other Ambulatory Visit: Payer: Self-pay | Admitting: Dermatology

## 2017-03-18 DIAGNOSIS — D229 Melanocytic nevi, unspecified: Secondary | ICD-10-CM | POA: Diagnosis not present

## 2017-03-18 DIAGNOSIS — D492 Neoplasm of unspecified behavior of bone, soft tissue, and skin: Secondary | ICD-10-CM | POA: Diagnosis not present

## 2017-03-18 DIAGNOSIS — C4441 Basal cell carcinoma of skin of scalp and neck: Secondary | ICD-10-CM | POA: Diagnosis not present

## 2017-03-18 DIAGNOSIS — L821 Other seborrheic keratosis: Secondary | ICD-10-CM | POA: Diagnosis not present

## 2017-03-18 DIAGNOSIS — C4491 Basal cell carcinoma of skin, unspecified: Secondary | ICD-10-CM

## 2017-03-18 HISTORY — DX: Basal cell carcinoma of skin, unspecified: C44.91

## 2017-04-02 ENCOUNTER — Encounter: Payer: Self-pay | Admitting: Family Medicine

## 2017-04-02 ENCOUNTER — Ambulatory Visit (INDEPENDENT_AMBULATORY_CARE_PROVIDER_SITE_OTHER): Payer: Medicare Other | Admitting: Family Medicine

## 2017-04-02 ENCOUNTER — Other Ambulatory Visit: Payer: Self-pay

## 2017-04-02 VITALS — BP 124/68 | HR 91 | Temp 98.3°F | Ht 69.0 in | Wt 262.6 lb

## 2017-04-02 DIAGNOSIS — I10 Essential (primary) hypertension: Secondary | ICD-10-CM

## 2017-04-02 DIAGNOSIS — Z794 Long term (current) use of insulin: Secondary | ICD-10-CM | POA: Diagnosis not present

## 2017-04-02 DIAGNOSIS — E782 Mixed hyperlipidemia: Secondary | ICD-10-CM | POA: Diagnosis not present

## 2017-04-02 DIAGNOSIS — E11319 Type 2 diabetes mellitus with unspecified diabetic retinopathy without macular edema: Secondary | ICD-10-CM | POA: Diagnosis not present

## 2017-04-02 DIAGNOSIS — Z85828 Personal history of other malignant neoplasm of skin: Secondary | ICD-10-CM | POA: Insufficient documentation

## 2017-04-02 LAB — POCT GLYCOSYLATED HEMOGLOBIN (HGB A1C): Hemoglobin A1C: 7.3

## 2017-04-02 MED ORDER — ONETOUCH ULTRA BLUE VI STRP: ORAL_STRIP | 11 refills | Status: DC

## 2017-04-02 NOTE — Assessment & Plan Note (Signed)
S: reasonabl controlled on half of simvastatin 40mg . No myalgias but has had these on higher doses.  Lab Results  Component Value Date   CHOL 170 05/08/2015   HDL 47 05/08/2015   LDLCALC 95 05/08/2015   LDLDIRECT 43.0 12/24/2016   TRIG 495 (A) 05/08/2015   CHOLHDL 4 11/16/2013   A/P: LDL would more ideally be below 70 and triglycerides lower as well but at least somelowered risk by being on statin. Agrees to come fasting for next visit

## 2017-04-02 NOTE — Progress Notes (Signed)
Subjective:  [name] is a 76 y.o. year old very pleasant male patient who presents for/with See problem oriented charting ROS- denies low blood sugars. No chest pain or shortness of breath. Has low back pain.    Past Medical History-  Patient Active Problem List   Diagnosis Date Noted  . Chronic low back pain 12/12/2015    Priority: High  . Chronic lymphocytic leukemia ([place]) 05/16/2014    Priority: High  . History of prostate cancer 01/24/2014    Priority: High  . 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 Diabetic retinopathy ([place]) 09/24/2007    Priority: High  . Hypothyroidism     Priority: Medium  . 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 Depression 09/21/2009    Priority: Medium  . Hyperlipidemia 09/24/2007    Priority: Medium  . OSA on CPAP 09/24/2007    Priority: Medium  . Essential hypertension 09/24/2007    Priority: Medium  . Personal history of skin cancer 04/02/2017    Priority: Low  . Essential tremor 08/11/2015    Priority: Low  . GERD (gastroesophageal reflux disease) 12/08/2014    Priority: Low  . Morbid obesity ([place]) 10/25/2008    Priority: Low  . History of colonic polyps 08/22/2008    Priority: Low    Medications- reviewed and updated Current Outpatient Prescriptions  Medication Sig Dispense Refill  . amLODipine (NORVASC) 10 MG tablet Take 10 mg by mouth every morning.    . hydrochlorothiazide (HYDRODIURIL) 25 MG tablet Take 25 mg by mouth daily.    . insulin aspart (NOVOLOG) 100 UNIT/ML injection Inject 40 Units into the skin 3 (three) times daily with meals. When sugar is elevated    . insulin glargine (LANTUS) 100 UNIT/ML injection Inject 50 Units into the skin at bedtime.     . lansoprazole (PREVACID) 30 MG capsule Take 30 mg by mouth daily at 12 noon.    Marland Kitchen levothyroxine (SYNTHROID, LEVOTHROID) 50 MCG tablet Take 50 mcg by mouth daily before breakfast.     . losartan  (COZAAR) 100 MG tablet Take 100 mg by mouth daily.    . mirtazapine (REMERON) 15 MG tablet Take 15 mg by mouth at bedtime.    . potassium chloride SA (K-DUR,KLOR-CON) 20 MEQ tablet Take 20 mEq by mouth daily.    . simvastatin (ZOCOR) 40 MG tablet Take 40 mg by mouth daily. [facility]- dose unclear but taking 1/2 tablet    . sucralfate (CARAFATE) 1 G tablet Take 1 g by mouth 4 (four) times daily -  with meals and at bedtime.    . ONE TOUCH ULTRA TEST test strip Test 4 times daily. Dx: E11.9 100 each 11   Objective: BP 124/68 (BP Location: Left Arm, Patient Position: Sitting, Cuff Size: Large)   Pulse 91   Temp 98.3 F (36.8 C) (Oral)   Ht 5\' 9"  (1.753 m)   Wt 262 lb 9.6 oz (119.1 kg)   SpO2 95%   BMI 38.78 kg/m  Gen: NAD, resting comfortably Light amount of cerumen not obstructing bilateral ears CV: RRR no murmurs rubs or gallops Lungs: CTAB no crackles, wheeze, rhonchi Abdomen: soft/nontender/nondistended/normal bowel sounds. obese Ext: no edema Skin: warm, dry, some dry skin behind ears  Assessment/Plan:  Hypertension S: controlled on amlodipine 10mg , losartan 100mg , hctz 25mg .  BP Readings from Last 3 Encounters:  04/02/17 124/68  02/26/17 122/78  12/27/16  130/80  A/P:Continue current meds  Hyperlipidemia S: reasonabl controlled on half of simvastatin 40mg . No myalgias but has had these on higher doses.  Lab Results  Component Value Date   CHOL 170 05/08/2015   HDL 47 05/08/2015   LDLCALC 95 05/08/2015   LDLDIRECT 43.0 12/24/2016   TRIG 495 (A) 05/08/2015   CHOLHDL 4 11/16/2013   A/P: LDL would more ideally be below 70 and triglycerides lower as well but at least somelowered risk by being on statin. Agrees to come fasting for next visit  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HCC) S: mild poorly controlled. On novolog 45 units before meals. 102 units of lantus.  CBGs- yesterday had one over 300. Most #s over 200.  Morning sugars usually around 170  or so.  Exercise and diet- minimal exercise due to back. Has lost 7 lbs but has not been very intentional  Lab Results  Component Value Date   HGBA1C 7.1 (H) 12/24/2016   HGBA1C 7.5 08/28/2016   HGBA1C 6.7 06/18/2016   A/P: updatead a1c is 7.3. We discussed increasing before meal insulin to 46 units and following up in 14 weeks. He should let us know if any sugars under 80.   Morbid obesity ([place]) S: morbid obesity with BMI over 35 and 3 comorbid conditions- diabetes, hypertension, hyperlipidemia A/P: Encouraged need for healthy eating, regular exercise, weight loss.   Fasting next visit Return in about 14 weeks (around 07/09/2017) for follow up- or sooner if needed. come fasting.  Return precautions advised.  [name], MD

## 2017-04-02 NOTE — Assessment & Plan Note (Signed)
S: mild poorly controlled. On novolog 45 units before meals. 102 units of lantus.  CBGs- yesterday had one over 300. Most #s over 200.  Morning sugars usually around 170 or so.  Exercise and diet- minimal exercise due to back. Has lost 7 lbs but has not been very intentional  Lab Results  Component Value Date   HGBA1C 7.1 (H) 12/24/2016   HGBA1C 7.5 08/28/2016   HGBA1C 6.7 06/18/2016   A/P: updatead a1c is 7.3. We discussed increasing before meal insulin to 46 units and following up in 14 weeks. He should let us know if any sugars under 80.

## 2017-04-02 NOTE — Assessment & Plan Note (Signed)
S: morbid obesity with BMI over 35 and 3 comorbid conditions- diabetes, hypertension, hyperlipidemia A/P: Encouraged need for healthy eating, regular exercise, weight loss.

## 2017-04-02 NOTE — Progress Notes (Signed)
Pre visit review using our clinic review tool, if applicable. No additional management support is needed unless otherwise documented below in the visit note. 

## 2017-04-02 NOTE — Patient Instructions (Addendum)
We discussed increasing before meal insulin to 46 units and following up in 14 weeks. He should let us know if any sugars under 80. a1c was 7.3 and goal under 7  I would encourage 3 servings of veggies a day. Water only to drink. Can you follow up with your lap band doctor as well? We really need to help you lose some weight

## 2017-04-14 DIAGNOSIS — M26629 Arthralgia of temporomandibular joint, unspecified side: Secondary | ICD-10-CM | POA: Insufficient documentation

## 2017-04-14 DIAGNOSIS — H938X2 Other specified disorders of left ear: Secondary | ICD-10-CM | POA: Insufficient documentation

## 2017-04-14 DIAGNOSIS — M26622 Arthralgia of left temporomandibular joint: Secondary | ICD-10-CM | POA: Diagnosis not present

## 2017-04-14 HISTORY — DX: Arthralgia of temporomandibular joint, unspecified side: M26.629

## 2017-04-17 ENCOUNTER — Other Ambulatory Visit: Payer: Self-pay | Admitting: Dermatology

## 2017-04-17 DIAGNOSIS — D492 Neoplasm of unspecified behavior of bone, soft tissue, and skin: Secondary | ICD-10-CM | POA: Diagnosis not present

## 2017-04-17 DIAGNOSIS — C4441 Basal cell carcinoma of skin of scalp and neck: Secondary | ICD-10-CM | POA: Diagnosis not present

## 2017-04-17 DIAGNOSIS — L820 Inflamed seborrheic keratosis: Secondary | ICD-10-CM | POA: Diagnosis not present

## 2017-04-21 ENCOUNTER — Encounter: Payer: Self-pay | Admitting: Family Medicine

## 2017-04-22 ENCOUNTER — Encounter: Payer: Self-pay | Admitting: Family Medicine

## 2017-04-22 ENCOUNTER — Other Ambulatory Visit: Payer: Self-pay | Admitting: Family Medicine

## 2017-04-22 ENCOUNTER — Ambulatory Visit (INDEPENDENT_AMBULATORY_CARE_PROVIDER_SITE_OTHER): Payer: Medicare Other | Admitting: Family Medicine

## 2017-04-22 VITALS — BP 128/78 | HR 92 | Temp 98.3°F | Ht 69.0 in | Wt 262.2 lb

## 2017-04-22 DIAGNOSIS — R0789 Other chest pain: Secondary | ICD-10-CM | POA: Diagnosis not present

## 2017-04-22 DIAGNOSIS — R251 Tremor, unspecified: Secondary | ICD-10-CM | POA: Diagnosis not present

## 2017-04-22 NOTE — Patient Instructions (Addendum)
EKG reassuring. Not clear what caused your episode. We discussed cardiology referral which you declined. If you change your mind let us know. If recurrent symptoms seek care immediately  We will call you within a week or two about your referral to neurology. If you do not hear within 3 weeks, give [place] a call.

## 2017-04-22 NOTE — Assessment & Plan Note (Addendum)
S: we have discussed hist tremor before but today seems much more noticeable. Previously had only noted when he reached out his hand and it is still most present with that but today he also has some movement of the hand when resting it- is bilateral so less likely to be parkinsons related. He does have a history of staggering and falls but thought related to his low back issues. He uses a walker or scooter often. Wife expressesthat she has notedl ess and less expression his face over the years and would like neurology consult A/P: will ask for neurology's opinion. I still think tremor is most likely essential tremor but will get their opinion on possible parkinsons.he does not want to pursue medications at present for tremor

## 2017-04-22 NOTE — Progress Notes (Signed)
Subjective:  [name] is a 76 y.o. year old very pleasant male patient who presents for/with See problem oriented charting ROS- felt some diffuse chest tightness yesterday and fatigue. Was not worse with exertion. No shortness of breath above baseline with this. No edema, palpitations, lightheadedness   Past Medical History-  Patient Active Problem List   Diagnosis Date Noted  . Chronic low back pain 12/12/2015    Priority: High  . Chronic lymphocytic leukemia ([place]) 05/16/2014    Priority: High  . History of prostate cancer 01/24/2014    Priority: High  . 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 Diabetic retinopathy ([place]) 09/24/2007    Priority: High  . Hypothyroidism     Priority: Medium  . 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 Depression 09/21/2009    Priority: Medium  . Hyperlipidemia 09/24/2007    Priority: Medium  . OSA on CPAP 09/24/2007    Priority: Medium  . Essential hypertension 09/24/2007    Priority: Medium  . Personal history of skin cancer 04/02/2017    Priority: Low  . Essential tremor 08/11/2015    Priority: Low  . GERD (gastroesophageal reflux disease) 12/08/2014    Priority: Low  . Morbid obesity ([place]) 10/25/2008    Priority: Low  . History of colonic polyps 08/22/2008    Priority: Low    Medications- reviewed and updated Current Outpatient Prescriptions  Medication Sig Dispense Refill  . amLODipine (NORVASC) 10 MG tablet Take 10 mg by mouth every morning.    . hydrochlorothiazide (HYDRODIURIL) 25 MG tablet Take 25 mg by mouth daily.    . insulin aspart (NOVOLOG) 100 UNIT/ML injection Inject 40 Units into the skin 3 (three) times daily with meals. When sugar is elevated    . insulin glargine (LANTUS) 100 UNIT/ML injection Inject 50 Units into the skin at bedtime.     . lansoprazole (PREVACID) 30 MG capsule Take 30 mg by mouth daily at 12 noon.    Marland Kitchen levothyroxine (SYNTHROID,  LEVOTHROID) 50 MCG tablet Take 50 mcg by mouth daily before breakfast.     . losartan (COZAAR) 100 MG tablet Take 100 mg by mouth daily.    . mirtazapine (REMERON) 15 MG tablet Take 15 mg by mouth at bedtime.    . ONE TOUCH ULTRA TEST test strip Test 4 times daily. Dx: E11.9 100 each 11  . potassium chloride SA (K-DUR,KLOR-CON) 20 MEQ tablet Take 20 mEq by mouth daily.    . simvastatin (ZOCOR) 40 MG tablet Take 40 mg by mouth daily. [facility]- dose unclear but taking 1/2 tablet    . sucralfate (CARAFATE) 1 G tablet Take 1 g by mouth 4 (four) times daily -  with meals and at bedtime.     Current Facility-Administered Medications  Medication Dose Route Frequency Provider Last Rate Last Dose  . 0.9 %  sodium chloride infusion  500 mL Intravenous Continuous Gatha Mayer, MD        Objective: BP 128/78 (BP Location: Left Arm, Patient Position: Sitting, Cuff Size: Large)   Pulse 92   Temp 98.3 F (36.8 C) (Oral)   Ht 5\' 9"  (1.753 m)   Wt 262 lb 3.2 oz (118.9 kg)   SpO2 96%   BMI 38.72 kg/m  Gen: NAD, resting comfortably CV: RRR no murmurs rubs or gallops Lungs: CTAB no crackles, wheeze, rhonchi Abdomen: soft/nontender/nondistended/normal bowel sounds. No rebound  or guarding.  Ext: no edema Skin: warm, dry Neuro: grossly normal, moves all extremities  EKG: sinus rhythm with rate of 80, left axis, normal intervals, no hypertrophy, inverted t wave sin v2 and v3, unchanged from exam 10/02/15  Assessment/Plan:  Chest tightness - Plan: EKG 12-Lead S: Woke up yesterday and felt tight diffusely across chest and down into lower ribs. Also felt weak and tired. Denies more stress than normal. Happened started after getting home from some errands around 10 AM. Did not sleep well the night before. With this- did not have shortness of breath, dizziness, not sweaty more than normal. Took a nap and symptoms resolved by the time he woke up in the afternoon. No exertional component to the chest  tightness and was very mild such as 1/10 pain.   Had a stress test 10/12/2015 for shortness of breath which was low risk. ehcocaardiogram was largely normal at that time.  A/P: This pain appears atypical as was not exertional and diffuse tightness through chest. We discussed given risk factors referral to cardiology for their opinion- he firmly declines this yet is aware of potential risk of heart related issues (though I think risks lower with atypical features and reassuring EKG. He is to seek care immediatley for new or worsening symptoms  Tremor S: we have discussed hist tremor before but today seems much more noticeable. Previously had only noted when he reached out his hand and it is still most present with that but today he also has some movement of the hand when resting it- is bilateral so less likely to be parkinsons related. He does have a history of staggering and falls but thought related to his low back issues. He uses a walker or scooter often. Wife expressesthat she has notedl ess and less expression his face over the years and would like neurology consult A/P: will ask for neurology's opinion. I still think tremor is most likely essential tremor but will get their opinion on possible parkinsons.he does not want to pursue medications at present for tremor   Orders Placed This Encounter  Procedures  . EKG 12-Lead    Order Specific Question:   Where should this test be performed    Answer:   Other   The duration of face-to-face time during this visit was greater than 25 minutes. Greater than 50% of this time was spent in counseling, explanation of diagnosis, planning of further management, and/or coordination of care including discussion of ekg results, discussion of follow up and going over ptaients declining option for cardiac follow up, discussing neurology follow up.    Return precautions advised.  [name], MD

## 2017-04-25 ENCOUNTER — Encounter: Payer: Self-pay | Admitting: Neurology

## 2017-04-25 ENCOUNTER — Ambulatory Visit (INDEPENDENT_AMBULATORY_CARE_PROVIDER_SITE_OTHER): Payer: Medicare Other | Admitting: Neurology

## 2017-04-25 VITALS — BP 150/80 | HR 81 | Ht 69.0 in | Wt 236.4 lb

## 2017-04-25 DIAGNOSIS — G250 Essential tremor: Secondary | ICD-10-CM

## 2017-04-25 DIAGNOSIS — E0842 Diabetes mellitus due to underlying condition with diabetic polyneuropathy: Secondary | ICD-10-CM

## 2017-04-25 MED ORDER — PROPRANOLOL HCL 40 MG PO TABS: 40.0000 mg | ORAL_TABLET | Freq: Two times a day (BID) | ORAL | 3 refills | Status: DC

## 2017-04-25 NOTE — Patient Instructions (Signed)
Start propranolol 40mg  take 1 tablet twice daily  Return to clinic as needed

## 2017-04-25 NOTE — Progress Notes (Signed)
Follow-up Visit   Date: 04/25/17    [name] MRN: [id number] DOB: May 03, 1941   Interim History: [name] is a 76 y.o. 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  The patient was accompanied to the clinic by wife who also provides collateral information.     UPDATE 04/25/2017:  Patient is here with new complaints of tremors.  Soon after his diagnosis of prostate cancer, he began noticing intermittent tremors of the hands, especially when he is stressed.  Over the past year, the tremor have become more apparent and now interfere with his fine motor tasks such as holding silverware or his coffee cup.  He has not been dropping objects. He states that "all the Harten's shake" and has a strong family history of hand tremors and head tremor.  No history of parkinsons disease.  He endorses reduced sense of smell and some constipation.  No RLS symptoms.  He has numbness in the feet from diabetic neuropathy.    Medications:  Current Outpatient Prescriptions on File Prior to Visit  Medication Sig Dispense Refill  . amLODipine (NORVASC) 10 MG tablet Take 10 mg by mouth every morning.    . hydrochlorothiazide (HYDRODIURIL) 25 MG tablet Take 25 mg by mouth daily.    . insulin aspart (NOVOLOG) 100 UNIT/ML injection Inject 40 Units into the skin 3 (three) times daily with meals. When sugar is elevated    . insulin glargine (LANTUS) 100 UNIT/ML injection Inject 50 Units into the skin at bedtime.     . lansoprazole (PREVACID) 30 MG capsule Take 30 mg by mouth daily at 12 noon.    Marland Kitchen levothyroxine (SYNTHROID, LEVOTHROID) 50 MCG tablet Take 50 mcg by mouth daily before breakfast.     . losartan (COZAAR) 100 MG tablet Take 100 mg by mouth daily.    . mirtazapine (REMERON) 15 MG tablet  Take 15 mg by mouth at bedtime.    . ONE TOUCH ULTRA TEST test strip Test 4 times daily. Dx: E11.9 100 each 11  . potassium chloride SA (K-DUR,KLOR-CON) 20 MEQ tablet Take 20 mEq by mouth daily.    . simvastatin (ZOCOR) 40 MG tablet Take 40 mg by mouth daily. [facility]- dose unclear but taking 1/2 tablet    . sucralfate (CARAFATE) 1 G tablet Take 1 g by mouth 4 (four) times daily -  with meals and at bedtime.     Current Facility-Administered Medications on File Prior to Visit  Medication Dose Route Frequency Provider Last Rate Last Dose  . 0.9 %  sodium chloride infusion  500 mL Intravenous Continuous Gatha Mayer, MD        Allergies:  Allergies  Allergen Reactions  . Morphine Sulfate Itching and Rash    Review of Systems:  CONSTITUTIONAL: No fevers, chills, night sweats, or weight loss.  EYES: No visual changes or eye pain ENT: No hearing changes.  No history of nose bleeds.   RESPIRATORY: No cough, wheezing and shortness of breath.   CARDIOVASCULAR: Negative for chest pain, and palpitations.   GI: Negative for abdominal discomfort, blood in stools or black stools.  No recent change in bowel habits.   GU:  No history of incontinence.   MUSCLOSKELETAL: No history of joint pain or swelling.  No myalgias.   SKIN: Negative for lesions, rash, and itching.  ENDOCRINE: Negative for cold or heat intolerance, polydipsia or goiter.   PSYCH:  + depression +anxiety symptoms.   NEURO: As Above.   Vital Signs:  BP (!) 150/80   Pulse 81   Ht 5\' 9"  (1.753 m)   Wt 236 lb 7 oz (107.2 kg)   SpO2 97%   BMI 34.92 kg/m   Neurological Exam: MENTAL STATUS including orientation to time, place, person, recent and remote memory, attention span and concentration, language, and fund of knowledge is normal.  Speech is not dysarthric.  CRANIAL NERVES: No visual field defects.  Pupils equal round and reactive to light.  Normal conjugate, extra-ocular eye movements in all directions of gaze.  No  ptosis. Normal facial sensation.  Face is symmetric. Palate elevates symmetrically.  Tongue is midline.  MOTOR:  Motor strength is 5/5 in all extremities.  Bilateral intention tremor of the hands, worse with arms outstretched and at end-point testing.  No atrophy.  No pronator drift.  Tone is normal.    MSRs:  Reflexes are 2+/4 throughout, except absent in the ankles  SENSORY:  Absent vibration distal to ankles, intact at the knee.    COORDINATION/GAIT:  Normal finger-to- nose-finger.  Intact rapid alternating movements bilaterally.  Gait wide-based and stable, unassisted.   Data: MRI lumbar spine wo contrast 05/12/2013: Potentially symptomatic central and leftward protrusion at L2-L3.  Some disc space narrowing associated with a moderate sized Schmorl's node. Potentially symptomatic L3 nerve root encroachment at this level. Small synovial cyst L3-4, right. Moderate disc space narrowing L5-S1 without impingement.  MRI cervical spine wo contrast 05/12/2013:  Multilevel spondylosis as described. Potentially symptomatic neural encroachment at C4-5 and/or C5-6.  Lab Results  Component Value Date   TSH 1.35 12/24/2016     IMPRESSION/PLAN: [name] is a 76 year-old gentleman with known diabetic neuropathy and lumbar degenerative disease returning for evaluation of bilateral hand tremor, which is most consistent with benign essential tremor, as previously mentioned by Dr. Yong Channel. He also has a strong family history of tremors.  Reassured patient that I do not see evidence of Parkinson's disease.  Because it interferes with his fine motor tasks, he would like to try medication to minimize symptoms and will be started on propranolol 40mg  twice daily. Risks and benefits discussed.  He has no history of asthma.  He endorses generalized fatigue and low mood, hopefully he will be able to tolerate low dose beta blocker.  If he develops side effect, primidone is the other options.  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     Return to clinic as needed  The duration of this appointment visit was 25 minutes of face-to-face time with the patient.  Greater than 50% of this time was spent in counseling, explanation of diagnosis, planning of further management, and coordination of care.   Thank you for allowing me to participate in patient's care.  If I can answer any additional questions, I would be pleased to do so.    Sincerely,    Donika K. Posey Pronto, DO

## 2017-04-29 ENCOUNTER — Ambulatory Visit (HOSPITAL_BASED_OUTPATIENT_CLINIC_OR_DEPARTMENT_OTHER): Payer: Medicare Other | Admitting: Oncology

## 2017-04-29 ENCOUNTER — Telehealth: Payer: Self-pay | Admitting: Oncology

## 2017-04-29 ENCOUNTER — Other Ambulatory Visit (HOSPITAL_BASED_OUTPATIENT_CLINIC_OR_DEPARTMENT_OTHER): Payer: Medicare Other

## 2017-04-29 VITALS — BP 148/78 | HR 76 | Temp 98.8°F | Resp 18 | Ht 69.0 in | Wt 266.1 lb

## 2017-04-29 DIAGNOSIS — Z8546 Personal history of malignant neoplasm of prostate: Secondary | ICD-10-CM

## 2017-04-29 DIAGNOSIS — C9110 Chronic lymphocytic leukemia of B-cell type not having achieved remission: Secondary | ICD-10-CM

## 2017-04-29 DIAGNOSIS — Z85828 Personal history of other malignant neoplasm of skin: Secondary | ICD-10-CM | POA: Diagnosis not present

## 2017-04-29 LAB — COMPREHENSIVE METABOLIC PANEL
ALT: 41 U/L (ref 0–55)
AST: 38 U/L — ABNORMAL HIGH (ref 5–34)
Albumin: 3.8 g/dL (ref 3.5–5.0)
Alkaline Phosphatase: 53 U/L (ref 40–150)
Anion Gap: 12 mEq/L — ABNORMAL HIGH (ref 3–11)
BUN: 11.5 mg/dL (ref 7.0–26.0)
CO2: 26 mEq/L (ref 22–29)
Calcium: 9.1 mg/dL (ref 8.4–10.4)
Chloride: 103 mEq/L (ref 98–109)
Creatinine: 1 mg/dL (ref 0.7–1.3)
EGFR: 76 mL/min/{1.73_m2} — ABNORMAL LOW (ref >90)
Glucose: 269 mg/dl — ABNORMAL HIGH (ref 70–140)
Potassium: 3.7 mEq/L (ref 3.5–5.1)
Sodium: 141 mEq/L (ref 136–145)
Total Bilirubin: 1.4 mg/dL — ABNORMAL HIGH (ref 0.20–1.20)
Total Protein: 6.4 g/dL (ref 6.4–8.3)

## 2017-04-29 LAB — CBC WITH DIFFERENTIAL/PLATELET
BASO%: 0.4 % (ref 0.0–2.0)
Basophils Absolute: 0 10*3/uL (ref 0.0–0.1)
EOS%: 3.3 % (ref 0.0–7.0)
Eosinophils Absolute: 0.3 10*3/uL (ref 0.0–0.5)
HCT: 44.6 % (ref 38.4–49.9)
HGB: 15.5 g/dL (ref 13.0–17.1)
LYMPH%: 41.1 % (ref 14.0–49.0)
MCH: 28.4 pg (ref 27.2–33.4)
MCHC: 34.7 g/dL (ref 32.0–36.0)
MCV: 81.9 fL (ref 79.3–98.0)
MONO#: 0.8 10*3/uL (ref 0.1–0.9)
MONO%: 7.5 % (ref 0.0–14.0)
NEUT#: 5 10*3/uL (ref 1.5–6.5)
NEUT%: 47.7 % (ref 39.0–75.0)
Platelets: 175 10*3/uL (ref 140–400)
RBC: 5.45 10*6/uL (ref 4.20–5.82)
RDW: 14.3 % (ref 11.0–14.6)
WBC: 10.6 10*3/uL — ABNORMAL HIGH (ref 4.0–10.3)
lymph#: 4.4 10*3/uL — ABNORMAL HIGH (ref 0.9–3.3)

## 2017-04-29 NOTE — Telephone Encounter (Signed)
Appointments scheduled per 04/29/17 los. Patient was given a copy of the AVS report and appointment schedule per 04/29/17 los.

## 2017-04-29 NOTE — Progress Notes (Signed)
Hematology and Oncology Follow Up Visit  [name] [id number] 1941-10-06 76 y.o. 04/29/2017 10:04 AM [name], MDHunter, [name], MD   Principle Diagnosis: 76 year old gentleman with prostate cancer diagnosed in February 2015 he had a Gleason score 3+4 = 7 clinical stage TIc and a PSA of 6.8. 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   Prior Therapy: Sstatus post robotic prostatectomy in February 2015 with the pathology revealed a Gleason score 3+4 = 7 without any evidence of extraprostatic extension.  Current therapy: Observation and surveillance.  Interim History:  [name] presents today for a follow-up visit with his wife. Since the last visit, he reports no major changes in his health. His mobility still quite limited because of his weight and arthritis. He still ambulating without any falls or syncope. He denied any fevers, chills or night sweats. He denied any abdominal pain or early satiety. His appetite remain excellent and have gained weight. He did have skin cancer removed from his neck and continues to follow with dermatology regarding this issue.   Does not report any chest pain or palpitation. He does not report any headaches, blurry vision or syncope. He does not report any nausea, vomiting, change in his bowel habits. He does not report any hematuria, dysuria. He does not report any skeletal complaints of arthralgias or myalgias. Rest of his review of systems unremarkable.  Medications: I have reviewed the patient's current medications.  Current Outpatient Prescriptions  Medication Sig Dispense Refill  . amLODipine (NORVASC) 10 MG tablet Take 10 mg by mouth every morning.    . hydrochlorothiazide (HYDRODIURIL) 25 MG tablet Take 25 mg by mouth daily.    . insulin aspart (NOVOLOG) 100 UNIT/ML injection Inject 40 Units into the skin 3 (three) times daily with meals. When sugar is elevated    . insulin glargine (LANTUS) 100  UNIT/ML injection Inject 50 Units into the skin at bedtime.     . lansoprazole (PREVACID) 30 MG capsule Take 30 mg by mouth daily at 12 noon.    Marland Kitchen levothyroxine (SYNTHROID, LEVOTHROID) 50 MCG tablet Take 50 mcg by mouth daily before breakfast.     . losartan (COZAAR) 100 MG tablet Take 100 mg by mouth daily.    . mirtazapine (REMERON) 15 MG tablet Take 15 mg by mouth at bedtime.    . ONE TOUCH ULTRA TEST test strip Test 4 times daily. Dx: E11.9 100 each 11  . potassium chloride SA (K-DUR,KLOR-CON) 20 MEQ tablet Take 20 mEq by mouth daily.    . propranolol (INDERAL) 40 MG tablet Take 1 tablet (40 mg total) by mouth 2 (two) times daily. 180 tablet 3  . simvastatin (ZOCOR) 40 MG tablet Take 40 mg by mouth daily. [facility]- dose unclear but taking 1/2 tablet    . sucralfate (CARAFATE) 1 G tablet Take 1 g by mouth 4 (four) times daily -  with meals and at bedtime.     Current Facility-Administered Medications  Medication Dose Route Frequency Provider Last Rate Last Dose  . 0.9 %  sodium chloride infusion  500 mL Intravenous Continuous Gatha Mayer, MD         Allergies:  Allergies  Allergen Reactions  . Morphine Sulfate Itching and Rash    Past Medical History, Surgical history, Social history, and Family History were reviewed and updated.   Blood pressure (!) 148/78, pulse 76, temperature 98.8 F (37.1 C), temperature source Oral, resp. rate 18,  height 5\' 9"  (1.753 m), weight 266 lb 1.6 oz (120.7 kg), SpO2 98 %. ECOG: 1 General appearance: Alert, awake gentleman without distress. Head: Normocephalic, without obvious abnormality no oral thrush or ulcers. Neck: no adenopathy Lymph nodes: Cervical, supraclavicular, and axillary nodes normal. Heart:regular rate and rhythm, S1, S2 normal, no murmur, click, rub or gallop Lung:chest clear, no wheezing, rales, normal symmetric air entry. Abdomin: soft, non-tender, without masses or organomegaly no rebound or guarding. EXT:no erythema,  induration, or nodules   Lab Results: Lab Results  Component Value Date   WBC 10.6 (H) 04/29/2017   HGB 15.5 04/29/2017   HCT 44.6 04/29/2017   MCV 81.9 04/29/2017   PLT 175 04/29/2017     Chemistry      Component Value Date/Time   NA 141 04/29/2017 0909   K 3.7 04/29/2017 0909   CL 101 12/24/2016 0842   CO2 26 04/29/2017 0909   BUN 11.5 04/29/2017 0909   CREATININE 1.0 04/29/2017 0909   GLU 223 08/28/2016      Component Value Date/Time   CALCIUM 9.1 04/29/2017 0909   ALKPHOS 53 04/29/2017 0909   AST 38 (H) 04/29/2017 0909   ALT 41 04/29/2017 0909   BILITOT 1.40 (H) 04/29/2017 0909         Impression and Plan:  76 year old gentleman with the following issues:  1. Prostate cancer diagnosed in February 2015. He presented with a PSA of 6.8 and status post robotic prostatectomy. He is currently under active surveillance. Last PSA continues to be close to undetectable in September 2017. He has no signs or symptoms of recurrent disease.  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 He remained asymptomatic at this time and does not require treatment.  Laboratory data were reviewed today showed a hemoglobin of 15.5 and normal white cell count of 10.6.  I recommended continued observation and surveillance and only consider treatment if he develops symptoms  3. Follow-up: Will be in 6 months.     [facility], MD 5/29/201810:04 AM

## 2017-04-30 DIAGNOSIS — M47816 Spondylosis without myelopathy or radiculopathy, lumbar region: Secondary | ICD-10-CM | POA: Diagnosis not present

## 2017-04-30 DIAGNOSIS — G8929 Other chronic pain: Secondary | ICD-10-CM | POA: Diagnosis not present

## 2017-04-30 DIAGNOSIS — M545 Low back pain: Secondary | ICD-10-CM | POA: Diagnosis not present

## 2017-04-30 DIAGNOSIS — G894 Chronic pain syndrome: Secondary | ICD-10-CM | POA: Diagnosis not present

## 2017-05-12 ENCOUNTER — Encounter (INDEPENDENT_AMBULATORY_CARE_PROVIDER_SITE_OTHER): Payer: Medicare Other | Admitting: Ophthalmology

## 2017-05-12 DIAGNOSIS — H353231 Exudative age-related macular degeneration, bilateral, with active choroidal neovascularization: Secondary | ICD-10-CM

## 2017-05-12 DIAGNOSIS — H43813 Vitreous degeneration, bilateral: Secondary | ICD-10-CM

## 2017-05-12 DIAGNOSIS — E11311 Type 2 diabetes mellitus with unspecified diabetic retinopathy with macular edema: Secondary | ICD-10-CM

## 2017-05-12 DIAGNOSIS — E113311 Type 2 diabetes mellitus with moderate nonproliferative diabetic retinopathy with macular edema, right eye: Secondary | ICD-10-CM

## 2017-05-12 DIAGNOSIS — I10 Essential (primary) hypertension: Secondary | ICD-10-CM | POA: Diagnosis not present

## 2017-05-12 DIAGNOSIS — E113212 Type 2 diabetes mellitus with mild nonproliferative diabetic retinopathy with macular edema, left eye: Secondary | ICD-10-CM

## 2017-05-12 DIAGNOSIS — H35033 Hypertensive retinopathy, bilateral: Secondary | ICD-10-CM | POA: Diagnosis not present

## 2017-05-20 ENCOUNTER — Encounter (INDEPENDENT_AMBULATORY_CARE_PROVIDER_SITE_OTHER): Payer: Medicare Other | Admitting: Ophthalmology

## 2017-05-26 DIAGNOSIS — G894 Chronic pain syndrome: Secondary | ICD-10-CM | POA: Diagnosis not present

## 2017-05-26 DIAGNOSIS — Z8546 Personal history of malignant neoplasm of prostate: Secondary | ICD-10-CM | POA: Diagnosis not present

## 2017-05-26 DIAGNOSIS — G4733 Obstructive sleep apnea (adult) (pediatric): Secondary | ICD-10-CM | POA: Diagnosis not present

## 2017-05-26 DIAGNOSIS — Z79899 Other long term (current) drug therapy: Secondary | ICD-10-CM | POA: Diagnosis not present

## 2017-05-26 DIAGNOSIS — Z794 Long term (current) use of insulin: Secondary | ICD-10-CM | POA: Diagnosis not present

## 2017-05-26 DIAGNOSIS — M47817 Spondylosis without myelopathy or radiculopathy, lumbosacral region: Secondary | ICD-10-CM | POA: Diagnosis not present

## 2017-05-26 DIAGNOSIS — I10 Essential (primary) hypertension: Secondary | ICD-10-CM | POA: Diagnosis not present

## 2017-05-26 DIAGNOSIS — E119 Type 2 diabetes mellitus without complications: Secondary | ICD-10-CM | POA: Diagnosis not present

## 2017-05-26 DIAGNOSIS — M47816 Spondylosis without myelopathy or radiculopathy, lumbar region: Secondary | ICD-10-CM | POA: Diagnosis not present

## 2017-05-26 DIAGNOSIS — Z87891 Personal history of nicotine dependence: Secondary | ICD-10-CM | POA: Diagnosis not present

## 2017-05-26 DIAGNOSIS — F4310 Post-traumatic stress disorder, unspecified: Secondary | ICD-10-CM | POA: Diagnosis not present

## 2017-05-27 ENCOUNTER — Other Ambulatory Visit: Payer: Self-pay | Admitting: Family Medicine

## 2017-06-03 ENCOUNTER — Other Ambulatory Visit: Payer: Self-pay

## 2017-06-03 DIAGNOSIS — B079 Viral wart, unspecified: Secondary | ICD-10-CM

## 2017-06-23 DIAGNOSIS — G4733 Obstructive sleep apnea (adult) (pediatric): Secondary | ICD-10-CM | POA: Diagnosis not present

## 2017-06-23 DIAGNOSIS — Z8546 Personal history of malignant neoplasm of prostate: Secondary | ICD-10-CM | POA: Diagnosis not present

## 2017-06-23 DIAGNOSIS — E119 Type 2 diabetes mellitus without complications: Secondary | ICD-10-CM | POA: Diagnosis not present

## 2017-06-23 DIAGNOSIS — Z87891 Personal history of nicotine dependence: Secondary | ICD-10-CM | POA: Diagnosis not present

## 2017-06-23 DIAGNOSIS — Z794 Long term (current) use of insulin: Secondary | ICD-10-CM | POA: Diagnosis not present

## 2017-06-23 DIAGNOSIS — G894 Chronic pain syndrome: Secondary | ICD-10-CM | POA: Diagnosis not present

## 2017-06-23 DIAGNOSIS — M47817 Spondylosis without myelopathy or radiculopathy, lumbosacral region: Secondary | ICD-10-CM | POA: Diagnosis not present

## 2017-06-23 DIAGNOSIS — M47816 Spondylosis without myelopathy or radiculopathy, lumbar region: Secondary | ICD-10-CM | POA: Diagnosis not present

## 2017-06-23 DIAGNOSIS — Z79899 Other long term (current) drug therapy: Secondary | ICD-10-CM | POA: Diagnosis not present

## 2017-06-23 DIAGNOSIS — I10 Essential (primary) hypertension: Secondary | ICD-10-CM | POA: Diagnosis not present

## 2017-07-07 ENCOUNTER — Encounter: Payer: Self-pay | Admitting: Podiatry

## 2017-07-07 ENCOUNTER — Ambulatory Visit (INDEPENDENT_AMBULATORY_CARE_PROVIDER_SITE_OTHER): Payer: Medicare Other | Admitting: Podiatry

## 2017-07-07 DIAGNOSIS — B070 Plantar wart: Secondary | ICD-10-CM | POA: Diagnosis not present

## 2017-07-07 NOTE — Progress Notes (Signed)
   Subjective: Patient with a history of diabetes mellitus presents today with pain and tenderness on the plantar aspect of the left foot secondary to a plantars wart. Patient states that the pain has been present for several weeks now. Patient denies trauma.  Objective: Physical Exam General: The patient is alert and oriented x3 in no acute distress.  Dermatology: Hyperkeratotic skin lesion noted to the plantar aspect of the left foot approximately 0.5 cm in diameter underlying the left great toe as well as underlying the medial longitudinal arch. Pinpoint bleeding noted upon debridement. Skin is warm, dry and supple bilateral lower extremities. Negative for open lesions or macerations.  Vascular: Palpable pedal pulses bilaterally. No edema or erythema noted. Capillary refill within normal limits.  Neurological: Epicritic and protective threshold grossly intact bilaterally.   Musculoskeletal Exam: Pain on palpation to the note skin lesion.  Range of motion within normal limits to all pedal and ankle joints bilateral. Muscle strength 5/5 in all groups bilateral.   Assessment: #1 plantar wart left foot #2 pain in left foot   Plan of Care:  #1 Patient was evaluated. #2 Excisional debridement of the plantar wart lesion was performed using a chisel blade. Cantharone was applied and the lesion was dressed with a dry sterile dressing. #3 patient is to return to clinic in 2 weeks Edrick Kins, DPM [facility]  Dr. Edrick Kins, [address]                                        [place], [place] [address]                Office [phone]  Fax [phone]

## 2017-07-08 ENCOUNTER — Telehealth: Payer: Self-pay | Admitting: Podiatry

## 2017-07-08 NOTE — Telephone Encounter (Signed)
My husband was seen yesterday and Dr. Amalia Hailey took some warts off. Today he is in a lot of pain and is walking on the side of his foot. To me, it looks red. We would like an antibiotic called in to make sure everything goes according to the procedure. You can call us at home at [phone].

## 2017-07-08 NOTE — Telephone Encounter (Signed)
I instructed pt to cleanse area in the shower with antibacterial soap, rinse then cover with antibiotic ointment, and rest, if able take OTC ibuprofen as package directs. I told pt I would inform Dr. Amalia Hailey and call again with instructions. Pt states understanding.

## 2017-07-16 ENCOUNTER — Ambulatory Visit (INDEPENDENT_AMBULATORY_CARE_PROVIDER_SITE_OTHER): Payer: Medicare Other | Admitting: Family Medicine

## 2017-07-16 ENCOUNTER — Encounter: Payer: Self-pay | Admitting: Family Medicine

## 2017-07-16 VITALS — BP 122/82 | HR 53 | Temp 98.2°F | Ht 69.0 in | Wt 263.2 lb

## 2017-07-16 DIAGNOSIS — E11319 Type 2 diabetes mellitus with unspecified diabetic retinopathy without macular edema: Secondary | ICD-10-CM | POA: Diagnosis not present

## 2017-07-16 DIAGNOSIS — E782 Mixed hyperlipidemia: Secondary | ICD-10-CM

## 2017-07-16 DIAGNOSIS — E034 Atrophy of thyroid (acquired): Secondary | ICD-10-CM | POA: Diagnosis not present

## 2017-07-16 DIAGNOSIS — I10 Essential (primary) hypertension: Secondary | ICD-10-CM

## 2017-07-16 DIAGNOSIS — Z794 Long term (current) use of insulin: Secondary | ICD-10-CM | POA: Diagnosis not present

## 2017-07-16 DIAGNOSIS — R251 Tremor, unspecified: Secondary | ICD-10-CM

## 2017-07-16 LAB — CBC
HCT: 45.4 % (ref 39.0–52.0)
Hemoglobin: 15.3 g/dL (ref 13.0–17.0)
MCHC: 33.6 g/dL (ref 30.0–36.0)
MCV: 84.9 fl (ref 78.0–100.0)
Platelets: 220 10*3/uL (ref 150.0–400.0)
RBC: 5.35 Mil/uL (ref 4.22–5.81)
RDW: 14.7 % (ref 11.5–15.5)
WBC: 13.3 10*3/uL — ABNORMAL HIGH (ref 4.0–10.5)

## 2017-07-16 LAB — COMPREHENSIVE METABOLIC PANEL
ALT: 26 U/L (ref 0–53)
AST: 21 U/L (ref 0–37)
Albumin: 4.5 g/dL (ref 3.5–5.2)
Alkaline Phosphatase: 44 U/L (ref 39–117)
BUN: 16 mg/dL (ref 6–23)
CO2: 29 mEq/L (ref 19–32)
Calcium: 9 mg/dL (ref 8.4–10.5)
Chloride: 104 mEq/L (ref 96–112)
Creatinine, Ser: 0.97 mg/dL (ref 0.40–1.50)
GFR: 80.43 mL/min (ref >60.00)
Glucose, Bld: 86 mg/dL (ref 70–99)
Potassium: 3.8 mEq/L (ref 3.5–5.1)
Sodium: 142 mEq/L (ref 135–145)
Total Bilirubin: 0.9 mg/dL (ref 0.2–1.2)
Total Protein: 6.1 g/dL (ref 6.0–8.3)

## 2017-07-16 LAB — LIPID PANEL
Cholesterol: 116 mg/dL (ref 0–200)
HDL: 56.9 mg/dL (ref >39.00)
LDL Cholesterol: 33 mg/dL (ref 0–99)
NonHDL: 59.59
Total CHOL/HDL Ratio: 2
Triglycerides: 134 mg/dL (ref 0.0–149.0)
VLDL: 26.8 mg/dL (ref 0.0–40.0)

## 2017-07-16 LAB — HEMOGLOBIN A1C: Hgb A1c MFr Bld: 7.4 % — ABNORMAL HIGH (ref 4.6–6.5)

## 2017-07-16 LAB — TSH: TSH: 1.42 u[IU]/mL (ref 0.35–4.50)

## 2017-07-16 NOTE — Assessment & Plan Note (Signed)
At goal on amlodipine 10mg , hctz 25mg , losartan 100mg , propranolol

## 2017-07-16 NOTE — Progress Notes (Signed)
Subjective:  [name] is a 76 y.o. year old very pleasant male patient who presents for/with See problem oriented charting ROS- No chest pain or shortness of breath. No headache or blurry vision.    Past Medical History-  Patient Active Problem List   Diagnosis Date Noted  . Chronic bilateral low back pain without sciatica 12/12/2015    Priority: High  . Chronic lymphocytic leukemia ([place]) 05/16/2014    Priority: High  . History of prostate cancer 01/24/2014    Priority: High  . 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[place]) 16/06/3709    Priority: High  . Diabetic retinopathy ([place]) 09/24/2007    Priority: High  . Tremor 08/11/2015    Priority: Medium  . Hypothyroidism     Priority: Medium  . 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 Depression 09/21/2009    Priority: Medium  . Hyperlipidemia 09/24/2007    Priority: Medium  . OSA on CPAP 09/24/2007    Priority: Medium  . Essential hypertension 09/24/2007    Priority: Medium  . Personal history of skin cancer 04/02/2017    Priority: Low  . GERD (gastroesophageal reflux disease) 12/08/2014    Priority: Low  . Morbid obesity ([place]) 10/25/2008    Priority: Low  . History of colonic polyps 08/22/2008    Priority: Low  . Ear pressure, left 04/14/2017  . Temporomandibular joint (TMJ) pain 04/14/2017  . Pain syndrome, chronic 02/01/2016  . 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 myelopathy or radiculopathy 02/01/2016    Medications- reviewed and updated Current Outpatient Prescriptions  Medication Sig Dispense Refill  . amLODipine (NORVASC) 10 MG tablet Take 10 mg by mouth every morning.    . hydrochlorothiazide (HYDRODIURIL) 25 MG tablet Take 25 mg by mouth daily.    . insulin aspart (NOVOLOG) 100 UNIT/ML injection Inject 45 Units into the skin 3 (three) times daily with meals. When sugar is elevated    . insulin glargine (LANTUS) 100 UNIT/ML injection Inject 102 Units into the skin at  bedtime.     . lansoprazole (PREVACID) 30 MG capsule Take 30 mg by mouth daily at 12 noon.    Marland Kitchen levothyroxine (SYNTHROID, LEVOTHROID) 50 MCG tablet Take 50 mcg by mouth daily before breakfast.     . losartan (COZAAR) 100 MG tablet Take 100 mg by mouth daily.    . mirtazapine (REMERON) 15 MG tablet Take 15 mg by mouth at bedtime.    . ONE TOUCH ULTRA TEST test strip Test 4 times daily. Dx: E11.9 100 each 11  . potassium chloride SA (K-DUR,KLOR-CON) 20 MEQ tablet Take 20 mEq by mouth daily.    . propranolol (INDERAL) 40 MG tablet Take 1 tablet (40 mg total) by mouth 2 (two) times daily. 180 tablet 3  . simvastatin (ZOCOR) 40 MG tablet Take 40 mg by mouth daily. [facility]- dose unclear but taking 1/2 tablet    . sucralfate (CARAFATE) 1 G tablet Take 1 g by mouth 4 (four) times daily -  with meals and at bedtime.     No current facility-administered medications for this visit.     Objective: BP 122/82 (BP Location: Left Arm, Patient Position: Sitting, Cuff Size: Large)   Pulse (!) 53   Temp 98.2 F (36.8 C) (Oral)   Ht 5\' 9"  (1.753 m)   Wt 263 lb 3.2 oz (119.4 kg)   SpO2 96%   BMI 38.87 kg/m  Gen: NAD, resting comfortably CV: RRR (  other than slightly bradycardic on propranolol) no murmurs rubs or gallops Lungs: CTAB no crackles, wheeze, rhonchi Abdomen: soft/nontender/nondistended/normal bowel sounds. obese Ext: no edema Skin: warm, dry  Diabetic Foot Exam - Simple   Simple Foot Form Diabetic Foot exam was performed with the following findings:  Yes 07/16/2017  8:26 AM  Visual Inspection No deformities, no ulcerations, no other skin breakdown bilaterally:  Yes Sensation Testing See comments:  Yes Pulse Check Posterior Tibialis and Dorsalis pulse intact bilaterally:  Yes Comments Monofilament sensation starts around the ankle. Stable per patient.      Assessment/Plan:   Essential hypertension At goal on amlodipine 10mg , hctz 25mg , losartan 100mg , propranolol  Type 2  diabetes mellitus with ophthalmic complication (HCC) Diabetic retinopathy S: hopefully controlled. On 102 units of lantus and 46 units before meals (increased from 45 last visit). He saw optho on 12/31/16 and he reports told eyes were stable .  CBGs- between 100-150 in Am down from 170s.  Exercise and diet- back limits exercise. Encouraged chair exercises - seems uninterested.  Lab Results  Component Value Date   HGBA1C 7.3 04/02/2017   HGBA1C 7.1 (H) 12/24/2016   HGBA1C 7.5 08/28/2016   A/P: update labs today- no changes in meds  Hypothyroidism S: Lab Results  Component Value Date   TSH 1.35 12/24/2016   On thyroid medication-levothyroxine 50 mcg A/P: update tsh today  Hyperlipidemia S: reasonaly controlled on 20mg  simvastatin (half of 40mg  tablet). No myalgias.  Lab Results  Component Value Date   CHOL 170 05/08/2015   HDL 47 05/08/2015   LDLCALC 95 05/08/2015   LDLDIRECT 43.0 12/24/2016   TRIG 495 (A) 05/08/2015   CHOLHDL 4 11/16/2013   A/P: update lipids today- hopeful triglycerides at least under 500 but under 200 would be even better  Tremor Essential tremor. Bilateral. Propranolol through Dr. Posey Pronto  Noted 1 cbg in 75s earlier this week- had not eaten a good meal- quickly treated this- ok to continue current regimen Return in about 4 months (around 11/15/2017) for follow up- or sooner if needed.  Orders Placed This Encounter  Procedures  . CBC    [place]  . Comprehensive metabolic panel    [place]    Order Specific Question:   Has the patient fasted?    Answer:   No  . Hemoglobin A1c    [place]  . TSH    [place]  . Lipid panel    [place]    Order Specific Question:   Has the patient fasted?    Answer:   No   Return precautions advised.  [name], MD

## 2017-07-16 NOTE — Assessment & Plan Note (Signed)
S: reasonaly controlled on 20mg  simvastatin (half of 40mg  tablet). No myalgias.  Lab Results  Component Value Date   CHOL 170 05/08/2015   HDL 47 05/08/2015   LDLCALC 95 05/08/2015   LDLDIRECT 43.0 12/24/2016   TRIG 495 (A) 05/08/2015   CHOLHDL 4 11/16/2013   A/P: update lipids today- hopeful triglycerides at least under 500 but under 200 would be even better

## 2017-07-16 NOTE — Assessment & Plan Note (Signed)
Essential tremor. Bilateral. Propranolol through Dr. Posey Pronto

## 2017-07-16 NOTE — Assessment & Plan Note (Signed)
S: Lab Results  Component Value Date   TSH 1.35 12/24/2016   On thyroid medication-levothyroxine 50 mcg A/P: update tsh today

## 2017-07-16 NOTE — Assessment & Plan Note (Signed)
Diabetic retinopathy S: hopefully controlled. On 102 units of lantus and 46 units before meals (increased from 45 last visit). He saw optho on 12/31/16 and he reports told eyes were stable .  CBGs- between 100-150 in Am down from 170s.  Exercise and diet- back limits exercise. Encouraged chair exercises - seems uninterested.  Lab Results  Component Value Date   HGBA1C 7.3 04/02/2017   HGBA1C 7.1 (H) 12/24/2016   HGBA1C 7.5 08/28/2016   A/P: update labs today- no changes in meds

## 2017-07-16 NOTE — Patient Instructions (Signed)
Please stop by lab before you go  No changes planned today. Let me know if you are getting more than very rare low blood sugars into the 60s (like more than once a month for example)

## 2017-07-21 ENCOUNTER — Encounter: Payer: Self-pay | Admitting: Podiatry

## 2017-07-21 ENCOUNTER — Encounter (INDEPENDENT_AMBULATORY_CARE_PROVIDER_SITE_OTHER): Payer: Medicare Other | Admitting: Ophthalmology

## 2017-07-21 ENCOUNTER — Ambulatory Visit (INDEPENDENT_AMBULATORY_CARE_PROVIDER_SITE_OTHER): Payer: Medicare Other | Admitting: Podiatry

## 2017-07-21 DIAGNOSIS — H35033 Hypertensive retinopathy, bilateral: Secondary | ICD-10-CM | POA: Diagnosis not present

## 2017-07-21 DIAGNOSIS — H43813 Vitreous degeneration, bilateral: Secondary | ICD-10-CM | POA: Diagnosis not present

## 2017-07-21 DIAGNOSIS — E113291 Type 2 diabetes mellitus with mild nonproliferative diabetic retinopathy without macular edema, right eye: Secondary | ICD-10-CM | POA: Diagnosis not present

## 2017-07-21 DIAGNOSIS — E11311 Type 2 diabetes mellitus with unspecified diabetic retinopathy with macular edema: Secondary | ICD-10-CM

## 2017-07-21 DIAGNOSIS — B070 Plantar wart: Secondary | ICD-10-CM

## 2017-07-21 DIAGNOSIS — H353231 Exudative age-related macular degeneration, bilateral, with active choroidal neovascularization: Secondary | ICD-10-CM | POA: Diagnosis not present

## 2017-07-21 DIAGNOSIS — E113212 Type 2 diabetes mellitus with mild nonproliferative diabetic retinopathy with macular edema, left eye: Secondary | ICD-10-CM | POA: Diagnosis not present

## 2017-07-21 DIAGNOSIS — I10 Essential (primary) hypertension: Secondary | ICD-10-CM

## 2017-07-21 NOTE — Progress Notes (Signed)
   Subjective: Patient with a history of diabetes mellitus presents today for follow-up treatment and evaluation regarding a plantar wart to the left foot. Patient states she no longer has pain. He is doing much better. Patient denies any significant events since last visit.  Objective: Physical Exam General: The patient is alert and oriented x3 in no acute distress.  Dermatology: Dry stable callus lesion noted to the plantar aspect of the left foot. Negative for any erythema or drainage.  Vascular: Palpable pedal pulses bilaterally. No edema or erythema noted. Capillary refill within normal limits.  Neurological: Epicritic and protective threshold grossly intact bilaterally.   Musculoskeletal Exam: Pain on palpation to the note skin lesion.  Range of motion within normal limits to all pedal and ankle joints bilateral. Muscle strength 5/5 in all groups bilateral.   Assessment: #1 plantar wart left foot #2 pain in left foot   Plan of Care:  #1 Patient was evaluated. #2 Excisional debridement of the callus lesion was performed using a chisel blade. There is no residual underlying verruca lesion #3 return to clinic when necessary   Edrick Kins, DPM [facility]  Dr. Edrick Kins, [address]                                        [place], [place] [address]                Office [phone]  Fax [phone]

## 2017-07-22 LAB — BASIC METABOLIC PANEL
Creatinine: 0.9 (ref 0.6–1.3)
Glucose: 76
Potassium: 4 (ref 3.4–5.3)
Sodium: 141 (ref 137–147)

## 2017-07-22 LAB — PSA: PSA: 0.01

## 2017-07-22 LAB — TSH: TSH: 1.24 (ref 0.41–5.90)

## 2017-07-22 LAB — LIPID PANEL
Cholesterol: 126 (ref 0–200)
HDL: 62 (ref 35–70)
LDL Cholesterol: 28
Triglycerides: 180 — AB (ref 40–160)

## 2017-07-22 LAB — CBC AND DIFFERENTIAL
HCT: 46 (ref 41–53)
Hemoglobin: 16.2 (ref 13.5–17.5)
Platelets: 258 (ref 150–399)
WBC: 17.1

## 2017-07-30 DIAGNOSIS — Z79899 Other long term (current) drug therapy: Secondary | ICD-10-CM | POA: Diagnosis not present

## 2017-07-30 DIAGNOSIS — G8929 Other chronic pain: Secondary | ICD-10-CM | POA: Diagnosis not present

## 2017-07-30 DIAGNOSIS — Z5181 Encounter for therapeutic drug level monitoring: Secondary | ICD-10-CM | POA: Diagnosis not present

## 2017-07-30 DIAGNOSIS — M545 Low back pain: Secondary | ICD-10-CM | POA: Diagnosis not present

## 2017-07-30 DIAGNOSIS — M47816 Spondylosis without myelopathy or radiculopathy, lumbar region: Secondary | ICD-10-CM | POA: Diagnosis not present

## 2017-07-30 DIAGNOSIS — G894 Chronic pain syndrome: Secondary | ICD-10-CM | POA: Diagnosis not present

## 2017-08-13 ENCOUNTER — Ambulatory Visit (INDEPENDENT_AMBULATORY_CARE_PROVIDER_SITE_OTHER): Payer: Medicare Other | Admitting: *Deleted

## 2017-08-13 DIAGNOSIS — Z23 Encounter for immunization: Secondary | ICD-10-CM

## 2017-08-13 DIAGNOSIS — Z8546 Personal history of malignant neoplasm of prostate: Secondary | ICD-10-CM | POA: Diagnosis not present

## 2017-08-21 ENCOUNTER — Telehealth: Payer: Self-pay

## 2017-08-21 ENCOUNTER — Telehealth: Payer: Self-pay | Admitting: Family Medicine

## 2017-08-21 NOTE — Telephone Encounter (Signed)
Called wife to clarify. She and patient are concerned about recent labs done with the [place]. She did not receive a clear explanation of the results from the [place] and does not understanding them. She would like Dr. Yong Channel and Dr. Alen Blew to review these results and will bring a copy of the results to the office tomorrow. I also recommended she call the [place] and ask them to explain the results to her and she verbalized understanding. She is aware that Dr. Yong Channel is out of the office tomorrow and Monday.

## 2017-08-21 NOTE — Telephone Encounter (Signed)
Dr Colen Darling requested last OV note and labs. The notes and labs from dr Alen Blew on 5/29 were routed to dr Romero Liner.

## 2017-08-21 NOTE — Telephone Encounter (Signed)
Wife called concerned that the [place] has called them this am and states his blood work is elevated. They also wanted to know his cancer dr and their information.  Wife states pt is getting anxious and worrying.  She would like if Dr hunter would go over pt's labs and let them know if all is ok.  Wife would prefer you to call her back on her cell so pt will not think anything is wrong.

## 2017-08-22 ENCOUNTER — Encounter: Payer: Self-pay | Admitting: *Deleted

## 2017-08-25 ENCOUNTER — Encounter: Payer: Self-pay | Admitting: Family Medicine

## 2017-08-25 DIAGNOSIS — N393 Stress incontinence (female) (male): Secondary | ICD-10-CM | POA: Diagnosis not present

## 2017-08-25 DIAGNOSIS — Z8546 Personal history of malignant neoplasm of prostate: Secondary | ICD-10-CM | POA: Diagnosis not present

## 2017-08-25 DIAGNOSIS — N5231 Erectile dysfunction following radical prostatectomy: Secondary | ICD-10-CM | POA: Diagnosis not present

## 2017-08-26 ENCOUNTER — Encounter: Payer: Self-pay | Admitting: Family Medicine

## 2017-08-28 ENCOUNTER — Encounter (INDEPENDENT_AMBULATORY_CARE_PROVIDER_SITE_OTHER): Payer: Medicare Other | Admitting: Ophthalmology

## 2017-08-28 DIAGNOSIS — H35033 Hypertensive retinopathy, bilateral: Secondary | ICD-10-CM | POA: Diagnosis not present

## 2017-08-28 DIAGNOSIS — E11319 Type 2 diabetes mellitus with unspecified diabetic retinopathy without macular edema: Secondary | ICD-10-CM | POA: Diagnosis not present

## 2017-08-28 DIAGNOSIS — E113392 Type 2 diabetes mellitus with moderate nonproliferative diabetic retinopathy without macular edema, left eye: Secondary | ICD-10-CM

## 2017-08-28 DIAGNOSIS — I10 Essential (primary) hypertension: Secondary | ICD-10-CM | POA: Diagnosis not present

## 2017-08-28 DIAGNOSIS — H353231 Exudative age-related macular degeneration, bilateral, with active choroidal neovascularization: Secondary | ICD-10-CM

## 2017-08-28 DIAGNOSIS — E113291 Type 2 diabetes mellitus with mild nonproliferative diabetic retinopathy without macular edema, right eye: Secondary | ICD-10-CM

## 2017-08-28 DIAGNOSIS — H43813 Vitreous degeneration, bilateral: Secondary | ICD-10-CM | POA: Diagnosis not present

## 2017-09-29 ENCOUNTER — Encounter (INDEPENDENT_AMBULATORY_CARE_PROVIDER_SITE_OTHER): Payer: Medicare Other | Admitting: Ophthalmology

## 2017-10-01 ENCOUNTER — Encounter (INDEPENDENT_AMBULATORY_CARE_PROVIDER_SITE_OTHER): Payer: Medicare Other | Admitting: Ophthalmology

## 2017-10-03 ENCOUNTER — Telehealth: Payer: Self-pay | Admitting: Family Medicine

## 2017-10-03 NOTE — Telephone Encounter (Signed)
Left message on wife's personal voicemail that pt is up to date on vaccines at present.

## 2017-10-03 NOTE — Telephone Encounter (Signed)
Patient's wife calling for documentation of vaccines, so patient knows if he needs any additional vaccines.  Ty,  -LL

## 2017-10-06 ENCOUNTER — Encounter (HOSPITAL_COMMUNITY): Payer: Self-pay

## 2017-10-06 ENCOUNTER — Other Ambulatory Visit: Payer: Self-pay | Admitting: Dermatology

## 2017-10-06 DIAGNOSIS — D229 Melanocytic nevi, unspecified: Secondary | ICD-10-CM | POA: Diagnosis not present

## 2017-10-06 DIAGNOSIS — D2339 Other benign neoplasm of skin of other parts of face: Secondary | ICD-10-CM | POA: Diagnosis not present

## 2017-10-06 DIAGNOSIS — L219 Seborrheic dermatitis, unspecified: Secondary | ICD-10-CM | POA: Diagnosis not present

## 2017-10-06 DIAGNOSIS — D492 Neoplasm of unspecified behavior of bone, soft tissue, and skin: Secondary | ICD-10-CM | POA: Diagnosis not present

## 2017-10-06 DIAGNOSIS — L820 Inflamed seborrheic keratosis: Secondary | ICD-10-CM | POA: Diagnosis not present

## 2017-10-16 DIAGNOSIS — R05 Cough: Secondary | ICD-10-CM | POA: Diagnosis not present

## 2017-10-16 DIAGNOSIS — J40 Bronchitis, not specified as acute or chronic: Secondary | ICD-10-CM | POA: Diagnosis not present

## 2017-10-30 ENCOUNTER — Ambulatory Visit (HOSPITAL_BASED_OUTPATIENT_CLINIC_OR_DEPARTMENT_OTHER): Payer: Medicare Other | Admitting: Oncology

## 2017-10-30 ENCOUNTER — Telehealth: Payer: Self-pay | Admitting: Oncology

## 2017-10-30 ENCOUNTER — Other Ambulatory Visit (HOSPITAL_BASED_OUTPATIENT_CLINIC_OR_DEPARTMENT_OTHER): Payer: Medicare Other

## 2017-10-30 VITALS — BP 130/63 | HR 56 | Temp 98.4°F | Resp 18 | Ht 69.0 in | Wt 262.1 lb

## 2017-10-30 DIAGNOSIS — Z8546 Personal history of malignant neoplasm of prostate: Secondary | ICD-10-CM

## 2017-10-30 DIAGNOSIS — C9110 Chronic lymphocytic leukemia of B-cell type not having achieved remission: Secondary | ICD-10-CM

## 2017-10-30 LAB — COMPREHENSIVE METABOLIC PANEL
ALT: 37 U/L (ref 0–55)
AST: 37 U/L — ABNORMAL HIGH (ref 5–34)
Albumin: 3.9 g/dL (ref 3.5–5.0)
Alkaline Phosphatase: 53 U/L (ref 40–150)
Anion Gap: 9 mEq/L (ref 3–11)
BUN: 13.3 mg/dL (ref 7.0–26.0)
CO2: 28 mEq/L (ref 22–29)
Calcium: 9.5 mg/dL (ref 8.4–10.4)
Chloride: 105 mEq/L (ref 98–109)
Creatinine: 1.2 mg/dL (ref 0.7–1.3)
EGFR: 60 mL/min/{1.73_m2} — ABNORMAL LOW (ref >60)
Glucose: 186 mg/dl — ABNORMAL HIGH (ref 70–140)
Potassium: 3.7 mEq/L (ref 3.5–5.1)
Sodium: 142 mEq/L (ref 136–145)
Total Bilirubin: 1.23 mg/dL — ABNORMAL HIGH (ref 0.20–1.20)
Total Protein: 6.8 g/dL (ref 6.4–8.3)

## 2017-10-30 LAB — CBC WITH DIFFERENTIAL/PLATELET
BASO%: 0.4 % (ref 0.0–2.0)
Basophils Absolute: 0 10*3/uL (ref 0.0–0.1)
EOS%: 3.4 % (ref 0.0–7.0)
Eosinophils Absolute: 0.4 10*3/uL (ref 0.0–0.5)
HCT: 44.4 % (ref 38.4–49.9)
HGB: 14.7 g/dL (ref 13.0–17.1)
LYMPH%: 39.1 % (ref 14.0–49.0)
MCH: 27.9 pg (ref 27.2–33.4)
MCHC: 33.2 g/dL (ref 32.0–36.0)
MCV: 84.1 fL (ref 79.3–98.0)
MONO#: 0.9 10*3/uL (ref 0.1–0.9)
MONO%: 6.9 % (ref 0.0–14.0)
NEUT#: 6.2 10*3/uL (ref 1.5–6.5)
NEUT%: 50.2 % (ref 39.0–75.0)
Platelets: 254 10*3/uL (ref 140–400)
RBC: 5.27 10*6/uL (ref 4.20–5.82)
RDW: 14.4 % (ref 11.0–14.6)
WBC: 12.4 10*3/uL — ABNORMAL HIGH (ref 4.0–10.3)
lymph#: 4.8 10*3/uL — ABNORMAL HIGH (ref 0.9–3.3)

## 2017-10-30 NOTE — Telephone Encounter (Signed)
Gave avs and calendar for May 2019 °

## 2017-10-30 NOTE — Progress Notes (Signed)
Hematology and Oncology Follow Up Visit  [name] [id number] 28-Dec-1940 76 y.o. 10/30/2017 9:07 AM [name], MDHunter, [name], MD   Principle Diagnosis: 76 year old gentleman with: 1. Prostate cancer diagnosed in February 2015 he had a Gleason score 3+4 = 7 clinical stage TIc and a PSA of 6.8.  2. 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   Prior Therapy: Sstatus post robotic prostatectomy in February 2015 with the pathology revealed a Gleason score 3+4 = 7 without any evidence of extraprostatic extension.  Current therapy: Observation and surveillance.  Interim History:  [name] presents today for a follow-up visit with his wife. Since the last visit, he continues to do well without any changes in his health.  He still ambulating without any falls or syncope. He denied any fevers, chills or night sweats. He denied any abdominal pain or early satiety. His appetite remain excellent without any changes.  His performance status and quality of life not dramatically changed.   Does not report any chest pain or palpitation. He does not report any headaches, blurry vision or syncope. He does not report any nausea, vomiting, change in his bowel habits. He does not report any hematuria, dysuria. He does not report any skeletal complaints of arthralgias or myalgias. Rest of his review of systems unremarkable.  Medications: I have reviewed the patient's current medications.  Current Outpatient Medications  Medication Sig Dispense Refill  . amLODipine (NORVASC) 10 MG tablet Take 10 mg by mouth every morning.    . hydrochlorothiazide (HYDRODIURIL) 25 MG tablet Take 25 mg by mouth daily.    . insulin aspart (NOVOLOG) 100 UNIT/ML injection Inject 45 Units into the skin 3 (three) times daily with meals. When sugar is elevated    . insulin glargine (LANTUS) 100 UNIT/ML injection Inject 102 Units into the skin at bedtime.     . lansoprazole  (PREVACID) 30 MG capsule Take 30 mg by mouth daily at 12 noon.    Marland Kitchen levothyroxine (SYNTHROID, LEVOTHROID) 50 MCG tablet Take 50 mcg by mouth daily before breakfast.     . losartan (COZAAR) 100 MG tablet Take 100 mg by mouth daily.    . mirtazapine (REMERON) 15 MG tablet Take 15 mg by mouth at bedtime.    . ONE TOUCH ULTRA TEST test strip Test 4 times daily. Dx: E11.9 100 each 11  . potassium chloride SA (K-DUR,KLOR-CON) 20 MEQ tablet Take 20 mEq by mouth daily.    . propranolol (INDERAL) 40 MG tablet Take 1 tablet (40 mg total) by mouth 2 (two) times daily. 180 tablet 3  . simvastatin (ZOCOR) 40 MG tablet Take 40 mg by mouth daily. [facility]- dose unclear but taking 1/2 tablet    . sucralfate (CARAFATE) 1 G tablet Take 1 g by mouth 4 (four) times daily -  with meals and at bedtime.     No current facility-administered medications for this visit.      Allergies:  Allergies  Allergen Reactions  . Morphine Sulfate Itching and Rash    Past Medical History, Surgical history, Social history, and Family History were reviewed and updated.   Blood pressure 130/63, pulse (!) 56, temperature 98.4 F (36.9 C), temperature source Oral, resp. rate 18, height 5\' 9"  (1.753 m), weight 262 lb 1.6 oz (118.9 kg), SpO2 98 %. ECOG: 1 General appearance: Well-appearing gentleman without distress. Head: Normocephalic, without obvious abnormality no oral ulcers or thrush. Neck: no adenopathy  Lymph nodes: Cervical, supraclavicular,  and axillary nodes normal. Heart:regular rate and rhythm, S1, S2 normal, no murmur, click, rub or gallop Lung:chest clear, no wheezing, rales, normal symmetric air entry. Abdomin: soft, non-tender, without masses or organomegaly no shifting dullness or ascites. EXT:no erythema, induration, or nodules   Lab Results: Lab Results  Component Value Date   WBC 12.4 (H) 10/30/2017   HGB 14.7 10/30/2017   HCT 44.4 10/30/2017   MCV 84.1 10/30/2017   PLT 254 10/30/2017      Chemistry      Component Value Date/Time   NA 141 07/22/2017   NA 141 04/29/2017 0909   K 4.0 07/22/2017   K 3.7 04/29/2017 0909   CL 104 07/16/2017 0833   CO2 29 07/16/2017 0833   CO2 26 04/29/2017 0909   BUN 16 07/16/2017 0833   BUN 11.5 04/29/2017 0909   CREATININE 0.9 07/22/2017   CREATININE 0.97 07/16/2017 0833   CREATININE 1.0 04/29/2017 0909   GLU 76 07/22/2017      Component Value Date/Time   CALCIUM 9.0 07/16/2017 0833   CALCIUM 9.1 04/29/2017 0909   ALKPHOS 44 07/16/2017 0833   ALKPHOS 53 04/29/2017 0909   AST 21 07/16/2017 0833   AST 38 (H) 04/29/2017 0909   ALT 26 07/16/2017 0833   ALT 41 04/29/2017 0909   BILITOT 0.9 07/16/2017 0833   BILITOT 1.40 (H) 04/29/2017 0909         Impression and Plan:  76 year old gentleman with the following issues:  1. Prostate cancer diagnosed in February 2015. He presented with a PSA of 6.8 and status post robotic prostatectomy.   He is currently under active surveillance without any evidence of recurrent disease.  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 He remained asymptomatic at this time and does not require treatment.  Laboratory data from 10/30/2017 continues to show mild elevation in white cell count but no other abnormalities.  I see no indication for treatment at this time and will continue with observation and surveillance.   3. Follow-up: Will be in 6 months.     Zola Button, MD 11/29/20189:07 AM

## 2017-11-06 ENCOUNTER — Encounter (INDEPENDENT_AMBULATORY_CARE_PROVIDER_SITE_OTHER): Payer: Medicare Other | Admitting: Ophthalmology

## 2017-11-06 DIAGNOSIS — H43813 Vitreous degeneration, bilateral: Secondary | ICD-10-CM

## 2017-11-06 DIAGNOSIS — E11311 Type 2 diabetes mellitus with unspecified diabetic retinopathy with macular edema: Secondary | ICD-10-CM

## 2017-11-06 DIAGNOSIS — H35033 Hypertensive retinopathy, bilateral: Secondary | ICD-10-CM

## 2017-11-06 DIAGNOSIS — H353231 Exudative age-related macular degeneration, bilateral, with active choroidal neovascularization: Secondary | ICD-10-CM | POA: Diagnosis not present

## 2017-11-06 DIAGNOSIS — E113292 Type 2 diabetes mellitus with mild nonproliferative diabetic retinopathy without macular edema, left eye: Secondary | ICD-10-CM

## 2017-11-06 DIAGNOSIS — I10 Essential (primary) hypertension: Secondary | ICD-10-CM | POA: Diagnosis not present

## 2017-11-06 DIAGNOSIS — E113211 Type 2 diabetes mellitus with mild nonproliferative diabetic retinopathy with macular edema, right eye: Secondary | ICD-10-CM

## 2017-11-11 ENCOUNTER — Ambulatory Visit: Payer: Medicare Other | Admitting: Family Medicine

## 2017-11-11 ENCOUNTER — Ambulatory Visit: Payer: Medicare Other | Admitting: *Deleted

## 2017-12-03 ENCOUNTER — Ambulatory Visit: Payer: Medicare Other | Admitting: Family Medicine

## 2017-12-05 DIAGNOSIS — G8929 Other chronic pain: Secondary | ICD-10-CM | POA: Diagnosis not present

## 2017-12-05 DIAGNOSIS — M47816 Spondylosis without myelopathy or radiculopathy, lumbar region: Secondary | ICD-10-CM | POA: Diagnosis not present

## 2017-12-05 DIAGNOSIS — M545 Low back pain: Secondary | ICD-10-CM | POA: Diagnosis not present

## 2017-12-05 DIAGNOSIS — G894 Chronic pain syndrome: Secondary | ICD-10-CM | POA: Diagnosis not present

## 2017-12-10 ENCOUNTER — Encounter: Payer: Self-pay | Admitting: Family Medicine

## 2017-12-17 NOTE — Progress Notes (Signed)
Subjective:   [name] is a 77 y.o. male who presents for an Initial Medicare Annual Wellness Visit.  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 Yong Channel  Declined for today at our visit      There are no preventive care reminders to display for this patient.   Colonoscopy 01/2017    Objective:    Today's Vitals   12/18/17 1007  BP: (!) 142/50  Pulse: 85  SpO2: 95%  Weight: 261 lb (118.4 kg)  Height: 6' (1.829 m)   Body mass index is 35.4 kg/m.  Advanced Directives 12/18/2017 10/29/2016 04/09/2016 10/10/2015 04/04/2015 10/12/2014 10/04/2014  Does Patient Have a Medical Advance Directive? Yes _0  No  Type of Advance Directive - - - - - - -  Does patient want to make changes to medical advance directive? - - - - - - Yes - information given  Would patient like information on creating a medical advance directive? - No - Patient declined No - patient declined information Yes - Educational materials given No - patient declined information No - patient declined information -  Pre-existing out of facility DNR order (yellow form or pink MOST form) - - - - - - -    Current Medications  (verified) Outpatient Encounter Medications as of 12/18/2017  Medication Sig  . amLODipine (NORVASC) 10 MG tablet Take 10 mg by mouth every morning.  . hydrochlorothiazide (HYDRODIURIL) 25 MG tablet Take 25 mg by mouth daily.  . insulin aspart (NOVOLOG) 100 UNIT/ML injection Inject 45 Units into the skin 3 (three) times daily with meals. When sugar is elevated  . insulin glargine (LANTUS) 100 UNIT/ML injection Inject 102 Units into the skin at bedtime.   . lansoprazole (PREVACID) 30 MG capsule Take 30 mg by mouth daily at 12 noon.  Marland Kitchen levothyroxine (SYNTHROID, LEVOTHROID) 50 MCG tablet Take 50 mcg by mouth daily before breakfast.   . losartan (COZAAR) 100 MG tablet Take 100 mg by mouth daily.  . meclizine (ANTIVERT) 25 MG tablet Take 25 mg by mouth every 6 (six) hours.  . mirtazapine (REMERON) 15 MG tablet Take 15 mg by mouth at bedtime.  . ONE TOUCH ULTRA TEST test strip Test 4 times daily. Dx: E11.9  . potassium chloride SA (K-DUR,KLOR-CON) 20 MEQ tablet Take 20 mEq by mouth daily.  . propranolol (INDERAL) 40 MG tablet Take 1 tablet (40 mg total) by mouth 2 (two) times daily.  . simvastatin (ZOCOR) 40 MG tablet Take 40 mg by mouth daily. [facility]- dose  unclear but taking 1/2 tablet  . sucralfate (CARAFATE) 1 G tablet Take 1 g by mouth 4 (four) times daily -  with meals and at bedtime.   No facility-administered encounter medications on file as of 12/18/2017.     Allergies (verified) Morphine sulfate   History: Past Medical History:  Diagnosis Date  . Arthritis   . Colon polyps   . DDD (degenerative disc disease), lumbar   . DDD (degenerative disc disease), lumbar    last lumbar steroid injection 11/24/13  . Depression    PTSD  . Diabetes mellitus   . Diabetic retinopathy   . Diverticulosis of colon   . Elevated PSA   . ERECTILE DYSFUNCTION 12/30/2007   No rx.     Marland Kitchen GERD (gastroesophageal reflux disease)   . Hyperlipidemia   . Hypertension   . Hypothyroidism   . IBS  (irritable bowel syndrome)   . Leukemia ([place]) 01/2014  . Macular degeneration    followed by ophthalmology  . OSA (obstructive sleep apnea)   . PONV (postoperative nausea and vomiting)   . Prostate cancer ([place])   . PTSD (post-traumatic stress disorder)    [facility]   Past Surgical History:  Procedure Laterality Date  . CHOLECYSTECTOMY    . COLONOSCOPY  03/25/12, 09/28/08  . ESOPHAGOGASTRODUODENOSCOPY N/A 10/12/2014   Procedure: ESOPHAGOGASTRODUODENOSCOPY (EGD);  Surgeon: Gatha Mayer, MD;  Location: [facility];  Service: Endoscopy;  Laterality: N/A;  . ESOPHAGOGASTRODUODENOSCOPY ENDOSCOPY  09/28/08  . FOOT SURGERY Bilateral   . HEMORRHOID SURGERY    . LAPAROSCOPIC GASTRIC BANDING  03/05/11   weight loss  . LAPAROSCOPIC GASTRIC BANDING WITH HIATAL HERNIA REPAIR  03/05/2011  . LYMPHADENECTOMY Bilateral 01/24/2014   Procedure: LYMPHADENECTOMY;  Surgeon: Dutch Gray, MD;  Location: [facility];  Service: Urology;  Laterality: Bilateral;  . ORIF TIBIA FRACTURE Right   . PENILE PROSTHESIS IMPLANT    . PROSTATE SURGERY  01/2014  . ROBOT ASSISTED LAPAROSCOPIC RADICAL PROSTATECTOMY N/A 01/24/2014   Procedure: ROBOTIC ASSISTED LAPAROSCOPIC RADICAL PROSTATECTOMY LEVEL 3;  Surgeon: Dutch Gray, MD;  Location: [facility];  Service: Urology;  Laterality: N/A;  . SHOULDER SURGERY Right 2011  . TONSILLECTOMY     age 28  . VASECTOMY     Family History  Problem Relation Age of Onset  . Hypertension Mother   . Stomach cancer Mother 86  . Alcohol abuse Brother   . Hypertension Paternal Uncle   . Heart attack Paternal Uncle   . Colon cancer Neg Hx   . Colon polyps Neg Hx   . Rectal cancer Neg Hx    Social History   Socioeconomic History  . Marital status: Married    Spouse name: Not on file  . Number of children: 5  . Years of education: Not on file  . Highest education level: Not on file  Social Needs  . Financial resource strain: Not on file  . Food insecurity - worry: Not on file  . Food  insecurity - inability: Not on file  . Transportation needs - medical: Not on file  . Transportation needs - non-medical: Not on file  Occupational History  . Occupation: retired    Fish farm manager: RETIRED  Tobacco Use  . Smoking status: Former Smoker    Packs/day: 1.00    Years: 20.00    Pack years: 20.00    Types: Cigarettes    Last attempt to quit: 02/25/1981    Years since quitting: 36.8  . Smokeless tobacco:  Never Used  Substance and Sexual Activity  . Alcohol use: No    Alcohol/week: 0.0 oz    Comment: stopped drinking 76 or 77   . Drug use: No  . Sexual activity: Yes    Comment: Been able to utilize his penile prosthesis successfully  Other Topics Concern  . Not on file  Social History Narrative   Married. 3 children from previous marriage, 2 step children. 15 grandkids.       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 Y  Vision? N  Difficulty concentrating or making decisions? N  Some recent data might be hidden     Immunizations and Health Maintenance Immunization History  Administered Date(s) Administered  . Hep A / Hep B 07/29/2011, 01/27/2012  . Hepatitis B 08/26/2011  . Influenza Split 09/18/2012  . Influenza Whole 09/22/2007, 09/01/2009, 08/31/2010  . Influenza, High Dose Seasonal PF 08/13/2017  . Influenza,inj,Quad PF,6+ Mos 08/11/2013, 08/31/2014, 08/11/2015  . Influenza-Unspecified 08/20/2016  . Pneumococcal Conjugate-13 09/06/2014  . Pneumococcal Polysaccharide-23 12/02/2008  . Td 05/07/2007  . Tdap 08/31/2014  . Zoster 12/03/2007   There are no preventive care reminders to display for this patient.  Patient Care Team: [name], MD as PCP - General (Family  Medicine)  Indicate any recent Medical Services you may have received from other than [facility] providers in the past year (date may be approximate).    Assessment:   This is a routine wellness examination for [name].  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 Zigmund Daniel giving him shots in his eyes q 6 weeks;  Right eye is worse  Restrictions to his driving  Diabetic eye exam 12/2016  Dietary issues and exercise activities discussed:    Goals    . Patient Stated     Try to walk some or get up and go some  Keep taking your Cruises and getting out        Depression Screen PHQ 2/9 Scores 12/18/2017 09/19/2016 08/11/2015 04/26/2014  PHQ - 2 Score 0 0 0 0    Fall Risk Fall Risk  12/18/2017 04/25/2017 09/19/2016 08/11/2015 04/26/2014  Falls in the past year? Yes Yes Yes No No  Comment with dizziness  - - - -  Number falls in past yr: - 1 1 - -  Injury with Fall? No No No - -  Risk for fall due to : Impaired balance/gait;Other (Comment) Other (Comment) - - -  Follow up - Falls evaluation completed;Education provided;Falls prevention discussed - - -   Has scooter on the back of his truck when traveling Uses a walker Home is one level  [place] with vertigo   Sold most of his guns Keeps a few around - they are in a safe place    Cognitive Function: Ad8 score reviewed for issues:  Issues making decisions:  Less interest in hobbies / activities:  Repeats questions, stories (family complaining):  Trouble using ordinary gadgets (microwave, computer, phone):  Forgets the month or year:   Mismanaging finances:   Remembering appts:  Daily problems  with thinking and/or memory: Ad8 score is= 0 Sometimes misses words       6CIT Screen 12/18/2017  What Year? 0 points  What month? 0 points  What time? 0 points   Count back from 20 0 points  Months in reverse 0 points  Repeat phrase 0 points  Total Score 0    Screening Tests Health Maintenance  Topic Date Due  . OPHTHALMOLOGY EXAM  12/31/2017  . HEMOGLOBIN A1C  01/16/2018  . FOOT EXAM  07/16/2018  . COLONOSCOPY  02/26/2022  . TETANUS/TDAP  08/31/2024  . INFLUENZA VACCINE  Completed  . PNA vac Low Risk Adult  Completed          Plan:      PCP Notes   Health Maintenance Educated regarding shingrix  Plans to take the shingrix at the [place] where he gets his meds    Abnormal Screens  BMI 35; no motivation to lose  Does not exercise due to intolerance and fatigue    Referrals  none  Patient concerns; Been having "vertigo" per the doctor at [place] yesterday; Occurs if he gets up slow; to discuss with Dr. Ananias Pilgrim he had steroid shot to his back x 2 weeks ago  [facility]   [facility] is [facility] low- to discuss with Dr. [facility] gave him a rx for pain but has not taken any meds  Nurse Concerns; As noted  Next PCP apt today      I have personally reviewed and noted the following in the patient's chart:   . Medical and social history . Use of alcohol, tobacco or illicit drugs  . Current medications and supplements . Functional ability and status . Nutritional status . Physical activity . Advanced directives . List of other physicians . Hospitalizations, surgeries, and ER visits in previous 12 months . Vitals . Screenings to include cognitive, depression, and falls . Referrals and appointments  In addition, I have reviewed and discussed with patient certain preventive protocols, quality metrics, and best practice recommendations. A written personalized care plan for preventive services as well as general preventive health recommendations were provided to patient.     Wynetta Fines, RN   12/18/2017

## 2017-12-18 ENCOUNTER — Encounter: Payer: Self-pay | Admitting: *Deleted

## 2017-12-18 ENCOUNTER — Ambulatory Visit (INDEPENDENT_AMBULATORY_CARE_PROVIDER_SITE_OTHER): Payer: Medicare Other | Admitting: Family Medicine

## 2017-12-18 ENCOUNTER — Encounter: Payer: Self-pay | Admitting: Family Medicine

## 2017-12-18 ENCOUNTER — Ambulatory Visit (INDEPENDENT_AMBULATORY_CARE_PROVIDER_SITE_OTHER): Payer: Medicare Other | Admitting: *Deleted

## 2017-12-18 VITALS — BP 128/70 | HR 85 | Temp 98.2°F | Ht 72.0 in | Wt 261.0 lb

## 2017-12-18 VITALS — BP 142/50 | HR 85 | Ht 72.0 in | Wt 261.0 lb

## 2017-12-18 DIAGNOSIS — Z794 Long term (current) use of insulin: Secondary | ICD-10-CM | POA: Diagnosis not present

## 2017-12-18 DIAGNOSIS — C9110 Chronic lymphocytic leukemia of B-cell type not having achieved remission: Secondary | ICD-10-CM

## 2017-12-18 DIAGNOSIS — Z0000 Encounter for general adult medical examination without abnormal findings: Secondary | ICD-10-CM | POA: Diagnosis not present

## 2017-12-18 DIAGNOSIS — E782 Mixed hyperlipidemia: Secondary | ICD-10-CM | POA: Diagnosis not present

## 2017-12-18 DIAGNOSIS — Z87891 Personal history of nicotine dependence: Secondary | ICD-10-CM | POA: Diagnosis not present

## 2017-12-18 DIAGNOSIS — E11319 Type 2 diabetes mellitus with unspecified diabetic retinopathy without macular edema: Secondary | ICD-10-CM

## 2017-12-18 DIAGNOSIS — C9190 Lymphoid leukemia, unspecified not having achieved remission: Secondary | ICD-10-CM | POA: Diagnosis not present

## 2017-12-18 DIAGNOSIS — I10 Essential (primary) hypertension: Secondary | ICD-10-CM

## 2017-12-18 DIAGNOSIS — E034 Atrophy of thyroid (acquired): Secondary | ICD-10-CM

## 2017-12-18 LAB — COMPREHENSIVE METABOLIC PANEL
ALT: 39 U/L (ref 0–53)
AST: 35 U/L (ref 0–37)
Albumin: 4.5 g/dL (ref 3.5–5.2)
Alkaline Phosphatase: 60 U/L (ref 39–117)
BUN: 14 mg/dL (ref 6–23)
CO2: 27 mEq/L (ref 19–32)
Calcium: 9.5 mg/dL (ref 8.4–10.5)
Chloride: 100 mEq/L (ref 96–112)
Creatinine, Ser: 0.84 mg/dL (ref 0.40–1.50)
GFR: 94.85 mL/min (ref >60.00)
Glucose, Bld: 141 mg/dL — ABNORMAL HIGH (ref 70–99)
Potassium: 3.5 mEq/L (ref 3.5–5.1)
Sodium: 140 mEq/L (ref 135–145)
Total Bilirubin: 1.5 mg/dL — ABNORMAL HIGH (ref 0.2–1.2)
Total Protein: 6.9 g/dL (ref 6.0–8.3)

## 2017-12-18 LAB — CBC WITH DIFFERENTIAL/PLATELET
Basophils Absolute: 0.1 10*3/uL (ref 0.0–0.1)
Basophils Relative: 0.5 % (ref 0.0–3.0)
Eosinophils Absolute: 0.3 10*3/uL (ref 0.0–0.7)
Eosinophils Relative: 1.6 % (ref 0.0–5.0)
HCT: 49.6 % (ref 39.0–52.0)
Hemoglobin: 16.3 g/dL (ref 13.0–17.0)
Lymphocytes Relative: 32.5 % (ref 12.0–46.0)
Lymphs Abs: 5.2 10*3/uL — ABNORMAL HIGH (ref 0.7–4.0)
MCHC: 32.8 g/dL (ref 30.0–36.0)
MCV: 86.2 fl (ref 78.0–100.0)
Monocytes Absolute: 1.2 10*3/uL — ABNORMAL HIGH (ref 0.1–1.0)
Monocytes Relative: 7.7 % (ref 3.0–12.0)
Neutro Abs: 9.2 10*3/uL — ABNORMAL HIGH (ref 1.4–7.7)
Neutrophils Relative %: 57.7 % (ref 43.0–77.0)
Platelets: 283 10*3/uL (ref 150.0–400.0)
RBC: 5.75 Mil/uL (ref 4.22–5.81)
RDW: 15.2 % (ref 11.5–15.5)
WBC: 16 10*3/uL — ABNORMAL HIGH (ref 4.0–10.5)

## 2017-12-18 LAB — HEMOGLOBIN A1C: Hgb A1c MFr Bld: 7.4 % — ABNORMAL HIGH (ref 4.6–6.5)

## 2017-12-18 LAB — TSH: TSH: 1.45 u[IU]/mL (ref 0.35–4.50)

## 2017-12-18 NOTE — Patient Instructions (Addendum)
Please stop by lab before you go  We will call you within a week or two about your referral to Aneurysm screening. If you do not hear within 3 weeks, give [place] a call.

## 2017-12-18 NOTE — Patient Instructions (Addendum)
[name] , Thank you for taking time to come for your Medicare Wellness Visit. I appreciate your ongoing commitment to your health goals. Please review the following plan we discussed and let me know if I can assist you in the future.   You can talk to Dr. Yong Channel regarding having an AAA due to smoking hx  Shingrix is a vaccine for the prevention of Shingles in Adults 39 and older.  If you are on Medicare, you can request a prescription from your doctor to be filled at a pharmacy.  Please check with your benefits regarding applicable copays or out of pocket expenses.  The Shingrix is given in 2 vaccines approx 8 weeks apart. You must receive the 2nd dose prior to 6 months from receipt of the first.   Will get these at the [place] as the [place] covers your medicine   These are the goals we discussed: Goals    . Patient Stated     Try to walk some or get up and go some  Keep taking your Cruises and getting out         This is a list of the screening recommended for you and due dates:  Health Maintenance  Topic Date Due  . Eye exam for diabetics  12/31/2017  . Hemoglobin A1C  01/16/2018  . Complete foot exam   07/16/2018  . Colon Cancer Screening  02/26/2022  . Tetanus Vaccine  08/31/2024  . Flu Shot  Completed  . Pneumonia vaccines  Completed      Fall Prevention in the Home Falls can cause injuries. They can happen to people of all ages. There are many things you can do to make your home safe and to help prevent falls. What can I do on the outside of my home?  Regularly fix the edges of walkways and driveways and fix any cracks.  Remove anything that might make you trip as you walk through a door, such as a raised step or threshold.  Trim any bushes or trees on the path to your home.  Use bright outdoor lighting.  Clear any walking paths of anything that might make someone trip, such as rocks or tools.  Regularly check to see if handrails are loose or broken. Make sure that  both sides of any steps have handrails.  Any raised decks and porches should have guardrails on the edges.  Have any leaves, snow, or ice cleared regularly.  Use sand or salt on walking paths during winter.  Clean up any spills in your garage right away. This includes oil or grease spills. What can I do in the bathroom?  Use night lights.  Install grab bars by the toilet and in the tub and shower. Do not use towel bars as grab bars.  Use non-skid mats or decals in the tub or shower.  If you need to sit down in the shower, use a plastic, non-slip stool.  Keep the floor dry. Clean up any water that spills on the floor as soon as it happens.  Remove soap buildup in the tub or shower regularly.  Attach bath mats securely with double-sided non-slip rug tape.  Do not have throw rugs and other things on the floor that can make you trip. What can I do in the bedroom?  Use night lights.  Make sure that you have a light by your bed that is easy to reach.  Do not use any sheets or blankets that are too big  for your bed. They should not hang down onto the floor.  Have a firm chair that has side arms. You can use this for support while you get dressed.  Do not have throw rugs and other things on the floor that can make you trip. What can I do in the kitchen?  Clean up any spills right away.  Avoid walking on wet floors.  Keep items that you use a lot in easy-to-reach places.  If you need to reach something above you, use a strong step stool that has a grab bar.  Keep electrical cords out of the way.  Do not use floor polish or wax that makes floors slippery. If you must use wax, use non-skid floor wax.  Do not have throw rugs and other things on the floor that can make you trip. What can I do with my stairs?  Do not leave any items on the stairs.  Make sure that there are handrails on both sides of the stairs and use them. Fix handrails that are broken or loose. Make sure  that handrails are as long as the stairways.  Check any carpeting to make sure that it is firmly attached to the stairs. Fix any carpet that is loose or worn.  Avoid having throw rugs at the top or bottom of the stairs. If you do have throw rugs, attach them to the floor with carpet tape.  Make sure that you have a light switch at the top of the stairs and the bottom of the stairs. If you do not have them, ask someone to add them for you. What else can I do to help prevent falls?  Wear shoes that: ? Do not have high heels. ? Have rubber bottoms. ? Are comfortable and fit you well. ? Are closed at the toe. Do not wear sandals.  If you use a stepladder: ? Make sure that it is fully opened. Do not climb a closed stepladder. ? Make sure that both sides of the stepladder are locked into place. ? Ask someone to hold it for you, if possible.  Clearly mark and make sure that you can see: ? Any grab bars or handrails. ? First and last steps. ? Where the edge of each step is.  Use tools that help you move around (mobility aids) if they are needed. These include: ? Canes. ? Walkers. ? Scooters. ? Crutches.  Turn on the lights when you go into a dark area. Replace any light bulbs as soon as they burn out.  Set up your furniture so you have a clear path. Avoid moving your furniture around.  If any of your floors are uneven, fix them.  If there are any pets around you, be aware of where they are.  Review your medicines with your doctor. Some medicines can make you feel dizzy. This can increase your chance of falling. Ask your doctor what other things that you can do to help prevent falls. This information is not intended to replace advice given to you by your health care provider. Make sure you discuss any questions you have with your health care provider. Document Released: 09/14/2009 Document Revised: 04/25/2016 Document Reviewed: 12/23/2014 Elsevier Interactive Patient Education   2018 [place] Maintenance, Male A healthy lifestyle and preventive care is important for your health and wellness. Ask your health care provider about what schedule of regular examinations is right for you. What should I know about weight and diet? Eat a  Healthy Diet  Eat plenty of vegetables, fruits, whole grains, low-fat dairy products, and lean protein.  Do not eat a lot of foods high in solid fats, added sugars, or salt.  Maintain a Healthy Weight Regular exercise can help you achieve or maintain a healthy weight. You should:  Do at least 150 minutes of exercise each week. The exercise should increase your heart rate and make you sweat (moderate-intensity exercise).  Do strength-training exercises at least twice a week.  Watch Your Levels of Cholesterol and Blood Lipids  Have your blood tested for lipids and cholesterol every 5 years starting at 75 years of age. If you are at high risk for heart disease, you should start having your blood tested when you are 76 years old. You may need to have your cholesterol levels checked more often if: ? Your lipid or cholesterol levels are high. ? You are older than 75 years of age. ? You are at high risk for heart disease.  What should I know about cancer screening? Many types of cancers can be detected early and may often be prevented. Lung Cancer  You should be screened every year for lung cancer if: ? You are a current smoker who has smoked for at least 30 years. ? You are a former smoker who has quit within the past 15 years.  Talk to your health care provider about your screening options, when you should start screening, and how often you should be screened.  Colorectal Cancer  Routine colorectal cancer screening usually begins at 75 years of age and should be repeated every 5-10 years until you are 75 years old. You may need to be screened more often if early forms of precancerous polyps or small growths are found.  Your health care provider may recommend screening at an earlier age if you have risk factors for colon cancer.  Your health care provider may recommend using home test kits to check for hidden blood in the stool.  A small camera at the end of a tube can be used to examine your colon (sigmoidoscopy or colonoscopy). This checks for the earliest forms of colorectal cancer.  Prostate and Testicular Cancer  Depending on your age and overall health, your health care provider may do certain tests to screen for prostate and testicular cancer.  Talk to your health care provider about any symptoms or concerns you have about testicular or prostate cancer.  Skin Cancer  Check your skin from head to toe regularly.  Tell your health care provider about any new moles or changes in moles, especially if: ? There is a change in a mole's size, shape, or color. ? You have a mole that is larger than a pencil eraser.  Always use sunscreen. Apply sunscreen liberally and repeat throughout the day.  Protect yourself by wearing long sleeves, pants, a wide-brimmed hat, and sunglasses when outside.  What should I know about heart disease, diabetes, and high blood pressure?  If you are 35-61 years of age, have your blood pressure checked every 3-5 years. If you are 24 years of age or older, have your blood pressure checked every year. You should have your blood pressure measured twice-once when you are at a hospital or clinic, and once when you are not at a hospital or clinic. Record the average of the two measurements. To check your blood pressure when you are not at a hospital or clinic, you can use: ? An automated blood pressure machine at a pharmacy. ?  A home blood pressure monitor.  Talk to your health care provider about your target blood pressure.  If you are between 30-1 years old, ask your health care provider if you should take aspirin to prevent heart disease.  Have regular diabetes screenings by  checking your fasting blood sugar level. ? If you are at a normal weight and have a low risk for diabetes, have this test once every three years after the age of 28. ? If you are overweight and have a high risk for diabetes, consider being tested at a younger age or more often.  A one-time screening for abdominal aortic aneurysm (AAA) by ultrasound is recommended for men aged 59-75 years who are current or former smokers. What should I know about preventing infection? Hepatitis B If you have a higher risk for hepatitis B, you should be screened for this virus. Talk with your health care provider to find out if you are at risk for hepatitis B infection. Hepatitis C Blood testing is recommended for:  Everyone born from [birth year] through 1963.  Anyone with known risk factors for hepatitis C.  Sexually Transmitted Diseases (STDs)  You should be screened each year for STDs including gonorrhea and chlamydia if: ? You are sexually active and are younger than 75 years of age. ? You are older than 75 years of age and your health care provider tells you that you are at risk for this type of infection. ? Your sexual activity has changed since you were last screened and you are at an increased risk for chlamydia or gonorrhea. Ask your health care provider if you are at risk.  Talk with your health care provider about whether you are at high risk of being infected with HIV. Your health care provider may recommend a prescription medicine to help prevent HIV infection.  What else can I do?  Schedule regular health, dental, and eye exams.  Stay current with your vaccines (immunizations).  Do not use any tobacco products, such as cigarettes, chewing tobacco, and e-cigarettes. If you need help quitting, ask your health care provider.  Limit alcohol intake to no more than 2 drinks per day. One drink equals 12 ounces of beer, 5 ounces of wine, or 1 ounces of hard liquor.  Do not use street drugs.  Do not  share needles.  Ask your health care provider for help if you need support or information about quitting drugs.  Tell your health care provider if you often feel depressed.  Tell your health care provider if you have ever been abused or do not feel safe at home. This information is not intended to replace advice given to you by your health care provider. Make sure you discuss any questions you have with your health care provider. Document Released: 05/16/2008 Document Revised: 07/17/2016 Document Reviewed: 08/22/2015 Elsevier Interactive Patient Education  [employer].

## 2017-12-18 NOTE — Progress Notes (Signed)
I have reviewed and agree with note, evaluation, plan. Patient with me describes dizziness with head movement- room movement such as turning over in bed- meclizine helps some. Minimal symptoms right now. Patient does not want to adjust cholesterol medicine though we discussed this option.   [name], MD

## 2017-12-18 NOTE — Assessment & Plan Note (Signed)
S: controlled on repeat on amlodipine 10mg , hctz 25mg , losartan 100mg . BP at [facility] was not rechecked- on same date of service BP Readings from Last 3 Encounters:  12/18/17 128/70  12/18/17 (!) 142/50  10/30/17 130/63  A/P: We discussed blood pressure goal of <140/90. Continue current med

## 2017-12-18 NOTE — Assessment & Plan Note (Signed)
S: very well  controlled on simvastatin 40mg  (half of 80mg  tablet). No myalgias. He brings in his medicine- we thought he was taking half of 40mg  but actually half of 80mg  Lab Results  Component Value Date   CHOL 126 07/22/2017   HDL 62 07/22/2017   LDLCALC 28 07/22/2017   LDLDIRECT 43.0 12/24/2016   TRIG 180 (A) 07/22/2017   CHOLHDL 2 07/16/2017   A/P: we discussed reduce dose of simvastatin to 20mg - he declines

## 2017-12-18 NOTE — Assessment & Plan Note (Signed)
S:  controlled. On 102 units of lantus and 46 units short acting insulin before meals.  CBGs- up with his steroid injection lately- this morning was 123.  Exercise and diet- back pain limits exercise . Weight down 2 lbs form last visit Lab Results  Component Value Date   HGBA1C 7.4 (H) 07/16/2017   HGBA1C 7.3 04/02/2017   HGBA1C 7.1 (H) 12/24/2016   A/P: a1c 7.4 again today- I believe 7.5 is a reasonable goal for patient- thankful no hypoglycemia

## 2017-12-18 NOTE — Progress Notes (Signed)
Subjective:  [name] is a 77 y.o. year old very pleasant male patient who presents for/with See problem oriented charting ROS- no chest pain. No recent hypoglycemia. No edema. Does have chronic back pain   Past Medical History-  Patient Active Problem List   Diagnosis Date Noted  . Chronic bilateral low back pain without sciatica 12/12/2015    Priority: High  . Chronic lymphocytic leukemia ([place]) 05/16/2014    Priority: High  . History of prostate cancer 01/24/2014    Priority: High  . 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 Diabetic retinopathy ([place]) 09/24/2007    Priority: High  . Tremor 08/11/2015    Priority: Medium  . Hypothyroidism     Priority: Medium  . 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 Depression 09/21/2009    Priority: Medium  . Hyperlipidemia 09/24/2007    Priority: Medium  . OSA on CPAP 09/24/2007    Priority: Medium  . Essential hypertension 09/24/2007    Priority: Medium  . Personal history of skin cancer 04/02/2017    Priority: Low  . GERD (gastroesophageal reflux disease) 12/08/2014    Priority: Low  . Morbid obesity ([place]) 10/25/2008    Priority: Low  . History of colonic polyps 08/22/2008    Priority: Low  . Ear pressure, left 04/14/2017  . Temporomandibular joint (TMJ) pain 04/14/2017  . Pain syndrome, chronic 02/01/2016  . 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 myelopathy or radiculopathy 02/01/2016    Medications- reviewed and updated Current Outpatient Medications  Medication Sig Dispense Refill  . amLODipine (NORVASC) 10 MG tablet Take 10 mg by mouth every morning.    . hydrochlorothiazide (HYDRODIURIL) 25 MG tablet Take 25 mg by mouth daily.    . insulin aspart (NOVOLOG) 100 UNIT/ML injection Inject 45 Units into the skin 3 (three) times daily with meals. When sugar is elevated    . insulin glargine (LANTUS) 100 UNIT/ML injection Inject 102 Units into the skin at  bedtime.     . lansoprazole (PREVACID) 30 MG capsule Take 30 mg by mouth daily at 12 noon.    Marland Kitchen levothyroxine (SYNTHROID, LEVOTHROID) 50 MCG tablet Take 50 mcg by mouth daily before breakfast.     . losartan (COZAAR) 100 MG tablet Take 100 mg by mouth daily.    . meclizine (ANTIVERT) 25 MG tablet Take 25 mg by mouth every 6 (six) hours.    . mirtazapine (REMERON) 15 MG tablet Take 15 mg by mouth at bedtime.    . ONE TOUCH ULTRA TEST test strip Test 4 times daily. Dx: E11.9 100 each 11  . potassium chloride SA (K-DUR,KLOR-CON) 20 MEQ tablet Take 20 mEq by mouth daily.    . propranolol (INDERAL) 40 MG tablet Take 1 tablet (40 mg total) by mouth 2 (two) times daily. 180 tablet 3  . simvastatin (ZOCOR) 40 MG tablet Take 40 mg by mouth daily. [facility]- dose unclear but taking 1/2 tablet    . sucralfate (CARAFATE) 1 G tablet Take 1 g by mouth 4 (four) times daily -  with meals and at bedtime.     No current facility-administered medications for this visit.     Objective: BP 128/70   Pulse 85   Temp 98.2 F (36.8 C) (Oral)   Ht 6' (1.829 m)   Wt 261 lb (118.4 kg)   SpO2 95%   BMI 35.40 kg/m  Gen: NAD,  resting comfortably CV: RRR no murmurs rubs or gallops Lungs: CTAB no crackles, wheeze, rhonchi Abdomen: soft/nontender/nondistended/normal bowel sounds.  Ext: no edema Skin: warm, dry  Assessment/Plan:  Former smoker- AAA medicare screen ordered today  Morbid obesity ([place]), Chronic- BMI over 35 with diabetes, hypertension, hyperlipidemia. Discussed healthy eating. His exercise is limited by his back.   Chronic lymphocytic leukemia ([place]), Chronic- CBC largely stable. Neutrophils up some- patient told to monitor for illness. Last seen by Dr. Alen Blew 10/30/17. Has been asymptomatic.   Also see [facility] notes.   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[facility]) S:  controlled. On 102 units of lantus and 46 units short acting insulin before meals.  CBGs- up with his steroid  injection lately- this morning was 123.  Exercise and diet- back pain limits exercise . Weight down 2 lbs form last visit Lab Results  Component Value Date   HGBA1C 7.4 (H) 07/16/2017   HGBA1C 7.3 04/02/2017   HGBA1C 7.1 (H) 12/24/2016   A/P: a1c 7.4 again today- I believe 7.5 is a reasonable goal for patient- thankful no hypoglycemia  Essential hypertension S: controlled on repeat on amlodipine 10mg , hctz 25mg , losartan 100mg . BP at [facility] was not rechecked- on same date of service BP Readings from Last 3 Encounters:  12/18/17 128/70  12/18/17 (!) 142/50  10/30/17 130/63  A/P: We discussed blood pressure goal of <140/90. Continue current med  Hyperlipidemia S: very well  controlled on simvastatin 40mg  (half of 80mg  tablet). No myalgias. He brings in his medicine- we thought he was taking half of 40mg  but actually half of 80mg  Lab Results  Component Value Date   CHOL 126 07/22/2017   HDL 62 07/22/2017   LDLCALC 28 07/22/2017   LDLDIRECT 43.0 12/24/2016   TRIG 180 (A) 07/22/2017   CHOLHDL 2 07/16/2017   A/P: we discussed reduce dose of simvastatin to 20mg - he declines   Future Appointments  Date Time Provider [place]  01/22/2018  7:30 AM Hayden Pedro, MD [facility] None  04/13/2018  8:15 AM [name], MD [facility]  04/28/2018  9:00 AM [facility] 1 [facility] None  04/28/2018  9:30 AM Wyatt Portela, MD [facility] None  07/20/2018  9:45 AM Edrick Kins, DPM [facility]  medicare only   Lab/Order associations: Type 2 diabetes mellitus with retinopathy of both eyes, with long-term current use of insulin, macular edema presence unspecified, unspecified retinopathy severity ([place]) - Plan: Hemoglobin A1c  Essential hypertension - Plan: CBC with Differential/Platelet, Comprehensive metabolic panel  Mixed hyperlipidemia - Plan: CBC with Differential/Platelet, Comprehensive metabolic panel  Hypothyroidism due to acquired atrophy of thyroid - Plan:  TSH  Former smoker - Plan: VAS US [facility] MEDICARE SCREEN  Return precautions advised.  [name], MD

## 2017-12-26 ENCOUNTER — Ambulatory Visit (HOSPITAL_COMMUNITY)
Admission: RE | Admit: 2017-12-26 | Discharge: 2017-12-26 | Disposition: A | Discharge status: Home / Self Care | Payer: Medicare Other | Source: Ambulatory Visit | Attending: Cardiology | Admitting: Cardiology

## 2017-12-26 ENCOUNTER — Encounter: Payer: Self-pay | Admitting: Family Medicine

## 2017-12-26 DIAGNOSIS — Z87891 Personal history of nicotine dependence: Secondary | ICD-10-CM

## 2017-12-27 ENCOUNTER — Encounter: Payer: Self-pay | Admitting: Family Medicine

## 2018-01-22 ENCOUNTER — Encounter (INDEPENDENT_AMBULATORY_CARE_PROVIDER_SITE_OTHER): Payer: Medicare Other | Admitting: Ophthalmology

## 2018-01-22 ENCOUNTER — Other Ambulatory Visit: Payer: Self-pay

## 2018-01-22 ENCOUNTER — Telehealth: Payer: Self-pay | Admitting: Family Medicine

## 2018-01-22 DIAGNOSIS — E11311 Type 2 diabetes mellitus with unspecified diabetic retinopathy with macular edema: Secondary | ICD-10-CM | POA: Diagnosis not present

## 2018-01-22 DIAGNOSIS — E113212 Type 2 diabetes mellitus with mild nonproliferative diabetic retinopathy with macular edema, left eye: Secondary | ICD-10-CM | POA: Diagnosis not present

## 2018-01-22 DIAGNOSIS — H35372 Puckering of macula, left eye: Secondary | ICD-10-CM

## 2018-01-22 DIAGNOSIS — H43813 Vitreous degeneration, bilateral: Secondary | ICD-10-CM

## 2018-01-22 DIAGNOSIS — E113291 Type 2 diabetes mellitus with mild nonproliferative diabetic retinopathy without macular edema, right eye: Secondary | ICD-10-CM | POA: Diagnosis not present

## 2018-01-22 DIAGNOSIS — H35033 Hypertensive retinopathy, bilateral: Secondary | ICD-10-CM

## 2018-01-22 DIAGNOSIS — H353231 Exudative age-related macular degeneration, bilateral, with active choroidal neovascularization: Secondary | ICD-10-CM | POA: Diagnosis not present

## 2018-01-22 DIAGNOSIS — I10 Essential (primary) hypertension: Secondary | ICD-10-CM | POA: Diagnosis not present

## 2018-01-22 LAB — HM DIABETES EYE EXAM

## 2018-01-22 MED ORDER — ONETOUCH ULTRA BLUE VI STRP: ORAL_STRIP | 11 refills | Status: DC

## 2018-01-22 NOTE — Telephone Encounter (Signed)
Patients wife came in to drop of the time sheets of when he checks his sugar level. She also requested a Rx for the Strips for the machine. The patient is running low.

## 2018-01-22 NOTE — Telephone Encounter (Signed)
Test strips reordered as requested.   

## 2018-01-27 ENCOUNTER — Encounter: Payer: Self-pay | Admitting: Family Medicine

## 2018-01-28 DIAGNOSIS — Z8546 Personal history of malignant neoplasm of prostate: Secondary | ICD-10-CM | POA: Diagnosis not present

## 2018-01-28 LAB — PSA: PSA: <0.015

## 2018-02-04 DIAGNOSIS — Z8546 Personal history of malignant neoplasm of prostate: Secondary | ICD-10-CM | POA: Diagnosis not present

## 2018-02-04 DIAGNOSIS — N5231 Erectile dysfunction following radical prostatectomy: Secondary | ICD-10-CM | POA: Diagnosis not present

## 2018-02-04 DIAGNOSIS — N393 Stress incontinence (female) (male): Secondary | ICD-10-CM | POA: Diagnosis not present

## 2018-02-06 ENCOUNTER — Encounter: Payer: Self-pay | Admitting: Family Medicine

## 2018-02-09 DIAGNOSIS — L309 Dermatitis, unspecified: Secondary | ICD-10-CM | POA: Diagnosis not present

## 2018-02-09 DIAGNOSIS — L821 Other seborrheic keratosis: Secondary | ICD-10-CM | POA: Diagnosis not present

## 2018-02-09 DIAGNOSIS — D229 Melanocytic nevi, unspecified: Secondary | ICD-10-CM | POA: Diagnosis not present

## 2018-02-27 ENCOUNTER — Encounter: Payer: Self-pay | Admitting: Family Medicine

## 2018-03-12 ENCOUNTER — Encounter (INDEPENDENT_AMBULATORY_CARE_PROVIDER_SITE_OTHER): Payer: Medicare Other | Admitting: Ophthalmology

## 2018-03-12 DIAGNOSIS — E11311 Type 2 diabetes mellitus with unspecified diabetic retinopathy with macular edema: Secondary | ICD-10-CM

## 2018-03-12 DIAGNOSIS — H35033 Hypertensive retinopathy, bilateral: Secondary | ICD-10-CM | POA: Diagnosis not present

## 2018-03-12 DIAGNOSIS — I10 Essential (primary) hypertension: Secondary | ICD-10-CM | POA: Diagnosis not present

## 2018-03-12 DIAGNOSIS — H353231 Exudative age-related macular degeneration, bilateral, with active choroidal neovascularization: Secondary | ICD-10-CM | POA: Diagnosis not present

## 2018-03-12 DIAGNOSIS — E113212 Type 2 diabetes mellitus with mild nonproliferative diabetic retinopathy with macular edema, left eye: Secondary | ICD-10-CM

## 2018-03-12 DIAGNOSIS — H35372 Puckering of macula, left eye: Secondary | ICD-10-CM

## 2018-03-12 DIAGNOSIS — H43813 Vitreous degeneration, bilateral: Secondary | ICD-10-CM | POA: Diagnosis not present

## 2018-03-12 DIAGNOSIS — E113291 Type 2 diabetes mellitus with mild nonproliferative diabetic retinopathy without macular edema, right eye: Secondary | ICD-10-CM | POA: Diagnosis not present

## 2018-04-13 ENCOUNTER — Ambulatory Visit: Payer: Medicare Other | Admitting: Family Medicine

## 2018-04-28 ENCOUNTER — Inpatient Hospital Stay: Payer: Medicare Other | Attending: Oncology | Admitting: Oncology

## 2018-04-28 ENCOUNTER — Telehealth: Payer: Self-pay | Admitting: Oncology

## 2018-04-28 ENCOUNTER — Inpatient Hospital Stay: Payer: Medicare Other

## 2018-04-28 VITALS — BP 137/74 | HR 70 | Temp 97.8°F | Resp 17 | Ht 72.0 in | Wt 267.5 lb

## 2018-04-28 DIAGNOSIS — Z794 Long term (current) use of insulin: Secondary | ICD-10-CM

## 2018-04-28 DIAGNOSIS — Z79899 Other long term (current) drug therapy: Secondary | ICD-10-CM | POA: Insufficient documentation

## 2018-04-28 DIAGNOSIS — C9190 Lymphoid leukemia, unspecified not having achieved remission: Secondary | ICD-10-CM

## 2018-04-28 DIAGNOSIS — C9110 Chronic lymphocytic leukemia of B-cell type not having achieved remission: Secondary | ICD-10-CM

## 2018-04-28 DIAGNOSIS — Z8546 Personal history of malignant neoplasm of prostate: Secondary | ICD-10-CM | POA: Diagnosis not present

## 2018-04-28 LAB — CBC WITH DIFFERENTIAL/PLATELET
Basophils Absolute: 0 10*3/uL (ref 0.0–0.1)
Basophils Relative: 0 %
Eosinophils Absolute: 0.4 10*3/uL (ref 0.0–0.5)
Eosinophils Relative: 4 %
HCT: 42.8 % (ref 38.4–49.9)
Hemoglobin: 14.3 g/dL (ref 13.0–17.1)
Lymphocytes Relative: 46 %
Lymphs Abs: 4.9 10*3/uL — ABNORMAL HIGH (ref 0.9–3.3)
MCH: 27.8 pg (ref 27.2–33.4)
MCHC: 33.3 g/dL (ref 32.0–36.0)
MCV: 83.4 fL (ref 79.3–98.0)
Monocytes Absolute: 0.8 10*3/uL (ref 0.1–0.9)
Monocytes Relative: 8 %
Neutro Abs: 4.5 10*3/uL (ref 1.5–6.5)
Neutrophils Relative %: 42 %
Platelets: 179 10*3/uL (ref 140–400)
RBC: 5.14 MIL/uL (ref 4.20–5.82)
RDW: 14.4 % (ref 11.0–14.6)
WBC: 10.6 10*3/uL — ABNORMAL HIGH (ref 4.0–10.3)

## 2018-04-28 NOTE — Progress Notes (Signed)
Hematology and Oncology Follow Up Visit  [name] [id number] Apr 06, 1941 77 y.o. 04/28/2018 10:04 AM [name], MDHunter, [name], MD   Principle Diagnosis: 77 year old man with:  1.  Stage T1c prostate cancer diagnosed after presenting with Gleason score 3+4 = 7 clinical stage, PSA of 6.8 in February 2015.  He remains disease-free with undetectable PSA.  2.  Chronic lymphocytic lymphoma (CLL) diagnosed in 2015 incidentally during his prostate operation.   Prior Therapy: Sstatus post robotic prostatectomy in February 2015 with the pathology revealed a Gleason score 3+4 = 7 without any evidence of extraprostatic extension.   Current therapy: Active surveillance.  Interim History:  [name] is here for a follow-up.  Reports no major changes in his health or complaints.  He denies any adenopathy, decline in his performance status or activity level.  He denies any constitutional symptoms.  He continues to enjoy a reasonable quality of life.  He continues to follow with [name] regarding his prostate cancer with a undetectable PSA.   He does not report any headaches, blurry vision or syncope.  He denies any fevers, chills or sweats.  He does not put any chest pain, palpitation orthopnea.  He does not report any nausea, vomiting, change in his bowel habits. He does not report any hematuria, dysuria. He does not report any skeletal complaints of arthralgias or myalgias.  He denies any lymphadenopathy or petechiae.  Rest of his review of systems is negative.  Medications: I have reviewed the patient's current medications.  Current Outpatient Medications  Medication Sig Dispense Refill  . amLODipine (NORVASC) 10 MG tablet Take 10 mg by mouth every morning.    . hydrochlorothiazide (HYDRODIURIL) 25 MG tablet Take 25 mg by mouth daily.    . insulin aspart (NOVOLOG) 100 UNIT/ML injection Inject 45 Units into the skin 3 (three) times daily with meals. When sugar is elevated    .  insulin glargine (LANTUS) 100 UNIT/ML injection Inject 102 Units into the skin at bedtime.     . lansoprazole (PREVACID) 30 MG capsule Take 30 mg by mouth daily at 12 noon.    Marland Kitchen levothyroxine (SYNTHROID, LEVOTHROID) 50 MCG tablet Take 50 mcg by mouth daily before breakfast.     . losartan (COZAAR) 100 MG tablet Take 100 mg by mouth daily.    . meclizine (ANTIVERT) 25 MG tablet Take 25 mg by mouth every 6 (six) hours.    . mirtazapine (REMERON) 15 MG tablet Take 15 mg by mouth at bedtime.    . ONE TOUCH ULTRA TEST test strip Test 4 times daily. Dx: E11.9 100 each 11  . potassium chloride SA (K-DUR,KLOR-CON) 20 MEQ tablet Take 20 mEq by mouth daily.    . propranolol (INDERAL) 40 MG tablet Take 1 tablet (40 mg total) by mouth 2 (two) times daily. 180 tablet 3  . simvastatin (ZOCOR) 40 MG tablet Take 40 mg by mouth daily. [facility]- dose unclear but taking 1/2 tablet    . sucralfate (CARAFATE) 1 G tablet Take 1 g by mouth 4 (four) times daily -  with meals and at bedtime.     No current facility-administered medications for this visit.      Allergies:  Allergies  Allergen Reactions  . Morphine Sulfate Itching and Rash    Past Medical History, Surgical history, Social history, and Family History were reviewed and updated.   Blood pressure 137/74, pulse 70, temperature 97.8 F (36.6 C), temperature source Oral, resp. rate 17,  height 6' (1.829 m), weight 267 lb 8 oz (121.3 kg), SpO2 98 %. ECOG: 1 General appearance: Alert, awake gentleman without distress. Head: Atraumatic without abnormalities. Eyes: No scleral icterus. Oropharynx: Without any thrush or ulcers. Lymph nodes: No lymphadenopathy noted in the cervical, supraclavicular, or axillary nodes.  Heart:regular rate and rhythm, no murmurs or gallops. Lung: Clear to auscultation without any rhonchi, wheezes or dullness to percussion. Abdomin: Soft, nontender without any rebound or guarding. Musculoskeletal: No joint deformity or  effusion. Skin: No rashes or lesions.   Lab Results: Lab Results  Component Value Date   WBC 10.6 (H) 04/28/2018   HGB 14.3 04/28/2018   HCT 42.8 04/28/2018   MCV 83.4 04/28/2018   PLT 179 04/28/2018     Chemistry      Component Value Date/Time   NA 140 12/18/2017 1153   NA 142 10/30/2017 0845   K 3.5 12/18/2017 1153   K 3.7 10/30/2017 0845   CL 100 12/18/2017 1153   CO2 27 12/18/2017 1153   CO2 28 10/30/2017 0845   BUN 14 12/18/2017 1153   BUN 13.3 10/30/2017 0845   CREATININE 0.84 12/18/2017 1153   CREATININE 1.2 10/30/2017 0845   GLU 76 07/22/2017      Component Value Date/Time   CALCIUM 9.5 12/18/2017 1153   CALCIUM 9.5 10/30/2017 0845   ALKPHOS 60 12/18/2017 1153   ALKPHOS 53 10/30/2017 0845   AST 35 12/18/2017 1153   AST 37 (H) 10/30/2017 0845   ALT 39 12/18/2017 1153   ALT 37 10/30/2017 0845   BILITOT 1.5 (H) 12/18/2017 1153   BILITOT 1.23 (H) 10/30/2017 0845         Impression and Plan:  77 year old man with the:  1. Prostate cancer presented with clinical stage T1c, Gleason score of 3+4 = 7 in 2015.  He is status post prostatectomy with PSA remains undetectable.    He continues to follow with [name] with undetectable PSA.  He does not report any signs or symptoms of recurrent disease.  The natural course of this disease was discussed today and certainly does not require any treatment at this time.  2. CLL/SLL: Presented with lymphadenopathy at the time of his surgery.  His laboratory data were reviewed today and showed no evidence of increased lymphocytosis.  The natural course of this disease as well as indication for treatment were discussed today.  He does not have any symptoms to suggest disease progression or disease recurrence.  He does not have any constitutional symptoms, cytopenias or palpable adenopathy that requires intervention.  After discussion today, the plan is to continue with active surveillance and consider repeat imaging studies  if he develops any symptoms.   3. Follow-up: Will be in 6 months.   15  minutes was spent with the patient face-to-face today.  More than 50% of time was dedicated to patient counseling, education and coordination of his care.     Zola Button, MD 5/28/201910:04 AM

## 2018-04-28 NOTE — Telephone Encounter (Signed)
Appointments scheduled AVS/Calendar printed per 5/28 los °

## 2018-05-01 ENCOUNTER — Encounter (INDEPENDENT_AMBULATORY_CARE_PROVIDER_SITE_OTHER): Payer: Medicare Other | Admitting: Ophthalmology

## 2018-05-01 DIAGNOSIS — H35033 Hypertensive retinopathy, bilateral: Secondary | ICD-10-CM | POA: Diagnosis not present

## 2018-05-01 DIAGNOSIS — E11319 Type 2 diabetes mellitus with unspecified diabetic retinopathy without macular edema: Secondary | ICD-10-CM

## 2018-05-01 DIAGNOSIS — I10 Essential (primary) hypertension: Secondary | ICD-10-CM

## 2018-05-01 DIAGNOSIS — E113293 Type 2 diabetes mellitus with mild nonproliferative diabetic retinopathy without macular edema, bilateral: Secondary | ICD-10-CM | POA: Diagnosis not present

## 2018-05-01 DIAGNOSIS — H43813 Vitreous degeneration, bilateral: Secondary | ICD-10-CM

## 2018-05-01 DIAGNOSIS — H353231 Exudative age-related macular degeneration, bilateral, with active choroidal neovascularization: Secondary | ICD-10-CM | POA: Diagnosis not present

## 2018-05-04 DIAGNOSIS — M47816 Spondylosis without myelopathy or radiculopathy, lumbar region: Secondary | ICD-10-CM | POA: Diagnosis not present

## 2018-05-15 ENCOUNTER — Ambulatory Visit: Payer: Medicare Other | Admitting: Family Medicine

## 2018-06-17 ENCOUNTER — Encounter (INDEPENDENT_AMBULATORY_CARE_PROVIDER_SITE_OTHER): Payer: Medicare Other | Admitting: Ophthalmology

## 2018-06-17 DIAGNOSIS — E113391 Type 2 diabetes mellitus with moderate nonproliferative diabetic retinopathy without macular edema, right eye: Secondary | ICD-10-CM | POA: Diagnosis not present

## 2018-06-17 DIAGNOSIS — H35033 Hypertensive retinopathy, bilateral: Secondary | ICD-10-CM

## 2018-06-17 DIAGNOSIS — E113312 Type 2 diabetes mellitus with moderate nonproliferative diabetic retinopathy with macular edema, left eye: Secondary | ICD-10-CM

## 2018-06-17 DIAGNOSIS — H43813 Vitreous degeneration, bilateral: Secondary | ICD-10-CM

## 2018-06-17 DIAGNOSIS — H353231 Exudative age-related macular degeneration, bilateral, with active choroidal neovascularization: Secondary | ICD-10-CM

## 2018-06-17 DIAGNOSIS — I10 Essential (primary) hypertension: Secondary | ICD-10-CM

## 2018-06-17 DIAGNOSIS — E11319 Type 2 diabetes mellitus with unspecified diabetic retinopathy without macular edema: Secondary | ICD-10-CM | POA: Diagnosis not present

## 2018-06-19 ENCOUNTER — Encounter (INDEPENDENT_AMBULATORY_CARE_PROVIDER_SITE_OTHER): Payer: Medicare Other | Admitting: Ophthalmology

## 2018-06-25 ENCOUNTER — Ambulatory Visit (INDEPENDENT_AMBULATORY_CARE_PROVIDER_SITE_OTHER): Payer: Medicare Other | Admitting: Family Medicine

## 2018-06-25 ENCOUNTER — Encounter: Payer: Self-pay | Admitting: Family Medicine

## 2018-06-25 VITALS — BP 132/86 | HR 79 | Temp 98.0°F | Ht 72.0 in | Wt 262.4 lb

## 2018-06-25 DIAGNOSIS — Z794 Long term (current) use of insulin: Secondary | ICD-10-CM

## 2018-06-25 DIAGNOSIS — I10 Essential (primary) hypertension: Secondary | ICD-10-CM | POA: Diagnosis not present

## 2018-06-25 DIAGNOSIS — E11319 Type 2 diabetes mellitus with unspecified diabetic retinopathy without macular edema: Secondary | ICD-10-CM

## 2018-06-25 DIAGNOSIS — E538 Deficiency of other specified B group vitamins: Secondary | ICD-10-CM | POA: Diagnosis not present

## 2018-06-25 DIAGNOSIS — E034 Atrophy of thyroid (acquired): Secondary | ICD-10-CM | POA: Diagnosis not present

## 2018-06-25 DIAGNOSIS — E785 Hyperlipidemia, unspecified: Secondary | ICD-10-CM | POA: Diagnosis not present

## 2018-06-25 LAB — POCT GLYCOSYLATED HEMOGLOBIN (HGB A1C): Hemoglobin A1C: 7 % — AB (ref 4.0–5.6)

## 2018-06-25 LAB — POC URINALSYSI DIPSTICK (AUTOMATED)
Bilirubin, UA: NEGATIVE
Blood, UA: NEGATIVE
Glucose, UA: NEGATIVE
Ketones, UA: NEGATIVE
Leukocytes, UA: NEGATIVE
Nitrite, UA: NEGATIVE
Protein, UA: NEGATIVE
Spec Grav, UA: 1.02 (ref 1.010–1.025)
Urobilinogen, UA: 0.2 E.U./dL
pH, UA: 6.5 (ref 5.0–8.0)

## 2018-06-25 LAB — CBC WITH DIFFERENTIAL/PLATELET
Basophils Absolute: 0 10*3/uL (ref 0.0–0.1)
Basophils Relative: 0.2 % (ref 0.0–3.0)
Eosinophils Absolute: 0.4 10*3/uL (ref 0.0–0.7)
Eosinophils Relative: 3.1 % (ref 0.0–5.0)
HCT: 47.6 % (ref 39.0–52.0)
Hemoglobin: 16.1 g/dL (ref 13.0–17.0)
Lymphocytes Relative: 46.8 % — ABNORMAL HIGH (ref 12.0–46.0)
Lymphs Abs: 6.4 10*3/uL — ABNORMAL HIGH (ref 0.7–4.0)
MCHC: 33.9 g/dL (ref 30.0–36.0)
MCV: 82.4 fl (ref 78.0–100.0)
Monocytes Absolute: 0.9 10*3/uL (ref 0.1–1.0)
Monocytes Relative: 7 % (ref 3.0–12.0)
Neutro Abs: 5.8 10*3/uL (ref 1.4–7.7)
Neutrophils Relative %: 42.9 % — ABNORMAL LOW (ref 43.0–77.0)
Platelets: 224 10*3/uL (ref 150.0–400.0)
RBC: 5.78 Mil/uL (ref 4.22–5.81)
RDW: 14.6 % (ref 11.5–15.5)
WBC: 13.6 10*3/uL — ABNORMAL HIGH (ref 4.0–10.5)

## 2018-06-25 LAB — COMPREHENSIVE METABOLIC PANEL
ALT: 36 U/L (ref 0–53)
AST: 33 U/L (ref 0–37)
Albumin: 4.5 g/dL (ref 3.5–5.2)
Alkaline Phosphatase: 51 U/L (ref 39–117)
BUN: 12 mg/dL (ref 6–23)
CO2: 30 mEq/L (ref 19–32)
Calcium: 9.7 mg/dL (ref 8.4–10.5)
Chloride: 101 mEq/L (ref 96–112)
Creatinine, Ser: 0.97 mg/dL (ref 0.40–1.50)
GFR: 80.22 mL/min (ref >60.00)
Glucose, Bld: 145 mg/dL — ABNORMAL HIGH (ref 70–99)
Potassium: 4.2 mEq/L (ref 3.5–5.1)
Sodium: 140 mEq/L (ref 135–145)
Total Bilirubin: 1.1 mg/dL (ref 0.2–1.2)
Total Protein: 7.3 g/dL (ref 6.0–8.3)

## 2018-06-25 LAB — VITAMIN B12: Vitamin B-12: 282 pg/mL (ref 211–911)

## 2018-06-25 LAB — TSH: TSH: 1.75 u[IU]/mL (ref 0.35–4.50)

## 2018-06-25 NOTE — Progress Notes (Signed)
Subjective:  JESTEN CAPPUCCIO is a 77 y.o. year old very pleasant male patient who presents for/with See problem oriented charting ROS- continued back pain. No chest pain reported. No increased edema.    Past Medical History-  Patient Active Problem List   Diagnosis Date Noted  . Chronic bilateral low back pain without sciatica 12/12/2015    Priority: High  . Chronic lymphocytic leukemia ([place]) 05/16/2014    Priority: High  . History of prostate cancer 01/24/2014    Priority: High  . 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 Diabetic retinopathy ([place]) 09/24/2007    Priority: High  . Tremor 08/11/2015    Priority: Medium  . Hypothyroidism     Priority: Medium  . 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 Depression 09/21/2009    Priority: Medium  . Hyperlipidemia 09/24/2007    Priority: Medium  . OSA on CPAP 09/24/2007    Priority: Medium  . Essential hypertension 09/24/2007    Priority: Medium  . Personal history of skin cancer 04/02/2017    Priority: Low  . GERD (gastroesophageal reflux disease) 12/08/2014    Priority: Low  . Morbid obesity ([place]) 10/25/2008    Priority: Low  . History of colonic polyps 08/22/2008    Priority: Low  . Ear pressure, left 04/14/2017  . Temporomandibular joint (TMJ) pain 04/14/2017  . Pain syndrome, chronic 02/01/2016  . 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 myelopathy or radiculopathy 02/01/2016    Medications- reviewed and updated Current Outpatient Medications  Medication Sig Dispense Refill  . amLODipine (NORVASC) 10 MG tablet Take 10 mg by mouth every morning.    . hydrochlorothiazide (HYDRODIURIL) 25 MG tablet Take 25 mg by mouth daily.    . insulin aspart (NOVOLOG) 100 UNIT/ML injection Inject 45 Units into the skin 3 (three) times daily with meals. When sugar is elevated    . insulin glargine (LANTUS) 100 UNIT/ML injection Inject 102 Units into the skin at bedtime.      . lansoprazole (PREVACID) 30 MG capsule Take 30 mg by mouth daily at 12 noon.    Marland Kitchen levothyroxine (SYNTHROID, LEVOTHROID) 50 MCG tablet Take 50 mcg by mouth daily before breakfast.     . losartan (COZAAR) 100 MG tablet Take 100 mg by mouth daily.    . meclizine (ANTIVERT) 25 MG tablet Take 25 mg by mouth every 6 (six) hours.    . mirtazapine (REMERON) 15 MG tablet Take 15 mg by mouth at bedtime.    . ONE TOUCH ULTRA TEST test strip Test 4 times daily. Dx: E11.9 100 each 11  . potassium chloride SA (K-DUR,KLOR-CON) 20 MEQ tablet Take 20 mEq by mouth daily.    . propranolol (INDERAL) 40 MG tablet Take 1 tablet (40 mg total) by mouth 2 (two) times daily. 180 tablet 3  . simvastatin (ZOCOR) 40 MG tablet Take 40 mg by mouth daily. [facility]- dose unclear but taking 1/2 tablet    . sucralfate (CARAFATE) 1 G tablet Take 1 g by mouth 4 (four) times daily -  with meals and at bedtime.     Objective: BP 132/86   Pulse 79   Temp 98 F (36.7 C) (Oral)   Ht 6' (1.829 m)   Wt 262 lb 6.4 oz (119 kg)   SpO2 96%   BMI 35.59 kg/m  Gen: NAD, resting comfortably CV: RRR no murmurs rubs or gallops Lungs: CTAB  no crackles, wheeze, rhonchi Abdomen: soft/nontender/nondistended/normal bowel sounds.  Ext: trace edema Skin: warm, dry  Assessment/Plan:  Other notes: 1.known CLL- will monitor CBC. Follows with Dr. Alen Blew   b12 deficiency S:  states has had low b12 in past A/P: will update b12  Morbid obesity ([place]) BMI > 35 with DM, HTN. Advised weight loss  Hyperlipidemia S:  controlled on simvastatin 40mg  (half of 80mg  tablet) with last LDL well under 50 Lab Results  Component Value Date   CHOL 126 07/22/2017   HDL 62 07/22/2017   LDLCALC 28 07/22/2017   LDLDIRECT 43.0 12/24/2016   TRIG 180 (A) 07/22/2017   CHOLHDL 2 07/16/2017   A/P: discussed updating lipids once full year passed and would perhaps actually decrease to simvastatin 20mg  given last LDL of 28.   Essential hypertension S:  controlled on  amlodipine 10mg , hctz 25mg , losartan 100mg , propranolol 40mg  BID BP Readings from Last 3 Encounters:  06/25/18 132/86  04/28/18 137/74  12/18/17 128/70  A/P: We discussed blood pressure goal of <140/90. No changes  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HCC) S: reasonably controlled on lantus 100 units and 45 units before meals. Will target a1c 7.5 or less CBGs- 169 this AM.  Felt mild hypoglycemia symptoms a few times- CBG usually right at 70- addresses quickly Exercise and diet- weight up 1 lb from last visit Lab Results  Component Value Date   HGBA1C 7.0 (A) 06/25/2018   HGBA1C 7.4 (H) 12/18/2017   HGBA1C 7.4 (H) 07/16/2017   A/P: continue current medicine   Future Appointments  Date Time Provider [place]  07/13/2018  8:45 AM Edrick Kins, DPM [facility]  08/05/2018  7:30 AM Hayden Pedro, MD [facility] None  11/17/2018  9:00 AM [facility] None  11/17/2018 10:00 AM Wyatt Portela, MD [facility] None   Return in about 6 months (around 12/26/2018) for follow up- or sooner if needed.  Lab/Order associations: Type 2 diabetes mellitus with retinopathy of both eyes, with long-term current use of insulin, macular edema presence unspecified, unspecified retinopathy severity ([place]) - Plan: POCT glycosylated hemoglobin (Hb A1C), Comprehensive metabolic panel, POCT Urinalysis Dipstick (Automated), CBC with Differential/Platelet  B12 deficiency - Plan: Vitamin B12  Hyperlipidemia, unspecified hyperlipidemia type  Hypothyroidism due to acquired atrophy of thyroid - Plan: TSH  Morbid obesity ([place])  Essential hypertension  Return precautions advised.  [name], MD

## 2018-06-25 NOTE — Patient Instructions (Addendum)
Health Maintenance Due  Topic Date Due  . HEMOGLOBIN A1C - updated today at 7.0- that is great and under our goal of 7.5 06/17/2018   Please get the dates for your last shingrix shots- have Evon send [place] a message with the dates on the computer and we will log it in our system  Please stop by the lab before you go.

## 2018-06-25 NOTE — Assessment & Plan Note (Signed)
S: reasonably controlled on lantus 100 units and 45 units before meals. Will target a1c 7.5 or less CBGs- 169 this AM.  Felt mild hypoglycemia symptoms a few times- CBG usually right at 70- addresses quickly Exercise and diet- weight up 1 lb from last visit Lab Results  Component Value Date   HGBA1C 7.0 (A) 06/25/2018   HGBA1C 7.4 (H) 12/18/2017   HGBA1C 7.4 (H) 07/16/2017   A/P: continue current medicine

## 2018-06-25 NOTE — Assessment & Plan Note (Signed)
BMI > 35 with DM, HTN. Advised weight loss

## 2018-06-25 NOTE — Addendum Note (Signed)
Addended by: Kayren Eaves T on: 06/25/2018 08:45 AM   Modules accepted: Orders

## 2018-06-25 NOTE — Assessment & Plan Note (Signed)
S:  controlled on simvastatin 40mg  (half of 80mg  tablet) with last LDL well under 50 Lab Results  Component Value Date   CHOL 126 07/22/2017   HDL 62 07/22/2017   LDLCALC 28 07/22/2017   LDLDIRECT 43.0 12/24/2016   TRIG 180 (A) 07/22/2017   CHOLHDL 2 07/16/2017   A/P: discussed updating lipids once full year passed and would perhaps actually decrease to simvastatin 20mg  given last LDL of 28.

## 2018-06-25 NOTE — Progress Notes (Signed)
a 

## 2018-06-25 NOTE — Assessment & Plan Note (Signed)
S: controlled on  amlodipine 10mg , hctz 25mg , losartan 100mg , propranolol 40mg  BID BP Readings from Last 3 Encounters:  06/25/18 132/86  04/28/18 137/74  12/18/17 128/70  A/P: We discussed blood pressure goal of <140/90. No changes

## 2018-06-29 DIAGNOSIS — G894 Chronic pain syndrome: Secondary | ICD-10-CM | POA: Diagnosis not present

## 2018-06-29 DIAGNOSIS — M533 Sacrococcygeal disorders, not elsewhere classified: Secondary | ICD-10-CM

## 2018-06-29 DIAGNOSIS — Z5181 Encounter for therapeutic drug level monitoring: Secondary | ICD-10-CM | POA: Diagnosis not present

## 2018-06-29 DIAGNOSIS — Z79899 Other long term (current) drug therapy: Secondary | ICD-10-CM | POA: Diagnosis not present

## 2018-06-29 DIAGNOSIS — M545 Low back pain: Secondary | ICD-10-CM | POA: Diagnosis not present

## 2018-06-29 HISTORY — DX: Sacrococcygeal disorders, not elsewhere classified: M53.3

## 2018-07-13 ENCOUNTER — Ambulatory Visit (INDEPENDENT_AMBULATORY_CARE_PROVIDER_SITE_OTHER): Payer: Medicare Other | Admitting: Podiatry

## 2018-07-13 ENCOUNTER — Encounter: Payer: Self-pay | Admitting: Podiatry

## 2018-07-13 DIAGNOSIS — E0843 Diabetes mellitus due to underlying condition with diabetic autonomic (poly)neuropathy: Secondary | ICD-10-CM | POA: Diagnosis not present

## 2018-07-14 NOTE — Progress Notes (Signed)
   HPI: 77 year old male presenting today for a diabetic foot exam. He reports some peeling of the right foot intermittently. He denies modifying factors. He has not done anything for treatment at home. Patient is here for further evaluation and treatment.   Past Medical History:  Diagnosis Date  . Arthritis   . Colon polyps   . DDD (degenerative disc disease), lumbar   . DDD (degenerative disc disease), lumbar    last lumbar steroid injection 11/24/13  . Depression    PTSD  . Diabetes mellitus   . Diabetic retinopathy   . Diverticulosis of colon   . Elevated PSA   . ERECTILE DYSFUNCTION 12/30/2007   No rx.     Marland Kitchen GERD (gastroesophageal reflux disease)   . Hyperlipidemia   . Hypertension   . Hypothyroidism   . IBS (irritable bowel syndrome)   . Leukemia ([place]) 01/2014  . Macular degeneration    followed by ophthalmology  . OSA (obstructive sleep apnea)   . PONV (postoperative nausea and vomiting)   . Prostate cancer ([place])   . PTSD (post-traumatic stress disorder)    [facility]     Physical Exam: General: The patient is alert and oriented x3 in no acute distress.  Dermatology: Skin is warm, dry and supple bilateral lower extremities. Negative for open lesions or macerations.  Vascular: Palpable pedal pulses bilaterally. No edema or erythema noted. Capillary refill within normal limits.  Neurological: Epicritic and protective threshold diminished bilaterally.   Musculoskeletal Exam: Range of motion within normal limits to all pedal and ankle joints bilateral. Muscle strength 5/5 in all groups bilateral.   Assessment: 1. T2DM with polyneuropathy    Plan of Care:  1. Patient evaluated.  2. Continue wearing DM shoes and insoles.  3. Recommended good foot lotion for dry skin.  4. Return to clinic annually.      Edrick Kins, DPM [facility]  Dr. Edrick Kins, DPM    [address], [place] [address]                 Office [phone]  Fax [phone]

## 2018-07-20 ENCOUNTER — Telehealth: Payer: Self-pay | Admitting: Family Medicine

## 2018-07-20 ENCOUNTER — Ambulatory Visit: Payer: Medicare Other | Admitting: Podiatry

## 2018-07-20 NOTE — Telephone Encounter (Signed)
Patient's wife asking about getting a flu shot for patient, states they have an appointment here in [place] next Monday 07/27/18. I spoke with Cassie and came to conclusion to inform PCP team.  I let patient know that we have not received high does vaccine yet, but we may receive it this week.  Pending providers approval and whether we get it in or not I informed patient we would reach out and let them know either way. Patient's wife also stated that they were getting ready to go on vacation and was concerned more so for the number of people patient would be around. Please advise.

## 2018-07-20 NOTE — Telephone Encounter (Signed)
Spoke to pt told him we do not have the High dose Flu vaccine available yet. It is suppose to come in this week. Please call back end of week so to if we received the vaccine. Pt verbalized understanding.

## 2018-07-24 LAB — HEPATIC FUNCTION PANEL
ALT: 58 — AB (ref 10–40)
AST: 43 — AB (ref 14–40)
Alkaline Phosphatase: 55 (ref 25–125)
Bilirubin, Direct: 0.2 (ref 0.01–0.4)
Bilirubin, Total: 0.9

## 2018-07-24 LAB — PSA: PSA: <0.01

## 2018-07-24 LAB — LIPID PANEL
Cholesterol: 117 (ref 0–200)
HDL: 47 (ref 35–70)
LDL Cholesterol: 95
Triglycerides: 379 — AB (ref 40–160)

## 2018-07-24 LAB — HEMOGLOBIN A1C: Hemoglobin A1C: 7.7 % — AB (ref 4.0–5.6)

## 2018-07-24 LAB — CBC AND DIFFERENTIAL
HCT: 43 (ref 41–53)
Hemoglobin: 14.8 (ref 13.5–17.5)
Platelets: 214 (ref 150–399)
WBC: 11.4

## 2018-07-24 LAB — BASIC METABOLIC PANEL
Potassium: 4.2 (ref 3.4–5.3)
Sodium: 141 (ref 137–147)

## 2018-07-27 ENCOUNTER — Other Ambulatory Visit: Payer: Self-pay | Admitting: Dermatology

## 2018-07-27 DIAGNOSIS — L570 Actinic keratosis: Secondary | ICD-10-CM | POA: Diagnosis not present

## 2018-07-27 DIAGNOSIS — L821 Other seborrheic keratosis: Secondary | ICD-10-CM | POA: Diagnosis not present

## 2018-07-27 DIAGNOSIS — D485 Neoplasm of uncertain behavior of skin: Secondary | ICD-10-CM | POA: Diagnosis not present

## 2018-07-27 DIAGNOSIS — L820 Inflamed seborrheic keratosis: Secondary | ICD-10-CM | POA: Diagnosis not present

## 2018-07-27 DIAGNOSIS — D229 Melanocytic nevi, unspecified: Secondary | ICD-10-CM | POA: Diagnosis not present

## 2018-07-27 NOTE — Telephone Encounter (Signed)
Please give patient's wife a call to discuss the high dose dose flu vaccine. Patient's wife came in today to have the patient receive the flu vaccine. I spoke with Cassie who stated we do not have the high dose in and would not administer the regular flu vaccines until Oct.   Call to discuss further.

## 2018-07-30 ENCOUNTER — Ambulatory Visit (INDEPENDENT_AMBULATORY_CARE_PROVIDER_SITE_OTHER): Payer: Medicare Other

## 2018-07-30 DIAGNOSIS — Z23 Encounter for immunization: Secondary | ICD-10-CM

## 2018-07-30 NOTE — Progress Notes (Signed)
Per orders of Dr. Yong Channel, injection of Flu given by Francella Solian. Patient tolerated injection well. Injection given in right deltoid with no problems

## 2018-07-30 NOTE — Telephone Encounter (Signed)
Spoke to pt and wife we have the High dose Flu shot available if he would like to come in today for shot he can. Pt's wife said she will bring him today asked what time? Told her 2 or 3 this afternoon. Mrs. Brothers said she will bring him in at 3. Told her okay appt scheduled.

## 2018-08-05 ENCOUNTER — Encounter (INDEPENDENT_AMBULATORY_CARE_PROVIDER_SITE_OTHER): Payer: Medicare Other | Admitting: Ophthalmology

## 2018-08-05 DIAGNOSIS — E113311 Type 2 diabetes mellitus with moderate nonproliferative diabetic retinopathy with macular edema, right eye: Secondary | ICD-10-CM

## 2018-08-05 DIAGNOSIS — H353231 Exudative age-related macular degeneration, bilateral, with active choroidal neovascularization: Secondary | ICD-10-CM

## 2018-08-05 DIAGNOSIS — H43813 Vitreous degeneration, bilateral: Secondary | ICD-10-CM

## 2018-08-05 DIAGNOSIS — H35033 Hypertensive retinopathy, bilateral: Secondary | ICD-10-CM | POA: Diagnosis not present

## 2018-08-05 DIAGNOSIS — E113212 Type 2 diabetes mellitus with mild nonproliferative diabetic retinopathy with macular edema, left eye: Secondary | ICD-10-CM | POA: Diagnosis not present

## 2018-08-05 DIAGNOSIS — E11311 Type 2 diabetes mellitus with unspecified diabetic retinopathy with macular edema: Secondary | ICD-10-CM

## 2018-08-05 DIAGNOSIS — I10 Essential (primary) hypertension: Secondary | ICD-10-CM

## 2018-08-05 DIAGNOSIS — H35372 Puckering of macula, left eye: Secondary | ICD-10-CM

## 2018-08-06 ENCOUNTER — Encounter: Payer: Self-pay | Admitting: Family Medicine

## 2018-08-18 DIAGNOSIS — M461 Sacroiliitis, not elsewhere classified: Secondary | ICD-10-CM | POA: Diagnosis not present

## 2018-08-18 DIAGNOSIS — M533 Sacrococcygeal disorders, not elsewhere classified: Secondary | ICD-10-CM | POA: Diagnosis not present

## 2018-08-31 ENCOUNTER — Encounter: Payer: Self-pay | Admitting: Family Medicine

## 2018-09-07 ENCOUNTER — Encounter (INDEPENDENT_AMBULATORY_CARE_PROVIDER_SITE_OTHER): Payer: Medicare Other | Admitting: Ophthalmology

## 2018-09-07 DIAGNOSIS — I10 Essential (primary) hypertension: Secondary | ICD-10-CM

## 2018-09-07 DIAGNOSIS — H353231 Exudative age-related macular degeneration, bilateral, with active choroidal neovascularization: Secondary | ICD-10-CM | POA: Diagnosis not present

## 2018-09-07 DIAGNOSIS — H43813 Vitreous degeneration, bilateral: Secondary | ICD-10-CM | POA: Diagnosis not present

## 2018-09-07 DIAGNOSIS — H35033 Hypertensive retinopathy, bilateral: Secondary | ICD-10-CM | POA: Diagnosis not present

## 2018-09-07 DIAGNOSIS — H35372 Puckering of macula, left eye: Secondary | ICD-10-CM | POA: Diagnosis not present

## 2018-10-08 ENCOUNTER — Encounter: Payer: Self-pay | Admitting: Family Medicine

## 2018-10-23 ENCOUNTER — Encounter (INDEPENDENT_AMBULATORY_CARE_PROVIDER_SITE_OTHER): Payer: Medicare Other | Admitting: Ophthalmology

## 2018-10-23 DIAGNOSIS — I10 Essential (primary) hypertension: Secondary | ICD-10-CM | POA: Diagnosis not present

## 2018-10-23 DIAGNOSIS — H43813 Vitreous degeneration, bilateral: Secondary | ICD-10-CM | POA: Diagnosis not present

## 2018-10-23 DIAGNOSIS — H353231 Exudative age-related macular degeneration, bilateral, with active choroidal neovascularization: Secondary | ICD-10-CM | POA: Diagnosis not present

## 2018-10-23 DIAGNOSIS — H35033 Hypertensive retinopathy, bilateral: Secondary | ICD-10-CM

## 2018-11-17 ENCOUNTER — Inpatient Hospital Stay (HOSPITAL_BASED_OUTPATIENT_CLINIC_OR_DEPARTMENT_OTHER): Payer: Medicare Other | Admitting: Oncology

## 2018-11-17 ENCOUNTER — Inpatient Hospital Stay: Payer: Medicare Other | Attending: Oncology

## 2018-11-17 ENCOUNTER — Telehealth: Payer: Self-pay

## 2018-11-17 VITALS — BP 132/93 | HR 96 | Temp 98.9°F | Resp 18 | Ht 72.0 in | Wt 265.8 lb

## 2018-11-17 DIAGNOSIS — C61 Malignant neoplasm of prostate: Secondary | ICD-10-CM | POA: Diagnosis not present

## 2018-11-17 DIAGNOSIS — Z79899 Other long term (current) drug therapy: Secondary | ICD-10-CM | POA: Diagnosis not present

## 2018-11-17 DIAGNOSIS — C9110 Chronic lymphocytic leukemia of B-cell type not having achieved remission: Secondary | ICD-10-CM

## 2018-11-17 DIAGNOSIS — Z794 Long term (current) use of insulin: Secondary | ICD-10-CM | POA: Insufficient documentation

## 2018-11-17 DIAGNOSIS — Z8546 Personal history of malignant neoplasm of prostate: Secondary | ICD-10-CM | POA: Diagnosis not present

## 2018-11-17 DIAGNOSIS — N393 Stress incontinence (female) (male): Secondary | ICD-10-CM | POA: Diagnosis not present

## 2018-11-17 DIAGNOSIS — N5231 Erectile dysfunction following radical prostatectomy: Secondary | ICD-10-CM | POA: Diagnosis not present

## 2018-11-17 LAB — CBC WITH DIFFERENTIAL (CANCER CENTER ONLY)
Abs Immature Granulocytes: 0.03 10*3/uL (ref 0.00–0.07)
Basophils Absolute: 0.1 10*3/uL (ref 0.0–0.1)
Basophils Relative: 0 %
Eosinophils Absolute: 0.3 10*3/uL (ref 0.0–0.5)
Eosinophils Relative: 2 %
HCT: 45.4 % (ref 39.0–52.0)
Hemoglobin: 15.4 g/dL (ref 13.0–17.0)
Immature Granulocytes: 0 %
Lymphocytes Relative: 43 %
Lymphs Abs: 5.9 10*3/uL — ABNORMAL HIGH (ref 0.7–4.0)
MCH: 27.7 pg (ref 26.0–34.0)
MCHC: 33.9 g/dL (ref 30.0–36.0)
MCV: 81.8 fL (ref 80.0–100.0)
Monocytes Absolute: 0.9 10*3/uL (ref 0.1–1.0)
Monocytes Relative: 7 %
Neutro Abs: 6.4 10*3/uL (ref 1.7–7.7)
Neutrophils Relative %: 48 %
Platelet Count: 218 10*3/uL (ref 150–400)
RBC: 5.55 MIL/uL (ref 4.22–5.81)
RDW: 13.7 % (ref 11.5–15.5)
WBC Count: 13.6 10*3/uL — ABNORMAL HIGH (ref 4.0–10.5)
nRBC: 0 % (ref 0.0–0.2)

## 2018-11-17 NOTE — Telephone Encounter (Signed)
Printed avs and calender opf upcoming appointment. Per 12/17 los

## 2018-11-17 NOTE — Progress Notes (Signed)
Hematology and Oncology Follow Up Visit  [name] [id number] [birth year] 77 y.o. 11/17/2018 9:01 AM [name], MDHunter, [name], MD   Principle Diagnosis: 77 year old man with:  1.  Prostate cancer diagnosed in 2015.  He presented with stage T1c, Gleason score 3+4 = 7 PSA of 6.8 in February 2015.   2.  Chronic lymphocytic lymphoma (CLL) diagnosed in 2015 incidentally during his prostate operation.   Prior Therapy: Status post robotic prostatectomy in February 2015 with the pathology revealed a Gleason score 3+4 = 7 without any evidence of extraprostatic extension.   Current therapy: Active surveillance.  Interim History:  [name] presents today for a repeat evaluation.  Since the last visit, he reports no major changes in his health.  He remains ambulatory although his movement is limited because of his weight and other comorbid conditions.  He denies any lymphadenopathy, constitutional symptoms or changes in his appetite.  He has gained some weight but no other changes in his health.  His performance status and quality of life is unchanged.   He does not report any headaches, blurry vision or syncope.  He denies alteration mental status or confusion.  Any he denies any fevers, chills or sweats.  He does not put any chest pain, palpitation orthopnea.  He does not report any nausea, vomiting.denies any constipation or diarrhea.  He does not report any hematuria, dysuria. He does not report any bone pain or pathological fractures.  He denies any lymphadenopathy or petechiae.  Rest of his review of systems is negative.  Medications: I have reviewed the patient's current medications.  Current Outpatient Medications  Medication Sig Dispense Refill  . amLODipine (NORVASC) 10 MG tablet Take 10 mg by mouth every morning.    . hydrochlorothiazide (HYDRODIURIL) 25 MG tablet Take 25 mg by mouth daily.    . insulin aspart (NOVOLOG) 100 UNIT/ML injection Inject 45 Units into the skin  3 (three) times daily with meals. When sugar is elevated    . insulin glargine (LANTUS) 100 UNIT/ML injection Inject 102 Units into the skin at bedtime.     . lansoprazole (PREVACID) 30 MG capsule Take 30 mg by mouth daily at 12 noon.    Marland Kitchen levothyroxine (SYNTHROID, LEVOTHROID) 50 MCG tablet Take 50 mcg by mouth daily before breakfast.     . losartan (COZAAR) 100 MG tablet Take 100 mg by mouth daily.    . meclizine (ANTIVERT) 25 MG tablet Take 25 mg by mouth every 6 (six) hours.    . mirtazapine (REMERON) 15 MG tablet Take 15 mg by mouth at bedtime.    . ONE TOUCH ULTRA TEST test strip Test 4 times daily. Dx: E11.9 100 each 11  . potassium chloride SA (K-DUR,KLOR-CON) 20 MEQ tablet Take 20 mEq by mouth daily.    . propranolol (INDERAL) 40 MG tablet Take 1 tablet (40 mg total) by mouth 2 (two) times daily. 180 tablet 3  . simvastatin (ZOCOR) 40 MG tablet Take 40 mg by mouth daily. [facility]- dose unclear but taking 1/2 tablet    . sucralfate (CARAFATE) 1 G tablet Take 1 g by mouth 4 (four) times daily -  with meals and at bedtime.    . traMADol (ULTRAM) 50 MG tablet Take 50 mg by mouth every 6 (six) hours as needed. for pain  0   No current facility-administered medications for this visit.      Allergies:  Allergies  Allergen Reactions  . Morphine Sulfate Itching and  Rash    Past Medical History, Surgical history, Social history, and Family History were reviewed and updated.  Blood pressure (!) 132/93, pulse 96, temperature 98.9 F (37.2 C), temperature source Oral, resp. rate 18, height 6' (1.829 m), weight 265 lb 12.8 oz (120.6 kg), SpO2 100 %.   ECOG: 1   General appearance: Comfortable appearing without any discomfort Head: Normocephalic without any trauma Oropharynx: Mucous membranes are moist and pink without any thrush or ulcers. Eyes: Pupils are equal and round reactive to light. Lymph nodes: No cervical, supraclavicular, inguinal or axillary lymphadenopathy.    Heart:regular rate and rhythm.  S1 and S2 without leg edema. Lung: Clear without any rhonchi or wheezes.  No dullness to percussion. Abdomin: Soft, nontender, nondistended with good bowel sounds.  No hepatosplenomegaly. Musculoskeletal: No joint deformity or effusion.  Full range of motion noted. Neurological: No deficits noted on motor, sensory and deep tendon reflex exam. Skin: No petechial rash or dryness.  Appeared moist.  .   Lab Results: Lab Results  Component Value Date   WBC 11.4 07/24/2018   HGB 14.8 07/24/2018   HCT 43 07/24/2018   MCV 82.4 06/25/2018   PLT 214 07/24/2018     Chemistry      Component Value Date/Time   NA 141 07/24/2018   NA 142 10/30/2017 0845   K 4.2 07/24/2018   K 3.7 10/30/2017 0845   CL 101 06/25/2018 0832   CO2 30 06/25/2018 0832   CO2 28 10/30/2017 0845   BUN 12 06/25/2018 0832   BUN 13.3 10/30/2017 0845   CREATININE 0.97 06/25/2018 0832   CREATININE 1.2 10/30/2017 0845   GLU 76 07/22/2017      Component Value Date/Time   CALCIUM 9.7 06/25/2018 0832   CALCIUM 9.5 10/30/2017 0845   ALKPHOS 55 07/24/2018   ALKPHOS 53 10/30/2017 0845   AST 43 (A) 07/24/2018   AST 37 (H) 10/30/2017 0845   ALT 58 (A) 07/24/2018   ALT 37 10/30/2017 0845   BILITOT 1.1 06/25/2018 0832   BILITOT 1.23 (H) 10/30/2017 0845         Impression and Plan:  77 year old man:  1.  Stage I prostate cancer noted in 2015.  He presented with Gleason score of 3+4 = 7 and remains disease-free after prostatectomy.  His follow-up PSA continues to be undetectable under the care of [name].  The natural course of his disease and risk of relapse was discussed and at this time no evidence of relapse is detected.  His last PSA remains undetectable.  2. CLL/SLL: This was detected in 2015 following his prostate surgery.  He remains on active surveillance without any indication to treat.  Laboratory data from 11/17/2018 personally reviewed today and discussed with the  patient.  His white cell count remains mildly elevated although not changed in the last year.  Indication to treat the CLL were reviewed again which include constitutional symptoms, peripheral adenopathy, bone marrow failure as well as rapid white cell count increased.  At this time he has no indication to treat and I recommended active surveillance.   3. Follow-up: Will be in 9 months.   15  minutes was spent with the patient face-to-face today.  More than 50% of time was dedicated to reviewing laboratory data, disease status update and management options.     Zola Button, MD 12/17/20199:01 AM

## 2018-11-20 DIAGNOSIS — M533 Sacrococcygeal disorders, not elsewhere classified: Secondary | ICD-10-CM | POA: Diagnosis not present

## 2018-12-11 ENCOUNTER — Encounter (INDEPENDENT_AMBULATORY_CARE_PROVIDER_SITE_OTHER): Payer: Medicare Other | Admitting: Ophthalmology

## 2018-12-11 DIAGNOSIS — H353231 Exudative age-related macular degeneration, bilateral, with active choroidal neovascularization: Secondary | ICD-10-CM

## 2018-12-11 DIAGNOSIS — I10 Essential (primary) hypertension: Secondary | ICD-10-CM | POA: Diagnosis not present

## 2018-12-11 DIAGNOSIS — H35033 Hypertensive retinopathy, bilateral: Secondary | ICD-10-CM | POA: Diagnosis not present

## 2018-12-11 DIAGNOSIS — H43813 Vitreous degeneration, bilateral: Secondary | ICD-10-CM | POA: Diagnosis not present

## 2018-12-11 LAB — HM DIABETES EYE EXAM

## 2018-12-14 ENCOUNTER — Telehealth: Payer: Self-pay | Admitting: Oncology

## 2018-12-14 NOTE — Telephone Encounter (Signed)
Called patient per 01/03 voicemail log. Rescheduled patient appointment per patient request.

## 2018-12-15 ENCOUNTER — Encounter: Payer: Self-pay | Admitting: Family Medicine

## 2018-12-28 ENCOUNTER — Encounter: Payer: Self-pay | Admitting: Family Medicine

## 2018-12-28 ENCOUNTER — Ambulatory Visit (INDEPENDENT_AMBULATORY_CARE_PROVIDER_SITE_OTHER): Payer: Medicare Other | Admitting: Family Medicine

## 2018-12-28 VITALS — BP 134/84 | HR 84 | Temp 98.4°F | Ht 72.0 in | Wt 267.8 lb

## 2018-12-28 DIAGNOSIS — Z6836 Body mass index (BMI) 36.0-36.9, adult: Secondary | ICD-10-CM | POA: Diagnosis not present

## 2018-12-28 DIAGNOSIS — E034 Atrophy of thyroid (acquired): Secondary | ICD-10-CM | POA: Diagnosis not present

## 2018-12-28 DIAGNOSIS — E113299 Type 2 diabetes mellitus with mild nonproliferative diabetic retinopathy without macular edema, unspecified eye: Secondary | ICD-10-CM

## 2018-12-28 DIAGNOSIS — E785 Hyperlipidemia, unspecified: Secondary | ICD-10-CM

## 2018-12-28 DIAGNOSIS — E538 Deficiency of other specified B group vitamins: Secondary | ICD-10-CM | POA: Diagnosis not present

## 2018-12-28 DIAGNOSIS — C9110 Chronic lymphocytic leukemia of B-cell type not having achieved remission: Secondary | ICD-10-CM

## 2018-12-28 DIAGNOSIS — Z794 Long term (current) use of insulin: Secondary | ICD-10-CM | POA: Diagnosis not present

## 2018-12-28 DIAGNOSIS — E11319 Type 2 diabetes mellitus with unspecified diabetic retinopathy without macular edema: Secondary | ICD-10-CM | POA: Diagnosis not present

## 2018-12-28 DIAGNOSIS — D692 Other nonthrombocytopenic purpura: Secondary | ICD-10-CM | POA: Diagnosis not present

## 2018-12-28 LAB — COMPREHENSIVE METABOLIC PANEL
ALT: 30 U/L (ref 0–53)
AST: 23 U/L (ref 0–37)
Albumin: 4.4 g/dL (ref 3.5–5.2)
Alkaline Phosphatase: 51 U/L (ref 39–117)
BUN: 17 mg/dL (ref 6–23)
CO2: 30 mEq/L (ref 19–32)
Calcium: 9.5 mg/dL (ref 8.4–10.5)
Chloride: 100 mEq/L (ref 96–112)
Creatinine, Ser: 0.91 mg/dL (ref 0.40–1.50)
GFR: 81.14 mL/min (ref >60.00)
Glucose, Bld: 211 mg/dL — ABNORMAL HIGH (ref 70–99)
Potassium: 4.1 mEq/L (ref 3.5–5.1)
Sodium: 140 mEq/L (ref 135–145)
Total Bilirubin: 1.1 mg/dL (ref 0.2–1.2)
Total Protein: 6.3 g/dL (ref 6.0–8.3)

## 2018-12-28 LAB — CBC WITH DIFFERENTIAL/PLATELET
Basophils Absolute: 0 10*3/uL (ref 0.0–0.1)
Basophils Relative: 0.2 % (ref 0.0–3.0)
Eosinophils Absolute: 0.3 10*3/uL (ref 0.0–0.7)
Eosinophils Relative: 1.9 % (ref 0.0–5.0)
HCT: 46.2 % (ref 39.0–52.0)
Hemoglobin: 15.5 g/dL (ref 13.0–17.0)
Lymphocytes Relative: 39.8 % (ref 12.0–46.0)
Lymphs Abs: 5.4 10*3/uL — ABNORMAL HIGH (ref 0.7–4.0)
MCHC: 33.6 g/dL (ref 30.0–36.0)
MCV: 84.6 fl (ref 78.0–100.0)
Monocytes Absolute: 0.8 10*3/uL (ref 0.1–1.0)
Monocytes Relative: 5.9 % (ref 3.0–12.0)
Neutro Abs: 7 10*3/uL (ref 1.4–7.7)
Neutrophils Relative %: 52.2 % (ref 43.0–77.0)
Platelets: 222 10*3/uL (ref 150.0–400.0)
RBC: 5.46 Mil/uL (ref 4.22–5.81)
RDW: 15 % (ref 11.5–15.5)
WBC: 13.5 10*3/uL — ABNORMAL HIGH (ref 4.0–10.5)

## 2018-12-28 LAB — LDL CHOLESTEROL, DIRECT: Direct LDL: 49 mg/dL

## 2018-12-28 LAB — TSH: TSH: 1.95 u[IU]/mL (ref 0.35–4.50)

## 2018-12-28 LAB — VITAMIN B12: Vitamin B-12: >1525 pg/mL — ABNORMAL HIGH (ref 211–911)

## 2018-12-28 LAB — HEMOGLOBIN A1C: Hgb A1c MFr Bld: 7.7 % — ABNORMAL HIGH (ref 4.6–6.5)

## 2018-12-28 MED ORDER — PROPRANOLOL HCL 40 MG PO TABS: 40.0000 mg | ORAL_TABLET | Freq: Two times a day (BID) | ORAL | 3 refills | Status: DC

## 2018-12-28 MED ORDER — CYANOCOBALAMIN 1000 MCG/ML IJ SOLN
1000.0000 ug | Freq: Once | INTRAMUSCULAR | Status: AC
  Administered 2018-12-28: 1000 ug via INTRAMUSCULAR

## 2018-12-28 NOTE — Addendum Note (Signed)
Addended by: Lyndle Herrlich on: 12/28/2018 08:14 AM   Modules accepted: Orders

## 2018-12-28 NOTE — Progress Notes (Addendum)
Phone [phone]   Subjective:  [name] is a 78 y.o. year old very pleasant male patient who presents for/with See problem oriented charting ROS- does have blurry vision (retinopathy and macular degeneration. No chest pain. Can get winded but is very sedentary.t race edeam   BMI monitoring- elevated BMI noted: Body mass index is 36.32 kg/m. Encouraged need for healthy eating, regular exercise, weight loss.  Technically with morbid obesity with BMI over 35 and hypertension, hyperlipidemia, diabetes BMI Metric Follow Up - 12/28/18 0747      BMI Metric Follow Up-Please document annually   BMI Metric Follow Up  Education provided      Past Medical History-  Patient Active Problem List   Diagnosis Date Noted  . Chronic bilateral low back pain without sciatica 12/12/2015    Priority: High  . Chronic lymphocytic leukemia ([place]) 05/16/2014    Priority: High  . History of prostate cancer 01/24/2014    Priority: High  . 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 Diabetic retinopathy ([place]) 09/24/2007    Priority: High  . Tremor 08/11/2015    Priority: Medium  . Hypothyroidism     Priority: Medium  . 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 Depression 09/21/2009    Priority: Medium  . Hyperlipidemia 09/24/2007    Priority: Medium  . OSA on CPAP 09/24/2007    Priority: Medium  . Essential hypertension 09/24/2007    Priority: Medium  . Personal history of skin cancer 04/02/2017    Priority: Low  . GERD (gastroesophageal reflux disease) 12/08/2014    Priority: Low  . Morbid obesity ([place]) 10/25/2008    Priority: Low  . History of colonic polyps 08/22/2008    Priority: Low  . 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 Ear pressure, left 04/14/2017  . Temporomandibular joint (TMJ) pain 04/14/2017  . Pain syndrome, chronic 02/01/2016  . 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 myelopathy or radiculopathy  02/01/2016    Medications- reviewed and updated Current Outpatient Medications  Medication Sig Dispense Refill  . amLODipine (NORVASC) 10 MG tablet Take 10 mg by mouth every morning.    . hydrochlorothiazide (HYDRODIURIL) 25 MG tablet Take 25 mg by mouth daily.    . insulin aspart (NOVOLOG) 100 UNIT/ML injection Inject 45 Units into the skin 3 (three) times daily with meals. When sugar is elevated    . insulin glargine (LANTUS) 100 UNIT/ML injection Inject 102 Units into the skin at bedtime.     . lansoprazole (PREVACID) 30 MG capsule Take 30 mg by mouth daily at 12 noon.    Marland Kitchen levothyroxine (SYNTHROID, LEVOTHROID) 50 MCG tablet Take 50 mcg by mouth daily before breakfast.     . losartan (COZAAR) 100 MG tablet Take 100 mg by mouth daily.    . meclizine (ANTIVERT) 25 MG tablet Take 25 mg by mouth every 6 (six) hours.    . mirtazapine (REMERON) 15 MG tablet Take 15 mg by mouth at bedtime.    . ONE TOUCH ULTRA TEST test strip Test 4 times daily. Dx: E11.9 100 each 11  . potassium chloride SA (K-DUR,KLOR-CON) 20 MEQ tablet Take 20 mEq by mouth daily.    . propranolol (INDERAL) 40 MG tablet Take 1 tablet (40 mg total) by mouth 2 (two) times daily. 180 tablet 3  . simvastatin (ZOCOR) 40 MG tablet Take 40 mg by mouth daily. [facility] dose [facility] but taking  1/2 tablet    . sucralfate (CARAFATE) 1 G tablet Take 1 g by mouth 4 (four) times daily -  with meals and at bedtime.    . traMADol (ULTRAM) 50 MG tablet Take 50 mg by mouth every 6 (six) hours as needed. for pain  0   No current facility-administered medications for this visit.      Objective:  BP 134/84 (BP Location: Right Arm, Patient Position: Sitting, Cuff Size: Large)   Pulse 84   Temp 98.4 F (36.9 C) (Oral)   Ht 6' (1.829 m)   Wt 267 lb 12.8 oz (121.5 kg)   SpO2 94%   BMI 36.32 kg/m  Gen: NAD, resting comfortably CV: RRR no murmurs rubs or gallops Lungs: CTAB no crackles, wheeze, rhonchi Abdomen:  soft/nontender/nondistended/normal bowel sounds. No rebound or guarding.  Ext: trace edema Skin: warm, dry Neuro: hard of hearing   Diabetic Foot Exam - Simple   Simple Foot Form Diabetic Foot exam was performed with the following findings:  Yes 12/28/2018  8:04 AM  Visual Inspection No deformities, no ulcerations, no other skin breakdown bilaterally:  Yes Sensation Testing See comments:  Yes Pulse Check Posterior Tibialis and Dorsalis pulse intact bilaterally:  Yes Comments Decreased sensation in bottom of feet        Assessment and Plan    #Hyperlipidemia S: Controlled on simvastatin 40 mg-half of 80 mg tablet was last LDL under 100.  [facility] also [facility] gemfibrozil with triglycerides 379 but he states not taking it yet.  A/P: Prior LDLs have been below 70- we will update direct LDL today   #Hypertension S: Controlled on amlodipine 10 mg, hydrochlorothiazide 25 mg, losartan 100 mg. Had been on propranolol 40 mg twice a day but has been off for at least a few months - wants to retrial.  A/P:  Stable. Continue current medications..  Blood pressure goal at least under 140/90 - refilled propranolol for him (prior through Neurology)    #Diabetes type 2 with ophthalmic complications-known diabetic retinopathy S: Compliant with Lantus 100 units.  Also uses short acting insulin 45 units before meals.  A1c goal 7.5 or less hopefully.   Patient is compliant with ophthalmology follow-up-denies recent worsening of retinopathy Lab Results  Component Value Date   HGBA1C 7.7 (A) 07/24/2018   A/P: [facility]-update A1c today -Update foot exam- some neuropathy with loss of sensation in bottom of feet - Continue ophthalmology follow-up. Also with macular degeneration  -Does report recent steroid injection in the back-we will consider this in light of A1c. Really helps back but sugars go up- got up over 300. Did adjust insulin up some -Discussed role of obesity,  healthy eating, regular exercise as per above in subjective section for obesity. Not exercising right now -also [facility]    #Hypothyroidism S: Compliant with levothyroxine 50 mcg A/P: Suspect Stable. Continue current medications as long as TSH at goal   #B12 deficiency S: Patient noted to have low normal B12 last visit.  Reports history of B12 deficiency.  Recommended 1000 mcg supplementation per day.  He started but then stopped- we will see where the levels are Lab Results  Component Value Date   VITAMINB12 282 06/25/2018   A/P: Suspect stable-update B12 today- he requests b12 injection due to issues with compliance with medicine- we will consider giving each visit  %  Patient with known CLL-continues to follow with Dr. Alen Blew.  Stable.  #  easy bruising and bleeding noted- wife feels like lotion helps. 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   Future Appointments  Date Time Provider [place]  12/28/2018  8:20 AM [name], MD [facility]  01/25/2019  7:30 AM Hayden Pedro, MD [facility] None  08/26/2019 10:00 AM [facility] 5 [facility] None  08/26/2019 10:30 AM Wyatt Portela, MD [facility] None   No follow-ups on file.  Lab/Order associations: No diagnosis found.  No orders of the defined types were placed in this encounter.   Return precautions advised.  [name], MD

## 2018-12-28 NOTE — Patient Instructions (Addendum)
Health Maintenance Due  Topic Date Due  . FOOT EXAM  07/16/2018   Great to see you today!   Please stop by lab before you go If you do not have mychart- we will call you about results within 5 business days of [place] receiving them.  If you have mychart- we will send your results within 3 business days of [place] receiving them.  If abnormal or we want to clarify a result, we will call or mychart you to make sure you receive the message.  If you have questions or concerns or don't hear within 5-7 days, please send [place] a message or call us.    Check your home meds to see if you are taking gemfibrozil for cholesterol in addition to your simvastatin

## 2018-12-29 ENCOUNTER — Encounter: Payer: Self-pay | Admitting: Family Medicine

## 2018-12-29 NOTE — Telephone Encounter (Signed)
Okay for b12 injections Please advise

## 2019-01-18 ENCOUNTER — Ambulatory Visit (INDEPENDENT_AMBULATORY_CARE_PROVIDER_SITE_OTHER): Payer: Medicare Other | Admitting: Family Medicine

## 2019-01-18 ENCOUNTER — Encounter: Payer: Self-pay | Admitting: Family Medicine

## 2019-01-18 VITALS — BP 138/72 | HR 75 | Temp 100.2°F | Ht 72.0 in | Wt 265.6 lb

## 2019-01-18 DIAGNOSIS — B9689 Other specified bacterial agents as the cause of diseases classified elsewhere: Secondary | ICD-10-CM | POA: Diagnosis not present

## 2019-01-18 DIAGNOSIS — J329 Chronic sinusitis, unspecified: Secondary | ICD-10-CM | POA: Diagnosis not present

## 2019-01-18 DIAGNOSIS — R509 Fever, unspecified: Secondary | ICD-10-CM

## 2019-01-18 LAB — POCT INFLUENZA A/B
Influenza A, POC: NEGATIVE
Influenza B, POC: NEGATIVE

## 2019-01-18 MED ORDER — DOXYCYCLINE HYCLATE 100 MG PO TABS: 100.0000 mg | ORAL_TABLET | Freq: Two times a day (BID) | ORAL | 0 refills | Status: AC

## 2019-01-18 NOTE — Progress Notes (Signed)
PCP: [name], MD  Subjective:  [name] is a 78 y.o. year old very pleasant male patient who presents with sinusitis symptoms including nasal congestion, sinus tenderness  Almost 20 days of symptoms Coughing up white phlegm Minimal sinus discharge Not much sinus pressure Body aches over last few days Low grade temperature to 100.2  Acutely worsened over the weekend Not short of breath with this.  Dealing with body aches.  Had some nausea today.  No dysuria or new polyuria.  Feel fatigued.  Symptoms worsening in last few days.  -sick contacts/travel/risks: denies known flu exposure.  -Hx of: allergies  ROS-denies fever (did have mildly elevated temperature today, SOB,  vomiting, diarrhea, tooth pain. Has had some nausea.   Pertinent Past Medical History-  Patient Active Problem List   Diagnosis Date Noted  . Chronic bilateral low back pain without sciatica 12/12/2015    Priority: High  . Chronic lymphocytic leukemia ([place]) 05/16/2014    Priority: High  . History of prostate cancer 01/24/2014    Priority: High  . 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 Diabetic retinopathy ([place]) 09/24/2007    Priority: High  . Tremor 08/11/2015    Priority: Medium  . Hypothyroidism     Priority: Medium  . 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 Depression 09/21/2009    Priority: Medium  . Hyperlipidemia 09/24/2007    Priority: Medium  . OSA on CPAP 09/24/2007    Priority: Medium  . Essential hypertension 09/24/2007    Priority: Medium  . Personal history of skin cancer 04/02/2017    Priority: Low  . GERD (gastroesophageal reflux disease) 12/08/2014    Priority: Low  . Morbid obesity ([place]) 10/25/2008    Priority: Low  . History of colonic polyps 08/22/2008    Priority: Low  . 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 Ear pressure, left 04/14/2017  . Temporomandibular joint (TMJ) pain  04/14/2017  . Pain syndrome, chronic 02/01/2016  . 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 myelopathy or radiculopathy 02/01/2016    Medications- reviewed  Current Outpatient Medications  Medication Sig Dispense Refill  . amLODipine (NORVASC) 10 MG tablet Take 10 mg by mouth every morning.    . fenofibrate 160 MG tablet Take 160 mg by mouth daily.    . hydrochlorothiazide (HYDRODIURIL) 25 MG tablet Take 25 mg by mouth daily.    Marland Kitchen ibuprofen (ADVIL,MOTRIN) 600 MG tablet Take 600 mg by mouth every 6 (six) hours as needed.    . insulin aspart (NOVOLOG) 100 UNIT/ML injection Inject 45 Units into the skin 3 (three) times daily with meals. When sugar is elevated    . insulin glargine (LANTUS) 100 UNIT/ML injection Inject 102 Units into the skin at bedtime.     . lansoprazole (PREVACID) 30 MG capsule Take 30 mg by mouth daily at 12 noon.    Marland Kitchen levothyroxine (SYNTHROID, LEVOTHROID) 50 MCG tablet Take 50 mcg by mouth daily before breakfast.     . losartan (COZAAR) 100 MG tablet Take 100 mg by mouth daily.    . meclizine (ANTIVERT) 25 MG tablet Take 25 mg by mouth every 6 (six) hours.    . mirtazapine (REMERON) 15 MG tablet Take 15 mg by mouth at bedtime.    . ONE TOUCH ULTRA TEST test strip Test 4 times daily. Dx: E11.9 100 each 11  . OVER THE COUNTER MEDICATION PreserVision for eyes    .  potassium chloride SA (K-DUR,KLOR-CON) 20 MEQ tablet Take 20 mEq by mouth daily.    . propranolol (INDERAL) 40 MG tablet Take 1 tablet (40 mg total) by mouth 2 (two) times daily. 180 tablet 3  . simvastatin (ZOCOR) 40 MG tablet Take 40 mg by mouth daily. [facility]- dose unclear but taking 1/2 tablet    . sucralfate (CARAFATE) 1 G tablet Take 1 g by mouth 4 (four) times daily -  with meals and at bedtime.    . traMADol (ULTRAM) 50 MG tablet Take 50 mg by mouth every 6 (six) hours as needed. for pain  0  . doxycycline (VIBRA-TABS) 100 MG tablet Take 1 tablet (100 mg total) by mouth 2 (two) times daily for 7 days. 14  tablet 0   No current facility-administered medications for this visit.     Objective: BP 138/72   Pulse 75   Temp 100.2 F (37.9 C) (Oral)   Ht 6' (1.829 m)   Wt 265 lb 9.6 oz (120.5 kg)   SpO2 92%   BMI 36.02 kg/m  Gen: NAD, appears fatigued, intermittent cough, wearing mask HEENT: Turbinates erythematous with white/yellow drainage, TM normal, pharynx mildly erythematous with no tonsilar exudate or edema, minimal sinus tenderness CV: RRR no murmurs rubs or gallops Lungs: CTAB no crackles, wheeze, rhonchi Ext: no edema Skin: warm, dry, no rash Neuro: hard of hearing  Assessment/Plan:  Sinus infection/Sinusitis Bacterial based on: Symptoms >10 days, double sickening With fever and body aches swabbed for flu- flu test negative. We did discuss could be false negative but with 20 days of sinus symptoms will focus on treating that  Treatment: -symptomatic care with  Plain mucinex or mucinex- DM (if you want to have something to help with cough as well) Sinus rinses like a Neti Pot or Neilmed sinus rinse (make sure to follow instructions for water preparation Tylenol 650 mg every 6 hours to help with sinus pressure -Antibiotic indicated: yes (see below doxycyline)  Finally, we reviewed reasons to return to care including if symptoms worsen or persist  (despite above treatments) or new concerns arise (particularly fever or shortness of breath)  Meds ordered this encounter  Medications  . doxycycline (VIBRA-TABS) 100 MG tablet    Sig: Take 1 tablet (100 mg total) by mouth 2 (two) times daily for 7 days.    Dispense:  14 tablet    Refill:  0    [name], MD

## 2019-01-18 NOTE — Patient Instructions (Signed)
Sinus infection/Sinusitis Bacterial based on: Symptoms >10 days, double sickening  Treatment: -symptomatic care with  Plain mucinex or mucinex- DM (if you want to have something to help with cough as well) Sinus rinses like a Neti Pot or Neilmed sinus rinse (make sure to follow instructions for water preparation Tylenol 650 mg every 6 hours to help with sinus pressure -Antibiotic indicated: yes (see below doxycyline)  Finally, we reviewed reasons to return to care including if symptoms worsen or persist  (despite above treatments) or new concerns arise (particularly fever or shortness of breath)  Meds ordered this encounter  Medications  . doxycycline (VIBRA-TABS) 100 MG tablet    Sig: Take 1 tablet (100 mg total) by mouth 2 (two) times daily for 7 days.    Dispense:  14 tablet    Refill:  0

## 2019-01-25 ENCOUNTER — Encounter (INDEPENDENT_AMBULATORY_CARE_PROVIDER_SITE_OTHER): Payer: Medicare Other | Admitting: Ophthalmology

## 2019-01-25 DIAGNOSIS — H353231 Exudative age-related macular degeneration, bilateral, with active choroidal neovascularization: Secondary | ICD-10-CM | POA: Diagnosis not present

## 2019-01-25 DIAGNOSIS — H43813 Vitreous degeneration, bilateral: Secondary | ICD-10-CM | POA: Diagnosis not present

## 2019-01-25 DIAGNOSIS — H35033 Hypertensive retinopathy, bilateral: Secondary | ICD-10-CM | POA: Diagnosis not present

## 2019-01-25 DIAGNOSIS — I10 Essential (primary) hypertension: Secondary | ICD-10-CM

## 2019-01-28 ENCOUNTER — Encounter: Payer: Self-pay | Admitting: Family Medicine

## 2019-01-28 NOTE — Telephone Encounter (Signed)
[name], can you look into this please.

## 2019-03-01 ENCOUNTER — Other Ambulatory Visit: Payer: Self-pay

## 2019-03-01 ENCOUNTER — Other Ambulatory Visit: Payer: Self-pay | Admitting: Family Medicine

## 2019-03-01 ENCOUNTER — Encounter: Payer: Self-pay | Admitting: Family Medicine

## 2019-03-01 MED ORDER — ONETOUCH ULTRA BLUE VI STRP: ORAL_STRIP | 11 refills | Status: DC

## 2019-03-09 ENCOUNTER — Encounter: Payer: Self-pay | Admitting: Family Medicine

## 2019-03-09 ENCOUNTER — Ambulatory Visit (INDEPENDENT_AMBULATORY_CARE_PROVIDER_SITE_OTHER): Payer: Medicare Other | Admitting: Family Medicine

## 2019-03-09 VITALS — BP 134/70 | HR 76 | Temp 98.4°F | Wt 265.6 lb

## 2019-03-09 DIAGNOSIS — I10 Essential (primary) hypertension: Secondary | ICD-10-CM

## 2019-03-09 DIAGNOSIS — R0981 Nasal congestion: Secondary | ICD-10-CM

## 2019-03-09 DIAGNOSIS — R140 Abdominal distension (gaseous): Secondary | ICD-10-CM | POA: Diagnosis not present

## 2019-03-09 DIAGNOSIS — J329 Chronic sinusitis, unspecified: Secondary | ICD-10-CM

## 2019-03-09 DIAGNOSIS — K219 Gastro-esophageal reflux disease without esophagitis: Secondary | ICD-10-CM

## 2019-03-09 DIAGNOSIS — B9689 Other specified bacterial agents as the cause of diseases classified elsewhere: Secondary | ICD-10-CM

## 2019-03-09 MED ORDER — LANSOPRAZOLE 30 MG PO CPDR: 30.0000 mg | DELAYED_RELEASE_CAPSULE | Freq: Every day | ORAL | 5 refills | Status: DC

## 2019-03-09 MED ORDER — AMOXICILLIN-POT CLAVULANATE 875-125 MG PO TABS: 1.0000 | ORAL_TABLET | Freq: Two times a day (BID) | ORAL | 0 refills | Status: DC

## 2019-03-09 NOTE — Progress Notes (Signed)
Phone [phone]   Subjective:  Virtual visit via Video note This visit type was conducted due to national recommendations for restrictions regarding the COVID-19 Pandemic (e.g. social distancing).  This format is felt to be most appropriate for this patient at this time balancing risks to patient and risks to population by having him in for in person visit.  All issues noted in this document were discussed and addressed.  No physical exam was performed (except for noted visual exam or audio findings with Telehealth visits).  The patient has consented to conduct a Telehealth visit and understands insurance will be billed.   Our team/I connected with [name] on 03/09/19 at  1:40 PM EDT by a video enabled telemedicine application ([email or web address]) and verified that I am speaking with the correct person using two identifiers.  Location patient: Home[place] Location provider: [facility], office Persons participating in the virtual visit:  patient  Our team/I discussed the limitations of evaluation and management by telemedicine and the availability of in person appointments. In light of current covid-19 pandemic, patient also understands that we are trying to protect them by minimizing in office contact if at all possible.  The patient expressed consent for telemedicine visit and agreed to proceed. Patient understands insurance will be billed.   ROS-no chest pain or shortness of breath reported.  No fever reported.  Some cough.  Nasal congestion noted.  Past Medical History-  Patient Active Problem List   Diagnosis Date Noted  . Chronic bilateral low back pain without sciatica 12/12/2015    Priority: High  . Chronic lymphocytic leukemia ([place]) 05/16/2014    Priority: High  . History of prostate cancer 01/24/2014    Priority: High  . 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 Diabetic retinopathy ([place]) 09/24/2007    Priority: High  . Tremor  08/11/2015    Priority: Medium  . Hypothyroidism     Priority: Medium  . 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 Depression 09/21/2009    Priority: Medium  . Hyperlipidemia 09/24/2007    Priority: Medium  . OSA on CPAP 09/24/2007    Priority: Medium  . Essential hypertension 09/24/2007    Priority: Medium  . Personal history of skin cancer 04/02/2017    Priority: Low  . GERD (gastroesophageal reflux disease) 12/08/2014    Priority: Low  . Morbid obesity ([place]) 10/25/2008    Priority: Low  . History of colonic polyps 08/22/2008    Priority: Low  . 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 Ear pressure, left 04/14/2017  . Temporomandibular joint (TMJ) pain 04/14/2017  . Pain syndrome, chronic 02/01/2016  . 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 myelopathy or radiculopathy 02/01/2016    Medications- reviewed and updated Current Outpatient Medications  Medication Sig Dispense Refill  . amLODipine (NORVASC) 10 MG tablet Take 10 mg by mouth every morning.    . fenofibrate 160 MG tablet Take 160 mg by mouth daily.    . hydrochlorothiazide (HYDRODIURIL) 25 MG tablet Take 25 mg by mouth daily.    Marland Kitchen ibuprofen (ADVIL,MOTRIN) 600 MG tablet Take 600 mg by mouth every 6 (six) hours as needed.    . insulin aspart (NOVOLOG) 100 UNIT/ML injection Inject 45 Units into the skin 3 (three) times daily with meals. When sugar is elevated    . insulin glargine (LANTUS) 100 UNIT/ML injection Inject 102 Units into the skin at bedtime.     Marland Kitchen  lansoprazole (PREVACID) 30 MG capsule Take 1 capsule (30 mg total) by mouth daily at 12 noon. 30 capsule 5  . levothyroxine (SYNTHROID, LEVOTHROID) 50 MCG tablet Take 50 mcg by mouth daily before breakfast.     . losartan (COZAAR) 100 MG tablet Take 100 mg by mouth daily.    . meclizine (ANTIVERT) 25 MG tablet Take 25 mg by mouth every 6 (six) hours.    . mirtazapine (REMERON) 15 MG tablet Take 15 mg by mouth at bedtime.    . ONE TOUCH ULTRA TEST  test strip Test 4 times daily. Dx: E11.9 100 each 11  . OVER THE COUNTER MEDICATION PreserVision for eyes    . potassium chloride SA (K-DUR,KLOR-CON) 20 MEQ tablet Take 20 mEq by mouth daily.    . propranolol (INDERAL) 40 MG tablet Take 1 tablet (40 mg total) by mouth 2 (two) times daily. 180 tablet 3  . simvastatin (ZOCOR) 40 MG tablet Take 40 mg by mouth daily. [facility]- dose unclear but taking 1/2 tablet    . sucralfate (CARAFATE) 1 G tablet Take 1 g by mouth 4 (four) times daily -  with meals and at bedtime.    . traMADol (ULTRAM) 50 MG tablet Take 50 mg by mouth every 6 (six) hours as needed. for pain  0  . amoxicillin-clavulanate (AUGMENTIN) 875-125 MG tablet Take 1 tablet by mouth 2 (two) times daily. 20 tablet 0   No current facility-administered medications for this visit.      Objective:  BP 134/70   Pulse 76   Temp 98.4 F (36.9 C)   Wt 265 lb 9.6 oz (120.5 kg)   SpO2 94%   BMI 36.02 kg/m  Gen: NAD, resting comfortably Points up to maxillary sinuses as source of pain.  Lungs: nonlabored, normal respiratory rate  Skin: appears dry, no obvious rash Somewhat hard of hearing, loud speech likely as a result     Assessment and Plan   # Nasal congestion-likely due to sinusitis # GERD S:patient has noted some throat irritation and cough- particularly at night. Seen 2 months ago and treated for sinus infection with doxycyline- thinks he got at least 75% better and later symptoms worsened. Still having fair amount of sinus pressure. States has a lot of mucus in his throat- not sure what it looks like. No fever. neti pot helps some. Occasionally some headaches. Body aches never came back.   Stomach is somewhat unconfortable. Has phone call planned for Monday at 1:30 PM with Dr. Carlean Purl. He feels bloated- like he needs to belch and he can't. Having largely normal bowel movements daily. Taking sulcralfate intermittently- helps when he takes it.   He admits he stopped taking  Prevacid-he thought he was given the Carafate as a substitute A/P: Sounds like patient has been adequately treated sinus infection (75% improvement on antibiotic then later worsening)- we will be more aggressive and use a 10-day course of Augmentin this time.  - consider adding allergy medication like allegra of flonase if does not have more significant improvement with above plan  Also appears to have inadequately treated reflux- patient stopped taking his Prevacid-we will restart this-he wants to keep his follow-up with Dr. Carlean Purl next Monday and I am hoping he has some improvement by that time.  We discussed Carafate is typically an add-on for reflux and not the primary treatment.  With his sinus symptoms out of abundance of precaution-recommended he remain home for 3 days past resolution of symptoms per  [facility] department public health for any patient has any potential COVID-19 symptoms.  Patient agrees to do so-he is already been staying in the home and wife has been shopping for him anyway.  I will plan on checking in on him in a week  #hypertension S: controlled on amlodipine 10 mg, losartan 100 mg, hydrochlorothiazide 25 mg, and propranolol 40mg  BID on home readings today BP Readings from Last 3 Encounters:  03/09/19 134/70  01/18/19 138/72  12/28/18 134/84  A/P:  Stable. Continue current medications.  Thankfully patient not using decongestants for sinus issues   Future Appointments  Date Time Provider [place]  03/15/2019  1:30 PM Gatha Mayer, MD [facility]  03/17/2019  7:30 AM Hayden Pedro, MD [facility] None  07/07/2019  8:00 AM [name], MD [facility]  08/26/2019 10:00 AM [facility] 5 [facility] None  08/26/2019 10:30 AM Wyatt Portela, MD [facility] None   Lab/Order associations: Bacterial sinusitis  Gastroesophageal reflux disease without esophagitis  Bloating  Sinus congestion  Essential hypertension  Meds ordered this  encounter  Medications  . lansoprazole (PREVACID) 30 MG capsule    Sig: Take 1 capsule (30 mg total) by mouth daily at 12 noon.    Dispense:  30 capsule    Refill:  5  . amoxicillin-clavulanate (AUGMENTIN) 875-125 MG tablet    Sig: Take 1 tablet by mouth 2 (two) times daily.    Dispense:  20 tablet    Refill:  0    Return precautions advised.  [name], MD

## 2019-03-09 NOTE — Patient Instructions (Addendum)
Video visit

## 2019-03-15 ENCOUNTER — Encounter: Payer: Self-pay | Admitting: Internal Medicine

## 2019-03-15 ENCOUNTER — Other Ambulatory Visit: Payer: Self-pay

## 2019-03-15 ENCOUNTER — Ambulatory Visit (INDEPENDENT_AMBULATORY_CARE_PROVIDER_SITE_OTHER): Payer: Medicare Other | Admitting: Internal Medicine

## 2019-03-15 VITALS — Ht 72.0 in | Wt 265.0 lb

## 2019-03-15 DIAGNOSIS — Z9884 Bariatric surgery status: Secondary | ICD-10-CM | POA: Diagnosis not present

## 2019-03-15 DIAGNOSIS — K219 Gastro-esophageal reflux disease without esophagitis: Secondary | ICD-10-CM | POA: Diagnosis not present

## 2019-03-15 DIAGNOSIS — R1310 Dysphagia, unspecified: Secondary | ICD-10-CM

## 2019-03-15 DIAGNOSIS — R1013 Epigastric pain: Secondary | ICD-10-CM | POA: Diagnosis not present

## 2019-03-15 DIAGNOSIS — R1319 Other dysphagia: Secondary | ICD-10-CM

## 2019-03-15 NOTE — Patient Instructions (Addendum)
Good to speak to you today.  As we discussed we will contact you when the Covid-19 restrictions on procedures are lifted.  Please cut food small, chew well, eat slowly and drink liquid with food.  Take the Prevacid - it can help a lot with this.  If you get worse or have any questions before we contact you again - please call back or send a My Chart message.  I have placed you on a list of patients to call and set up an endoscopy and esophageal dilation when able to do so.  If you do not hear from [place] by June or when the news is saying that health care procedures are restarting please contact us.  I appreciate the opportunity to care for you. Gatha Mayer, MD, [facility]

## 2019-03-15 NOTE — Progress Notes (Signed)
TELEHEALTH ENCOUNTER IN SETTING OF COVID-19 PANDEMIC - REQUESTED BY PATIENT SERVICE PROVIDED BY TELEMEDECINE - TYPE: Telephone PATIENT LOCATION: Home PATIENT HAS CONSENTED TO TELEHEALTH VISIT PROVIDER LOCATION: OFFICE PARTICIPANTS OTHER THAN PATIENT:Wife TIME SPENT ON CALL: 25 minutes    [name] 78 y.o. 1941/07/15 [id number]  Assessment & Plan:   Encounter Diagnoses  Name Primary?  . Esophageal dysphagia - recurent ? from GERD, dysmotility, lap band Yes  . Gastroesophageal reflux disease, esophagitis presence not specified   . Dyspepsia   . H/O bariatric surgery - lap band     He will continue his Prevacid, I do agree it is too soon to say anything about its effectiveness but I think it can help.  I offered investigation with an upper GI series again but he declined.  The chronicity and rare occurrence of the same problems in the absence of unintentional weight loss and multiple negative upper endoscopies is reassuring though certainly cannot exclude any new upper GI tract neoplasm makes that much less likely.  I do not think it is appropriate to put him on the schedule for an urgent upper endoscopy and dilation in the setting of the COVID-19 pandemic procedure restrictions.  He is comfortable with this plan and will contact us if things change for the worse.  Otherwise I will put him on our call list for signs and symptoms procedure in the [facility]    Subjective:   Chief Complaint: Dysphagia and abdominal discomfort  HPI The patient is a 78 year old white man with a long history of reflux, esophageal dysphagia and multiple upper endoscopy and dilation procedures over the years dating back to when Dr. Velora Heckler was caring for him.  He reports "a long time" of recurrent dysphagia.  Intermittent episodic solid food dysphagia with a midsternal sticking point as he has had in the past.  I inherited his care from Dr. Velora Heckler and a performed at least 2 upper  endoscopies in 2018 in 2015.  He also has sort of a postprandial burning upper abdominal pain which is something he has had before as well.  EGD/dili last 2018 - tortuous esophagus - 54 Fr malone - slight heme in esophagus no tears 2015 54 Fr Maloney - some lap band impression   He reports that the dilation will help for a "good while" but the problems come back.  Somewhere along the way he stopped taking his Prevacid, he said that when he had his lap band surgery with Dr. Hassell Done and hiatal hernia repair at the same time his heartburn disappeared though it has been coming back.  He saw Dr. Yong Channel of primary care earlier this month and was told to restart his Prevacid and is done so but has not noticed much difference yet but he thinks it has not been on long enough to help.  He uses intermittent Carafate.  He also describes an intermittent achy or pressure-like upper abdominal pain that happens after meals.  There is no unintentional or intentional weight loss at this time.  The symptoms are very similar to what he has had off and on over the years.  His first EGD in 1985 showed reflux esophagitis gastritis and a 4 cm hiatal hernia. I first met him in 2009 and he was having intermittent dysphagia then.  His dysphagia seemed to worsen after lap band placement in 2012.  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  Gastric band luminal David was 1.3 cm at that time. Bowel habits are regular without change.  He is up-to-date on colonoscopy screening.  There was a 10 mm inflammatory sigmoid polyp removed in 2018 and his age and those findings no recall colonoscopy was necessary.  Review of systems.  He has been having recurrent sinusitis and postnasal drip he was improved by the use of doxycycline earlier in the year and has recently started a course of Augmentin and feels like that is getting better even more so.  Also has seasonal  allergy issues.  He has been staying in his much as possible he has retinal injections with Dr. Zigmund Daniel coming soon for his macular degeneration and is a bit anxious about leaving the house and going to a medical office.  He will wear a mask and gloves he says.  Wt Readings from Last 3 Encounters:  03/15/19 265 lb (120.2 kg)  03/09/19 265 lb 9.6 oz (120.5 kg)  01/18/19 265 lb 9.6 oz (120.5 kg)    Allergies  Allergen Reactions  . Morphine Sulfate Itching and Rash   Current Meds  Medication Sig  . amLODipine (NORVASC) 10 MG tablet Take 10 mg by mouth every morning.  Marland Kitchen amoxicillin-clavulanate (AUGMENTIN) 875-125 MG tablet Take 1 tablet by mouth 2 (two) times daily.  . fenofibrate 160 MG tablet Take 160 mg by mouth daily.  . hydrochlorothiazide (HYDRODIURIL) 25 MG tablet Take 25 mg by mouth daily.  Marland Kitchen ibuprofen (ADVIL,MOTRIN) 600 MG tablet Take 600 mg by mouth every 6 (six) hours as needed.  . insulin aspart (NOVOLOG) 100 UNIT/ML injection Inject 45 Units into the skin 3 (three) times daily with meals. When sugar is elevated  . insulin glargine (LANTUS) 100 UNIT/ML injection Inject 102 Units into the skin at bedtime.   . lansoprazole (PREVACID) 30 MG capsule Take 1 capsule (30 mg total) by mouth daily at 12 noon.  Marland Kitchen levothyroxine (SYNTHROID, LEVOTHROID) 50 MCG tablet Take 50 mcg by mouth daily before breakfast.   . losartan (COZAAR) 100 MG tablet Take 100 mg by mouth daily.  . meclizine (ANTIVERT) 25 MG tablet Take 25 mg by mouth every 6 (six) hours.  . mirtazapine (REMERON) 15 MG tablet Take 15 mg by mouth at bedtime.  . ONE TOUCH ULTRA TEST test strip Test 4 times daily. Dx: E11.9  . OVER THE COUNTER MEDICATION PreserVision for eyes  . potassium chloride SA (K-DUR,KLOR-CON) 20 MEQ tablet Take 20 mEq by mouth daily.  . propranolol (INDERAL) 40 MG tablet Take 1 tablet (40 mg total) by mouth 2 (two) times daily.  . simvastatin (ZOCOR) 40 MG tablet Take 40 mg by mouth daily. Through [facility]- dose  unclear but taking 1/2 tablet  . sucralfate (CARAFATE) 1 G tablet Take 1 g by mouth 4 (four) times daily -  with meals and at bedtime.  . traMADol (ULTRAM) 50 MG tablet Take 50 mg by mouth every 6 (six) hours as needed. for pain   Past Medical History:  Diagnosis Date  . Arthritis   . Colon polyps   . DDD (degenerative disc disease), lumbar   . Depression    PTSD  . Diabetes mellitus   . Diabetic retinopathy   . Diverticulosis of colon   . Elevated PSA   . ERECTILE DYSFUNCTION 12/30/2007   No rx.     Marland Kitchen GERD (gastroesophageal reflux disease)   . Hyperlipidemia   . Hypertension   . Hypothyroidism   . IBS (irritable bowel syndrome)   .  Leukemia ([place]) 01/2014  . Macular degeneration    followed by ophthalmology  . OSA (obstructive sleep apnea)   . PONV (postoperative nausea and vomiting)   . Prostate cancer ([place])   . PTSD (post-traumatic stress disorder)    [facility] by [facility]   Past Surgical History:  Procedure Laterality Date  . CHOLECYSTECTOMY    . COLONOSCOPY  03/25/12, 09/28/08  . ESOPHAGOGASTRODUODENOSCOPY N/A 10/12/2014   Procedure: ESOPHAGOGASTRODUODENOSCOPY (EGD);  Surgeon: Gatha Mayer, MD;  Location: [facility];  Service: Endoscopy;  Laterality: N/A;  . ESOPHAGOGASTRODUODENOSCOPY ENDOSCOPY  09/28/08  . FOOT SURGERY Bilateral   . HEMORRHOID SURGERY    . LAPAROSCOPIC GASTRIC BANDING  03/05/11   weight loss  . LAPAROSCOPIC GASTRIC BANDING WITH HIATAL HERNIA REPAIR  03/05/2011  . LYMPHADENECTOMY Bilateral 01/24/2014   Procedure: LYMPHADENECTOMY;  Surgeon: Dutch Gray, MD;  Location: [facility];  Service: Urology;  Laterality: Bilateral;  . ORIF TIBIA FRACTURE Right   . PENILE PROSTHESIS IMPLANT    . PROSTATE SURGERY  01/2014  . ROBOT ASSISTED LAPAROSCOPIC RADICAL PROSTATECTOMY N/A 01/24/2014   Procedure: ROBOTIC ASSISTED LAPAROSCOPIC RADICAL PROSTATECTOMY LEVEL 3;  Surgeon: Dutch Gray, MD;  Location: [facility];  Service: Urology;  Laterality: N/A;  . SHOULDER SURGERY Right 2011  .  TONSILLECTOMY     age 77  . VASECTOMY     Social History   Social History Narrative   Married. 3 children from previous marriage, 2 step children. 15 grandkids.       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   Review of Systems As per HPI

## 2019-03-16 DIAGNOSIS — G894 Chronic pain syndrome: Secondary | ICD-10-CM | POA: Diagnosis not present

## 2019-03-17 ENCOUNTER — Encounter (INDEPENDENT_AMBULATORY_CARE_PROVIDER_SITE_OTHER): Payer: Medicare Other | Admitting: Ophthalmology

## 2019-03-17 ENCOUNTER — Other Ambulatory Visit: Payer: Self-pay

## 2019-03-17 ENCOUNTER — Encounter: Payer: Self-pay | Admitting: Family Medicine

## 2019-03-17 DIAGNOSIS — I10 Essential (primary) hypertension: Secondary | ICD-10-CM | POA: Diagnosis not present

## 2019-03-17 DIAGNOSIS — H353231 Exudative age-related macular degeneration, bilateral, with active choroidal neovascularization: Secondary | ICD-10-CM

## 2019-03-17 DIAGNOSIS — H43813 Vitreous degeneration, bilateral: Secondary | ICD-10-CM | POA: Diagnosis not present

## 2019-03-17 DIAGNOSIS — H35033 Hypertensive retinopathy, bilateral: Secondary | ICD-10-CM

## 2019-03-29 ENCOUNTER — Other Ambulatory Visit: Payer: Self-pay | Admitting: *Deleted

## 2019-03-30 ENCOUNTER — Telehealth: Payer: Self-pay | Admitting: *Deleted

## 2019-03-30 NOTE — Telephone Encounter (Signed)
Called patient to schedule procedure per Dr. Celesta Aver recommendations. Scheduled endoscopy with wife for 04/01/19 at 1130 am. Wife aware to arrive at 1030 am. No solid foods, only clear liquids after midnight and nothing by mouth after 830 am. Instructed only 1/2 of dose of long acting insulin and no regular insulin the evening before procedure. The day of procedure no long acting or regular insulin at all. Wife reports understanding.

## 2019-03-31 ENCOUNTER — Telehealth: Payer: Self-pay | Admitting: *Deleted

## 2019-03-31 NOTE — Telephone Encounter (Signed)
Left message on cell phone to see if pt can come in at 830 am for a 930 am procedure tomorrow.

## 2019-03-31 NOTE — Telephone Encounter (Signed)
Unable to reach pt

## 2019-04-01 ENCOUNTER — Encounter: Payer: Self-pay | Admitting: Internal Medicine

## 2019-04-01 ENCOUNTER — Ambulatory Visit (AMBULATORY_SURGERY_CENTER): Payer: Medicare Other | Admitting: Internal Medicine

## 2019-04-01 ENCOUNTER — Other Ambulatory Visit: Payer: Self-pay

## 2019-04-01 VITALS — BP 135/75 | HR 51 | Temp 98.8°F | Resp 23 | Ht 72.0 in | Wt 265.0 lb

## 2019-04-01 DIAGNOSIS — R1310 Dysphagia, unspecified: Secondary | ICD-10-CM | POA: Diagnosis not present

## 2019-04-01 DIAGNOSIS — R1319 Other dysphagia: Secondary | ICD-10-CM

## 2019-04-01 MED ORDER — SODIUM CHLORIDE 0.9 % IV SOLN: 500.0000 mL | Freq: Once | INTRAVENOUS | Status: DC

## 2019-04-01 NOTE — Patient Instructions (Addendum)
There was slight irritation or inflammation in the stomach only, No ulcers, no signs of cancer.  I stretched the esophagus to see if that helps.  Stay on the Prevacid (lansoprazole).  Give it some time and if no better in a month let me know.  I appreciate the opportunity to care for you. Gatha Mayer, MD, [facility]    HANDOUTS GIVEN FOR GASTRITIS, POST DILATION DIET, STRICTURE.  YOU HAD AN ENDOSCOPIC PROCEDURE TODAY AT [facility]:   Refer to the procedure report that was given to you for any specific questions about what was found during the examination.  If the procedure report does not answer your questions, please call your gastroenterologist to clarify.  If you requested that your care partner not be given the details of your procedure findings, then the procedure report has been included in a sealed envelope for you to review at your convenience later.  YOU SHOULD EXPECT: Some feelings of bloating in the abdomen. Passage of more gas than usual.  Walking can help get rid of the air that was put into your GI tract during the procedure and reduce the bloating. If you had a lower endoscopy (such as a colonoscopy or flexible sigmoidoscopy) you may notice spotting of blood in your stool or on the toilet paper. If you underwent a bowel prep for your procedure, you may not have a normal bowel movement for a few days.  Please Note:  You might notice some irritation and congestion in your nose or some drainage.  This is from the oxygen used during your procedure.  There is no need for concern and it should clear up in a day or so.  SYMPTOMS TO REPORT IMMEDIATELY:    Following upper endoscopy (EGD)  Vomiting of blood or coffee ground material  New chest pain or pain under the shoulder blades  Painful or persistently difficult swallowing  New shortness of breath  Fever of 100F or higher  Black, tarry-looking stools  For urgent or emergent issues, a  gastroenterologist can be reached at any hour by calling [phone].   DIET:  SEE POST DILATION DIET HANDOUT.    Drink plenty of fluids but you should avoid alcoholic beverages for 24 hours.  ACTIVITY:  You should plan to take it easy for the rest of today and you should NOT DRIVE or use heavy machinery until tomorrow (because of the sedation medicines used during the test).    FOLLOW UP: Our staff will call the number listed on your records the next business day following your procedure to check on you and address any questions or concerns that you may have regarding the information given to you following your procedure. If we do not reach you, we will leave a message.  However, if you are feeling well and you are not experiencing any problems, there is no need to return our call.  We will assume that you have returned to your regular daily activities without incident.  If any biopsies were taken you will be contacted by phone or by letter within the next 1-3 weeks.  Please call us at [phone] if you have not heard about the biopsies in 3 weeks.    SIGNATURES/CONFIDENTIALITY: You and/or your care partner have signed paperwork which will be entered into your electronic medical record.  These signatures attest to the fact that that the information above on your After Visit Summary has been reviewed and is understood.  Full responsibility  of the confidentiality of this discharge information lies with you and/or your care-partner.

## 2019-04-01 NOTE — Op Note (Signed)
[facility] Patient Name: [name] Procedure Date: 04/01/2019 9:39 AM MRN: [id number] Endoscopist: Gatha Mayer , MD Age: 78 Referring MD:  Date of Birth: 1941-07-28 Gender: Male Account #: [id number] Procedure:                Upper GI endoscopy Indications:              Dysphagia Medicines:                Propofol per Anesthesia, Monitored Anesthesia Care Procedure:                Pre-Anesthesia Assessment:                           - Prior to the procedure, a History and Physical                            was performed, and patient medications and                            allergies were reviewed. The patient's tolerance of                            previous anesthesia was also reviewed. The risks                            and benefits of the procedure and the sedation                            options and risks were discussed with the patient.                            All questions were answered, and informed consent                            was obtained. Prior Anticoagulants: The patient has                            taken no previous anticoagulant or antiplatelet                            agents. ASA Grade Assessment: III - A patient with                            severe systemic disease. After reviewing the risks                            and benefits, the patient was deemed in                            satisfactory condition to undergo the procedure.                           After obtaining informed consent, the endoscope was  passed under direct vision. Throughout the                            procedure, the patient's blood pressure, pulse, and                            oxygen saturations were monitored continuously. The                            Model GIF-[id number]) scope was introduced                            through the mouth, and advanced to the second part                            of duodenum. The  upper GI endoscopy was                            accomplished without difficulty. The patient                            tolerated the procedure well. Scope In: Scope Out: Findings:                 The examined esophagus was normal.                           Patchy mildly erythematous mucosa without bleeding                            was found in the prepyloric region of the stomach.                           The exam was otherwise without abnormality.                           The scope was withdrawn. Dilation was performed in                            the entire esophagus with a Maloney dilator with                            mild resistance at 54 Fr. Estimated blood loss:                            none. Complications:            No immediate complications. Estimated Blood Loss:     Estimated blood loss: none. Impression:               - Normal esophagus.                           - Erythematous mucosa in the prepyloric region of  the stomach.                           - The examination was otherwise normal. No signs of                            lap band impingement - believe deflated                           - Dilation performed in the entire esophagus.                           - No specimens collected. Recommendation:           - Patient has a contact number available for                            emergencies. The signs and symptoms of potential                            delayed complications were discussed with the                            patient. Return to normal activities tomorrow.                            Written discharge instructions were provided to the                            patient.                           - Clear liquids x 1 hour then soft foods rest of                            day. Start prior diet tomorrow.                           - Continue present medications.                           - Stay on PPI                            Give this some time and if no better in 1 month                            call back Gatha Mayer, MD 04/01/2019 10:04:06 AM This report has been signed electronically.

## 2019-04-01 NOTE — Progress Notes (Signed)
Called to room to assist during endoscopic procedure.  Patient ID and intended procedure confirmed with present staff. Received instructions for my participation in the procedure from the performing physician.  

## 2019-04-01 NOTE — Progress Notes (Signed)
PT taken to PACU. Monitors in place. VSS. Report given to RN. 

## 2019-04-05 ENCOUNTER — Telehealth: Payer: Self-pay | Admitting: *Deleted

## 2019-04-05 DIAGNOSIS — M545 Low back pain: Secondary | ICD-10-CM | POA: Diagnosis not present

## 2019-04-05 DIAGNOSIS — M47816 Spondylosis without myelopathy or radiculopathy, lumbar region: Secondary | ICD-10-CM | POA: Diagnosis not present

## 2019-04-05 DIAGNOSIS — M533 Sacrococcygeal disorders, not elsewhere classified: Secondary | ICD-10-CM | POA: Diagnosis not present

## 2019-04-05 DIAGNOSIS — M7918 Myalgia, other site: Secondary | ICD-10-CM | POA: Diagnosis not present

## 2019-04-05 NOTE — Telephone Encounter (Signed)
  Follow up Call-  Call back number 04/01/2019 02/26/2017  Post procedure Call Back phone  # [phone]  Permission to leave phone message Yes Yes  Some recent data might be hidden     Patient questions:  Do you have a fever, pain , or abdominal swelling? No. Pain Score  0 *  Have you tolerated food without any problems? Yes.    Have you been able to return to your normal activities? Yes.    Do you have any questions about your discharge instructions: Diet   No. Medications  No. Follow up visit  No.  Do you have questions or concerns about your Care? No.  Actions: * If pain score is 4 or above: No action needed, pain <4.

## 2019-04-13 ENCOUNTER — Telehealth: Payer: Self-pay | Admitting: *Deleted

## 2019-04-13 NOTE — Telephone Encounter (Signed)
LMOM to follow up with covid post procedure questions

## 2019-04-13 NOTE — Telephone Encounter (Signed)
1. Have you developed a fever since your procedure? no  2.   Have you had an respiratory symptoms (SOB or cough) since your procedure? no  3.   Have you tested positive for COVID 19 since your procedure no  3.   Have you had any family members/close contacts diagnosed with the COVID 19 since your procedure?  no   If any of these questions are a yes, please inquire if patient has been seen by family doctor and route this note to Tracy Walton, RN.  

## 2019-04-17 ENCOUNTER — Encounter: Payer: Self-pay | Admitting: Family Medicine

## 2019-04-20 ENCOUNTER — Encounter: Payer: Self-pay | Admitting: Family Medicine

## 2019-04-20 ENCOUNTER — Ambulatory Visit (INDEPENDENT_AMBULATORY_CARE_PROVIDER_SITE_OTHER): Payer: Medicare Other | Admitting: Family Medicine

## 2019-04-20 DIAGNOSIS — E538 Deficiency of other specified B group vitamins: Secondary | ICD-10-CM

## 2019-04-20 MED ORDER — CYANOCOBALAMIN 1000 MCG/ML IJ SOLN
1000.0000 ug | Freq: Once | INTRAMUSCULAR | Status: AC
  Administered 2019-04-20: 1000 ug via INTRAMUSCULAR

## 2019-04-20 NOTE — Progress Notes (Signed)
Per orders of Dr. Yong Channel, injection of B12 given in Right deltoid by Franco Collet. Patient tolerated injection well.  I have reviewed and agree with note, evaluation, plan.   [name], MD

## 2019-04-20 NOTE — Patient Instructions (Signed)
There are no preventive care reminders to display for this patient.  Depression screen [facility] 2/9 12/28/2018 06/25/2018 12/18/2017  Decreased Interest 0 0 0  Down, Depressed, Hopeless 0 0 0  PHQ - 2 Score 0 0 0  Altered sleeping - 0 -  Tired, decreased energy - 2 -  Change in appetite - 0 -  Feeling bad or failure about yourself  - 2 -  Trouble concentrating - 0 -  Moving slowly or fidgety/restless - 0 -  Suicidal thoughts - 0 -  PHQ-9 Score - 4 -  Difficult doing work/chores - Not difficult at all -  Some recent data might be hidden

## 2019-04-22 ENCOUNTER — Encounter: Payer: Self-pay | Admitting: Family Medicine

## 2019-04-23 NOTE — Telephone Encounter (Signed)
Pt notified of update. Pt will stop by the office next week to pick up Rx.

## 2019-04-26 ENCOUNTER — Encounter: Payer: Self-pay | Admitting: Family Medicine

## 2019-05-05 ENCOUNTER — Encounter (INDEPENDENT_AMBULATORY_CARE_PROVIDER_SITE_OTHER): Payer: Medicare Other | Admitting: Ophthalmology

## 2019-05-05 ENCOUNTER — Other Ambulatory Visit: Payer: Self-pay

## 2019-05-05 DIAGNOSIS — H43813 Vitreous degeneration, bilateral: Secondary | ICD-10-CM | POA: Diagnosis not present

## 2019-05-05 DIAGNOSIS — I10 Essential (primary) hypertension: Secondary | ICD-10-CM | POA: Diagnosis not present

## 2019-05-05 DIAGNOSIS — H35033 Hypertensive retinopathy, bilateral: Secondary | ICD-10-CM

## 2019-05-05 DIAGNOSIS — H35372 Puckering of macula, left eye: Secondary | ICD-10-CM | POA: Diagnosis not present

## 2019-05-05 DIAGNOSIS — H353231 Exudative age-related macular degeneration, bilateral, with active choroidal neovascularization: Secondary | ICD-10-CM

## 2019-05-13 ENCOUNTER — Ambulatory Visit (INDEPENDENT_AMBULATORY_CARE_PROVIDER_SITE_OTHER): Payer: Medicare Other | Admitting: Podiatry

## 2019-05-13 ENCOUNTER — Encounter: Payer: Self-pay | Admitting: Podiatry

## 2019-05-13 ENCOUNTER — Other Ambulatory Visit: Payer: Self-pay

## 2019-05-13 VITALS — Temp 98.2°F

## 2019-05-13 DIAGNOSIS — E0843 Diabetes mellitus due to underlying condition with diabetic autonomic (poly)neuropathy: Secondary | ICD-10-CM | POA: Diagnosis not present

## 2019-05-13 DIAGNOSIS — B351 Tinea unguium: Secondary | ICD-10-CM | POA: Diagnosis not present

## 2019-05-13 DIAGNOSIS — M79676 Pain in unspecified toe(s): Secondary | ICD-10-CM

## 2019-05-13 NOTE — Progress Notes (Signed)
He presents today with a chief concern of a black spot that was forming on the bottom of his foot over the past several weeks.  He states that it was there yesterday but seems to be gone today.  He is also concerned about his length of nails and had he would like to have them trimmed.  He is a type II diabetic and his last A1c was at 7.7 back in January.  Objective: Vital signs are stable alert oriented x3.  Pulses are palpable.  Moderate edema about the bilateral foot no open lesions or wounds noted black lesion to the plantar foot.  Toenails are long thick yellow dystrophic clinically mycotic.  Assessment: Pain limb secondary to onychomycosis.  Plan: Debridement of toenails 1 through 5 bilateral.

## 2019-05-20 DIAGNOSIS — M533 Sacrococcygeal disorders, not elsewhere classified: Secondary | ICD-10-CM | POA: Diagnosis not present

## 2019-05-26 ENCOUNTER — Encounter: Payer: Self-pay | Admitting: Family Medicine

## 2019-06-01 LAB — HEPATIC FUNCTION PANEL
ALT: 43 — AB (ref 10–40)
AST: 29 (ref 14–40)

## 2019-06-01 LAB — LIPID PANEL
Cholesterol: 135 (ref 0–200)
HDL: 54 (ref 35–70)
LDL Cholesterol: 10
Triglycerides: 357 — AB (ref 40–160)

## 2019-06-01 LAB — BASIC METABOLIC PANEL
Glucose: 240
Potassium: 3.9 (ref 3.4–5.3)
Sodium: 138 (ref 137–147)

## 2019-06-01 LAB — PSA: PSA: 0.01

## 2019-06-01 LAB — CBC AND DIFFERENTIAL
HCT: 46 (ref 41–53)
Hemoglobin: 15.4 (ref 13.5–17.5)
Platelets: 269 (ref 150–399)
WBC: 16.4

## 2019-06-01 LAB — VITAMIN B12: Vitamin B-12: 379

## 2019-06-01 LAB — HEMOGLOBIN A1C: Hemoglobin A1C: 7.9

## 2019-06-01 LAB — VITAMIN D 25 HYDROXY (VIT D DEFICIENCY, FRACTURES): Vit D, 25-Hydroxy: 19.85

## 2019-06-02 ENCOUNTER — Encounter: Payer: Self-pay | Admitting: Family Medicine

## 2019-06-03 NOTE — Telephone Encounter (Signed)
Called and scheduled pt for NUR visit B12 inj 06/23/19.

## 2019-06-10 ENCOUNTER — Encounter: Payer: Self-pay | Admitting: Family Medicine

## 2019-06-11 ENCOUNTER — Encounter: Payer: Self-pay | Admitting: Family Medicine

## 2019-06-15 ENCOUNTER — Encounter: Payer: Self-pay | Admitting: Family Medicine

## 2019-06-17 ENCOUNTER — Ambulatory Visit (INDEPENDENT_AMBULATORY_CARE_PROVIDER_SITE_OTHER): Payer: Medicare Other | Admitting: Family Medicine

## 2019-06-17 ENCOUNTER — Encounter: Payer: Self-pay | Admitting: Family Medicine

## 2019-06-17 VITALS — BP 136/67 | HR 62 | Ht 72.0 in | Wt 266.0 lb

## 2019-06-17 DIAGNOSIS — Z794 Long term (current) use of insulin: Secondary | ICD-10-CM

## 2019-06-17 DIAGNOSIS — E785 Hyperlipidemia, unspecified: Secondary | ICD-10-CM

## 2019-06-17 DIAGNOSIS — E11319 Type 2 diabetes mellitus with unspecified diabetic retinopathy without macular edema: Secondary | ICD-10-CM | POA: Diagnosis not present

## 2019-06-17 DIAGNOSIS — E1169 Type 2 diabetes mellitus with other specified complication: Secondary | ICD-10-CM | POA: Diagnosis not present

## 2019-06-17 DIAGNOSIS — K219 Gastro-esophageal reflux disease without esophagitis: Secondary | ICD-10-CM

## 2019-06-17 DIAGNOSIS — E034 Atrophy of thyroid (acquired): Secondary | ICD-10-CM | POA: Diagnosis not present

## 2019-06-17 DIAGNOSIS — Z6835 Body mass index (BMI) 35.0-35.9, adult: Secondary | ICD-10-CM | POA: Diagnosis not present

## 2019-06-17 NOTE — Progress Notes (Signed)
Message sent to admin for scheduling.

## 2019-06-17 NOTE — Patient Instructions (Signed)
There are no preventive care reminders to display for this patient.  Depression screen [facility] 2/9 12/28/2018 06/25/2018 12/18/2017  Decreased Interest 0 0 0  Down, Depressed, Hopeless 0 0 0  PHQ - 2 Score 0 0 0  Altered sleeping - 0 -  Tired, decreased energy - 2 -  Change in appetite - 0 -  Feeling bad or failure about yourself  - 2 -  Trouble concentrating - 0 -  Moving slowly or fidgety/restless - 0 -  Suicidal thoughts - 0 -  PHQ-9 Score - 4 -  Difficult doing work/chores - Not difficult at all -  Some recent data might be hidden

## 2019-06-17 NOTE — Progress Notes (Signed)
Phone [phone]   Subjective:  Virtual visit via Video note. Chief complaint: Chief Complaint  Patient presents with  . Follow-up  . Diabetes  . Hypertension  . Hyperlipidemia  . Hypothyroidism   This visit type was conducted due to national recommendations for restrictions regarding the COVID-19 Pandemic (e.g. social distancing).  This format is felt to be most appropriate for this patient at this time balancing risks to patient and risks to population by having him in for in person visit.  No physical exam was performed (except for noted visual exam or audio findings with Telehealth visits).    Our team/I connected with [name] at  4:20 PM EDT by a video enabled telemedicine application (doxy.me or caregility through epic) and verified that I am speaking with the correct person using two identifiers.  Location patient: [place] Location provider: [facility], office Persons participating in the virtual visit:  patient  Our team/I discussed the limitations of evaluation and management by telemedicine and the availability of in person appointments. In light of current covid-19 pandemic, patient also understands that we are trying to protect them by minimizing in office contact if at all possible.  The patient expressed consent for telemedicine visit and agreed to proceed. Patient understands insurance will be billed.   ROS-  Denies HA, dizziness, CP, SOB (other than not doing well in the heat), visual changes.    Past Medical History-  Patient Active Problem List   Diagnosis Date Noted  . Chronic bilateral low back pain without sciatica 12/12/2015    Priority: High  . Chronic lymphocytic leukemia ([place]) 05/16/2014    Priority: High  . History of prostate cancer 01/24/2014    Priority: High  . 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 Diabetic retinopathy ([place]) 09/24/2007    Priority: High  . Tremor 08/11/2015   Priority: Medium  . Hypothyroidism     Priority: Medium  . 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 Depression 09/21/2009    Priority: Medium  . Hyperlipidemia associated with type 2 diabetes mellitus ([place]) 09/24/2007    Priority: Medium  . OSA on CPAP 09/24/2007    Priority: Medium  . Hypertension associated with diabetes ([place]) 09/24/2007    Priority: Medium  . Personal history of skin cancer 04/02/2017    Priority: Low  . GERD (gastroesophageal reflux disease) 12/08/2014    Priority: Low  . Morbid obesity ([place]) 10/25/2008    Priority: Low  . History of colonic polyps 08/22/2008    Priority: Low  . 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 Ear pressure, left 04/14/2017  . Temporomandibular joint (TMJ) pain 04/14/2017  . Pain syndrome, chronic 02/01/2016  . 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 myelopathy or radiculopathy 02/01/2016    Medications- reviewed and updated Current Outpatient Medications  Medication Sig Dispense Refill  . amLODipine (NORVASC) 10 MG tablet Take 10 mg by mouth every morning.    . fenofibrate 160 MG tablet Take 160 mg by mouth daily.    . hydrochlorothiazide (HYDRODIURIL) 25 MG tablet Take 25 mg by mouth daily.    Marland Kitchen ibuprofen (ADVIL,MOTRIN) 600 MG tablet Take 600 mg by mouth every 6 (six) hours as needed.    . insulin aspart (NOVOLOG) 100 UNIT/ML injection Inject 45 Units into the skin 3 (three) times daily with meals. When sugar is elevated    . insulin glargine (LANTUS) 100 UNIT/ML injection Inject 102  Units into the skin at bedtime.     . lansoprazole (PREVACID) 30 MG capsule Take 1 capsule (30 mg total) by mouth daily at 12 noon. 30 capsule 5  . levothyroxine (SYNTHROID, LEVOTHROID) 50 MCG tablet Take 50 mcg by mouth daily before breakfast.     . losartan (COZAAR) 100 MG tablet Take 100 mg by mouth daily.    . meclizine (ANTIVERT) 25 MG tablet Take 25 mg by mouth every 6 (six) hours.    . mirtazapine (REMERON) 15 MG tablet Take  15 mg by mouth at bedtime.    . ONE TOUCH ULTRA TEST test strip Test 4 times daily. Dx: E11.9 100 each 11  . OVER THE COUNTER MEDICATION PreserVision for eyes    . potassium chloride SA (K-DUR,KLOR-CON) 20 MEQ tablet Take 20 mEq by mouth daily.    . propranolol (INDERAL) 40 MG tablet Take 1 tablet (40 mg total) by mouth 2 (two) times daily. 180 tablet 3  . simvastatin (ZOCOR) 40 MG tablet Take 40 mg by mouth daily. [facility]- dose unclear but taking 1/2 tablet    . sucralfate (CARAFATE) 1 G tablet Take 1 g by mouth 4 (four) times daily -  with meals and at bedtime.    . traMADol (ULTRAM) 50 MG tablet Take 50 mg by mouth every 6 (six) hours as needed. for pain  0   No current facility-administered medications for this visit.      Objective:  BP (!) 160/80   Pulse 62   Ht 6' (1.829 m)   Wt 266 lb (120.7 kg)   BMI 36.08 kg/m  self reported vitals Gen: NAD, resting comfortably Lungs: nonlabored, normal respiratory rate  Skin: appears dry, no obvious rash    Assessment and Plan   # Diabetes S: mild poorly controlled on Novolog 45 units TID  and Lantus 102 units qhs. He was recently on steroid so he has been taking 60 units of Novolog.  CBGs- He has not been able to check BG at home because his hands have been shaking too much to use it. His last A1C at the [place] the end of June was 7.9 % Denies hypoglycemic episodes.  Exercise and diet- Limiting sugar and carb intake. Not exercising regularly, but he does what he can.  Lab Results  Component Value Date   HGBA1C 7.7 (H) 12/28/2018   HGBA1C 7.7 (A) 07/24/2018   HGBA1C 7.0 (A) 06/25/2018   A/P: [facility] ([facility] due to recent [facility]). We will continue current medicines and try to stay at least under 8 if not under 7.5 as long as no low blood sugar - wants freestyle libre- will sign forms if receive (trouble checking due to tremor)  -diabetic retinopathy- continues to follow up with optho  #hypertension S:  controlled on Amlodipine 10 mg, Losartan 100 mg, and HCT 25 mg with Potassium 20 MEQ daily.Checks BP at home occasionally. Its stays around 140s/80s.  BP Readings from Last 3 Encounters:  06/17/19 (!) 160/80  04/01/19 135/75  03/09/19 134/70  A/P: high today when checked with wife- she is not available right now but they will recheck today or tomorrow and let us know- if above 140/90 we may need to adjust medications  #hyperlipidemia S:  controlled on Simvastatin 40 mg 1/2 tablet daily and Fenofibrate 160 mg daily.  Lab Results  Component Value Date   CHOL 117 07/24/2018   HDL 47 07/24/2018   LDLCALC 95 07/24/2018  LDLDIRECT 49.0 12/28/2018   TRIG 379 (A) 07/24/2018   CHOLHDL 2 07/16/2017   A/P: reasonable control- continue current meds  #hypothyroidism S: On thyroid medication-Levothyroxine 50 mcg daily.  A/P: recently [facility] at [facility] and was normal- continue current dose  # GERD S:Taking Lansoprazole 30 mg daily and Carafate QID. wel controlled A/P: Stable. Continue current medications.    # Obesity- morbid due to bmi over 22 with HTN, HLD, diabetes S: has tried to cut down on food intake overtime. Not exercising Wt Readings from Last 3 Encounters:  06/17/19 266 lb (120.7 kg)  04/01/19 265 lb (120.2 kg)  03/15/19 265 lb (120.2 kg)  A/P: poor control of weight- Encouraged need for healthy eating, regular exercise, weight loss. Could try chair exercises like up downs.   Recommended follow up: 6 months Future Appointments  Date Time Provider [place]  06/23/2019  7:45 AM Hayden Pedro, MD [facility] None  06/23/2019  9:30 AM [facility] NURSE [facility] PEC  07/07/2019  8:00 AM [name], MD [facility] PEC  08/26/2019 10:00 AM [facility] LAB 5 [facility] None  08/26/2019 10:30 AM Shadad, Mathis Dad, MD [facility] None    Lab/Order associations: he will send me a copy of bloodwork from [place]   1. Type 2 diabetes mellitus with retinopathy of both eyes, with  long-term current use of insulin, macular edema presence unspecified, unspecified retinopathy severity ([place])  E11.319    Z79.4   2. Hyperlipidemia associated with type 2 diabetes mellitus ([place])  E11.69    E78.5   3. Hypothyroidism due to acquired atrophy of thyroid  E03.4   4. Gastroesophageal reflux disease without esophagitis  K21.9   5. Morbid obesity ([place])  E66.01   6. Severe obesity (BMI 35.0-35.9 with comorbidity) ([place])  E66.01    Z68.35    Return precautions advised.  [name], MD

## 2019-06-23 ENCOUNTER — Encounter (INDEPENDENT_AMBULATORY_CARE_PROVIDER_SITE_OTHER): Payer: Medicare Other | Admitting: Ophthalmology

## 2019-06-23 ENCOUNTER — Encounter: Payer: Self-pay | Admitting: Family Medicine

## 2019-06-23 ENCOUNTER — Ambulatory Visit (INDEPENDENT_AMBULATORY_CARE_PROVIDER_SITE_OTHER): Payer: Medicare Other

## 2019-06-23 ENCOUNTER — Other Ambulatory Visit: Payer: Self-pay

## 2019-06-23 DIAGNOSIS — H353231 Exudative age-related macular degeneration, bilateral, with active choroidal neovascularization: Secondary | ICD-10-CM

## 2019-06-23 DIAGNOSIS — H43813 Vitreous degeneration, bilateral: Secondary | ICD-10-CM

## 2019-06-23 DIAGNOSIS — I10 Essential (primary) hypertension: Secondary | ICD-10-CM | POA: Diagnosis not present

## 2019-06-23 DIAGNOSIS — H35372 Puckering of macula, left eye: Secondary | ICD-10-CM | POA: Diagnosis not present

## 2019-06-23 DIAGNOSIS — E538 Deficiency of other specified B group vitamins: Secondary | ICD-10-CM

## 2019-06-23 DIAGNOSIS — H35033 Hypertensive retinopathy, bilateral: Secondary | ICD-10-CM

## 2019-06-23 MED ORDER — CYANOCOBALAMIN 1000 MCG/ML IJ SOLN
1000.0000 ug | Freq: Once | INTRAMUSCULAR | Status: AC
  Administered 2019-06-23: 1000 ug via INTRAMUSCULAR

## 2019-06-23 NOTE — Progress Notes (Signed)
Per orders of Dr. Yong Channel, injection of [place], vitamin B12 1000 mcg given in left deltoid by Gertie Exon, CMA.  Patient tolerated injection well.

## 2019-07-01 DIAGNOSIS — M47816 Spondylosis without myelopathy or radiculopathy, lumbar region: Secondary | ICD-10-CM | POA: Diagnosis not present

## 2019-07-01 DIAGNOSIS — M533 Sacrococcygeal disorders, not elsewhere classified: Secondary | ICD-10-CM | POA: Diagnosis not present

## 2019-07-01 DIAGNOSIS — G894 Chronic pain syndrome: Secondary | ICD-10-CM | POA: Diagnosis not present

## 2019-07-02 ENCOUNTER — Telehealth: Payer: Self-pay | Admitting: Family Medicine

## 2019-07-02 DIAGNOSIS — Z5181 Encounter for therapeutic drug level monitoring: Secondary | ICD-10-CM | POA: Diagnosis not present

## 2019-07-02 DIAGNOSIS — Z79899 Other long term (current) drug therapy: Secondary | ICD-10-CM | POA: Diagnosis not present

## 2019-07-02 NOTE — Telephone Encounter (Signed)
Patient wife is calling to ask does the patient need to fast for 07/07/2019 appt. Please advise [phone]

## 2019-07-02 NOTE — Telephone Encounter (Signed)
Spoke to pt told him he does not need to be fasting for upcoming appt with Dr. Yong Channel. Pt verbalized understanding.

## 2019-07-06 ENCOUNTER — Encounter: Payer: Self-pay | Admitting: Family Medicine

## 2019-07-07 ENCOUNTER — Encounter: Payer: Self-pay | Admitting: Family Medicine

## 2019-07-07 ENCOUNTER — Ambulatory Visit (INDEPENDENT_AMBULATORY_CARE_PROVIDER_SITE_OTHER): Payer: Medicare Other | Admitting: Family Medicine

## 2019-07-07 ENCOUNTER — Other Ambulatory Visit: Payer: Self-pay

## 2019-07-07 VITALS — BP 118/76 | HR 56 | Temp 98.0°F | Ht 72.0 in | Wt 268.0 lb

## 2019-07-07 DIAGNOSIS — I152 Hypertension secondary to endocrine disorders: Secondary | ICD-10-CM

## 2019-07-07 DIAGNOSIS — G4700 Insomnia, unspecified: Secondary | ICD-10-CM | POA: Diagnosis not present

## 2019-07-07 DIAGNOSIS — E11319 Type 2 diabetes mellitus with unspecified diabetic retinopathy without macular edema: Secondary | ICD-10-CM | POA: Diagnosis not present

## 2019-07-07 DIAGNOSIS — Z794 Long term (current) use of insulin: Secondary | ICD-10-CM

## 2019-07-07 DIAGNOSIS — E1159 Type 2 diabetes mellitus with other circulatory complications: Secondary | ICD-10-CM | POA: Diagnosis not present

## 2019-07-07 DIAGNOSIS — E034 Atrophy of thyroid (acquired): Secondary | ICD-10-CM | POA: Diagnosis not present

## 2019-07-07 DIAGNOSIS — E1169 Type 2 diabetes mellitus with other specified complication: Secondary | ICD-10-CM

## 2019-07-07 DIAGNOSIS — I10 Essential (primary) hypertension: Secondary | ICD-10-CM | POA: Diagnosis not present

## 2019-07-07 DIAGNOSIS — E785 Hyperlipidemia, unspecified: Secondary | ICD-10-CM

## 2019-07-07 NOTE — Progress Notes (Signed)
Phone [phone]   Subjective:  [name] is a 78 y.o. year old very pleasant male patient who presents for/with See problem oriented charting Chief Complaint  Patient presents with  . Follow-up  . Diabetes  . Hypertension  . B12 deficiency  . Hypothyroidism  . Hyperlipidemia  . Gastroesophageal Reflux   ROS- Denies HA, dizziness, CP,  visual changes   Past Medical History-  Patient Active Problem List   Diagnosis Date Noted  . Chronic bilateral low back pain without sciatica 12/12/2015    Priority: High  . Chronic lymphocytic leukemia ([place]) 05/16/2014    Priority: High  . History of prostate cancer 01/24/2014    Priority: High  . 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 Diabetic retinopathy ([place]) 09/24/2007    Priority: High  . Tremor 08/11/2015    Priority: Medium  . Hypothyroidism     Priority: Medium  . 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 Insomnia 09/21/2009    Priority: Medium  . Hyperlipidemia associated with type 2 diabetes mellitus ([place]) 09/24/2007    Priority: Medium  . OSA on CPAP 09/24/2007    Priority: Medium  . Hypertension associated with diabetes ([place]) 09/24/2007    Priority: Medium  . Personal history of skin cancer 04/02/2017    Priority: Low  . GERD (gastroesophageal reflux disease) 12/08/2014    Priority: Low  . Morbid obesity ([place]) 10/25/2008    Priority: Low  . History of colonic polyps 08/22/2008    Priority: Low  . 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 Ear pressure, left 04/14/2017  . Temporomandibular joint (TMJ) pain 04/14/2017  . Pain syndrome, chronic 02/01/2016  . 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 myelopathy or radiculopathy 02/01/2016    Medications- reviewed and updated Current Outpatient Medications  Medication Sig Dispense Refill  . amLODipine (NORVASC) 10 MG tablet Take 10 mg by mouth every morning.    . fenofibrate 160 MG tablet  Take 160 mg by mouth daily.    . hydrochlorothiazide (HYDRODIURIL) 25 MG tablet Take 25 mg by mouth daily.    Marland Kitchen ibuprofen (ADVIL,MOTRIN) 600 MG tablet Take 600 mg by mouth every 6 (six) hours as needed.    . insulin aspart (NOVOLOG) 100 UNIT/ML injection Inject 45 Units into the skin 3 (three) times daily with meals. When sugar is elevated    . insulin glargine (LANTUS) 100 UNIT/ML injection Inject 102 Units into the skin at bedtime.     . lansoprazole (PREVACID) 30 MG capsule Take 1 capsule (30 mg total) by mouth daily at 12 noon. 30 capsule 5  . levothyroxine (SYNTHROID, LEVOTHROID) 50 MCG tablet Take 50 mcg by mouth daily before breakfast.     . losartan (COZAAR) 100 MG tablet Take 100 mg by mouth daily.    . meclizine (ANTIVERT) 25 MG tablet Take 25 mg by mouth every 6 (six) hours.    . mirtazapine (REMERON) 15 MG tablet Take 15 mg by mouth at bedtime.    . ONE TOUCH ULTRA TEST test strip Test 4 times daily. Dx: E11.9 100 each 11  . OVER THE COUNTER MEDICATION PreserVision for eyes    . potassium chloride SA (K-DUR,KLOR-CON) 20 MEQ tablet Take 20 mEq by mouth daily.    . propranolol (INDERAL) 40 MG tablet Take 1 tablet (40 mg total) by mouth 2 (two) times daily. 180 tablet 3  . simvastatin (ZOCOR) 40 MG tablet  Take 40 mg by mouth daily. [facility]- dose unclear but taking 1/2 tablet    . sucralfate (CARAFATE) 1 G tablet Take 1 g by mouth 4 (four) times daily -  with meals and at bedtime.    . traMADol (ULTRAM) 50 MG tablet Take 50 mg by mouth every 6 (six) hours as needed. for pain  0   No current facility-administered medications for this visit.      Objective:  BP 118/76 (BP Location: Left Arm, Patient Position: Sitting, Cuff Size: Large)   Pulse (!) 56   Temp 98 F (36.7 C) (Oral)   Ht 6' (1.829 m)   Wt 268 lb (121.6 kg)   SpO2 97%   BMI 36.35 kg/m  Gen: NAD, resting comfortably CV: RRR no murmurs rubs or gallops Lungs: CTAB no crackles, wheeze, rhonchi Abdomen:  soft/nontender/nondistended/normal bowel sounds.  Ext: trace edema Skin: warm, dry    Assessment and Plan   # Patient had virtual visit last month but apparently wanted in person follow up  # Diabetes S:  controlled on Novolog 45 units TID and Lantus 102 units qhs.  CBGs- FBG has been running in the 160s. He recently had steroid injection. Reports occasional hypoglycemic episode, last occurring Monday- he forgot to eat and we discussed importance of eating regularly.  Exercise and diet- Not exercising regularly. He is "watching everything" he eats per him- basically not being careful with food choices- counseled on at least chair exercises and healthy eating  Lab Results  Component Value Date   HGBA1C 7.9 06/01/2019   HGBA1C 7.7 (H) 12/28/2018   HGBA1C 7.7 (A) 07/24/2018   A/P: will target a1c under 8 especially with some hypoglycemia- advised to eat 3 meals a day and snacks if needed. Counseled on healthy lifestyle choices.  -he is enjoying freestyle libre- much easier to use with tremor and retinopathy  #hypertension S: controlled on Amlodipine 10 mg daily, HCTZ 25 mg daily, K-Dur 20 MEQ daily, and Propranolol 40 mg BID. Losartan was to replace Irbesartan but pt has not been taking either. Checking BP at home occasionally, staying below 140/90. . Does complain of SOB with exertion- stable for over a year- likely related to sedentary activity BP Readings from Last 3 Encounters:  07/07/19 118/76  06/17/19 136/67  04/01/19 135/75  A/P: controlled but do want him to take losartan due to renal protection with his kidneys- he was unsure if he was taking it but thinks may have been since was on his med list- he will verify with wife.   #hypothyroidism S: On thyroid medication-Levothyroxine 50 mcg daily.   Lab Results  Component Value Date   TSH 1.95 12/28/2018   A/P: hopefully controlled update tsh with next a1c likely  #hyperlipidemia S: mild poorly controlled (in regards to  triglycerides) on Fenofibrate 160 mg daily and Simvastatin 40 mg daily. .  Lab Results  Component Value Date   CHOL 135 06/01/2019   HDL 54 06/01/2019   LDLCALC 10 06/01/2019   LDLDIRECT 49.0 12/28/2018   TRIG 357 (A) 06/01/2019   CHOLHDL 2 07/16/2017   A/P: mild poor control- continue current meds but encouraged weight loss, healthy eating, regular exercise.   # Insomnia- on remeron through [place]- has had depression in past as well but no recent issues. No SI. May need letter for concealed carry updated.   # GERD S:Sx well controlled on Lansoprazole 30 mg daily and Sucralfate 1 g QID.   A/P: Stable. Continue current  medications.    Recommended follow up: 4 month follow up Future Appointments  Date Time Provider [place]  07/29/2019  9:30 AM [facility] NURSE [facility] PEC  08/12/2019  7:30 AM Hayden Pedro, MD [facility] None  08/26/2019 10:00 AM [facility] LAB 5 [facility] None  08/26/2019 10:30 AM Shadad, Mathis Dad, MD [facility] None   Lab/Order associations:   ICD-10-CM   1. Type 2 diabetes mellitus with retinopathy of both eyes, with long-term current use of insulin, macular edema presence unspecified, unspecified retinopathy severity ([place])  E11.319    Z79.4   2. Hyperlipidemia associated with type 2 diabetes mellitus ([place])  E11.69    E78.5   3. Hypertension associated with diabetes ([place])  E11.59    I10   4. Hypothyroidism due to acquired atrophy of thyroid  E03.4   5. Insomnia, unspecified type  G47.00    Return precautions advised.  [name], MD

## 2019-07-07 NOTE — Patient Instructions (Addendum)
Health Maintenance Due  Topic Date Due  . INFLUENZA VACCINE - check back with [place] in 1 month- should have flu shots by then 07/03/2019   No changes today- you should be taking losartan which is on your list.   You should not be taking anything not on your list- let me know if you have been - check with your wife when you get home

## 2019-07-27 ENCOUNTER — Encounter: Payer: Self-pay | Admitting: Family Medicine

## 2019-07-28 DIAGNOSIS — G894 Chronic pain syndrome: Secondary | ICD-10-CM | POA: Diagnosis not present

## 2019-07-29 ENCOUNTER — Ambulatory Visit (INDEPENDENT_AMBULATORY_CARE_PROVIDER_SITE_OTHER): Payer: Medicare Other

## 2019-07-29 ENCOUNTER — Other Ambulatory Visit: Payer: Self-pay

## 2019-07-29 DIAGNOSIS — E538 Deficiency of other specified B group vitamins: Secondary | ICD-10-CM | POA: Diagnosis not present

## 2019-07-29 DIAGNOSIS — Z23 Encounter for immunization: Secondary | ICD-10-CM

## 2019-07-29 MED ORDER — CYANOCOBALAMIN 1000 MCG/ML IJ SOLN
1000.0000 ug | Freq: Once | INTRAMUSCULAR | Status: AC
  Administered 2019-07-29: 1000 ug via INTRAMUSCULAR

## 2019-07-29 NOTE — Progress Notes (Signed)
Cyanocobalamin 1000 mcg/mL, 1 mL given IM, left deltoid, pt tolerated well.  

## 2019-08-12 ENCOUNTER — Other Ambulatory Visit: Payer: Self-pay

## 2019-08-12 ENCOUNTER — Encounter (INDEPENDENT_AMBULATORY_CARE_PROVIDER_SITE_OTHER): Payer: Medicare Other | Admitting: Ophthalmology

## 2019-08-12 DIAGNOSIS — H35372 Puckering of macula, left eye: Secondary | ICD-10-CM | POA: Diagnosis not present

## 2019-08-12 DIAGNOSIS — H43813 Vitreous degeneration, bilateral: Secondary | ICD-10-CM

## 2019-08-12 DIAGNOSIS — I10 Essential (primary) hypertension: Secondary | ICD-10-CM | POA: Diagnosis not present

## 2019-08-12 DIAGNOSIS — H35033 Hypertensive retinopathy, bilateral: Secondary | ICD-10-CM

## 2019-08-12 DIAGNOSIS — H353231 Exudative age-related macular degeneration, bilateral, with active choroidal neovascularization: Secondary | ICD-10-CM

## 2019-08-16 ENCOUNTER — Encounter: Payer: Self-pay | Admitting: Podiatry

## 2019-08-16 ENCOUNTER — Encounter: Payer: Self-pay | Admitting: Family Medicine

## 2019-08-16 ENCOUNTER — Other Ambulatory Visit: Payer: Self-pay | Admitting: Podiatry

## 2019-08-16 ENCOUNTER — Ambulatory Visit (INDEPENDENT_AMBULATORY_CARE_PROVIDER_SITE_OTHER): Payer: Medicare Other | Admitting: Podiatry

## 2019-08-16 ENCOUNTER — Other Ambulatory Visit: Payer: Self-pay

## 2019-08-16 ENCOUNTER — Ambulatory Visit (INDEPENDENT_AMBULATORY_CARE_PROVIDER_SITE_OTHER): Payer: Medicare Other

## 2019-08-16 DIAGNOSIS — R609 Edema, unspecified: Secondary | ICD-10-CM

## 2019-08-16 DIAGNOSIS — R600 Localized edema: Secondary | ICD-10-CM

## 2019-08-17 ENCOUNTER — Telehealth: Payer: Self-pay | Admitting: Family Medicine

## 2019-08-17 NOTE — Telephone Encounter (Signed)
Patient has been scheduled for 08/23/19 at 2:20pm

## 2019-08-17 NOTE — Telephone Encounter (Signed)
Please advise if patient can be worked in. Copied from [place] [phone]. Topic: Appointment Scheduling - Scheduling Inquiry for Clinic >> Aug 17, 2019  7:56 AM Alanda Slim E wrote: Reason for CRM: Pts wife called in to schedule an appt with dr. Yong Channel for this week for swelling of the feet. Pt stated she only wants to see Dr. Yong Channel and that her advised her they maybe a spot he has left for emergencies/ please advise

## 2019-08-18 NOTE — Progress Notes (Signed)
   HPI: 78 y.o. male presenting today with a chief complaint of bilateral feet swelling that began a few weeks ago. He denies any pain or modifying factors. He has not had any treatment for the symptoms. Patient is here for further evaluation and treatment.   Past Medical History:  Diagnosis Date  . Arthritis   . Cataract   . Colon polyps   . DDD (degenerative disc disease), lumbar   . Depression    PTSD  . Diabetes mellitus   . Diabetic retinopathy   . Diverticulosis of colon   . Elevated PSA   . ERECTILE DYSFUNCTION 12/30/2007   No rx.     Marland Kitchen GERD (gastroesophageal reflux disease)   . Hyperlipidemia   . Hypertension   . Hypothyroidism   . IBS (irritable bowel syndrome)   . Leukemia ([place]) 01/2014  . Macular degeneration    followed by ophthalmology  . OSA (obstructive sleep apnea)   . PONV (postoperative nausea and vomiting)   . Prostate cancer ([place])   . PTSD (post-traumatic stress disorder)    [facility]  . Sleep apnea      Physical Exam: General: The patient is alert and oriented x3 in no acute distress.  Dermatology: Skin is warm, dry and supple bilateral lower extremities. Negative for open lesions or macerations.  Vascular: Pitting edema noted to the bilateral feet and ankles. Palpable pedal pulses bilaterally. No erythema noted. Capillary refill within normal limits.  Neurological: Epicritic and protective threshold grossly intact bilaterally.   Musculoskeletal Exam: Range of motion within normal limits to all pedal and ankle joints bilateral. Muscle strength 5/5 in all groups bilateral.   Radiographic Exam:  Normal osseous mineralization. Joint spaces preserved. No fracture/dislocation/boney destruction.    Assessment: 1. Edema bilateral feet   Plan of Care:  1. Patient evaluated. X-Rays reviewed.  2. Compression anklet dispensed bilaterally.  3. Recommended follow up with PCP for diuretic medication management.  4. Return to clinic as needed.       Edrick Kins, DPM [facility]  Dr. Edrick Kins, DPM    [address], [place] [address]                Office [phone]  Fax [phone]

## 2019-08-19 ENCOUNTER — Other Ambulatory Visit: Payer: Medicare Other

## 2019-08-19 ENCOUNTER — Ambulatory Visit: Payer: Medicare Other | Admitting: Oncology

## 2019-08-23 ENCOUNTER — Ambulatory Visit (INDEPENDENT_AMBULATORY_CARE_PROVIDER_SITE_OTHER): Payer: Medicare Other | Admitting: Family Medicine

## 2019-08-23 ENCOUNTER — Encounter: Payer: Self-pay | Admitting: Family Medicine

## 2019-08-23 ENCOUNTER — Other Ambulatory Visit: Payer: Self-pay

## 2019-08-23 VITALS — BP 138/84 | HR 57 | Temp 98.6°F | Wt 286.0 lb

## 2019-08-23 DIAGNOSIS — I10 Essential (primary) hypertension: Secondary | ICD-10-CM

## 2019-08-23 DIAGNOSIS — R0602 Shortness of breath: Secondary | ICD-10-CM

## 2019-08-23 DIAGNOSIS — E1159 Type 2 diabetes mellitus with other circulatory complications: Secondary | ICD-10-CM | POA: Diagnosis not present

## 2019-08-23 DIAGNOSIS — R609 Edema, unspecified: Secondary | ICD-10-CM

## 2019-08-23 DIAGNOSIS — J301 Allergic rhinitis due to pollen: Secondary | ICD-10-CM

## 2019-08-23 DIAGNOSIS — I152 Hypertension secondary to endocrine disorders: Secondary | ICD-10-CM

## 2019-08-23 DIAGNOSIS — E034 Atrophy of thyroid (acquired): Secondary | ICD-10-CM | POA: Diagnosis not present

## 2019-08-23 NOTE — Progress Notes (Signed)
Phone [phone]   Subjective:  [name] is a 78 y.o. year old very pleasant male patient who presents for/with See problem oriented charting Chief Complaint  Patient presents with  . swelling in feet   ROS-still with baseline shortness of breath.  No fever/chills/loss of taste or smell/nausea/vomiting/diarrhea.  Past Medical History-  Patient Active Problem List   Diagnosis Date Noted  . Chronic bilateral low back pain without sciatica 12/12/2015    Priority: High  . Chronic lymphocytic leukemia ([place]) 05/16/2014    Priority: High  . History of prostate cancer 01/24/2014    Priority: High  . 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 Diabetic retinopathy ([place]) 09/24/2007    Priority: High  . Tremor 08/11/2015    Priority: Medium  . Hypothyroidism     Priority: Medium  . 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 Insomnia 09/21/2009    Priority: Medium  . Hyperlipidemia associated with type 2 diabetes mellitus ([place]) 09/24/2007    Priority: Medium  . OSA on CPAP 09/24/2007    Priority: Medium  . Hypertension associated with diabetes ([place]) 09/24/2007    Priority: Medium  . Personal history of skin cancer 04/02/2017    Priority: Low  . GERD (gastroesophageal reflux disease) 12/08/2014    Priority: Low  . Morbid obesity ([place]) 10/25/2008    Priority: Low  . History of colonic polyps 08/22/2008    Priority: Low  . 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 Ear pressure, left 04/14/2017  . Temporomandibular joint (TMJ) pain 04/14/2017  . Pain syndrome, chronic 02/01/2016  . 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 myelopathy or radiculopathy 02/01/2016    Medications- reviewed and updated Current Outpatient Medications  Medication Sig Dispense Refill  . amLODipine (NORVASC) 10 MG tablet Take 10 mg by mouth every morning.    . fenofibrate 160 MG tablet Take 160 mg by mouth daily.    .  hydrochlorothiazide (HYDRODIURIL) 25 MG tablet Take 25 mg by mouth daily.    Marland Kitchen ibuprofen (ADVIL,MOTRIN) 600 MG tablet Take 600 mg by mouth every 6 (six) hours as needed.    . insulin aspart (NOVOLOG) 100 UNIT/ML injection Inject 45 Units into the skin 3 (three) times daily with meals. When sugar is elevated    . insulin glargine (LANTUS) 100 UNIT/ML injection Inject 102 Units into the skin at bedtime.     . lansoprazole (PREVACID) 30 MG capsule Take 1 capsule (30 mg total) by mouth daily at 12 noon. 30 capsule 5  . levothyroxine (SYNTHROID, LEVOTHROID) 50 MCG tablet Take 50 mcg by mouth daily before breakfast.     . losartan (COZAAR) 100 MG tablet Take 100 mg by mouth daily.    . meclizine (ANTIVERT) 25 MG tablet Take 25 mg by mouth every 6 (six) hours.    . mirtazapine (REMERON) 15 MG tablet Take 15 mg by mouth at bedtime.    . ONE TOUCH ULTRA TEST test strip Test 4 times daily. Dx: E11.9 100 each 11  . OVER THE COUNTER MEDICATION PreserVision for eyes    . potassium chloride SA (K-DUR,KLOR-CON) 20 MEQ tablet Take 20 mEq by mouth daily.    . propranolol (INDERAL) 40 MG tablet Take 1 tablet (40 mg total) by mouth 2 (two) times daily. 180 tablet 3  . simvastatin (ZOCOR) 40 MG tablet Take 40 mg by mouth daily. [facility]- dose unclear but taking 1/2 tablet    .  sucralfate (CARAFATE) 1 G tablet Take 1 g by mouth 4 (four) times daily -  with meals and at bedtime.    . traMADol (ULTRAM) 50 MG tablet Take 50 mg by mouth every 6 (six) hours as needed. for pain  0   No current facility-administered medications for this visit.      Objective:  BP 138/84   Pulse (!) 57   Temp 98.6 F (37 C)   Wt 286 lb (129.7 kg)   SpO2 95%   BMI 38.79 kg/m  Gen: NAD, resting comfortably CV: RRR no murmurs rubs or gallops Lungs: CTAB no crackles, wheeze, rhonchi Abdomen: soft/nontender/nondistended/normal bowel sounds.  Ext: 1+ edema left >right, 2+ bounding PT pulses Skin: warm, dry Neuro: Hard of hearing     Assessment and Plan   # Allergic rhinitis S:get most fall seasons- seems to be getting worse each year. Current symptoms started over a month ago- runny nose, occasional cough- clear. No sinus pressure. No fever/chills. No loss of taste or smell. Some sneezing. Watery eyes noted.  Occasional claritin D. Worst symptom is the runny nose- seems to be worse with meals.   Has flonase at home he thinks.  A/P: Strongly suspect allergic rhinitis- patient reports having issues each fall with this.  Symptoms over a month and not worsening-strongly doubt COVID-19-we did not opt to send for testing.  I do think Flonase is rather low risk for him- would prefer he avoid Claritin-D due to hypertension.  Recommended he restart Flonase from the [place] 2 sprays each nostril daily and reach out to [place] if he needs a refill.   Swelling of Feet S:Pt states he has swelling in both of his feet and he is not sure when this started. He denies swelling in his legs and hands. Saw podiatry and they stated swollen but not dangerous. Compression hose do help him-podiatry recommended the use.  Had echocardiogram 10/16/15 with normal EF and normal diastolic function. Normal wall motion. No recent increase weight. No recent shortness of breath.   Takes HCTZ and thought about "doubling this up " A/P: I suspect issues are from venous insufficiency- noted in the past patient has had trace edema and appears has worsened some.  We will check some labs to make sure no obvious lab abnormality contributing to edema.  Will get a BNP and if elevated consider updating echocardiogram with last in November 2016-doing this primarily due to baseline shortness of breath and risk factors for diastolic CHF/heart failure with preserved ejection fraction such as obesity and hypertension - Patient being on amlodipine also likely increases risk of edema   #Hypertension S: Controlled on amlodipine 10 mg, hydrochlorothiazide 25 mg, losartan 100 mg,  propranolol 40 mg twice a day A/P: Well-controlled on repeat today.  Blood pressure goal at least under 140/90   #Diabetes type 2 with ophthalmic complications S: Compliant with Lantus 100 units.  Also uses short acting insulin 45 units before meals.  A1c goal 7.5-8or less-last number on upper end of acceptable at the [facility] Lab Results  Component Value Date   HGBA1C 7.9 06/01/2019   A/P: Patient asks about ordering early A1c today-offered to do this but he declines after discussion there may be a charge   #Hypothyroidism S: Compliant with levothyroxine 50 mcg A/P: Suspect Stable. Continue current medications as long as TSH at goal -Update TSH today  %  Patient with known CLL-continues to follow with Dr. Alen Blew.  Stable. -Expect some abnormalities on CBC  Recommended follow up: Already scheduled for December follow-up Future Appointments  Date Time Provider [place]  08/26/2019 10:00 AM [facility] LAB 5 [facility] None  08/26/2019 10:30 AM Wyatt Portela, MD [facility] None  08/31/2019 10:00 AM [facility] NURSE [facility] PEC  09/30/2019  7:30 AM Hayden Pedro, MD [facility] None  11/05/2019  8:00 AM [name], MD [facility] PEC   Lab/Order associations:   ICD-10-CM   1. Edema, unspecified type  R60.9 Brain natriuretic peptide  2. Hypothyroidism due to acquired atrophy of thyroid  E03.4 TSH  3. Hypertension associated with diabetes ([place])  E11.59 Comprehensive metabolic panel   99991111 CBC with Differential/Platelet  4. Seasonal allergic rhinitis due to pollen  J30.1   5. Shortness of breath  R06.02 Brain natriuretic peptide   Return precautions advised.  [name], MD

## 2019-08-23 NOTE — Patient Instructions (Addendum)
Please verify that you have flonase/fluticasone at home from the [place] and it is in date. If it is- please use 2 sprays each nostril once a day for at least 3 weeks- I think that will help you. If out of date- let me know and I will send it in for you.   We are going to look for any obvious causes of swelling on labs today. May require further workup depending on results  I think the compression stockings are a great idea   Please stop by lab before you go If you do not have mychart- we will call you about results within 5 business days of [place] receiving them.  If you have mychart- we will send your results within 3 business days of [place] receiving them.  If abnormal or we want to clarify a result, we will call or mychart you to make sure you receive the message.  If you have questions or concerns or don't hear within 5-7 days, please send [place] a message or call us.

## 2019-08-24 LAB — CBC WITH DIFFERENTIAL/PLATELET
Basophils Absolute: 0.2 10*3/uL — ABNORMAL HIGH (ref 0.0–0.1)
Basophils Relative: 1.3 % (ref 0.0–3.0)
Eosinophils Absolute: 0.4 10*3/uL (ref 0.0–0.7)
Eosinophils Relative: 2.3 % (ref 0.0–5.0)
HCT: 44.5 % (ref 39.0–52.0)
Hemoglobin: 15 g/dL (ref 13.0–17.0)
Lymphocytes Relative: 55.3 % — ABNORMAL HIGH (ref 12.0–46.0)
Lymphs Abs: 9 10*3/uL — ABNORMAL HIGH (ref 0.7–4.0)
MCHC: 33.6 g/dL (ref 30.0–36.0)
MCV: 83.5 fl (ref 78.0–100.0)
Monocytes Absolute: 1.2 10*3/uL — ABNORMAL HIGH (ref 0.1–1.0)
Monocytes Relative: 7.3 % (ref 3.0–12.0)
Neutro Abs: 5.5 10*3/uL (ref 1.4–7.7)
Neutrophils Relative %: 33.8 % — ABNORMAL LOW (ref 43.0–77.0)
Platelets: 237 10*3/uL (ref 150.0–400.0)
RBC: 5.33 Mil/uL (ref 4.22–5.81)
RDW: 14.8 % (ref 11.5–15.5)
WBC: 16.2 10*3/uL — ABNORMAL HIGH (ref 4.0–10.5)

## 2019-08-24 LAB — COMPREHENSIVE METABOLIC PANEL
ALT: 25 U/L (ref 0–53)
AST: 30 U/L (ref 0–37)
Albumin: 4.4 g/dL (ref 3.5–5.2)
Alkaline Phosphatase: 49 U/L (ref 39–117)
BUN: 12 mg/dL (ref 6–23)
CO2: 29 mEq/L (ref 19–32)
Calcium: 9.7 mg/dL (ref 8.4–10.5)
Chloride: 101 mEq/L (ref 96–112)
Creatinine, Ser: 0.92 mg/dL (ref 0.40–1.50)
GFR: 79.98 mL/min (ref >60.00)
Glucose, Bld: 69 mg/dL — ABNORMAL LOW (ref 70–99)
Potassium: 4 mEq/L (ref 3.5–5.1)
Sodium: 140 mEq/L (ref 135–145)
Total Bilirubin: 1 mg/dL (ref 0.2–1.2)
Total Protein: 6.4 g/dL (ref 6.0–8.3)

## 2019-08-24 LAB — TSH: TSH: 2.61 u[IU]/mL (ref 0.35–4.50)

## 2019-08-24 LAB — BRAIN NATRIURETIC PEPTIDE: Pro B Natriuretic peptide (BNP): 42 pg/mL (ref 0.0–100.0)

## 2019-08-25 ENCOUNTER — Encounter: Payer: Self-pay | Admitting: Family Medicine

## 2019-08-25 DIAGNOSIS — G8929 Other chronic pain: Secondary | ICD-10-CM | POA: Diagnosis not present

## 2019-08-25 DIAGNOSIS — M25551 Pain in right hip: Secondary | ICD-10-CM | POA: Diagnosis not present

## 2019-08-26 ENCOUNTER — Other Ambulatory Visit: Payer: Self-pay

## 2019-08-26 ENCOUNTER — Inpatient Hospital Stay (HOSPITAL_BASED_OUTPATIENT_CLINIC_OR_DEPARTMENT_OTHER): Payer: Medicare Other | Admitting: Oncology

## 2019-08-26 ENCOUNTER — Encounter: Payer: Self-pay | Admitting: Oncology

## 2019-08-26 ENCOUNTER — Inpatient Hospital Stay: Payer: Medicare Other | Attending: Oncology

## 2019-08-26 VITALS — BP 130/74 | HR 90 | Temp 98.5°F | Resp 18 | Ht 72.0 in | Wt 267.9 lb

## 2019-08-26 DIAGNOSIS — C9110 Chronic lymphocytic leukemia of B-cell type not having achieved remission: Secondary | ICD-10-CM | POA: Diagnosis not present

## 2019-08-26 DIAGNOSIS — C61 Malignant neoplasm of prostate: Secondary | ICD-10-CM | POA: Diagnosis not present

## 2019-08-26 DIAGNOSIS — Z794 Long term (current) use of insulin: Secondary | ICD-10-CM | POA: Insufficient documentation

## 2019-08-26 DIAGNOSIS — Z79899 Other long term (current) drug therapy: Secondary | ICD-10-CM | POA: Insufficient documentation

## 2019-08-26 DIAGNOSIS — C9190 Lymphoid leukemia, unspecified not having achieved remission: Secondary | ICD-10-CM | POA: Diagnosis not present

## 2019-08-26 LAB — CBC WITH DIFFERENTIAL (CANCER CENTER ONLY)
Abs Immature Granulocytes: 0.03 10*3/uL (ref 0.00–0.07)
Basophils Absolute: 0.1 10*3/uL (ref 0.0–0.1)
Basophils Relative: 1 %
Eosinophils Absolute: 0.4 10*3/uL (ref 0.0–0.5)
Eosinophils Relative: 3 %
HCT: 45.7 % (ref 39.0–52.0)
Hemoglobin: 15.1 g/dL (ref 13.0–17.0)
Immature Granulocytes: 0 %
Lymphocytes Relative: 50 %
Lymphs Abs: 8.1 10*3/uL — ABNORMAL HIGH (ref 0.7–4.0)
MCH: 27.7 pg (ref 26.0–34.0)
MCHC: 33 g/dL (ref 30.0–36.0)
MCV: 83.9 fL (ref 80.0–100.0)
Monocytes Absolute: 1.7 10*3/uL — ABNORMAL HIGH (ref 0.1–1.0)
Monocytes Relative: 11 %
Neutro Abs: 5.6 10*3/uL (ref 1.7–7.7)
Neutrophils Relative %: 35 %
Platelet Count: 236 10*3/uL (ref 150–400)
RBC: 5.45 MIL/uL (ref 4.22–5.81)
RDW: 14.6 % (ref 11.5–15.5)
WBC Count: 15.9 10*3/uL — ABNORMAL HIGH (ref 4.0–10.5)
nRBC: 0 % (ref 0.0–0.2)

## 2019-08-26 LAB — CMP (CANCER CENTER ONLY)
ALT: 29 U/L (ref 0–44)
AST: 31 U/L (ref 15–41)
Albumin: 4.2 g/dL (ref 3.5–5.0)
Alkaline Phosphatase: 57 U/L (ref 38–126)
Anion gap: 9 (ref 5–15)
BUN: 14 mg/dL (ref 8–23)
CO2: 28 mmol/L (ref 22–32)
Calcium: 9 mg/dL (ref 8.9–10.3)
Chloride: 104 mmol/L (ref 98–111)
Creatinine: 1.08 mg/dL (ref 0.61–1.24)
GFR, Est AFR Am: >60 mL/min (ref >60)
GFR, Estimated: >60 mL/min (ref >60)
Glucose, Bld: 207 mg/dL — ABNORMAL HIGH (ref 70–99)
Potassium: 3.9 mmol/L (ref 3.5–5.1)
Sodium: 141 mmol/L (ref 135–145)
Total Bilirubin: 1.3 mg/dL — ABNORMAL HIGH (ref 0.3–1.2)
Total Protein: 6.5 g/dL (ref 6.5–8.1)

## 2019-08-26 NOTE — Progress Notes (Signed)
Hematology and Oncology Follow Up Visit  [name]:[id number] [birth year] 78 y.o. 08/26/2019 9:26 AM Yong Channel, [name], MDHunter, [name], MD   Principle Diagnosis: 78 year old man with:  1.  Prostate cancer presented with stage T1c, Gleason score 3+4 = 7 PSA of 6.8 in February 2015.  He remains to have disease-free without any evidence of relapse.  2.  Chronic lymphocytic lymphoma (CLL) after presenting with lymphadenopathy postoperatively from his prostate cancer 2015.   Prior Therapy: Status post robotic prostatectomy in February 2015 with the pathology revealed a Gleason score 3+4 = 7 without any evidence of extraprostatic extension.   Current therapy: Active surveillance.  Interim History:  [name] is here for a follow-up visit.  Since the last visit, he reports no major changes in his health.  He continues to be ambulatory without any recent falls or syncope.  He denies any recent hospitalization or illnesses.  He denies any abdominal pain or discomfort.  His last PSA in the [place] continues to be close to 0.  He denies any painful adenopathy or constitutional symptoms.  He is not able to drive because of his vision and relies on his wife for driving.   Patient denied any alteration mental status, neuropathy, confusion or dizziness.  Denies any headaches or lethargy.  Denies any night sweats, weight loss or changes in appetite.  Denied orthopnea, dyspnea on exertion or chest discomfort.  Denies shortness of breath, difficulty breathing hemoptysis or cough.  Denies any abdominal distention, nausea, early satiety or dyspepsia.  Denies any hematuria, frequency, dysuria or nocturia.  Denies any skin irritation, dryness or rash.  Denies any ecchymosis or petechiae.  Denies any lymphadenopathy or clotting.  Denies any heat or cold intolerance.  Denies any anxiety or depression.  Remaining review of system is negative.     Medications: Updated on review. Current Outpatient Medications   Medication Sig Dispense Refill  . amLODipine (NORVASC) 10 MG tablet Take 10 mg by mouth every morning.    . fenofibrate 160 MG tablet Take 160 mg by mouth daily.    . hydrochlorothiazide (HYDRODIURIL) 25 MG tablet Take 25 mg by mouth daily.    Marland Kitchen ibuprofen (ADVIL,MOTRIN) 600 MG tablet Take 600 mg by mouth every 6 (six) hours as needed.    . insulin aspart (NOVOLOG) 100 UNIT/ML injection Inject 45 Units into the skin 3 (three) times daily with meals. When sugar is elevated    . insulin glargine (LANTUS) 100 UNIT/ML injection Inject 102 Units into the skin at bedtime.     . lansoprazole (PREVACID) 30 MG capsule Take 1 capsule (30 mg total) by mouth daily at 12 noon. 30 capsule 5  . levothyroxine (SYNTHROID, LEVOTHROID) 50 MCG tablet Take 50 mcg by mouth daily before breakfast.     . losartan (COZAAR) 100 MG tablet Take 100 mg by mouth daily.    . meclizine (ANTIVERT) 25 MG tablet Take 25 mg by mouth every 6 (six) hours.    . mirtazapine (REMERON) 15 MG tablet Take 15 mg by mouth at bedtime.    . ONE TOUCH ULTRA TEST test strip Test 4 times daily. Dx: E11.9 100 each 11  . OVER THE COUNTER MEDICATION PreserVision for eyes    . potassium chloride SA (K-DUR,KLOR-CON) 20 MEQ tablet Take 20 mEq by mouth daily.    . propranolol (INDERAL) 40 MG tablet Take 1 tablet (40 mg total) by mouth 2 (two) times daily. 180 tablet 3  . simvastatin (ZOCOR) 40  MG tablet Take 40 mg by mouth daily. [facility]- dose unclear but taking 1/2 tablet    . sucralfate (CARAFATE) 1 G tablet Take 1 g by mouth 4 (four) times daily -  with meals and at bedtime.    . traMADol (ULTRAM) 50 MG tablet Take 50 mg by mouth every 6 (six) hours as needed. for pain  0   No current facility-administered medications for this visit.      Allergies:  Allergies  Allergen Reactions  . Morphine Sulfate Itching and Rash    Past Medical History, Surgical history, Social history, and Family History without any changes on  review.    Physical exam: Blood pressure 130/74, pulse 90, temperature 98.5 F (36.9 C), temperature source Oral, resp. rate 18, height 6' (1.829 m), weight 267 lb 14.4 oz (121.5 kg), SpO2 97 %.   ECOG: 1   General appearance: Alert, awake without any distress. Head: Atraumatic without abnormalities Oropharynx: Without any thrush or ulcers. Eyes: No scleral icterus. Lymph nodes: No lymphadenopathy noted in the cervical, supraclavicular, or axillary nodes Heart:regular rate and rhythm, without any murmurs or gallops.   Lung: Clear to auscultation without any rhonchi, wheezes or dullness to percussion. Abdomin: Soft, nontender without any shifting dullness or ascites. Musculoskeletal: No clubbing or cyanosis. Neurological: No motor or sensory deficits. Skin: No rashes or lesions.  .   Lab Results: Lab Results  Component Value Date   WBC 16.2 (H) 08/23/2019   HGB 15.0 08/23/2019   HCT 44.5 08/23/2019   MCV 83.5 08/23/2019   PLT 237.0 08/23/2019     Chemistry      Component Value Date/Time   NA 140 08/23/2019 1508   NA 138 06/01/2019   NA 142 10/30/2017 0845   K 4.0 08/23/2019 1508   K 3.7 10/30/2017 0845   CL 101 08/23/2019 1508   CO2 29 08/23/2019 1508   CO2 28 10/30/2017 0845   BUN 12 08/23/2019 1508   BUN 13.3 10/30/2017 0845   CREATININE 0.92 08/23/2019 1508   CREATININE 1.2 10/30/2017 0845   GLU 240 06/01/2019      Component Value Date/Time   CALCIUM 9.7 08/23/2019 1508   CALCIUM 9.5 10/30/2017 0845   ALKPHOS 49 08/23/2019 1508   ALKPHOS 53 10/30/2017 0845   AST 30 08/23/2019 1508   AST 37 (H) 10/30/2017 0845   ALT 25 08/23/2019 1508   ALT 37 10/30/2017 0845   BILITOT 1.0 08/23/2019 1508   BILITOT 1.23 (H) 10/30/2017 0845         Impression and Plan:  78 year old man:  1.  Prostate cancer diagnosed in 2015.  He was found to have Gleason score of 3+4 = 7 and status post prostatectomy.  The natural course of this disease was reviewed and  treatment options in the future were reiterated.  At this time he does not require any additional treatment and continues to be in remission followed by [name].  His last PSA continues to be close to 0.  2. 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  Laboratory data in September 2020 showed a white cell count of 16,000 and lymphocytosis which is not dramatically different than previous counts dating back to 2015.  The natural course of this disease and treatment options were reviewed.  At this time I see no indication for treatment and I recommended active surveillance.  Indication for treatment were reviewed which includes painful adenopathy, constitutional symptoms, bone marrow involvement with worsening  cytopenias.  He is agreeable with this plan and all his questions were answered today.   3. Follow-up: In 12 months for repeat evaluation.  15  minutes was spent with the patient face-to-face today.  More than 50% of time was spent on updating his disease status, discussing treatment options and answering questions regarding future plan of care.     Zola Button, MD 9/24/20209:26 AM

## 2019-08-27 ENCOUNTER — Telehealth: Payer: Self-pay | Admitting: Oncology

## 2019-08-27 NOTE — Telephone Encounter (Signed)
Called and spoke with patient. Confirmed appt  °

## 2019-08-31 ENCOUNTER — Other Ambulatory Visit: Payer: Self-pay

## 2019-08-31 ENCOUNTER — Ambulatory Visit (INDEPENDENT_AMBULATORY_CARE_PROVIDER_SITE_OTHER): Payer: Medicare Other

## 2019-08-31 DIAGNOSIS — E538 Deficiency of other specified B group vitamins: Secondary | ICD-10-CM

## 2019-08-31 DIAGNOSIS — D692 Other nonthrombocytopenic purpura: Secondary | ICD-10-CM | POA: Diagnosis not present

## 2019-08-31 DIAGNOSIS — L570 Actinic keratosis: Secondary | ICD-10-CM | POA: Diagnosis not present

## 2019-08-31 DIAGNOSIS — D229 Melanocytic nevi, unspecified: Secondary | ICD-10-CM | POA: Diagnosis not present

## 2019-08-31 MED ORDER — CYANOCOBALAMIN 1000 MCG/ML IJ SOLN
1000.0000 ug | Freq: Once | INTRAMUSCULAR | Status: AC
  Administered 2019-08-31: 1000 ug via INTRAMUSCULAR

## 2019-08-31 NOTE — Progress Notes (Signed)
I have reviewed and agree with note, evaluation, plan.   Aziah Brostrom, MD  

## 2019-08-31 NOTE — Progress Notes (Signed)
Per orders of Dr. Yong Channel, injection of Vitamin b12, 1000 mcg given right deltoid IM by Kevan Ny, CMA  Patient tolerated injection well.  He will return in 1 month for his next injection.

## 2019-09-14 ENCOUNTER — Telehealth: Payer: Self-pay | Admitting: Family Medicine

## 2019-09-14 NOTE — Telephone Encounter (Signed)
I left a message with patient's wife asking to call and schedule Medicare AWV with Loma Sousa ([place]) on 09/30/2019.  Im waiting for a call back to either confirm or decline the appointment. If patient calls back, please update appointment notes.  VDM ([place])

## 2019-09-16 DIAGNOSIS — Z79899 Other long term (current) drug therapy: Secondary | ICD-10-CM | POA: Diagnosis not present

## 2019-09-16 DIAGNOSIS — Z5181 Encounter for therapeutic drug level monitoring: Secondary | ICD-10-CM | POA: Diagnosis not present

## 2019-09-16 DIAGNOSIS — G8929 Other chronic pain: Secondary | ICD-10-CM | POA: Diagnosis not present

## 2019-09-16 DIAGNOSIS — M25551 Pain in right hip: Secondary | ICD-10-CM | POA: Diagnosis not present

## 2019-09-27 DIAGNOSIS — G8929 Other chronic pain: Secondary | ICD-10-CM

## 2019-09-27 HISTORY — DX: Other chronic pain: G89.29

## 2019-09-30 ENCOUNTER — Ambulatory Visit: Payer: Medicare Other

## 2019-09-30 ENCOUNTER — Encounter (INDEPENDENT_AMBULATORY_CARE_PROVIDER_SITE_OTHER): Payer: Medicare Other | Admitting: Ophthalmology

## 2019-09-30 ENCOUNTER — Other Ambulatory Visit: Payer: Self-pay

## 2019-09-30 ENCOUNTER — Ambulatory Visit (INDEPENDENT_AMBULATORY_CARE_PROVIDER_SITE_OTHER): Payer: Medicare Other

## 2019-09-30 DIAGNOSIS — H43813 Vitreous degeneration, bilateral: Secondary | ICD-10-CM | POA: Diagnosis not present

## 2019-09-30 DIAGNOSIS — H35033 Hypertensive retinopathy, bilateral: Secondary | ICD-10-CM

## 2019-09-30 DIAGNOSIS — H353231 Exudative age-related macular degeneration, bilateral, with active choroidal neovascularization: Secondary | ICD-10-CM | POA: Diagnosis not present

## 2019-09-30 DIAGNOSIS — I10 Essential (primary) hypertension: Secondary | ICD-10-CM | POA: Diagnosis not present

## 2019-09-30 DIAGNOSIS — E538 Deficiency of other specified B group vitamins: Secondary | ICD-10-CM | POA: Diagnosis not present

## 2019-09-30 MED ORDER — CYANOCOBALAMIN 1000 MCG/ML IJ SOLN
1000.0000 ug | Freq: Once | INTRAMUSCULAR | Status: AC
  Administered 2019-09-30: 1000 ug via INTRAMUSCULAR

## 2019-09-30 NOTE — Progress Notes (Signed)
Per orders of Dr. Yong Channel injection of vitamin B12 given in right deltoid by Gertie Exon, CMA. Patient tolerated injection well.

## 2019-10-12 DIAGNOSIS — Z87891 Personal history of nicotine dependence: Secondary | ICD-10-CM | POA: Diagnosis not present

## 2019-10-12 DIAGNOSIS — F4310 Post-traumatic stress disorder, unspecified: Secondary | ICD-10-CM | POA: Diagnosis not present

## 2019-10-12 DIAGNOSIS — M5432 Sciatica, left side: Secondary | ICD-10-CM | POA: Diagnosis not present

## 2019-10-12 DIAGNOSIS — Z79899 Other long term (current) drug therapy: Secondary | ICD-10-CM | POA: Diagnosis not present

## 2019-10-12 DIAGNOSIS — M47816 Spondylosis without myelopathy or radiculopathy, lumbar region: Secondary | ICD-10-CM | POA: Diagnosis not present

## 2019-10-12 DIAGNOSIS — Z794 Long term (current) use of insulin: Secondary | ICD-10-CM | POA: Diagnosis not present

## 2019-10-12 DIAGNOSIS — I10 Essential (primary) hypertension: Secondary | ICD-10-CM | POA: Diagnosis not present

## 2019-10-12 DIAGNOSIS — M1611 Unilateral primary osteoarthritis, right hip: Secondary | ICD-10-CM | POA: Diagnosis not present

## 2019-10-12 DIAGNOSIS — M5431 Sciatica, right side: Secondary | ICD-10-CM | POA: Diagnosis not present

## 2019-10-12 DIAGNOSIS — G4733 Obstructive sleep apnea (adult) (pediatric): Secondary | ICD-10-CM | POA: Diagnosis not present

## 2019-10-12 DIAGNOSIS — M533 Sacrococcygeal disorders, not elsewhere classified: Secondary | ICD-10-CM | POA: Diagnosis not present

## 2019-10-12 DIAGNOSIS — Z885 Allergy status to narcotic agent status: Secondary | ICD-10-CM | POA: Diagnosis not present

## 2019-10-12 DIAGNOSIS — G894 Chronic pain syndrome: Secondary | ICD-10-CM | POA: Diagnosis not present

## 2019-10-12 DIAGNOSIS — E119 Type 2 diabetes mellitus without complications: Secondary | ICD-10-CM | POA: Diagnosis not present

## 2019-10-12 DIAGNOSIS — Z8546 Personal history of malignant neoplasm of prostate: Secondary | ICD-10-CM | POA: Diagnosis not present

## 2019-10-12 DIAGNOSIS — M25551 Pain in right hip: Secondary | ICD-10-CM | POA: Diagnosis not present

## 2019-10-17 ENCOUNTER — Encounter: Payer: Self-pay | Admitting: Family Medicine

## 2019-10-18 ENCOUNTER — Other Ambulatory Visit: Payer: Self-pay

## 2019-10-18 ENCOUNTER — Telehealth: Payer: Self-pay

## 2019-10-18 MED ORDER — GLUCOSE BLOOD VI STRP: ORAL_STRIP | 6 refills | Status: DC

## 2019-10-18 NOTE — Telephone Encounter (Signed)
See request °

## 2019-10-18 NOTE — Telephone Encounter (Signed)
Noted, will await form

## 2019-10-18 NOTE — Telephone Encounter (Signed)
glucose blood test strip Medication Date: 10/18/2019 Department: [facility] Ordering/Authorizing: [name], MD  Order Providers  This was received by pharmacy today but new ins policy due to request usage of strips Dr Yong Channel needs to fill out an additional form and fax back . She has faxed the form over and the form needs to be faxed back to the # requested on the incoming fax. Ins will not approve until additional paper filled out.  Note incoming fax   [facility]

## 2019-10-21 ENCOUNTER — Telehealth: Payer: Self-pay | Admitting: Family Medicine

## 2019-10-21 ENCOUNTER — Encounter: Payer: Self-pay | Admitting: Family Medicine

## 2019-10-21 DIAGNOSIS — M25551 Pain in right hip: Secondary | ICD-10-CM | POA: Diagnosis not present

## 2019-10-21 DIAGNOSIS — G8929 Other chronic pain: Secondary | ICD-10-CM | POA: Diagnosis not present

## 2019-10-21 DIAGNOSIS — G894 Chronic pain syndrome: Secondary | ICD-10-CM | POA: Diagnosis not present

## 2019-10-21 DIAGNOSIS — M47816 Spondylosis without myelopathy or radiculopathy, lumbar region: Secondary | ICD-10-CM | POA: Diagnosis not present

## 2019-10-21 NOTE — Telephone Encounter (Signed)
[place] regarding glucose blood test strip [id number]  Sig states up to 4 times per day. Per the patient's insurance Medicare Part B the sig needs to state a test for 4 times a day.  It can't be a variable sig. Please resend. Thanks.   Cb- [phone]

## 2019-10-21 NOTE — Telephone Encounter (Signed)
See below

## 2019-10-26 ENCOUNTER — Other Ambulatory Visit: Payer: Self-pay

## 2019-10-26 DIAGNOSIS — Z794 Long term (current) use of insulin: Secondary | ICD-10-CM

## 2019-10-26 DIAGNOSIS — E11319 Type 2 diabetes mellitus with unspecified diabetic retinopathy without macular edema: Secondary | ICD-10-CM

## 2019-10-26 MED ORDER — GLUCOSE BLOOD VI STRP: ORAL_STRIP | 12 refills | Status: AC

## 2019-10-26 NOTE — Telephone Encounter (Signed)
Rx sent in

## 2019-10-26 NOTE — Telephone Encounter (Signed)
Form received and faxed to [phone].

## 2019-11-01 ENCOUNTER — Encounter: Payer: Self-pay | Admitting: Family Medicine

## 2019-11-04 ENCOUNTER — Encounter: Payer: Self-pay | Admitting: Family Medicine

## 2019-11-05 ENCOUNTER — Other Ambulatory Visit: Payer: Self-pay

## 2019-11-05 ENCOUNTER — Ambulatory Visit (INDEPENDENT_AMBULATORY_CARE_PROVIDER_SITE_OTHER): Payer: Medicare Other | Admitting: Family Medicine

## 2019-11-05 ENCOUNTER — Encounter: Payer: Self-pay | Admitting: Family Medicine

## 2019-11-05 VITALS — BP 130/60 | HR 53 | Temp 98.2°F | Ht 72.0 in | Wt 266.4 lb

## 2019-11-05 DIAGNOSIS — E538 Deficiency of other specified B group vitamins: Secondary | ICD-10-CM

## 2019-11-05 DIAGNOSIS — E1169 Type 2 diabetes mellitus with other specified complication: Secondary | ICD-10-CM

## 2019-11-05 DIAGNOSIS — E11319 Type 2 diabetes mellitus with unspecified diabetic retinopathy without macular edema: Secondary | ICD-10-CM | POA: Diagnosis not present

## 2019-11-05 DIAGNOSIS — I10 Essential (primary) hypertension: Secondary | ICD-10-CM | POA: Diagnosis not present

## 2019-11-05 DIAGNOSIS — E034 Atrophy of thyroid (acquired): Secondary | ICD-10-CM | POA: Diagnosis not present

## 2019-11-05 DIAGNOSIS — I152 Hypertension secondary to endocrine disorders: Secondary | ICD-10-CM

## 2019-11-05 DIAGNOSIS — E785 Hyperlipidemia, unspecified: Secondary | ICD-10-CM | POA: Diagnosis not present

## 2019-11-05 DIAGNOSIS — E1159 Type 2 diabetes mellitus with other circulatory complications: Secondary | ICD-10-CM | POA: Diagnosis not present

## 2019-11-05 DIAGNOSIS — Z794 Long term (current) use of insulin: Secondary | ICD-10-CM

## 2019-11-05 LAB — CBC WITH DIFFERENTIAL/PLATELET
Basophils Absolute: 0 10*3/uL (ref 0.0–0.1)
Basophils Relative: 0.1 % (ref 0.0–3.0)
Eosinophils Absolute: 0.4 10*3/uL (ref 0.0–0.7)
Eosinophils Relative: 2.4 % (ref 0.0–5.0)
HCT: 45.1 % (ref 39.0–52.0)
Hemoglobin: 15 g/dL (ref 13.0–17.0)
Lymphocytes Relative: 53.1 % — ABNORMAL HIGH (ref 12.0–46.0)
Lymphs Abs: 9.4 10*3/uL — ABNORMAL HIGH (ref 0.7–4.0)
MCHC: 33.2 g/dL (ref 30.0–36.0)
MCV: 84.2 fl (ref 78.0–100.0)
Monocytes Absolute: 1 10*3/uL (ref 0.1–1.0)
Monocytes Relative: 5.8 % (ref 3.0–12.0)
Neutro Abs: 6.9 10*3/uL (ref 1.4–7.7)
Neutrophils Relative %: 38.6 % — ABNORMAL LOW (ref 43.0–77.0)
Platelets: 235 10*3/uL (ref 150.0–400.0)
RBC: 5.36 Mil/uL (ref 4.22–5.81)
RDW: 14.9 % (ref 11.5–15.5)
WBC: 17.8 10*3/uL — ABNORMAL HIGH (ref 4.0–10.5)

## 2019-11-05 LAB — TRIGLYCERIDES: Triglycerides: 193 mg/dL — ABNORMAL HIGH (ref 0.0–149.0)

## 2019-11-05 LAB — COMPREHENSIVE METABOLIC PANEL
ALT: 23 U/L (ref 0–53)
AST: 22 U/L (ref 0–37)
Albumin: 4.3 g/dL (ref 3.5–5.2)
Alkaline Phosphatase: 51 U/L (ref 39–117)
BUN: 13 mg/dL (ref 6–23)
CO2: 28 mEq/L (ref 19–32)
Calcium: 9 mg/dL (ref 8.4–10.5)
Chloride: 102 mEq/L (ref 96–112)
Creatinine, Ser: 0.86 mg/dL (ref 0.40–1.50)
GFR: 86.41 mL/min (ref >60.00)
Glucose, Bld: 84 mg/dL (ref 70–99)
Potassium: 3.7 mEq/L (ref 3.5–5.1)
Sodium: 141 mEq/L (ref 135–145)
Total Bilirubin: 1.5 mg/dL — ABNORMAL HIGH (ref 0.2–1.2)
Total Protein: 6.2 g/dL (ref 6.0–8.3)

## 2019-11-05 LAB — POCT GLYCOSYLATED HEMOGLOBIN (HGB A1C): Hemoglobin A1C: 6.7 % — AB (ref 4.0–5.6)

## 2019-11-05 LAB — LDL CHOLESTEROL, DIRECT: Direct LDL: 47 mg/dL

## 2019-11-05 LAB — TSH: TSH: 1.6 u[IU]/mL (ref 0.35–4.50)

## 2019-11-05 LAB — VITAMIN B12: Vitamin B-12: >1500 pg/mL — ABNORMAL HIGH (ref 211–911)

## 2019-11-05 MED ORDER — CYANOCOBALAMIN 1000 MCG/ML IJ SOLN
1000.0000 ug | Freq: Once | INTRAMUSCULAR | Status: AC
  Administered 2019-11-05: 1000 ug via INTRAMUSCULAR

## 2019-11-05 NOTE — Patient Instructions (Addendum)
B12 injection before you today  A1c today- 6.7! This is great since you are not having lows- continue current meds   Please stop by lab before you go If you do not have mychart- we will call you about results within 5 business days of [place] receiving them.  If you have mychart- we will send your results within 3 business days of [place] receiving them.  If abnormal or we want to clarify a result, we will call or mychart you to make sure you receive the message.  If you have questions or concerns or don't hear within 5-7 days, please send [place] a message or call us.    Hope you have a Merry Christmas!

## 2019-11-05 NOTE — Progress Notes (Signed)
Phone [phone] In person visit   Subjective:   [name] is a 78 y.o. year old very pleasant male patient who presents for/with See problem oriented charting Chief Complaint  Patient presents with  . Follow-up  . Diabetes    ROS- sinus congestion most of year. No chest pain or shortness of breath. No blurry vision. No fever or chills.  Mild foot swelling  This visit occurred during the SARS-CoV-2 public health emergency.  Safety protocols were in place, including screening questions prior to the visit, additional usage of staff PPE, and extensive cleaning of exam room while observing appropriate contact time as indicated for disinfecting solutions.   Past Medical History-  Patient Active Problem List   Diagnosis Date Noted  . Chronic bilateral low back pain without sciatica 12/12/2015    Priority: High  . Chronic lymphocytic leukemia ([place]) 05/16/2014    Priority: High  . History of prostate cancer 01/24/2014    Priority: High  . 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) [id number]    Priority: High  . Diabetic retinopathy ([place]) 09/24/2007    Priority: High  . Tremor 08/11/2015    Priority: Medium  . Hypothyroidism     Priority: Medium  . 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 Insomnia 09/21/2009    Priority: Medium  . Hyperlipidemia associated with type 2 diabetes mellitus ([place]) 09/24/2007    Priority: Medium  . OSA on CPAP 09/24/2007    Priority: Medium  . Hypertension associated with diabetes ([place]) 09/24/2007    Priority: Medium  . Personal history of skin cancer 04/02/2017    Priority: Low  . GERD (gastroesophageal reflux disease) 12/08/2014    Priority: Low  . Morbid obesity ([place]) 10/25/2008    Priority: Low  . History of colonic polyps 08/22/2008    Priority: Low  . 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 Ear pressure, left 04/14/2017  . Temporomandibular joint (TMJ) pain 04/14/2017  . Pain syndrome,  chronic 02/01/2016  . 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 myelopathy or radiculopathy 02/01/2016    Medications- reviewed and updated Current Outpatient Medications  Medication Sig Dispense Refill  . amLODipine (NORVASC) 10 MG tablet Take 10 mg by mouth every morning.    . fenofibrate 160 MG tablet Take 160 mg by mouth daily.    . hydrochlorothiazide (HYDRODIURIL) 25 MG tablet Take 25 mg by mouth daily.    Marland Kitchen ibuprofen (ADVIL,MOTRIN) 600 MG tablet Take 600 mg by mouth every 6 (six) hours as needed.    . insulin aspart (NOVOLOG) 100 UNIT/ML injection Inject 45 Units into the skin 3 (three) times daily with meals. When sugar is elevated    . insulin glargine (LANTUS) 100 UNIT/ML injection Inject 102 Units into the skin at bedtime.     . lansoprazole (PREVACID) 30 MG capsule Take 1 capsule (30 mg total) by mouth daily at 12 noon. 30 capsule 5  . levothyroxine (SYNTHROID, LEVOTHROID) 50 MCG tablet Take 50 mcg by mouth daily before breakfast.     . losartan (COZAAR) 100 MG tablet Take 100 mg by mouth daily.    . meclizine (ANTIVERT) 25 MG tablet Take 25 mg by mouth every 6 (six) hours.    . mirtazapine (REMERON) 15 MG tablet Take 15 mg by mouth at bedtime.    . ONE TOUCH ULTRA TEST test strip Test 4 times daily. Dx: E11.9 100 each 11  . OVER THE COUNTER MEDICATION PreserVision for eyes    .  potassium chloride SA (K-DUR,KLOR-CON) 20 MEQ tablet Take 20 mEq by mouth daily.    . propranolol (INDERAL) 40 MG tablet Take 1 tablet (40 mg total) by mouth 2 (two) times daily. 180 tablet 3  . simvastatin (ZOCOR) 40 MG tablet Take 40 mg by mouth daily. [facility]- dose unclear but taking 1/2 tablet    . sucralfate (CARAFATE) 1 G tablet Take 1 g by mouth 4 (four) times daily -  with meals and at bedtime.    . traMADol (ULTRAM) 50 MG tablet Take 50 mg by mouth every 6 (six) hours as needed. for pain  0  . glucose blood test strip Test up to 4 times a day Dx E11.9 100 each 6  . glucose blood test strip Use to  test 4 times daily. E11.9 100 each 12   No current facility-administered medications for this visit.      Objective:  BP 130/60   Pulse (!) 53   Temp 98.2 F (36.8 C)   Ht 6' (1.829 m)   Wt 266 lb 6.4 oz (120.8 kg)   SpO2 97%   BMI 36.13 kg/m  Gen: NAD, resting comfortably CV: slightly bradycardic on beta blocker but regular,  no murmurs rubs or gallops Lungs: CTAB no crackles, wheeze, rhonchi Ext: no edema Skin: warm, dry    Assessment and Plan   #Hyperlipidemia S: Controlled on simvastatin 40 mg-half of 80 mg tablet  [facility] also added gemfibrozil with triglycerides 379 Lab Results  Component Value Date   CHOL 135 06/01/2019   HDL 54 06/01/2019   LDLCALC 10 06/01/2019   LDLDIRECT 49.0 12/28/2018   TRIG 357 (A) 06/01/2019   CHOLHDL 2 07/16/2017   A/P: Hopefully improved control-update triglycerides and LDL today   #Hypertension S: Controlled on amlodipine 10 mg, hydrochlorothiazide 25 mg, losartan 100 mg, propranolol 40 mg twice a day A/P: Well-controlled.  Blood pressure goal at least under 140/90.  Continue current medications    #Diabetes type 2 with ophthalmic complications S: Compliant with Lantus 100 units (listed as 102 but he thinks he takes 100).  Also uses short acting insulin 45 units before meals.  A1c goal 8 or less due to prior low sugars.  Targeting A1c at least under 8 due to hypoglycemia issues in the past.   CBGs-uses freestyle libre- 80 this AM. Still getting fair # over 200. No lows under 70 recently thankully  Lab Results  Component Value Date   HGBA1C-had had recent steroid injection 7.9 06/01/2019   HGBA1C 7.7 (H) 12/28/2018   HGBA1C 7.7 (A) 07/24/2018   A/P: Hopefully controlled with A1c under 8.  Higher target to avoid hypoglycemia. -he preferred to do POC a1c today and results - A1c today- 6.7! This is great since you are not having lows- continue current meds   #Hypothyroidism S: Compliant with levothyroxine 50 mcg Lab Results  Component  Value Date   TSH 2.61 08/23/2019  A/P suspect stable-update TSH today. Continue current medications as long as TSH at goal   #B12 deficiency S: history of gastric bypass and issues with B12 absorption since that time. have recommended b12 1000 mcg per day- he prefers injections  Lab Results  Component Value Date   VITAMINB12 379 06/01/2019   A/P: Update B12 with labs today.  Will receive injection today as well.  %  Patient with known CLL-continues to follow with Dr. Alen Blew.  StableAAA  Good  -We will get CBC and CMP today- had slightly high  bilirubin next visit  # no depression or thoughts of self harm- may need another letter for permit for gun  Recommended follow up: Return in about 6 months (around 05/05/2020) for follow up- or sooner if needed.  If A1c is above 8 we will plan on 40-month follow-up. Future Appointments  Date Time Provider [place]  11/08/2019  8:15 AM Hayden Pedro, MD [facility] None  08/25/2020  2:30 PM [facility] 1 [facility] None  08/25/2020  3:00 PM Shadad, Mathis Dad, MD [facility] None   Lab/Order associations:   ICD-10-CM   1. B12 deficiency  E53.8 cyanocobalamin ((VITAMIN B-12)) injection 1,000 mcg  2. Type 2 diabetes mellitus with retinopathy of both eyes, with long-term current use of insulin, macular edema presence unspecified, unspecified retinopathy severity ([place])  E11.319    Z79.4   3. Hypothyroidism due to acquired atrophy of thyroid  E03.4   4. Hyperlipidemia associated with type 2 diabetes mellitus ([place])  E11.69    E78.5   5. Hypertension associated with diabetes ([place])  E11.59    I10     Meds ordered this encounter  Medications  . cyanocobalamin ((VITAMIN B-12)) injection 1,000 mcg    Return precautions advised.  [name], MD

## 2019-11-08 ENCOUNTER — Encounter (INDEPENDENT_AMBULATORY_CARE_PROVIDER_SITE_OTHER): Payer: Medicare Other | Admitting: Ophthalmology

## 2019-11-08 DIAGNOSIS — H43813 Vitreous degeneration, bilateral: Secondary | ICD-10-CM

## 2019-11-08 DIAGNOSIS — H353231 Exudative age-related macular degeneration, bilateral, with active choroidal neovascularization: Secondary | ICD-10-CM | POA: Diagnosis not present

## 2019-11-08 DIAGNOSIS — I10 Essential (primary) hypertension: Secondary | ICD-10-CM | POA: Diagnosis not present

## 2019-11-08 DIAGNOSIS — H35033 Hypertensive retinopathy, bilateral: Secondary | ICD-10-CM

## 2019-11-08 DIAGNOSIS — H35372 Puckering of macula, left eye: Secondary | ICD-10-CM

## 2019-11-16 DIAGNOSIS — Z8546 Personal history of malignant neoplasm of prostate: Secondary | ICD-10-CM | POA: Diagnosis not present

## 2019-11-21 ENCOUNTER — Encounter: Payer: Self-pay | Admitting: Family Medicine

## 2019-12-02 ENCOUNTER — Encounter: Payer: Self-pay | Admitting: Family Medicine

## 2019-12-06 ENCOUNTER — Other Ambulatory Visit: Payer: Self-pay

## 2019-12-06 NOTE — Telephone Encounter (Signed)
Sabrena from pharmacy called in regards to pt's test strips. She said Medicare is being more picky this year in regards to documentation for test strips and letter was sent over what was needed. Need last note, diagnosis code and testing logs. Jearl Klinefelter to please refax letter over I am covering for Dr. Ansel Bong nurse. Bing Quarry verbalized understanding and will refax letter.

## 2019-12-07 ENCOUNTER — Ambulatory Visit (INDEPENDENT_AMBULATORY_CARE_PROVIDER_SITE_OTHER): Payer: Medicare Other

## 2019-12-07 DIAGNOSIS — E538 Deficiency of other specified B group vitamins: Secondary | ICD-10-CM

## 2019-12-07 MED ORDER — CYANOCOBALAMIN 1000 MCG/ML IJ SOLN
1000.0000 ug | Freq: Once | INTRAMUSCULAR | Status: AC
  Administered 2019-12-07: 1000 ug via INTRAMUSCULAR

## 2019-12-07 NOTE — Progress Notes (Signed)
I have reviewed and agree with note, evaluation, plan.   Kaylum Shrum, MD  

## 2019-12-07 NOTE — Progress Notes (Signed)
Per orders of Dr. Hunter, injection of Vitamin b12, 1000 mcg given left deltoid IM by Ottilia Pippenger M Paelyn Smick, CMA Patient tolerated injection well.  He will return in 1 month for his next injection  

## 2019-12-08 ENCOUNTER — Encounter: Payer: Self-pay | Admitting: Family Medicine

## 2019-12-10 ENCOUNTER — Other Ambulatory Visit: Payer: Self-pay

## 2019-12-10 MED ORDER — GLUCOSE BLOOD VI STRP: ORAL_STRIP | 6 refills | Status: DC

## 2019-12-21 ENCOUNTER — Other Ambulatory Visit: Payer: Self-pay | Admitting: Family Medicine

## 2019-12-24 ENCOUNTER — Encounter: Payer: Self-pay | Admitting: Family Medicine

## 2020-01-03 ENCOUNTER — Encounter (INDEPENDENT_AMBULATORY_CARE_PROVIDER_SITE_OTHER): Payer: Medicare Other | Admitting: Ophthalmology

## 2020-01-03 DIAGNOSIS — I10 Essential (primary) hypertension: Secondary | ICD-10-CM | POA: Diagnosis not present

## 2020-01-03 DIAGNOSIS — H353231 Exudative age-related macular degeneration, bilateral, with active choroidal neovascularization: Secondary | ICD-10-CM | POA: Diagnosis not present

## 2020-01-03 DIAGNOSIS — H35372 Puckering of macula, left eye: Secondary | ICD-10-CM

## 2020-01-03 DIAGNOSIS — H43813 Vitreous degeneration, bilateral: Secondary | ICD-10-CM

## 2020-01-03 DIAGNOSIS — H35033 Hypertensive retinopathy, bilateral: Secondary | ICD-10-CM

## 2020-01-03 LAB — HM DIABETES EYE EXAM

## 2020-01-06 ENCOUNTER — Other Ambulatory Visit: Payer: Self-pay

## 2020-01-06 NOTE — Progress Notes (Signed)
This encounter was created in error - please disregard.

## 2020-01-07 ENCOUNTER — Ambulatory Visit: Payer: Medicare Other | Admitting: Family Medicine

## 2020-01-13 ENCOUNTER — Encounter: Payer: Self-pay | Admitting: Family Medicine

## 2020-01-26 LAB — CBC AND DIFFERENTIAL
HCT: 47 (ref 41–53)
Hemoglobin: 15.8 (ref 13.5–17.5)
Platelets: 292 (ref 150–399)
WBC: 17.1

## 2020-01-26 LAB — BASIC METABOLIC PANEL
BUN: 10 (ref 4–21)
CO2: 33 — AB (ref 13–22)
Chloride: 104 (ref 99–108)
Creatinine: 1 (ref 0.6–1.3)
Glucose: 73
Potassium: 4 (ref 3.4–5.3)
Sodium: 140 (ref 137–147)

## 2020-01-26 LAB — LIPID PANEL
Cholesterol: 117 (ref 0–200)
HDL: 50 (ref 35–70)
LDL Cholesterol: 95
Triglycerides: 203 — AB (ref 40–160)

## 2020-01-26 LAB — COMPREHENSIVE METABOLIC PANEL
Albumin: 3.8 (ref 3.5–5.0)
Calcium: 9.3 (ref 8.7–10.7)

## 2020-01-26 LAB — HEMOGLOBIN A1C: Hemoglobin A1C: 7

## 2020-01-26 LAB — HEPATIC FUNCTION PANEL
ALT: 40 (ref 10–40)
AST: 44 — AB (ref 14–40)
Bilirubin, Direct: 0.3 (ref 0.01–0.4)

## 2020-01-26 LAB — CBC: RBC: 5.62 — AB (ref 3.87–5.11)

## 2020-02-03 ENCOUNTER — Other Ambulatory Visit: Payer: Self-pay

## 2020-02-03 ENCOUNTER — Ambulatory Visit (INDEPENDENT_AMBULATORY_CARE_PROVIDER_SITE_OTHER): Payer: Medicare Other | Admitting: *Deleted

## 2020-02-03 ENCOUNTER — Encounter: Payer: Self-pay | Admitting: Family Medicine

## 2020-02-03 ENCOUNTER — Telehealth: Payer: Self-pay

## 2020-02-03 ENCOUNTER — Encounter: Payer: Self-pay | Admitting: *Deleted

## 2020-02-03 DIAGNOSIS — E538 Deficiency of other specified B group vitamins: Secondary | ICD-10-CM

## 2020-02-03 MED ORDER — CYANOCOBALAMIN 1000 MCG/ML IJ SOLN
1000.0000 ug | Freq: Once | INTRAMUSCULAR | Status: AC
  Administered 2020-02-03: 1000 ug via INTRAMUSCULAR

## 2020-02-03 NOTE — Telephone Encounter (Signed)
Called and informed patient of information below. Patient verbalized understanding and had no further questions or concerns.

## 2020-02-03 NOTE — Progress Notes (Signed)
I have reviewed and agree with note, evaluation, plan.   Tarvares Lant, MD  

## 2020-02-03 NOTE — Telephone Encounter (Signed)
-----   Message from Wyatt Portela, MD sent at 02/03/2020  9:27 AM EST ----- Regarding: Labs Please let the patient know that I have received his labs from the [place] dated February 24.  His labs are not different from his previous labs that I have for the last year.  I do not see any worrisome signs at this time.  Follow-up in September as scheduled is perfectly safe and does not need to repeat labs sooner than that.  Thanks

## 2020-02-03 NOTE — Progress Notes (Signed)
Per orders of Dr. Yong Channel, injection of cyanocobalamin 1000 mggiven by Anselmo Pickler in left deltoid. Patient tolerated injection well. Patient will make appointment for 1 month

## 2020-02-21 ENCOUNTER — Other Ambulatory Visit: Payer: Self-pay

## 2020-02-21 ENCOUNTER — Encounter (INDEPENDENT_AMBULATORY_CARE_PROVIDER_SITE_OTHER): Payer: Medicare Other | Admitting: Ophthalmology

## 2020-02-21 DIAGNOSIS — H43813 Vitreous degeneration, bilateral: Secondary | ICD-10-CM

## 2020-02-21 DIAGNOSIS — H353231 Exudative age-related macular degeneration, bilateral, with active choroidal neovascularization: Secondary | ICD-10-CM | POA: Diagnosis not present

## 2020-02-21 DIAGNOSIS — H35033 Hypertensive retinopathy, bilateral: Secondary | ICD-10-CM

## 2020-02-21 DIAGNOSIS — H35372 Puckering of macula, left eye: Secondary | ICD-10-CM

## 2020-02-21 DIAGNOSIS — I10 Essential (primary) hypertension: Secondary | ICD-10-CM | POA: Diagnosis not present

## 2020-03-02 ENCOUNTER — Other Ambulatory Visit: Payer: Self-pay

## 2020-03-02 ENCOUNTER — Ambulatory Visit (INDEPENDENT_AMBULATORY_CARE_PROVIDER_SITE_OTHER): Payer: Medicare Other

## 2020-03-02 DIAGNOSIS — E538 Deficiency of other specified B group vitamins: Secondary | ICD-10-CM

## 2020-03-02 MED ORDER — CYANOCOBALAMIN 1000 MCG/ML IJ SOLN
1000.0000 ug | Freq: Once | INTRAMUSCULAR | Status: AC
  Administered 2020-03-02: 1000 ug via INTRAMUSCULAR

## 2020-03-02 NOTE — Progress Notes (Signed)
[name] 79 y.o. male presents to office today for monthly B12 injection per [name], MD. Administered CYANOCOBALAMIN 1,000 mcg IM left arm. Patient tolerated well. States that his wife will call back to schedule next months injection.

## 2020-03-08 NOTE — Progress Notes (Signed)
I have reviewed the patient's encounter and agree with the documentation.  [name]. Jerline Pain, MD 03/08/2020 8:00 AM

## 2020-04-04 ENCOUNTER — Ambulatory Visit (INDEPENDENT_AMBULATORY_CARE_PROVIDER_SITE_OTHER): Payer: Medicare Other

## 2020-04-04 ENCOUNTER — Other Ambulatory Visit: Payer: Self-pay

## 2020-04-04 DIAGNOSIS — E538 Deficiency of other specified B group vitamins: Secondary | ICD-10-CM

## 2020-04-04 MED ORDER — CYANOCOBALAMIN 1000 MCG/ML IJ SOLN
1000.0000 ug | Freq: Once | INTRAMUSCULAR | Status: AC
  Administered 2020-04-04: 1000 ug via INTRAMUSCULAR

## 2020-04-04 NOTE — Progress Notes (Signed)
Per orders of Dr. Yong Channel, injection of B-12 given by Francella Solian in right deltoid. Patient tolerated injection well. Patient will make appointment for 1 month.

## 2020-04-12 ENCOUNTER — Encounter: Payer: Self-pay | Admitting: Family Medicine

## 2020-04-17 ENCOUNTER — Other Ambulatory Visit: Payer: Self-pay

## 2020-04-17 ENCOUNTER — Encounter (INDEPENDENT_AMBULATORY_CARE_PROVIDER_SITE_OTHER): Payer: Medicare Other | Admitting: Ophthalmology

## 2020-04-17 DIAGNOSIS — H353231 Exudative age-related macular degeneration, bilateral, with active choroidal neovascularization: Secondary | ICD-10-CM | POA: Diagnosis not present

## 2020-04-17 DIAGNOSIS — H43813 Vitreous degeneration, bilateral: Secondary | ICD-10-CM | POA: Diagnosis not present

## 2020-04-17 DIAGNOSIS — H35033 Hypertensive retinopathy, bilateral: Secondary | ICD-10-CM | POA: Diagnosis not present

## 2020-04-17 DIAGNOSIS — I10 Essential (primary) hypertension: Secondary | ICD-10-CM | POA: Diagnosis not present

## 2020-04-25 NOTE — Progress Notes (Deleted)
Phone [phone] In person visit   Subjective:   [name] is a 79 y.o. year old very pleasant male patient who presents for/with See problem oriented charting No chief complaint on file.   This visit occurred during the SARS-CoV-2 public health emergency.  Safety protocols were in place, including screening questions prior to the visit, additional usage of staff PPE, and extensive cleaning of exam room while observing appropriate contact time as indicated for disinfecting solutions.   Past Medical History-  Patient Active Problem List   Diagnosis Date Noted   Sacroiliac joint dysfunction 06/29/2018   Ear pressure, left 04/14/2017   Temporomandibular joint (TMJ) pain 04/14/2017   Personal history of skin cancer 04/02/2017   Pain syndrome, chronic 02/01/2016   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 myelopathy or radiculopathy 02/01/2016   Chronic bilateral low back pain without sciatica 12/12/2015   Tremor 08/11/2015   GERD (gastroesophageal reflux disease) 12/08/2014   Hypothyroidism    Chronic lymphocytic leukemia ([place]) 05/16/2014   History of prostate cancer 01/24/2014   Lapband APL + HH repair 08/12/2013   Insomnia 09/21/2009   Morbid obesity ([place]) 10/25/2008   History of colonic polyps 08/22/2008   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     fenofibrate 160 MG tablet Take 160 mg by mouth daily.     glucose blood test strip Use to test 4 times daily. E11.9 100 each 12   glucose blood test strip Test up to 4 times a day Dx E11.9 100 each 6    hydrochlorothiazide (HYDRODIURIL) 25 MG tablet Take 25 mg by mouth daily.     ibuprofen (ADVIL,MOTRIN) 600 MG tablet Take 600 mg by mouth every 6 (six) hours as needed.     insulin aspart (NOVOLOG) 100 UNIT/ML injection Inject 45 Units into the skin 3 (three) times daily with meals. When sugar is elevated     insulin glargine (LANTUS) 100 UNIT/ML injection Inject 102 Units into the skin at bedtime.      lansoprazole (PREVACID) 30 MG capsule Take 1 capsule (30 mg total) by mouth daily at 12 noon. 30 capsule 5   levothyroxine (SYNTHROID, LEVOTHROID) 50 MCG tablet Take 50 mcg by mouth daily before breakfast.      losartan (COZAAR) 100 MG tablet Take 100 mg by mouth daily.     meclizine (ANTIVERT) 25 MG tablet Take 25 mg by mouth every 6 (six) hours.     mirtazapine (REMERON) 15 MG tablet Take 15 mg by mouth at bedtime.     ONETOUCH ULTRA test strip USE FOUR TIMES DAILY 100 each 0   OVER THE COUNTER MEDICATION PreserVision for eyes     potassium chloride SA (K-DUR,KLOR-CON) 20 MEQ tablet Take 20 mEq by mouth daily.     propranolol (INDERAL) 40 MG tablet Take 1 tablet (40 mg total) by mouth 2 (two) times daily. 180 tablet 3   simvastatin (ZOCOR) 40 MG tablet Take 40 mg by mouth daily. Through [facility]- dose unclear but taking 1/2 tablet     sucralfate (CARAFATE) 1 G tablet Take 1 g by mouth 4 (four) times daily -  with  meals and at bedtime.     traMADol (ULTRAM) 50 MG tablet Take 50 mg by mouth every 6 (six) hours as needed. for pain  0   No current facility-administered medications for this visit.     Objective:  There were no vitals taken for this visit. Gen: NAD, resting comfortably CV: RRR no murmurs rubs or gallops Lungs: CTAB no crackles, wheeze, rhonchi Abdomen: soft/nontender/nondistended/normal bowel sounds. No rebound or guarding.  Ext: no edema Skin: warm, dry Neuro: grossly normal, moves all extremities  ***    Assessment and Plan  *** ***awv 12/18/17. No CPE ***[place]  03/08/20 with [facility]- fatty liver and hepatomegaly  #Hyperlipidemia S: Controlled on simvastatin 40 mg-half of 80 mg tablet  [facility] also added gemfibrozil with triglycerides 379 A/P: ***   #Hypertension S: Controlled on amlodipine 10 mg, hydrochlorothiazide 25 mg, losartan 100 mg, propranolol 40 mg twice a day A/P:  ***.  Blood pressure goal at least under 140/90   #Diabetes type 2 with ophthalmic complications S: Compliant with Lantus 100 units.  Also uses short acting insulin 45 units before meals.  A1c goal 7.5 or less.  A/P: ***   #Hypothyroidism S: Compliant with levothyroxine 50 mcg A/P: Suspect Stable. Continue current medications as long as TSH at goal ***   #B12 deficiency S: history of gastric bypass ***. have recommended b12 1000 mcg per day- he prefers injections  A/P: ***  %  Patient with known CLL-continues to follow with Dr. Alen Blew.  Stable.  No problem-specific Assessment & Plan notes found for this encounter.   Recommended follow up: ***No follow-ups on file. Future Appointments  Date Time Provider [place]  05/04/2020  8:00 AM [name], MD [facility]  06/12/2020  7:30 AM Hayden Pedro, MD [facility] None  08/25/2020  2:30 PM [facility] 1 [facility] None  08/25/2020  3:00 PM Wyatt Portela, MD [facility] None  09/11/2020 10:15 AM [name], MD [facility]    Lab/Order associations: No diagnosis found.  No orders of the defined types were placed in this encounter.   Time Spent: *** minutes of total time (4:36 PM***- 4:36 PM***) was spent on the date of the encounter performing the following actions: chart review prior to seeing the patient, obtaining history, performing a medically necessary exam, counseling on the treatment plan, placing orders, and documenting in our EHR.   Return precautions advised.  Clyde Lundborg, CMA

## 2020-05-01 ENCOUNTER — Encounter: Payer: Self-pay | Admitting: Family Medicine

## 2020-05-02 ENCOUNTER — Encounter: Payer: Self-pay | Admitting: Family Medicine

## 2020-05-02 ENCOUNTER — Other Ambulatory Visit: Payer: Self-pay

## 2020-05-02 ENCOUNTER — Ambulatory Visit (INDEPENDENT_AMBULATORY_CARE_PROVIDER_SITE_OTHER): Payer: Medicare Other | Admitting: Family Medicine

## 2020-05-02 VITALS — BP 132/78 | HR 53 | Temp 98.2°F | Ht 72.0 in | Wt 271.0 lb

## 2020-05-02 DIAGNOSIS — I10 Essential (primary) hypertension: Secondary | ICD-10-CM | POA: Diagnosis not present

## 2020-05-02 DIAGNOSIS — C9110 Chronic lymphocytic leukemia of B-cell type not having achieved remission: Secondary | ICD-10-CM

## 2020-05-02 DIAGNOSIS — Z794 Long term (current) use of insulin: Secondary | ICD-10-CM

## 2020-05-02 DIAGNOSIS — D692 Other nonthrombocytopenic purpura: Secondary | ICD-10-CM | POA: Diagnosis not present

## 2020-05-02 DIAGNOSIS — R600 Localized edema: Secondary | ICD-10-CM

## 2020-05-02 DIAGNOSIS — E11319 Type 2 diabetes mellitus with unspecified diabetic retinopathy without macular edema: Secondary | ICD-10-CM

## 2020-05-02 DIAGNOSIS — E538 Deficiency of other specified B group vitamins: Secondary | ICD-10-CM | POA: Diagnosis not present

## 2020-05-02 DIAGNOSIS — E1159 Type 2 diabetes mellitus with other circulatory complications: Secondary | ICD-10-CM | POA: Diagnosis not present

## 2020-05-02 DIAGNOSIS — I152 Hypertension secondary to endocrine disorders: Secondary | ICD-10-CM

## 2020-05-02 HISTORY — DX: Other nonthrombocytopenic purpura: D69.2

## 2020-05-02 LAB — POC URINALSYSI DIPSTICK (AUTOMATED)
Bilirubin, UA: NEGATIVE
Blood, UA: NEGATIVE
Glucose, UA: NEGATIVE
Ketones, UA: NEGATIVE
Leukocytes, UA: NEGATIVE
Nitrite, UA: NEGATIVE
Protein, UA: NEGATIVE
Spec Grav, UA: >=1.03 — AB (ref 1.010–1.025)
Urobilinogen, UA: 0.2 E.U./dL
pH, UA: 6 (ref 5.0–8.0)

## 2020-05-02 LAB — COMPREHENSIVE METABOLIC PANEL
ALT: 27 U/L (ref 0–53)
AST: 30 U/L (ref 0–37)
Albumin: 4.6 g/dL (ref 3.5–5.2)
Alkaline Phosphatase: 58 U/L (ref 39–117)
BUN: 12 mg/dL (ref 6–23)
CO2: 30 mEq/L (ref 19–32)
Calcium: 9.7 mg/dL (ref 8.4–10.5)
Chloride: 100 mEq/L (ref 96–112)
Creatinine, Ser: 0.85 mg/dL (ref 0.40–1.50)
GFR: 87.47 mL/min (ref >60.00)
Glucose, Bld: 127 mg/dL — ABNORMAL HIGH (ref 70–99)
Potassium: 4.2 mEq/L (ref 3.5–5.1)
Sodium: 139 mEq/L (ref 135–145)
Total Bilirubin: 1.4 mg/dL — ABNORMAL HIGH (ref 0.2–1.2)
Total Protein: 6.5 g/dL (ref 6.0–8.3)

## 2020-05-02 LAB — CBC WITH DIFFERENTIAL/PLATELET
Basophils Absolute: 0.1 10*3/uL (ref 0.0–0.1)
Basophils Relative: 0.3 % (ref 0.0–3.0)
Eosinophils Absolute: 0.5 10*3/uL (ref 0.0–0.7)
Eosinophils Relative: 2.5 % (ref 0.0–5.0)
HCT: 44.6 % (ref 39.0–52.0)
Hemoglobin: 14.9 g/dL (ref 13.0–17.0)
Lymphocytes Relative: 59.7 % — ABNORMAL HIGH (ref 12.0–46.0)
Lymphs Abs: 10.7 10*3/uL — ABNORMAL HIGH (ref 0.7–4.0)
MCHC: 33.5 g/dL (ref 30.0–36.0)
MCV: 84.6 fl (ref 78.0–100.0)
Monocytes Absolute: 1.1 10*3/uL — ABNORMAL HIGH (ref 0.1–1.0)
Monocytes Relative: 6 % (ref 3.0–12.0)
Neutro Abs: 5.6 10*3/uL (ref 1.4–7.7)
Neutrophils Relative %: 31.5 % — ABNORMAL LOW (ref 43.0–77.0)
Platelets: 214 10*3/uL (ref 150.0–400.0)
RBC: 5.27 Mil/uL (ref 4.22–5.81)
RDW: 14.7 % (ref 11.5–15.5)
WBC: 17.9 10*3/uL — ABNORMAL HIGH (ref 4.0–10.5)

## 2020-05-02 LAB — BRAIN NATRIURETIC PEPTIDE: Pro B Natriuretic peptide (BNP): 46 pg/mL (ref 0.0–100.0)

## 2020-05-02 LAB — TSH: TSH: 2.19 u[IU]/mL (ref 0.35–4.50)

## 2020-05-02 MED ORDER — VALACYCLOVIR HCL 1 G PO TABS: 1000.0000 mg | ORAL_TABLET | Freq: Three times a day (TID) | ORAL | 0 refills | Status: DC

## 2020-05-02 MED ORDER — CYANOCOBALAMIN 1000 MCG/ML IJ SOLN
1000.0000 ug | Freq: Once | INTRAMUSCULAR | Status: AC
  Administered 2020-05-02: 1000 ug via INTRAMUSCULAR

## 2020-05-02 MED ORDER — PREDNISONE 20 MG PO TABS: 20.0000 mg | ORAL_TABLET | Freq: Every day | ORAL | 0 refills | Status: DC

## 2020-05-02 NOTE — Assessment & Plan Note (Addendum)
Reports easy bruising/bleeding. Continue to monitor

## 2020-05-02 NOTE — Patient Instructions (Addendum)
Take prednisone as soon as you get it today-but Valtrex you should get 2 doses in today  Your prednisone you will take all 3 doses together in the morning typically.  The Valtrex you will spread out and take one pill 3 times a day  Reduce amlodipine to 5 mg as long as blood pressure cooperates (just cut in half for now). Unfortunately prednisone can raiseblood pressure and sugar levels so keep me in the loop. We are not changing this on your med list yet.   Please stop by lab before you go If you have mychart- we will send your results within 3 business days of [place] receiving them.  If you do not have mychart- we will call you about results within 5 business days of [place] receiving them.   b12 injection injection before you go  Recommended follow up: Return in about 1 week (around 05/09/2020). Can use same day slot  Bell Palsy, Adult  Bell palsy is a short-term inability to move muscles in part of the face. The inability to move (paralysis) results from inflammation or compression of the facial nerve, which travels along the skull and under the ear to the side of the face (7th cranial nerve). This nerve is responsible for facial movements that include blinking, closing the eyes, smiling, and frowning. What are the causes? The exact cause of this condition is not known. It may be caused by an infection from a virus, such as the chickenpox (herpes zoster), Epstein-Barr, or mumps virus. What increases the risk? You are more likely to develop this condition if:  You are pregnant.  You have diabetes.  You have had a recent infection in your nose, throat, or airways (upper respiratory infection).  You have a weakened body defense system (immune system).  You have had a facial injury, such as a fracture.  You have a family history of Bell palsy. What are the signs or symptoms? Symptoms of this condition include:  Weakness on one side of the face.  Drooping eyelid and corner of the  mouth.  Excessive tearing in one eye.  Difficulty closing the eyelid.  Dry eye.  Drooling.  Dry mouth.  Changes in taste.  Change in facial appearance.  Pain behind one ear.  Ringing in one or both ears.  Sensitivity to sound in one ear.  Facial twitching.  Headache.  Impaired speech.  Dizziness.  Difficulty eating or drinking. Most of the time, only one side of the face is affected. Rarely, Bell palsy affects the whole face. How is this diagnosed? This condition is diagnosed based on:  Your symptoms.  Your medical history.  A physical exam. You may also have to see health care providers who specialize in disorders of the nerves (neurologist) or diseases and conditions of the eye (ophthalmologist). You may have tests, such as:  A test to check for nerve damage (electromyogram).  Imaging studies, such as CT or MRI scans.  Blood tests. How is this treated? This condition affects every person differently. Sometimes symptoms go away without treatment within a couple weeks. If treatment is needed, it varies from person to person. The goal of treatment is to reduce inflammation and protect the eye from damage. Treatment for Bell palsy may include:  Medicines, such as: ? Steroids to reduce swelling and inflammation. ? Antiviral drugs. ? Pain relievers, including aspirin, acetaminophen, or ibuprofen.  Eye drops or ointment to keep your eye moist.  Eye protection, if you cannot close your eye.  Exercises or massage to regain muscle strength and function (physical therapy). Follow these instructions at home:   Take over-the-counter and prescription medicines only as told by your health care provider.  If your eye is affected: ? Keep your eye moist with eye drops or ointment as told by your health care provider. ? Follow instructions for eye care and protection as told by your health care provider.  Do any physical therapy exercises as told by your health  care provider.  Keep all follow-up visits as told by your health care provider. This is important. Contact a health care provider if:  You have a fever.  Your symptoms do not get better within 2-3 weeks, or your symptoms get worse.  Your eye is red, irritated, or painful.  You have new symptoms. Get help right away if:  You have weakness or numbness in a part of your body other than your face.  You have trouble swallowing.  You develop neck pain or stiffness.  You develop dizziness or shortness of breath. Summary  Bell palsy is a short-term inability to move muscles in part of the face. The inability to move (paralysis) results from inflammation or compression of the facial nerve.  This condition affects every person differently. Sometimes symptoms go away without treatment within a couple weeks.  If treatment is needed, it varies from person to person. The goal of treatment is to reduce inflammation and protect the eye from damage.  Contact your health care provider if your symptoms do not get better within 2-3 weeks, or your symptoms get worse. This information is not intended to replace advice given to you by your health care provider. Make sure you discuss any questions you have with your health care provider. Document Revised: 10/31/2017 Document Reviewed: 01/21/2017 Elsevier Patient Education  2020 [employer].

## 2020-05-02 NOTE — Progress Notes (Addendum)
Phone [phone] In person visit   Subjective:   [name] is a 79 y.o. year old very pleasant male patient who presents for/with See problem oriented charting Chief Complaint  Patient presents with  . Weakness    right side face x 5 days   . Edema    x 6 mo b/l     This visit occurred during the SARS-CoV-2 public health emergency.  Safety protocols were in place, including screening questions prior to the visit, additional usage of staff PPE, and extensive cleaning of exam room while observing appropriate contact time as indicated for disinfecting solutions.   Past Medical History-  Patient Active Problem List   Diagnosis Date Noted  . Chronic bilateral low back pain without sciatica 12/12/2015    Priority: High  . Chronic lymphocytic leukemia ([place]) 05/16/2014    Priority: High  . History of prostate cancer 01/24/2014    Priority: High  . 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 Diabetic retinopathy ([place]) 09/24/2007    Priority: High  . Tremor 08/11/2015    Priority: Medium  . Hypothyroidism     Priority: Medium  . 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 Insomnia 09/21/2009    Priority: Medium  . Hyperlipidemia associated with type 2 diabetes mellitus ([place]) 09/24/2007    Priority: Medium  . OSA on CPAP 09/24/2007    Priority: Medium  . Hypertension associated with diabetes ([place]) 09/24/2007    Priority: Medium  . Personal history of skin cancer 04/02/2017    Priority: Low  . GERD (gastroesophageal reflux disease) 12/08/2014    Priority: Low  . Morbid obesity ([place]) 10/25/2008    Priority: Low  . History of colonic polyps 08/22/2008    Priority: Low  . 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 Ear pressure, left 04/14/2017  . Temporomandibular joint (TMJ) pain 04/14/2017  . Pain syndrome, chronic 02/01/2016  . 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  myelopathy or radiculopathy 02/01/2016    Medications- reviewed and updated Current Outpatient Medications  Medication Sig Dispense Refill  . amLODipine (NORVASC) 10 MG tablet Take 10 mg by mouth every morning.    . fenofibrate 160 MG tablet Take 160 mg by mouth daily.    Marland Kitchen glucose blood test strip Use to test 4 times daily. E11.9 100 each 12  . glucose blood test strip Test up to 4 times a day Dx E11.9 100 each 6  . hydrochlorothiazide (HYDRODIURIL) 25 MG tablet Take 25 mg by mouth daily.    Marland Kitchen ibuprofen (ADVIL,MOTRIN) 600 MG tablet Take 600 mg by mouth every 6 (six) hours as needed.    . insulin aspart (NOVOLOG) 100 UNIT/ML injection Inject 45 Units into the skin 3 (three) times daily with meals. When sugar is elevated    . insulin glargine (LANTUS) 100 UNIT/ML injection Inject 102 Units into the skin at bedtime.     . lansoprazole (PREVACID) 30 MG capsule Take 1 capsule (30 mg total) by mouth daily at 12 noon. 30 capsule 5  . levothyroxine (SYNTHROID, LEVOTHROID) 50 MCG tablet Take 50 mcg by mouth daily before breakfast.     . losartan (COZAAR) 100 MG tablet Take 100 mg by mouth daily.    Glory Rosebush ULTRA test strip USE FOUR TIMES DAILY 100 each 0  . OVER THE COUNTER MEDICATION PreserVision for eyes    . potassium  chloride SA (K-DUR,KLOR-CON) 20 MEQ tablet Take 20 mEq by mouth daily.    . propranolol (INDERAL) 40 MG tablet Take 1 tablet (40 mg total) by mouth 2 (two) times daily. 180 tablet 3  . simvastatin (ZOCOR) 40 MG tablet Take 40 mg by mouth daily. [facility]- dose unclear but taking 1/2 tablet    . sucralfate (CARAFATE) 1 G tablet Take 1 g by mouth 4 (four) times daily -  with meals and at bedtime.    . traMADol (ULTRAM) 50 MG tablet Take 50 mg by mouth every 6 (six) hours as needed. for pain  0  . meclizine (ANTIVERT) 25 MG tablet Take 25 mg by mouth every 6 (six) hours.    . mirtazapine (REMERON) 15 MG tablet Take 15 mg by mouth at bedtime.    . predniSONE (DELTASONE) 20 MG  tablet Take 1 tablet (20 mg total) by mouth daily with breakfast. 21 tablet 0  . valACYclovir (VALTREX) 1000 MG tablet Take 1 tablet (1,000 mg total) by mouth 3 (three) times daily. For 7 days for severe bells palsy 21 tablet 0   No current facility-administered medications for this visit.     Objective:  BP 132/78   Pulse (!) 53   Temp 98.2 F (36.8 C) (Temporal)   Ht 6' (1.829 m)   Wt 271 lb (122.9 kg)   SpO2 96%   BMI 36.75 kg/m  Gen: NAD, resting comfortably CV: RRR no murmurs rubs or gallops Lungs: CTAB no crackles, wheeze, rhonchi Abdomen: soft/nontender/nondistended/normal bowel sounds. No rebound or guarding.  Ext: 2+ edema Skin: warm, dry  Neuro: CN II-XII intact except for significant right-sided weakness including the forehead-patient is not able to fully close his right eye-separated by several millimeters- weak effort on smiling but some movement noted. Left side of face normal, sensation and reflexes normal throughout, 5/5 muscle strength in bilateral upper and lower extremities. Normal finger to nose other than baseline tremor noted. Normal rapid alternating movements. No pronator drift. Normal romberg. Normal gait.    Assessment and Plan   # facial weakness on right S: started on Friday and has increased. Increased drooling.  Trouble closing her eye completely.  Forehead is certainly involved   ROS- No extremity weakness. No slurred words or trouble swallowing (has trouble with eating due to lip weakness). no blurry vision or double vision- mild right eye redness. No paresthesias. No confusion or word finding difficulties.  nies any headaches, weakness in extremity.  A/P: Bell's palsy on the right noted. -At least House-Brackman grade 5-as a result we will treat with both prednisone 60 mg for 7 days and Valtrex 1 g 3 times daily for 7 days -follow up 1 week- we are concerned about his progress, eyes, blood pressure, blood sugars -Recommended artificial tears during  the day and artificial tear ointment at night with taping eye shut in the evening-they are going to work with her pharmacist on appropriate supplies -Recommended eye protection during today with glasses at all times -Mild erythema of right eye-may need ophthalmology involvement-they may call for support potentially  # Edema  S: Long-term issue with edema and bilateral legs and feet. Has compression stockings but does not wear them causes discomfort.  Seems to be worse recently.  Patient is on amlodipine 10 mg  Largely normal echocardiogram 10/12/15. Tsh, cbc, bmp, LFTs (ast slightly off) largely normal within 6 months. [facility] in last few weeks he believes  [place] [facility]- fatty  liver and hepatomegaly A/P: We will update blood work and urine today to evaluate for any obvious cause of the edema-I will also check a BNP the patient reports recently having an echocardiogram.  I do think amlodipine could be contributing and we will reduce to 5 mg   #Hypertension S: Controlled on amlodipine 10 mg, hydrochlorothiazide 25 mg, losartan 100 mg, propranolol 40 mg twice a day A/P: At  Blood pressure goal at least under 140/90.  We will reduce amlodipine to 5 mg and follow-up in 1 week to see if this helps with edema but hopeful blood pressure remains controlled.  With steroids blood pressure increase   #Diabetes type 2 with ophthalmic complications S: Compliant with Lantus 100 units.  Also uses short acting insulin 45 units before meals.  A1c goal 7.5 or less. Lab Results  Component Value Date   HGBA1C 7.0 01/26/2020   HGBA1C 6.7 (A) 11/05/2019   HGBA1C 7.9 06/01/2019  A/P: Good control last check-home numbers look pretty reasonable-occasionally up to the 200s range-we discussed with steroids this may raise significantly   #Hypothyroidism S: Compliant with levothyroxine 50 mcg Lab Results  Component Value Date   TSH 1.60 11/05/2019  A/P: Hopefully stable-update TSH prickly with  edema  #B12 deficiency S: history of gastric bypass . have recommended b12 1000 mcg per day- he prefers injections  Lab Results  Component Value Date   VITAMINB12 >1500 (H) 11/05/2019    A/P: Hopefully controlled- injection provided today  %  Patient with known CLL-continues to follow with Dr. Alen Blew.  Stable and we are rechecking CBC  Recommended follow up: 1 week Future Appointments  Date Time Provider [place]  05/09/2020  1:00 PM [name], MD [facility]  06/12/2020  7:30 AM Hayden Pedro, MD [facility] None  08/25/2020  2:30 PM [facility] 1 [facility] None  08/25/2020  3:00 PM Wyatt Portela, MD [facility] None  09/11/2020 10:15 AM [name], MD [facility]    Lab/Order associations:   ICD-10-CM   1. Bilateral leg edema  R60.0 CBC with Differential/Platelet    Comprehensive metabolic panel    TSH    POCT Urinalysis Dipstick (Automated)    B Nat Peptide  2. Senile purpura (HCC)  D69.2     Meds ordered this encounter  Medications  . predniSONE (DELTASONE) 20 MG tablet    Sig: Take 1 tablet (20 mg total) by mouth daily with breakfast.    Dispense:  21 tablet    Refill:  0  . valACYclovir (VALTREX) 1000 MG tablet    Sig: Take 1 tablet (1,000 mg total) by mouth 3 (three) times daily. For 7 days for severe bells palsy    Dispense:  21 tablet    Refill:  0   Return precautions advised.  [name], MD

## 2020-05-02 NOTE — Addendum Note (Signed)
Addended by: Christiana Fuchs on: 05/02/2020 01:41 PM   Modules accepted: Orders

## 2020-05-02 NOTE — Addendum Note (Signed)
Addended by: Francella Solian on: 05/02/2020 01:53 PM   Modules accepted: Orders

## 2020-05-03 NOTE — Progress Notes (Deleted)
Phone [phone] In person visit   Subjective:   [name] is a 79 y.o. year old very pleasant male patient who presents for/with See problem oriented charting No chief complaint on file.   This visit occurred during the SARS-CoV-2 public health emergency.  Safety protocols were in place, including screening questions prior to the visit, additional usage of staff PPE, and extensive cleaning of exam room while observing appropriate contact time as indicated for disinfecting solutions.   Past Medical History-  Patient Active Problem List   Diagnosis Date Noted  . Senile purpura ([place]) 05/02/2020  . 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 Ear pressure, left 04/14/2017  . Temporomandibular joint (TMJ) pain 04/14/2017  . Personal history of skin cancer 04/02/2017  . Pain syndrome, chronic 02/01/2016  . 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 myelopathy or radiculopathy 02/01/2016  . Chronic bilateral low back pain without sciatica 12/12/2015  . Tremor 08/11/2015  . GERD (gastroesophageal reflux disease) 12/08/2014  . Hypothyroidism   . Chronic lymphocytic leukemia ([place]) 05/16/2014  . History of prostate cancer 01/24/2014  . Lapband APL + HH repair 08/12/2013  . Insomnia 09/21/2009  . Morbid obesity ([place]) 10/25/2008  . History of colonic polyps 08/22/2008  . 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 Diabetic retinopathy ([place]) 09/24/2007  . Hyperlipidemia associated with type 2 diabetes mellitus ([place]) 09/24/2007  . OSA on CPAP 09/24/2007  . Hypertension associated with diabetes ([place]) 09/24/2007    Medications- reviewed and updated Current Outpatient Medications  Medication Sig Dispense Refill  . amLODipine (NORVASC) 10 MG tablet Take 10 mg by mouth every morning.    . fenofibrate 160 MG tablet Take 160 mg by mouth daily.    Marland Kitchen glucose blood test strip Use to test 4 times daily. E11.9 100 each 12  . glucose blood test strip Test up to 4  times a day Dx E11.9 100 each 6  . hydrochlorothiazide (HYDRODIURIL) 25 MG tablet Take 25 mg by mouth daily.    Marland Kitchen ibuprofen (ADVIL,MOTRIN) 600 MG tablet Take 600 mg by mouth every 6 (six) hours as needed.    . insulin aspart (NOVOLOG) 100 UNIT/ML injection Inject 45 Units into the skin 3 (three) times daily with meals. When sugar is elevated    . insulin glargine (LANTUS) 100 UNIT/ML injection Inject 102 Units into the skin at bedtime.     . lansoprazole (PREVACID) 30 MG capsule Take 1 capsule (30 mg total) by mouth daily at 12 noon. 30 capsule 5  . levothyroxine (SYNTHROID, LEVOTHROID) 50 MCG tablet Take 50 mcg by mouth daily before breakfast.     . losartan (COZAAR) 100 MG tablet Take 100 mg by mouth daily.    . meclizine (ANTIVERT) 25 MG tablet Take 25 mg by mouth every 6 (six) hours.    . mirtazapine (REMERON) 15 MG tablet Take 15 mg by mouth at bedtime.    Glory Rosebush ULTRA test strip USE FOUR TIMES DAILY 100 each 0  . OVER THE COUNTER MEDICATION PreserVision for eyes    . potassium chloride SA (K-DUR,KLOR-CON) 20 MEQ tablet Take 20 mEq by mouth daily.    . predniSONE (DELTASONE) 20 MG tablet Take 1 tablet (20 mg total) by mouth daily with breakfast. 21 tablet 0  . propranolol (INDERAL) 40 MG tablet Take 1 tablet (40 mg total) by mouth 2 (two) times daily. 180 tablet 3  . simvastatin (ZOCOR) 40 MG tablet Take 40 mg by mouth daily. Through [place]-  dose unclear but taking 1/2 tablet    . sucralfate (CARAFATE) 1 G tablet Take 1 g by mouth 4 (four) times daily -  with meals and at bedtime.    . traMADol (ULTRAM) 50 MG tablet Take 50 mg by mouth every 6 (six) hours as needed. for pain  0  . valACYclovir (VALTREX) 1000 MG tablet Take 1 tablet (1,000 mg total) by mouth 3 (three) times daily. For 7 days for severe bells palsy 21 tablet 0   No current facility-administered medications for this visit.     Objective:  There were no vitals taken for this visit. Gen: NAD, resting comfortably CV: RRR no  murmurs rubs or gallops Lungs: CTAB no crackles, wheeze, rhonchi Abdomen: soft/nontender/nondistended/normal bowel sounds. No rebound or guarding.  Ext: no edema Skin: warm, dry Neuro: grossly normal, moves all extremities  ***    Assessment and Plan  Bilateral Leg Edema S:***  A/P: ***     ***awv 12/18/17. No CPE ***[place] [facility]- fatty liver and hepatomegaly  #Hyperlipidemia S: Controlled on simvastatin 40 mg-half of 80 mg tablet  [facility] also added gemfibrozil with triglycerides 379 A/P: ***   #Hypertension S: Controlled on amlodipine 10 mg, hydrochlorothiazide 25 mg, losartan 100 mg, propranolol 40 mg twice a day A/P:  ***.  Blood pressure goal at least under 140/90   #Diabetes type 2 with ophthalmic complications S: Compliant with Lantus 100 units.  Also uses short acting insulin 45 units before meals.  A1c goal 7.5 or less.  A/P: ***   #Hypothyroidism S: Compliant with levothyroxine 50 mcg A/P: Suspect Stable. Continue current medications as long as TSH at goal ***   #B12 deficiency S: history of gastric bypass ***. have recommended b12 1000 mcg per day- he prefers injections  A/P: ***  %  Patient with known CLL-continues to follow with Dr. Alen Blew.  Stable.  No problem-specific Assessment & Plan notes found for this encounter.   Recommended follow up: ***No follow-ups on file. Future Appointments  Date Time Provider [place]  05/09/2020  1:00 PM [name], MD [facility]  06/12/2020  7:30 AM Hayden Pedro, MD [facility] None  08/25/2020  2:30 PM [facility] 1 [facility] None  08/25/2020  3:00 PM Wyatt Portela, MD [facility] None  09/11/2020 10:15 AM [name], MD [facility]    Lab/Order associations: No diagnosis found.  No orders of the defined types were placed in this encounter.   Time Spent: *** minutes of total time (12:13 PM***- 12:13 PM***) was spent on the date of the encounter performing the following actions:  chart review prior to seeing the patient, obtaining history, performing a medically necessary exam, counseling on the treatment plan, placing orders, and documenting in our EHR.   Return precautions advised.  Clyde Lundborg, CMA

## 2020-05-04 ENCOUNTER — Ambulatory Visit: Payer: Medicare Other | Admitting: Family Medicine

## 2020-05-05 ENCOUNTER — Encounter: Payer: Self-pay | Admitting: Family Medicine

## 2020-05-08 ENCOUNTER — Encounter: Payer: Self-pay | Admitting: Family Medicine

## 2020-05-08 ENCOUNTER — Ambulatory Visit (INDEPENDENT_AMBULATORY_CARE_PROVIDER_SITE_OTHER): Payer: Medicare Other | Admitting: Family Medicine

## 2020-05-08 ENCOUNTER — Other Ambulatory Visit: Payer: Self-pay

## 2020-05-08 VITALS — BP 124/62 | HR 52 | Temp 98.1°F | Ht 72.0 in | Wt 271.6 lb

## 2020-05-08 DIAGNOSIS — G510 Bell's palsy: Secondary | ICD-10-CM | POA: Diagnosis not present

## 2020-05-08 DIAGNOSIS — I10 Essential (primary) hypertension: Secondary | ICD-10-CM

## 2020-05-08 DIAGNOSIS — E1159 Type 2 diabetes mellitus with other circulatory complications: Secondary | ICD-10-CM | POA: Diagnosis not present

## 2020-05-08 DIAGNOSIS — R600 Localized edema: Secondary | ICD-10-CM | POA: Diagnosis not present

## 2020-05-08 DIAGNOSIS — E11319 Type 2 diabetes mellitus with unspecified diabetic retinopathy without macular edema: Secondary | ICD-10-CM

## 2020-05-08 DIAGNOSIS — Z794 Long term (current) use of insulin: Secondary | ICD-10-CM | POA: Diagnosis not present

## 2020-05-08 DIAGNOSIS — I152 Hypertension secondary to endocrine disorders: Secondary | ICD-10-CM

## 2020-05-08 HISTORY — DX: Bell's palsy: G51.0

## 2020-05-08 NOTE — Patient Instructions (Addendum)
Scheduled a follow up visit at the end of June or early July.  Finish the course of prednisone and valtrex, let us know if new or worsening symptoms.

## 2020-05-08 NOTE — Progress Notes (Signed)
Phone [phone] In person visit   Subjective:   [name] is a 79 y.o. year old very pleasant male patient who presents for/with See problem oriented charting Chief Complaint  Patient presents with  . Follow-up  . Bells Palsy   This visit occurred during the SARS-CoV-2 public health emergency.  Safety protocols were in place, including screening questions prior to the visit, additional usage of staff PPE, and extensive cleaning of exam room while observing appropriate contact time as indicated for disinfecting solutions.   Past Medical History-  Patient Active Problem List   Diagnosis Date Noted  . Chronic bilateral low back pain without sciatica 12/12/2015    Priority: High  . Chronic lymphocytic leukemia ([place]) 05/16/2014    Priority: High  . History of prostate cancer 01/24/2014    Priority: High  . 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 Diabetic retinopathy ([place]) 09/24/2007    Priority: High  . Tremor 08/11/2015    Priority: Medium  . Hypothyroidism     Priority: Medium  . 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 Insomnia 09/21/2009    Priority: Medium  . Hyperlipidemia associated with type 2 diabetes mellitus ([place]) 09/24/2007    Priority: Medium  . OSA on CPAP 09/24/2007    Priority: Medium  . Hypertension associated with diabetes ([place]) 09/24/2007    Priority: Medium  . Personal history of skin cancer 04/02/2017    Priority: Low  . GERD (gastroesophageal reflux disease) 12/08/2014    Priority: Low  . Morbid obesity ([place]) 10/25/2008    Priority: Low  . History of colonic polyps 08/22/2008    Priority: Low  . Bell's palsy 05/08/2020  . Senile purpura ([place]) 05/02/2020  . 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 Ear pressure, left 04/14/2017  . Temporomandibular joint (TMJ) pain 04/14/2017  . Pain syndrome, chronic 02/01/2016  . 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 myelopathy or  radiculopathy 02/01/2016    Medications- reviewed and updated Current Outpatient Medications  Medication Sig Dispense Refill  . amLODipine (NORVASC) 10 MG tablet Take 10 mg by mouth every morning.    . fenofibrate 160 MG tablet Take 160 mg by mouth daily.    Marland Kitchen glucose blood test strip Use to test 4 times daily. E11.9 100 each 12  . hydrochlorothiazide (HYDRODIURIL) 25 MG tablet Take 25 mg by mouth daily.    Marland Kitchen ibuprofen (ADVIL,MOTRIN) 600 MG tablet Take 600 mg by mouth every 6 (six) hours as needed.    . insulin aspart (NOVOLOG) 100 UNIT/ML injection Inject 45 Units into the skin 3 (three) times daily with meals. When sugar is elevated    . insulin glargine (LANTUS) 100 UNIT/ML injection Inject 102 Units into the skin at bedtime.     . lansoprazole (PREVACID) 30 MG capsule Take 1 capsule (30 mg total) by mouth daily at 12 noon. 30 capsule 5  . levothyroxine (SYNTHROID, LEVOTHROID) 50 MCG tablet Take 50 mcg by mouth daily before breakfast.     . losartan (COZAAR) 100 MG tablet Take 100 mg by mouth daily.    . meclizine (ANTIVERT) 25 MG tablet Take 25 mg by mouth every 6 (six) hours.    . mirtazapine (REMERON) 15 MG tablet Take 15 mg by mouth at bedtime.    Glory Rosebush ULTRA test strip USE FOUR TIMES DAILY 100 each 0  . OVER THE COUNTER MEDICATION PreserVision for eyes    .  potassium chloride SA (K-DUR,KLOR-CON) 20 MEQ tablet Take 20 mEq by mouth daily.    . predniSONE (DELTASONE) 20 MG tablet Take 1 tablet (20 mg total) by mouth daily with breakfast. 21 tablet 0  . propranolol (INDERAL) 40 MG tablet Take 1 tablet (40 mg total) by mouth 2 (two) times daily. 180 tablet 3  . simvastatin (ZOCOR) 40 MG tablet Take 40 mg by mouth daily. [facility] dose [facility] but taking 1/2 tablet    . sucralfate (CARAFATE) 1 G tablet Take 1 g by mouth 4 (four) times daily -  with meals and at bedtime.    . traMADol (ULTRAM) 50 MG tablet Take 50 mg by mouth every 6 (six) hours as needed. for pain  0  .  valACYclovir (VALTREX) 1000 MG tablet Take 1 tablet (1,000 mg total) by mouth 3 (three) times daily. For 7 days for severe bells palsy 21 tablet 0   No current facility-administered medications for this visit.     Objective:  BP 124/62   Pulse (!) 52   Temp 98.1 F (36.7 C)   Ht 6' (1.829 m)   Wt 271 lb 9.6 oz (123.2 kg)   SpO2 97%   BMI 36.84 kg/m  Gen: NAD, resting comfortably CV: RRR no murmurs rubs or gallops Lungs: CTAB no crackles, wheeze, rhonchi Ext: no edema Skin: warm, dry Neuro: now able to fully close right eye with maximal exertion. Visible asymmetry in face at rest but not disfiguring- weakness on right side. More motion with eyebrow and smile and cheek than previous.     Assessment and Plan   #Bell's palsy S: Patient's symptoms started the last Friday of April.  He was having trouble closing his eye completely.  Forehead was certainly involved.  Complained of drooling.  Significant facial weakness on the right.  House-Brackman grade 4-we started him on prednisone 60 mg for 7 days and Valtrex 1 g 3 times a day with plan for 1 week follow-up to monitor blood sugar and blood pressure -Recommended artificial tears and taping eye shut at night -Recommended eye protection at all times glasses in the daytime -Had mild erythema of the right eye-had considered Ortho follow-up  Patient has already noted significant improvement in his symptoms-now able to fully close his right eye.  He opted not to tape his eye shut at night but likely his eyes actually seem to be less red today-has been aggressive about artificial tears.  He is noticing more motion in his right mouth-also drooling less from his mouth  A/P: Bell's palsy appears to be improving with house Brackmann scale down from 5 to 3 today.  He will complete course of Valtrex and prednisone.  See discussion about swelling, blood pressure, blood sugar below.  Recommend follow-up in 2 weeks-he would like to stretch this to late  June or early July-he will schedule at checkout  #Bilateral leg edema S: Long-term issues with edema in bilateral hands and feet.  In the past reported discomfort  (doesn't like the heat) with compression stockings but does not wear them.  He is on amlodipine 10 mg which could contribute.  Echocardiogram November 2016 largely reassuring and reported recent echocardiogram at the [place].  Wife hs had him using his compression stockings since last visit though. We also discussed activity in daytime such as going to [facility] for exercise bike or poor could help.  He opted to stop his amlodipine completely A/P: Edema seems improved.  Blood work without obvious cause  of edema at last visit. -I am okay with him remaining off amlodipine as long as blood pressure remains controlled -Continue compression stockings -[facility] the [facility] and in addition BNP not elevated so I do not think we need to pursue further cardiac work-up at this time.  #hypertension S: medication: Hydrochlorothiazide 25 mg, losartan 100 mg, propranolol 40 mg twice daily.  He opted to stop his amlodipine Home readings #s: Typically less than 140/90 BP Readings from Last 3 Encounters:  05/08/20 124/62  05/02/20 132/78  11/05/19 130/60  A/P: Excellent control despite stopping amlodipine-continue current medication    #Diabetes type 2 with ophthalmic complications- lifestyle meter S: Compliant with Lantus 100 units.  Also uses short acting insulin 45 units before meals.  A1c goal 7.5 or less.  With prednisone he did take higher doses of insulin. #s range from 11-274 (once)- honestly better than I was expecting .   Lab Results  Component Value Date   HGBA1C 7.0 01/26/2020   HGBA1C 6.7 (A) 11/05/2019   HGBA1C 7.9 06/01/2019  A/P: Blood sugars trending up-patient has tried to account for this by increasing insulin slightly-he was not able to give me specifics today but fortunately he has not  had any lows-we will continue with current regimen and recheck A1c at next visit-suspect may have some elevation particularly with prednisone use during this timeframe  Recommended follow up: Return in about 3 weeks (around 05/29/2020). Future Appointments  Date Time Provider Rafael Gonzalez  05/31/2020 10:00 AM [name], MD [facility]  06/12/2020  7:30 AM Hayden Pedro, MD [facility] None  08/25/2020  2:30 PM [facility] LAB 1 [facility] None  08/25/2020  3:00 PM Wyatt Portela, MD [facility] None  09/11/2020 10:15 AM [name], MD [facility]   Lab/Order associations:   ICD-10-CM   1. Bell's palsy  G51.0   2. Bilateral leg edema  R60.0   3. Type 2 diabetes mellitus with retinopathy of both eyes, with long-term current use of insulin, macular edema presence unspecified, unspecified retinopathy severity ([place])  E11.319    Z79.4   4. Hypertension associated with diabetes ([place])  E11.59    I10    Return precautions advised.  [name], MD

## 2020-05-09 ENCOUNTER — Ambulatory Visit: Payer: Medicare Other | Admitting: Family Medicine

## 2020-05-31 ENCOUNTER — Ambulatory Visit: Payer: Medicare Other | Admitting: Family Medicine

## 2020-06-06 NOTE — Progress Notes (Signed)
Phone [phone] In person visit   Subjective:   [name] is a 79 y.o. year old very pleasant male patient who presents for/with See problem oriented charting Chief Complaint  Patient presents with  . Follow-up   This visit occurred during the SARS-CoV-2 public health emergency.  Safety protocols were in place, including screening questions prior to the visit, additional usage of staff PPE, and extensive cleaning of exam room while observing appropriate contact time as indicated for disinfecting solutions.   Past Medical History-  Patient Active Problem List   Diagnosis Date Noted  . Chronic bilateral low back pain without sciatica 12/12/2015    Priority: High  . Chronic lymphocytic leukemia ([place]) 05/16/2014    Priority: High  . History of prostate cancer 01/24/2014    Priority: High  . 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 Diabetic retinopathy ([place]) 09/24/2007    Priority: High  . Tremor 08/11/2015    Priority: Medium  . Hypothyroidism     Priority: Medium  . 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 Insomnia 09/21/2009    Priority: Medium  . Hyperlipidemia associated with type 2 diabetes mellitus ([place]) 09/24/2007    Priority: Medium  . OSA on CPAP 09/24/2007    Priority: Medium  . Hypertension associated with diabetes ([place]) 09/24/2007    Priority: Medium  . Personal history of skin cancer 04/02/2017    Priority: Low  . GERD (gastroesophageal reflux disease) 12/08/2014    Priority: Low  . Morbid obesity ([place]) 10/25/2008    Priority: Low  . History of colonic polyps 08/22/2008    Priority: Low  . Bell's palsy 05/08/2020  . Senile purpura ([place]) 05/02/2020  . 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 Ear pressure, left 04/14/2017  . Temporomandibular joint (TMJ) pain 04/14/2017  . Pain syndrome, chronic 02/01/2016  . 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 myelopathy or radiculopathy  02/01/2016    Medications- reviewed and updated Current Outpatient Medications  Medication Sig Dispense Refill  . amLODipine (NORVASC) 10 MG tablet Take 10 mg by mouth every morning.    . fenofibrate 160 MG tablet Take 160 mg by mouth daily.    Marland Kitchen glucose blood test strip Use to test 4 times daily. E11.9 100 each 12  . hydrochlorothiazide (HYDRODIURIL) 25 MG tablet Take 25 mg by mouth daily.    Marland Kitchen ibuprofen (ADVIL,MOTRIN) 600 MG tablet Take 600 mg by mouth every 6 (six) hours as needed.    . insulin aspart (NOVOLOG) 100 UNIT/ML injection Inject 45 Units into the skin 3 (three) times daily with meals. When sugar is elevated    . insulin glargine (LANTUS) 100 UNIT/ML injection Inject 102 Units into the skin at bedtime.     . lansoprazole (PREVACID) 30 MG capsule Take 1 capsule (30 mg total) by mouth daily at 12 noon. 30 capsule 5  . levothyroxine (SYNTHROID, LEVOTHROID) 50 MCG tablet Take 50 mcg by mouth daily before breakfast.     . losartan (COZAAR) 100 MG tablet Take 100 mg by mouth daily.    . meclizine (ANTIVERT) 25 MG tablet Take 25 mg by mouth every 6 (six) hours.    . mirtazapine (REMERON) 15 MG tablet Take 15 mg by mouth at bedtime.    Glory Rosebush ULTRA test strip USE FOUR TIMES DAILY 100 each 0  . OVER THE COUNTER MEDICATION PreserVision for eyes    . potassium chloride  SA (K-DUR,KLOR-CON) 20 MEQ tablet Take 20 mEq by mouth daily.    . propranolol (INDERAL) 40 MG tablet Take 1 tablet (40 mg total) by mouth 2 (two) times daily. 180 tablet 3  . simvastatin (ZOCOR) 40 MG tablet Take 40 mg by mouth daily. [facility]- dose unclear but taking 1/2 tablet    . sucralfate (CARAFATE) 1 G tablet Take 1 g by mouth 4 (four) times daily -  with meals and at bedtime.    . traMADol (ULTRAM) 50 MG tablet Take 50 mg by mouth every 6 (six) hours as needed. for pain  0   No current facility-administered medications for this visit.     Objective:  BP 136/68   Pulse 63   Temp 99.1 F (37.3 C)   Ht  6' (1.829 m)   Wt 274 lb 6.4 oz (124.5 kg)   SpO2 95%   BMI 37.22 kg/m  Gen: NAD, resting comfortably Ext: 1+ edema despite compression stockings Skin: warm, dry Neuro: Slight droop corner of right mouth-not as apparent with smile and found.  Seems to have good elbow movement.  Able to close eyes completely now      Assessment and Plan  F/U Bell's Palsy S: much improved. Only mild droop on right lip- family members didn't even notice unless wife pointed out  A/P: much improved- no futher intervention needed    #Hypertension S: Controlled on hydrochlorothiazide 25 mg, losartan 100 mg, propranolol 40 mg twice a day -  off amlodipine 5 mg (was on 10mg  05/02/20 but he wanted to stop due to edema) Home #s:  Typically less than 140/90 on average- though does have some #s into 140s or 150s (states was after being active though) all diastolic's are controlled A/P:  Reasonable control today-we will monitor at home once a day and if averaging over 140/90 they will let me know.  For now continue current medications - compression stockings helping edema- also has some devices coming from the [place]   #Diabetes type 2 with ophthalmic complications- lifestyle meter S: Compliant with Lantus 100 units.  Also uses short acting insulin 45 units before meals- occasionally more if higher.  A1c goal 7.5 or less.  #s higher on steroids but have trended back down significantly. Did have one # at 27 but forot to eat- advised to always eat Lab Results  Component Value Date   HGBA1C 7.0 01/26/2020   HGBA1C 6.7 (A) 11/05/2019   HGBA1C 7.9 06/01/2019   A/P: hopefully controlled- update a1c today   #Hypothyroidism S: Compliant with levothyroxine 50 mcg Lab Results  Component Value Date   TSH 2.19 05/02/2020  A/P: Stable-continue current medications   #B12 deficiency S: history of gastric bypass . have recommended b12 1000 mcg per day- he prefers injections Lab Results  Component Value Date   VITAMINB12  >1500 (H) 11/05/2019   A/P: had injection today- adequately repleted- wants to continue monthly injections  #Hyperlipidemia S: Controlled on simvastatin 40 mg-half of 80 mg tablet  [facility] also added gemfibrozil with triglycerides 379 Lab Results  Component Value Date   CHOL 117 01/26/2020   HDL 50 01/26/2020   LDLCALC 95 01/26/2020   LDLDIRECT 47.0 11/05/2019   TRIG 203 (A) 01/26/2020   CHOLHDL 2 07/16/2017  A/P: target LDL at least under 100- update lipids today- continue current meds  %  Patient with known CLL-continues to follow with Dr. Alen Blew.  Stable on last check   Recommended follow up:  4  months follow up Future Appointments  Date Time Provider [place]  06/12/2020  7:30 AM Hayden Pedro, MD [facility] None  07/11/2020  9:00 AM [facility] NURSE [facility]  08/25/2020  2:30 PM [facility] LAB 1 [facility] None  08/25/2020  3:00 PM Wyatt Portela, MD [facility] None  09/11/2020 10:15 AM [name], MD [facility]  10/10/2020  2:40 PM Yong Channel, [name], MD [facility]    Lab/Order associations:   ICD-10-CM   1. Hypertension associated with diabetes ([place])  E11.59    I10   2. Type 2 diabetes mellitus with retinopathy of both eyes, with long-term current use of insulin, macular edema presence unspecified, unspecified retinopathy severity ([facility])  E11.319 Hemoglobin A1c   Z79.4   3. Hyperlipidemia associated with type 2 diabetes mellitus ([place])  E11.69    E78.5   4. Hypothyroidism due to acquired atrophy of thyroid  E03.4   5. B12 deficiency  E53.8 cyanocobalamin ((VITAMIN B-12)) injection 1,000 mcg  6. Encounter for hepatitis C screening test for low risk patient  Z11.59 Hepatitis C antibody    Meds ordered this encounter  Medications  . cyanocobalamin ((VITAMIN B-12)) injection 1,000 mcg    Return precautions advised.  [name], MD

## 2020-06-06 NOTE — Patient Instructions (Addendum)
  So glad you are doing so much better with your Bell's Palsy !!  Swelling: the tighter compression stockings will be better if you can get them on.   Blood pressure: Looks like you home readings are doing ok. Continue to check a few times a week. If you notice the numbers are high we might need to start back on a blood pressure medications. We would like for your readings to be less than 140/90 on average. And your heart rate should be between 60-100. If you do get constantly high numbers let our office know.   Only check your blood pressures if you have been sitting for 15 minutes and are not in pain.   Shaking: we do not want to increase your medications for that because it may lower your heart rate to much.    Diabetes We are checking your a1c today we will let you know if we need to make any changes when we get those results.     Please stop by lab before you go If you have mychart- we will send your results within 3 business days of [place] receiving them.  If you do not have mychart- we will call you about results within 5 business days of [place] receiving them.

## 2020-06-07 ENCOUNTER — Other Ambulatory Visit: Payer: Self-pay

## 2020-06-07 ENCOUNTER — Ambulatory Visit (INDEPENDENT_AMBULATORY_CARE_PROVIDER_SITE_OTHER): Payer: Medicare Other | Admitting: Family Medicine

## 2020-06-07 ENCOUNTER — Encounter: Payer: Self-pay | Admitting: Family Medicine

## 2020-06-07 VITALS — BP 136/68 | HR 63 | Temp 99.1°F | Ht 72.0 in | Wt 274.4 lb

## 2020-06-07 DIAGNOSIS — I10 Essential (primary) hypertension: Secondary | ICD-10-CM

## 2020-06-07 DIAGNOSIS — Z794 Long term (current) use of insulin: Secondary | ICD-10-CM | POA: Diagnosis not present

## 2020-06-07 DIAGNOSIS — E1159 Type 2 diabetes mellitus with other circulatory complications: Secondary | ICD-10-CM

## 2020-06-07 DIAGNOSIS — E1169 Type 2 diabetes mellitus with other specified complication: Secondary | ICD-10-CM | POA: Diagnosis not present

## 2020-06-07 DIAGNOSIS — E034 Atrophy of thyroid (acquired): Secondary | ICD-10-CM | POA: Diagnosis not present

## 2020-06-07 DIAGNOSIS — Z1159 Encounter for screening for other viral diseases: Secondary | ICD-10-CM

## 2020-06-07 DIAGNOSIS — E538 Deficiency of other specified B group vitamins: Secondary | ICD-10-CM | POA: Diagnosis not present

## 2020-06-07 DIAGNOSIS — E11319 Type 2 diabetes mellitus with unspecified diabetic retinopathy without macular edema: Secondary | ICD-10-CM | POA: Diagnosis not present

## 2020-06-07 DIAGNOSIS — I152 Hypertension secondary to endocrine disorders: Secondary | ICD-10-CM

## 2020-06-07 DIAGNOSIS — E785 Hyperlipidemia, unspecified: Secondary | ICD-10-CM

## 2020-06-07 LAB — HEMOGLOBIN A1C: Hgb A1c MFr Bld: 7.5 % — ABNORMAL HIGH (ref 4.6–6.5)

## 2020-06-07 MED ORDER — CYANOCOBALAMIN 1000 MCG/ML IJ SOLN
1000.0000 ug | Freq: Once | INTRAMUSCULAR | Status: AC
  Administered 2020-06-07: 1000 ug via INTRAMUSCULAR

## 2020-06-07 NOTE — Telephone Encounter (Signed)
FYI

## 2020-06-08 LAB — HEPATITIS C ANTIBODY
Hepatitis C Ab: NONREACTIVE
SIGNAL TO CUT-OFF: 0 (ref <1.00)

## 2020-06-12 ENCOUNTER — Encounter (INDEPENDENT_AMBULATORY_CARE_PROVIDER_SITE_OTHER): Payer: Medicare Other | Admitting: Ophthalmology

## 2020-06-12 ENCOUNTER — Other Ambulatory Visit: Payer: Self-pay

## 2020-06-12 DIAGNOSIS — I10 Essential (primary) hypertension: Secondary | ICD-10-CM

## 2020-06-12 DIAGNOSIS — H43813 Vitreous degeneration, bilateral: Secondary | ICD-10-CM | POA: Diagnosis not present

## 2020-06-12 DIAGNOSIS — H353231 Exudative age-related macular degeneration, bilateral, with active choroidal neovascularization: Secondary | ICD-10-CM

## 2020-06-12 DIAGNOSIS — H35033 Hypertensive retinopathy, bilateral: Secondary | ICD-10-CM

## 2020-07-10 LAB — HEMOGLOBIN A1C: Hemoglobin A1C: 7.4

## 2020-07-11 ENCOUNTER — Other Ambulatory Visit: Payer: Self-pay

## 2020-07-11 ENCOUNTER — Ambulatory Visit (INDEPENDENT_AMBULATORY_CARE_PROVIDER_SITE_OTHER): Payer: Medicare Other

## 2020-07-11 DIAGNOSIS — E538 Deficiency of other specified B group vitamins: Secondary | ICD-10-CM

## 2020-07-11 MED ORDER — CYANOCOBALAMIN 1000 MCG/ML IJ SOLN
1000.0000 ug | Freq: Once | INTRAMUSCULAR | Status: AC
  Administered 2020-07-11: 1000 ug via INTRAMUSCULAR

## 2020-07-11 NOTE — Progress Notes (Signed)
Patient came into the office to receive his Vitamin B12 shot. Patient tolerated his injection well.

## 2020-07-11 NOTE — Progress Notes (Signed)
I have reviewed and agree with note, evaluation, plan.   Khari Mally, MD  

## 2020-07-12 ENCOUNTER — Encounter: Payer: Self-pay | Admitting: Family Medicine

## 2020-07-13 ENCOUNTER — Encounter: Payer: Self-pay | Admitting: Family Medicine

## 2020-07-14 ENCOUNTER — Telehealth: Payer: Self-pay | Admitting: Oncology

## 2020-07-14 NOTE — Telephone Encounter (Signed)
Rescheduled 09/24 appointment to 09/08 per provider pal, patient has been called and notified.

## 2020-07-25 ENCOUNTER — Encounter: Payer: Self-pay | Admitting: Family Medicine

## 2020-07-25 ENCOUNTER — Other Ambulatory Visit: Payer: Self-pay

## 2020-07-25 ENCOUNTER — Ambulatory Visit (INDEPENDENT_AMBULATORY_CARE_PROVIDER_SITE_OTHER): Payer: Medicare Other | Admitting: Family Medicine

## 2020-07-25 VITALS — BP 140/80 | HR 93 | Temp 98.1°F | Ht 73.0 in | Wt 263.2 lb

## 2020-07-25 DIAGNOSIS — E1159 Type 2 diabetes mellitus with other circulatory complications: Secondary | ICD-10-CM

## 2020-07-25 DIAGNOSIS — B9689 Other specified bacterial agents as the cause of diseases classified elsewhere: Secondary | ICD-10-CM | POA: Diagnosis not present

## 2020-07-25 DIAGNOSIS — H9202 Otalgia, left ear: Secondary | ICD-10-CM

## 2020-07-25 DIAGNOSIS — J329 Chronic sinusitis, unspecified: Secondary | ICD-10-CM | POA: Diagnosis not present

## 2020-07-25 DIAGNOSIS — Z794 Long term (current) use of insulin: Secondary | ICD-10-CM

## 2020-07-25 DIAGNOSIS — I152 Hypertension secondary to endocrine disorders: Secondary | ICD-10-CM

## 2020-07-25 DIAGNOSIS — E11319 Type 2 diabetes mellitus with unspecified diabetic retinopathy without macular edema: Secondary | ICD-10-CM | POA: Diagnosis not present

## 2020-07-25 DIAGNOSIS — I10 Essential (primary) hypertension: Secondary | ICD-10-CM

## 2020-07-25 MED ORDER — AMOXICILLIN-POT CLAVULANATE 875-125 MG PO TABS
1.0000 | ORAL_TABLET | Freq: Two times a day (BID) | ORAL | 0 refills | Status: DC
Start: 2020-07-25 — End: 2020-11-27

## 2020-07-25 NOTE — Progress Notes (Signed)
Phone [phone] In person visit   Subjective:   [name] is a 79 y.o. year old very pleasant male patient who presents for/with See problem oriented charting Chief Complaint  Patient presents with  . Ear Pain    Left ear having ear pain for the last four days   This visit occurred during the SARS-CoV-2 public health emergency.  Safety protocols were in place, including screening questions prior to the visit, additional usage of staff PPE, and extensive cleaning of exam room while observing appropriate contact time as indicated for disinfecting solutions.   Past Medical History-  Patient Active Problem List   Diagnosis Date Noted  . Chronic bilateral low back pain without sciatica 12/12/2015    Priority: High  . Chronic lymphocytic leukemia ([place]) 05/16/2014    Priority: High  . History of prostate cancer 01/24/2014    Priority: High  . 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 Diabetic retinopathy ([place]) 09/24/2007    Priority: High  . Tremor 08/11/2015    Priority: Medium  . Hypothyroidism     Priority: Medium  . 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 Insomnia 09/21/2009    Priority: Medium  . Hyperlipidemia associated with type 2 diabetes mellitus ([place]) 09/24/2007    Priority: Medium  . OSA on CPAP 09/24/2007    Priority: Medium  . Hypertension associated with diabetes ([place]) 09/24/2007    Priority: Medium  . Personal history of skin cancer 04/02/2017    Priority: Low  . GERD (gastroesophageal reflux disease) 12/08/2014    Priority: Low  . Morbid obesity ([place]) 10/25/2008    Priority: Low  . History of colonic polyps 08/22/2008    Priority: Low  . Bell's palsy 05/08/2020  . Senile purpura ([place]) 05/02/2020  . 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 Ear pressure, left 04/14/2017  . Temporomandibular joint (TMJ) pain 04/14/2017  . Pain syndrome, chronic 02/01/2016  . Spondylosis of  lumbar region without myelopathy or radiculopathy 02/01/2016    Medications- reviewed and updated Current Outpatient Medications  Medication Sig Dispense Refill  . fenofibrate 160 MG tablet Take 160 mg by mouth daily.    Marland Kitchen glucose blood test strip Use to test 4 times daily. E11.9 100 each 12  . hydrochlorothiazide (HYDRODIURIL) 25 MG tablet Take 25 mg by mouth daily.    . insulin aspart (NOVOLOG) 100 UNIT/ML injection Inject 45 Units into the skin 3 (three) times daily with meals. When sugar is elevated    . insulin glargine (LANTUS) 100 UNIT/ML injection Inject 102 Units into the skin at bedtime.     . lansoprazole (PREVACID) 30 MG capsule Take 1 capsule (30 mg total) by mouth daily at 12 noon. 30 capsule 5  . levothyroxine (SYNTHROID, LEVOTHROID) 50 MCG tablet Take 50 mcg by mouth daily before breakfast.     . losartan (COZAAR) 100 MG tablet Take 100 mg by mouth daily.    . meclizine (ANTIVERT) 25 MG tablet Take 25 mg by mouth every 6 (six) hours.    . mirtazapine (REMERON) 15 MG tablet Take 15 mg by mouth at bedtime.    Marland Kitchen OVER THE COUNTER MEDICATION PreserVision for eyes    . potassium chloride SA (K-DUR,KLOR-CON) 20 MEQ tablet Take 20 mEq by mouth daily.    . propranolol (INDERAL) 40 MG tablet Take 1 tablet (40 mg total) by mouth 2 (two) times daily. 180 tablet 3  .  simvastatin (ZOCOR) 40 MG tablet Take 40 mg by mouth daily. Through [facility]- dose unclear but taking 1/2 tablet    . sucralfate (CARAFATE) 1 G tablet Take 1 g by mouth 4 (four) times daily -  with meals and at bedtime.    . traMADol (ULTRAM) 50 MG tablet Take 50 mg by mouth every 6 (six) hours as needed. for pain  0  . amoxicillin-clavulanate (AUGMENTIN) 875-125 MG tablet Take 1 tablet by mouth 2 (two) times daily. 20 tablet 0   No current facility-administered medications for this visit.     Objective:  BP 140/80   Pulse 93   Temp 98.1 F (36.7 C) (Temporal)   Ht 6\' 1"  (1.854 m)   Wt 263 lb 3.2 oz (119.4 kg)   SpO2 91%    BMI 34.73 kg/m  noted oxygen level after visit- did not appear in any respiratory distress Gen: NAD, resting comfortably Tympanic membrane obstructed on the left by hard wax-deep and unable to curette this out.  No obvious swelling behind the ear-some tenderness over mastoid bone-no erythema/rash in fold behind the ear CV: RRR no murmurs rubs or gallops Lungs: CTAB no crackles, wheeze, rhonchi     Assessment and Plan   # Left Ear pain x 4 days /ongoing sinus pressure for months S: Left ear pain x 4 days. Mainly hurts in crease of ear and just behind the left ear. Was really significant over the weekend but up to 50% better at present.   Even though ear pain is improving patient has had sinus congestion for several months despite Flonase and intermittent Claritin-D (advised against anything with a decongestant due to hypertension).  Primarily clear congestion.  Intermittent sinus pressure but just cannot seem to get rid of this A/P:  From AVS:  " your left ear has a fair amount of wax in it that is very firm and I cannot get a good look in the ears-lets try mineral oil to see if we can loosen some of this up in case we need another look in the ears.  Since you have been having ongoing issues with your sinuses despite taking regular allergy medicine like Flonase (please make sure you are taking 2 puffs a day each nostril) I am concerned you could have some ongoing sinus irritation/sinus infection-I am going to treat you with Augmentin twice a day for 10 days-make sure to take this with food as it can cause loose stools and stomach upset.  If there were an ear infection this should also cover the ear infection.  The only thing this would not cover would be if there was an infection in the bone behind the ear-I do not think you have an infection in this area since your symptoms are getting better slightly-typically an infection in this area would cause high fevers and continued worsening symptoms.   If you develop fevers after starting antibiotics or have worsening pain please let me know immediately" - this could be related to allergies but with ongoing issues despite treatment and ongoing intermittent sinus pain thought reasonable to treat for bacterial sinusitis   #Hypertension S: Controlled on hydrochlorothiazide 25 mg, losartan 100 mg, propranolol 40 mg twice a day -  off amlodipine 5 mg (was on 10mg  05/02/20 but he wanted to stop due to edema) BP Readings from Last 3 Encounters:  07/25/20 140/80  06/07/20 136/68  05/08/20 124/62  A/P:  Very slightly high today but in some discomfort-controlled at last visit-we will  recheck at follow-up but continue current meds for now.   #Diabetes type 2 with ophthalmic complications- lifestyle meterb S: Compliant with Lantus up to 100 units.  Also uses short acting insulin up to 45 units before meals.  A1c goal 7.5 or less.  Now [facility] and started on Ozempic  A/P: Patient has not yet started Ozempic-he wanted to get my approval before starting this.  I told him as long as he follows the [facility]'s instructions this is reasonable- I showed him how to use the dial to adjust dose and reiterated using 0.25mg  dose for 4 weeks to start then increasing to 0.5mg    Recommended follow up: Return for as needed for new, worsening, persistent symptoms. Future Appointments  Date Time Provider [place]  07/31/2020  7:30 AM Hayden Pedro, MD [facility] None  08/08/2020  9:15 AM [facility] NURSE [facility] PEC  08/09/2020  8:00 AM [facility] LAB [facility] None  08/09/2020  8:30 AM Wyatt Portela, MD [facility] None  09/11/2020 10:15 AM [name], MD [facility] CD[facility]  10/31/2020 11:20 AM Yong Channel, [name], MD [facility] PEC    Lab/Order associations:   ICD-10-CM   1. Left ear pain  H92.02   2. Bacterial sinusitis  J32.9    B96.89   3. Type 2 diabetes mellitus with retinopathy of both eyes, with long-term current use of insulin,  macular edema presence unspecified, unspecified retinopathy severity ([place])  E11.319    Z79.4   4. Hypertension associated with diabetes ([place])  E11.59    I10    Meds ordered this encounter  Medications  . amoxicillin-clavulanate (AUGMENTIN) 875-125 MG tablet    Sig: Take 1 tablet by mouth 2 (two) times daily.    Dispense:  20 tablet    Refill:  0   Return precautions advised.  [name], MD

## 2020-07-25 NOTE — Patient Instructions (Addendum)
Mineral oil for ear full of wax Purchase mineral oil from laxative aisle Lay down on your side with ear that is bothering you facing up Use 3-4 drops with a dropper and place in ear for 30 seconds Place cotton swab outside of ear Turn to other side and allow this to drain Repeat 3-4 x a day Return to see [place] if not improving within a few days  Your left ear has a fair amount of wax in it that is very firm and I cannot get a good look in the ears-lets try mineral oil to see if we can loosen some of this up in case we need another look in the ears.  Since you have been having ongoing issues with your sinuses despite taking regular allergy medicine like Flonase (please make sure you are taking 2 puffs a day each nostril) I am concerned you could have some ongoing sinus irritation/sinus infection-I am going to treat you with Augmentin twice a day for 10 days-make sure to take this with food as it can cause loose stools and stomach upset.  If there were an ear infection this should also cover the ear infection.  The only thing this would not cover would be if there was an infection in the bone behind the ear-I do not think you have an infection in this area since your symptoms are getting better slightly-typically an infection in this area would cause high fevers and continued worsening symptoms.  If you develop fevers after starting antibiotics or have worsening pain please let me know immediately. Also let me know if symptoms do not resolve within the 10 days.

## 2020-07-25 NOTE — Telephone Encounter (Signed)
Pt is scheduled for 11:40

## 2020-07-28 ENCOUNTER — Ambulatory Visit: Payer: Medicare Other | Admitting: Family Medicine

## 2020-07-31 ENCOUNTER — Other Ambulatory Visit: Payer: Self-pay

## 2020-07-31 ENCOUNTER — Encounter (INDEPENDENT_AMBULATORY_CARE_PROVIDER_SITE_OTHER): Payer: Medicare Other | Admitting: Ophthalmology

## 2020-07-31 DIAGNOSIS — H353231 Exudative age-related macular degeneration, bilateral, with active choroidal neovascularization: Secondary | ICD-10-CM | POA: Diagnosis not present

## 2020-07-31 DIAGNOSIS — H35033 Hypertensive retinopathy, bilateral: Secondary | ICD-10-CM | POA: Diagnosis not present

## 2020-07-31 DIAGNOSIS — H43813 Vitreous degeneration, bilateral: Secondary | ICD-10-CM | POA: Diagnosis not present

## 2020-07-31 DIAGNOSIS — I10 Essential (primary) hypertension: Secondary | ICD-10-CM

## 2020-08-08 ENCOUNTER — Other Ambulatory Visit: Payer: Self-pay

## 2020-08-08 ENCOUNTER — Ambulatory Visit (INDEPENDENT_AMBULATORY_CARE_PROVIDER_SITE_OTHER): Payer: Medicare Other

## 2020-08-08 DIAGNOSIS — Z23 Encounter for immunization: Secondary | ICD-10-CM | POA: Diagnosis not present

## 2020-08-08 DIAGNOSIS — E538 Deficiency of other specified B group vitamins: Secondary | ICD-10-CM

## 2020-08-08 MED ORDER — CYANOCOBALAMIN 1000 MCG/ML IJ SOLN
1000.0000 ug | Freq: Once | INTRAMUSCULAR | Status: AC
  Administered 2020-08-09: 1000 ug via INTRAMUSCULAR

## 2020-08-08 NOTE — Progress Notes (Addendum)
Per orders of [name], [place], injection of B-12 given by Francella Solian in right deltoid. Patient tolerated injection well. Patient will make appointment for 1 month. Patient also requested to have flu shot given today. He has had his booster covid vaccination but has been over 2 weeks. This was given in left deltoid.   I agree with the above information and plan.  David Coke PA-C

## 2020-08-09 ENCOUNTER — Inpatient Hospital Stay: Payer: Medicare Other | Attending: Oncology

## 2020-08-09 ENCOUNTER — Inpatient Hospital Stay (HOSPITAL_BASED_OUTPATIENT_CLINIC_OR_DEPARTMENT_OTHER): Payer: Medicare Other | Admitting: Oncology

## 2020-08-09 VITALS — BP 147/79 | HR 95 | Temp 98.0°F | Resp 20 | Ht 73.0 in | Wt 264.0 lb

## 2020-08-09 DIAGNOSIS — Z9079 Acquired absence of other genital organ(s): Secondary | ICD-10-CM | POA: Diagnosis not present

## 2020-08-09 DIAGNOSIS — Z79899 Other long term (current) drug therapy: Secondary | ICD-10-CM | POA: Diagnosis not present

## 2020-08-09 DIAGNOSIS — Z8546 Personal history of malignant neoplasm of prostate: Secondary | ICD-10-CM | POA: Diagnosis not present

## 2020-08-09 DIAGNOSIS — E538 Deficiency of other specified B group vitamins: Secondary | ICD-10-CM | POA: Diagnosis not present

## 2020-08-09 DIAGNOSIS — Z794 Long term (current) use of insulin: Secondary | ICD-10-CM | POA: Insufficient documentation

## 2020-08-09 DIAGNOSIS — C9110 Chronic lymphocytic leukemia of B-cell type not having achieved remission: Secondary | ICD-10-CM

## 2020-08-09 DIAGNOSIS — R251 Tremor, unspecified: Secondary | ICD-10-CM | POA: Diagnosis not present

## 2020-08-09 LAB — CBC WITH DIFFERENTIAL (CANCER CENTER ONLY)
Abs Immature Granulocytes: 0 10*3/uL (ref 0.00–0.07)
Basophils Absolute: 0 10*3/uL (ref 0.0–0.1)
Basophils Relative: 0 %
Eosinophils Absolute: 0.3 10*3/uL (ref 0.0–0.5)
Eosinophils Relative: 2 %
HCT: 45.5 % (ref 39.0–52.0)
Hemoglobin: 15.1 g/dL (ref 13.0–17.0)
Lymphocytes Relative: 45 %
Lymphs Abs: 6.9 10*3/uL — ABNORMAL HIGH (ref 0.7–4.0)
MCH: 28.2 pg (ref 26.0–34.0)
MCHC: 33.2 g/dL (ref 30.0–36.0)
MCV: 85 fL (ref 80.0–100.0)
Monocytes Absolute: 0.6 10*3/uL (ref 0.1–1.0)
Monocytes Relative: 4 %
Neutro Abs: 7.5 10*3/uL (ref 1.7–7.7)
Neutrophils Relative %: 49 %
Platelet Count: 213 10*3/uL (ref 150–400)
RBC: 5.35 MIL/uL (ref 4.22–5.81)
RDW: 14.1 % (ref 11.5–15.5)
WBC Count: 15.3 10*3/uL — ABNORMAL HIGH (ref 4.0–10.5)
nRBC: 0 % (ref 0.0–0.2)

## 2020-08-09 NOTE — Progress Notes (Signed)
Hematology and Oncology Follow Up Visit  [name] [id number] January 01, 1941 79 y.o. 08/09/2020 7:35 AM Yong Channel, [name], MDHunter, [name], MD   Principle Diagnosis: 79 year old man with CLL diagnosed in 2015.  He presented with small lymphadenopathy after prostate surgery.  He has not required treatment since that time.   Secondary diagnosis: Prostate cancer diagnosed in 2015.  He was found to have stage T1c, Gleason score 3+4 = 7 PSA of 6.8 and remains disease-free.  Prior Therapy: Status post robotic prostatectomy in February 2015 with the pathology revealed a Gleason score 3+4 = 7 without any evidence of extraprostatic extension.   Current therapy: Active surveillance.  Interim History:  [name] is here for follow-up.  Since the last visit, he reports no major changes in his health.  Continues to be ambulatory without any recent falls or syncope.  He does have some tremors which are manageable at this time.  He does have a walker but rarely uses it less is a long distance walk.  He denies any lymphadenopathy, early satiety or constitutional symptoms.  His performance status quality of life remained reasonable..     Medications: Unchanged on review. Current Outpatient Medications  Medication Sig Dispense Refill  . amoxicillin-clavulanate (AUGMENTIN) 875-125 MG tablet Take 1 tablet by mouth 2 (two) times daily. 20 tablet 0  . fenofibrate 160 MG tablet Take 160 mg by mouth daily.    Marland Kitchen glucose blood test strip Use to test 4 times daily. E11.9 100 each 12  . hydrochlorothiazide (HYDRODIURIL) 25 MG tablet Take 25 mg by mouth daily.    . insulin aspart (NOVOLOG) 100 UNIT/ML injection Inject 45 Units into the skin 3 (three) times daily with meals. When sugar is elevated    . insulin glargine (LANTUS) 100 UNIT/ML injection Inject 102 Units into the skin at bedtime.     . lansoprazole (PREVACID) 30 MG capsule Take 1 capsule (30 mg total) by mouth daily at 12 noon. 30 capsule 5  .  levothyroxine (SYNTHROID, LEVOTHROID) 50 MCG tablet Take 50 mcg by mouth daily before breakfast.     . losartan (COZAAR) 100 MG tablet Take 100 mg by mouth daily.    . meclizine (ANTIVERT) 25 MG tablet Take 25 mg by mouth every 6 (six) hours.    . mirtazapine (REMERON) 15 MG tablet Take 15 mg by mouth at bedtime.    Marland Kitchen OVER THE COUNTER MEDICATION PreserVision for eyes    . potassium chloride SA (K-DUR,KLOR-CON) 20 MEQ tablet Take 20 mEq by mouth daily.    . propranolol (INDERAL) 40 MG tablet Take 1 tablet (40 mg total) by mouth 2 (two) times daily. 180 tablet 3  . simvastatin (ZOCOR) 40 MG tablet Take 40 mg by mouth daily. Through [facility]- dose unclear but taking 1/2 tablet    . sucralfate (CARAFATE) 1 G tablet Take 1 g by mouth 4 (four) times daily -  with meals and at bedtime.    . traMADol (ULTRAM) 50 MG tablet Take 50 mg by mouth every 6 (six) hours as needed. for pain  0   Current Facility-Administered Medications  Medication Dose Route Frequency Provider Last Rate Last Admin  . cyanocobalamin ((VITAMIN B-12)) injection 1,000 mcg  1,000 mcg Intramuscular Once Inda Coke, [place]         Allergies:  Allergies  Allergen Reactions  . Morphine Sulfate Itching and Rash       Physical exam: Blood pressure (!) 147/79, pulse 95, temperature 98 F (36.7  C), temperature source Tympanic, resp. rate 20, height 6\' 1"  (1.854 m), weight 264 lb (119.7 kg), SpO2 96 %.    ECOG: 1   General appearance: Comfortable appearing without any discomfort Head: Normocephalic without any trauma Oropharynx: Mucous membranes are moist and pink without any thrush or ulcers. Eyes: Pupils are equal and round reactive to light. Lymph nodes: No cervical, supraclavicular, inguinal or axillary lymphadenopathy.   Heart:regular rate and rhythm.  S1 and S2 without leg edema. Lung: Clear without any rhonchi or wheezes.  No dullness to percussion. Abdomin: Soft, nontender, nondistended with good bowel sounds.  No  hepatosplenomegaly. Musculoskeletal: No joint deformity or effusion.  Full range of motion noted. Neurological: No deficits noted on motor, sensory and deep tendon reflex exam. Skin: No petechial rash or dryness.  Appeared moist.    .   Lab Results: Lab Results  Component Value Date   WBC 17.9 Repeated and verified X2. (H) 05/02/2020   HGB 14.9 05/02/2020   HCT 44.6 05/02/2020   MCV 84.6 05/02/2020   PLT 214.0 05/02/2020     Chemistry      Component Value Date/Time   NA 139 05/02/2020 1323   NA 140 01/26/2020 0000   NA 142 10/30/2017 0845   K 4.2 05/02/2020 1323   K 3.7 10/30/2017 0845   CL 100 05/02/2020 1323   CO2 30 05/02/2020 1323   CO2 28 10/30/2017 0845   BUN 12 05/02/2020 1323   BUN 10 01/26/2020 0000   BUN 13.3 10/30/2017 0845   CREATININE 0.85 05/02/2020 1323   CREATININE 1.08 08/26/2019 0908   CREATININE 1.2 10/30/2017 0845   GLU 73 01/26/2020 0000      Component Value Date/Time   CALCIUM 9.7 05/02/2020 1323   CALCIUM 9.5 10/30/2017 0845   ALKPHOS 58 05/02/2020 1323   ALKPHOS 53 10/30/2017 0845   AST 30 05/02/2020 1323   AST 31 08/26/2019 0908   AST 37 (H) 10/30/2017 0845   ALT 27 05/02/2020 1323   ALT 29 08/26/2019 0908   ALT 37 10/30/2017 0845   BILITOT 1.4 (H) 05/02/2020 1323   BILITOT 1.3 (H) 08/26/2019 0908   BILITOT 1.23 (H) 10/30/2017 0845         Impression and Plan:  79 year old man:    1.  Chronic lymphocytic lymphoma diagnosed in 2015.  He was found to have small lymphadenopathy and leukocytosis associated with lymphocytosis at that time.    He has been on active surveillance without any indication for treatment.  Laboratory data in the last year reviewed and continues to show lymphocytosis which has been stable for an extended period of time.  The natural course of this disease was reviewed and indication for treatment for this condition were discussed.  These would include painful adenopathy, rapid rise in his white cell count or  cytopenias and bone marrow involvement.  His treatments include systemic therapy with oral targeted therapy as well as chemoimmunotherapy.  At this time I see no indication for treatment and continued active surveillance is warranted.   2.  Prostate cancer presented with Gleason score of 3+4 = 7 in 2015.    He is currently on active surveillance after surgical resection with prostatectomy and PSA is undetectable.  Continues to follow with urology regarding this issue.   3. Follow-up: Return in 1 year.    30 minutes were dedicated to this encounter.  The time was spent on updating his disease status, reviewing laboratory data, discussing indication for treatment  and treatment options for the future.    Zola Button, MD 9/8/20217:35 AM

## 2020-08-25 ENCOUNTER — Ambulatory Visit: Payer: Medicare Other | Admitting: Oncology

## 2020-08-25 ENCOUNTER — Other Ambulatory Visit: Payer: Medicare Other

## 2020-09-05 ENCOUNTER — Ambulatory Visit: Payer: Medicare Other

## 2020-09-06 ENCOUNTER — Ambulatory Visit (INDEPENDENT_AMBULATORY_CARE_PROVIDER_SITE_OTHER): Payer: Medicare Other

## 2020-09-06 ENCOUNTER — Other Ambulatory Visit: Payer: Self-pay

## 2020-09-06 DIAGNOSIS — E538 Deficiency of other specified B group vitamins: Secondary | ICD-10-CM | POA: Diagnosis not present

## 2020-09-06 MED ORDER — CYANOCOBALAMIN 1000 MCG/ML IJ SOLN
1000.0000 ug | Freq: Once | INTRAMUSCULAR | Status: AC
  Administered 2020-09-06: 1000 ug via INTRAMUSCULAR

## 2020-09-06 NOTE — Progress Notes (Signed)
I have reviewed and agree with note, evaluation, plan.   Icelynn Onken, MD  

## 2020-09-06 NOTE — Progress Notes (Signed)
[name] 79 y.o. male presents to office today for monthly B12 injection per [name], MD. Administered CYANOCOBALAMIN 1,000 mcg/ 1 mL IM left arm. Patient tolerated well. Aware to return next month for his next injection.

## 2020-09-11 ENCOUNTER — Other Ambulatory Visit: Payer: Self-pay

## 2020-09-11 ENCOUNTER — Telehealth: Payer: Self-pay | Admitting: Dermatology

## 2020-09-11 ENCOUNTER — Encounter: Payer: Self-pay | Admitting: Dermatology

## 2020-09-11 ENCOUNTER — Ambulatory Visit (INDEPENDENT_AMBULATORY_CARE_PROVIDER_SITE_OTHER): Payer: Medicare Other | Admitting: Dermatology

## 2020-09-11 DIAGNOSIS — D1801 Hemangioma of skin and subcutaneous tissue: Secondary | ICD-10-CM | POA: Diagnosis not present

## 2020-09-11 DIAGNOSIS — Z1283 Encounter for screening for malignant neoplasm of skin: Secondary | ICD-10-CM | POA: Diagnosis not present

## 2020-09-11 DIAGNOSIS — L738 Other specified follicular disorders: Secondary | ICD-10-CM | POA: Diagnosis not present

## 2020-09-11 DIAGNOSIS — L309 Dermatitis, unspecified: Secondary | ICD-10-CM

## 2020-09-11 NOTE — Telephone Encounter (Signed)
See patient note

## 2020-09-11 NOTE — Telephone Encounter (Signed)
Patient's wife, Loyola Mast, is calling at [name], MD's request to let him know the name of the medication that Garett has been using.  The medication is Clobetasol Proprionate 15 mg topical solution 0.05%.

## 2020-09-11 NOTE — Patient Instructions (Addendum)
Follow-up visit for [name] date of birth 26-Jun-1941.  All skin from the waist up examined and there are no current skin cancers, atypical moles, or melanoma.  [name] has multiple tan textured keratoses on the back.  One on the left inner shoulder is more pink and has been present for several years; this fits an inflamed keratosis and unless it changes does not require removal.  On his lower central left lip is a soft blue bubble called a venous lake.  On the forehead are multiple flesh-colored 3 mm bumps called sebaceous hyperplasia.  These do not require freezing or biopsy.  Historically he has had some scale and itching around the ears and the scalp for which he was given a prescription solution which has helped.  I asked [name] to please call the office with the name of this [place] prescription.  If it stops working he knows he can call me and I will be happy to phone in for stronger medication. Addendum: He was given clobetasol solution which is likely to help control his itchy rash.  Ideally this would not be used every day and should never be used around the eyes.

## 2020-09-12 NOTE — Telephone Encounter (Signed)
I recorded this on his encounter visit note.

## 2020-09-12 NOTE — Progress Notes (Addendum)
   Follow-Up Visit   Subjective  [name] is a 79 y.o. male who presents for the following: Annual Exam ( ECZEMA? BOTH EARS ITCH AND SCALE [facility] GAVE CLOBETASOL SOLUTION ITS HELPS).  rash Location:  Duration:  Quality:  Associated Signs/Symptoms: Modifying Factors:  Severity:  Timing: Context:   Objective  Well appearing patient in no apparent distress; mood and affect are within normal limits.  All skin waist up examined.   Assessment & Plan   Family called after visit to them inform us that he had been using topical clobetasol on his rash. Dermatitis (2) Left Parietal Scalp; Right Parietal Scalp  He may continue using his clobetasol solution for flares, but should avoid using this every day and should never use this around the eyes.  Encounter for screening for malignant neoplasm of skin Mid Back  Annual skin examination.   Follow-up visit for [name] date of birth September 03, 1941.  All skin from the waist up examined and there are no current skin cancers, atypical moles, or melanoma.  [name] has multiple tan textured keratoses on the back.  One on the left inner shoulder is more pink and has been present for several years; this fits an inflamed keratosis and unless it changes does not require removal.  On his lower central left lip is a soft blue bubble called a venous lake.  On the forehead are multiple flesh-colored 3 mm bumps called sebaceous hyperplasia.  These do not require freezing or biopsy.  Historically he has had some scale and itching around the ears and the scalp for which he was given a prescription solution which has helped.  I asked [name] to please call the office with the name of this [place] prescription.  If it stops working he knows he can call me and I will be happy to phone in for stronger medication. Addendum: He was given clobetasol solution which is likely to help control his itchy rash.  Ideally this would not be used every day and should  never be used around the eyes.   I, [name], MD, have reviewed all documentation for this visit.  The documentation on 10/16/20 for the exam, diagnosis, procedures, and orders are all accurate and complete.

## 2020-09-13 ENCOUNTER — Encounter: Payer: Self-pay | Admitting: Dermatology

## 2020-09-18 LAB — BASIC METABOLIC PANEL
BUN: 13 (ref 4–21)
CO2: 28 — AB (ref 13–22)
Chloride: 103 (ref 99–108)
Creatinine: 1.2 (ref 0.6–1.3)
Glucose: 154
Potassium: 3.7 (ref 3.4–5.3)
Sodium: 137 (ref 137–147)

## 2020-09-18 LAB — CBC AND DIFFERENTIAL
HCT: 45 (ref 41–53)
Hemoglobin: 15.2 (ref 13.5–17.5)
Neutrophils Absolute: 37.9
Platelets: 235 (ref 150–399)
WBC: 18

## 2020-09-18 LAB — PSA: PSA: <0.01

## 2020-09-18 LAB — HEMOGLOBIN A1C: Hemoglobin A1C: 6.9

## 2020-09-18 LAB — CBC: RBC: 5.37 — AB (ref 3.87–5.11)

## 2020-09-18 LAB — VITAMIN D 25 HYDROXY (VIT D DEFICIENCY, FRACTURES): Vit D, 25-Hydroxy: 26.8

## 2020-09-21 ENCOUNTER — Encounter: Payer: Self-pay | Admitting: Family Medicine

## 2020-09-25 ENCOUNTER — Encounter (INDEPENDENT_AMBULATORY_CARE_PROVIDER_SITE_OTHER): Payer: Medicare Other | Admitting: Ophthalmology

## 2020-09-25 ENCOUNTER — Other Ambulatory Visit: Payer: Self-pay

## 2020-09-25 DIAGNOSIS — H43813 Vitreous degeneration, bilateral: Secondary | ICD-10-CM | POA: Diagnosis not present

## 2020-09-25 DIAGNOSIS — I10 Essential (primary) hypertension: Secondary | ICD-10-CM | POA: Diagnosis not present

## 2020-09-25 DIAGNOSIS — H353231 Exudative age-related macular degeneration, bilateral, with active choroidal neovascularization: Secondary | ICD-10-CM

## 2020-09-25 DIAGNOSIS — H35033 Hypertensive retinopathy, bilateral: Secondary | ICD-10-CM | POA: Diagnosis not present

## 2020-09-27 ENCOUNTER — Ambulatory Visit (INDEPENDENT_AMBULATORY_CARE_PROVIDER_SITE_OTHER): Payer: Medicare Other

## 2020-09-27 ENCOUNTER — Other Ambulatory Visit: Payer: Self-pay

## 2020-09-27 DIAGNOSIS — E538 Deficiency of other specified B group vitamins: Secondary | ICD-10-CM | POA: Diagnosis not present

## 2020-09-27 MED ORDER — CYANOCOBALAMIN 1000 MCG/ML IJ SOLN
1000.0000 ug | Freq: Once | INTRAMUSCULAR | Status: AC
  Administered 2020-09-27: 1000 ug via INTRAMUSCULAR

## 2020-09-27 NOTE — Progress Notes (Signed)
[name] 79 y.o. male presents to office today for B12 injection per [name], MD. Administered CYANOCOBALAMIN 1,000 mcg/mL IM left arm. Patient tolerated well.

## 2020-10-06 ENCOUNTER — Telehealth: Payer: Self-pay | Admitting: Family Medicine

## 2020-10-06 NOTE — Progress Notes (Signed)
  Chronic Care Management   Outreach Note  10/06/2020 Name: CHANDLOR NOECKER MRN: [id number] DOB: [birth year]  Referred by: [name], MD Reason for referral : No chief complaint on file.   An unsuccessful telephone outreach was attempted today. The patient was referred to the pharmacist for assistance with care management and care coordination.   Follow Up Plan:    Lauretta Grill Upstream Scheduler

## 2020-10-10 ENCOUNTER — Ambulatory Visit: Payer: Medicare Other | Admitting: Family Medicine

## 2020-10-12 ENCOUNTER — Telehealth: Payer: Self-pay | Admitting: Family Medicine

## 2020-10-12 NOTE — Chronic Care Management (AMB) (Signed)
  Chronic Care Management   Outreach Note  10/12/2020 Name: [name] MRN: [id number] DOB: [birth year]  Referred by: [name], MD Reason for referral : No chief complaint on file.   A second unsuccessful telephone outreach was attempted today. The patient was referred to pharmacist for assistance with care management and care coordination.  Follow Up Plan:   Lauretta Grill Upstream Scheduler

## 2020-10-17 ENCOUNTER — Telehealth: Payer: Self-pay | Admitting: Family Medicine

## 2020-10-17 NOTE — Chronic Care Management (AMB) (Signed)
  Chronic Care Management   Note  10/17/2020 Name: [name] MRN: [id number] DOB: 05/28/1941  [name] is a 79 y.o. year old male who is a primary care patient of [name], MD. I reached out to [name] by phone today in response to a referral sent by [name]'s PCP, [name], MD.   [name] was given information about Chronic Care Management services today including:  1. CCM service includes personalized support from designated clinical staff supervised by his physician, including individualized plan of care and coordination with other care providers 2. 24/7 contact phone numbers for assistance for urgent and routine care needs. 3. Service will only be billed when office clinical staff spend 20 minutes or more in a month to coordinate care. 4. Only one practitioner may furnish and bill the service in a calendar month. 5. The patient may stop CCM services at any time (effective at the end of the month) by phone call to the office staff.   Patient agreed to services and verbal consent obtained.   Follow up plan:   Lauretta Grill Upstream Scheduler

## 2020-10-24 ENCOUNTER — Ambulatory Visit (INDEPENDENT_AMBULATORY_CARE_PROVIDER_SITE_OTHER): Payer: Medicare Other

## 2020-10-24 DIAGNOSIS — E538 Deficiency of other specified B group vitamins: Secondary | ICD-10-CM | POA: Diagnosis not present

## 2020-10-24 DIAGNOSIS — Z8546 Personal history of malignant neoplasm of prostate: Secondary | ICD-10-CM | POA: Diagnosis not present

## 2020-10-24 MED ORDER — CYANOCOBALAMIN 1000 MCG/ML IJ SOLN
1000.0000 ug | Freq: Once | INTRAMUSCULAR | Status: AC
  Administered 2020-10-24: 1000 ug via INTRAMUSCULAR

## 2020-10-24 NOTE — Progress Notes (Signed)
I have reviewed and agree with note, evaluation, plan.   Knoah Nedeau, MD  

## 2020-10-24 NOTE — Progress Notes (Signed)
Patient came into the office to receive his vitamin b12 injection. He tolerated the injection well in his left arm. No questions or concerns at this time.

## 2020-10-30 NOTE — Progress Notes (Signed)
Phone [phone] In person visit   Subjective:   [name] is a 79 y.o. year old very pleasant male patient who presents for/with See problem oriented charting Chief Complaint  Patient presents with  . Diabetes    pt states that he checks his sugar serval times a day and they are running a little high but not too much out of range. states they aren't over 200  . Hypertension    checks BP at home every once in a while and he says the readings are normal.   This visit occurred during the SARS-CoV-2 public health emergency.  Safety protocols were in place, including screening questions prior to the visit, additional usage of staff PPE, and extensive cleaning of exam room while observing appropriate contact time as indicated for disinfecting solutions.   Past Medical History-  Patient Active Problem List   Diagnosis Date Noted  . Chronic bilateral low back pain without sciatica 12/12/2015    Priority: High  . Chronic lymphocytic leukemia ([place]) 05/16/2014    Priority: High  . History of prostate cancer 01/24/2014    Priority: High  . 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 Diabetic retinopathy ([place]) 09/24/2007    Priority: High  . Tremor 08/11/2015    Priority: Medium  . Hypothyroidism     Priority: Medium  . 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 Insomnia 09/21/2009    Priority: Medium  . Hyperlipidemia associated with type 2 diabetes mellitus ([place]) 09/24/2007    Priority: Medium  . OSA on CPAP 09/24/2007    Priority: Medium  . Hypertension associated with diabetes ([place]) 09/24/2007    Priority: Medium  . Personal history of skin cancer 04/02/2017    Priority: Low  . GERD (gastroesophageal reflux disease) 12/08/2014    Priority: Low  . History of colonic polyps 08/22/2008    Priority: Low  . Bell's palsy 05/08/2020  . Senile purpura ([place]) 05/02/2020  . Sacroiliac joint dysfunction 06/29/2018    . Ear pressure, left 04/14/2017  . Temporomandibular joint (TMJ) pain 04/14/2017  . Pain syndrome, chronic 02/01/2016  . 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 myelopathy or radiculopathy 02/01/2016    Medications- reviewed and updated Current Outpatient Medications  Medication Sig Dispense Refill  . amoxicillin-clavulanate (AUGMENTIN) 875-125 MG tablet Take 1 tablet by mouth 2 (two) times daily. 20 tablet 0  . clobetasol (TEMOVATE) 0.05 % external solution Apply topically.    . fenofibrate 160 MG tablet Take 160 mg by mouth daily.    Marland Kitchen glucose blood test strip Use to test 4 times daily. E11.9 100 each 12  . hydrochlorothiazide (HYDRODIURIL) 25 MG tablet Take 25 mg by mouth daily.    . insulin aspart (NOVOLOG) 100 UNIT/ML injection Inject 45 Units into the skin 3 (three) times daily with meals. When sugar is elevated    . insulin glargine (LANTUS) 100 UNIT/ML injection Inject 102 Units into the skin at bedtime.     . lansoprazole (PREVACID) 30 MG capsule Take 1 capsule (30 mg total) by mouth daily at 12 noon. 30 capsule 5  . levothyroxine (SYNTHROID, LEVOTHROID) 50 MCG tablet Take 50 mcg by mouth daily before breakfast.     . losartan (COZAAR) 100 MG tablet Take 100 mg by mouth daily.    . meclizine (ANTIVERT) 25 MG tablet Take 25 mg by mouth every 6 (six) hours.    . mirtazapine (REMERON)  15 MG tablet Take 15 mg by mouth at bedtime.    Marland Kitchen OVER THE COUNTER MEDICATION PreserVision for eyes    . potassium chloride SA (K-DUR,KLOR-CON) 20 MEQ tablet Take 20 mEq by mouth daily.    . Semaglutide (OZEMPIC, 0.25 OR 0.5 MG/DOSE, [place]) Inject 0.5 mg into the skin once a week.    . simvastatin (ZOCOR) 40 MG tablet Take 40 mg by mouth daily. [facility]- dose unclear but taking 1/2 tablet    . sucralfate (CARAFATE) 1 G tablet Take 1 g by mouth 4 (four) times daily -  with meals and at bedtime.    . traMADol (ULTRAM) 50 MG tablet Take 50 mg by mouth every 6 (six) hours as needed. for pain  0   No current  facility-administered medications for this visit.     Objective:  BP 136/76   Pulse 71   Temp 99.7 F (37.6 C) (Temporal)   Ht 6\' 1"  (1.854 m)   Wt 250 lb (113.4 kg)   SpO2 98%   BMI 32.98 kg/m  Gen: NAD, resting comfortably CV: RRR no murmurs rubs or gallops Lungs: CTAB no crackles, wheeze, rhonchi Ext: trace edema under compression stockings Skin: warm, dry Tremor worse with intention- better at rest     Assessment and Plan   #Hyperlipidemia S: Reasonably controlled on simvastatin 40 mg-half of 80 mg tablet  [facility] also added gemfibrozil with triglycerides 379-currently on fenofibrate Lab Results  Component Value Date   CHOL 117 01/26/2020   HDL 50 01/26/2020   LDLCALC 95 01/26/2020   LDLDIRECT 47.0 11/05/2019   TRIG 203 (A) 01/26/2020   CHOLHDL 2 07/16/2017   A/P: reasonable control- continue current meds- update at next visit.    #Hypertension S: Controlled on hydrochlorothiazide 25 mg, losartan 100 mg -  off amlodipine 5 mg (was on 10mg  05/02/20 but he wanted to stop due to edema)  -propranolol for tremor lowered HR today so he stopped this BP Readings from Last 3 Encounters:  10/31/20 136/76  08/09/20 (!) 147/79  07/25/20 140/80   A/P:  Stable. Continue current medications. Marland Kitchen   #Diabetes type 2 with ophthalmic complications- lifestyle meter S: Compliant with Lantus 65 units (much lower with ozempic) .  Also uses short acting insulin 30 units before meals.  A1c goal 7.5 or less. Losing weight addition of ozempic 0.5mg  weekly-Patient down 14 pounds from last visit-congratulated patient on tremendous work- he states appetite is less on this  Lab Results  Component Value Date   HGBA1C 6.9 09/18/2020   HGBA1C 7.4 07/10/2020   HGBA1C 7.5 (H) 06/07/2020  A/P: reasonable control- continue current medicines.    #Hypothyroidism S: Compliant with levothyroxine 50 mcg Lab Results  Component Value Date   TSH 2.19 05/02/2020  A/P: reasonable control- update next visit    #B12 deficiency S: history of gastric bypass . have recommended b12 1000 mcg per day- he prefers injections once a month here- last 10/24/20 Lab Results  Component Value Date   VITAMINB12 >1500 (H) 11/05/2019     A/P: good control last check- udpate next visit  %  Patient with known CLL-continues to follow with Dr. Alen Blew.  Has been stable-continue to follow Dr. Jenny Reichmann.  #vitamin D slightly low at [place]- taking a pill a day 1000 units he thinks  Just had labs in October with [place]- we opted out today  Recommended follow up:  Future Appointments  Date Time Provider [place]  11/20/2020  7:45 AM  Hayden Pedro, MD [facility] None  11/21/2020  9:15 AM [facility] NURSE [facility]  12/05/2020  9:00 AM [facility] CCM PHARMACIST [facility] PEC  08/08/2021  9:30 AM [facility] LAB [facility] None  08/08/2021 10:00 AM Shadad, Mathis Dad, MD [facility] None  09/11/2021  9:15 AM [name], MD [facility]    Lab/Order associations:   ICD-10-CM   1. Type 2 diabetes mellitus with retinopathy of both eyes, with long-term current use of insulin, macular edema presence unspecified, unspecified retinopathy severity ([place])  E11.319    Z79.4   2. Chronic lymphocytic leukemia ([place])  C91.10   3. Hypothyroidism due to acquired atrophy of thyroid  E03.4   4. Hypertension associated with diabetes ([place])  E11.59    I15.2   5. Hyperlipidemia associated with type 2 diabetes mellitus ([place])  E11.69    E78.5    Return precautions advised.  [name], MD

## 2020-10-31 ENCOUNTER — Ambulatory Visit (INDEPENDENT_AMBULATORY_CARE_PROVIDER_SITE_OTHER): Payer: Medicare Other | Admitting: Family Medicine

## 2020-10-31 ENCOUNTER — Other Ambulatory Visit: Payer: Self-pay

## 2020-10-31 ENCOUNTER — Encounter: Payer: Self-pay | Admitting: Family Medicine

## 2020-10-31 VITALS — BP 136/76 | HR 71 | Temp 99.7°F | Ht 73.0 in | Wt 250.0 lb

## 2020-10-31 DIAGNOSIS — E1169 Type 2 diabetes mellitus with other specified complication: Secondary | ICD-10-CM

## 2020-10-31 DIAGNOSIS — C9110 Chronic lymphocytic leukemia of B-cell type not having achieved remission: Secondary | ICD-10-CM

## 2020-10-31 DIAGNOSIS — E11319 Type 2 diabetes mellitus with unspecified diabetic retinopathy without macular edema: Secondary | ICD-10-CM

## 2020-10-31 DIAGNOSIS — E034 Atrophy of thyroid (acquired): Secondary | ICD-10-CM

## 2020-10-31 DIAGNOSIS — E785 Hyperlipidemia, unspecified: Secondary | ICD-10-CM | POA: Diagnosis not present

## 2020-10-31 DIAGNOSIS — Z794 Long term (current) use of insulin: Secondary | ICD-10-CM

## 2020-10-31 DIAGNOSIS — I152 Hypertension secondary to endocrine disorders: Secondary | ICD-10-CM | POA: Diagnosis not present

## 2020-10-31 DIAGNOSIS — E1159 Type 2 diabetes mellitus with other circulatory complications: Secondary | ICD-10-CM | POA: Diagnosis not present

## 2020-10-31 NOTE — Patient Instructions (Addendum)
Call us with date of your covid booster  Glad you are doing so well! congrats on weight loss  Recommended follow up: Return in about 6 months (around 04/30/2021).

## 2020-11-01 ENCOUNTER — Encounter: Payer: Self-pay | Admitting: Family Medicine

## 2020-11-01 DIAGNOSIS — E559 Vitamin D deficiency, unspecified: Secondary | ICD-10-CM | POA: Insufficient documentation

## 2020-11-01 HISTORY — DX: Vitamin D deficiency, unspecified: E55.9

## 2020-11-02 DIAGNOSIS — N5231 Erectile dysfunction following radical prostatectomy: Secondary | ICD-10-CM | POA: Diagnosis not present

## 2020-11-02 DIAGNOSIS — N393 Stress incontinence (female) (male): Secondary | ICD-10-CM | POA: Diagnosis not present

## 2020-11-20 ENCOUNTER — Encounter (INDEPENDENT_AMBULATORY_CARE_PROVIDER_SITE_OTHER): Payer: Medicare Other | Admitting: Ophthalmology

## 2020-11-20 ENCOUNTER — Other Ambulatory Visit: Payer: Self-pay

## 2020-11-20 DIAGNOSIS — H353231 Exudative age-related macular degeneration, bilateral, with active choroidal neovascularization: Secondary | ICD-10-CM | POA: Diagnosis not present

## 2020-11-20 DIAGNOSIS — H35033 Hypertensive retinopathy, bilateral: Secondary | ICD-10-CM

## 2020-11-20 DIAGNOSIS — I10 Essential (primary) hypertension: Secondary | ICD-10-CM

## 2020-11-20 DIAGNOSIS — H43813 Vitreous degeneration, bilateral: Secondary | ICD-10-CM | POA: Diagnosis not present

## 2020-11-21 ENCOUNTER — Ambulatory Visit (INDEPENDENT_AMBULATORY_CARE_PROVIDER_SITE_OTHER): Payer: Medicare Other

## 2020-11-21 DIAGNOSIS — E538 Deficiency of other specified B group vitamins: Secondary | ICD-10-CM | POA: Diagnosis not present

## 2020-11-21 MED ORDER — CYANOCOBALAMIN 1000 MCG/ML IJ SOLN
1000.0000 ug | Freq: Once | INTRAMUSCULAR | Status: AC
  Administered 2020-11-21: 1000 ug via INTRAMUSCULAR

## 2020-11-21 NOTE — Progress Notes (Signed)
Per orders of Dr. Hunter, injection of B-12 given by [name] in right deltoid. Patient tolerated injection well. Patient will make appointment for 1 month.  

## 2020-11-26 ENCOUNTER — Encounter: Payer: Self-pay | Admitting: Family Medicine

## 2020-11-27 ENCOUNTER — Other Ambulatory Visit: Payer: Self-pay

## 2020-11-27 ENCOUNTER — Encounter: Payer: Self-pay | Admitting: Physician Assistant

## 2020-11-27 ENCOUNTER — Ambulatory Visit (INDEPENDENT_AMBULATORY_CARE_PROVIDER_SITE_OTHER): Payer: Medicare Other | Admitting: Physician Assistant

## 2020-11-27 ENCOUNTER — Encounter: Payer: Self-pay | Admitting: Family Medicine

## 2020-11-27 VITALS — BP 130/76 | HR 74 | Temp 98.7°F | Ht 73.0 in | Wt 246.2 lb

## 2020-11-27 DIAGNOSIS — E034 Atrophy of thyroid (acquired): Secondary | ICD-10-CM

## 2020-11-27 DIAGNOSIS — M542 Cervicalgia: Secondary | ICD-10-CM | POA: Diagnosis not present

## 2020-11-27 NOTE — Patient Instructions (Signed)
It was great to see you!  Let's check your thyroid today. I will be in touch with the results as soon as they have returned.  Take care,  Jarold Motto PA-C

## 2020-11-27 NOTE — Progress Notes (Signed)
[name] is a 79 y.o. male here for a new problem.  I acted as a Education administrator for [employer], PA-C Anselmo Pickler, LPN   History of Present Illness:   Chief Complaint  Patient presents with  . Neck Pain  . Sinus Problem    HPI    Neck pain; Hypothyroidism Pt c/o neck pain off and on, hurts on side of neck. Pt concerned Ozempic is causing neck pain. Has not taken dosage in 2 weeks. He has a history of hypothyroidism, currently on 50 mcg synthroid daily which he endorses compliance with. He would like his thyroid checked. Denies swallowing difficulty, unintentional weight loss. He states that he has some throat pain if he doesn't use a nasal spray prior to using CPAP.  Denies: fever, chills, neck stiffness/ridgidity, rash  Wt Readings from Last 5 Encounters:  11/27/20 246 lb 4 oz (111.7 kg)  10/31/20 250 lb (113.4 kg)  08/09/20 264 lb (119.7 kg)  07/25/20 263 lb 3.2 oz (119.4 kg)  06/07/20 274 lb 6.4 oz (124.5 kg)    Past Medical History:  Diagnosis Date  . Arthritis   . Atypical mole 04/02/1999   (MODERATE) LEFT BACK WAIST LINE DR Tonia Brooms  . Basal cell carcinoma 03/18/2017   LEFT NECK CX3 5FU  . Cataract   . Colon polyps   . DDD (degenerative disc disease), lumbar   . Depression    PTSD  . Diabetes mellitus   . Diabetic retinopathy   . Diverticulosis of colon   . Elevated PSA   . ERECTILE DYSFUNCTION 12/30/2007   No rx.     Marland Kitchen GERD (gastroesophageal reflux disease)   . Hyperlipidemia   . Hypertension   . Hypothyroidism   . IBS (irritable bowel syndrome)   . Leukemia ([place]) 01/2014  . Macular degeneration    followed by ophthalmology  . Nevus, atypical 06/01/2001   (MODERTE) RIGHT BUTTOCKS WIDER SHAVE DR GRUBER  . OSA (obstructive sleep apnea)   . PONV (postoperative nausea and vomiting)   . Prostate cancer ([place])   . PTSD (post-traumatic stress disorder)    [facility]  . Sleep apnea      Social History   Tobacco Use  . Smoking status: Former  Smoker    Packs/day: 1.00    Years: 20.00    Pack years: 20.00    Types: Cigarettes    Quit date: 02/25/1981    Years since quitting: 39.7  . Smokeless tobacco: Never Used  Vaping Use  . Vaping Use: Never used  Substance Use Topics  . Alcohol use: No    Alcohol/week: 0.0 standard drinks    Comment: stopped drinking 76 or 77   . Drug use: No    Past Surgical History:  Procedure Laterality Date  . CHOLECYSTECTOMY    . COLONOSCOPY  03/25/12, 09/28/08  . ESOPHAGOGASTRODUODENOSCOPY N/A 10/12/2014   Procedure: ESOPHAGOGASTRODUODENOSCOPY (EGD);  Surgeon: Gatha Mayer, MD;  Location: [facility];  Service: Endoscopy;  Laterality: N/A;  . ESOPHAGOGASTRODUODENOSCOPY ENDOSCOPY  09/28/08  . FOOT SURGERY Bilateral   . HEMORRHOID SURGERY    . LAPAROSCOPIC GASTRIC BANDING  03/05/11   weight loss  . LAPAROSCOPIC GASTRIC BANDING WITH HIATAL HERNIA REPAIR  03/05/2011  . LYMPHADENECTOMY Bilateral 01/24/2014   Procedure: LYMPHADENECTOMY;  Surgeon: Dutch Gray, MD;  Location: [facility];  Service: Urology;  Laterality: Bilateral;  . ORIF TIBIA FRACTURE Right   . PENILE PROSTHESIS IMPLANT    . PROSTATE SURGERY  01/2014  .  ROBOT ASSISTED LAPAROSCOPIC RADICAL PROSTATECTOMY N/A 01/24/2014   Procedure: ROBOTIC ASSISTED LAPAROSCOPIC RADICAL PROSTATECTOMY LEVEL 3;  Surgeon: Crecencio Mc, MD;  Location: [facility];  Service: Urology;  Laterality: N/A;  . SHOULDER SURGERY Right 2011  . TONSILLECTOMY     age 46  . VASECTOMY      Family History  Problem Relation Age of Onset  . Hypertension Mother   . Stomach cancer Mother 51  . Alcohol abuse Brother   . Hypertension Paternal Uncle   . Heart attack Paternal Uncle   . Colon cancer Neg Hx   . Colon polyps Neg Hx   . Rectal cancer Neg Hx     Allergies  Allergen Reactions  . Morphine Sulfate Itching and Rash    Current Medications:   Current Outpatient Medications:  .  Cholecalciferol 50 MCG (2000 UT) TABS, Take by mouth., Disp: , Rfl:  .  clobetasol  (TEMOVATE) 0.05 % external solution, Apply topically., Disp: , Rfl:  .  fenofibrate 160 MG tablet, Take 160 mg by mouth daily., Disp: , Rfl:  .  glucose blood test strip, Use to test 4 times daily. E11.9, Disp: 100 each, Rfl: 12 .  hydrochlorothiazide (HYDRODIURIL) 25 MG tablet, Take 25 mg by mouth daily., Disp: , Rfl:  .  insulin aspart (NOVOLOG) 100 UNIT/ML injection, Inject 30 Units into the skin 3 (three) times daily with meals. When sugar is elevated, Disp: , Rfl:  .  insulin glargine (LANTUS) 100 UNIT/ML injection, Inject 65 Units into the skin at bedtime., Disp: , Rfl:  .  lansoprazole (PREVACID) 30 MG capsule, Take 1 capsule (30 mg total) by mouth daily at 12 noon., Disp: 30 capsule, Rfl: 5 .  levothyroxine (SYNTHROID, LEVOTHROID) 50 MCG tablet, Take 50 mcg by mouth daily before breakfast., Disp: , Rfl:  .  losartan (COZAAR) 100 MG tablet, Take 100 mg by mouth daily., Disp: , Rfl:  .  meclizine (ANTIVERT) 25 MG tablet, Take 25 mg by mouth every 6 (six) hours., Disp: , Rfl:  .  mirtazapine (REMERON) 15 MG tablet, Take 15 mg by mouth at bedtime., Disp: , Rfl:  .  OVER THE COUNTER MEDICATION, PreserVision for eyes, Disp: , Rfl:  .  potassium chloride SA (K-DUR,KLOR-CON) 20 MEQ tablet, Take 20 mEq by mouth daily., Disp: , Rfl:  .  simvastatin (ZOCOR) 40 MG tablet, Take 40 mg by mouth daily. [facility]- dose unclear but taking 1/2 tablet, Disp: , Rfl:  .  sucralfate (CARAFATE) 1 G tablet, Take 1 g by mouth 4 (four) times daily -  with meals and at bedtime., Disp: , Rfl:  .  traMADol (ULTRAM) 50 MG tablet, Take 50 mg by mouth every 6 (six) hours as needed. for pain, Disp: , Rfl: 0 .  Semaglutide (OZEMPIC, 0.25 OR 0.5 MG/DOSE, [place]), Inject 0.5 mg into the skin once a week. (Patient not taking: Reported on 11/27/2020), Disp: , Rfl:    Review of Systems:   ROS Negative unless otherwise specified per HPI.  Vitals:   Vitals:   11/27/20 1448  BP: 130/76  Pulse: 74  Temp: 98.7 F (37.1 C)   TempSrc: Temporal  SpO2: 96%  Weight: 246 lb 4 oz (111.7 kg)  Height: 6\' 1"  (1.854 m)     Body mass index is 32.49 kg/m.  Physical Exam:   Physical Exam Vitals and nursing note reviewed.  Constitutional:      General: He is not in acute distress.    Appearance: He  is well-developed. He is not ill-appearing, toxic-appearing or sickly-appearing.  HENT:     Head: Normocephalic and atraumatic.     Right Ear: Tympanic membrane, ear canal and external ear normal. Tympanic membrane is not erythematous, retracted or bulging.     Left Ear: Tympanic membrane, ear canal and external ear normal. Tympanic membrane is not erythematous, retracted or bulging.     Nose: Nose normal.     Right Sinus: No maxillary sinus tenderness or frontal sinus tenderness.     Left Sinus: No maxillary sinus tenderness or frontal sinus tenderness.     Mouth/Throat:     Pharynx: Uvula midline. No posterior oropharyngeal edema or posterior oropharyngeal erythema.  Eyes:     General: Lids are normal.     Conjunctiva/sclera: Conjunctivae normal.  Neck:     Trachea: Trachea normal.  Cardiovascular:     Rate and Rhythm: Normal rate and regular rhythm.     Pulses: Normal pulses.     Heart sounds: Normal heart sounds, S1 normal and S2 normal.     Comments: No LE edema Pulmonary:     Effort: Pulmonary effort is normal.     Breath sounds: Normal breath sounds. No decreased breath sounds, wheezing, rhonchi or rales.  Lymphadenopathy:     Cervical: No cervical adenopathy.  Skin:    General: Skin is warm, dry and intact.  Neurological:     Mental Status: He is alert.     GCS: GCS eye subscore is 4. GCS verbal subscore is 5. GCS motor subscore is 6.  Psychiatric:        Mood and Affect: Mood and affect normal.        Speech: Speech normal.        Behavior: Behavior normal. Behavior is cooperative.     Assessment and Plan:   [name] was seen today for neck pain and sinus problem.  Diagnoses and all orders for  this visit:  Hypothyroidism due to acquired atrophy of thyroid Update TSH today per patient request. Will adjust current 50 mcg synthroid as indicated based on lab results. -     TSH  Neck pain Normal ROM, no acute physical exam findings. Patient is in no acute distress during our visit. He declines further work-up, mainly just inquiring about TSH level. Reviewed red flags/worsening precautions.   CMA or LPN served as scribe during this visit. History, Physical, and Plan performed by medical provider. The above documentation has been reviewed and is accurate and complete.  Inda Coke, PA-C

## 2020-11-28 LAB — TSH: TSH: 1.77 u[IU]/mL (ref 0.35–4.50)

## 2020-12-04 NOTE — Progress Notes (Signed)
Chronic Care Management Pharmacy Name: [name]     MRN: [id number]     DOB: 01/14/1941  Chief Complaint/ HPI [name], 80 y.o., male, presents for their initial CCM visit with the clinical pharmacist via telephone due to COVID-19 pandemic.  PCP: David Olp, MD Encounter Diagnoses  Name Primary?  . Type 2 diabetes mellitus with retinopathy of both eyes, with long-term current use of insulin, macular edema presence unspecified, unspecified retinopathy severity ([place]) Yes  . Hypertension associated with diabetes ([place])   . Hyperlipidemia associated with type 2 diabetes mellitus ([place])     Office Visits:  11/27/2020 [name]): c/o neck pain. Concern that ozempic may be contributing, requesting thyroid lab. Labs normal - pt encouraged to restart ozempic. 10/31/2020 (PCP): pt to call with covid booster date, recommend 6 month f/u. Opted out of labs due to recently being done at [place]. Pt preference to receive monthly b12 over oral tx.  Patient Active Problem List   Diagnosis Date Noted  . Vitamin D deficiency 11/01/2020  . Bell's palsy 05/08/2020  . Senile purpura ([place]) 05/02/2020  . 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 Ear pressure, left 04/14/2017  . Temporomandibular joint (TMJ) pain 04/14/2017  . Personal history of skin cancer 04/02/2017  . Pain syndrome, chronic 02/01/2016  . 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 myelopathy or radiculopathy 02/01/2016  . Chronic bilateral low back pain without sciatica 12/12/2015  . Tremor 08/11/2015  . GERD (gastroesophageal reflux disease) 12/08/2014  . Hypothyroidism   . Chronic lymphocytic leukemia ([place]) 05/16/2014  . History of prostate cancer 01/24/2014  . Lapband APL + HH repair 08/12/2013  . Insomnia 09/21/2009  . History of colonic polyps 08/22/2008  . 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[place]) 40/98/1191  . Diabetic retinopathy ([place]) 09/24/2007  . Hyperlipidemia associated with type 2 diabetes  mellitus ([place]) 09/24/2007  . OSA on CPAP 09/24/2007  . Hypertension associated with diabetes ([place]) 09/24/2007   Past Surgical History:  Procedure Laterality Date  . CHOLECYSTECTOMY    . COLONOSCOPY  03/25/12, 09/28/08  . ESOPHAGOGASTRODUODENOSCOPY N/A 10/12/2014   Procedure: ESOPHAGOGASTRODUODENOSCOPY (EGD);  Surgeon: David Mayer, MD;  Location: [facility];  Service: Endoscopy;  Laterality: N/A;  . ESOPHAGOGASTRODUODENOSCOPY ENDOSCOPY  09/28/08  . FOOT SURGERY Bilateral   . HEMORRHOID SURGERY    . LAPAROSCOPIC GASTRIC BANDING  03/05/11   weight loss  . LAPAROSCOPIC GASTRIC BANDING WITH HIATAL HERNIA REPAIR  03/05/2011  . LYMPHADENECTOMY Bilateral 01/24/2014   Procedure: LYMPHADENECTOMY;  Surgeon: David Gray, MD;  Location: [facility];  Service: Urology;  Laterality: Bilateral;  . ORIF TIBIA FRACTURE Right   . PENILE PROSTHESIS IMPLANT    . PROSTATE SURGERY  01/2014  . ROBOT ASSISTED LAPAROSCOPIC RADICAL PROSTATECTOMY N/A 01/24/2014   Procedure: ROBOTIC ASSISTED LAPAROSCOPIC RADICAL PROSTATECTOMY LEVEL 3;  Surgeon: David Gray, MD;  Location: [facility];  Service: Urology;  Laterality: N/A;  . SHOULDER SURGERY Right 2011  . TONSILLECTOMY     age 60  . VASECTOMY     Family History  Problem Relation Age of Onset  . Hypertension Mother   . Stomach cancer Mother 88  . Alcohol abuse Brother   . Hypertension Paternal Uncle   . Heart attack Paternal Uncle   . Colon cancer Neg Hx   . Colon polyps Neg Hx   . Rectal cancer Neg Hx    Allergies  Allergen Reactions  . Morphine Sulfate Itching and Rash   Outpatient Encounter Medications as  of 12/05/2020  Medication Sig Note  . amLODipine (NORVASC) 10 MG tablet Take 10 mg by mouth daily.   . Cholecalciferol 50 MCG (2000 UT) TABS Take by mouth.   . fenofibrate 160 MG tablet Take 160 mg by mouth daily.   . hydrochlorothiazide (HYDRODIURIL) 25 MG tablet Take 25 mg by mouth daily.   . insulin aspart (NOVOLOG) 100 UNIT/ML injection Inject 30 Units into  the skin 3 (three) times daily with meals. When sugar is elevated   . insulin glargine (LANTUS) 100 UNIT/ML injection Inject 65 Units into the skin at bedtime.   Marland Kitchen levothyroxine (SYNTHROID, LEVOTHROID) 50 MCG tablet Take 50 mcg by mouth daily before breakfast.   . losartan (COZAAR) 100 MG tablet Take 100 mg by mouth daily.   . mirtazapine (REMERON) 15 MG tablet Take 15 mg by mouth at bedtime.   Marland Kitchen OVER THE COUNTER MEDICATION PreserVision for eyes   . potassium chloride SA (K-DUR,KLOR-CON) 20 MEQ tablet Take 20 mEq by mouth daily.   . Semaglutide (OZEMPIC, 0.25 OR 0.5 MG/DOSE, [place]) Inject 0.5 mg into the skin once a week. 11/27/2020: Pt stopped medication last week  . simvastatin (ZOCOR) 80 MG tablet Take half tablet (40 mg) by mouth once daily   . [DISCONTINUED] simvastatin (ZOCOR) 40 MG tablet Take 40 mg by mouth daily. [facility]- dose unclear but taking 1/2 tablet   . glucose blood test strip Use to test 4 times daily. E11.9   . lansoprazole (PREVACID) 30 MG capsule Take 1 capsule (30 mg total) by mouth daily at 12 noon. (Patient not taking: Reported on 12/05/2020)   . sucralfate (CARAFATE) 1 G tablet Take 1 g by mouth 4 (four) times daily -  with meals and at bedtime. (Patient not taking: Reported on 12/05/2020)   . traMADol (ULTRAM) 50 MG tablet Take 50 mg by mouth every 6 (six) hours as needed. for pain   . [DISCONTINUED] clobetasol (TEMOVATE) 0.05 % external solution Apply topically.   . [DISCONTINUED] meclizine (ANTIVERT) 25 MG tablet Take 25 mg by mouth every 6 (six) hours.    No facility-administered encounter medications on file as of 12/05/2020.   Patient Care Team    Relationship Specialty Notifications Start End  David Olp, MD PCP - General Family Medicine  01/09/15    Comment: Marquis Lunch, Chrystie Nose, MD Consulting Physician Ophthalmology  06/03/19   Madelin Rear, [facility] Pharmacist Pharmacist  10/17/20    Comment: [phone]   Current Diagnosis/Assessment: Goals Addressed             This Visit's Progress   . PharmD Care Plan       CARE PLAN ENTRY (see longitudinal plan of care for additional care plan information)  Current Barriers:  . Chronic Disease Management support, education, and care coordination needs related to Hypertension, Hyperlipidemia, and Diabetes   Hypertension BP Readings from Last 3 Encounters:  11/27/20 130/76  10/31/20 136/76  08/09/20 (!) 147/79   . Pharmacist Clinical Goal(s): o Over the next 365 days, patient will work with PharmD and providers to maintain BP goal <140/90 . Current regimen:  . Losartan 100 mg once daily  . HCTZ 25 mg once daily . Amlodipine 10 mg once daily . Interventions: o Reviewed home monitoring recommendations o Reviewed diet and exercise - Maintain a healthy weight and exercise regularly, as directed by your health care provider. Eat healthy foods, such as: Lean proteins, complex carbohydrates, fresh fruits and vegetables, low-fat dairy products, healthy  fats. . Patient self care activities - Over the next 365 days, patient will: o Check BP at least once every 1-2 weeks, document, and provide at future appointments o Ensure daily salt intake < 2300 mg/day  Hyperlipidemia Lab Results  Component Value Date/Time   LDLCALC 95 01/26/2020 12:00 AM   LDLDIRECT 47.0 11/05/2019 08:21 AM   . Pharmacist Clinical Goal(s): o Over the next 365 days, patient will work with PharmD and providers to achieve LDL goal < 70 . Current regimen:  o Simvastatin 80 mg tablet - take half tablet (40 mg) once daily o Fenofibrate 160 mh once daily . Interventions: o Reviewed side effects/tolerability - none noted at this time . Patient self care activities - Over the next 365 days, patient will: o Continue current management  Diabetes Lab Results  Component Value Date/Time   HGBA1C 6.9 09/18/2020 12:00 AM   HGBA1C 7.4 07/10/2020 12:00 AM   . Pharmacist Clinical Goal(s): o Over the next 365 days, patient will work  with PharmD and providers to maintain A1c goal <7% . Current regimen:  . Novolog flex pen 30 units three times daily with meals . Lantus 65 units once every night . Ozempic 0.5 mg once weekly  . Interventions: o Discussed signs of low blood sugar. o Reviewed rule of 15 for low BG correction.  . Patient self care activities - Over the next 365 days, patient will: o Check blood sugar once daily, document, and provide at future appointments o Contact provider with any episodes of hypoglycemia  Medication management . Pharmacist Clinical Goal(s): o Over the next 365 days, patient will work with PharmD and providers to maintain optimal medication adherence . Current pharmacy: [facility]  . Interventions o Comprehensive medication review performed. o Continue current medication management strategy. . Patient self care activities - Over the next 365 days, patient will: o Take medications as prescribed o Report any questions or concerns to PharmD and/or provider(s) Initial goal documentation.      Hypertension   BP goal <140/90  BP Readings from Last 3 Encounters:  11/27/20 130/76  10/31/20 136/76  08/09/20 (!) 147/79    BMP Latest Ref Rng & Units 09/18/2020 05/02/2020 01/26/2020  Glucose 70 - 99 mg/dL - 127(H) -  BUN 4 - '21 13 12 10  ' Creatinine 0.6 - 1.3 1.2 0.85 1.0  Sodium 137 - 147 137 139 140  Potassium 3.4 - 5.3 3.7 4.2 4.0  Chloride 99 - 108 103 100 104  CO2 13 - 22 28(A) 30 33(A)  Calcium 8.4 - 10.5 mg/dL - 9.7 9.3   Previous medications: chlorthalidone. Amlodipine previously stopped due to pt concern of edema.   Reports to have started back on amlodipine 10 mg once daily, previously prescribed propanolol for tremor but stopped due to lower HR. Patient checks BP at home several times per month. Recent home readings: n/a not able to provide.  Taking potassium 20 meq once daily. Denies dizziness or swelling/edema at this time. Patient is currently at goal  on the following medications:  . Losartan 100 mg once daily  . HCTZ 25 mg once daily . Amlodipine 10 mg once daily  We discussed diet/exercise - Maintain a healthy weight and exercise regularly, as directed by your health care provider. Eat healthy foods, such as: Lean proteins, complex carbohydrates, fresh fruits and vegetables, low-fat dairy products, healthy fats.  Plan  Continue current medications.  Diabetes   A1c goal < 7%  Lab Results  Component Value Date/Time   HGBA1C 6.9 09/18/2020 12:00 AM   HGBA1C 7.4 07/10/2020 12:00 AM   HGBA1C 7.5 (H) 06/07/2020 09:19 AM   HGBA1C 7.0 01/26/2020 12:00 AM   HGBA1C 6.7 (A) 11/05/2019 08:51 AM   HGBA1C 7.9 06/01/2019 12:00 AM   HGBA1C 7.7 (H) 12/28/2018 08:23 AM   HGBA1C 7.7 (A) 07/24/2018 12:00 AM   HGBA1C 7.0 (A) 06/25/2018 08:16 AM   HGBA1C 7.4 (H) 12/18/2017 11:53 AM   HGBA1C 7.4 (H) 07/16/2017 08:33 AM   HGBA1C 7.3 04/02/2017 08:31 AM   HGBA1C 7.1 (H) 12/24/2016 08:42 AM   HGBA1C 7.5 08/28/2016 12:00 AM   MICROALBUR 2.58 02/27/2016 12:00 AM   MICROALBUR 14.8 (H) 11/16/2013 08:15 AM   MICRALBCREAT 5.4 11/16/2013 08:15 AM   MICRALBCREAT 0.9 05/26/2013 09:08 AM   GFR 87.47 05/02/2020 01:23 PM   GFR 86.41 11/05/2019 08:21 AM   GFR 79.98 08/23/2019 03:08 PM   GFR 81.14 12/28/2018 08:23 AM   GFR 80.22 06/25/2018 08:32 AM    Previous medications: metformin 1000 mg twice daily with meals.  Recent FBG readings: 130 this morning. Reports to be doing well on Ozempic and has restarted therapy since last in office 11/27/2020. Has lost 20 pounds, does not following any specific diet or exercise plan. Denies recent low BG but reports might happen about once per month if skipping a meal, corrects with glucose tablets or orange juice.  Current weight 248.6 lbs. Denies recent low BG. Patient is currently at goal on: . Novolog flex pen 30 units three times daily with meals . Lantus 65 units once every night . Ozempic 0.5 mg once weekly    We discussed diet and exercise - see hypertension. Review rule of 15 for BG correction.  Plan  Continue current medications. 1 month CPA call to collect additional BG readings.  Hyperlipidemia   LDL goal < 70  Lipid Panel     Component Value Date/Time   CHOL 117 01/26/2020 0000   TRIG 203 (A) 01/26/2020 0000   HDL 50 01/26/2020 0000   LDLCALC 95 01/26/2020 0000   LDLDIRECT 47.0 11/05/2019 0821    Hepatic Function Latest Ref Rng & Units 05/02/2020 01/26/2020 11/05/2019  Total Protein 6.0 - 8.3 g/dL 6.5 - 6.2  Albumin 3.5 - 5.2 g/dL 4.6 3.8 4.3  AST 0 - 37 U/L 30 44(A) 22  ALT 0 - 53 U/L 27 40 23  Alk Phosphatase 39 - 117 U/L 58 - 51  Total Bilirubin 0.2 - 1.2 mg/dL 1.4(H) - 1.5(H)  Bilirubin, Direct 0.01 - 0.4 - 0.3 -   Previous medications: atorvastatin 40 mg.   Denies any side effects or tolerability issues. Patient is currently not at goal the following medications:  . Simvastatin 80 mg - half tablet (40 mg) daily [[facility]] . Fenofibrate 160 mg once daily [[facility]]  Reviewed side effects - none noted  Plan  Continue current medications.  Hypothyroidism   Lab Results  Component Value Date/Time   TSH 1.77 11/27/2020 03:32 PM   TSH 2.19 05/02/2020 01:23 PM   FREET4 0.84 05/23/2010 08:23 AM   TSH is currently within range on the following medications:  . Levothyroxine 50 mcg once daily  We discussed: administration technique - labs within normal range. continue to take consistently at the same time daily.  Plan  Continue current medications.  GERD   Lab Results  Component Value Date   VITAMINB12 >1500 (H) 11/05/2019   Patient denies recent acid reflux. Denies any  medications used to mange acid reflux including pantoprazole 40 mg once daily, lansoprazole 40 mg once daily, sucralfate 1 gram four times daily  Plan   Continue current management Recommend avoidance of potential triggers such as alcohol, fatty foods, lying down after eating, and tomato  sauce.  Insomnia    Denies side effects, reports ongoing benefit of taking medication. Patient is currently controlled on the following medications:  Marland Kitchen Mirtazipine 15 mg once daily  Plan  Continue current medications.  Vaccines   Immunization History  Administered Date(s) Administered  . Fluad Quad(high Dose 65+) 07/29/2019, 08/08/2020  . Hep A / Hep B 07/29/2011, 01/27/2012  . Hepatitis B 08/26/2011  . Influenza Split 09/18/2012  . Influenza Whole 09/22/2007, 09/01/2009, 08/31/2010  . Influenza, High Dose Seasonal PF 08/13/2017, 07/30/2018  . Influenza,inj,Quad PF,6+ Mos 08/11/2013, 08/31/2014, 08/11/2015  . Influenza-Unspecified 08/20/2016  . PFIZER SARS-COV-2 Vaccination 12/13/2019, 01/06/2020, 07/26/2020  . Pneumococcal Conjugate-13 09/06/2014  . Pneumococcal Polysaccharide-23 12/02/2008  . Td 05/07/2007  . Tdap 08/31/2014  . Zoster 12/03/2007   Reviewed and discussed patient's vaccination history.  Reported to have received shingrix - dates not provided.  Plan  No recommendations at this time.   Medication Management / Care Coordination   Receives prescription medications from:  [facility] [address] [place] [address] Phone: [phone] Fax: [phone]   [facility] denies any issues at this time.   Plan  Continue current medication management strategy. ___________________________ SDOH (Social Determinants of Health) assessments performed: Yes.  Future Appointments  Date Time Provider [place]  12/20/2020  9:15 AM [facility] NURSE [facility]  01/01/2021  7:30 AM Hayden Pedro, MD [facility] None  05/04/2021  1:20 PM David Olp, MD [facility]  08/08/2021  9:30 AM [facility] LAB [facility] None  08/08/2021 10:00 AM Wyatt Portela, MD [facility] None  09/11/2021  9:15 AM [name], MD [facility]  12/05/2021  1:30 PM [facility] CCM PHARMACIST [facility] P[facility]    Visit follow-up:  . CPA follow-up: 1 month DM call to collect additional BG readings, pt did not have meter/log with him at time of visit. Reports to be testing multiple times throughout the day in addition to first thing in morning. Marland Kitchen RPH follow-up: 12 month call per pt request.  Madelin Rear, Pharm.D., BCGP Clinical Pharmacist [facility] [phone]

## 2020-12-05 ENCOUNTER — Other Ambulatory Visit: Payer: Self-pay

## 2020-12-05 ENCOUNTER — Ambulatory Visit: Payer: Medicare Other

## 2020-12-05 DIAGNOSIS — E11319 Type 2 diabetes mellitus with unspecified diabetic retinopathy without macular edema: Secondary | ICD-10-CM

## 2020-12-05 DIAGNOSIS — E1169 Type 2 diabetes mellitus with other specified complication: Secondary | ICD-10-CM

## 2020-12-05 DIAGNOSIS — E785 Hyperlipidemia, unspecified: Secondary | ICD-10-CM

## 2020-12-05 DIAGNOSIS — E1159 Type 2 diabetes mellitus with other circulatory complications: Secondary | ICD-10-CM

## 2020-12-05 NOTE — Patient Instructions (Signed)
Mr. Nahar,  Thank you for taking the time to review your medications with me today.  I have included our care plan/goals in the following pages. Please review and call me at [phone] with any questions!  Thanks! Ellin Mayhew, Pharm.D., BCGP Clinical Pharmacist [facility] [phone]  Goals Addressed            This Visit's Progress   . PharmD Care Plan       CARE PLAN ENTRY (see longitudinal plan of care for additional care plan information)  Current Barriers:  . Chronic Disease Management support, education, and care coordination needs related to Hypertension, Hyperlipidemia, and Diabetes   Hypertension BP Readings from Last 3 Encounters:  11/27/20 130/76  10/31/20 136/76  08/09/20 (!) 147/79   . Pharmacist Clinical Goal(s): o Over the next 365 days, patient will work with PharmD and providers to maintain BP goal <140/90 . Current regimen:  . Losartan 100 mg once daily  . HCTZ 25 mg once daily . Amlodipine 10 mg once daily . Interventions: o Reviewed home monitoring recommendations o Reviewed diet and exercise - Maintain a healthy weight and exercise regularly, as directed by your health care provider. Eat healthy foods, such as: Lean proteins, complex carbohydrates, fresh fruits and vegetables, low-fat dairy products, healthy fats. . Patient self care activities - Over the next 365 days, patient will: o Check BP at least once every 1-2 weeks, document, and provide at future appointments o Ensure daily salt intake < 2300 mg/day  Hyperlipidemia Lab Results  Component Value Date/Time   LDLCALC 95 01/26/2020 12:00 AM   LDLDIRECT 47.0 11/05/2019 08:21 AM   . Pharmacist Clinical Goal(s): o Over the next 365 days, patient will work with PharmD and providers to achieve LDL goal < 70 . Current regimen:  o Simvastatin 80 mg tablet - take half tablet (40 mg) once daily . Interventions: o Reviewed side effects/tolerability -  none noted at this time . Patient self care activities - Over the next 365 days, patient will: o Continue current management  Diabetes Lab Results  Component Value Date/Time   HGBA1C 6.9 09/18/2020 12:00 AM   HGBA1C 7.4 07/10/2020 12:00 AM   . Pharmacist Clinical Goal(s): o Over the next 365 days, patient will work with PharmD and providers to maintain A1c goal <7% . Current regimen:  . Novolog flex pen 30 units three times daily with meals . Lantus 65 units once every night . Ozempic 0.5 mg once weekly  . Interventions: o Discussed signs of low blood sugar. o Reviewed rule of 15 for low BG correction.  . Patient self care activities - Over the next 365 days, patient will: o Check blood sugar once daily, document, and provide at future appointments o Contact provider with any episodes of hypoglycemia  Medication management . Pharmacist Clinical Goal(s): o Over the next 365 days, patient will work with PharmD and providers to maintain optimal medication adherence . Current pharmacy: [facility]  . Interventions o Comprehensive medication review performed. o Continue current medication management strategy. . Patient self care activities - Over the next 365 days, patient will: o Take medications as prescribed o Report any questions or concerns to PharmD and/or provider(s) Initial goal documentation.      The patient verbalized understanding of instructions provided today and agreed to receive a mailed copy of patient instruction and/or educational materials. Telephone follow up appointment with pharmacy team member  scheduled for: See next appointment with "Care Management Staff" under "What's Next" below.   Hypertension, Adult High blood pressure (hypertension) is when the force of blood pumping through the arteries is too strong. The arteries are the blood vessels that carry blood from the heart throughout the body. Hypertension forces the heart to work harder to  pump blood and may cause arteries to become narrow or stiff. Untreated or uncontrolled hypertension can cause a heart attack, heart failure, a stroke, kidney disease, and other problems. A blood pressure reading consists of a higher number over a lower number. Ideally, your blood pressure should be below 120/80. The first ("top") number is called the systolic pressure. It is a measure of the pressure in your arteries as your heart beats. The second ("bottom") number is called the diastolic pressure. It is a measure of the pressure in your arteries as the heart relaxes. What are the causes? The exact cause of this condition is not known. There are some conditions that result in or are related to high blood pressure. What increases the risk? Some risk factors for high blood pressure are under your control. The following factors may make you more likely to develop this condition:  Smoking.  Having type 2 diabetes mellitus, high cholesterol, or both.  Not getting enough exercise or physical activity.  Being overweight.  Having too much fat, sugar, calories, or salt (sodium) in your diet.  Drinking too much alcohol. Some risk factors for high blood pressure may be difficult or impossible to change. Some of these factors include:  Having chronic kidney disease.  Having a family history of high blood pressure.  Age. Risk increases with age.  Race. You may be at higher risk if you are African American.  Gender. Men are at higher risk than women before age 45. After age 65, women are at higher risk than men.  Having obstructive sleep apnea.  Stress. What are the signs or symptoms? High blood pressure may not cause symptoms. Very high blood pressure (hypertensive crisis) may cause:  Headache.  Anxiety.  Shortness of breath.  Nosebleed.  Nausea and vomiting.  Vision changes.  Severe chest pain.  Seizures. How is this diagnosed? This condition is diagnosed by measuring your  blood pressure while you are seated, with your arm resting on a flat surface, your legs uncrossed, and your feet flat on the floor. The cuff of the blood pressure monitor will be placed directly against the skin of your upper arm at the level of your heart. It should be measured at least twice using the same arm. Certain conditions can cause a difference in blood pressure between your right and left arms. Certain factors can cause blood pressure readings to be lower or higher than normal for a short period of time:  When your blood pressure is higher when you are in a health care provider's office than when you are at home, this is called white coat hypertension. Most people with this condition do not need medicines.  When your blood pressure is higher at home than when you are in a health care provider's office, this is called masked hypertension. Most people with this condition may need medicines to control blood pressure. If you have a high blood pressure reading during one visit or you have normal blood pressure with other risk factors, you may be asked to:  Return on a different day to have your blood pressure checked again.  Monitor your blood pressure at home for  1 week or longer. If you are diagnosed with hypertension, you may have other blood or imaging tests to help your health care provider understand your overall risk for other conditions. How is this treated? This condition is treated by making healthy lifestyle changes, such as eating healthy foods, exercising more, and reducing your alcohol intake. Your health care provider may prescribe medicine if lifestyle changes are not enough to get your blood pressure under control, and if:  Your systolic blood pressure is above 130.  Your diastolic blood pressure is above 80. Your personal target blood pressure may vary depending on your medical conditions, your age, and other factors. Follow these instructions at home: Eating and  drinking   Eat a diet that is high in fiber and potassium, and low in sodium, added sugar, and fat. An example eating plan is called the DASH (Dietary Approaches to Stop Hypertension) diet. To eat this way: ? Eat plenty of fresh fruits and vegetables. Try to fill one half of your plate at each meal with fruits and vegetables. ? Eat whole grains, such as whole-wheat pasta, brown rice, or whole-grain bread. Fill about one fourth of your plate with whole grains. ? Eat or drink low-fat dairy products, such as skim milk or low-fat yogurt. ? Avoid fatty cuts of meat, processed or cured meats, and poultry with skin. Fill about one fourth of your plate with lean proteins, such as fish, chicken without skin, beans, eggs, or tofu. ? Avoid pre-made and processed foods. These tend to be higher in sodium, added sugar, and fat.  Reduce your daily sodium intake. Most people with hypertension should eat less than 1,500 mg of sodium a day.  Do not drink alcohol if: ? Your health care provider tells you not to drink. ? You are pregnant, may be pregnant, or are planning to become pregnant.  If you drink alcohol: ? Limit how much you use to:  0-1 drink a day for women.  0-2 drinks a day for men. ? Be aware of how much alcohol is in your drink. In the [place] one drink equals one 12 oz bottle of beer (355 mL), one 5 oz glass of wine (148 mL), or one 1 oz glass of hard liquor (44 mL). Lifestyle   Work with your health care provider to maintain a healthy body weight or to lose weight. Ask what an ideal weight is for you.  Get at least 30 minutes of exercise most days of the week. Activities may include walking, swimming, or biking.  Include exercise to strengthen your muscles (resistance exercise), such as Pilates or lifting weights, as part of your weekly exercise routine. Try to do these types of exercises for 30 minutes at least 3 days a week.  Do not use any products that contain nicotine or tobacco,  such as cigarettes, e-cigarettes, and chewing tobacco. If you need help quitting, ask your health care provider.  Monitor your blood pressure at home as told by your health care provider.  Keep all follow-up visits as told by your health care provider. This is important. Medicines  Take over-the-counter and prescription medicines only as told by your health care provider. Follow directions carefully. Blood pressure medicines must be taken as prescribed.  Do not skip doses of blood pressure medicine. Doing this puts you at risk for problems and can make the medicine less effective.  Ask your health care provider about side effects or reactions to medicines that you should watch for. Contact a  health care provider if you:  Think you are having a reaction to a medicine you are taking.  Have headaches that keep coming back (recurring).  Feel dizzy.  Have swelling in your ankles.  Have trouble with your vision. Get help right away if you:  Develop a severe headache or confusion.  Have unusual weakness or numbness.  Feel faint.  Have severe pain in your chest or abdomen.  Vomit repeatedly.  Have trouble breathing. Summary  Hypertension is when the force of blood pumping through your arteries is too strong. If this condition is not controlled, it may put you at risk for serious complications.  Your personal target blood pressure may vary depending on your medical conditions, your age, and other factors. For most people, a normal blood pressure is less than 120/80.  Hypertension is treated with lifestyle changes, medicines, or a combination of both. Lifestyle changes include losing weight, eating a healthy, low-sodium diet, exercising more, and limiting alcohol. This information is not intended to replace advice given to you by your health care provider. Make sure you discuss any questions you have with your health care provider. Document Revised: 07/29/2018 Document Reviewed:  07/29/2018 Elsevier Patient Education  [place].   Diabetes Mellitus and Nutrition, Adult When you have diabetes (diabetes mellitus), it is very important to have healthy eating habits because your blood sugar (glucose) levels are greatly affected by what you eat and drink. Eating healthy foods in the appropriate amounts, at about the same times every day, can help you:  Control your blood glucose.  Lower your risk of heart disease.  Improve your blood pressure.  Reach or maintain a healthy weight. Every person with diabetes is different, and each person has different needs for a meal plan. Your health care provider may recommend that you work with a diet and nutrition specialist (dietitian) to make a meal plan that is best for you. Your meal plan may vary depending on factors such as:  The calories you need.  The medicines you take.  Your weight.  Your blood glucose, blood pressure, and cholesterol levels.  Your activity level.  Other health conditions you have, such as heart or kidney disease. How do carbohydrates affect me? Carbohydrates, also called carbs, affect your blood glucose level more than any other type of food. Eating carbs naturally raises the amount of glucose in your blood. Carb counting is a method for keeping track of how many carbs you eat. Counting carbs is important to keep your blood glucose at a healthy level, especially if you use insulin or take certain oral diabetes medicines. It is important to know how many carbs you can safely have in each meal. This is different for every person. Your dietitian can help you calculate how many carbs you should have at each meal and for each snack. Foods that contain carbs include:  Bread, cereal, rice, pasta, and crackers.  Potatoes and corn.  Peas, beans, and lentils.  Milk and yogurt.  Fruit and juice.  Desserts, such as cakes, cookies, ice cream, and candy. How does alcohol affect me? Alcohol can  cause a sudden decrease in blood glucose (hypoglycemia), especially if you use insulin or take certain oral diabetes medicines. Hypoglycemia can be a life-threatening condition. Symptoms of hypoglycemia (sleepiness, dizziness, and confusion) are similar to symptoms of having too much alcohol. If your health care provider says that alcohol is safe for you, follow these guidelines:  Limit alcohol intake to no more than 1  drink per day for nonpregnant women and 2 drinks per day for men. One drink equals 12 oz of beer, 5 oz of wine, or 1 oz of hard liquor.  Do not drink on an empty stomach.  Keep yourself hydrated with water, diet soda, or unsweetened iced tea.  Keep in mind that regular soda, juice, and other mixers may contain a lot of sugar and must be counted as carbs. What are tips for following this plan?  Reading food labels  Start by checking the serving size on the "Nutrition Facts" label of packaged foods and drinks. The amount of calories, carbs, fats, and other nutrients listed on the label is based on one serving of the item. Many items contain more than one serving per package.  Check the total grams (g) of carbs in one serving. You can calculate the number of servings of carbs in one serving by dividing the total carbs by 15. For example, if a food has 30 g of total carbs, it would be equal to 2 servings of carbs.  Check the number of grams (g) of saturated and trans fats in one serving. Choose foods that have low or no amount of these fats.  Check the number of milligrams (mg) of salt (sodium) in one serving. Most people should limit total sodium intake to less than 2,300 mg per day.  Always check the nutrition information of foods labeled as "low-fat" or "nonfat". These foods may be higher in added sugar or refined carbs and should be avoided.  Talk to your dietitian to identify your daily goals for nutrients listed on the label. Shopping  Avoid buying canned, premade, or  processed foods. These foods tend to be high in fat, sodium, and added sugar.  Shop around the outside edge of the grocery store. This includes fresh fruits and vegetables, bulk grains, fresh meats, and fresh dairy. Cooking  Use low-heat cooking methods, such as baking, instead of high-heat cooking methods like deep frying.  Cook using healthy oils, such as olive, canola, or sunflower oil.  Avoid cooking with butter, cream, or high-fat meats. Meal planning  Eat meals and snacks regularly, preferably at the same times every day. Avoid going long periods of time without eating.  Eat foods high in fiber, such as fresh fruits, vegetables, beans, and whole grains. Talk to your dietitian about how many servings of carbs you can eat at each meal.  Eat 4-6 ounces (oz) of lean protein each day, such as lean meat, chicken, fish, eggs, or tofu. One oz of lean protein is equal to: ? 1 oz of meat, chicken, or fish. ? 1 egg. ?  cup of tofu.  Eat some foods each day that contain healthy fats, such as avocado, nuts, seeds, and fish. Lifestyle  Check your blood glucose regularly.  Exercise regularly as told by your health care provider. This may include: ? 150 minutes of moderate-intensity or vigorous-intensity exercise each week. This could be brisk walking, biking, or water aerobics. ? Stretching and doing strength exercises, such as yoga or weightlifting, at least 2 times a week.  Take medicines as told by your health care provider.  Do not use any products that contain nicotine or tobacco, such as cigarettes and e-cigarettes. If you need help quitting, ask your health care provider.  Work with a Social worker or diabetes educator to identify strategies to manage stress and any emotional and social challenges. Questions to ask a health care provider  Do I need to meet  with a diabetes educator?  Do I need to meet with a dietitian?  What number can I call if I have questions?  When are the  best times to check my blood glucose? Where to find more information:  American Diabetes Association: diabetes.org  Academy of Nutrition and Dietetics: www.eatright.[employer] of Diabetes and Digestive and Kidney Diseases (NIH): [employer] Summary  A healthy meal plan will help you control your blood glucose and maintain a healthy lifestyle.  Working with a diet and nutrition specialist (dietitian) can help you make a meal plan that is best for you.  Keep in mind that carbohydrates (carbs) and alcohol have immediate effects on your blood glucose levels. It is important to count carbs and to use alcohol carefully. This information is not intended to replace advice given to you by your health care provider. Make sure you discuss any questions you have with your health care provider. Document Revised: 10/31/2017 Document Reviewed: 12/23/2016 Elsevier Patient Education  2020 [employer].

## 2020-12-12 ENCOUNTER — Ambulatory Visit (INDEPENDENT_AMBULATORY_CARE_PROVIDER_SITE_OTHER): Payer: Medicare Other | Admitting: Family Medicine

## 2020-12-12 ENCOUNTER — Other Ambulatory Visit: Payer: Self-pay

## 2020-12-12 ENCOUNTER — Ambulatory Visit: Payer: Medicare Other | Admitting: Family Medicine

## 2020-12-12 ENCOUNTER — Telehealth: Payer: Self-pay

## 2020-12-12 ENCOUNTER — Encounter: Payer: Self-pay | Admitting: Family Medicine

## 2020-12-12 VITALS — BP 130/76 | HR 97 | Temp 98.4°F | Resp 18 | Ht 73.0 in | Wt 242.8 lb

## 2020-12-12 DIAGNOSIS — R0789 Other chest pain: Secondary | ICD-10-CM | POA: Diagnosis not present

## 2020-12-12 DIAGNOSIS — E538 Deficiency of other specified B group vitamins: Secondary | ICD-10-CM | POA: Diagnosis not present

## 2020-12-12 DIAGNOSIS — J3489 Other specified disorders of nose and nasal sinuses: Secondary | ICD-10-CM | POA: Diagnosis not present

## 2020-12-12 MED ORDER — CYANOCOBALAMIN 1000 MCG/ML IJ SOLN
1000.0000 ug | Freq: Once | INTRAMUSCULAR | Status: AC
Start: 2020-12-12 — End: 2020-12-12
  Administered 2020-12-12: 1000 ug via INTRAMUSCULAR

## 2020-12-12 MED ORDER — AZELASTINE HCL 0.1 % NA SOLN: 1.0000 | Freq: Two times a day (BID) | NASAL | 12 refills | Status: DC

## 2020-12-12 NOTE — Addendum Note (Signed)
Addended by: Linton Ham on: 12/12/2020 04:45 PM   Modules accepted: Orders

## 2020-12-12 NOTE — Progress Notes (Signed)
Phone [phone] In person visit   Subjective:   [name] is a 80 y.o. year old very pleasant male patient who presents for/with See problem oriented charting Chief Complaint  Patient presents with  . Chest Discomfort   This visit occurred during the SARS-CoV-2 public health emergency.  Safety protocols were in place, including screening questions prior to the visit, additional usage of staff PPE, and extensive cleaning of exam room while observing appropriate contact time as indicated for disinfecting solutions.   Past Medical History-  Patient Active Problem List   Diagnosis Date Noted  . Chronic bilateral low back pain without sciatica 12/12/2015    Priority: High  . Chronic lymphocytic leukemia ([place]) 05/16/2014    Priority: High  . History of prostate cancer 01/24/2014    Priority: High  . 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 Diabetic retinopathy ([place]) 09/24/2007    Priority: High  . Tremor 08/11/2015    Priority: Medium  . Hypothyroidism     Priority: Medium  . 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 Insomnia 09/21/2009    Priority: Medium  . Hyperlipidemia associated with type 2 diabetes mellitus ([place]) 09/24/2007    Priority: Medium  . OSA on CPAP 09/24/2007    Priority: Medium  . Hypertension associated with diabetes ([place]) 09/24/2007    Priority: Medium  . Personal history of skin cancer 04/02/2017    Priority: Low  . GERD (gastroesophageal reflux disease) 12/08/2014    Priority: Low  . History of colonic polyps 08/22/2008    Priority: Low  . Vitamin D deficiency 11/01/2020  . Bell's palsy 05/08/2020  . Senile purpura ([place]) 05/02/2020  . 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 Ear pressure, left 04/14/2017  . Temporomandibular joint (TMJ) pain 04/14/2017  . Pain syndrome, chronic 02/01/2016  . 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 myelopathy or radiculopathy 02/01/2016     Medications- reviewed and updated Current Outpatient Medications  Medication Sig Dispense Refill  . amLODipine (NORVASC) 10 MG tablet Take 10 mg by mouth daily.    Marland Kitchen azelastine (ASTELIN) 0.1 % nasal spray Place 1 spray into both nostrils 2 (two) times daily. Use in each nostril as directed 30 mL 12  . Cholecalciferol 50 MCG (2000 UT) TABS Take by mouth.    . fenofibrate 160 MG tablet Take 160 mg by mouth daily.    Marland Kitchen glucose blood test strip Use to test 4 times daily. E11.9 100 each 12  . hydrochlorothiazide (HYDRODIURIL) 25 MG tablet Take 25 mg by mouth daily.    . insulin aspart (NOVOLOG) 100 UNIT/ML injection Inject 30 Units into the skin 3 (three) times daily with meals. When sugar is elevated    . insulin glargine (LANTUS) 100 UNIT/ML injection Inject 65 Units into the skin at bedtime.    Marland Kitchen levothyroxine (SYNTHROID, LEVOTHROID) 50 MCG tablet Take 50 mcg by mouth daily before breakfast.    . losartan (COZAAR) 100 MG tablet Take 100 mg by mouth daily.    . mirtazapine (REMERON) 15 MG tablet Take 15 mg by mouth at bedtime.    Marland Kitchen OVER THE COUNTER MEDICATION PreserVision for eyes    . potassium chloride SA (K-DUR,KLOR-CON) 20 MEQ tablet Take 20 mEq by mouth daily.    . Semaglutide (OZEMPIC, 0.25 OR 0.5 MG/DOSE, [place]) Inject 0.5 mg into the skin once a week.    . simvastatin (ZOCOR) 80 MG tablet  Take half tablet (40 mg) by mouth once daily    . sucralfate (CARAFATE) 1 G tablet Take 1 g by mouth 4 (four) times daily -  with meals and at bedtime.    . traMADol (ULTRAM) 50 MG tablet Take 50 mg by mouth every 6 (six) hours as needed. for pain  0  . lansoprazole (PREVACID) 30 MG capsule Take 1 capsule (30 mg total) by mouth daily at 12 noon. (Patient not taking: No sig reported) 30 capsule 5   No current facility-administered medications for this visit.     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 He stated that it does feel a little better. He has concerns for inflammation.   A/P: this was more of a nipple sensitivity and thankfully has resolved- normal exam today- we opted to continue to monitor. EKG reassuring done due to concern for chest discomfort but once again this ended up being more nipple discomfort that has resolved.    # Sinus drainage S:ongoing issues with sinus drainage.  Notes from august show 4 months of issues at that time despite flonase and claritin D (have advised against claritin D but he opts to continue despite risks to blood pressure). Augmentin back in august did not help at all- I was concerned about possible chronic sinusitis- we did a 10 day treatment (helped ear issues he reports and may have had sinus infection). Will produce sore throat at times. Sometimes able to cough it up- and gets some short term relief. On losartan- but issue is not really just cough- more ongoing sinus congestion.   Wife also with sinus issues- they both use cpap and wonder if that contributes- they do clean equipment once a week.    Cleans nose out before bed with neil med sinus rinse sounds like.  A/P: chronic sinus drainage now at least since April- did not improve with antibiotic augmentin (treated in case sinus infection). Already on claritin- I encouraged him to stop claritin D and try an alternate like xyzal or allegra before bed  (can be generic) and I am going to add astelin/azelastine nasal spray twice a day to his flonase . If this is expensive or does not improve his symptoms within 3 weeks- I asked him to let me know and I will refer to ear, nose, throat doctors (ent)  Recommended follow up: keep June visit Future Appointments  Date Time Provider [place]  12/20/2020  9:15 AM [facility] NURSE [facility]  01/01/2021  7:30 AM Hayden Pedro, MD [facility] None  05/04/2021  1:20 PM [name], MD [facility] [place]  08/08/2021  9:30 AM [facility] LAB [facility] None  08/08/2021 10:00 AM Wyatt Portela, MD [facility]  None  09/11/2021  9:15 AM [name], MD [facility]  12/05/2021  1:30 PM [facility]CM PHARMACIST [facility]   Lab/Order associations:   ICD-10-CM   1. Chest discomfort  R07.89 EKG 12-Lead  2. Sinus drainage  J34.89    Meds ordered this encounter  Medications  . azelastine (ASTELIN) 0.1 % nasal spray    Sig: Place 1 spray into both nostrils 2 (two) times daily. Use in each nostril as directed    Dispense:  30 mL    Refill:  12   Return precautions advised.  [name], MD

## 2020-12-12 NOTE — Telephone Encounter (Signed)
Please advise since he declined triage if he has worsening symptoms to seek care in the emergency room immediately or call 911

## 2020-12-12 NOTE — Telephone Encounter (Signed)
Patient has a appot today at 4 pm

## 2020-12-12 NOTE — Telephone Encounter (Signed)
Initial Comment Caller states he is experiencing right chest pain. Translation No Nurse Assessment Nurse: Hardin Negus, RN, Mardene Celeste Date/Time Eilene Ghazi Time): 12/12/2020 8:37:24 AM Confirm and document reason for call. If symptomatic, describe symptoms. ---He is experiencing right chest soreness. no other s&s, been going on for a week, the s&s are the same Does the patient have any new or worsening symptoms? ---Yes Will a triage be completed? ---Yes Related visit to physician within the last 2 weeks? ---No Does the PT have any chronic conditions? (i.e. diabetes, asthma, this includes High risk factors for pregnancy, etc.) ---No Is this a behavioral health or substance abuse call? ---No Disp. Time Eilene Ghazi Time) Disposition Final User 12/12/2020 8:36:13 AM Send to Urgent Queue Memory Argue 12/12/2020 8:44:15 AM Clinical Call Yes Hardin Negus, RN, Mardene Celeste Comments User: Ledora Bottcher, RN Date/Time Eilene Ghazi Time): 12/12/2020 8:44:06 AM Pt states " i am not having chest pain, I am having chest soreness." declines triage. Called the office, and connected him to the appointment lin

## 2020-12-12 NOTE — Patient Instructions (Addendum)
Diabetes Health Maintenance Due  Topic Date Due  . OPHTHALMOLOGY EXAM - [facility] have [facility] [place] [facility] of [facility] or your eye doctor outside of [place]- we need updated records 01/02/2021   chronic sinus drainage now at least since April- did not improve with antibiotic augmentin (treated in case sinus infection). Already on claritin- I encouraged him to stop claritin D and try an alternate like xyzal or allegra before bed (can be generic) and I am going to add astelin/azelastine nasal spray twice a day to his flonase . If this is expensive or does not improve his symptoms within 3 weeks- I asked him to let me know and I will refer to ear, nose, throat doctors (ent)  b12 injection today- can schedule next visit in 5 weeks before you leave (spacing out some since we are doing about a week early today)  Let us know if nipple issues return.   Cancel nurse visit on 12/20/20  See you at next visit already scheduled or sooner if needed

## 2020-12-18 ENCOUNTER — Encounter: Payer: Self-pay | Admitting: Family Medicine

## 2020-12-18 ENCOUNTER — Other Ambulatory Visit: Payer: Self-pay

## 2020-12-18 DIAGNOSIS — R109 Unspecified abdominal pain: Secondary | ICD-10-CM

## 2020-12-18 DIAGNOSIS — N644 Mastodynia: Secondary | ICD-10-CM

## 2020-12-20 ENCOUNTER — Ambulatory Visit: Payer: Medicare Other

## 2020-12-21 ENCOUNTER — Other Ambulatory Visit: Payer: Self-pay | Admitting: Family Medicine

## 2020-12-21 DIAGNOSIS — N644 Mastodynia: Secondary | ICD-10-CM

## 2020-12-23 ENCOUNTER — Emergency Department (HOSPITAL_COMMUNITY): Payer: Medicare Other

## 2020-12-23 ENCOUNTER — Encounter: Payer: Self-pay | Admitting: Family Medicine

## 2020-12-23 ENCOUNTER — Encounter: Payer: Self-pay | Admitting: Oncology

## 2020-12-23 ENCOUNTER — Other Ambulatory Visit: Payer: Self-pay

## 2020-12-23 ENCOUNTER — Encounter (HOSPITAL_COMMUNITY): Payer: Self-pay

## 2020-12-23 ENCOUNTER — Emergency Department (HOSPITAL_COMMUNITY)
Admission: EM | Admit: 2020-12-23 | Discharge: 2020-12-23 | Disposition: A | Discharge status: Home / Self Care | Payer: Medicare Other | Attending: Emergency Medicine | Admitting: Emergency Medicine

## 2020-12-23 DIAGNOSIS — R200 Anesthesia of skin: Secondary | ICD-10-CM | POA: Diagnosis not present

## 2020-12-23 DIAGNOSIS — Z79899 Other long term (current) drug therapy: Secondary | ICD-10-CM | POA: Insufficient documentation

## 2020-12-23 DIAGNOSIS — R10811 Right upper quadrant abdominal tenderness: Secondary | ICD-10-CM | POA: Diagnosis not present

## 2020-12-23 DIAGNOSIS — M545 Low back pain, unspecified: Secondary | ICD-10-CM | POA: Insufficient documentation

## 2020-12-23 DIAGNOSIS — C61 Malignant neoplasm of prostate: Secondary | ICD-10-CM | POA: Diagnosis not present

## 2020-12-23 DIAGNOSIS — Z794 Long term (current) use of insulin: Secondary | ICD-10-CM | POA: Insufficient documentation

## 2020-12-23 DIAGNOSIS — Z85828 Personal history of other malignant neoplasm of skin: Secondary | ICD-10-CM | POA: Diagnosis not present

## 2020-12-23 DIAGNOSIS — Z8546 Personal history of malignant neoplasm of prostate: Secondary | ICD-10-CM | POA: Diagnosis not present

## 2020-12-23 DIAGNOSIS — E039 Hypothyroidism, unspecified: Secondary | ICD-10-CM | POA: Diagnosis not present

## 2020-12-23 DIAGNOSIS — R0789 Other chest pain: Secondary | ICD-10-CM | POA: Diagnosis not present

## 2020-12-23 DIAGNOSIS — Z87891 Personal history of nicotine dependence: Secondary | ICD-10-CM | POA: Insufficient documentation

## 2020-12-23 DIAGNOSIS — K219 Gastro-esophageal reflux disease without esophagitis: Secondary | ICD-10-CM | POA: Diagnosis not present

## 2020-12-23 DIAGNOSIS — E1139 Type 2 diabetes mellitus with other diabetic ophthalmic complication: Secondary | ICD-10-CM | POA: Insufficient documentation

## 2020-12-23 DIAGNOSIS — R109 Unspecified abdominal pain: Secondary | ICD-10-CM | POA: Diagnosis not present

## 2020-12-23 DIAGNOSIS — E11319 Type 2 diabetes mellitus with unspecified diabetic retinopathy without macular edema: Secondary | ICD-10-CM | POA: Diagnosis not present

## 2020-12-23 DIAGNOSIS — J432 Centrilobular emphysema: Secondary | ICD-10-CM | POA: Diagnosis not present

## 2020-12-23 LAB — COMPREHENSIVE METABOLIC PANEL
ALT: 24 U/L (ref 0–44)
AST: 27 U/L (ref 15–41)
Albumin: 4.2 g/dL (ref 3.5–5.0)
Alkaline Phosphatase: 46 U/L (ref 38–126)
Anion gap: 11 (ref 5–15)
BUN: 12 mg/dL (ref 8–23)
CO2: 24 mmol/L (ref 22–32)
Calcium: 9.4 mg/dL (ref 8.9–10.3)
Chloride: 106 mmol/L (ref 98–111)
Creatinine, Ser: 1 mg/dL (ref 0.61–1.24)
GFR, Estimated: >60 mL/min (ref >60)
Glucose, Bld: 149 mg/dL — ABNORMAL HIGH (ref 70–99)
Potassium: 3.6 mmol/L (ref 3.5–5.1)
Sodium: 141 mmol/L (ref 135–145)
Total Bilirubin: 1.1 mg/dL (ref 0.3–1.2)
Total Protein: 6.8 g/dL (ref 6.5–8.1)

## 2020-12-23 LAB — URINALYSIS, ROUTINE W REFLEX MICROSCOPIC
Bilirubin Urine: NEGATIVE
Glucose, UA: NEGATIVE mg/dL
Hgb urine dipstick: NEGATIVE
Ketones, ur: NEGATIVE mg/dL
Leukocytes,Ua: NEGATIVE
Nitrite: NEGATIVE
Protein, ur: NEGATIVE mg/dL
Specific Gravity, Urine: 1.009 (ref 1.005–1.030)
pH: 7 (ref 5.0–8.0)

## 2020-12-23 LAB — CBC
HCT: 44.8 % (ref 39.0–52.0)
Hemoglobin: 15 g/dL (ref 13.0–17.0)
MCH: 28.6 pg (ref 26.0–34.0)
MCHC: 33.5 g/dL (ref 30.0–36.0)
MCV: 85.5 fL (ref 80.0–100.0)
Platelets: 218 10*3/uL (ref 150–400)
RBC: 5.24 MIL/uL (ref 4.22–5.81)
RDW: 14.1 % (ref 11.5–15.5)
WBC: 16.3 10*3/uL — ABNORMAL HIGH (ref 4.0–10.5)
nRBC: 0 % (ref 0.0–0.2)

## 2020-12-23 LAB — TROPONIN I (HIGH SENSITIVITY)
Troponin I (High Sensitivity): <2 ng/L (ref <18)
Troponin I (High Sensitivity): 2 ng/L (ref <18)

## 2020-12-23 MED ORDER — MELOXICAM 7.5 MG PO TABS: 7.5000 mg | ORAL_TABLET | Freq: Every day | ORAL | 0 refills | Status: AC

## 2020-12-23 MED ORDER — IOHEXOL 350 MG/ML SOLN
100.0000 mL | Freq: Once | INTRAVENOUS | Status: AC | PRN
  Administered 2020-12-23: 100 mL via INTRAVENOUS

## 2020-12-23 MED ORDER — FENTANYL CITRATE (PF) 100 MCG/2ML IJ SOLN
25.0000 ug | Freq: Once | INTRAMUSCULAR | Status: AC
  Administered 2020-12-23: 25 ug via INTRAVENOUS
  Filled 2020-12-23: qty 2

## 2020-12-23 NOTE — ED Provider Notes (Signed)
[facility] DEPT Provider Note   CSN: [id number] Arrival date & time: 12/23/20  0813     History Chief Complaint  Patient presents with  . breast numbness  . Breast Pain    [name] is a 80 y.o. male.  HPI    80 year old man history of type 2 diabetes, hypertension, hyperlipidemia who is somewhat of a limited historian presents today complaining of right-sided nipple tenderness and pain in the right flank to chest wall as well as pain in the right mid back.  He denies any injury.  He states this began about 2 weeks ago.  Review of records reveals he was seen by Dr. Yong Channel.  Patient states that his pain in the nipple had gotten better at that time.  He states that he began having pain in the back and side about a half a week after that.  However, he does state that he is unable to tell me exactly what the time frames were even its relation to when he saw Dr. Yong Channel.  He is able to tell me that Dr. Yong Channel ordered an outpatient mammogram.  Is any discharge from the nipple area.  He has not noted any warmth or swelling.  Pain is worse with laying back or sitting.  He does not feel that it is worse with any exertion and he denies any dyspnea or cough.  He reports that he has completed his COVID vaccines including booster.  He denies any recent exposures, cough, fever, nasal congestion, or sore throat or other signs or symptoms consistent with COVID.  He states that his wife dropped him off and is in the hospital but is sitting out in the car at this time. Past Medical History:  Diagnosis Date  . Arthritis   . Atypical mole 04/02/1999   (MODERATE) LEFT BACK WAIST LINE DR Tonia Brooms  . Basal cell carcinoma 03/18/2017   LEFT NECK CX3 5FU  . Cataract   . Colon polyps   . DDD (degenerative disc disease), lumbar   . Depression    PTSD  . Diabetes mellitus   . Diabetic retinopathy   . Diverticulosis of colon   . Elevated PSA   . ERECTILE DYSFUNCTION 12/30/2007    No rx.     Marland Kitchen GERD (gastroesophageal reflux disease)   . Hyperlipidemia   . Hypertension   . Hypothyroidism   . IBS (irritable bowel syndrome)   . Leukemia ([place]) 01/2014  . Macular degeneration    followed by ophthalmology  . Nevus, atypical 06/01/2001   (MODERTE) RIGHT BUTTOCKS WIDER SHAVE DR GRUBER  . OSA (obstructive sleep apnea)   . PONV (postoperative nausea and vomiting)   . Prostate cancer ([place])   . PTSD (post-traumatic stress disorder)    [facility]  . Sleep apnea     Patient Active Problem List   Diagnosis Date Noted  . Vitamin D deficiency 11/01/2020  . Bell's palsy 05/08/2020  . Senile purpura ([place]) 05/02/2020  . 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 Ear pressure, left 04/14/2017  . Temporomandibular joint (TMJ) pain 04/14/2017  . Personal history of skin cancer 04/02/2017  . Pain syndrome, chronic 02/01/2016  . 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 myelopathy or radiculopathy 02/01/2016  . Chronic bilateral low back pain without sciatica 12/12/2015  . Tremor 08/11/2015  . GERD (gastroesophageal reflux disease) 12/08/2014  . Hypothyroidism   . Chronic lymphocytic leukemia ([place]) 05/16/2014  . History of prostate cancer 01/24/2014  . Lapband APL +  HH repair 08/12/2013  . Insomnia 09/21/2009  . History of colonic polyps 08/22/2008  . 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 Diabetic retinopathy ([place]) 09/24/2007  . Hyperlipidemia associated with type 2 diabetes mellitus ([place]) 09/24/2007  . OSA on CPAP 09/24/2007  . Hypertension associated with diabetes ([place]) 09/24/2007    Past Surgical History:  Procedure Laterality Date  . CHOLECYSTECTOMY    . COLONOSCOPY  03/25/12, 09/28/08  . ESOPHAGOGASTRODUODENOSCOPY N/A 10/12/2014   Procedure: ESOPHAGOGASTRODUODENOSCOPY (EGD);  Surgeon: Gatha Mayer, MD;  Location: [facility];  Service: Endoscopy;  Laterality: N/A;  . ESOPHAGOGASTRODUODENOSCOPY ENDOSCOPY  09/28/08  . FOOT  SURGERY Bilateral   . HEMORRHOID SURGERY    . LAPAROSCOPIC GASTRIC BANDING  03/05/11   weight loss  . LAPAROSCOPIC GASTRIC BANDING WITH HIATAL HERNIA REPAIR  03/05/2011  . LYMPHADENECTOMY Bilateral 01/24/2014   Procedure: LYMPHADENECTOMY;  Surgeon: Dutch Gray, MD;  Location: [facility];  Service: Urology;  Laterality: Bilateral;  . ORIF TIBIA FRACTURE Right   . PENILE PROSTHESIS IMPLANT    . PROSTATE SURGERY  01/2014  . ROBOT ASSISTED LAPAROSCOPIC RADICAL PROSTATECTOMY N/A 01/24/2014   Procedure: ROBOTIC ASSISTED LAPAROSCOPIC RADICAL PROSTATECTOMY LEVEL 3;  Surgeon: Dutch Gray, MD;  Location: [facility];  Service: Urology;  Laterality: N/A;  . SHOULDER SURGERY Right 2011  . TONSILLECTOMY     age 39  . VASECTOMY         Family History  Problem Relation Age of Onset  . Hypertension Mother   . Stomach cancer Mother 8  . Alcohol abuse Brother   . Hypertension Paternal Uncle   . Heart attack Paternal Uncle   . Colon cancer Neg Hx   . Colon polyps Neg Hx   . Rectal cancer Neg Hx     Social History   Tobacco Use  . Smoking status: Former Smoker    Packs/day: 1.00    Years: 20.00    Pack years: 20.00    Types: Cigarettes    Quit date: 02/25/1981    Years since quitting: 39.8  . Smokeless tobacco: Never Used  Vaping Use  . Vaping Use: Never used  Substance Use Topics  . Alcohol use: No    Alcohol/week: 0.0 standard drinks    Comment: stopped drinking 76 or 77   . Drug use: No    Home Medications Prior to Admission medications   Medication Sig Start Date End Date Taking? Authorizing Provider  amLODipine (NORVASC) 10 MG tablet Take 10 mg by mouth daily. 09/18/20   [provider]  azelastine (ASTELIN) 0.1 % nasal spray Place 1 spray into both nostrils 2 (two) times daily. Use in each nostril as directed 12/12/20   [name], MD  Cholecalciferol 50 MCG (2000 UT) TABS Take by mouth.    [provider]  fenofibrate 160 MG tablet Take 160 mg by mouth daily.     [provider]  glucose blood test strip Use to test 4 times daily. E11.9 10/26/19   [name], MD  hydrochlorothiazide (HYDRODIURIL) 25 MG tablet Take 25 mg by mouth daily.    [provider]  insulin aspart (NOVOLOG) 100 UNIT/ML injection Inject 30 Units into the skin 3 (three) times daily with meals. When sugar is elevated    [provider]  insulin glargine (LANTUS) 100 UNIT/ML injection Inject 65 Units into the skin at bedtime.    [provider]  lansoprazole (PREVACID) 30 MG capsule Take 1 capsule (  30 mg total) by mouth daily at 12 noon. Patient not taking: No sig reported 03/09/19   [name], MD  levothyroxine (SYNTHROID, LEVOTHROID) 50 MCG tablet Take 50 mcg by mouth daily before breakfast.    [provider]  losartan (COZAAR) 100 MG tablet Take 100 mg by mouth daily.    [provider]  mirtazapine (REMERON) 15 MG tablet Take 15 mg by mouth at bedtime.    [provider]  OVER THE COUNTER MEDICATION PreserVision for eyes    [provider]  potassium chloride SA (K-DUR,KLOR-CON) 20 MEQ tablet Take 20 mEq by mouth daily.    [provider]  Semaglutide (OZEMPIC, 0.25 OR 0.5 MG/DOSE, [place]) Inject 0.5 mg into the skin once a week.    [provider]  simvastatin (ZOCOR) 80 MG tablet Take half tablet (40 mg) by mouth once daily 09/18/20   [provider]  sucralfate (CARAFATE) 1 G tablet Take 1 g by mouth 4 (four) times daily -  with meals and at bedtime.    [provider]  traMADol (ULTRAM) 50 MG tablet Take 50 mg by mouth every 6 (six) hours as needed. for pain 06/29/18   [provider]    Allergies    Morphine sulfate  Review of Systems   Review of Systems  All other systems reviewed and are negative.   Physical Exam Updated Vital Signs BP (!) 164/92 (BP Location: Left Arm)   Pulse (!) 108   Temp 98.1 F (36.7 C) (Oral)   Resp 16   Ht 1.854 m  (6\' 1" )   Wt 110.1 kg   SpO2 97%   BMI 32.03 kg/m   Physical Exam Vitals and nursing note reviewed.  Constitutional:      Appearance: Normal appearance.  HENT:     Head: Normocephalic.     Right Ear: External ear normal.     Left Ear: External ear normal.     Nose: Nose normal.     Mouth/Throat:     Pharynx: Oropharynx is clear.  Eyes:     Pupils: Pupils are equal, round, and reactive to light.  Cardiovascular:     Rate and Rhythm: Normal rate.  Pulmonary:     Comments: Mild tenderness palpation right lateral chest Lungs are clear to auscultation Abdominal:     General: Abdomen is flat. Bowel sounds are normal.     Palpations: Abdomen is soft.     Comments: Mild right upper quadrant tenderness to palpation  Musculoskeletal:     Cervical back: Normal range of motion.     Comments: Some tenderness to palpation lateral to mid thoracic spine  Skin:    General: Skin is warm and dry.     Capillary Refill: Capillary refill takes less than 2 seconds.  Neurological:     General: No focal deficit present.     Mental Status: He is alert.  Psychiatric:        Mood and Affect: Mood normal.     ED Results / Procedures / Treatments   Labs (all labs ordered are listed, but only abnormal results are displayed) Labs Reviewed - No data to display  EKG EKG Interpretation  Date/Time:  Saturday December 23 2020 12:27:11 EST Ventricular Rate:  79 PR Interval:    QRS Duration: 102 QT Interval:  385 QTC Calculation: 442 R Axis:   -41 Text Interpretation: Normal sinus rhythm Paired ventricular premature complexes Left anterior fascicular block Low voltage, precordial  leads Consider anterior infarct Reconfirmed by Pattricia Boss [phone]) on 12/23/2020 12:42:32 PM   Radiology No results found.  Procedures Procedures (including critical care time)  Medications Ordered in ED Medications - No data to display  ED Course  I have reviewed the triage vital signs and the nursing  notes.  Pertinent labs & imaging results that were available during my care of the patient were reviewed by me and considered in my medical decision making (see chart for details).    MDM Rules/Calculators/A&P                          80 year old male who has been having some right-sided chest pain.  It has presented as pain and irritation of the nipple and now the side and right upper back.  No rash is present,  His PMD has arranged for op [place] and mammogram.  He is here today due to the change in nature with the lateral and posterior aspect.  It is worsened by sitting and lying and is not worse with exertion nor is dyspnea or infectious etiology present.  EKG and troponin obtained without acute changes- only single troponin obtained due to ongoing symptoms.  Right back pain mildly reproducibly as well as mild ruq pain.  CT did not reveal any acute abnormalities including no pe, infiltrate, or acute intraabdominal processes. Hepatomegaly noted. Labs normal with exception of leukocytosis stable from prior. Percocet ordered for pain.  Discussed above with patient and wife  Plan nsaid and follow up with us/mammogram and pmd Final Clinical Impression(s) / ED Diagnoses Final diagnoses:  Chest wall pain    Rx / DC Orders ED Discharge Orders    None       Pattricia Boss, MD 12/23/20 1245

## 2020-12-23 NOTE — ED Triage Notes (Signed)
Patient reports that he began having right breast numbness and intermittent right nipple tenderness x 2 weeks ago and the pain is radiating into his right mid back. Patient went to his PCP and states he has an [place] and mammogram ordered for 01/18/21. Patient states the pain has worsened.

## 2020-12-23 NOTE — Discharge Instructions (Addendum)
Please take mobic daily for pain. CAll your doctor for follow up this week. Return if you are worse at any time.

## 2020-12-26 ENCOUNTER — Other Ambulatory Visit: Payer: Medicare Other

## 2020-12-26 ENCOUNTER — Telehealth: Payer: Self-pay

## 2020-12-26 ENCOUNTER — Ambulatory Visit
Admission: RE | Admit: 2020-12-26 | Discharge: 2020-12-26 | Disposition: A | Discharge status: Home / Self Care | Payer: Medicare Other | Source: Ambulatory Visit | Attending: Family Medicine | Admitting: Family Medicine

## 2020-12-26 ENCOUNTER — Encounter: Payer: Self-pay | Admitting: Family Medicine

## 2020-12-26 ENCOUNTER — Other Ambulatory Visit: Payer: Self-pay

## 2020-12-26 ENCOUNTER — Ambulatory Visit: Payer: Medicare Other

## 2020-12-26 DIAGNOSIS — N62 Hypertrophy of breast: Secondary | ICD-10-CM | POA: Diagnosis not present

## 2020-12-26 DIAGNOSIS — N644 Mastodynia: Secondary | ICD-10-CM | POA: Diagnosis not present

## 2020-12-26 DIAGNOSIS — R922 Inconclusive mammogram: Secondary | ICD-10-CM | POA: Diagnosis not present

## 2020-12-26 NOTE — Telephone Encounter (Signed)
See below

## 2020-12-26 NOTE — Telephone Encounter (Signed)
8 40 Thursday- can reduce cpe at 8 20 to 20 minutes

## 2020-12-26 NOTE — Telephone Encounter (Signed)
Patients wife states patient needs to discuss test results from test get had done today, with dr hunter, and is requesting to be worked in either Thursday morning or any time on Friday of this week.  Please advise  Thank you.

## 2020-12-27 NOTE — Progress Notes (Signed)
Phone [phone] In person visit   Subjective:   [name] is a 80 y.o. year old very pleasant male patient who presents for/with See problem oriented charting Chief Complaint  Patient presents with  . discussing result     ER test results.    This visit occurred during the SARS-CoV-2 public health emergency.  Safety protocols were in place, including screening questions prior to the visit, additional usage of staff PPE, and extensive cleaning of exam room while observing appropriate contact time as indicated for disinfecting solutions.   Past Medical History-  Patient Active Problem List   Diagnosis Date Noted  . Chronic bilateral low back pain without sciatica 12/12/2015    Priority: High  . Chronic lymphocytic leukemia ([place]) 05/16/2014    Priority: High  . History of prostate cancer 01/24/2014    Priority: High  . 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 Diabetic retinopathy ([place]) 09/24/2007    Priority: High  . Tremor 08/11/2015    Priority: Medium  . Hypothyroidism     Priority: Medium  . 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 Insomnia 09/21/2009    Priority: Medium  . Hyperlipidemia associated with type 2 diabetes mellitus ([place]) 09/24/2007    Priority: Medium  . OSA on CPAP 09/24/2007    Priority: Medium  . Hypertension associated with diabetes ([place]) 09/24/2007    Priority: Medium  . Personal history of skin cancer 04/02/2017    Priority: Low  . GERD (gastroesophageal reflux disease) 12/08/2014    Priority: Low  . History of colonic polyps 08/22/2008    Priority: Low  . Gynecomastia 12/28/2020  . Vitamin D deficiency 11/01/2020  . Bell's palsy 05/08/2020  . Senile purpura ([place]) 05/02/2020  . 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 Ear pressure, left 04/14/2017  . Temporomandibular joint (TMJ) pain 04/14/2017  . Pain syndrome, chronic 02/01/2016  . Spondylosis of lumbar region  without myelopathy or radiculopathy 02/01/2016    Medications- reviewed and updated Current Outpatient Medications  Medication Sig Dispense Refill  . amLODipine (NORVASC) 10 MG tablet Take 10 mg by mouth daily.    Marland Kitchen azelastine (ASTELIN) 0.1 % nasal spray Place 1 spray into both nostrils 2 (two) times daily. Use in each nostril as directed 30 mL 12  . Cholecalciferol 50 MCG (2000 UT) TABS Take by mouth.    . fenofibrate 160 MG tablet Take 160 mg by mouth daily.    . fluticasone (FLONASE) 50 MCG/ACT nasal spray Place into both nostrils.    Marland Kitchen glucose 4 GM chewable tablet Chew by mouth.    Marland Kitchen glucose blood test strip Use to test 4 times daily. E11.9 100 each 12  . hydrochlorothiazide (HYDRODIURIL) 25 MG tablet Take 25 mg by mouth daily.    . insulin aspart (NOVOLOG) 100 UNIT/ML injection Inject 30 Units into the skin 3 (three) times daily with meals. When sugar is elevated    . insulin glargine (LANTUS) 100 UNIT/ML injection Inject 65 Units into the skin at bedtime.    . lansoprazole (PREVACID) 30 MG capsule Take 1 capsule (30 mg total) by mouth daily at 12 noon. 30 capsule 5  . levothyroxine (SYNTHROID, LEVOTHROID) 50 MCG tablet Take 50 mcg by mouth daily before breakfast.    . losartan (COZAAR) 100 MG tablet Take 100 mg by mouth daily.    . meloxicam (MOBIC) 7.5 MG tablet Take 1 tablet (7.5  mg total) by mouth daily for 14 days. 14 tablet 0  . mirtazapine (REMERON) 15 MG tablet Take 15 mg by mouth at bedtime.    Marland Kitchen OVER THE COUNTER MEDICATION PreserVision for eyes    . potassium chloride SA (K-DUR,KLOR-CON) 20 MEQ tablet Take 20 mEq by mouth daily.    . Semaglutide (OZEMPIC, 0.25 OR 0.5 MG/DOSE, [place]) Inject 0.5 mg into the skin once a week.    . simvastatin (ZOCOR) 80 MG tablet Take half tablet (40 mg) by mouth once daily    . sucralfate (CARAFATE) 1 G tablet Take 1 g by mouth 4 (four) times daily -  with meals and at bedtime.    . traMADol (ULTRAM) 50 MG tablet Take 50 mg by mouth every 6 (six)  hours as needed. for pain  0   No current facility-administered medications for this visit.     Objective:  BP 136/74   Pulse 71   Temp 98.7 F (37.1 C) (Temporal)   Ht 6\' 1"  (1.854 m)   Wt 247 lb 6.4 oz (112.2 kg)   SpO2 97%   BMI 32.64 kg/m  Gen: NAD, resting comfortably CV: RRR no murmurs rubs or gallops Tenderness behind right nipple with palpation Lungs: CTAB no crackles, wheeze, rhonchi Ext: no edema Skin: warm, dry    Assessment and Plan  #gynecomastia S: patient had ER trip for right chest pain and back pain. Reflecting back now thinks back issues from pushing things up into attic with one hand. Meloxicam was given by ER and really helped with back  Also was still having nipple pain which we saw him before for.  Mammogram ended up showing gynecomastia on both sides- right >left. No signs of cancer. Last visit he said it was not hurting- but has had pain when laying down or waking up.  A/P: several potential contributing factors  -fatty liver - amlodipine 1% chance per up to date - hctz potentially- not listed on up to date -simvastain potentially- not listed on up to date -decreasing testosterone levels with age We discussed these meds remain important and he prefers to stay on them. Pain is minimal and he will try heating pad or tylenol before bed if needed  #Hyperlipidemia S: Controlled on simvastatin 40 mg-half of 80 mg tablet  [facility] also added gemfibrozil with triglycerides 379-currently on fenofibrate Lab Results  Component Value Date   CHOL 117 01/26/2020   HDL 50 01/26/2020   LDLCALC 95 01/26/2020   LDLDIRECT 47.0 11/05/2019   TRIG 203 (A) 01/26/2020   CHOLHDL 2 07/16/2017  A/P:  Reasonable control on last check   #Hypertension S: Controlled on hydrochlorothiazide 25 mg, losartan 100 mg, amlodipine 10 mg BP Readings from Last 3 Encounters:  12/28/20 136/74  12/23/20 (!) 150/96  12/12/20 130/76  A/P:  I dont think we have space to reduce meds in  regards to gynecomastia- we will contiue current meds with good control on repeat today.  Recommended follow up: keep June visit or sooner if needed Future Appointments  Date Time Provider [place]  01/01/2021  7:30 AM Hayden Pedro, MD [facility] None  05/04/2021  1:20 PM [name], MD [facility]  08/08/2021  9:30 AM [facility] None  08/08/2021 10:00 AM Wyatt Portela, MD [facility] None  09/11/2021  9:15 AM [name], MD [facility]  12/05/2021  1:30 PM [facility] PHARMACIST [facility]    Lab/Order associations:   ICD-10-CM   1.  Hypertension associated with diabetes ([place])  E11.59    I15.2   2. Gynecomastia  N62   3. Hyperlipidemia associated with type 2 diabetes mellitus ([place])  E11.69    E78.5    Return precautions advised.  [name], MD

## 2020-12-27 NOTE — Patient Instructions (Addendum)
We discussed these meds remain important and he prefers to stay on them. Pain is minimal and he will try heating pad or tylenol before bed if needed  Recommended follow up: keep June visit or sooner if needed  Schedule monthly b12 as well after feb 11

## 2020-12-27 NOTE — Telephone Encounter (Signed)
Patient scheduled.

## 2020-12-27 NOTE — Telephone Encounter (Signed)
See below

## 2020-12-28 ENCOUNTER — Other Ambulatory Visit: Payer: Self-pay

## 2020-12-28 ENCOUNTER — Ambulatory Visit (INDEPENDENT_AMBULATORY_CARE_PROVIDER_SITE_OTHER): Payer: Medicare Other | Admitting: Family Medicine

## 2020-12-28 ENCOUNTER — Encounter: Payer: Self-pay | Admitting: Family Medicine

## 2020-12-28 VITALS — BP 136/74 | HR 71 | Temp 98.7°F | Ht 73.0 in | Wt 247.4 lb

## 2020-12-28 DIAGNOSIS — E1169 Type 2 diabetes mellitus with other specified complication: Secondary | ICD-10-CM | POA: Diagnosis not present

## 2020-12-28 DIAGNOSIS — N62 Hypertrophy of breast: Secondary | ICD-10-CM | POA: Diagnosis not present

## 2020-12-28 DIAGNOSIS — E1159 Type 2 diabetes mellitus with other circulatory complications: Secondary | ICD-10-CM

## 2020-12-28 DIAGNOSIS — I152 Hypertension secondary to endocrine disorders: Secondary | ICD-10-CM | POA: Diagnosis not present

## 2020-12-28 DIAGNOSIS — E785 Hyperlipidemia, unspecified: Secondary | ICD-10-CM | POA: Diagnosis not present

## 2020-12-28 HISTORY — DX: Hypertrophy of breast: N62

## 2021-01-01 ENCOUNTER — Encounter (INDEPENDENT_AMBULATORY_CARE_PROVIDER_SITE_OTHER): Payer: Medicare Other | Admitting: Ophthalmology

## 2021-01-01 ENCOUNTER — Other Ambulatory Visit: Payer: Self-pay

## 2021-01-01 DIAGNOSIS — H353231 Exudative age-related macular degeneration, bilateral, with active choroidal neovascularization: Secondary | ICD-10-CM

## 2021-01-01 DIAGNOSIS — H43813 Vitreous degeneration, bilateral: Secondary | ICD-10-CM | POA: Diagnosis not present

## 2021-01-01 DIAGNOSIS — H35033 Hypertensive retinopathy, bilateral: Secondary | ICD-10-CM

## 2021-01-01 DIAGNOSIS — I10 Essential (primary) hypertension: Secondary | ICD-10-CM

## 2021-01-02 ENCOUNTER — Encounter: Payer: Self-pay | Admitting: Family Medicine

## 2021-01-02 ENCOUNTER — Telehealth: Payer: Self-pay

## 2021-01-02 NOTE — Progress Notes (Signed)
Chronic Care Management Pharmacy Assistant   Name: [name]  MRN: [id number] DOB: 03-Aug-1941   Reason for Encounter: Disease State  PCP : [name], MD  Allergies:   Allergies  Allergen Reactions  . Morphine Sulfate Itching and Rash    Medications: Outpatient Encounter Medications as of 01/02/2021  Medication Sig Note  . amLODipine (NORVASC) 10 MG tablet Take 10 mg by mouth daily.   Marland Kitchen azelastine (ASTELIN) 0.1 % nasal spray Place 1 spray into both nostrils 2 (two) times daily. Use in each nostril as directed   . Cholecalciferol 50 MCG (2000 UT) TABS Take by mouth.   . fenofibrate 160 MG tablet Take 160 mg by mouth daily.   . fluticasone (FLONASE) 50 MCG/ACT nasal spray Place into both nostrils.   Marland Kitchen glucose 4 GM chewable tablet Chew by mouth.   Marland Kitchen glucose blood test strip Use to test 4 times daily. E11.9   . hydrochlorothiazide (HYDRODIURIL) 25 MG tablet Take 25 mg by mouth daily.   . insulin aspart (NOVOLOG) 100 UNIT/ML injection Inject 30 Units into the skin 3 (three) times daily with meals. When sugar is elevated   . insulin glargine (LANTUS) 100 UNIT/ML injection Inject 65 Units into the skin at bedtime.   . lansoprazole (PREVACID) 30 MG capsule Take 1 capsule (30 mg total) by mouth daily at 12 noon.   Marland Kitchen levothyroxine (SYNTHROID, LEVOTHROID) 50 MCG tablet Take 50 mcg by mouth daily before breakfast.   . losartan (COZAAR) 100 MG tablet Take 100 mg by mouth daily.   . meloxicam (MOBIC) 7.5 MG tablet Take 1 tablet (7.5 mg total) by mouth daily for 14 days.   . mirtazapine (REMERON) 15 MG tablet Take 15 mg by mouth at bedtime.   Marland Kitchen OVER THE COUNTER MEDICATION PreserVision for eyes   . potassium chloride SA (K-DUR,KLOR-CON) 20 MEQ tablet Take 20 mEq by mouth daily.   . Semaglutide (OZEMPIC, 0.25 OR 0.5 MG/DOSE, [place]) Inject 0.5 mg into the skin once a week. 11/27/2020: Pt stopped medication last week  . simvastatin (ZOCOR) 80 MG tablet Take half tablet (40 mg) by mouth  once daily   . sucralfate (CARAFATE) 1 G tablet Take 1 g by mouth 4 (four) times daily -  with meals and at bedtime.   . traMADol (ULTRAM) 50 MG tablet Take 50 mg by mouth every 6 (six) hours as needed. for pain    No facility-administered encounter medications on file as of 01/02/2021.    Current Diagnosis: Patient Active Problem List   Diagnosis Date Noted  . Gynecomastia 12/28/2020  . Vitamin D deficiency 11/01/2020  . Bell's palsy 05/08/2020  . Senile purpura ([place]) 05/02/2020  . 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 Ear pressure, left 04/14/2017  . Temporomandibular joint (TMJ) pain 04/14/2017  . Personal history of skin cancer 04/02/2017  . Pain syndrome, chronic 02/01/2016  . 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 myelopathy or radiculopathy 02/01/2016  . Chronic bilateral low back pain without sciatica 12/12/2015  . Tremor 08/11/2015  . GERD (gastroesophageal reflux disease) 12/08/2014  . Hypothyroidism   . Chronic lymphocytic leukemia ([place]) 05/16/2014  . History of prostate cancer 01/24/2014  . Lapband APL + HH repair 08/12/2013  . Insomnia 09/21/2009  . History of colonic polyps 08/22/2008  . 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 Diabetic retinopathy ([place]) 09/24/2007  . Hyperlipidemia associated with type 2 diabetes mellitus ([place]) 09/24/2007  . OSA on CPAP 09/24/2007  .  Hypertension associated with diabetes ([place]) 09/24/2007    Recent Relevant Labs: Lab Results  Component Value Date/Time   HGBA1C 6.9 09/18/2020 12:00 AM   HGBA1C 7.4 07/10/2020 12:00 AM   MICROALBUR 2.58 02/27/2016 12:00 AM   MICROALBUR 14.8 (H) 11/16/2013 08:15 AM    Kidney Function Lab Results  Component Value Date/Time   CREATININE 1.00 12/23/2020 09:36 AM   CREATININE 1.2 09/18/2020 12:00 AM   CREATININE 0.85 05/02/2020 01:23 PM   CREATININE 1.08 08/26/2019 09:08 AM   CREATININE 1.2 10/30/2017 08:45 AM   CREATININE 1.0 04/29/2017 09:09 AM   GFR 87.47  05/02/2020 01:23 PM   GFRNONAA >60 12/23/2020 09:36 AM   GFRNONAA >60 08/26/2019 09:08 AM   GFRAA >60 08/26/2019 09:08 AM    . Current antihyperglycemic regimen:           - insulin aspart (NOVOLOG) 100 UNIT/ML injection          - insulin glargine (LANTUS) 100 UNIT/ML injection . What recent interventions/DTPs have been made to improve glycemic control:  o None noted  . Have there been any recent hospitalizations or ED visits since last visit with CPP? Yes           12-23-20 Patient was seen in ED with complaints of right-sided nipple tenderness and pain in the right flank to chest wall as well. CT did not show any acute abnormalities . Labs were normal .  . Patient denies hypoglycemic symptoms, including None  . Patient denies hyperglycemic symptoms, including none   . How often are you checking your blood sugar? once daily and 3-4 times daily   Patients wife stated patient checks his blood sugars 4 times a day   . What are your blood sugars ranging?                           Patients wife stated his number have been ranging 109 , 129 , 144    . During the week, how often does your blood glucose drop below 70? Never       Patients wife stated she keeps a gallon of orange juice in case . Patients wife states if he does seem like his blood sugar is low she will go ahead and give him orange juice. Patient states he will get dizzy if his blood sugar is below 70 however it has not been below 70 in a while .   Marland Kitchen Are you checking your feet daily/regularly?            Patients wife states she is checking his feet daily and has not noted any scraps bruising or cuts. Encouraged wife to continue to check daily .  Adherence Review: Is the patient currently on a STATIN medication? No Is the patient currently on ACE/ARB medication? Yes Does the patient have >5 day gap between last estimated fill dates? CPP to review   [place] Pharmacist  Assistant [phone]   Follow-Up:  Pharmacist Review

## 2021-01-04 ENCOUNTER — Other Ambulatory Visit: Payer: Self-pay

## 2021-01-04 DIAGNOSIS — N62 Hypertrophy of breast: Secondary | ICD-10-CM

## 2021-01-12 ENCOUNTER — Encounter: Payer: Self-pay | Admitting: Family Medicine

## 2021-01-18 ENCOUNTER — Other Ambulatory Visit: Payer: Self-pay

## 2021-01-18 ENCOUNTER — Other Ambulatory Visit: Payer: Medicare Other

## 2021-01-18 ENCOUNTER — Ambulatory Visit (INDEPENDENT_AMBULATORY_CARE_PROVIDER_SITE_OTHER): Payer: Medicare Other

## 2021-01-18 DIAGNOSIS — E538 Deficiency of other specified B group vitamins: Secondary | ICD-10-CM

## 2021-01-18 MED ORDER — CYANOCOBALAMIN 1000 MCG/ML IJ SOLN
1000.0000 ug | Freq: Once | INTRAMUSCULAR | Status: AC
  Administered 2021-01-18: 1000 ug via INTRAMUSCULAR

## 2021-02-14 ENCOUNTER — Other Ambulatory Visit: Payer: Self-pay

## 2021-02-14 ENCOUNTER — Ambulatory Visit (INDEPENDENT_AMBULATORY_CARE_PROVIDER_SITE_OTHER): Payer: Medicare Other

## 2021-02-14 DIAGNOSIS — E538 Deficiency of other specified B group vitamins: Secondary | ICD-10-CM

## 2021-02-14 MED ORDER — CYANOCOBALAMIN 1000 MCG/ML IJ SOLN
1000.0000 ug | Freq: Once | INTRAMUSCULAR | Status: AC
  Administered 2021-02-14: 1000 ug via INTRAMUSCULAR

## 2021-02-14 NOTE — Progress Notes (Signed)
I have reviewed and agree with note, evaluation, plan.   Kylin Genna, MD  

## 2021-02-14 NOTE — Progress Notes (Signed)
[name] age 80, came in for a b12 shot. Ordered by [name], MD. Gave CYANOCOBALAMIN 1036mcg IM in the left deltoid. Patient tolerated injection well. Is aware of next appointment.

## 2021-02-26 ENCOUNTER — Other Ambulatory Visit: Payer: Self-pay

## 2021-02-26 ENCOUNTER — Encounter (INDEPENDENT_AMBULATORY_CARE_PROVIDER_SITE_OTHER): Payer: Medicare Other | Admitting: Ophthalmology

## 2021-02-26 DIAGNOSIS — H43813 Vitreous degeneration, bilateral: Secondary | ICD-10-CM

## 2021-02-26 DIAGNOSIS — I10 Essential (primary) hypertension: Secondary | ICD-10-CM

## 2021-02-26 DIAGNOSIS — H353231 Exudative age-related macular degeneration, bilateral, with active choroidal neovascularization: Secondary | ICD-10-CM

## 2021-02-26 DIAGNOSIS — H35033 Hypertensive retinopathy, bilateral: Secondary | ICD-10-CM | POA: Diagnosis not present

## 2021-02-26 DIAGNOSIS — H35372 Puckering of macula, left eye: Secondary | ICD-10-CM | POA: Diagnosis not present

## 2021-03-08 ENCOUNTER — Encounter: Payer: Self-pay | Admitting: *Deleted

## 2021-03-08 ENCOUNTER — Other Ambulatory Visit: Payer: Self-pay

## 2021-03-08 ENCOUNTER — Ambulatory Visit (INDEPENDENT_AMBULATORY_CARE_PROVIDER_SITE_OTHER): Payer: Medicare Other | Admitting: *Deleted

## 2021-03-08 DIAGNOSIS — E538 Deficiency of other specified B group vitamins: Secondary | ICD-10-CM

## 2021-03-08 MED ORDER — CYANOCOBALAMIN 1000 MCG/ML IJ SOLN
1000.0000 ug | Freq: Once | INTRAMUSCULAR | Status: AC
  Administered 2021-03-08: 1000 ug via INTRAMUSCULAR

## 2021-03-08 NOTE — Progress Notes (Signed)
Per orders of Dr. Yong Channel, injection of Cyanocobalamin 1000 mcg/ml given IM by Anselmo Pickler, LPN in right deltoid. Patient tolerated injection well. Patient will make appointment for 1 month.

## 2021-03-19 DIAGNOSIS — Z23 Encounter for immunization: Secondary | ICD-10-CM | POA: Diagnosis not present

## 2021-03-26 LAB — LIPID PANEL: Cholesterol: 105 (ref 0–200)

## 2021-03-28 ENCOUNTER — Telehealth: Payer: Self-pay

## 2021-03-28 NOTE — Chronic Care Management (AMB) (Signed)
Chronic Care Management Pharmacy Assistant   Name: [name]  MRN: [id number] DOB: March 29, 1941  Reason for Encounter: Diabetes Mellitus Disease State Call   Recent office visits:  No visits noted  Recent consult visits:  No visits noted  Hospital visits:  None in previous 6 months  Medications: Outpatient Encounter Medications as of 03/28/2021  Medication Sig Note  . amLODipine (NORVASC) 10 MG tablet Take 10 mg by mouth daily.   Marland Kitchen azelastine (ASTELIN) 0.1 % nasal spray Place 1 spray into both nostrils 2 (two) times daily. Use in each nostril as directed   . Cholecalciferol 50 MCG (2000 UT) TABS Take by mouth.   . fenofibrate 160 MG tablet Take 160 mg by mouth daily.   . fluticasone (FLONASE) 50 MCG/ACT nasal spray Place into both nostrils.   Marland Kitchen glucose 4 GM chewable tablet Chew by mouth.   Marland Kitchen glucose blood test strip Use to test 4 times daily. E11.9   . hydrochlorothiazide (HYDRODIURIL) 25 MG tablet Take 25 mg by mouth daily.   . insulin aspart (NOVOLOG) 100 UNIT/ML injection Inject 30 Units into the skin 3 (three) times daily with meals. When sugar is elevated   . insulin glargine (LANTUS) 100 UNIT/ML injection Inject 65 Units into the skin at bedtime.   . lansoprazole (PREVACID) 30 MG capsule Take 1 capsule (30 mg total) by mouth daily at 12 noon.   Marland Kitchen levothyroxine (SYNTHROID, LEVOTHROID) 50 MCG tablet Take 50 mcg by mouth daily before breakfast.   . losartan (COZAAR) 100 MG tablet Take 100 mg by mouth daily.   . mirtazapine (REMERON) 15 MG tablet Take 15 mg by mouth at bedtime.   Marland Kitchen OVER THE COUNTER MEDICATION PreserVision for eyes   . potassium chloride SA (K-DUR,KLOR-CON) 20 MEQ tablet Take 20 mEq by mouth daily.   . Semaglutide (OZEMPIC, 0.25 OR 0.5 MG/DOSE, [place]) Inject 0.5 mg into the skin once a week. 11/27/2020: Pt stopped medication last week  . simvastatin (ZOCOR) 80 MG tablet Take half tablet (40 mg) by mouth once daily   . sucralfate (CARAFATE) 1 G tablet Take 1  g by mouth 4 (four) times daily -  with meals and at bedtime.   . traMADol (ULTRAM) 50 MG tablet Take 50 mg by mouth every 6 (six) hours as needed. for pain    No facility-administered encounter medications on file as of 03/28/2021.    Recent Relevant Labs: Lab Results  Component Value Date/Time   HGBA1C 6.9 09/18/2020 12:00 AM   HGBA1C 7.4 07/10/2020 12:00 AM   MICROALBUR 2.58 02/27/2016 12:00 AM   MICROALBUR 14.8 (H) 11/16/2013 08:15 AM    Kidney Function Lab Results  Component Value Date/Time   CREATININE 1.00 12/23/2020 09:36 AM   CREATININE 1.2 09/18/2020 12:00 AM   CREATININE 0.85 05/02/2020 01:23 PM   CREATININE 1.08 08/26/2019 09:08 AM   CREATININE 1.2 10/30/2017 08:45 AM   CREATININE 1.0 04/29/2017 09:09 AM   GFR 87.47 05/02/2020 01:23 PM   GFRNONAA >60 12/23/2020 09:36 AM   GFRNONAA >60 08/26/2019 09:08 AM   GFRAA >60 08/26/2019 09:08 AM    Current antihyperglycemic regimen:   insulin aspart (NOVOLOG) 100 UNIT/ML injection- 30 units tid insulin glargine (LANTUS) 100 UNIT/ML injection- 50 units at bedtime  What recent interventions/DTPs have been made to improve glycemic control:  No recent interventions  Have there been any recent hospitalizations or ED visits since last visit with CPP? No   Patient reports hypoglycemic symptoms, including  weakness Patient denies hyperglycemic symptoms  How often are you checking your blood sugar?  Patient stated he checks his BS  3-4 times daily  What are your blood sugars ranging?  o Fasting: 90-100 o Before meals: n/a o After meals: 101-156 o Bedtime: n/a  During the week, how often does your blood glucose drop below 70? Patient stated he knows his BS has dropped below 70 in the past couple weeks, but he is unsure of the exact amount of times  Are you checking your feet daily/regularly?  Patient stated his wife checks his feet regularly.   Adherence Review: Is the patient currently on a STATIN medication? Yes Is  the patient currently on ACE/ARB medication? Yes  Does the patient have >5 day gap between last estimated fill dates? Patient receives prescriptions through [facility]  insulin aspart (NOVOLOG) 100 UNIT/ML injection- 30 units tid insulin glargine (LANTUS) 100 UNIT/ML injection- 50 units at bedtime  Star Rating Drugs: Patient receives prescriptions through [facility]  Simvastatin 80 mg Losartan 100 mg  [place]

## 2021-03-29 LAB — LIPID PANEL
Cholesterol: 105 (ref 0–200)
HDL: 42 (ref 35–70)
LDL Cholesterol: 11
Triglycerides: 258 — AB (ref 40–160)

## 2021-03-29 LAB — MICROALBUMIN, URINE: Microalb, Ur: 0.681

## 2021-03-29 LAB — HEMOGLOBIN A1C: Hemoglobin A1C: 6.5

## 2021-04-04 ENCOUNTER — Ambulatory Visit (INDEPENDENT_AMBULATORY_CARE_PROVIDER_SITE_OTHER): Payer: Medicare Other | Admitting: *Deleted

## 2021-04-04 ENCOUNTER — Other Ambulatory Visit: Payer: Self-pay

## 2021-04-04 DIAGNOSIS — E538 Deficiency of other specified B group vitamins: Secondary | ICD-10-CM | POA: Diagnosis not present

## 2021-04-04 MED ORDER — CYANOCOBALAMIN 1000 MCG/ML IJ SOLN
1000.0000 ug | Freq: Once | INTRAMUSCULAR | Status: AC
  Administered 2021-04-04: 1000 ug via INTRAMUSCULAR

## 2021-04-04 NOTE — Progress Notes (Signed)
Per orders of Dr. Hunter, injection of Cyanocobalamin 1000 mcg/ml IM given by Terryann Verbeek, LPN in right deltoid. [name] tolerated injection well. [name] will make appointment for 1 month   

## 2021-04-05 ENCOUNTER — Encounter: Payer: Self-pay | Admitting: Family Medicine

## 2021-04-06 ENCOUNTER — Encounter: Payer: Self-pay | Admitting: Family Medicine

## 2021-04-06 DIAGNOSIS — N62 Hypertrophy of breast: Secondary | ICD-10-CM | POA: Diagnosis not present

## 2021-04-06 DIAGNOSIS — Z9884 Bariatric surgery status: Secondary | ICD-10-CM | POA: Diagnosis not present

## 2021-04-10 ENCOUNTER — Encounter: Payer: Self-pay | Admitting: Family Medicine

## 2021-04-23 ENCOUNTER — Other Ambulatory Visit: Payer: Self-pay

## 2021-04-23 ENCOUNTER — Encounter (INDEPENDENT_AMBULATORY_CARE_PROVIDER_SITE_OTHER): Payer: Medicare Other | Admitting: Ophthalmology

## 2021-04-23 DIAGNOSIS — I10 Essential (primary) hypertension: Secondary | ICD-10-CM | POA: Diagnosis not present

## 2021-04-23 DIAGNOSIS — H353231 Exudative age-related macular degeneration, bilateral, with active choroidal neovascularization: Secondary | ICD-10-CM | POA: Diagnosis not present

## 2021-04-23 DIAGNOSIS — H43813 Vitreous degeneration, bilateral: Secondary | ICD-10-CM | POA: Diagnosis not present

## 2021-04-23 DIAGNOSIS — H35033 Hypertensive retinopathy, bilateral: Secondary | ICD-10-CM | POA: Diagnosis not present

## 2021-05-02 ENCOUNTER — Encounter: Payer: Self-pay | Admitting: Family Medicine

## 2021-05-02 ENCOUNTER — Ambulatory Visit: Payer: Medicare Other

## 2021-05-04 ENCOUNTER — Ambulatory Visit (INDEPENDENT_AMBULATORY_CARE_PROVIDER_SITE_OTHER): Payer: Medicare Other | Admitting: Family Medicine

## 2021-05-04 ENCOUNTER — Other Ambulatory Visit: Payer: Self-pay

## 2021-05-04 ENCOUNTER — Ambulatory Visit: Payer: Medicare Other | Admitting: Family Medicine

## 2021-05-04 ENCOUNTER — Encounter: Payer: Self-pay | Admitting: Family Medicine

## 2021-05-04 VITALS — BP 126/72 | HR 67 | Temp 98.6°F | Ht 73.0 in | Wt 252.0 lb

## 2021-05-04 DIAGNOSIS — E11319 Type 2 diabetes mellitus with unspecified diabetic retinopathy without macular edema: Secondary | ICD-10-CM

## 2021-05-04 DIAGNOSIS — E1159 Type 2 diabetes mellitus with other circulatory complications: Secondary | ICD-10-CM

## 2021-05-04 DIAGNOSIS — I152 Hypertension secondary to endocrine disorders: Secondary | ICD-10-CM | POA: Diagnosis not present

## 2021-05-04 DIAGNOSIS — E785 Hyperlipidemia, unspecified: Secondary | ICD-10-CM

## 2021-05-04 DIAGNOSIS — R590 Localized enlarged lymph nodes: Secondary | ICD-10-CM | POA: Diagnosis not present

## 2021-05-04 DIAGNOSIS — Z794 Long term (current) use of insulin: Secondary | ICD-10-CM | POA: Diagnosis not present

## 2021-05-04 DIAGNOSIS — E538 Deficiency of other specified B group vitamins: Secondary | ICD-10-CM

## 2021-05-04 DIAGNOSIS — E1169 Type 2 diabetes mellitus with other specified complication: Secondary | ICD-10-CM

## 2021-05-04 DIAGNOSIS — E034 Atrophy of thyroid (acquired): Secondary | ICD-10-CM

## 2021-05-04 DIAGNOSIS — H6123 Impacted cerumen, bilateral: Secondary | ICD-10-CM | POA: Diagnosis not present

## 2021-05-04 DIAGNOSIS — Z23 Encounter for immunization: Secondary | ICD-10-CM | POA: Diagnosis not present

## 2021-05-04 MED ORDER — CYANOCOBALAMIN 1000 MCG/ML IJ SOLN
1000.0000 ug | Freq: Once | INTRAMUSCULAR | Status: AC
  Administered 2021-05-04: 1000 ug via INTRAMUSCULAR

## 2021-05-04 NOTE — Patient Instructions (Addendum)
Health Maintenance Due  Topic Date Due  . Prevnar 20 today  Never done  . Zoster Vaccines- Shingrix (1 of 2) Check with local pharmacy. [facility]- you had original in 2009 but the new one is better Never done  . OPHTHALMOLOGY EXAM Sign release form at checkout.  01/02/2021   b12 injection today  Keep dermatology appointment and make sure they look at the spot on your right arm next week  Ear irrigation today on both ears  Recommended follow up: Return in about 4 months (around 09/03/2021) for follow up- or sooner if needed.

## 2021-05-04 NOTE — Addendum Note (Signed)
Addended by: Linton Ham on: 05/04/2021 02:41 PM   Modules accepted: Orders

## 2021-05-04 NOTE — Progress Notes (Addendum)
Phone [phone] In person visit   Subjective:   [name] is a 80 y.o. year old very pleasant male patient who presents for/with See problem oriented charting Chief Complaint  Patient presents with  . Hypertension  . Hyperlipidemia  . Diabetes   This visit occurred during the SARS-CoV-2 public health emergency.  Safety protocols were in place, including screening questions prior to the visit, additional usage of staff PPE, and extensive cleaning of exam room while observing appropriate contact time as indicated for disinfecting solutions.   Past Medical History-  Patient Active Problem List   Diagnosis Date Noted  . Chronic bilateral low back pain without sciatica 12/12/2015    Priority: High  . Chronic lymphocytic leukemia ([place]) 05/16/2014    Priority: High  . History of prostate cancer 01/24/2014    Priority: High  . 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 Diabetic retinopathy ([place]) 09/24/2007    Priority: High  . Tremor 08/11/2015    Priority: Medium  . Hypothyroidism     Priority: Medium  . 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 Insomnia 09/21/2009    Priority: Medium  . Hyperlipidemia associated with type 2 diabetes mellitus ([place]) 09/24/2007    Priority: Medium  . OSA on CPAP 09/24/2007    Priority: Medium  . Hypertension associated with diabetes ([place]) 09/24/2007    Priority: Medium  . Personal history of skin cancer 04/02/2017    Priority: Low  . GERD (gastroesophageal reflux disease) 12/08/2014    Priority: Low  . History of colonic polyps 08/22/2008    Priority: Low  . Gynecomastia 12/28/2020  . Vitamin D deficiency 11/01/2020  . Bell's palsy 05/08/2020  . Senile purpura ([place]) 05/02/2020  . 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 Ear pressure, left 04/14/2017  . Temporomandibular joint (TMJ) pain 04/14/2017  . Pain syndrome, chronic 02/01/2016  . Spondylosis of lumbar  region without myelopathy or radiculopathy 02/01/2016    Medications- reviewed and updated Current Outpatient Medications  Medication Sig Dispense Refill  . amLODipine (NORVASC) 10 MG tablet Take 10 mg by mouth daily.    Marland Kitchen azelastine (ASTELIN) 0.1 % nasal spray Place 1 spray into both nostrils 2 (two) times daily. Use in each nostril as directed 30 mL 12  . Cholecalciferol 50 MCG (2000 UT) TABS Take by mouth.    . fenofibrate 160 MG tablet Take 160 mg by mouth daily.    . fluticasone (FLONASE) 50 MCG/ACT nasal spray Place into both nostrils.    Marland Kitchen glucose 4 GM chewable tablet Chew by mouth.    Marland Kitchen glucose blood test strip Use to test 4 times daily. E11.9 100 each 12  . hydrochlorothiazide (HYDRODIURIL) 25 MG tablet Take 25 mg by mouth daily.    . insulin aspart (NOVOLOG) 100 UNIT/ML injection Inject 30 Units into the skin 3 (three) times daily with meals. When sugar is elevated    . insulin glargine (LANTUS) 100 UNIT/ML injection Inject 65 Units into the skin at bedtime.    . lansoprazole (PREVACID) 30 MG capsule Take 1 capsule (30 mg total) by mouth daily at 12 noon. 30 capsule 5  . levothyroxine (SYNTHROID, LEVOTHROID) 50 MCG tablet Take 50 mcg by mouth daily before breakfast.    . losartan (COZAAR) 100 MG tablet Take 100 mg by mouth daily.    . mirtazapine (REMERON) 15 MG tablet Take 15 mg by mouth at bedtime.    Marland Kitchen  OVER THE COUNTER MEDICATION PreserVision for eyes    . potassium chloride SA (K-DUR,KLOR-CON) 20 MEQ tablet Take 20 mEq by mouth daily.    . Semaglutide (OZEMPIC, 0.25 OR 0.5 MG/DOSE, [place]) Inject 0.5 mg into the skin once a week.    . simvastatin (ZOCOR) 80 MG tablet Take half tablet (40 mg) by mouth once daily    . sucralfate (CARAFATE) 1 G tablet Take 1 g by mouth 4 (four) times daily -  with meals and at bedtime.    . traMADol (ULTRAM) 50 MG tablet Take 50 mg by mouth every 6 (six) hours as needed. for pain  0   No current facility-administered medications for this visit.      Objective:  BP 126/72   Pulse 67   Temp 98.6 F (37 C) (Temporal)   Ht 6\' 1"  (1.854 m)   Wt 252 lb (114.3 kg)   SpO2 97%   BMI 33.25 kg/m  Gen: NAD, resting comfortably HEENT: Bilateral cerumen impaction limited view of TMs on upper portions CV: RRR no murmurs rubs or gallops Chest: Mild increased gynecomastia bilaterally  Lungs: CTAB no crackles, wheeze, rhonchi Abdomen: soft/nontender/nondistended/normal bowel sounds. No rebound or guarding.  Ext: trace edema bilaterally after he removes compression stockings Skin: There is a 8 x6 mm erythematous raised lesion with some subcutaneous mass-had visit with neurology Neuro: grossly normal, moves all extremities, tremor noted worse on the right hand  Diabetic Foot Exam - Simple   Simple Foot Form Diabetic Foot exam was performed with the following findings: Yes 05/04/2021  1:55 PM  Visual Inspection No deformities, no ulcerations, no other skin breakdown bilaterally: Yes Sensation Testing Intact to touch and monofilament testing bilaterally: Yes Pulse Check Posterior Tibialis and Dorsalis pulse intact bilaterally: Yes Comments    Procedure note: Verbal consent obtained- discussed risk of tympanic membrane perforation Obstructive copious Cerumen noted in bilateral ears. This obscures evaluation of tympanic membrane.  Irrigation with water and peroxide performed-additional curetting was performed. Full view of tympanic membrane after procedure but some lingering cerumen noted in lower portion of canal.  Patient tolerated procedure well     Assessment and Plan   # Gynecomastia S:Notcied some increased swelling in both breast area.  Patient recently saw general surgeon who did not recommend surgical treatment A/P: See last note-still did not see clear space for reduction of medication other than potentially simvastatin-offered to reduce this dose and he declines for now  #Immunizations-Original Zostavax and Pneumonia vaccine  completed at the [place]. recommended Shingrix at the [place].  Recommended Prevnar today-patient opts in  #Hypothyroidism S: Compliant with levothyroxine 50 mcg Lab Results  Component Value Date   TSH 1.77 11/27/2020  A/P:  Stable. Continue current medications.   #Hypertension S: Controlled on hydrochlorothiazide 25 mg, losartan 100 mg, amlodipine 10 mg (some edema in past and some at present-wears compression stockings) -propranolol for tremor lowered HR today so he stopped this Home #s: None taken BP Readings from Last 3 Encounters:  05/04/21 126/72  12/28/20 136/74  12/23/20 (!) 150/96  A/P: Stable. Continue current medications.   #B12 deficiency S: history of gastric bypass . have recommended b12 1000 mcg per day- he prefers injections- follows Dr. Grandville Silos.  A/P: Has not had B12 checked recently but since this is water-soluble and patient prefers to continue injections Cassie will simply urinate out the extra-B12 injection today  #Hyperlipidemia S: Controlled on simvastatin 40 mg-half of 80 mg tablet VA also added gemfibrozil with triglycerides  379-currently on fenofibrate Lab Results  Component Value Date   CHOL 105 03/29/2021   HDL 42 03/29/2021   LDLCALC 11 03/29/2021   LDLDIRECT 47.0 11/05/2019   TRIG 258 (A) 03/29/2021   CHOLHDL 2 07/16/2017  A/P: Stable. Continue current medications.  I did discuss potentially reducing simvastatin due to how low LDL is-patient declines at this time-does not want to make any changes   #Diabetes type 2 with ophthalmic complications- lifestyle meterb S: Compliant with Lantus 50 units down from 65 units.  VA. Also uses short acting insulin 30 units before meals. A1c goal 7.5 or less. Diet- Continues to eat bacon, sausage, and other unhealthy foods-He notes that it is difficult for him to change his diet Lab Results  Component Value Date   HGBA1C 6.5 03/29/2021   HGBA1C 6.9 09/18/2020   HGBA1C 7.4 07/10/2020  A/P: Well controlled other than a  few low blood sugars being slightly low due to missing meals-recommended to eat meals/not skip meals.  Continue current medication  # Vitamin D- taking a pill a day 1000 units he thinks  % Patient with known CLL - continues to follow with Dr. Alen Blew.  Stable on last check.  Biopsy 1 January and has follow-up with Dr. Alen Blew in September Lab Results  Component Value Date   WBC 16.3 (H) 12/23/2020   HGB 15.0 12/23/2020   HCT 44.8 12/23/2020   MCV 85.5 12/23/2020   PLT 218 12/23/2020   Recommended follow up: Return in about 4 months (around 09/03/2021) for follow up- or sooner if needed. Future Appointments  Date Time Provider [place]  05/07/2021  2:00 PM [name], MD CD-GSO CDGSO  05/31/2021  9:45 AM [facility] NURSE [facility] PEC  06/11/2021  7:30 AM Hayden Pedro, MD TRE-TRE None  08/08/2021  9:30 AM [facility] LAB [facility] None  08/08/2021 10:00 AM Wyatt Portela, MD [facility] None  09/11/2021  9:15 AM [name], MD [facility] CD[facility]SO  12/05/2021  1:30 PM [facility] CCM PHARMACIST [facility] PEC    Lab/Order associations:   ICD-10-CM   1. Type 2 diabetes mellitus with retinopathy of both eyes, with long-term current use of insulin, macular edema presence unspecified, unspecified retinopathy severity ([place])  E11.319    Z79.4   2. Localized enlarged lymph nodes  R59.0 NM PET Image Initial (PI) Skull Base To Thigh    CANCELED: NM PET Image Initial (PI) Skull Base To Thigh  3. Hypertension associated with diabetes ([place])  E11.59    I15.2   4. Hyperlipidemia associated with type 2 diabetes mellitus ([place])  E11.69    E78.5   5. Hypothyroidism due to acquired atrophy of thyroid  E03.4    I,Jordan Kelly,acting as a scribe for [name], MD.,have documented all relevant documentation on the behalf of [name], MD,as directed by  [name], MD while in the presence of [name], MD.   I, [name], MD, have reviewed all documentation for this visit. The  documentation on 05/04/21 for the exam, diagnosis, procedures, and orders are all accurate and complete.   Return precautions advised.  [name], MD

## 2021-05-07 ENCOUNTER — Other Ambulatory Visit: Payer: Self-pay

## 2021-05-07 ENCOUNTER — Ambulatory Visit (INDEPENDENT_AMBULATORY_CARE_PROVIDER_SITE_OTHER): Payer: Medicare Other | Admitting: Dermatology

## 2021-05-07 ENCOUNTER — Encounter: Payer: Self-pay | Admitting: Dermatology

## 2021-05-07 DIAGNOSIS — Z1283 Encounter for screening for malignant neoplasm of skin: Secondary | ICD-10-CM

## 2021-05-07 DIAGNOSIS — D485 Neoplasm of uncertain behavior of skin: Secondary | ICD-10-CM

## 2021-05-07 DIAGNOSIS — L309 Dermatitis, unspecified: Secondary | ICD-10-CM | POA: Diagnosis not present

## 2021-05-07 DIAGNOSIS — C4A61 Merkel cell carcinoma of right upper limb, including shoulder: Secondary | ICD-10-CM

## 2021-05-07 NOTE — Patient Instructions (Signed)

## 2021-05-11 ENCOUNTER — Encounter: Payer: Self-pay | Admitting: Dermatology

## 2021-05-12 NOTE — Progress Notes (Signed)
   Follow-Up Visit   Subjective  [name] is a 80 y.o. male who presents for the following: Skin Problem (Patient here today for knot on his right forearm x 1 month getting larger. Per patient no pain, no drainage. ).  New lesion right arm for the past month, growing Location:  Duration:  Quality:  Associated Signs/Symptoms: Modifying Factors:  Severity:  Timing: Context:   Objective  Well appearing patient in no apparent distress; mood and affect are within normal limits. Right Forearm - Posterior This is a somewhat unusual smooth domed 1+ centimeter easily palpable dermal nodule; dermoscopy shows an amorphous lesion with irregular vessels.  No regional adenopathy.  I am concerned about uncommon entities such as Merkel cell carcinoma.       Mid Back Wife present throughout visit.  In addition to the lesion on his right arm I checked his back in the sun exposed areas from the waist up.  Multiple seborrheic keratoses particularly on the back.  Mostly small actinic keratoses on the arms and face.  No recurrent skin cancer.  Scalp Brief follow-up on his next dermatitis plus diffuse sun damage scalp; he uses clobetasol on a as needed basis and most of the time this is under control.    A focused examination was performed including right arm, head.. Relevant physical exam findings are noted in the Assessment and Plan.   Assessment & Plan    Neoplasm of uncertain behavior of skin Right Forearm - Posterior  Skin / nail biopsy Type of biopsy: tangential   Informed consent: discussed and consent obtained   Timeout: patient name, date of birth, surgical site, and procedure verified   Procedure prep:  Patient was prepped and draped in usual sterile fashion (Non sterile) Prep type:  Chlorhexidine Anesthesia: the lesion was anesthetized in a standard fashion   Anesthetic:  1% lidocaine w/ epinephrine 1-100,000 local infiltration Instrument used: flexible razor blade    Outcome: patient tolerated procedure well   Post-procedure details: wound care instructions given    Specimen 1 - Surgical pathology Differential Diagnosis: r/o atypia  Check Margins: No  Deep shave biopsy showed that this was a solid nodule, slightly glistening pink-gray.  We will await biopsy result.  Encounter for screening for malignant neoplasm of skin Mid Back  Annual skin examination.  Dermatitis Scalp  Continue clobetasol, reminded not to use this on the face.      I, [name], MD, have reviewed all documentation for this visit.  The documentation on 05/12/21 for the exam, diagnosis, procedures, and orders are all accurate and complete.

## 2021-05-16 ENCOUNTER — Telehealth: Payer: Self-pay | Admitting: Dermatology

## 2021-05-16 NOTE — Telephone Encounter (Signed)
Patient's wife left message on office voice mail returning phone call about patient's pathology results.

## 2021-05-16 NOTE — Telephone Encounter (Signed)
Patient's wife is calling for pathology results from last visit with Stuart Tafeen, M.D. 

## 2021-05-16 NOTE — Telephone Encounter (Signed)
Left message for patient to return our phone call.

## 2021-05-17 ENCOUNTER — Telehealth: Payer: Self-pay | Admitting: *Deleted

## 2021-05-17 ENCOUNTER — Telehealth (INDEPENDENT_AMBULATORY_CARE_PROVIDER_SITE_OTHER): Payer: Medicare Other | Admitting: Dermatology

## 2021-05-17 NOTE — Telephone Encounter (Signed)
Phone call to [facility] surgery to get patient scheduled with Dr. Barry Dienes. Will fax over office notes and the pathology report. Left patients wife a message to let her know the consult date is 05/21/21 @1 :4.

## 2021-05-17 NOTE — Telephone Encounter (Signed)
Phone call to [facility] surgery to get patient scheduled with Dr.Byerly to have forearm treated. Will fax over the office notes and pathology. Left patient a message with consult date. 05/21/21 @1 :45.

## 2021-05-17 NOTE — Telephone Encounter (Signed)
Wife called, annoyed they weren't called back yet for [place]'s results

## 2021-05-17 NOTE — Telephone Encounter (Signed)
12-minute conversation with [name].  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  We will try and contact Dr. Marlowe Aschoff office when it opens later today and arrange for initial consultation.  This will almost certainly be followed by scheduling wide local excision and sentinel lymph node biopsy.  I told [name] that there is a possibility that he may after surgery be seen by radiation oncologist and likely will consult with a medical oncologist as well.  All of her questions were answered and she has my cell number to contact me with any questions.  If we have not made arrangements with an week, I have asked her to call me back to make sure this gets done as soon as possible.

## 2021-05-19 ENCOUNTER — Encounter: Payer: Self-pay | Admitting: Oncology

## 2021-05-19 ENCOUNTER — Encounter: Payer: Self-pay | Admitting: Family Medicine

## 2021-05-21 ENCOUNTER — Other Ambulatory Visit: Payer: Self-pay | Admitting: General Surgery

## 2021-05-21 DIAGNOSIS — G4733 Obstructive sleep apnea (adult) (pediatric): Secondary | ICD-10-CM | POA: Diagnosis not present

## 2021-05-21 DIAGNOSIS — C4A61 Merkel cell carcinoma of right upper limb, including shoulder: Secondary | ICD-10-CM

## 2021-05-21 DIAGNOSIS — G250 Essential tremor: Secondary | ICD-10-CM | POA: Diagnosis not present

## 2021-05-21 DIAGNOSIS — C9110 Chronic lymphocytic leukemia of B-cell type not having achieved remission: Secondary | ICD-10-CM | POA: Diagnosis not present

## 2021-05-23 ENCOUNTER — Other Ambulatory Visit: Payer: Self-pay

## 2021-05-23 ENCOUNTER — Encounter (HOSPITAL_BASED_OUTPATIENT_CLINIC_OR_DEPARTMENT_OTHER): Payer: Self-pay | Admitting: General Surgery

## 2021-05-25 ENCOUNTER — Telehealth: Payer: Self-pay

## 2021-05-25 NOTE — Chronic Care Management (AMB) (Signed)
Chronic Care Management Pharmacy Assistant   Name: [name]  MRN: [id number] DOB: Mar 27, 1941  Reason for Encounter: Diabetes Mellitus Disease State Call  Recent office visits:  05/04/21- [name], MD- chronic conditions addressed, Prevnar administered, no medication changes, follow up 4 months   Recent consult visits:  05/17/21- Telephone Encounter (Dermatology)- [name], MD- Biopsy results showed Merkel cell carcinoma. Initial consultation arranged with [facility]  05/07/21- [name], MD (Dermatology)- seen for screening for malignant neoplasm of skin, Biopsy procedure   Hospital visits:  None in previous 6 months  Medications: Outpatient Encounter Medications as of 05/25/2021  Medication Sig   amLODipine (NORVASC) 10 MG tablet Take 10 mg by mouth daily.   azelastine (ASTELIN) 0.1 % nasal spray Place 1 spray into both nostrils 2 (two) times daily. Use in each nostril as directed   Cholecalciferol 50 MCG (2000 UT) TABS Take by mouth.   fluticasone (FLONASE) 50 MCG/ACT nasal spray Place into both nostrils.   glucose 4 GM chewable tablet Chew by mouth.   glucose blood test strip Use to test 4 times daily. E11.9   hydrochlorothiazide (HYDRODIURIL) 25 MG tablet Take 25 mg by mouth daily.   insulin aspart (NOVOLOG) 100 UNIT/ML injection Inject 30 Units into the skin 3 (three) times daily with meals. When sugar is elevated   insulin glargine (LANTUS) 100 UNIT/ML injection Inject 50 Units into the skin at bedtime.   lansoprazole (PREVACID) 30 MG capsule Take 1 capsule (30 mg total) by mouth daily at 12 noon.   levothyroxine (SYNTHROID, LEVOTHROID) 50 MCG tablet Take 50 mcg by mouth daily before breakfast.   losartan (COZAAR) 100 MG tablet Take 100 mg by mouth daily.   mirtazapine (REMERON) 15 MG tablet Take 15 mg by mouth at bedtime.   OVER THE COUNTER MEDICATION PreserVision for eyes   potassium chloride SA (K-DUR,KLOR-CON) 20 MEQ tablet Take 20  mEq by mouth daily.   Semaglutide (OZEMPIC, 0.25 OR 0.5 MG/DOSE, [place]) Inject 0.5 mg into the skin once a week.   simvastatin (ZOCOR) 80 MG tablet Take half tablet (40 mg) by mouth once daily   sucralfate (CARAFATE) 1 G tablet Take 1 g by mouth 4 (four) times daily -  with meals and at bedtime.   traMADol (ULTRAM) 50 MG tablet Take 50 mg by mouth every 6 (six) hours as needed. for pain   No facility-administered encounter medications on file as of 05/25/2021.   Recent Relevant Labs: Lab Results  Component Value Date/Time   HGBA1C 6.5 03/29/2021 12:00 AM   HGBA1C 6.9 09/18/2020 12:00 AM   MICROALBUR 0.681 03/29/2021 12:00 AM   MICROALBUR 2.58 02/27/2016 12:00 AM    Kidney Function Lab Results  Component Value Date/Time   CREATININE 1.00 12/23/2020 09:36 AM   CREATININE 1.2 09/18/2020 12:00 AM   CREATININE 0.85 05/02/2020 01:23 PM   CREATININE 1.08 08/26/2019 09:08 AM   CREATININE 1.2 10/30/2017 08:45 AM   CREATININE 1.0 04/29/2017 09:09 AM   GFR 87.47 05/02/2020 01:23 PM   GFRNONAA >60 12/23/2020 09:36 AM   GFRNONAA >60 08/26/2019 09:08 AM   GFRAA >60 08/26/2019 09:08 AM   Several unsuccessful attempts made to contact patient.  Current location 06/30: [place]  Current antihyperglycemic regimen:   insulin aspart (NOVOLOG) 100 UNIT/ML injection- 30 units tid insulin glargine (LANTUS) 100 UNIT/ML injection- 50 units at bedtime  What recent interventions/DTPs have been made to improve glycemic control:   Have there been  any recent hospitalizations or ED visits since last visit with [facility]?   How often are you checking your blood sugar?   What are your blood sugars ranging?  Fasting:  Before meals:  After meals:  Bedtime:   During the week, how often does your blood glucose drop below 70?   Are you checking your feet daily/regularly?   Adherence Review: Is the patient currently on a STATIN medication?  Is the patient currently on ACE/ARB medication?    Does the patient have >5 day gap between last estimated fill dates? Patient receives prescriptions through [facility]  insulin aspart (NOVOLOG) 100 UNIT/ML injection insulin glargine (LANTUS) 100 UNIT/ML injection   Star Rating Drugs: Patient receives prescriptions through [facility] Simvastatin 80 mg Losartan 100 mg  [employer], CMA

## 2021-05-29 ENCOUNTER — Other Ambulatory Visit: Payer: Self-pay

## 2021-05-29 ENCOUNTER — Ambulatory Visit (INDEPENDENT_AMBULATORY_CARE_PROVIDER_SITE_OTHER): Payer: Medicare Other

## 2021-05-29 ENCOUNTER — Encounter (HOSPITAL_BASED_OUTPATIENT_CLINIC_OR_DEPARTMENT_OTHER)
Admission: RE | Admit: 2021-05-29 | Discharge: 2021-05-29 | Disposition: A | Discharge status: Home / Self Care | Payer: Medicare Other | Source: Ambulatory Visit | Attending: General Surgery | Admitting: General Surgery

## 2021-05-29 DIAGNOSIS — E119 Type 2 diabetes mellitus without complications: Secondary | ICD-10-CM | POA: Diagnosis not present

## 2021-05-29 DIAGNOSIS — E538 Deficiency of other specified B group vitamins: Secondary | ICD-10-CM | POA: Diagnosis not present

## 2021-05-29 DIAGNOSIS — C4A61 Merkel cell carcinoma of right upper limb, including shoulder: Secondary | ICD-10-CM | POA: Diagnosis not present

## 2021-05-29 DIAGNOSIS — Z01810 Encounter for preprocedural cardiovascular examination: Secondary | ICD-10-CM | POA: Insufficient documentation

## 2021-05-29 DIAGNOSIS — G250 Essential tremor: Secondary | ICD-10-CM | POA: Diagnosis not present

## 2021-05-29 DIAGNOSIS — G4733 Obstructive sleep apnea (adult) (pediatric): Secondary | ICD-10-CM | POA: Diagnosis not present

## 2021-05-29 DIAGNOSIS — Z8546 Personal history of malignant neoplasm of prostate: Secondary | ICD-10-CM | POA: Diagnosis not present

## 2021-05-29 DIAGNOSIS — C9110 Chronic lymphocytic leukemia of B-cell type not having achieved remission: Secondary | ICD-10-CM | POA: Diagnosis not present

## 2021-05-29 DIAGNOSIS — D2361 Other benign neoplasm of skin of right upper limb, including shoulder: Secondary | ICD-10-CM | POA: Diagnosis not present

## 2021-05-29 DIAGNOSIS — I10 Essential (primary) hypertension: Secondary | ICD-10-CM | POA: Insufficient documentation

## 2021-05-29 DIAGNOSIS — Z79899 Other long term (current) drug therapy: Secondary | ICD-10-CM | POA: Diagnosis not present

## 2021-05-29 DIAGNOSIS — Z87891 Personal history of nicotine dependence: Secondary | ICD-10-CM | POA: Diagnosis not present

## 2021-05-29 LAB — CBC WITH DIFFERENTIAL/PLATELET
Abs Immature Granulocytes: 0.06 10*3/uL (ref 0.00–0.07)
Basophils Absolute: 0.1 10*3/uL (ref 0.0–0.1)
Basophils Relative: 1 %
Eosinophils Absolute: 0.3 10*3/uL (ref 0.0–0.5)
Eosinophils Relative: 1 %
HCT: 46.3 % (ref 39.0–52.0)
Hemoglobin: 15.8 g/dL (ref 13.0–17.0)
Immature Granulocytes: 0 %
Lymphocytes Relative: 49 %
Lymphs Abs: 10.6 10*3/uL — ABNORMAL HIGH (ref 0.7–4.0)
MCH: 28.8 pg (ref 26.0–34.0)
MCHC: 34.1 g/dL (ref 30.0–36.0)
MCV: 84.3 fL (ref 80.0–100.0)
Monocytes Absolute: 3.7 10*3/uL — ABNORMAL HIGH (ref 0.1–1.0)
Monocytes Relative: 17 %
Neutro Abs: 6.9 10*3/uL (ref 1.7–7.7)
Neutrophils Relative %: 32 %
Platelets: 225 10*3/uL (ref 150–400)
RBC: 5.49 MIL/uL (ref 4.22–5.81)
RDW: 14.3 % (ref 11.5–15.5)
WBC: 21.6 10*3/uL — ABNORMAL HIGH (ref 4.0–10.5)
nRBC: 0 % (ref 0.0–0.2)

## 2021-05-29 LAB — COMPREHENSIVE METABOLIC PANEL
ALT: 34 U/L (ref 0–44)
AST: 31 U/L (ref 15–41)
Albumin: 4.1 g/dL (ref 3.5–5.0)
Alkaline Phosphatase: 47 U/L (ref 38–126)
Anion gap: 9 (ref 5–15)
BUN: 16 mg/dL (ref 8–23)
CO2: 29 mmol/L (ref 22–32)
Calcium: 9.3 mg/dL (ref 8.9–10.3)
Chloride: 103 mmol/L (ref 98–111)
Creatinine, Ser: 1.06 mg/dL (ref 0.61–1.24)
GFR, Estimated: >60 mL/min (ref >60)
Glucose, Bld: 85 mg/dL (ref 70–99)
Potassium: 4 mmol/L (ref 3.5–5.1)
Sodium: 141 mmol/L (ref 135–145)
Total Bilirubin: 1.9 mg/dL — ABNORMAL HIGH (ref 0.3–1.2)
Total Protein: 6.6 g/dL (ref 6.5–8.1)

## 2021-05-29 LAB — PROTIME-INR
INR: 1.1 (ref 0.8–1.2)
Prothrombin Time: 13.9 seconds (ref 11.4–15.2)

## 2021-05-29 MED ORDER — CYANOCOBALAMIN 1000 MCG/ML IJ SOLN
1000.0000 ug | Freq: Once | INTRAMUSCULAR | Status: AC
  Administered 2021-05-29: 1000 ug via INTRAMUSCULAR

## 2021-05-29 NOTE — H&P (Signed)
[name] Appointment: 05/21/2021 1:45 PM Location: [facility] Patient #: [id number] DOB: November 25, 1941 Married / Language: English / Race: White Male   History of Present Illness David Klein MD; 05/21/2021 2:24 PM) The patient is a 80 year old male who presents with a diagnosis of cancer. Pt is a 80 yo M who has seen Dr. Hassell Done before for lap band and gynecomastia.  He is referred back by Dr. 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  He had a lesion there that was growing and changing.  Dr. Denna Haggard did a biopsy and it showed merkel cell carcinoma with positive margins.  Pt has had a dx of CLL before in 2015.  he sees Dr. Alen Blew for this. He did not require treatment.  He also has had prostate cancer.  He has macular degeneration which has been limiting for him.  He also has a pronounced RUE tremor.    path is from [facility] labs, accession # [id number]   Allergies [name], [place]; 05/21/2021 1:46 PM) No Known Allergies   [04/06/2021]: Allergies Reconciled    Medication History [name], CMA; 05/21/2021 1:47 PM) Clobetasol Propionate  (0.05% Solution, External) Active. Norvasc  (10MG  Tablet, Oral) Active. traMADol HCl  (50MG  Tablet, Oral) Active. Simvastatin  (40MG  Tablet, Oral) Active. Azelastine HCl  (0.1% Solution, Nasal) Active. Glucose  (4GM Tablet Chewable, Oral) Active. hydroCHLOROthiazide  (25MG  Tablet, Oral) Active. Losartan Potassium  (100MG  Tablet, Oral) Active. Potassium Chloride Crys ER  (20MEQ Tablet ER, Oral) Active. Vitamin D  (50 MCG(2000 UT) Tablet, Oral) Active. Medications Reconciled     Review of Systems David Klein MD; 05/21/2021 2:25 PM) All other systems negative  Vitals [name] CMA; 05/21/2021 1:46 PM) 05/21/2021 1:46 PM Weight: 243.8 lb   Height: 73 in  Body Surface Area: 2.34 m   Body Mass Index: 32.17 kg/m   Temp.: 98.4 F    Pulse: 107 (Regular)    BP: 144/82(Sitting, Left Arm,  Standard)       Physical Exam David Klein MD; 05/21/2021 2:26 PM) General Mental Status - Alert. General Appearance - Consistent with stated age. Hydration - Well hydrated. Voice - Normal.  Integumentary Note:  right forearm has a biopsy site with heaped up edges and still raw in the middle. His skin is a bit crepe like and thin. He does have good mobilization of skin in this area, however. He had no other lesions seen. He is relatively tan on his arms wtih chronic sun damage.   Head and Neck Head - normocephalic, atraumatic with no lesions or palpable masses.  Eye Sclera/Conjunctiva - Bilateral - No scleral icterus.  Chest and Lung Exam Chest and lung exam reveals  - quiet, even and easy respiratory effort with no use of accessory muscles. Inspection Chest Wall - Normal. Back - normal.  Breast - Did not examine.  Cardiovascular Cardiovascular examination reveals  - normal pedal pulses bilaterally. Note:  regular rate and rhythm  Abdomen Inspection - Inspection Normal. Palpation/Percussion Palpation and Percussion of the abdomen reveal - Soft, Non Tender, No Rebound tenderness, No Rigidity (guarding) and No hepatosplenomegaly.  Peripheral Vascular Upper Extremity Inspection - Bilateral - Normal - No Clubbing, No Cyanosis, No Edema, Pulses Intact. Lower Extremity Palpation - Edema - Bilateral - No edema - Bilateral.  Neurologic Neurologic evaluation reveals  - alert and oriented x 3 with no impairment of recent or remote memory. Mental Status - Normal.  Musculoskeletal Global Assessment  -  Note:  no gross deformities.  Normal Exam - Left - Upper Extremity Strength Normal and Lower Extremity Strength Normal. Normal Exam - Right - Upper Extremity Strength Normal and Lower Extremity Strength Normal.  Lymphatic Head & Neck  General Head & Neck Lymphatics: Bilateral - Description - Normal. Axillary  General Axillary Region: Bilateral - Description - Normal.  Tenderness - Non Tender.    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  I will let Dr. Alen Blew know.  If his node is positive, he would likely need a PET.  I reviewed surgery and risks. I discussed risk of seroma and wound breakdown/bleeding primarily. I discussed risk of heart or lung issues. He wishes to proceed. Current Plans Pt Education - [facility] General Post-op HCI You are being scheduled for surgery - Our schedulers will call you.   You should hear from our office's scheduling department within 5 working days about the location, date, and time of surgery.  We try to make accommodations for patient's preferences in scheduling surgery, but sometimes the OR schedule or the surgeon's schedule prevents [place] from making those accommodations.  If you have not heard from our office [phone]) in 5 working days, call the office and ask for your surgeon's nurse.  If you have other questions about your diagnosis, plan, or surgery, call the office and ask for your surgeon's nurse.  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 OBSTRUCTIVE SLEEP APNEA (G47.33) Impression: Will need to continue CPAP especially on POD 0-1. 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    Signed electronically by David Klein, MD (05/21/2021 2:29 PM)

## 2021-05-29 NOTE — Progress Notes (Signed)

## 2021-05-29 NOTE — Progress Notes (Signed)
After obtaining consent, and per orders of Dr. Yong Channel, injection of Vitamin B12  given by Loralyn Freshwater. Patient tolerated injection well.

## 2021-05-31 ENCOUNTER — Encounter (HOSPITAL_COMMUNITY)
Admission: RE | Admit: 2021-05-31 | Discharge: 2021-05-31 | Disposition: A | Discharge status: Home / Self Care | Payer: Medicare Other | Source: Ambulatory Visit | Attending: General Surgery | Admitting: General Surgery

## 2021-05-31 ENCOUNTER — Ambulatory Visit: Payer: Medicare Other

## 2021-05-31 ENCOUNTER — Encounter (HOSPITAL_BASED_OUTPATIENT_CLINIC_OR_DEPARTMENT_OTHER): Payer: Self-pay | Admitting: General Surgery

## 2021-05-31 ENCOUNTER — Other Ambulatory Visit: Payer: Self-pay

## 2021-05-31 ENCOUNTER — Encounter (HOSPITAL_BASED_OUTPATIENT_CLINIC_OR_DEPARTMENT_OTHER)
Admission: RE | Disposition: A | Discharge status: Home / Self Care | Payer: Self-pay | Source: Home / Self Care | Attending: General Surgery

## 2021-05-31 ENCOUNTER — Ambulatory Visit (HOSPITAL_BASED_OUTPATIENT_CLINIC_OR_DEPARTMENT_OTHER): Payer: Medicare Other | Admitting: Anesthesiology

## 2021-05-31 ENCOUNTER — Ambulatory Visit (HOSPITAL_BASED_OUTPATIENT_CLINIC_OR_DEPARTMENT_OTHER)
Admission: RE | Admit: 2021-05-31 | Discharge: 2021-05-31 | Disposition: A | Discharge status: Home / Self Care | Payer: Medicare Other | Attending: General Surgery | Admitting: General Surgery

## 2021-05-31 ENCOUNTER — Ambulatory Visit (HOSPITAL_COMMUNITY): Payer: Medicare Other

## 2021-05-31 DIAGNOSIS — Z87891 Personal history of nicotine dependence: Secondary | ICD-10-CM | POA: Insufficient documentation

## 2021-05-31 DIAGNOSIS — D2361 Other benign neoplasm of skin of right upper limb, including shoulder: Secondary | ICD-10-CM | POA: Insufficient documentation

## 2021-05-31 DIAGNOSIS — Z79899 Other long term (current) drug therapy: Secondary | ICD-10-CM | POA: Insufficient documentation

## 2021-05-31 DIAGNOSIS — C9110 Chronic lymphocytic leukemia of B-cell type not having achieved remission: Secondary | ICD-10-CM | POA: Diagnosis not present

## 2021-05-31 DIAGNOSIS — C4A61 Merkel cell carcinoma of right upper limb, including shoulder: Secondary | ICD-10-CM | POA: Diagnosis not present

## 2021-05-31 DIAGNOSIS — G250 Essential tremor: Secondary | ICD-10-CM | POA: Insufficient documentation

## 2021-05-31 DIAGNOSIS — E039 Hypothyroidism, unspecified: Secondary | ICD-10-CM | POA: Diagnosis not present

## 2021-05-31 DIAGNOSIS — Z8546 Personal history of malignant neoplasm of prostate: Secondary | ICD-10-CM | POA: Insufficient documentation

## 2021-05-31 DIAGNOSIS — G4733 Obstructive sleep apnea (adult) (pediatric): Secondary | ICD-10-CM | POA: Insufficient documentation

## 2021-05-31 DIAGNOSIS — G510 Bell's palsy: Secondary | ICD-10-CM | POA: Diagnosis not present

## 2021-05-31 DIAGNOSIS — E559 Vitamin D deficiency, unspecified: Secondary | ICD-10-CM | POA: Diagnosis not present

## 2021-05-31 HISTORY — DX: Merkel cell carcinoma, unspecified: C4A.9

## 2021-05-31 HISTORY — DX: Tremor, unspecified: R25.1

## 2021-05-31 HISTORY — PX: EXCISION MELANOMA WITH SENTINEL LYMPH NODE BIOPSY: SHX5628

## 2021-05-31 LAB — PATHOLOGIST SMEAR REVIEW

## 2021-05-31 LAB — GLUCOSE, CAPILLARY
Glucose-Capillary: 125 mg/dL — ABNORMAL HIGH (ref 70–99)
Glucose-Capillary: 129 mg/dL — ABNORMAL HIGH (ref 70–99)

## 2021-05-31 SURGERY — EXCISION, MELANOMA, WITH SENTINEL LYMPH NODE BIOPSY
Anesthesia: General | Laterality: Right

## 2021-05-31 MED ORDER — SODIUM CHLORIDE (PF) 0.9 % IJ SOLN
INTRAMUSCULAR | Status: AC
  Filled 2021-05-31: qty 10

## 2021-05-31 MED ORDER — LIDOCAINE-EPINEPHRINE 1 %-1:100000 IJ SOLN
INTRAMUSCULAR | Status: DC | PRN
  Administered 2021-05-31: 20 mL

## 2021-05-31 MED ORDER — TECHNETIUM TC 99M TILMANOCEPT KIT
0.5000 | PACK | Freq: Once | INTRAVENOUS | Status: AC | PRN
  Administered 2021-05-31: 0.5 via INTRADERMAL

## 2021-05-31 MED ORDER — TRAMADOL HCL 50 MG PO TABS: 50.0000 mg | ORAL_TABLET | Freq: Four times a day (QID) | ORAL | 0 refills | Status: AC | PRN

## 2021-05-31 MED ORDER — CEFAZOLIN SODIUM-DEXTROSE 2-4 GM/100ML-% IV SOLN
INTRAVENOUS | Status: AC
  Filled 2021-05-31: qty 100

## 2021-05-31 MED ORDER — BUPIVACAINE HCL (PF) 0.25 % IJ SOLN
INTRAMUSCULAR | Status: DC | PRN
  Administered 2021-05-31: 30 mL

## 2021-05-31 MED ORDER — CHLORHEXIDINE GLUCONATE CLOTH 2 % EX PADS: 6.0000 | MEDICATED_PAD | Freq: Once | CUTANEOUS | Status: DC

## 2021-05-31 MED ORDER — OXYCODONE HCL 5 MG/5ML PO SOLN: 5.0000 mg | Freq: Once | ORAL | Status: DC | PRN

## 2021-05-31 MED ORDER — SUGAMMADEX SODIUM 500 MG/5ML IV SOLN
INTRAVENOUS | Status: AC
  Filled 2021-05-31: qty 5

## 2021-05-31 MED ORDER — CEFAZOLIN SODIUM-DEXTROSE 2-4 GM/100ML-% IV SOLN
2.0000 g | INTRAVENOUS | Status: AC
  Administered 2021-05-31: 2 g via INTRAVENOUS

## 2021-05-31 MED ORDER — OXYCODONE HCL 5 MG PO TABS: 5.0000 mg | ORAL_TABLET | Freq: Once | ORAL | Status: DC | PRN

## 2021-05-31 MED ORDER — EPHEDRINE SULFATE 50 MG/ML IJ SOLN
INTRAMUSCULAR | Status: DC | PRN
  Administered 2021-05-31 (×2): 10 mg via INTRAVENOUS

## 2021-05-31 MED ORDER — BUPIVACAINE HCL (PF) 0.25 % IJ SOLN
INTRAMUSCULAR | Status: AC
  Filled 2021-05-31: qty 30

## 2021-05-31 MED ORDER — METHYLENE BLUE 0.5 % INJ SOLN
INTRAVENOUS | Status: DC | PRN
  Administered 2021-05-31: 1 mL via SUBMUCOSAL

## 2021-05-31 MED ORDER — ACETAMINOPHEN 500 MG PO TABS
1000.0000 mg | ORAL_TABLET | ORAL | Status: AC
  Administered 2021-05-31: 1000 mg via ORAL

## 2021-05-31 MED ORDER — AMISULPRIDE (ANTIEMETIC) 5 MG/2ML IV SOLN: 10.0000 mg | Freq: Once | INTRAVENOUS | Status: DC | PRN

## 2021-05-31 MED ORDER — FENTANYL CITRATE (PF) 100 MCG/2ML IJ SOLN
INTRAMUSCULAR | Status: AC
  Filled 2021-05-31: qty 2

## 2021-05-31 MED ORDER — METHYLENE BLUE 0.5 % INJ SOLN
INTRAVENOUS | Status: AC
  Filled 2021-05-31: qty 10

## 2021-05-31 MED ORDER — HYDROMORPHONE HCL 1 MG/ML IJ SOLN: 0.2500 mg | INTRAMUSCULAR | Status: DC | PRN

## 2021-05-31 MED ORDER — PROPOFOL 10 MG/ML IV BOLUS
INTRAVENOUS | Status: DC | PRN
  Administered 2021-05-31: 200 mg via INTRAVENOUS

## 2021-05-31 MED ORDER — LACTATED RINGERS IV SOLN: INTRAVENOUS | Status: DC

## 2021-05-31 MED ORDER — DEXAMETHASONE SODIUM PHOSPHATE 4 MG/ML IJ SOLN
INTRAMUSCULAR | Status: DC | PRN
  Administered 2021-05-31: 4 mg via INTRAVENOUS

## 2021-05-31 MED ORDER — FENTANYL CITRATE (PF) 100 MCG/2ML IJ SOLN
INTRAMUSCULAR | Status: DC | PRN
  Administered 2021-05-31 (×2): 50 ug via INTRAVENOUS

## 2021-05-31 MED ORDER — PROMETHAZINE HCL 25 MG/ML IJ SOLN: 6.2500 mg | INTRAMUSCULAR | Status: DC | PRN

## 2021-05-31 MED ORDER — LIDOCAINE HCL (CARDIAC) PF 100 MG/5ML IV SOSY
PREFILLED_SYRINGE | INTRAVENOUS | Status: DC | PRN
  Administered 2021-05-31: 60 mg via INTRAVENOUS

## 2021-05-31 MED ORDER — LIDOCAINE-EPINEPHRINE 1 %-1:100000 IJ SOLN
INTRAMUSCULAR | Status: AC
  Filled 2021-05-31: qty 1

## 2021-05-31 MED ORDER — ONDANSETRON HCL 4 MG/2ML IJ SOLN
INTRAMUSCULAR | Status: DC | PRN
  Administered 2021-05-31: 4 mg via INTRAVENOUS

## 2021-05-31 MED ORDER — ACETAMINOPHEN 500 MG PO TABS
ORAL_TABLET | ORAL | Status: AC
  Filled 2021-05-31: qty 2

## 2021-05-31 SURGICAL SUPPLY — 69 items
ADH SKN CLS APL DERMABOND .7 (GAUZE/BANDAGES/DRESSINGS) ×1
APL PRP STRL LF DISP 70% ISPRP (MISCELLANEOUS) ×2
APL SKNCLS STERI-STRIP NONHPOA (GAUZE/BANDAGES/DRESSINGS) ×1
APPLIER CLIP 11 MED OPEN (CLIP)
APPLIER CLIP 9.375 MED OPEN (MISCELLANEOUS)
APR CLP MED 11 20 MLT OPN (CLIP)
APR CLP MED 9.3 20 MLT OPN (MISCELLANEOUS)
BENZOIN TINCTURE PRP APPL 2/3 (GAUZE/BANDAGES/DRESSINGS) ×2 IMPLANT
BLADE CLIPPER SURG (BLADE) IMPLANT
BLADE HEX COATED 2.75 (ELECTRODE) ×2 IMPLANT
BLADE SURG 10 STRL SS (BLADE) ×2 IMPLANT
BLADE SURG 15 STRL LF DISP TIS (BLADE) ×1 IMPLANT
BLADE SURG 15 STRL SS (BLADE) ×2
BNDG COHESIVE 4X5 TAN STRL (GAUZE/BANDAGES/DRESSINGS) IMPLANT
BNDG GAUZE ELAST 4 BULKY (GAUZE/BANDAGES/DRESSINGS) IMPLANT
CANISTER SUCT 1200ML W/VALVE (MISCELLANEOUS) IMPLANT
CHLORAPREP W/TINT 26 (MISCELLANEOUS) ×4 IMPLANT
CLIP APPLIE 11 MED OPEN (CLIP) IMPLANT
CLIP APPLIE 9.375 MED OPEN (MISCELLANEOUS) IMPLANT
CLIP VESOCCLUDE LG 6/CT (CLIP) IMPLANT
CLIP VESOCCLUDE MED 6/CT (CLIP) ×2 IMPLANT
CLIP VESOCCLUDE SM WIDE 6/CT (CLIP) ×2 IMPLANT
COVER MAYO STAND STRL (DRAPES) IMPLANT
COVER PROBE W GEL 5X96 (DRAPES) ×2 IMPLANT
DECANTER SPIKE VIAL GLASS SM (MISCELLANEOUS) IMPLANT
DERMABOND ADVANCED (GAUZE/BANDAGES/DRESSINGS) ×1
DERMABOND ADVANCED .7 DNX12 (GAUZE/BANDAGES/DRESSINGS) ×1 IMPLANT
DRAPE UTILITY XL STRL (DRAPES) ×4 IMPLANT
DRSG PAD ABDOMINAL 8X10 ST (GAUZE/BANDAGES/DRESSINGS) IMPLANT
DRSG TEGADERM 4X10 (GAUZE/BANDAGES/DRESSINGS) IMPLANT
DRSG TEGADERM 4X4.75 (GAUZE/BANDAGES/DRESSINGS) IMPLANT
DRSG TELFA 3X8 NADH (GAUZE/BANDAGES/DRESSINGS) ×2 IMPLANT
ELECT REM PT RETURN 9FT ADLT (ELECTROSURGICAL) ×2
ELECTRODE REM PT RTRN 9FT ADLT (ELECTROSURGICAL) ×1 IMPLANT
GAUZE SPONGE 4X4 12PLY STRL (GAUZE/BANDAGES/DRESSINGS) IMPLANT
GAUZE SPONGE 4X4 12PLY STRL LF (GAUZE/BANDAGES/DRESSINGS) IMPLANT
GLOVE SURG ENC MOIS LTX SZ6 (GLOVE) ×2 IMPLANT
GLOVE SURG UNDER POLY LF SZ6.5 (GLOVE) ×2 IMPLANT
GOWN STRL REUS W/ TWL LRG LVL3 (GOWN DISPOSABLE) ×1 IMPLANT
GOWN STRL REUS W/TWL 2XL LVL3 (GOWN DISPOSABLE) ×2 IMPLANT
GOWN STRL REUS W/TWL LRG LVL3 (GOWN DISPOSABLE) ×2
IV SET EXT 30 76VOL 4 MALE LL (IV SETS) IMPLANT
NDL SAFETY ECLIPSE 18X1.5 (NEEDLE) ×1 IMPLANT
NEEDLE HYPO 18GX1.5 SHARP (NEEDLE) ×2
NEEDLE HYPO 25X1 1.5 SAFETY (NEEDLE) ×4 IMPLANT
NS IRRIG 1000ML POUR BTL (IV SOLUTION) ×2 IMPLANT
PACK BASIN DAY SURGERY FS (CUSTOM PROCEDURE TRAY) ×2 IMPLANT
PACK UNIVERSAL I (CUSTOM PROCEDURE TRAY) ×2 IMPLANT
PENCIL SMOKE EVACUATOR (MISCELLANEOUS) ×2 IMPLANT
SLEEVE SCD COMPRESS KNEE MED (STOCKING) ×2 IMPLANT
SLING ARM FOAM STRAP LRG (SOFTGOODS) IMPLANT
SPONGE GAUZE 2X2 8PLY STRL LF (GAUZE/BANDAGES/DRESSINGS) IMPLANT
SPONGE T-LAP 18X18 ~~LOC~~+RFID (SPONGE) IMPLANT
STOCKINETTE IMPERVIOUS LG (DRAPES) IMPLANT
STRIP CLOSURE SKIN 1/2X4 (GAUZE/BANDAGES/DRESSINGS) IMPLANT
SUT ETHILON 2 0 FS 18 (SUTURE) IMPLANT
SUT MON AB 4-0 PC3 18 (SUTURE) ×2 IMPLANT
SUT SILK 2 0 SH (SUTURE) IMPLANT
SUT VIC AB 2-0 SH 27 (SUTURE)
SUT VIC AB 2-0 SH 27XBRD (SUTURE) IMPLANT
SUT VIC AB 3-0 SH 27 (SUTURE) ×2
SUT VIC AB 3-0 SH 27X BRD (SUTURE) ×1 IMPLANT
SYR BULB EAR ULCER 3OZ GRN STR (SYRINGE) ×2 IMPLANT
SYR CONTROL 10ML LL (SYRINGE) ×2 IMPLANT
SYR TB 1ML LL NO SAFETY (SYRINGE) ×2 IMPLANT
TOWEL GREEN STERILE FF (TOWEL DISPOSABLE) ×2 IMPLANT
TUBE CONNECTING 20X1/4 (TUBING) IMPLANT
UNDERPAD 30X36 HEAVY ABSORB (UNDERPADS AND DIAPERS) ×2 IMPLANT
YANKAUER SUCT BULB TIP NO VENT (SUCTIONS) IMPLANT

## 2021-05-31 NOTE — Anesthesia Preprocedure Evaluation (Signed)
Anesthesia Evaluation  Patient identified by MRN, date of birth, ID band Patient awake    Reviewed: Allergy & Precautions, H&P , NPO status , Patient's Chart, lab work & pertinent test results  History of Anesthesia Complications (+) PONV and history of anesthetic complications  Airway Mallampati: II  TM Distance: >3 FB Neck ROM: Full    Dental  (+) Dental Advisory Given   Pulmonary sleep apnea , former smoker,    breath sounds clear to auscultation       Cardiovascular hypertension, Pt. on medications + Peripheral Vascular Disease   Rhythm:Regular Rate:Normal     Neuro/Psych PSYCHIATRIC DISORDERS Anxiety Depression negative neurological ROS     GI/Hepatic Neg liver ROS, GERD  Medicated,  Endo/Other  diabetes, Type 2, Oral Hypoglycemic AgentsHypothyroidism   Renal/GU negative Renal ROS     Musculoskeletal  (+) Arthritis , Osteoarthritis,    Abdominal (+) + obese,   Peds  Hematology negative hematology ROS (+)   Anesthesia Other Findings   Reproductive/Obstetrics negative OB ROS                             Anesthesia Physical  Anesthesia Plan  ASA: III  Anesthesia Plan: General   Post-op Pain Management:    Induction: Intravenous  PONV Risk Score and Plan: 3 and Ondansetron, Dexamethasone, Midazolam and Treatment may vary due to age or medical condition  Airway Management Planned: LMA  Additional Equipment:   Intra-op Plan:   Post-operative Plan: Extubation in OR  Informed Consent: I have reviewed the patients History and Physical, chart, labs and discussed the procedure including the risks, benefits and alternatives for the proposed anesthesia with the patient or authorized representative who has indicated his/her understanding and acceptance.     Dental advisory given  Plan Discussed with: CRNA  Anesthesia Plan Comments:         Anesthesia Quick  Evaluation

## 2021-05-31 NOTE — Interval H&P Note (Signed)
History and Physical Interval Note:  05/31/2021 10:46 AM  [name]  has presented today for surgery, with the diagnosis of MERKEL CELL CARCINOMA RIGHT FOREARM.  The various methods of treatment have been discussed with the patient and family. After consideration of risks, benefits and other options for treatment, the patient has consented to  Procedure(s): WIDE LOCAL EXCISION RIGHT FOREARM WITH ADVANCEDMENT FLAP CLOSURE WITH SENTINEL LYMPH NODE MAPPING AND BIOSPY (N/A) as a surgical intervention.  The patient's history has been reviewed, patient examined, no change in status, stable for surgery.  I have reviewed the patient's chart and labs.  Questions were answered to the patient's satisfaction.     [name]

## 2021-05-31 NOTE — Anesthesia Procedure Notes (Signed)
Procedure Name: LMA Insertion Date/Time: 05/31/2021 11:24 AM Performed by: Maryella Shivers, CRNA Pre-anesthesia Checklist: Patient identified, Emergency Drugs available, Suction available and Patient being monitored Patient Re-evaluated:Patient Re-evaluated prior to induction Oxygen Delivery Method: Circle system utilized Preoxygenation: Pre-oxygenation with 100% oxygen Induction Type: IV induction Ventilation: Mask ventilation without difficulty LMA: LMA inserted LMA Size: 5.0 Number of attempts: 1 Airway Equipment and Method: Bite block Placement Confirmation: positive ETCO2 Tube secured with: Tape Dental Injury: Teeth and Oropharynx as per pre-operative assessment

## 2021-05-31 NOTE — Discharge Instructions (Addendum)
[place] Office Phone Number [phone]   POST OP INSTRUCTIONS  Always review your discharge instruction sheet given to you by the facility where your surgery was performed.  IF YOU HAVE DISABILITY OR FAMILY LEAVE FORMS, YOU MUST BRING THEM TO THE OFFICE FOR PROCESSING.  DO NOT GIVE THEM TO YOUR DOCTOR.  A prescription for pain medication may be given to you upon discharge.  Take your pain medication as prescribed, if needed.  If narcotic pain medicine is not needed, then you may take acetaminophen (Tylenol) or ibuprofen (Advil) as needed. Take your usually prescribed medications unless otherwise directed If you need a refill on your pain medication, please contact your pharmacy.  They will contact our office to request authorization.  Prescriptions will not be filled after 5pm or on week-ends. You should eat very light the first 24 hours after surgery, such as soup, crackers, pudding, etc.  Resume your normal diet the day after surgery It is common to experience some constipation if taking pain medication after surgery.  Increasing fluid intake and taking a stool softener will usually help or prevent this problem from occurring.  A mild laxative (Milk of Magnesia or Miralax) should be taken according to package directions if there are no bowel movements after 48 hours. You may shower in 48 hours.  The surgical glue will flake off in 2-3 weeks.  The arm dressing can be left open after 2 days, but it is OK to cover it if you want.   ACTIVITIES:  No strenuous activity or heavy lifting for 2 weeks.   You may drive when you no longer are taking prescription pain medication, you can comfortably wear a seatbelt, and you can safely maneuver your car and apply brakes. RETURN TO WORK:  __________n/a_______________ Dennis Bast should see your doctor in the office for a follow-up appointment approximately three-four weeks after your surgery.    WHEN TO CALL YOUR DOCTOR: Fever over  101.0 Nausea and/or vomiting. Extreme swelling or bruising. Continued bleeding from incision. Increased pain, redness, or drainage from the incision.  The clinic staff is available to answer your questions during regular business hours.  Please don't hesitate to call and ask to speak to one of the nurses for clinical concerns.  If you have a medical emergency, go to the nearest emergency room or call 911.  A surgeon from [facility] is always on call at the hospital.  For further questions, please visit [email or web address]   May take Tylenol after 3pm, if needed.   Post Anesthesia Home Care Instructions  Activity: Get plenty of rest for the remainder of the day. A responsible individual must stay with you for 24 hours following the procedure.  For the next 24 hours, DO NOT: -Drive a car -Paediatric nurse -Drink alcoholic beverages -Take any medication unless instructed by your physician -Make any legal decisions or sign important papers.  Meals: Start with liquid foods such as gelatin or soup. Progress to regular foods as tolerated. Avoid greasy, spicy, heavy foods. If nausea and/or vomiting occur, drink only clear liquids until the nausea and/or vomiting subsides. Call your physician if vomiting continues.  Special Instructions/Symptoms: Your throat may feel dry or sore from the anesthesia or the breathing tube placed in your throat during surgery. If this causes discomfort, gargle with warm salt water. The discomfort should disappear within 24 hours.  If you had a scopolamine patch placed behind your ear for the management of post- operative nausea and/or vomiting:  1.  The medication in the patch is effective for 72 hours, after which it should be removed.  Wrap patch in a tissue and discard in the trash. Wash hands thoroughly with soap and water. 2. You may remove the patch earlier than 72 hours if you experience unpleasant side effects which may include dry  mouth, dizziness or visual disturbances. 3. Avoid touching the patch. Wash your hands with soap and water after contact with the patch.

## 2021-05-31 NOTE — Anesthesia Postprocedure Evaluation (Signed)
Anesthesia Post Note  Patient: [name]  Procedure(s) Performed: WIDE LOCAL EXCISION RIGHT FOREARM WITH ADVANCEDMENT FLAP CLOSURE WITH SENTINEL LYMPH NODE MAPPING AND BIOSPY (Right)     Patient location during evaluation: PACU Anesthesia Type: General Level of consciousness: awake and alert Pain management: pain level controlled Vital Signs Assessment: post-procedure vital signs reviewed and stable Respiratory status: spontaneous breathing, nonlabored ventilation and respiratory function stable Cardiovascular status: blood pressure returned to baseline and stable Postop Assessment: no apparent nausea or vomiting Anesthetic complications: no   No notable events documented.  Last Vitals:  Vitals:   05/31/21 1330 05/31/21 1344  BP: (!) 142/84 (!) 156/84  Pulse: 82 77  Resp: 18 17  Temp:  36.5 C  SpO2: 96% 98%    Last Pain:  Vitals:   05/31/21 1344  TempSrc:   PainSc: 0-No pain                 Lynda Rainwater

## 2021-05-31 NOTE — Transfer of Care (Signed)
Immediate Anesthesia Transfer of Care Note  Patient: [name]  Procedure(s) Performed: WIDE LOCAL EXCISION RIGHT FOREARM WITH ADVANCEDMENT FLAP CLOSURE WITH SENTINEL LYMPH NODE MAPPING AND BIOSPY (Right)  Patient Location: PACU  Anesthesia Type:General  Level of Consciousness: sedated  Airway & Oxygen Therapy: Patient Spontanous Breathing and Patient connected to face mask oxygen  Post-op Assessment: Report given to RN and Post -op Vital signs reviewed and stable  Post vital signs: Reviewed and stable  Last Vitals:  Vitals Value Taken Time  BP 110/59 05/31/21 1300  Temp    Pulse 67 05/31/21 1302  Resp 16 05/31/21 1302  SpO2 96 % 05/31/21 1302  Vitals shown include unvalidated device data.  Last Pain:  Vitals:   05/31/21 0857  TempSrc: Oral      Patients Stated Pain Goal: 2 (45/84/83 5075)  Complications: No notable events documented.

## 2021-05-31 NOTE — Op Note (Signed)
PRE-OPERATIVE DIAGNOSIS: cT2N0 right forearm merkel cell carcinoma  POST-OPERATIVE DIAGNOSIS:  Same  PROCEDURE:  Procedure(s): Wide local excision 2 cm margins, advancement flap closure for defect 8.2 cm x 4.1 cm, right axillary sentinel lymph node mapping and biopsy  SURGEON:  Surgeon(s): [name], MD  ANESTHESIA:   local and general  DRAINS: none   LOCAL MEDICATIONS USED:  MARCAINE    and XYLOCAINE   SPECIMEN:  Source of Specimen:  Three right axillary sentinel lymph nodes, wide local excision right forearm merkel cell carcinoma  FINDINGS:  no gross residual disease.  SLN #1 palpable, SLN #2 hot and blue, sl enlarged; SLN #3   DISPOSITION OF SPECIMEN:  PATHOLOGY  COUNTS:  YES  PLAN OF CARE: Discharge to home after PACU  PATIENT DISPOSITION:  PACU - hemodynamically stable.    PROCEDURE:   Pt was identified in the holding area, taken to the OR, and placed supine on the OR table.  General anesthesia was induced.  Time out was performed according to the surgical safety checklist.  When all was correct, we continued.  One mL methylene blue was injected intradermally around the melanoma biopsy site.    The patient was placed into the supine position.  The right upper extremity was prepped and draped in sterile fashion.  The cancer biopsy site was identified and 2 cm margins were marked out.  Local was administered under the melanoma and the adjacent tissue.  A #10 blade was used to incise the skin around the melanoma. This was made into an ellipse.  The cautery was used to take the dissection down to the fascia.  The skin was marked in situ with orientation sutures.  The cautery was used to take the specimen off the fascia, and it was passed off the table.    Skin hooks were used to elevate the edges of the incision and the skin was freed up in all directions.  This was pulled together in a longitudinal orientation. The skin was pulled together to check the tension. Due to the  patient's thin skin, interrupted 2-0 nylon horizontal mattress sutures were placed to primarily close the wound.  The skin was reapproximated with 4-0 monocryl running subcuticular sutures.    The point of maximum signal intensity in the right axilla was identified with the neoprobe.  A 4 cm incision was made with a #15 blade.  The subcutaneous tissues were divided with the cautery.  A Weitlaner retractor was used to assist with visualization.  The tonsil clamp was used to bluntly dissect the axillary fat pad.  Three sentinel lymph nodes were identified as described above.  The lymphovascular channels were clipped with hemoclips.  The nodes were passed off as specimens.  Hemostasis was achieved with the cautery.  The axilla was irrigated and closed with 3-0 Vicryl deep dermal interrupted sutures and 4-0 Monocryl running subcuticular suture.  The axilla was dressed with dermabond.    The melanoma site was cleaned, dried, and dressed with Benzoin, steristrips, gauze, and kerlix wrap.    Needle, sponge, and instrument counts were correct.  The patient was awakened from anesthesia and taken to the PACU in stable condition.

## 2021-06-03 ENCOUNTER — Encounter (HOSPITAL_BASED_OUTPATIENT_CLINIC_OR_DEPARTMENT_OTHER): Payer: Self-pay | Admitting: General Surgery

## 2021-06-07 LAB — SURGICAL PATHOLOGY

## 2021-06-08 ENCOUNTER — Other Ambulatory Visit: Payer: Self-pay

## 2021-06-08 ENCOUNTER — Encounter (HOSPITAL_COMMUNITY)
Admission: RE | Admit: 2021-06-08 | Discharge: 2021-06-08 | Disposition: A | Discharge status: Home / Self Care | Payer: Medicare Other | Source: Ambulatory Visit | Attending: Family Medicine | Admitting: Family Medicine

## 2021-06-08 ENCOUNTER — Encounter: Payer: Self-pay | Admitting: Family Medicine

## 2021-06-08 DIAGNOSIS — R590 Localized enlarged lymph nodes: Secondary | ICD-10-CM | POA: Diagnosis not present

## 2021-06-08 DIAGNOSIS — C4A4 Merkel cell carcinoma of scalp and neck: Secondary | ICD-10-CM | POA: Diagnosis not present

## 2021-06-08 DIAGNOSIS — L7634 Postprocedural seroma of skin and subcutaneous tissue following other procedure: Secondary | ICD-10-CM | POA: Diagnosis not present

## 2021-06-08 LAB — GLUCOSE, CAPILLARY: Glucose-Capillary: 159 mg/dL — ABNORMAL HIGH (ref 70–99)

## 2021-06-08 MED ORDER — FLUDEOXYGLUCOSE F - 18 (FDG) INJECTION
13.5000 | Freq: Once | INTRAVENOUS | Status: AC | PRN
  Administered 2021-06-08: 12 via INTRAVENOUS

## 2021-06-09 DIAGNOSIS — T8132XA Disruption of internal operation (surgical) wound, not elsewhere classified, initial encounter: Secondary | ICD-10-CM | POA: Diagnosis not present

## 2021-06-11 ENCOUNTER — Encounter: Payer: Self-pay | Admitting: Family Medicine

## 2021-06-11 ENCOUNTER — Encounter (INDEPENDENT_AMBULATORY_CARE_PROVIDER_SITE_OTHER): Payer: Medicare Other | Admitting: Ophthalmology

## 2021-06-11 ENCOUNTER — Ambulatory Visit (INDEPENDENT_AMBULATORY_CARE_PROVIDER_SITE_OTHER): Payer: Medicare Other | Admitting: Family Medicine

## 2021-06-11 ENCOUNTER — Other Ambulatory Visit: Payer: Self-pay

## 2021-06-11 VITALS — BP 138/84 | HR 73 | Temp 98.9°F | Ht 73.0 in | Wt 246.0 lb

## 2021-06-11 DIAGNOSIS — H35033 Hypertensive retinopathy, bilateral: Secondary | ICD-10-CM

## 2021-06-11 DIAGNOSIS — I152 Hypertension secondary to endocrine disorders: Secondary | ICD-10-CM

## 2021-06-11 DIAGNOSIS — E039 Hypothyroidism, unspecified: Secondary | ICD-10-CM | POA: Diagnosis not present

## 2021-06-11 DIAGNOSIS — I10 Essential (primary) hypertension: Secondary | ICD-10-CM | POA: Diagnosis not present

## 2021-06-11 DIAGNOSIS — H43813 Vitreous degeneration, bilateral: Secondary | ICD-10-CM

## 2021-06-11 DIAGNOSIS — E1159 Type 2 diabetes mellitus with other circulatory complications: Secondary | ICD-10-CM | POA: Diagnosis not present

## 2021-06-11 DIAGNOSIS — H353231 Exudative age-related macular degeneration, bilateral, with active choroidal neovascularization: Secondary | ICD-10-CM | POA: Diagnosis not present

## 2021-06-11 DIAGNOSIS — G4700 Insomnia, unspecified: Secondary | ICD-10-CM

## 2021-06-11 NOTE — Patient Instructions (Addendum)
Health Maintenance Due  Topic Date Due   Zoster Vaccines- Shingrix (1 of 2) Please check with your pharmacy to see if they have the shingrix vaccine. If they do- please get this immunization and update [place] by phone call or mychart with dates you receive the vaccine  Never done   OPHTHALMOLOGY EXAM Please sign a release for so that the [place] can release his medical records.  01/02/2021   Try low dose melatonin 1-3 mg 30 minutes before bed.   Change dressing daily if possible.    Recommended follow up: No follow-ups on file.

## 2021-06-11 NOTE — Progress Notes (Signed)
Phone [phone] In person visit   Subjective:   [name] is a 80 y.o. year old very pleasant male patient who presents for/with See problem oriented charting Chief Complaint  Patient presents with   Insomnia    Patient is sleeping at night but not through the night. He's going to be at 9 waking up at 2-3am    Wound Check    Under his right arm and the top of his right arm     This visit occurred during the SARS-CoV-2 public health emergency.  Safety protocols were in place, including screening questions prior to the visit, additional usage of staff PPE, and extensive cleaning of exam room while observing appropriate contact time as indicated for disinfecting solutions.   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[place]) 93/26/7124    Priority: High   Diabetic retinopathy ([place]) 09/24/2007    Priority: High   Tremor 08/11/2015    Priority: Medium   Hypothyroidism     Priority: Medium   Lapband APL + [place] repair 08/12/2013    Priority: Medium   Insomnia 09/21/2009    Priority: Medium   Hyperlipidemia associated with type 2 diabetes mellitus ([place]) 09/24/2007    Priority: Medium   OSA on CPAP 09/24/2007    Priority: Medium   Hypertension associated with diabetes ([place]) 09/24/2007    Priority: Medium   Bell's palsy 05/08/2020    Priority: Low   Personal history of skin cancer 04/02/2017    Priority: Low   GERD (gastroesophageal reflux disease) 12/08/2014    Priority: Low   History of colonic polyps 08/22/2008    Priority: Low   Gynecomastia 12/28/2020   Vitamin D deficiency 11/01/2020   Senile purpura ([place]) 05/02/2020   Sacroiliac joint dysfunction 06/29/2018   Ear  pressure, left 04/14/2017   Temporomandibular joint (TMJ) pain 04/14/2017   Pain syndrome, chronic 02/01/2016   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 myelopathy or radiculopathy 02/01/2016    Medications- reviewed and updated Current Outpatient Medications  Medication Sig Dispense Refill   amLODipine (NORVASC) 10 MG tablet Take 10 mg by mouth daily.     azelastine (ASTELIN) 0.1 % nasal spray Place 1 spray into both nostrils 2 (two) times daily. Use in each nostril as directed 30 mL 12   Cholecalciferol 50 MCG (2000 UT) TABS Take by mouth.     fluticasone (FLONASE) 50 MCG/ACT nasal spray Place into both nostrils.     glucose 4 GM chewable tablet Chew by mouth.     glucose blood test strip Use to test 4 times daily. E11.9 100 each 12   hydrochlorothiazide (HYDRODIURIL) 25 MG tablet Take 25 mg by mouth daily.     insulin aspart (NOVOLOG) 100 UNIT/ML injection Inject 30 Units into the skin 3 (three) times daily with meals. When sugar is elevated     insulin glargine (LANTUS) 100 UNIT/ML injection Inject 50 Units into the skin at bedtime.     lansoprazole (PREVACID) 30 MG capsule Take 1 capsule (30 mg total) by mouth daily at 12 noon. 30 capsule 5   levothyroxine (SYNTHROID, LEVOTHROID) 50 MCG tablet Take 50 mcg by mouth daily before breakfast.  losartan (COZAAR) 100 MG tablet Take 100 mg by mouth daily.     mirtazapine (REMERON) 15 MG tablet Take 15 mg by mouth at bedtime.     OVER THE COUNTER MEDICATION PreserVision for eyes     potassium chloride SA (K-DUR,KLOR-CON) 20 MEQ tablet Take 20 mEq by mouth daily.     Semaglutide (OZEMPIC, 0.25 OR 0.5 MG/DOSE, [place]) Inject 0.5 mg into the skin once a week.     simvastatin (ZOCOR) 80 MG tablet Take half tablet (40 mg) by mouth once daily     sucralfate (CARAFATE) 1 G tablet Take 1 g by mouth 4 (four) times daily -  with meals and at bedtime.     traMADol (ULTRAM) 50 MG tablet Take 1 tablet (50 mg total) by mouth every 6 (six) hours as needed. 10  tablet 0   No current facility-administered medications for this visit.     Objective:  BP 138/84   Pulse 73   Temp 98.9 F (37.2 C) (Temporal)   Ht 6\' 1"  (1.854 m)   Wt 246 lb (111.6 kg)   SpO2 96%   BMI 32.46 kg/m  Gen: NAD, resting comfortably Skin: warm, dry, well-healing seroma in right axilla-no active drainage but on dressing there was some serous fluid.  Right forearm healing well-no surrounding increased erythema or warmth or tenderness-both areas were redressed Baseline tremor noted    Assessment and Plan   # Insomnia Cato Mulligan S: Patient reports sleep maintenance insomnia.  He is able to fall asleep around 9 but is waking up around 2 or 3 AM.  Reports feeling tired during the day after not sleeping well.    Patient did recently undergo surgery on his right arm for Merkel cell carcinoma. He has been more anxious since surgery as well.   In the past patient was on Remeron through the [place]- still on this medicine at 15 mg A/P: Insomnia is poorly controlled despite taking mirtazapine 15 mg-recommended trial of melatonin 1 to 3 mg at bedtime.  Also consider trazodone but do not want to use trazodone and mirtazapine together.  He will update me if not improved within 2 weeks-I would also consider may be short-term higher dose of mirtazapine -I am hopeful if he sleeps better anxiety will improve as well  #Wound evaluation-patient with seroma in right axilla-had to have urgent care visit to stop bleeding after suture came undone over the weekend-no further bleeding but some drainage from the seroma-we discussed this can be normal-if has redness, increasing warmth should follow-up-nonabsorbable sutures will likely be removed when seeing surgery again.  This was dressed primarily to not have to drain onto his clothes-using several 4 x 4 pads.  In regards to the surgical site-appears to be healing well-we redressed this primarily just the patient does not hit this area but is healing  well  # Gynecomastia  S:Noticed some increased swelling in both breast area. He recently saw general surgeon who did not recommended surgical treatment A/P: See 12/18/20-still did not see clear space for reduction of medication other than potentially simvastatin-offered to reduce this dose-  - was seen by Dr. Hassell Done of surgery who did not recommend surgery.    #hypothyroidism S: compliant On thyroid medication-levothyroxine 50 mg   Lab Results  Component Value Date   TSH 1.77 11/27/2020   A/P:Stable. Continue current medications.  Patient has had a lot of blood work recently and does not want to repeat at this time-consider repeat TSH next visit  #  hypertension S: medication: Hydrochlorothiazide 25 mg, Losartan 100 mg, amlodipine 10 mg(some edema in past and some at present-wears compression stockings)-propranolol for tremor lowered HR today so he stopped this BP Readings from Last 3 Encounters:  06/11/21 138/84  05/31/21 (!) 156/84  05/04/21 126/72  A/P: High normal but control/stable. Continue current medications.    Recommended follow up: No follow-ups on file. Future Appointments  Date Time Provider [place]  06/28/2021  8:30 AM [facility] NURSE [facility]  07/13/2021  7:45 AM Hayden Pedro, MD [facility] None  08/08/2021  9:30 AM [facility] LAB [facility] None  08/08/2021 10:00 AM Wyatt Portela, MD [facility] None  09/05/2021  8:40 AM [name], MD [facility]  09/11/2021  9:15 AM [name], MD [facility]  12/05/2021  1:30 PM [facility] CCM PHARMACIST [facility]    Lab/Order associations:   ICD-10-CM   1. Insomnia, unspecified type  G47.00     2. Hypertension associated with diabetes ([place])  E11.59    I15.2     3. Hypothyroidism, unspecified type  E03.9      I,Alexis Bryant,acting as a scribe for [name], MD.,have documented all relevant documentation on the behalf of [name], MD,as directed by  [name], MD while in the  presence of [name], MD.   I, [name], MD, have reviewed all documentation for this visit. The documentation on 06/11/21 for the exam, diagnosis, procedures, and orders are all accurate and complete.   Return precautions advised.  [name], MD

## 2021-06-19 DIAGNOSIS — Z79899 Other long term (current) drug therapy: Secondary | ICD-10-CM | POA: Diagnosis not present

## 2021-06-19 DIAGNOSIS — G8929 Other chronic pain: Secondary | ICD-10-CM | POA: Diagnosis not present

## 2021-06-19 DIAGNOSIS — M533 Sacrococcygeal disorders, not elsewhere classified: Secondary | ICD-10-CM | POA: Diagnosis not present

## 2021-06-19 DIAGNOSIS — G894 Chronic pain syndrome: Secondary | ICD-10-CM | POA: Diagnosis not present

## 2021-06-19 DIAGNOSIS — M47816 Spondylosis without myelopathy or radiculopathy, lumbar region: Secondary | ICD-10-CM | POA: Diagnosis not present

## 2021-06-19 DIAGNOSIS — Z5181 Encounter for therapeutic drug level monitoring: Secondary | ICD-10-CM | POA: Diagnosis not present

## 2021-06-19 DIAGNOSIS — M25551 Pain in right hip: Secondary | ICD-10-CM | POA: Diagnosis not present

## 2021-06-22 DIAGNOSIS — E039 Hypothyroidism, unspecified: Secondary | ICD-10-CM | POA: Insufficient documentation

## 2021-06-25 DIAGNOSIS — C4A61 Merkel cell carcinoma of right upper limb, including shoulder: Secondary | ICD-10-CM

## 2021-06-25 DIAGNOSIS — Z85821 Personal history of Merkel cell carcinoma: Secondary | ICD-10-CM | POA: Insufficient documentation

## 2021-06-25 HISTORY — DX: Merkel cell carcinoma of right upper limb, including shoulder: C4A.61

## 2021-06-28 ENCOUNTER — Other Ambulatory Visit: Payer: Self-pay

## 2021-06-28 ENCOUNTER — Ambulatory Visit (INDEPENDENT_AMBULATORY_CARE_PROVIDER_SITE_OTHER): Payer: Medicare Other

## 2021-06-28 DIAGNOSIS — E538 Deficiency of other specified B group vitamins: Secondary | ICD-10-CM

## 2021-06-28 MED ORDER — CYANOCOBALAMIN 1000 MCG/ML IJ SOLN
1000.0000 ug | Freq: Once | INTRAMUSCULAR | Status: AC
  Administered 2021-06-28: 1000 ug via INTRAMUSCULAR

## 2021-06-28 NOTE — Progress Notes (Signed)
Per orders of Dr. Yong Channel , injection of Cyancobalamin 1000 mcg/ml given Left deltoid IM by Linus Galas CMA, Patient tolerated injection well.

## 2021-07-03 ENCOUNTER — Encounter: Payer: Self-pay | Admitting: Family Medicine

## 2021-07-04 NOTE — Telephone Encounter (Signed)
Faxed over [facility].

## 2021-07-13 ENCOUNTER — Encounter (INDEPENDENT_AMBULATORY_CARE_PROVIDER_SITE_OTHER): Payer: Medicare Other | Admitting: Ophthalmology

## 2021-07-13 ENCOUNTER — Other Ambulatory Visit: Payer: Self-pay

## 2021-07-13 DIAGNOSIS — H35033 Hypertensive retinopathy, bilateral: Secondary | ICD-10-CM | POA: Diagnosis not present

## 2021-07-13 DIAGNOSIS — I10 Essential (primary) hypertension: Secondary | ICD-10-CM | POA: Diagnosis not present

## 2021-07-13 DIAGNOSIS — H43813 Vitreous degeneration, bilateral: Secondary | ICD-10-CM | POA: Diagnosis not present

## 2021-07-13 DIAGNOSIS — H353231 Exudative age-related macular degeneration, bilateral, with active choroidal neovascularization: Secondary | ICD-10-CM

## 2021-07-17 ENCOUNTER — Telehealth: Payer: Self-pay | Admitting: Pharmacist

## 2021-07-17 NOTE — Chronic Care Management (AMB) (Addendum)
Chronic Care Management Pharmacy Assistant   Name: [name]  MRN: [id number] DOB: [birth year]  Reason for Encounter: Diabetes Adherence Call    Recent office visits:  06/11/2021 [facility] (PCP) [name], MD; chronic follow up, no medication changes indicated.  Recent consult visits:  None  Hospital visits:  05/31/2021 Procedure wide local excision by surgeon [name], MD  Medications: Outpatient Encounter Medications as of 07/17/2021  Medication Sig   amLODipine (NORVASC) 10 MG tablet Take 10 mg by mouth daily.   azelastine (ASTELIN) 0.1 % nasal spray Place 1 spray into both nostrils 2 (two) times daily. Use in each nostril as directed   Cholecalciferol 50 MCG (2000 UT) TABS Take by mouth.   fluticasone (FLONASE) 50 MCG/ACT nasal spray Place into both nostrils.   glucose 4 GM chewable tablet Chew by mouth.   glucose blood test strip Use to test 4 times daily. E11.9   hydrochlorothiazide (HYDRODIURIL) 25 MG tablet Take 25 mg by mouth daily.   insulin aspart (NOVOLOG) 100 UNIT/ML injection Inject 30 Units into the skin 3 (three) times daily with meals. When sugar is elevated   insulin glargine (LANTUS) 100 UNIT/ML injection Inject 50 Units into the skin at bedtime.   lansoprazole (PREVACID) 30 MG capsule Take 1 capsule (30 mg total) by mouth daily at 12 noon.   levothyroxine (SYNTHROID, LEVOTHROID) 50 MCG tablet Take 50 mcg by mouth daily before breakfast.   losartan (COZAAR) 100 MG tablet Take 100 mg by mouth daily.   mirtazapine (REMERON) 15 MG tablet Take 15 mg by mouth at bedtime.   OVER THE COUNTER MEDICATION PreserVision for eyes   potassium chloride SA (K-DUR,KLOR-CON) 20 MEQ tablet Take 20 mEq by mouth daily.   Semaglutide (OZEMPIC, 0.25 OR 0.5 MG/DOSE, [place]) Inject 0.5 mg into the skin once a week.   simvastatin (ZOCOR) 80 MG tablet Take half tablet (40 mg) by mouth once daily   sucralfate (CARAFATE) 1 G tablet Take 1 g by mouth 4 (four) times daily -  with  meals and at bedtime.   traMADol (ULTRAM) 50 MG tablet Take 1 tablet (50 mg total) by mouth every 6 (six) hours as needed.   No facility-administered encounter medications on file as of 07/17/2021.    Recent Relevant Labs: Lab Results  Component Value Date/Time   HGBA1C 6.5 03/29/2021 12:00 AM   HGBA1C 6.9 09/18/2020 12:00 AM   MICROALBUR 0.681 03/29/2021 12:00 AM   MICROALBUR 2.58 02/27/2016 12:00 AM    Kidney Function Lab Results  Component Value Date/Time   CREATININE 1.06 05/29/2021 10:42 AM   CREATININE 1.00 12/23/2020 09:36 AM   CREATININE 1.08 08/26/2019 09:08 AM   CREATININE 1.2 10/30/2017 08:45 AM   CREATININE 1.0 04/29/2017 09:09 AM   GFR 87.47 05/02/2020 01:23 PM   GFRNONAA >60 05/29/2021 10:42 AM   GFRNONAA >60 08/26/2019 09:08 AM   GFRAA >60 08/26/2019 09:08 AM    Current antihyperglycemic regimen:  Ozempic 0.5 mg once a week Lantus 50 units at bedtime Novolog 30 units with meals  What recent interventions/DTPs have been made to improve glycemic control:  No interventions or DTPs.  Have there been any recent hospitalizations or ED visits since last visit with CPP? No  Patient denies hypoglycemic symptoms.  Patient denies hyperglycemic symptoms.  How often are you checking your blood sugar? 3-4 times daily  What are your blood sugars ranging?  Fasting: 118 Before meals: n/a After meals: 176 Bedtime: n/a  During  the week, how often does your blood glucose drop below 70? Never  Are you checking your feet daily/regularly? Patient states his wife checks his feet a few times a week.  Adherence Review: Is the patient currently on a STATIN medication? Yes Is the patient currently on ACE/ARB medication? Yes Does the patient have >5 day gap between last estimated fill dates? No   Future Appointments  Date Time Provider [place]  07/25/2021  9:00 AM [facility] NURSE [facility]  08/08/2021  9:30 AM [facility] LAB [facility] None  08/08/2021 10:00  AM Shadad, Mathis Dad, MD [facility] None  08/13/2021  7:45 AM Hayden Pedro, MD [facility] None  09/05/2021  8:40 AM [name], MD [facility] PEC  09/11/2021  9:15 AM [name], MD [facility]  12/05/2021  1:30 PM [facility] CCM PHARMACIST [facility] PEC   Star Rating Drugs: Losartan Potassium 100 mg last filled 05/07/2021 90 DS Simvastatin 80 mg last filled 06/03/2021 90 DS  April D Calhoun, [place] Pharmacist Assistant [phone]   10 minutes spent in review, coordination, and documentation.  Reviewed by: Beverly Milch, PharmD Clinical Pharmacist [phone]

## 2021-07-25 ENCOUNTER — Other Ambulatory Visit: Payer: Self-pay

## 2021-07-25 ENCOUNTER — Ambulatory Visit (INDEPENDENT_AMBULATORY_CARE_PROVIDER_SITE_OTHER): Payer: Medicare Other

## 2021-07-25 DIAGNOSIS — E538 Deficiency of other specified B group vitamins: Secondary | ICD-10-CM

## 2021-07-25 MED ORDER — CYANOCOBALAMIN 1000 MCG/ML IJ SOLN
1000.0000 ug | Freq: Once | INTRAMUSCULAR | Status: AC
  Administered 2021-07-25: 1000 ug via INTRAMUSCULAR

## 2021-07-25 NOTE — Progress Notes (Signed)
Per orders of Dr. Yong Channel, injection of B-12 given by Tobe Sos in left deltoid. Patient tolerated injection well. Patient will make appointment for 1 month.

## 2021-07-26 LAB — BASIC METABOLIC PANEL
CO2: 28 — AB (ref 13–22)
Chloride: 105 (ref 99–108)
Creatinine: 1.1 (ref 0.6–1.3)
Creatinine: 1.1 (ref 0.6–1.3)
Glucose: 133
Potassium: 4.1 (ref 3.4–5.3)
Potassium: 4.1 (ref 3.4–5.3)
Sodium: 139 (ref 137–147)
Sodium: 139 (ref 137–147)

## 2021-07-26 LAB — CBC AND DIFFERENTIAL
HCT: 47 (ref 41–53)
HCT: 47 (ref 41–53)
Hemoglobin: 15.8 (ref 13.5–17.5)
Hemoglobin: 15.8 (ref 13.5–17.5)
Neutrophils Absolute: 26
Platelets: 250 (ref 150–399)
Platelets: 250 (ref 150–399)
WBC: 23.9
WBC: 23.9

## 2021-07-26 LAB — PSA: PSA: <0.01

## 2021-07-26 LAB — COMPREHENSIVE METABOLIC PANEL: Calcium: 9 (ref 8.7–10.7)

## 2021-07-26 LAB — CBC
RBC: 5.66 — AB (ref 3.87–5.11)
RBC: 5.66 — AB (ref 3.87–5.11)

## 2021-07-26 LAB — TSH: TSH: 1.38 (ref 0.41–5.90)

## 2021-08-01 ENCOUNTER — Encounter: Payer: Self-pay | Admitting: Family Medicine

## 2021-08-08 ENCOUNTER — Inpatient Hospital Stay (HOSPITAL_BASED_OUTPATIENT_CLINIC_OR_DEPARTMENT_OTHER): Payer: Medicare Other | Admitting: Oncology

## 2021-08-08 ENCOUNTER — Other Ambulatory Visit: Payer: Self-pay

## 2021-08-08 ENCOUNTER — Inpatient Hospital Stay: Payer: Medicare Other | Attending: Oncology

## 2021-08-08 VITALS — BP 152/70 | HR 80 | Temp 97.9°F | Resp 18 | Ht 73.0 in | Wt 248.1 lb

## 2021-08-08 DIAGNOSIS — C9110 Chronic lymphocytic leukemia of B-cell type not having achieved remission: Secondary | ICD-10-CM | POA: Diagnosis not present

## 2021-08-08 DIAGNOSIS — Z79899 Other long term (current) drug therapy: Secondary | ICD-10-CM | POA: Diagnosis not present

## 2021-08-08 DIAGNOSIS — Z8546 Personal history of malignant neoplasm of prostate: Secondary | ICD-10-CM | POA: Insufficient documentation

## 2021-08-08 DIAGNOSIS — R590 Localized enlarged lymph nodes: Secondary | ICD-10-CM | POA: Diagnosis not present

## 2021-08-08 LAB — CBC WITH DIFFERENTIAL (CANCER CENTER ONLY)
Abs Immature Granulocytes: 0.06 10*3/uL (ref 0.00–0.07)
Basophils Absolute: 0.1 10*3/uL (ref 0.0–0.1)
Basophils Relative: 1 %
Eosinophils Absolute: 0.3 10*3/uL (ref 0.0–0.5)
Eosinophils Relative: 1 %
HCT: 45.6 % (ref 39.0–52.0)
Hemoglobin: 15.2 g/dL (ref 13.0–17.0)
Immature Granulocytes: 0 %
Lymphocytes Relative: 59 %
Lymphs Abs: 13.6 10*3/uL — ABNORMAL HIGH (ref 0.7–4.0)
MCH: 28.3 pg (ref 26.0–34.0)
MCHC: 33.3 g/dL (ref 30.0–36.0)
MCV: 84.8 fL (ref 80.0–100.0)
Monocytes Absolute: 3.2 10*3/uL — ABNORMAL HIGH (ref 0.1–1.0)
Monocytes Relative: 14 %
Neutro Abs: 5.8 10*3/uL (ref 1.7–7.7)
Neutrophils Relative %: 25 %
Platelet Count: 212 10*3/uL (ref 150–400)
RBC: 5.38 MIL/uL (ref 4.22–5.81)
RDW: 14.4 % (ref 11.5–15.5)
WBC Count: 23.1 10*3/uL — ABNORMAL HIGH (ref 4.0–10.5)
nRBC: 0 % (ref 0.0–0.2)

## 2021-08-08 NOTE — Progress Notes (Signed)
Hematology and Oncology Follow Up Visit  [name] [id number] Sep 22, 1941 80 y.o. 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  He remains asymptomatic and does not require any treatment.   Secondary diagnosis:   Prostate cancer diagnosed in 2015.  He was found to have stage T1c, Gleason score 3+4 = 7 PSA of 6.8 and remains disease-free.  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  Prior Therapy: Status post robotic prostatectomy in February 2015 with the pathology revealed a Gleason score 3+4 = 7 without any evidence of extraprostatic extension.   He is a status post wide local excision and right axillary sentinel lymph node sampling completed on May 31, 2021 by Dr. Barry Dienes.  Current therapy: Active surveillance.  Interim History:  Mr. Blaschko is here for return evaluation.  Since her last visit, he underwent surgical resection of a right forearm skin neoplasm was found to be Merkel cell carcinoma.  His staging scan did not show any evidence of metastatic disease.  Clinically, he reports feeling well without any major complaints.  He has tolerated the surgery without any issues.  He denies any fevers or chills but does report occasional sweats.  He is ambulating without any major difficulties.     Medications: Updated on review. Current Outpatient Medications  Medication Sig Dispense Refill   amLODipine (NORVASC) 10 MG tablet Take 10 mg by mouth daily.     azelastine (ASTELIN) 0.1 % nasal spray Place 1 spray into both nostrils 2 (two) times daily. Use in each nostril as directed 30 mL 12   Cholecalciferol 50 MCG (2000 UT) TABS Take by mouth.     fluticasone (FLONASE) 50 MCG/ACT nasal spray Place into both nostrils.     glucose 4 GM chewable tablet Chew by mouth.     glucose blood test strip Use to test 4 times daily. E11.9 100 each 12    hydrochlorothiazide (HYDRODIURIL) 25 MG tablet Take 25 mg by mouth daily.     insulin aspart (NOVOLOG) 100 UNIT/ML injection Inject 30 Units into the skin 3 (three) times daily with meals. When sugar is elevated     insulin glargine (LANTUS) 100 UNIT/ML injection Inject 50 Units into the skin at bedtime.     lansoprazole (PREVACID) 30 MG capsule Take 1 capsule (30 mg total) by mouth daily at 12 noon. 30 capsule 5   levothyroxine (SYNTHROID, LEVOTHROID) 50 MCG tablet Take 50 mcg by mouth daily before breakfast.     losartan (COZAAR) 100 MG tablet Take 100 mg by mouth daily.     mirtazapine (REMERON) 15 MG tablet Take 15 mg by mouth at bedtime.     OVER THE COUNTER MEDICATION PreserVision for eyes     potassium chloride SA (K-DUR,KLOR-CON) 20 MEQ tablet Take 20 mEq by mouth daily.     Semaglutide (OZEMPIC, 0.25 OR 0.5 MG/DOSE, [place]) Inject 0.5 mg into the skin once a week.     simvastatin (ZOCOR) 80 MG tablet Take half tablet (40 mg) by mouth once daily     sucralfate (CARAFATE) 1 G tablet Take 1 g by mouth 4 (four) times daily -  with meals and at bedtime.     traMADol (ULTRAM) 50 MG tablet Take 1 tablet (50 mg total) by mouth every 6 (six) hours as needed. 10 tablet 0   No current facility-administered medications for this visit.  Allergies:  Allergies  Allergen Reactions   Morphine Sulfate Itching and Rash       Physical exam:   Blood pressure (!) 152/70, pulse 80, temperature 97.9 F (36.6 C), temperature source Tympanic, resp. rate 18, height '6\' 1"'$  (1.854 m), weight 248 lb 1.6 oz (112.5 kg), SpO2 99 %.   ECOG: 1    General appearance: Alert, awake without any distress. Head: Atraumatic without abnormalities Oropharynx: Without any thrush or ulcers. Eyes: No scleral icterus. Lymph nodes: No lymphadenopathy noted in the cervical, supraclavicular, or axillary nodes Heart:regular rate and rhythm, without any murmurs or gallops.   Lung: Clear to auscultation without any  rhonchi, wheezes or dullness to percussion. Abdomin: Soft, nontender without any shifting dullness or ascites. Musculoskeletal: No clubbing or cyanosis. Neurological: No motor or sensory deficits. Skin: No rashes or lesions.    .   Lab Results: Lab Results  Component Value Date   WBC 23.9 07/26/2021   HGB 15.8 07/26/2021   HCT 47 07/26/2021   MCV 84.3 05/29/2021   PLT 250 07/26/2021     Chemistry      Component Value Date/Time   NA 139 07/26/2021 0000   NA 142 10/30/2017 0845   K 4.1 07/26/2021 0000   K 3.7 10/30/2017 0845   CL 105 07/26/2021 0000   CO2 28 (A) 07/26/2021 0000   CO2 28 10/30/2017 0845   BUN 16 05/29/2021 1042   BUN 13 09/18/2020 0000   BUN 13.3 10/30/2017 0845   CREATININE 1.1 07/26/2021 0000   CREATININE 1.06 05/29/2021 1042   CREATININE 1.08 08/26/2019 0908   CREATININE 1.2 10/30/2017 0845   GLU 133 07/26/2021 0000      Component Value Date/Time   CALCIUM 9.3 05/29/2021 1042   CALCIUM 9.5 10/30/2017 0845   ALKPHOS 47 05/29/2021 1042   ALKPHOS 53 10/30/2017 0845   AST 31 05/29/2021 1042   AST 31 08/26/2019 0908   AST 37 (H) 10/30/2017 0845   ALT 34 05/29/2021 1042   ALT 29 08/26/2019 0908   ALT 37 10/30/2017 0845   BILITOT 1.9 (H) 05/29/2021 1042   BILITOT 1.3 (H) 08/26/2019 0908   BILITOT 1.23 (H) 10/30/2017 0845      IMPRESSION: 1. No evidence of Merkel cell carcinoma metastatic disease. 2. Postoperative seroma in the RIGHT axilla. 3. No hypermetabolic adenopathy. 4. No hypermetabolic cutaneous lesions. 5. Mild pelvic lymphadenopathy without radiotracer activity.   Impression and Plan:  80 year old man:    1.  CLL diagnosed in 2015.  He presented with leukocytosis and lymphadenopathy and has not required treatment at this time.     The natural course of this disease was reviewed at this time and treatment choices were reiterated.  He remains asymptomatic at this time without any evidence of bulky adenopathy.  PET scan  obtained in July 2022 showed very mild pelvic lymphadenopathy without any indication for treatment.   2.  Prostate cancer diagnosed in 2015.  He was found to have Gleason score of 3+4 = 7 in 2015.  No evidence of metastatic disease noted at this time continue to be on active surveillance.   3.  Merkel cell of the forearm diagnosed in June 2022.  He is status post surgical resection without any evidence of relapsed disease.  The natural course of this disease and treatment choices were reviewed.  The role for adjuvant radiation therapy as well as adjuvant systemic therapy were discussed.  At this time there is no indication for either  of those and continued monitoring is recommended.  Checkpoint inhibitors could be used in the future if he developed metastatic disease.   4. Follow-up: Return in 6 months for repeat evaluation.  30 minutes were spent on this visit.  The time was dedicated to updating his disease status, reviewing pathology results, imaging studies and future plan of care reviewed.    Zola Button, MD 9/7/20228:55 AM

## 2021-08-13 ENCOUNTER — Encounter (INDEPENDENT_AMBULATORY_CARE_PROVIDER_SITE_OTHER): Payer: Medicare Other | Admitting: Ophthalmology

## 2021-08-13 ENCOUNTER — Other Ambulatory Visit: Payer: Self-pay

## 2021-08-13 DIAGNOSIS — I10 Essential (primary) hypertension: Secondary | ICD-10-CM

## 2021-08-13 DIAGNOSIS — H35033 Hypertensive retinopathy, bilateral: Secondary | ICD-10-CM

## 2021-08-13 DIAGNOSIS — H353231 Exudative age-related macular degeneration, bilateral, with active choroidal neovascularization: Secondary | ICD-10-CM

## 2021-08-13 DIAGNOSIS — H35372 Puckering of macula, left eye: Secondary | ICD-10-CM | POA: Diagnosis not present

## 2021-08-13 DIAGNOSIS — H43813 Vitreous degeneration, bilateral: Secondary | ICD-10-CM

## 2021-08-25 DIAGNOSIS — Z23 Encounter for immunization: Secondary | ICD-10-CM | POA: Diagnosis not present

## 2021-08-26 ENCOUNTER — Encounter: Payer: Self-pay | Admitting: Family Medicine

## 2021-08-28 ENCOUNTER — Telehealth: Payer: Self-pay

## 2021-08-28 ENCOUNTER — Other Ambulatory Visit: Payer: Self-pay

## 2021-08-28 ENCOUNTER — Ambulatory Visit (INDEPENDENT_AMBULATORY_CARE_PROVIDER_SITE_OTHER): Payer: Medicare Other

## 2021-08-28 DIAGNOSIS — E538 Deficiency of other specified B group vitamins: Secondary | ICD-10-CM | POA: Diagnosis not present

## 2021-08-28 MED ORDER — CYANOCOBALAMIN 1000 MCG/ML IJ SOLN
1000.0000 ug | Freq: Once | INTRAMUSCULAR | Status: AC
  Administered 2021-08-28: 1000 ug via INTRAMUSCULAR

## 2021-08-28 NOTE — Telephone Encounter (Signed)
Good advice. Thank you

## 2021-08-28 NOTE — Progress Notes (Signed)
[name] Antolin 80 yr old male presents to office today for monthly B12 injection per [name], MD. Administered CYANOCOBALAMIN 1,000 mcg IM right arm. Patient tolerated well.

## 2021-08-28 NOTE — Telephone Encounter (Signed)
Patient seen today for monthly B12 shot. States he had his 3rd COVID booster on 08/25/21. C/O generalized weakness and low grade fever. Advised patient to keep up with fluid intake and to continue Tylenol for fevers. Advised to let us know over the next few days if symptoms do not improve.

## 2021-08-31 NOTE — Progress Notes (Signed)
Phone [phone] In person visit   Subjective:   [name] is a 80 y.o. year old very pleasant male patient who presents for/with See problem oriented charting Chief Complaint  Patient presents with   Hyperlipidemia   Hypertension   Hypothyroidism   Diabetes    This visit occurred during the SARS-CoV-2 public health emergency.  Safety protocols were in place, including screening questions prior to the visit, additional usage of staff PPE, and extensive cleaning of exam room while observing appropriate contact time as indicated for disinfecting solutions.   Past Medical History-  Patient Active Problem List   Diagnosis Date Noted   Chronic bilateral low back pain without sciatica 12/12/2015    Priority: 1.   Chronic lymphocytic leukemia ([place]) 05/16/2014    Priority: 1.   History of prostate cancer 01/24/2014    Priority: 1.   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   Diabetic retinopathy ([place]) 09/24/2007    Priority: 1.   Tremor 08/11/2015    Priority: 2.   Hypothyroidism     Priority: 2.   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   Insomnia 09/21/2009    Priority: 2.   Hyperlipidemia associated with type 2 diabetes mellitus ([place]) 09/24/2007    Priority: 2.   OSA on CPAP 09/24/2007    Priority: 2.   Hypertension associated with diabetes ([place]) 09/24/2007    Priority: 2.   Bell's palsy 05/08/2020    Priority: 3.   Personal history of skin cancer 04/02/2017    Priority: 3.   GERD (gastroesophageal reflux disease) 12/08/2014    Priority: 3.   History of colonic polyps 08/22/2008    Priority: 3.   Gynecomastia 12/28/2020   Vitamin D deficiency 11/01/2020   Senile purpura ([place]) 05/02/2020   Sacroiliac joint dysfunction 06/29/2018   Ear pressure, left 04/14/2017   Temporomandibular joint (TMJ) pain 04/14/2017   Pain syndrome, chronic 02/01/2016   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 myelopathy or  radiculopathy 02/01/2016    Medications- reviewed and updated Current Outpatient Medications  Medication Sig Dispense Refill   amLODipine (NORVASC) 10 MG tablet Take 10 mg by mouth daily.     azelastine (ASTELIN) 0.1 % nasal spray Place 1 spray into both nostrils 2 (two) times daily. Use in each nostril as directed 30 mL 12   Cholecalciferol 50 MCG (2000 UT) TABS Take by mouth.     fluticasone (FLONASE) 50 MCG/ACT nasal spray Place into both nostrils.     glucose 4 GM chewable tablet Chew by mouth.     glucose blood test strip Use to test 4 times daily. E11.9 100 each 12   hydrochlorothiazide (HYDRODIURIL) 25 MG tablet Take 25 mg by mouth daily.     insulin aspart (NOVOLOG) 100 UNIT/ML injection Inject 30 Units into the skin 3 (three) times daily with meals. When sugar is elevated     insulin glargine (LANTUS) 100 UNIT/ML injection Inject 50 Units into the skin at bedtime.     lansoprazole (PREVACID) 30 MG capsule Take 1 capsule (30 mg total) by mouth daily at 12 noon. 30 capsule 5   levothyroxine (SYNTHROID, LEVOTHROID) 50 MCG tablet Take 50 mcg by mouth daily before breakfast.     losartan (COZAAR) 100 MG tablet Take 100 mg by mouth daily.     mirtazapine (REMERON) 15 MG tablet Take 15 mg by mouth at bedtime.  OVER THE COUNTER MEDICATION PreserVision for eyes     potassium chloride SA (K-DUR,KLOR-CON) 20 MEQ tablet Take 20 mEq by mouth daily.     Semaglutide (OZEMPIC, 0.25 OR 0.5 MG/DOSE, [place]) Inject 0.5 mg into the skin once a week.     simvastatin (ZOCOR) 80 MG tablet Take half tablet (40 mg) by mouth once daily     sucralfate (CARAFATE) 1 G tablet Take 1 g by mouth 4 (four) times daily -  with meals and at bedtime.     traMADol (ULTRAM) 50 MG tablet Take 1 tablet (50 mg total) by mouth every 6 (six) hours as needed. 10 tablet 0   No current facility-administered medications for this visit.     Objective:  BP 122/75   Pulse 94   Temp 98.8 F (37.1 C) (Temporal)   Ht 6\' 1"   (1.854 m)   Wt 236 lb 12.8 oz (107.4 kg)   SpO2 96%   BMI 31.24 kg/m  Gen: NAD, resting comfortably No carotid bruit CV: RRR no murmurs rubs or gallops Lungs: CTAB no crackles, wheeze, rhonchi Ext: trace edema under compression stockings Skin: warm, dry Neuro: tremor now occurring even at rest with hands on legs bilaterl    Assessment and Plan   # covid vaccine- had bivalen vaccination and was in bed for a week, fever up to 101, sore throat. We will check for covid per their request but discussed this was likely an immune response. Lost appetite. Shot was Sunday and by Monday felt ill. Did just have a long drive before this trip.   #Hyperlipidemia S: Controlled on simvastatin 40 mg-half of 80 mg daily. [facility] also added gemfibrozil with triglycerides 379- was on fenofibrate  Lab Results  Component Value Date   CHOL 105 03/29/2021   HDL 42 03/29/2021   LDLCALC 11 03/29/2021   LDLDIRECT 47.0 11/05/2019   TRIG 258 (A) 03/29/2021   CHOLHDL 2 07/16/2017   A/P: Reasonable control-continue current medication. Triglycericides have been high- we will double check on femfibrozil to make sure he is taking- he is not 100% confident  #Hypertension S: Controlled on hydrochlorothiazide 25 mg daily, losartan 100 mg daily, amlodipine 10 mg daily(some edema in past) - was on propranolol for tremor lowered HR so he stopped thi BP Readings from Last 3 Encounters:  09/05/21 122/75  08/08/21 (!) 152/70  06/11/21 138/84  A/P: Controlled. Continue current medications.   #Diabetes type 2 with ophthalmic complications- lifestyle meter- [facility] adjusted lantus down due to lows S: Compliant with Lantus 65 units (as high as 100 in past)--> 50 units AND ozempic 0.5 mg. Also used short acting insulin 30 units before meals. A1c goal 7.5 or less. CBGs- improved with weight loss and not having any lows Lab Results  Component Value Date   HGBA1C 6.5 03/29/2021   HGBA1C 6.9 09/18/2020   HGBA1C 7.4 07/10/2020    A/P: with lower appetite recently after covid we opted to hold ozempic for 3 weeks then can restart at 0.5. update a1c- likely continue current meds  #Hypothyroidism S: Compliant with levothyroxine 50 mcg daily Lab Results  Component Value Date   TSH 1.38 07/26/2021  A/P: TSH recently checked and well-controlled-continue current medication  #B12 deficiency S: history of gastric bypass. have recommended b12 1000 mcg per day- he preferred injections. Still doing shots here Lab Results  Component Value Date   VITAMINB12 >1500 (H) 11/05/2019  A/P: hopefully stable- update b12 today. Continue current meds for now    #  Patient with known CLL-continued to follow with Dr. Alen Blew. Stable.  We will monitor CBC with labs today  #Insomnia-sleep maintenance insomnia reported last visit-was able to sleep from around 9 pM to 2 or 3 AM.  Reported increased anxiety last visit after surgery for Merkel cell carcinoma on right arm.  He was already on Remeron 15 mg.  We recommended adding melatonin 1 to 3 mg at bedtime.  We considered trazodone if not improving. Luckily he is doing better- sounds like he is now on generic ambien on alternate days as mirtazepine through [place]. Also had cpap adjusted.    #tremor- now noted at rest- will update neurology evaluation  Recommended follow up: Return in about 4 months (around 01/06/2022) for follow-up or sooner if needed.. Future Appointments  Date Time Provider [address]  09/10/2021  7:45 AM Hayden Pedro, MD [facility] None  09/11/2021  9:15 AM [name], MD [facility]  09/25/2021  9:15 AM [facility] NURSE [facility]  12/05/2021  1:30 PM [facility] CCM PHARMACIST [facility]  02/06/2022  9:00 AM [facility] LAB [facility] None  02/06/2022  9:30 AM Shadad, Mathis Dad, MD [facility] None    Lab/Order associations:   ICD-10-CM   1. Hypertension associated with diabetes ([place])  E11.59    I15.2     2. Hyperlipidemia associated with type 2 diabetes mellitus  ([place])  E11.69    E78.5     3. Type 2 diabetes mellitus with retinopathy of both eyes, with long-term current use of insulin, macular edema presence unspecified, unspecified retinopathy severity ([place])  E11.319    Z79.4     4. Hypothyroidism, unspecified type  E03.9     5. B12 deficiency  E53.8     6. CLL (chronic lymphocytic leukemia) ([facility])  C91.10     7. Tremor  R25.1      I,Jada Bradford,acting as a scribe for [name], MD.,have documented all relevant documentation on the behalf of [name], MD,as directed by  [name], MD while in the presence of [name], MD.  I, [name], MD, have reviewed all documentation for this visit. The documentation on 09/05/21 for the exam, diagnosis, procedures, and orders are all accurate and complete.   Return precautions advised.  [name], MD

## 2021-09-03 ENCOUNTER — Encounter: Payer: Self-pay | Admitting: Family Medicine

## 2021-09-05 ENCOUNTER — Encounter: Payer: Self-pay | Admitting: Family Medicine

## 2021-09-05 ENCOUNTER — Encounter: Payer: Self-pay | Admitting: Neurology

## 2021-09-05 ENCOUNTER — Other Ambulatory Visit: Payer: Self-pay

## 2021-09-05 ENCOUNTER — Ambulatory Visit (INDEPENDENT_AMBULATORY_CARE_PROVIDER_SITE_OTHER): Payer: Medicare Other | Admitting: Family Medicine

## 2021-09-05 VITALS — BP 122/75 | HR 94 | Temp 98.8°F | Ht 73.0 in | Wt 236.8 lb

## 2021-09-05 DIAGNOSIS — C9110 Chronic lymphocytic leukemia of B-cell type not having achieved remission: Secondary | ICD-10-CM

## 2021-09-05 DIAGNOSIS — E11319 Type 2 diabetes mellitus with unspecified diabetic retinopathy without macular edema: Secondary | ICD-10-CM | POA: Diagnosis not present

## 2021-09-05 DIAGNOSIS — E039 Hypothyroidism, unspecified: Secondary | ICD-10-CM | POA: Diagnosis not present

## 2021-09-05 DIAGNOSIS — E538 Deficiency of other specified B group vitamins: Secondary | ICD-10-CM

## 2021-09-05 DIAGNOSIS — I152 Hypertension secondary to endocrine disorders: Secondary | ICD-10-CM

## 2021-09-05 DIAGNOSIS — Z794 Long term (current) use of insulin: Secondary | ICD-10-CM | POA: Diagnosis not present

## 2021-09-05 DIAGNOSIS — E785 Hyperlipidemia, unspecified: Secondary | ICD-10-CM

## 2021-09-05 DIAGNOSIS — R5383 Other fatigue: Secondary | ICD-10-CM | POA: Diagnosis not present

## 2021-09-05 DIAGNOSIS — J029 Acute pharyngitis, unspecified: Secondary | ICD-10-CM | POA: Diagnosis not present

## 2021-09-05 DIAGNOSIS — R251 Tremor, unspecified: Secondary | ICD-10-CM

## 2021-09-05 DIAGNOSIS — E1159 Type 2 diabetes mellitus with other circulatory complications: Secondary | ICD-10-CM | POA: Diagnosis not present

## 2021-09-05 DIAGNOSIS — E1169 Type 2 diabetes mellitus with other specified complication: Secondary | ICD-10-CM

## 2021-09-05 LAB — CBC WITH DIFFERENTIAL/PLATELET
Basophils Absolute: 0 10*3/uL (ref 0.0–0.1)
Basophils Relative: 0.2 % (ref 0.0–3.0)
Eosinophils Absolute: 0 10*3/uL (ref 0.0–0.7)
Eosinophils Relative: 0.5 % (ref 0.0–5.0)
HCT: 43.6 % (ref 39.0–52.0)
Hemoglobin: 14.8 g/dL (ref 13.0–17.0)
Lymphocytes Relative: 54.7 % — ABNORMAL HIGH (ref 12.0–46.0)
Lymphs Abs: 5.2 10*3/uL — ABNORMAL HIGH (ref 0.7–4.0)
MCHC: 34 g/dL (ref 30.0–36.0)
MCV: 83.8 fl (ref 78.0–100.0)
Monocytes Absolute: 0.7 10*3/uL (ref 0.1–1.0)
Monocytes Relative: 7.9 % (ref 3.0–12.0)
Neutro Abs: 3.5 10*3/uL (ref 1.4–7.7)
Neutrophils Relative %: 36.7 % — ABNORMAL LOW (ref 43.0–77.0)
Platelets: 155 10*3/uL (ref 150.0–400.0)
RBC: 5.2 Mil/uL (ref 4.22–5.81)
RDW: 14.5 % (ref 11.5–15.5)
WBC: 9.5 10*3/uL (ref 4.0–10.5)

## 2021-09-05 LAB — COMPREHENSIVE METABOLIC PANEL
ALT: 25 U/L (ref 0–53)
AST: 29 U/L (ref 0–37)
Albumin: 4.1 g/dL (ref 3.5–5.2)
Alkaline Phosphatase: 49 U/L (ref 39–117)
BUN: 15 mg/dL (ref 6–23)
CO2: 27 mEq/L (ref 19–32)
Calcium: 9.1 mg/dL (ref 8.4–10.5)
Chloride: 101 mEq/L (ref 96–112)
Creatinine, Ser: 1.08 mg/dL (ref 0.40–1.50)
GFR: 65.93 mL/min (ref >60.00)
Glucose, Bld: 159 mg/dL — ABNORMAL HIGH (ref 70–99)
Potassium: 4 mEq/L (ref 3.5–5.1)
Sodium: 138 mEq/L (ref 135–145)
Total Bilirubin: 1.3 mg/dL — ABNORMAL HIGH (ref 0.2–1.2)
Total Protein: 6.4 g/dL (ref 6.0–8.3)

## 2021-09-05 LAB — HEMOGLOBIN A1C: Hgb A1c MFr Bld: 6.2 % (ref 4.6–6.5)

## 2021-09-05 LAB — VITAMIN B12: Vitamin B-12: 1138 pg/mL — ABNORMAL HIGH (ref 211–911)

## 2021-09-05 NOTE — Addendum Note (Signed)
Addended by: Linton Ham on: 09/05/2021 09:03 AM   Modules accepted: Orders

## 2021-09-05 NOTE — Patient Instructions (Addendum)
Health Maintenance Due  Topic Date Due   Zoster Vaccines- Shingrix (1 of 2)   - Decline shingles shot at this time until you feel better.   -Please consider getting at your local pharmacy and let us know the date you received.  Never done   OPHTHALMOLOGY EXAM   - Sign release of information at the check out desk for exam with Dr. Zigmund Daniel   01/02/2021   INFLUENZA VACCINE   - Please consider flu shot at the [place] and let us know the date when you receive.  07/02/2021    Double check to see if Gemfibrozil is in your medications. Let us know  Please hold off Ozempic for 3 weeks since you have a low appetite and update me on how you feel.  Team please test for covid under fatigue and sore throat before he leaves. We are not quarantining him because symptoms started with vaccine  Please stop by lab before you go If you have mychart- we will send your results within 3 business days of [place] receiving them.  If you do not have mychart- we will call you about results within 5 business days of [place] receiving them.  *please also note that you will see labs on mychart as soon as they post. I will later go in and write notes on them- will say "notes from Dr. Yong Channel"   Recommended follow up: Return in about 4 months (around 01/06/2022) for follow-up or sooner if needed.Marland Kitchen

## 2021-09-06 ENCOUNTER — Encounter: Payer: Self-pay | Admitting: Family Medicine

## 2021-09-06 LAB — SARS-COV-2, NAA 2 DAY TAT

## 2021-09-06 LAB — NOVEL CORONAVIRUS, NAA: SARS-CoV-2, NAA: DETECTED — AB

## 2021-09-10 ENCOUNTER — Other Ambulatory Visit: Payer: Self-pay

## 2021-09-10 ENCOUNTER — Encounter (INDEPENDENT_AMBULATORY_CARE_PROVIDER_SITE_OTHER): Payer: Medicare Other | Admitting: Ophthalmology

## 2021-09-10 DIAGNOSIS — H43813 Vitreous degeneration, bilateral: Secondary | ICD-10-CM | POA: Diagnosis not present

## 2021-09-10 DIAGNOSIS — H353231 Exudative age-related macular degeneration, bilateral, with active choroidal neovascularization: Secondary | ICD-10-CM | POA: Diagnosis not present

## 2021-09-10 DIAGNOSIS — H35033 Hypertensive retinopathy, bilateral: Secondary | ICD-10-CM

## 2021-09-10 DIAGNOSIS — I10 Essential (primary) hypertension: Secondary | ICD-10-CM

## 2021-09-11 ENCOUNTER — Ambulatory Visit (INDEPENDENT_AMBULATORY_CARE_PROVIDER_SITE_OTHER): Payer: Medicare Other | Admitting: Dermatology

## 2021-09-11 ENCOUNTER — Encounter: Payer: Self-pay | Admitting: Dermatology

## 2021-09-11 DIAGNOSIS — L821 Other seborrheic keratosis: Secondary | ICD-10-CM

## 2021-09-11 DIAGNOSIS — Z85821 Personal history of Merkel cell carcinoma: Secondary | ICD-10-CM

## 2021-09-12 ENCOUNTER — Encounter: Payer: Self-pay | Admitting: Family Medicine

## 2021-09-12 ENCOUNTER — Ambulatory Visit (INDEPENDENT_AMBULATORY_CARE_PROVIDER_SITE_OTHER): Payer: Medicare Other | Admitting: Family Medicine

## 2021-09-12 VITALS — BP 122/76 | HR 92 | Temp 98.7°F | Ht 73.0 in | Wt 229.6 lb

## 2021-09-12 DIAGNOSIS — I152 Hypertension secondary to endocrine disorders: Secondary | ICD-10-CM

## 2021-09-12 DIAGNOSIS — U071 COVID-19: Secondary | ICD-10-CM

## 2021-09-12 DIAGNOSIS — E11319 Type 2 diabetes mellitus with unspecified diabetic retinopathy without macular edema: Secondary | ICD-10-CM

## 2021-09-12 DIAGNOSIS — R531 Weakness: Secondary | ICD-10-CM | POA: Diagnosis not present

## 2021-09-12 DIAGNOSIS — R630 Anorexia: Secondary | ICD-10-CM | POA: Diagnosis not present

## 2021-09-12 DIAGNOSIS — Z794 Long term (current) use of insulin: Secondary | ICD-10-CM

## 2021-09-12 DIAGNOSIS — E1159 Type 2 diabetes mellitus with other circulatory complications: Secondary | ICD-10-CM

## 2021-09-12 DIAGNOSIS — E039 Hypothyroidism, unspecified: Secondary | ICD-10-CM

## 2021-09-12 LAB — COMPREHENSIVE METABOLIC PANEL
ALT: 24 U/L (ref 0–53)
AST: 22 U/L (ref 0–37)
Albumin: 4.6 g/dL (ref 3.5–5.2)
Alkaline Phosphatase: 56 U/L (ref 39–117)
BUN: 20 mg/dL (ref 6–23)
CO2: 28 mEq/L (ref 19–32)
Calcium: 9.6 mg/dL (ref 8.4–10.5)
Chloride: 103 mEq/L (ref 96–112)
Creatinine, Ser: 1.01 mg/dL (ref 0.40–1.50)
GFR: 71.44 mL/min (ref >60.00)
Glucose, Bld: 136 mg/dL — ABNORMAL HIGH (ref 70–99)
Potassium: 4 mEq/L (ref 3.5–5.1)
Sodium: 141 mEq/L (ref 135–145)
Total Bilirubin: 1.6 mg/dL — ABNORMAL HIGH (ref 0.2–1.2)
Total Protein: 7.1 g/dL (ref 6.0–8.3)

## 2021-09-12 LAB — CBC WITH DIFFERENTIAL/PLATELET
Basophils Absolute: 0 10*3/uL (ref 0.0–0.1)
Basophils Relative: 0.3 % (ref 0.0–3.0)
Eosinophils Absolute: 0 10*3/uL (ref 0.0–0.7)
Eosinophils Relative: 0.3 % (ref 0.0–5.0)
HCT: 45.9 % (ref 39.0–52.0)
Hemoglobin: 15.1 g/dL (ref 13.0–17.0)
Lymphocytes Relative: 61.4 % — ABNORMAL HIGH (ref 12.0–46.0)
Lymphs Abs: 8.1 10*3/uL — ABNORMAL HIGH (ref 0.7–4.0)
MCHC: 33 g/dL (ref 30.0–36.0)
MCV: 83.2 fl (ref 78.0–100.0)
Monocytes Absolute: 0.7 10*3/uL (ref 0.1–1.0)
Monocytes Relative: 5 % (ref 3.0–12.0)
Neutro Abs: 4.4 10*3/uL (ref 1.4–7.7)
Neutrophils Relative %: 33 % — ABNORMAL LOW (ref 43.0–77.0)
Platelets: 236 10*3/uL (ref 150.0–400.0)
RBC: 5.52 Mil/uL (ref 4.22–5.81)
RDW: 14.7 % (ref 11.5–15.5)
WBC: 13.2 10*3/uL — ABNORMAL HIGH (ref 4.0–10.5)

## 2021-09-12 LAB — TSH: TSH: 1.11 u[IU]/mL (ref 0.35–5.50)

## 2021-09-12 MED ORDER — ONDANSETRON 4 MG PO TBDP: 4.0000 mg | ORAL_TABLET | Freq: Three times a day (TID) | ORAL | 0 refills | Status: DC | PRN

## 2021-09-12 NOTE — Progress Notes (Signed)
Phone [phone] In person visit   Subjective:   [name] is a 80 y.o. year old very pleasant male patient who presents for/with See problem oriented charting Chief Complaint  Patient presents with   Fatigue    2 weeks    Anorexia    Eating very little she said    Shortness of Breath    Patient states that he's having a heard time breathing     This visit occurred during the SARS-CoV-2 public health emergency.  Safety protocols were in place, including screening questions prior to the visit, additional usage of staff PPE, and extensive cleaning of exam room while observing appropriate contact time as indicated for disinfecting solutions.   Past Medical History-  Patient Active Problem List   Diagnosis Date Noted   Chronic bilateral low back pain without sciatica 12/12/2015    Priority: 1.   Chronic lymphocytic leukemia ([place]) 05/16/2014    Priority: 1.   History of prostate cancer 01/24/2014    Priority: 1.   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   Diabetic retinopathy ([place]) 09/24/2007    Priority: 1.   Tremor 08/11/2015    Priority: 2.   Hypothyroidism     Priority: 2.   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   Insomnia 09/21/2009    Priority: 2.   Hyperlipidemia associated with type 2 diabetes mellitus ([place]) 09/24/2007    Priority: 2.   OSA on CPAP 09/24/2007    Priority: 2.   Hypertension associated with diabetes ([place]) 09/24/2007    Priority: 2.   Bell's palsy 05/08/2020    Priority: 3.   Personal history of skin cancer 04/02/2017    Priority: 3.   GERD (gastroesophageal reflux disease) 12/08/2014    Priority: 3.   History of colonic polyps 08/22/2008    Priority: 3.   Gynecomastia 12/28/2020   Vitamin D deficiency 11/01/2020   Senile purpura ([place]) 05/02/2020   Sacroiliac joint dysfunction 06/29/2018   Ear pressure, left 04/14/2017   Temporomandibular joint (TMJ) pain 04/14/2017    Pain syndrome, chronic 02/01/2016   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 myelopathy or radiculopathy 02/01/2016    Medications- reviewed and updated Current Outpatient Medications  Medication Sig Dispense Refill   amLODipine (NORVASC) 10 MG tablet Take 10 mg by mouth daily.     azelastine (ASTELIN) 0.1 % nasal spray Place 1 spray into both nostrils 2 (two) times daily. Use in each nostril as directed 30 mL 12   Cholecalciferol 50 MCG (2000 UT) TABS Take by mouth.     fluticasone (FLONASE) 50 MCG/ACT nasal spray Place into both nostrils.     glucose 4 GM chewable tablet Chew by mouth.     glucose blood test strip Use to test 4 times daily. E11.9 100 each 12   hydrochlorothiazide (HYDRODIURIL) 25 MG tablet Take 25 mg by mouth daily.     insulin aspart (NOVOLOG) 100 UNIT/ML injection Inject 30 Units into the skin 3 (three) times daily with meals. When sugar is elevated     insulin glargine (LANTUS) 100 UNIT/ML injection Inject 50 Units into the skin at bedtime.     lansoprazole (PREVACID) 30 MG capsule Take 1 capsule (30 mg total) by mouth daily at 12 noon. 30 capsule 5   levothyroxine (SYNTHROID, LEVOTHROID) 50 MCG tablet Take 50 mcg by mouth daily before breakfast.     losartan (COZAAR) 100 MG  tablet Take 100 mg by mouth daily.     mirtazapine (REMERON) 15 MG tablet Take 15 mg by mouth at bedtime.     ondansetron (ZOFRAN ODT) 4 MG disintegrating tablet Take 1 tablet (4 mg total) by mouth every 8 (eight) hours as needed for nausea or vomiting. 20 tablet 0   OVER THE COUNTER MEDICATION PreserVision for eyes     potassium chloride SA (K-DUR,KLOR-CON) 20 MEQ tablet Take 20 mEq by mouth daily.     simvastatin (ZOCOR) 80 MG tablet Take half tablet (40 mg) by mouth once daily     sucralfate (CARAFATE) 1 G tablet Take 1 g by mouth 4 (four) times daily -  with meals and at bedtime.     traMADol (ULTRAM) 50 MG tablet Take 1 tablet (50 mg total) by mouth every 6 (six) hours as needed. 10 tablet 0   No  current facility-administered medications for this visit.     Objective:  BP 122/76   Pulse 92   Temp 98.7 F (37.1 C) (Temporal)   Ht 6\' 1"  (1.854 m)   Wt 229 lb 9.6 oz (104.1 kg)   SpO2 98%   BMI 30.29 kg/m  Gen: NAD, resting comfortably Dry mucous membranes CV: RRR no murmurs rubs or gallops Lungs: CTAB no crackles, wheeze, rhonchi Ext: Baseline edema Skin: warm, dry Neuro: Baseline tremor noted    Assessment and Plan   #COVID-19-weakness/loss of appetite S:2 weeks of symptoms likely from COVID-19.  Patient actually started with symptoms after getting  bivalent vaccination-was in bed for a week before prior episode to see me (symptoms were not reported prior to visit which also included fever, sore throat-we initially thought this could simply be an immune response) patient is not eating much and has been losing weight recently. Also reported his nerves are not great. Down 7 lbs in a week and from July down 17 lbs. Low appetite- feels nausea.  States when he gets anxious he gets short of breath-actually took an old benzodiazepine and felt significant relief of shortness of breath sensation.  No significant cough or congestion.  Main issue is low appetite -on mirtazapine from the [place] but appetite still low - no anti nausea meds -did do some ensure and ginger ale recently -test still positive yesterday for covid. Wife negative A/P: Wife is extremely concerned about patient and would like for him to go immediately to the hospital.  This has been extremely stressful for wife try to care for him at home for the last 2 weeks.  She cannot get him to eat or drink sufficient quantities he continues to lose significant amount of weight.  On exam patient does appear dehydrated with dry tongue and mucous membranes.  Patient declines hospitalization at this time but does agree to push fluids at [facility] after visit summary.  We will trial ondansetron to see if this will help him with nausea and  potentially stimulate appetite by removing he has a barrier.  Multiple indications for proceeding to the hospital listed on after visit summary.  We will update blood work in the room and discussed if significant hydration with AKI potential hospitalization as well-blood pressure looks good so we will hold hydrochlorothiazide for now to reduce risk of dehydration  Check TSH with weight loss as well  #Diabetes type 2 with ophthalmic complications- lifestyle meter- va adjusted lantus down due to lows S: Compliant with Lantus 65 units (as high as 100 in past)--> 50 units Also used short  acting insulin 30 units before meals. A1c goal 7.5 or less. - NOT taking ozempic since last visit as instructed but continuing to lose weight -sugars as low as 80s- nothing under 70s Lab Results  Component Value Date   HGBA1C 6.2 09/05/2021   HGBA1C 6.5 03/29/2021   HGBA1C 6.9 09/18/2020    A/P: Thankfully no true lows-continue to hold Ozempic as want to avoid any barriers to increasing appetite.  Continue insulin-he should update [place] if any lows  #hypertension S: medication: amlodipine 10 mg, hctz 25 mg BP Readings from Last 3 Encounters:  09/12/21 122/76  09/05/21 122/75  08/08/21 (!) 152/70  A/P: Well-controlled-hold hydrochlorothiazide unless blood pressure at home greater than 150/90 and then only take half tablet to help avoid dehydration   Recommended follow up: Return for as needed for new, worsening, persistent symptoms.  Once again ER precautions reviewed Future Appointments  Date Time Provider [place]  09/24/2021  9:15 AM Tat, Eustace Quail, DO [facility] None  09/25/2021  9:15 AM [facility] NURSE [facility] PEC  10/08/2021  7:30 AM Hayden Pedro, MD [facility] None  12/05/2021  1:30 PM [facility] CCM PHARMACIST [facility]EC  12/21/2021  8:40 AM [name], MD [facility] PEC  02/06/2022  9:00 AM [facility] LAB [facility] None  02/06/2022  9:30 AM Wyatt Portela, MD [facility] None  09/16/2022   9:15 AM [name], MD [facility]    Lab/Order associations:   ICD-10-CM   1. COVID-19  U07.1     2. Weakness  R53.1 CBC with Differential/Platelet    Comprehensive metabolic panel    3. Loss of appetite  R63.0     4. Type 2 diabetes mellitus with retinopathy of both eyes, with long-term current use of insulin, macular edema presence unspecified, unspecified retinopathy severity ([place])  E11.319    Z79.4     5. Hypothyroidism, unspecified type  E03.9 TSH    6. Hypertension associated with diabetes ([place])  E11.59 CBC with Differential/Platelet   I15.2 Comprehensive metabolic panel      Meds ordered this encounter  Medications   ondansetron (ZOFRAN ODT) 4 MG disintegrating tablet    Sig: Take 1 tablet (4 mg total) by mouth every 8 (eight) hours as needed for nausea or vomiting.    Dispense:  20 tablet    Refill:  0   I,Jada Bradford,acting as a scribe for [name], MD.,have documented all relevant documentation on the behalf of [name], MD,as directed by  [name], MD while in the presence of [name], MD.   I, [name], MD, have reviewed all documentation for this visit. The documentation on 09/12/21 for the exam, diagnosis, procedures, and orders are all accurate and complete.   Return precautions advised.  [name], MD

## 2021-09-12 NOTE — Telephone Encounter (Signed)
Patient has been scheduled

## 2021-09-12 NOTE — Patient Instructions (Addendum)
60 oz measured out each day at least and if you do not get to this you agreed to go to hospital  You also agreed to hospital if significant dehydration on labs- hoping to have info to you by tomorrow morning at latest  We will try an anti nausea medicine to see if this helps. Take this and works for about 8 hours so if you took now may help through dinner.   Hold hydrochlorothiazide 25 mg unless blood pressure gets above 150/90- then take half  listen to wife and work with her on food/drink or go to the hospital.  Recommended follow up: Return for as needed for new, worsening, persistent symptoms.

## 2021-09-18 DIAGNOSIS — M47816 Spondylosis without myelopathy or radiculopathy, lumbar region: Secondary | ICD-10-CM | POA: Diagnosis not present

## 2021-09-18 DIAGNOSIS — Z79899 Other long term (current) drug therapy: Secondary | ICD-10-CM | POA: Diagnosis not present

## 2021-09-18 DIAGNOSIS — G894 Chronic pain syndrome: Secondary | ICD-10-CM | POA: Diagnosis not present

## 2021-09-18 DIAGNOSIS — Z5181 Encounter for therapeutic drug level monitoring: Secondary | ICD-10-CM | POA: Diagnosis not present

## 2021-09-19 ENCOUNTER — Encounter: Payer: Self-pay | Admitting: Family Medicine

## 2021-09-19 NOTE — Progress Notes (Signed)
Assessment/Plan:   idiopathic Parkinson's disease.  The patient has tremor, bradykinesia, rigidity and mild postural instability. -pt apparently with hx of ET.  Didn't see much ET today.  Has had propranolol with SE in past.  -We discussed the diagnosis as well as pathophysiology of the disease.  We discussed treatment options as well as prognostic indicators.  Patient education was provided.  -We discussed that it used to be thought that levodopa would increase risk of melanoma but now it is believed that Parkinsons itself likely increases risk of melanoma. he is to get regular skin checks.  I can see that he does follow regularly with Dr. Denna Haggard  -We decided to add carbidopa/levodopa 25/100.  1/2 tab tid x 1 wk, then 1/2 in am & noon & 1 at night for a week, then 1/2 in am &1 at noon &night for a week, then 1 po tid.  Risks, benefits, side effects and alternative therapies were discussed.  The opportunity to ask questions was given and they were answered to the best of my ability.  The patient expressed understanding and willingness to follow the outlined treatment protocols.  -Patient declined physical therapy, although I strongly encouraged it.  He can let me know if he changes his mind.  -We discussed community resources in the area including patient support groups and community exercise programs for PD and pt education was provided to the patient.  Patient declined community resources.  -discussed VA benefits for Parkinsons Disease   -he declines MRI brain.  Wife is frustrated with that and patient thinks that he had one at [place] [facility].  If he has had one, I asked them to get me a copy of that.  -Second opinion was offered, as it was clear today that the patient was stunned and upset by the diagnosis.  I told him he can let me know if he would like me to send a referral for a second opinion, or he could talk with his primary care physician about that.  -Patient met with my LCSW  today.  Subjective:   [name] was seen today in the movement disorders clinic for neurologic consultation at the request of [name], MD.  The consultation is for the evaluation of tremor.  This patient is accompanied in the office by his spouse who supplements the history.Patient saw Dr. Posey Pronto for the same thing back in May, 2018.  Dr. Posey Pronto felt that this was most consistent with essential tremor.  She started the patient on propranolol, 40 mg twice daily.  Noted that patient was on this medication until October 31, 2020.  It was stopped after his heart rate was getting low (stopped by patient - pt states today he stopped it b/c his pastor told him it was bad for the heart).  More recently, the patient followed up with primary care on September 05, 2021.  Patient was status post COVID.  Had developed a resting tremor and referral was to reevaluate the tremor.  Tremor: Yes.     How long has it been going on? At least since 2018  At rest or with activation?  activation  Fam hx of tremor?  Yes.  ; paternal GF, paternal uncle; cousin on fathers side (dx was just tremor)  Located where?  R>>L hand ; no tremor in the legs  Affected by caffeine:  unknown  Affected by alcohol: doesn't drink alcohol  Affected by stress:  Yes.    Affected by  fatigue:  No.  Spills soup if on spoon:  Yes.    Spills glass of liquid if full:  Yes.  , uses both hands to hold cups or uses the L hand or uses lids   Other Specific Symptoms:  Voice: no per pt; more slurred now but pt thinks its dentures Sleep: trouble staying asleep (gets up to urinate)  Vivid Dreams:  No.  Acting out dreams:  No. Wet Pillows: No. Postural symptoms:  Yes.  , "shaky when I first get up"; walker x 10 years after fell off ladder and broke leg (R leg)  Falls?  No. (None since fall off ladder 10 years ago) Bradykinesia symptoms: shuffling gait - "I've got the [employer] shuffle"; pushes off to arise from chair Loss of smell:   Yes.   Loss of taste:  No. Urinary Incontinence:  No. Difficulty Swallowing:  No. Handwriting, micrographia: unknown Trouble with ADL's:  No.  Trouble buttoning clothing: No. Depression:  No. But admits to anxiety Memory changes:  No. (Doesn't drive due to macular degeneration) Hallucinations:  No.  visual distortions: No. N/V:  No. Lightheaded:  Yes.    Syncope: No. Diplopia:  No. Dyskinesia:  No.  Neuroimaging of the brain has previously been performed at the [place].  It is not available for my review today.  PREVIOUS MEDICATIONS: propranolol  ALLERGIES:   Allergies  Allergen Reactions   Morphine Sulfate Itching and Rash    CURRENT MEDICATIONS:  Current Outpatient Medications  Medication Instructions   amLODipine (NORVASC) 10 mg, Oral, Daily   azelastine (ASTELIN) 0.1 % nasal spray 1 spray, Each Nare, 2 times daily, Use in each nostril as directed   Cholecalciferol 50 MCG (2000 UT) TABS Oral   fluticasone (FLONASE) 50 MCG/ACT nasal spray Each Nare   glucose 4 GM chewable tablet Oral   glucose blood test strip Use to test 4 times daily. E11.9   hydrochlorothiazide (HYDRODIURIL) 25 mg, Oral, Daily   insulin aspart (NOVOLOG) 30 Units, Subcutaneous, 3 times daily with meals, When sugar is elevated   insulin glargine (LANTUS) 50 Units, Subcutaneous, Daily at bedtime   lansoprazole (PREVACID) 30 mg, Oral, Daily   levothyroxine (SYNTHROID) 50 mcg, Oral, Daily before breakfast   losartan (COZAAR) 100 mg, Oral, Daily   mirtazapine (REMERON) 15 mg, Daily at bedtime   ondansetron (ZOFRAN ODT) 4 mg, Oral, Every 8 hours PRN   OVER THE COUNTER MEDICATION PreserVision for eyes    potassium chloride SA (K-DUR,KLOR-CON) 20 MEQ tablet 20 mEq, Daily   simvastatin (ZOCOR) 80 MG tablet Take half tablet (40 mg) by mouth once daily   sucralfate (CARAFATE) 1 g, Oral, 3 times daily with meals & bedtime   traMADol (ULTRAM) 50 mg, Oral, Every 6 hours PRN    Objective:   PHYSICAL EXAMINATION:     VITALS:   Vitals:   09/24/21 0853  BP: 126/64  Pulse: 96  SpO2: 95%  Weight: 232 lb 12.8 oz (105.6 kg)  Height: 6' (1.829 m)    GEN:  The patient appears stated age and is in NAD. HEENT:  Normocephalic, atraumatic.  The mucous membranes are moist. The superficial temporal arteries are without ropiness or tenderness. CV:  RRR Lungs:  CTAB Neck/HEME:  There are no carotid bruits bilaterally.  Neurological examination:  Orientation: The patient is alert and oriented x3.  Cranial nerves: There is good facial symmetry.  There was facial hypomimia.  Extraocular muscles are intact. The visual fields are full to confrontational  testing. The speech is fluent and clear. Soft palate rises symmetrically and there is no tongue deviation. Hearing is decreased to conversational tone. Sensation: Sensation is intact to light touch throughout (facial, trunk, extremities). Vibration is intact at the bilateral big toe. There is no extinction with double simultaneous stimulation.  Motor: Strength is 5/5 in the bilateral upper and lower extremities.   Shoulder shrug is equal and symmetric.  There is no pronator drift. Deep tendon reflexes: Deep tendon reflexes are 0-1/4 at the bilateral biceps, triceps, brachioradialis, patella and achilles. Plantar responses are downgoing bilaterally.  Movement examination: Tone: There is mild increased tone in the RUE Abnormal movements: there is RUE rest tremor, moderate, and very mild left upper extremity rest tremor, intermittent Coordination:  There is mild decremation with RAM's, with any form of RAMS, including alternating supination and pronation of the forearm, hand opening and closing, finger taps, heel taps and toe taps, r>L Gait and Station: The patient pushes off of the chair to arise. The patient's stride length is just slightly decreased and slightly drags the R leg.  He does not shuffle today. I have reviewed and interpreted the following labs  independently   Chemistry      Component Value Date/Time   NA 141 09/12/2021 1217   NA 139 07/26/2021 0000   NA 139 07/26/2021 0000   NA 142 10/30/2017 0845   K 4.0 09/12/2021 1217   K 3.7 10/30/2017 0845   CL 103 09/12/2021 1217   CO2 28 09/12/2021 1217   CO2 28 10/30/2017 0845   BUN 20 09/12/2021 1217   BUN 13 09/18/2020 0000   BUN 13.3 10/30/2017 0845   CREATININE 1.01 09/12/2021 1217   CREATININE 1.08 08/26/2019 0908   CREATININE 1.2 10/30/2017 0845   GLU 133 07/26/2021 0000      Component Value Date/Time   CALCIUM 9.6 09/12/2021 1217   CALCIUM 9.5 10/30/2017 0845   ALKPHOS 56 09/12/2021 1217   ALKPHOS 53 10/30/2017 0845   AST 22 09/12/2021 1217   AST 31 08/26/2019 0908   AST 37 (H) 10/30/2017 0845   ALT 24 09/12/2021 1217   ALT 29 08/26/2019 0908   ALT 37 10/30/2017 0845   BILITOT 1.6 (H) 09/12/2021 1217   BILITOT 1.3 (H) 08/26/2019 0908   BILITOT 1.23 (H) 10/30/2017 0845      Lab Results  Component Value Date   TSH 1.11 09/12/2021   Lab Results  Component Value Date   WBC 13.2 (H) 09/12/2021   HGB 15.1 09/12/2021   HCT 45.9 09/12/2021   MCV 83.2 09/12/2021   PLT 236.0 09/12/2021      Total time spent on today's visit was 62 minutes, including both face-to-face time and nonface-to-face time.  Time included that spent on review of records (prior notes available to me/labs/imaging if pertinent), discussing treatment and goals, answering patient's questions and coordinating care.  Cc:  [name], MD

## 2021-09-24 ENCOUNTER — Ambulatory Visit (INDEPENDENT_AMBULATORY_CARE_PROVIDER_SITE_OTHER): Payer: Medicare Other | Admitting: Neurology

## 2021-09-24 ENCOUNTER — Other Ambulatory Visit: Payer: Self-pay

## 2021-09-24 ENCOUNTER — Encounter: Payer: Self-pay | Admitting: Neurology

## 2021-09-24 VITALS — BP 126/64 | HR 96 | Ht 72.0 in | Wt 232.8 lb

## 2021-09-24 DIAGNOSIS — G20 Parkinson's disease: Secondary | ICD-10-CM | POA: Diagnosis not present

## 2021-09-24 MED ORDER — CARBIDOPA-LEVODOPA 25-100 MG PO TABS: 1.0000 | ORAL_TABLET | Freq: Three times a day (TID) | ORAL | 0 refills | Status: DC

## 2021-09-24 MED ORDER — CARBIDOPA-LEVODOPA 25-100 MG PO TABS: 1.0000 | ORAL_TABLET | Freq: Three times a day (TID) | ORAL | 1 refills | Status: DC

## 2021-09-24 NOTE — Patient Instructions (Signed)
Start Carbidopa Levodopa as follows: Take 1/2 tablet three times daily, at least 30 minutes before meals (approximately 6am/10am/2pm), for one week Then take 1/2 tablet in the morning, 1/2 tablet in the afternoon, 1 tablet in the evening, at least 30 minutes before meals, for one week Then take 1/2 tablet in the morning, 1 tablet in the afternoon, 1 tablet in the evening, at least 30 minutes before meals, for one week Then take 1 tablet three times daily at 6am/10am/2pm, at least 30 minutes before meals   As a reminder, carbidopa/levodopa can be taken at the same time as a carbohydrate, but we like to have you take your pill either 30 minutes before a protein source or 1 hour after as protein can interfere with carbidopa/levodopa absorption.

## 2021-09-25 ENCOUNTER — Ambulatory Visit (INDEPENDENT_AMBULATORY_CARE_PROVIDER_SITE_OTHER): Payer: Medicare Other

## 2021-09-25 DIAGNOSIS — E538 Deficiency of other specified B group vitamins: Secondary | ICD-10-CM | POA: Diagnosis not present

## 2021-09-25 MED ORDER — CYANOCOBALAMIN 1000 MCG/ML IJ SOLN
1000.0000 ug | Freq: Once | INTRAMUSCULAR | Status: AC
  Administered 2021-09-25: 1000 ug via INTRAMUSCULAR

## 2021-09-25 NOTE — Progress Notes (Signed)
Per orders of Dr. Yong Channel, injection of B-12 given by Tobe Sos in left deltoid. Patient tolerated injection well. Patient will make appointment for 1 month.

## 2021-09-26 ENCOUNTER — Encounter: Payer: Self-pay | Admitting: Dermatology

## 2021-09-26 NOTE — Progress Notes (Signed)
   Follow-Up Visit   Subjective  [name] is a 80 y.o. male who presents for the following: Follow-up (Patient seen Dr Barry Dienes for fright forearm lymph nodes were taken at the visit. Patient has new spot post forearm on the left today).  Check site of Merkel cell surgery, and new spots on the left arm and back. Location:  Duration:  Quality:  Associated Signs/Symptoms: Modifying Factors:  Severity:  Timing: Context:   Objective  Well appearing patient in no apparent distress; mood and affect are within normal limits. Left Forearm - Posterior, Mid Back 5 mm textured brown flattopped papules, typical dermoscopy.  Right Forearm - Posterior No sign of recurrence along the excision site.  No regional adenopathy.    All skin waist up examined.   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 Skin exams yearly.  Detailed discussion with spouse about the risks of recurrent Merkel cell.      I, [name], MD, have reviewed all documentation for this visit.  The documentation on 09/26/21 for the exam, diagnosis, procedures, and orders are all accurate and complete.

## 2021-10-08 ENCOUNTER — Other Ambulatory Visit: Payer: Self-pay

## 2021-10-08 ENCOUNTER — Encounter (INDEPENDENT_AMBULATORY_CARE_PROVIDER_SITE_OTHER): Payer: Medicare Other | Admitting: Ophthalmology

## 2021-10-08 DIAGNOSIS — H353231 Exudative age-related macular degeneration, bilateral, with active choroidal neovascularization: Secondary | ICD-10-CM

## 2021-10-08 DIAGNOSIS — H43813 Vitreous degeneration, bilateral: Secondary | ICD-10-CM

## 2021-10-08 DIAGNOSIS — I10 Essential (primary) hypertension: Secondary | ICD-10-CM | POA: Diagnosis not present

## 2021-10-08 DIAGNOSIS — H35033 Hypertensive retinopathy, bilateral: Secondary | ICD-10-CM | POA: Diagnosis not present

## 2021-10-11 ENCOUNTER — Encounter: Payer: Self-pay | Admitting: Family Medicine

## 2021-10-11 NOTE — Telephone Encounter (Signed)
Noted  

## 2021-10-11 NOTE — Telephone Encounter (Signed)
See if pt will be willing to do a virtual today at 4:40 pm.

## 2021-10-11 NOTE — Telephone Encounter (Signed)
Patient's wife is calling in stating that she wants [name] worked in today, call back number is  [phone]. Can we work patient in today?

## 2021-10-11 NOTE — Telephone Encounter (Signed)
Ok to work pt in today?

## 2021-10-11 NOTE — Telephone Encounter (Signed)
LVM to call back.

## 2021-10-11 NOTE — Telephone Encounter (Signed)
[name] called in stating she is going to take patient to [facility] for evaluation today.

## 2021-10-19 ENCOUNTER — Telehealth: Payer: Self-pay | Admitting: Pharmacist

## 2021-10-19 NOTE — Chronic Care Management (AMB) (Signed)
Chronic Care Management Pharmacy Assistant   Name: [name]  MRN: [id number] DOB: 1941-05-20   Reason for Encounter: Diabetes Adherence Call    Recent office visits:  09/05/2021 OV (PCP) [name], MD;  with lower appetite recently after covid we opted to hold ozempic for 3 weeks then can restart at 0.5. update a1c- likely continue current meds  Recent consult visits:  09/24/2021 OV (neurology) Tat, Eustace Quail, DO; Take 1/2 tablet three times daily, at least 30 minutes before meals (approximately 6am/10am/2pm), for one week -Then take 1/2 tablet in the morning, 1/2 tablet in the afternoon, 1 tablet in the evening, at least 30 minutes before meals, for one week -Then take 1/2 tablet in the morning, 1 tablet in the afternoon, 1 tablet in the evening, at least 30 minutes before meals, for one week -Then take 1 tablet three times daily at 6am/10am/2pm, at least 30 minutes before meals  09/11/2021 OV (dermatology) [name], MD; no medication changes indicated.  08/08/2021 OV (oncology) Wyatt Portela, MD; no medication changes indicated.  Hospital visits:  None in previous 6 months  Medications: Outpatient Encounter Medications as of 10/19/2021  Medication Sig   amLODipine (NORVASC) 10 MG tablet Take 10 mg by mouth daily.   azelastine (ASTELIN) 0.1 % nasal spray Place 1 spray into both nostrils 2 (two) times daily. Use in each nostril as directed   carbidopa-levodopa (SINEMET IR) 25-100 MG tablet Take 1 tablet by mouth 3 (three) times daily. 6am/10am/2pm   carbidopa-levodopa (SINEMET IR) 25-100 MG tablet Take 1 tablet by mouth 3 (three) times daily.   Cholecalciferol 50 MCG (2000 UT) TABS Take by mouth.   fluticasone (FLONASE) 50 MCG/ACT nasal spray Place into both nostrils.   glucose 4 GM chewable tablet Chew by mouth.   glucose blood test strip Use to test 4 times daily. E11.9   hydrochlorothiazide (HYDRODIURIL) 25 MG tablet Take 25 mg by mouth daily.    insulin aspart (NOVOLOG) 100 UNIT/ML injection Inject 30 Units into the skin 3 (three) times daily with meals. When sugar is elevated   insulin glargine (LANTUS) 100 UNIT/ML injection Inject 50 Units into the skin at bedtime.   lansoprazole (PREVACID) 30 MG capsule Take 1 capsule (30 mg total) by mouth daily at 12 noon.   levothyroxine (SYNTHROID, LEVOTHROID) 50 MCG tablet Take 50 mcg by mouth daily before breakfast.   losartan (COZAAR) 100 MG tablet Take 100 mg by mouth daily.   mirtazapine (REMERON) 15 MG tablet Take 15 mg by mouth at bedtime.   ondansetron (ZOFRAN ODT) 4 MG disintegrating tablet Take 1 tablet (4 mg total) by mouth every 8 (eight) hours as needed for nausea or vomiting.   OVER THE COUNTER MEDICATION PreserVision for eyes   potassium chloride SA (K-DUR,KLOR-CON) 20 MEQ tablet Take 20 mEq by mouth daily.   simvastatin (ZOCOR) 80 MG tablet Take half tablet (40 mg) by mouth once daily   sucralfate (CARAFATE) 1 G tablet Take 1 g by mouth 4 (four) times daily -  with meals and at bedtime.   traMADol (ULTRAM) 50 MG tablet Take 1 tablet (50 mg total) by mouth every 6 (six) hours as needed.   No facility-administered encounter medications on file as of 10/19/2021.   Recent Relevant Labs: Lab Results  Component Value Date/Time   HGBA1C 6.2 09/05/2021 09:07 AM   HGBA1C 6.5 03/29/2021 12:00 AM   HGBA1C 6.9 09/18/2020 12:00 AM   MICROALBUR 0.681 03/29/2021 12:00 AM  MICROALBUR 2.58 02/27/2016 12:00 AM    Kidney Function Lab Results  Component Value Date/Time   CREATININE 1.01 09/12/2021 12:17 PM   CREATININE 1.08 09/05/2021 09:07 AM   CREATININE 1.08 08/26/2019 09:08 AM   CREATININE 1.2 10/30/2017 08:45 AM   CREATININE 1.0 04/29/2017 09:09 AM   GFR 71.44 09/12/2021 12:17 PM   GFRNONAA >60 05/29/2021 10:42 AM   GFRNONAA >60 08/26/2019 09:08 AM   GFRAA >60 08/26/2019 09:08 AM    Current antihyperglycemic regimen:  Lantus 50 units at bedtime -  Novolog 30 units with meals    What recent interventions/DTPs have been made to improve glycemic control:  No recent interventions or DTPs.  Have there been any recent hospitalizations or ED visits since last visit with CPP? No  Patient denies hypoglycemic symptoms.  Patient denies hyperglycemic symptoms.  How often are you checking your blood sugar? twice daily  What are your blood sugars ranging?  Fasting: 136  During the week, how often does your blood glucose drop below 70? Never  Are you checking your feet daily/regularly? Yes, patients wife checks his feet daily.  Adherence Review: Is the patient currently on a STATIN medication? Yes Is the patient currently on ACE/ARB medication? Yes Does the patient have >5 day gap between last estimated fill dates? No   Care Gaps: Medicare Annual Wellness: Scheduled Ophthalmology Exam: Overdue since 01/02/2021 Foot Exam: Next due on 05/04/2022 Hemoglobin A1C: 6.2% on 09/05/2021 Colonoscopy: Next due on 02/26/2022  Future Appointments  Date Time Provider [place]  10/23/2021  9:00 AM [facility] NURSE [facility]  11/05/2021  7:30 AM Hayden Pedro, MD [facility] None  11/13/2021 11:45 AM [facility] HEALTH COACH [facility]  12/05/2021  1:30 PM [facility] CCM PHARMACIST [facility]  12/21/2021  8:40 AM [name], MD [facility]  02/06/2022  9:00 AM [facility] LAB [facility] None  02/06/2022  9:30 AM Wyatt Portela, MD [facility] None  04/02/2022  3:00 PM Tat, Eustace Quail, DO [facility] None  09/16/2022  9:15 AM [name], MD [facility]    Star Rating Drugs: Novolog 100u/mL last filled 07/14/2021 90 DS Losartan 100 mg last filled 07/27/2021 90 DS Simvastatin 80 mg last filled 07/26/2021 90 DS  April D Calhoun, [place] Pharmacist Assistant [phone]

## 2021-10-23 ENCOUNTER — Ambulatory Visit (INDEPENDENT_AMBULATORY_CARE_PROVIDER_SITE_OTHER): Payer: Medicare Other

## 2021-10-23 ENCOUNTER — Other Ambulatory Visit: Payer: Self-pay

## 2021-10-23 DIAGNOSIS — E538 Deficiency of other specified B group vitamins: Secondary | ICD-10-CM | POA: Diagnosis not present

## 2021-10-23 MED ORDER — CYANOCOBALAMIN 1000 MCG/ML IJ SOLN: 1000.0000 ug | Freq: Once | INTRAMUSCULAR | Status: DC

## 2021-10-23 MED ORDER — CYANOCOBALAMIN 1000 MCG/ML IJ SOLN
1000.0000 ug | Freq: Once | INTRAMUSCULAR | Status: AC
  Administered 2021-10-23: 1000 ug via INTRAMUSCULAR

## 2021-10-23 NOTE — Progress Notes (Signed)
[name], 80 year old male presents in office today for b12 injection per [name], MD. Tolerated injection well.

## 2021-10-30 ENCOUNTER — Ambulatory Visit: Payer: Medicare Other

## 2021-11-05 ENCOUNTER — Encounter (INDEPENDENT_AMBULATORY_CARE_PROVIDER_SITE_OTHER): Payer: Medicare Other | Admitting: Ophthalmology

## 2021-11-05 ENCOUNTER — Other Ambulatory Visit: Payer: Self-pay

## 2021-11-05 DIAGNOSIS — H353231 Exudative age-related macular degeneration, bilateral, with active choroidal neovascularization: Secondary | ICD-10-CM

## 2021-11-05 DIAGNOSIS — I10 Essential (primary) hypertension: Secondary | ICD-10-CM

## 2021-11-05 DIAGNOSIS — H43813 Vitreous degeneration, bilateral: Secondary | ICD-10-CM | POA: Diagnosis not present

## 2021-11-05 DIAGNOSIS — H35033 Hypertensive retinopathy, bilateral: Secondary | ICD-10-CM | POA: Diagnosis not present

## 2021-11-06 ENCOUNTER — Ambulatory Visit: Payer: Medicare Other

## 2021-11-08 ENCOUNTER — Encounter (INDEPENDENT_AMBULATORY_CARE_PROVIDER_SITE_OTHER): Payer: Medicare Other | Admitting: Ophthalmology

## 2021-11-13 ENCOUNTER — Other Ambulatory Visit: Payer: Self-pay

## 2021-11-13 ENCOUNTER — Ambulatory Visit (INDEPENDENT_AMBULATORY_CARE_PROVIDER_SITE_OTHER): Payer: Medicare Other

## 2021-11-13 DIAGNOSIS — Z0000 Encounter for general adult medical examination without abnormal findings: Secondary | ICD-10-CM | POA: Diagnosis not present

## 2021-11-13 NOTE — Patient Instructions (Signed)
[name] , Thank you for taking time to come for your Medicare Wellness Visit. I appreciate your ongoing commitment to your health goals. Please review the following plan we discussed and let me know if I can assist you in the future.   Screening recommendations/referrals: Colonoscopy: Done 02/26/17 repeat every 5 years  Recommended yearly ophthalmology/optometry visit for glaucoma screening and checkup Recommended yearly dental visit for hygiene and checkup  Vaccinations: Influenza vaccine: Done 09/19/21 repeat every year Pneumococcal vaccine: Up to date Tdap vaccine: Done 08/31/14 repeat every 10 years  Shingles vaccine: Shingrix discussed. Please contact your pharmacy for coverage information.    Covid-19: Completed 1/11, 2/14, 07/26/20 & 4/18 & 08/25/21  Advanced directives: Copies in chart  Conditions/risks identified: stay Healthy  Next appointment: Follow up in one year for your annual wellness visit.   Preventive Care 45 Years and Older, Male Preventive care refers to lifestyle choices and visits with your health care provider that can promote health and wellness. What does preventive care include? A yearly physical exam. This is also called an annual well check. Dental exams once or twice a year. Routine eye exams. Ask your health care provider how often you should have your eyes checked. Personal lifestyle choices, including: Daily care of your teeth and gums. Regular physical activity. Eating a healthy diet. Avoiding tobacco and drug use. Limiting alcohol use. Practicing safe sex. Taking low doses of aspirin every day. Taking vitamin and mineral supplements as recommended by your health care provider. What happens during an annual well check? The services and screenings done by your health care provider during your annual well check will depend on your age, overall health, lifestyle risk factors, and family history of disease. Counseling  Your health care provider may  ask you questions about your: Alcohol use. Tobacco use. Drug use. Emotional well-being. Home and relationship well-being. Sexual activity. Eating habits. History of falls. Memory and ability to understand (cognition). Work and work Statistician. Screening  You may have the following tests or measurements: Height, weight, and BMI. Blood pressure. Lipid and cholesterol levels. These may be checked every 5 years, or more frequently if you are over 64 years old. Skin check. Lung cancer screening. You may have this screening every year starting at age 81 if you have a 30-pack-year history of smoking and currently smoke or have quit within the past 15 years. Fecal occult blood test (FOBT) of the stool. You may have this test every year starting at age 64. Flexible sigmoidoscopy or colonoscopy. You may have a sigmoidoscopy every 5 years or a colonoscopy every 10 years starting at age 25. Prostate cancer screening. Recommendations will vary depending on your family history and other risks. Hepatitis C blood test. Hepatitis B blood test. Sexually transmitted disease (STD) testing. Diabetes screening. This is done by checking your blood sugar (glucose) after you have not eaten for a while (fasting). You may have this done every 1-3 years. Abdominal aortic aneurysm (AAA) screening. You may need this if you are a current or former smoker. Osteoporosis. You may be screened starting at age 42 if you are at high risk. Talk with your health care provider about your test results, treatment options, and if necessary, the need for more tests. Vaccines  Your health care provider may recommend certain vaccines, such as: Influenza vaccine. This is recommended every year. Tetanus, diphtheria, and acellular pertussis (Tdap, Td) vaccine. You may need a Td booster every 10 years. Zoster vaccine. You may need this after  age 45. Pneumococcal 13-valent conjugate (PCV13) vaccine. One dose is recommended after age  69. Pneumococcal polysaccharide (PPSV23) vaccine. One dose is recommended after age 47. Talk to your health care provider about which screenings and vaccines you need and how often you need them. This information is not intended to replace advice given to you by your health care provider. Make sure you discuss any questions you have with your health care provider. Document Released: 12/15/2015 Document Revised: 08/07/2016 Document Reviewed: 09/19/2015 Elsevier Interactive Patient Education  2017 [place] Prevention in the Home Falls can cause injuries. They can happen to people of all ages. There are many things you can do to make your home safe and to help prevent falls. What can I do on the outside of my home? Regularly fix the edges of walkways and driveways and fix any cracks. Remove anything that might make you trip as you walk through a door, such as a raised step or threshold. Trim any bushes or trees on the path to your home. Use bright outdoor lighting. Clear any walking paths of anything that might make someone trip, such as rocks or tools. Regularly check to see if handrails are loose or broken. Make sure that both sides of any steps have handrails. Any raised decks and porches should have guardrails on the edges. Have any leaves, snow, or ice cleared regularly. Use sand or salt on walking paths during winter. Clean up any spills in your garage right away. This includes oil or grease spills. What can I do in the bathroom? Use night lights. Install grab bars by the toilet and in the tub and shower. Do not use towel bars as grab bars. Use non-skid mats or decals in the tub or shower. If you need to sit down in the shower, use a plastic, non-slip stool. Keep the floor dry. Clean up any water that spills on the floor as soon as it happens. Remove soap buildup in the tub or shower regularly. Attach bath mats securely with double-sided non-slip rug tape. Do not have throw  rugs and other things on the floor that can make you trip. What can I do in the bedroom? Use night lights. Make sure that you have a light by your bed that is easy to reach. Do not use any sheets or blankets that are too big for your bed. They should not hang down onto the floor. Have a firm chair that has side arms. You can use this for support while you get dressed. Do not have throw rugs and other things on the floor that can make you trip. What can I do in the kitchen? Clean up any spills right away. Avoid walking on wet floors. Keep items that you use a lot in easy-to-reach places. If you need to reach something above you, use a strong step stool that has a grab bar. Keep electrical cords out of the way. Do not use floor polish or wax that makes floors slippery. If you must use wax, use non-skid floor wax. Do not have throw rugs and other things on the floor that can make you trip. What can I do with my stairs? Do not leave any items on the stairs. Make sure that there are handrails on both sides of the stairs and use them. Fix handrails that are broken or loose. Make sure that handrails are as long as the stairways. Check any carpeting to make sure that it is firmly attached to the stairs.  Fix any carpet that is loose or worn. Avoid having throw rugs at the top or bottom of the stairs. If you do have throw rugs, attach them to the floor with carpet tape. Make sure that you have a light switch at the top of the stairs and the bottom of the stairs. If you do not have them, ask someone to add them for you. What else can I do to help prevent falls? Wear shoes that: Do not have high heels. Have rubber bottoms. Are comfortable and fit you well. Are closed at the toe. Do not wear sandals. If you use a stepladder: Make sure that it is fully opened. Do not climb a closed stepladder. Make sure that both sides of the stepladder are locked into place. Ask someone to hold it for you, if  possible. Clearly mark and make sure that you can see: Any grab bars or handrails. First and last steps. Where the edge of each step is. Use tools that help you move around (mobility aids) if they are needed. These include: Canes. Walkers. Scooters. Crutches. Turn on the lights when you go into a dark area. Replace any light bulbs as soon as they burn out. Set up your furniture so you have a clear path. Avoid moving your furniture around. If any of your floors are uneven, fix them. If there are any pets around you, be aware of where they are. Review your medicines with your doctor. Some medicines can make you feel dizzy. This can increase your chance of falling. Ask your doctor what other things that you can do to help prevent falls. This information is not intended to replace advice given to you by your health care provider. Make sure you discuss any questions you have with your health care provider. Document Released: 09/14/2009 Document Revised: 04/25/2016 Document Reviewed: 12/23/2014 Elsevier Interactive Patient Education  2017 [employer].

## 2021-11-13 NOTE — Progress Notes (Signed)
Virtual Visit via Telephone Note  I connected with  [name] on 11/13/21 at 11:45 AM EST by telephone and verified that I am speaking with the correct person using two identifiers.  Medicare Annual Wellness visit completed telephonically due to Covid-19 pandemic.   Persons participating in this call: This Health Coach and this patient.   Location: Patient: home Provider: office   I discussed the limitations, risks, security and privacy concerns of performing an evaluation and management service by telephone and the availability of in person appointments. The patient expressed understanding and agreed to proceed.  Unable to perform video visit due to video visit attempted and failed and/or patient does not have video capability.   Some vital signs may be absent or patient reported.   Willette Brace, LPN   Subjective:   [name] is a 80 y.o. male who presents for Medicare Annual/Subsequent preventive examination.  Review of Systems     Cardiac Risk Factors include: advanced age (>53men, >65 women);hypertension;dyslipidemia;male gender;diabetes mellitus;obesity (BMI >30kg/m2)     Objective:    There were no vitals filed for this visit. There is no height or weight on file to calculate BMI.  Advanced Directives 11/13/2021 09/24/2021 05/31/2021 05/23/2021 12/23/2020 04/28/2018 12/18/2017  Does Patient Have a Medical Advance Directive? Yes Yes Yes Yes Yes Yes Yes  Type of Network engineer will [place];Living will [place];Living will - -  Does patient want to make changes to medical advance directive? - - No - Patient declined No - Patient declined - - -  Copy of [place] in Chart? Yes - validated most recent copy scanned in chart (See row information) - Yes - validated most recent copy scanned in chart (See row information) - - - -  Would patient  like information on creating a medical advance directive? - - - - - - -  Pre-existing out of facility DNR order (yellow form or pink MOST form) - - - - - - -    Current Medications (verified) Outpatient Encounter Medications as of 11/13/2021  Medication Sig   amLODipine (NORVASC) 10 MG tablet Take 10 mg by mouth daily.   azelastine (ASTELIN) 0.1 % nasal spray Place 1 spray into both nostrils 2 (two) times daily. Use in each nostril as directed   carbidopa-levodopa (SINEMET IR) 25-100 MG tablet Take 1 tablet by mouth 3 (three) times daily. 6am/10am/2pm   carbidopa-levodopa (SINEMET IR) 25-100 MG tablet Take 1 tablet by mouth 3 (three) times daily.   Cholecalciferol 50 MCG (2000 UT) TABS Take by mouth.   fluticasone (FLONASE) 50 MCG/ACT nasal spray Place into both nostrils.   glucose 4 GM chewable tablet Chew by mouth.   glucose blood test strip Use to test 4 times daily. E11.9   hydrochlorothiazide (HYDRODIURIL) 25 MG tablet Take 25 mg by mouth daily.   insulin aspart (NOVOLOG) 100 UNIT/ML injection Inject 30 Units into the skin 3 (three) times daily with meals. When sugar is elevated   insulin glargine (LANTUS) 100 UNIT/ML injection Inject 50 Units into the skin at bedtime.   lansoprazole (PREVACID) 30 MG capsule Take 1 capsule (30 mg total) by mouth daily at 12 noon.   levothyroxine (SYNTHROID, LEVOTHROID) 50 MCG tablet Take 50 mcg by mouth daily before breakfast.   losartan (COZAAR) 100 MG tablet Take 100 mg by mouth daily.   mirtazapine (REMERON) 15 MG tablet Take  15 mg by mouth at bedtime.   ondansetron (ZOFRAN ODT) 4 MG disintegrating tablet Take 1 tablet (4 mg total) by mouth every 8 (eight) hours as needed for nausea or vomiting.   OVER THE COUNTER MEDICATION PreserVision for eyes   potassium chloride SA (K-DUR,KLOR-CON) 20 MEQ tablet Take 20 mEq by mouth daily.   simvastatin (ZOCOR) 80 MG tablet Take half tablet (40 mg) by mouth once daily   traMADol (ULTRAM) 50 MG tablet Take 1  tablet (50 mg total) by mouth every 6 (six) hours as needed.   sucralfate (CARAFATE) 1 G tablet Take 1 g by mouth 4 (four) times daily -  with meals and at bedtime.   No facility-administered encounter medications on file as of 11/13/2021.    Allergies (verified) Morphine sulfate   History: Past Medical History:  Diagnosis Date   Arthritis    Atypical mole 04/02/1999   (MODERATE) LEFT BACK WAIST LINE DR Tonia Brooms   Basal cell carcinoma 03/18/2017   LEFT NECK CX3 5FU   Cataract    Colon polyps    DDD (degenerative disc disease), lumbar    Depression    PTSD   Diabetes mellitus    Diabetic retinopathy    Diverticulosis of colon    Elevated PSA    ERECTILE DYSFUNCTION 12/30/2007   No rx.      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   ([place]) RIGHT BUTTOCKS WIDER SHAVE DR GRUBER   OSA (obstructive sleep apnea)    PONV (postoperative nausea and vomiting)    Prostate cancer ([place])    PTSD (post-traumatic stress disorder)    [facility]   Sleep apnea    Tremors of nervous system    Past Surgical History:  Procedure Laterality Date   CHOLECYSTECTOMY     COLONOSCOPY  03/25/12, 09/28/08   ESOPHAGOGASTRODUODENOSCOPY N/A 10/12/2014   Procedure: ESOPHAGOGASTRODUODENOSCOPY (EGD);  Surgeon: Gatha Mayer, MD;  Location: [facility];  Service: Endoscopy;  Laterality: N/A;   ESOPHAGOGASTRODUODENOSCOPY ENDOSCOPY  09/28/08   EXCISION MELANOMA WITH SENTINEL LYMPH NODE BIOPSY Right 05/31/2021   Procedure: WIDE LOCAL EXCISION RIGHT FOREARM WITH ADVANCEDMENT FLAP CLOSURE WITH SENTINEL LYMPH NODE MAPPING AND BIOSPY;  Surgeon: [name], MD;  Location: [facility];  Service: General;  Laterality: Right;   FOOT SURGERY Bilateral     HEMORRHOID SURGERY     LAPAROSCOPIC GASTRIC BANDING  03/05/11   weight loss   LAPAROSCOPIC GASTRIC BANDING WITH HIATAL HERNIA REPAIR  03/05/2011   LYMPHADENECTOMY Bilateral 01/24/2014   Procedure: LYMPHADENECTOMY;  Surgeon: Dutch Gray, MD;  Location: [facility];  Service: Urology;  Laterality: Bilateral;   ORIF TIBIA FRACTURE Right    PENILE PROSTHESIS IMPLANT     PROSTATE SURGERY  01/2014   ROBOT ASSISTED LAPAROSCOPIC RADICAL PROSTATECTOMY N/A 01/24/2014   Procedure: ROBOTIC ASSISTED LAPAROSCOPIC RADICAL PROSTATECTOMY LEVEL 3;  Surgeon: Dutch Gray, MD;  Location: [facility];  Service: Urology;  Laterality: N/A;   SHOULDER SURGERY Right 2011   TONSILLECTOMY     age 16   VASECTOMY     Family History  Problem Relation Age of Onset   Hypertension Mother    Stomach cancer Mother 48   Alcohol abuse  Brother    Hypertension Paternal Uncle    Heart attack Paternal Uncle    Diabetes Child    Hypertension Child    Colon cancer Neg Hx    Colon polyps Neg Hx    Rectal cancer Neg Hx    Social History   Socioeconomic History   Marital status: Married    Spouse name: Not on file   Number of children: 5   Years of education: Not on file   Highest education level: Not on file  Occupational History   Occupation: retired    Fish farm manager: RETIRED    Comment: army x 2 years; Charity fundraiser; Clinical biochemist  Tobacco Use   Smoking status: Former    Packs/day: 1.00    Years: 20.00    Pack years: 20.00    Types: Cigarettes    Quit date: 02/25/1981    Years since quitting: 40.7   Smokeless tobacco: Never  Vaping Use   Vaping Use: Never used  Substance and Sexual Activity   Alcohol use: No    Alcohol/week: 0.0 standard drinks    Comment: stopped drinking 76 or 77    Drug use: No   Sexual activity: Yes    Comment: Been able to utilize his penile prosthesis successfully  Other Topics Concern   Not on file  Social History Narrative   Married. 3 children from previous marriage, 2 step children. 15 grandkids.        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, watch tv ([employer])   Social Determinants of Health   Financial Resource Strain: Low Risk    Difficulty of Paying Living Expenses: Not hard at all  Food Insecurity: No Food Insecurity   Worried About Charity fundraiser in the Last Year: Never true   Arboriculturist in the Last Year: Never true  Transportation Needs: No Transportation Needs   Lack of Transportation (Medical): No   Lack of Transportation (Non-Medical): No  Physical Activity: Sufficiently Active   Days of Exercise per Week: 5 days   Minutes of Exercise per Session: 30 min  Stress: No Stress Concern Present   Feeling of Stress : Not at all  Social Connections: Moderately Integrated   Frequency of Communication with Friends and Family: Never   Frequency of Social Gatherings with Friends and Family: More than three times a week   Attends Religious Services: More than 4 times per year   Active Member of [employer] or Organizations: No   Attends Music therapist: Never   Marital Status: Married    Tobacco Counseling Counseling given: Not Answered   Clinical Intake:  Pre-visit preparation completed: Yes  Pain : No/denies pain     Nutritional Status: BMI > 30  Obese Nutritional Risks: None Diabetes: Yes CBG done?: Yes (128) CBG resulted in Enter/ Edit results?: No Did pt. bring in CBG monitor from home?: No  How often do you need to have someone help you when you read instructions, pamphlets, or other written materials from your doctor or pharmacy?: 1 - Never  Diabetic?Nutrition Risk Assessment:  Has the patient had any N/V/D within the last 2 months?  No  Does the patient have any non-healing wounds?  No  Has the patient had any unintentional weight loss or weight gain?  No   Diabetes:  Is the patient diabetic?  Yes  If diabetic, was a CBG obtained today?  Yes  Did the  patient bring in their  glucometer from home?  No  How often do you monitor your CBG's? Daily .   Financial Strains and Diabetes Management:  Are you having any financial strains with the device, your supplies or your medication? No .  Does the patient want to be seen by Chronic Care Management for management of their diabetes?  No  Would the patient like to be referred to a Nutritionist or for Diabetic Management?  No   Diabetic Exams:  Diabetic Eye Exam: Overdue for diabetic eye exam. Pt has been advised about the importance in completing this exam. Patient advised to call and schedule an eye exam. Diabetic Foot Exam: Completed 05/04/21   Interpreter Needed?: No  Information entered by :: Charlott Rakes, LPN   Activities of Daily Living In your present state of health, do you have any difficulty performing the following activities: 11/13/2021 05/31/2021  Hearing? Y N  Vision? N N  Difficulty concentrating or making decisions? N N  Walking or climbing stairs? N N  Dressing or bathing? N N  Doing errands, shopping? N -  Preparing Food and eating ? N -  Using the Toilet? N -  In the past six months, have you accidently leaked urine? N -  Do you have problems with loss of bowel control? N -  Managing your Medications? N -  Managing your Finances? N -  Housekeeping or managing your Housekeeping? N -  Some recent data might be hidden    Patient Care Team: [name], MD as PCP - General (Family Medicine) Hayden Pedro, MD as Consulting Physician (Ophthalmology) Madelin Rear, [facility] as Pharmacist (Pharmacist) [name], MD as Consulting Physician (Dermatology)  Indicate any recent Medical Services you may have received from other than [facility] providers in the past year (date may be approximate).     Assessment:   This is a routine wellness examination for Jemal.  Hearing/Vision screen Hearing Screening - Comments:: Pt wears hearing aids  Vision Screening - Comments:: Pt follows up with  Dr Zigmund Daniel for annual eye exams   Dietary issues and exercise activities discussed: Current Exercise Habits: Home exercise routine, Type of exercise: walking, Time (Minutes): 30, Frequency (Times/Week): 5, Weekly Exercise (Minutes/Week): 150   Goals Addressed             This Visit's Progress    Patient Stated       Stay healthy       Depression Screen PHQ 2/9 Scores 11/13/2021 09/05/2021 05/04/2021 12/28/2020 06/07/2020 05/08/2020 11/05/2019  PHQ - 2 Score 0 0 0 0 0 0 0  PHQ- 9 Score - - - - 0 0 3    Fall Risk Fall Risk  11/13/2021 09/24/2021 09/05/2021 07/25/2020 05/02/2020  Falls in the past year? 0 0 0 0 0  Comment - - - - -  Number falls in past yr: 0 0 0 0 0  Injury with Fall? 0 0 0 0 0  Risk for fall due to : - - No Fall Risks - -  Follow up Falls prevention discussed - Falls evaluation completed - -    FALL RISK PREVENTION PERTAINING TO THE HOME:  Any stairs in or around the home? Yes  If so, are there any without handrails? No  Home free of loose throw rugs in walkways, pet beds, electrical cords, etc? Yes  Adequate lighting in your home to reduce risk of falls? Yes   ASSISTIVE DEVICES UTILIZED TO PREVENT FALLS:  Life  alert? Yes  Use of a cane, walker or w/c? No  Grab bars in the bathroom? Yes  Shower chair or bench in shower? Yes  Elevated toilet seat or a handicapped toilet? No   TIMED UP AND GO:  Was the test performed? No .   Cognitive Function:     6CIT Screen 11/13/2021 12/18/2017  What Year? 0 points 0 points  What month? 0 points 0 points  What time? 0 points 0 points  Count back from 20 0 points 0 points  Months in reverse 0 points 0 points  Repeat phrase 0 points 0 points  Total Score 0 0    Immunizations Immunization History  Administered Date(s) Administered   Fluad Quad(high Dose 65+) 07/29/2019, 08/08/2020   Hep A / Hep B 07/29/2011, 01/27/2012   Hepatitis B 08/26/2011   Influenza Split 09/18/2012   Influenza Whole 09/22/2007,  09/01/2009, 08/31/2010   Influenza, High Dose Seasonal PF 08/13/2017, 07/30/2018   Influenza,inj,Quad PF,6+ Mos 08/11/2013, 08/31/2014, 08/11/2015, 09/19/2021   Influenza-Unspecified 08/20/2016   PFIZER(Purple Top)SARS-COV-2 Vaccination 12/13/2019, 01/16/2020, 07/26/2020, 03/19/2021, 08/25/2021   PNEUMOCOCCAL CONJUGATE-20 05/04/2021   Pneumococcal Conjugate-13 09/06/2014   Pneumococcal Polysaccharide-23 12/02/2008   Td 05/07/2007   Tdap 08/31/2014   Zoster, Live 12/03/2007    TDAP status: Up to date  Flu Vaccine status: Up to date  Pneumococcal vaccine status: Up to date  Covid-19 vaccine status: Completed vaccines  Qualifies for Shingles Vaccine? Yes   Zostavax completed No   Shingrix Completed?: No.    Education has been provided regarding the importance of this vaccine. Patient has been advised to call insurance company to determine out of pocket expense if they have not yet received this vaccine. Advised may also receive vaccine at local pharmacy or Health Dept. Verbalized acceptance and understanding.  Screening Tests Health Maintenance  Topic Date Due   OPHTHALMOLOGY EXAM  01/02/2021   COVID-19 Vaccine (6 - Booster for Pfizer series) 10/20/2021   Zoster Vaccines- Shingrix (1 of 2) 12/13/2021 (Originally 08/11/1960)   COLONOSCOPY (Pts 45-79yrs Insurance coverage will need to be confirmed)  02/26/2022   HEMOGLOBIN A1C  03/06/2022   FOOT EXAM  05/04/2022   TETANUS/TDAP  08/31/2024   Pneumonia Vaccine 66+ Years old  Completed   INFLUENZA VACCINE  Completed   Hepatitis C Screening  Completed   HPV VACCINES  Aged Out    Health Maintenance  Health Maintenance Due  Topic Date Due   OPHTHALMOLOGY EXAM  01/02/2021   COVID-19 Vaccine (6 - Booster for [place] series) 10/20/2021    Colorectal cancer screening: Type of screening: Colonoscopy. Completed 02/26/17. Repeat every 5 years   Additional Screening:  Hepatitis C Screening:  Completed 06/07/20  Vision Screening:  Recommended annual ophthalmology exams for early detection of glaucoma and other disorders of the eye. Is the patient up to date with their annual eye exam?  Yes  Who is the provider or what is the name of the office in which the patient attends annual eye exams? Dr Zigmund Daniel  If pt is not established with a provider, would they like to be referred to a provider to establish care? No .   Dental Screening: Recommended annual dental exams for proper oral hygiene  Community Resource Referral / Chronic Care Management: CRR required this visit?  No   CCM required this visit?  No      Plan:     I have personally reviewed and noted the following in the patients chart:   Medical  and social history Use of alcohol, tobacco or illicit drugs  Current medications and supplements including opioid prescriptions. Patient is currently taking opioid prescriptions. Information provided to patient regarding non-opioid alternatives. Patient advised to discuss non-opioid treatment plan with their provider. Functional ability and status Nutritional status Physical activity Advanced directives List of other physicians Hospitalizations, surgeries, and ER visits in previous 12 months Vitals Screenings to include cognitive, depression, and falls Referrals and appointments  In addition, I have reviewed and discussed with patient certain preventive protocols, quality metrics, and best practice recommendations. A written personalized care plan for preventive services as well as general preventive health recommendations were provided to patient.     Willette Brace, LPN   [id number]   Nurse Notes: None

## 2021-11-19 ENCOUNTER — Ambulatory Visit: Payer: Medicare Other | Admitting: Neurology

## 2021-11-20 ENCOUNTER — Ambulatory Visit (INDEPENDENT_AMBULATORY_CARE_PROVIDER_SITE_OTHER): Payer: Medicare Other

## 2021-11-20 VITALS — Temp 98.1°F | Wt 235.0 lb

## 2021-11-20 DIAGNOSIS — E538 Deficiency of other specified B group vitamins: Secondary | ICD-10-CM

## 2021-11-20 MED ORDER — CYANOCOBALAMIN 1000 MCG/ML IJ SOLN
1000.0000 ug | Freq: Once | INTRAMUSCULAR | Status: AC
Start: 2021-11-20 — End: 2021-11-20
  Administered 2021-11-20: 09:00:00 1000 ug via INTRAMUSCULAR

## 2021-11-20 NOTE — Progress Notes (Signed)
After obtaining consent, and per orders of Dr. Yong Channel, injection of B12 given left deltoid by Lynda Rainwater. Patient instructed to remain in clinic for 20 minutes afterwards, and to report any adverse reaction to me immediately.

## 2021-11-29 ENCOUNTER — Encounter: Payer: Self-pay | Admitting: Family Medicine

## 2021-12-04 ENCOUNTER — Encounter (INDEPENDENT_AMBULATORY_CARE_PROVIDER_SITE_OTHER): Payer: Medicare Other | Admitting: Ophthalmology

## 2021-12-04 ENCOUNTER — Other Ambulatory Visit: Payer: Self-pay

## 2021-12-04 DIAGNOSIS — H353231 Exudative age-related macular degeneration, bilateral, with active choroidal neovascularization: Secondary | ICD-10-CM

## 2021-12-04 DIAGNOSIS — H35033 Hypertensive retinopathy, bilateral: Secondary | ICD-10-CM | POA: Diagnosis not present

## 2021-12-04 DIAGNOSIS — I10 Essential (primary) hypertension: Secondary | ICD-10-CM | POA: Diagnosis not present

## 2021-12-04 DIAGNOSIS — H43813 Vitreous degeneration, bilateral: Secondary | ICD-10-CM

## 2021-12-05 ENCOUNTER — Telehealth: Payer: Medicare Other

## 2021-12-05 DIAGNOSIS — Z8546 Personal history of malignant neoplasm of prostate: Secondary | ICD-10-CM | POA: Diagnosis not present

## 2021-12-05 NOTE — Progress Notes (Signed)
Chronic Care Management Pharmacy Note  12/10/2021 Name:  [name] MRN:  [id number] DOB:  [birth year]  Summary: PharmD follow up.  Patient has not resumed Ozempic due to perceived side effects.  Fasting sugar 159 this am and this is what it has been recently.  Sometimes > 200 after a meal.   Recommendations/Changes made from today's visit: Recheck A1c at upcoming OV - consider restart Ozempic if elevated.  Plan: Fu PharmD 6 months DM check in 3 mo   Subjective: HOUSTON ZAPIEN is an 81 y.o. year old male who is a primary patient of Hunter, [name], MD.  The CCM team was consulted for assistance with disease management and care coordination needs.    Engaged with patient by telephone for follow up visit in response to provider referral for pharmacy case management and/or care coordination services.   Consent to Services:  The patient was given the following information about Chronic Care Management services today, agreed to services, and gave verbal consent: 1. CCM service includes personalized support from designated clinical staff supervised by the primary care provider, including individualized plan of care and coordination with other care providers 2. 24/7 contact phone numbers for assistance for urgent and routine care needs. 3. Service will only be billed when office clinical staff spend 20 minutes or more in a month to coordinate care. 4. Only one practitioner may furnish and bill the service in a calendar month. 5.The patient may stop CCM services at any time (effective at the end of the month) by phone call to the office staff. 6. The patient will be responsible for cost sharing (co-pay) of up to 20% of the service fee (after annual deductible is met). Patient agreed to services and consent obtained.  Patient Care Team: [name], MD as PCP - General (Family Medicine) Hayden Pedro, MD as Consulting Physician (Ophthalmology) [name], MD as Consulting  Physician (Dermatology) Edythe Clarity, [facility] (Pharmacist)  Recent office visits:  09/05/2021 OV (PCP) [name], MD;  with lower appetite recently after covid we opted to hold ozempic for 3 weeks then can restart at 0.5. update a1c- likely continue current meds   Recent consult visits:  09/24/2021 OV (neurology) Tat, Eustace Quail, DO; Take 1/2 tablet three times daily, at least 30 minutes before meals (approximately 6am/10am/2pm), for one week -Then take 1/2 tablet in the morning, 1/2 tablet in the afternoon, 1 tablet in the evening, at least 30 minutes before meals, for one week -Then take 1/2 tablet in the morning, 1 tablet in the afternoon, 1 tablet in the evening, at least 30 minutes before meals, for one week -Then take 1 tablet three times daily at 6am/10am/2pm, at least 30 minutes before meals   09/11/2021 OV (dermatology) [name], MD; no medication changes indicated.   08/08/2021 OV (oncology) Wyatt Portela, MD; no medication changes indicated.   Hospital visits:  None in previous 6 months  Objective:  Lab Results  Component Value Date   CREATININE 1.01 09/12/2021   BUN 20 09/12/2021   GFR 71.44 09/12/2021   GFRNONAA >60 05/29/2021   GFRAA >60 08/26/2019   NA 141 09/12/2021   K 4.0 09/12/2021   CALCIUM 9.6 09/12/2021   CO2 28 09/12/2021   GLUCOSE 136 (H) 09/12/2021    Lab Results  Component Value Date/Time   HGBA1C 6.2 09/05/2021 09:07 AM   HGBA1C 6.5 03/29/2021 12:00 AM   HGBA1C 6.9 09/18/2020 12:00 AM   GFR  71.44 09/12/2021 12:17 PM   GFR 65.93 09/05/2021 09:07 AM   MICROALBUR 0.681 03/29/2021 12:00 AM   MICROALBUR 2.58 02/27/2016 12:00 AM    Last diabetic Eye exam:  Lab Results  Component Value Date/Time   HMDIABEYEEXA No Retinopathy 01/03/2020 12:00 AM    Last diabetic Foot exam:  Lab Results  Component Value Date/Time   HMDIABFOOTEX done 11/16/2013 12:00 AM     Lab Results  Component Value Date   CHOL 105 03/29/2021   HDL 42  03/29/2021   LDLCALC 11 03/29/2021   LDLDIRECT 47.0 11/05/2019   TRIG 258 (A) 03/29/2021   CHOLHDL 2 07/16/2017    Hepatic Function Latest Ref Rng & Units 09/12/2021 09/05/2021 05/29/2021  Total Protein 6.0 - 8.3 g/dL 7.1 6.4 6.6  Albumin 3.5 - 5.2 g/dL 4.6 4.1 4.1  AST 0 - 37 U/L _0 ALT 0 - 53 U/L 24 25 34  Alk Phosphatase 39 - 117 U/L 56 49 47  Total Bilirubin 0.2 - 1.2 mg/dL 1.6(H) 1.3(H) 1.9(H)  Bilirubin, Direct 0.01 - 0.4 - - -    Lab Results  Component Value Date/Time   TSH 1.11 09/12/2021 12:17 PM   TSH 1.38 07/26/2021 12:00 AM   TSH 1.77 11/27/2020 03:32 PM   FREET4 0.84 05/23/2010 08:23 AM    CBC Latest Ref Rng & Units 09/12/2021 09/05/2021 08/08/2021  WBC 4.0 - 10.5 K/uL 13.2(H) 9.5 23.1(H)  Hemoglobin 13.0 - 17.0 g/dL 15.1 14.8 15.2  Hematocrit 39.0 - 52.0 % 45.9 43.6 45.6  Platelets 150.0 - 400.0 K/uL 236.0 155.0 212    Lab Results  Component Value Date/Time   VD25OH 26.80 09/18/2020 12:00 AM   VD25OH 19.85 06/01/2019 12:00 AM    Clinical ASCVD: No  The ASCVD Risk score (Arnett DK, et al., 2019) failed to calculate for the following reasons:   The valid total cholesterol range is 130 to 320 mg/dL    Depression screen [facility] 2/9 11/13/2021 09/05/2021 05/04/2021  Decreased Interest 0 0 0  Down, Depressed, Hopeless 0 0 0  PHQ - 2 Score 0 0 0  Altered sleeping - - -  Tired, decreased energy - - -  Change in appetite - - -  Feeling bad or failure about yourself  - - -  Trouble concentrating - - -  Moving slowly or fidgety/restless - - -  Suicidal thoughts - - -  PHQ-9 Score - - -  Difficult doing work/chores - - -  Some recent data might be hidden      Social History   Tobacco Use  Smoking Status Former   Packs/day: 1.00   Years: 20.00   Pack years: 20.00   Types: Cigarettes   Quit date: 02/25/1981   Years since quitting: 40.8  Smokeless Tobacco Never   BP Readings from Last 3 Encounters:  09/24/21 126/64  09/12/21 122/76  09/05/21 122/75    Pulse Readings from Last 3 Encounters:  09/24/21 96  09/12/21 92  09/05/21 94   Wt Readings from Last 3 Encounters:  11/20/21 235 lb (106.6 kg)  09/24/21 232 lb 12.8 oz (105.6 kg)  09/12/21 229 lb 9.6 oz (104.1 kg)   BMI Readings from Last 3 Encounters:  11/20/21 31.87 kg/m  09/24/21 31.57 kg/m  09/12/21 30.29 kg/m    Assessment/Interventions: Review of patient past medical history, allergies, medications, health status, including review of consultants reports, laboratory and other test data, was performed as part of comprehensive evaluation and provision of  chronic care management services.   SDOH:  (Social Determinants of Health) assessments and interventions performed: Yes  Financial Resource Strain: Low Risk    Difficulty of Paying Living Expenses: Not hard at all    SDOH Screenings   Alcohol Screen: Not on file  Depression (PHQ2-9): Low Risk    PHQ-2 Score: 0  Financial Resource Strain: Low Risk    Difficulty of Paying Living Expenses: Not hard at all  Food Insecurity: No Food Insecurity   Worried About Charity fundraiser in the Last Year: Never true   Ran Out of Food in the Last Year: Never true  Housing: Low Risk    Last Housing Risk Score: 0  Physical Activity: Sufficiently Active   Days of Exercise per Week: 5 days   Minutes of Exercise per Session: 30 min  Social Connections: Moderately Integrated   Frequency of Communication with Friends and Family: Never   Frequency of Social Gatherings with Friends and Family: More than three times a week   Attends Religious Services: More than 4 times per year   Active Member of [employer] or Organizations: No   Attends Music therapist: Never   Marital Status: Married  Stress: No Stress Concern Present   Feeling of Stress : Not at all  Tobacco Use: Medium Risk   Smoking Tobacco Use: Former   Smokeless Tobacco Use: Never   Passive Exposure: Not on [employer] consultant Needs: No Transportation Needs    Lack of Transportation (Medical): No   Lack of Transportation (Non-Medical): No    CCM Care Plan  Allergies  Allergen Reactions   Morphine Sulfate Itching and Rash    Medications Reviewed Today     Reviewed by Edythe Clarity, [facility] (Pharmacist) on 12/10/21 at 0925  Med List Status: <None>   Medication Order Taking? Sig Documenting Provider Last Dose Status Informant  amLODipine (NORVASC) 10 MG tablet [id number] Yes Take 10 mg by mouth daily. [provider] Taking Active   azelastine (ASTELIN) 0.1 % nasal spray [id number] Yes Place 1 spray into both nostrils 2 (two) times daily. Use in each nostril as directed [name], MD Taking Active   carbidopa-levodopa (SINEMET IR) 25-100 MG tablet [id number] Yes Take 1 tablet by mouth 3 (three) times daily. 6am/10am/2pm Tat, Eustace Quail, DO Taking Active   carbidopa-levodopa (SINEMET IR) 25-100 MG tablet [id number] Yes Take 1 tablet by mouth 3 (three) times daily. Ludwig Clarks, DO Taking Active   Cholecalciferol 50 MCG (2000 UT) TABS [id number] Yes Take by mouth. [provider] Taking Active   fluticasone (FLONASE) 50 MCG/ACT nasal spray [id number] Yes Place into both nostrils. [provider] Taking Active   glucose 4 GM chewable tablet [id number] Yes Chew by mouth. [provider] Taking Active   glucose blood test strip [id number] Yes Use to test 4 times daily. E11.9 [name], MD Taking Active   hydrochlorothiazide (HYDRODIURIL) 25 MG tablet [id number] Yes Take 25 mg by mouth daily. [provider] Taking Active   insulin aspart (NOVOLOG) 100 UNIT/ML injection [id number] Yes Inject 30 Units into the skin 3 (three) times daily with meals. When sugar is elevated [provider] Taking Active Self           Med Note (Aviah Sorci, SOPHIA A   Fri Dec 27, 2016  9:53 AM)    insulin glargine (LANTUS) 100 UNIT/ML injection [id number] Yes Inject 50 Units into the skin at bedtime. [provider] Taking  Active Self  lansoprazole (PREVACID) 30 MG capsule [id number] Yes Take 1 capsule (30 mg total) by mouth daily at 12 noon. [name], MD Taking Active   levothyroxine (SYNTHROID, LEVOTHROID) 50 MCG tablet [id number] Yes Take 50 mcg by mouth daily before breakfast. [provider] Taking Active Self  losartan (COZAAR) 100 MG tablet [id number] Yes Take 100 mg by mouth daily. [provider] Taking Active   mirtazapine (REMERON) 15 MG tablet [id number] Yes Take 15 mg by mouth at bedtime. [provider] Taking Active Self  ondansetron (ZOFRAN ODT) 4 MG disintegrating tablet [id number] Yes Take 1 tablet (4 mg total) by mouth every 8 (eight) hours as needed for nausea or vomiting. [name], MD Taking Active   OVER THE COUNTER MEDICATION [id number] Yes PreserVision for eyes [provider] Taking Active   potassium chloride SA (K-DUR,KLOR-CON) 20 MEQ tablet [id number] Yes Take 20 mEq by mouth daily. [provider] Taking Active Self  simvastatin (ZOCOR) 80 MG tablet [id number] Yes Take half tablet (40 mg) by mouth once daily [provider] Taking Active   sucralfate (CARAFATE) 1 G tablet [id number] Yes Take 1 g by mouth 4 (four) times daily -  with meals and at bedtime. [provider] Taking Active   traMADol (ULTRAM) 50 MG tablet [id number] Yes Take 1 tablet (50 mg total) by mouth every 6 (six) hours as needed. 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/2016   Chronic  bilateral low back pain without sciatica 12/12/2015   Tremor 08/11/2015   GERD (gastroesophageal reflux disease) 12/08/2014   Hypothyroidism    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   History of prostate cancer 01/24/2014   Lapband APL + HH repair 08/12/2013   Insomnia 09/21/2009   History of colonic polyps 08/22/2008   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[place]) 80/32/1224   Diabetic retinopathy ([place]) 09/24/2007   Hyperlipidemia associated with type 2 diabetes mellitus ([place]) 09/24/2007   OSA on CPAP 09/24/2007   Hypertension associated with diabetes ([place]) 09/24/2007    Immunization History  Administered Date(s) Administered   Fluad Quad(high Dose 65+) 07/29/2019, 08/08/2020   Hep A / Hep B 07/29/2011, 01/27/2012   Hepatitis B 08/26/2011   Influenza Split 09/18/2012   Influenza Whole 09/22/2007, 09/01/2009, 08/31/2010   Influenza, High Dose Seasonal PF 08/13/2017, 07/30/2018   Influenza,inj,Quad PF,6+ Mos 08/11/2013, 08/31/2014, 08/11/2015, 09/19/2021   Influenza-Unspecified 08/20/2016   PFIZER(Purple Top)SARS-COV-2 Vaccination 12/13/2019, 01/16/2020, 07/26/2020, 03/19/2021, 08/25/2021   PNEUMOCOCCAL CONJUGATE-20 05/04/2021   Pneumococcal Conjugate-13 09/06/2014   Pneumococcal Polysaccharide-23 12/02/2008   Td 05/07/2007   Tdap 08/31/2014   Zoster, Live 12/03/2007    Conditions to be addressed/monitored:  HTN, GERD, Type II DM, HLD, Hypothyroidism, Insomnia  Care Plan : General Pharmacy (Adult)  Updates made by Edythe Clarity, RPH since 12/10/2021 12:00 AM     Problem: HTN, GERD, Type II DM, HLD, Hypothyroidism, Insomnia   Priority: High  Onset Date: 12/10/2021     Long-Range Goal: Patient-Specific Goal  Start Date: 12/10/2021  Expected End Date: 06/09/2022  This Visit's Progress: On track  Priority: High  Note:   Current Barriers:  Unable to achieve control of glucose   Pharmacist Clinical Goal(s):  Patient will achieve improvement in  fasting glucose as evidenced by home monitoring through collaboration with PharmD and provider.   Interventions: 1:1 collaboration with [name], MD regarding development and update of comprehensive plan of care as evidenced by provider attestation and co-signature Inter-disciplinary care team collaboration (see longitudinal plan of care) Comprehensive medication review performed; medication list updated in electronic medical record  Hypertension (BP goal <130/80) -Controlled, based on recent office BP -Current treatment: Amlodipine 89m daily Appropriate, Effective, Safe, Accessible HCTZ 290mdaily Appropriate, Effective, Safe, Accessible Losartan 10043maily Appropriate, Effective, Safe, Accessible -Medications previously tried: propranolol, chlorthalidone  -Current home readings: not checking -Denies hypotensive/hypertensive symptoms -Educated on Importance of home blood pressure monitoring; Symptoms of hypotension and importance of maintaining adequate hydration; -Counseled to monitor BP at home as able, document, and provide log at future appointments -Recommended to continue current medication Will fu on BP at upcoming OV  Hyperlipidemia: (LDL goal < 70) -Controlled -Current treatment: Simvastatin 79m65mily Appropriate, Effective, Safe, Accessible -Medications previously tried: none noted  -Educated on Cholesterol goals;  Benefits of statin for ASCVD risk reduction; Importance of limiting foods high in cholesterol; -Recommended to continue current medication Most recent LDL is excellent, no adverse effects to medication Reinforced adherence, continue meds at this time  Diabetes (A1c goal <7%) -Controlled -Current medications: Novolog 30 units tid with meals Appropriate, Effective, Safe, Accessible Lantus 50 units every evening Appropriate, Effective, Safe, Accessible -Medications previously tried: Ozempic (nasuea)  -Current home glucose readings fasting  glucose: 159 post prandial glucose: 200 -Denies hypoglycemic/hyperglycemic symptoms -Educated on A1c and blood sugar goals; Prevention and management of hypoglycemic episodes; Ozempic mechanism of action, benefits on glucose and weight -Counseled to check feet daily and get yearly eye exams -Recommended to continue current medication Recheck A1c at upcoming visit, if elevated consider restarting Ozempic.  Patient Goals/Self-Care Activities Patient will:  - take medications as prescribed as evidenced by patient report and record review check glucose daily, document, and provide at future appointments  Follow Up Plan: The care management team will reach out to the patient again over the next 180 days.       Medication Assistance: None required.  Patient affirms current coverage meets needs.  Compliance/Adherence/Medication fill history: Care Gaps: Due for diabetic eye exam  Star-Rating Drugs: Simvastatin 07/26/21 90ds - patient has some at home Losartan 07/27/21 90ds  Patient's preferred pharmacy is:  We discussed: Benefits of medication synchronization, packaging and delivery as well as enhanced pharmacist oversight with [facility]. Patient decided to: Continue current medication management strategy  Care Plan and Follow Up Patient Decision:  Patient agrees to Care Plan and Follow-up.  Plan: The care management team will reach out to the patient again over the next 180 days.  ChriBeverly MilcharmD Clinical Pharmacist  [phone]

## 2021-12-10 ENCOUNTER — Ambulatory Visit (INDEPENDENT_AMBULATORY_CARE_PROVIDER_SITE_OTHER): Payer: Medicare Other | Admitting: Pharmacist

## 2021-12-10 DIAGNOSIS — E11319 Type 2 diabetes mellitus with unspecified diabetic retinopathy without macular edema: Secondary | ICD-10-CM

## 2021-12-10 DIAGNOSIS — E1169 Type 2 diabetes mellitus with other specified complication: Secondary | ICD-10-CM

## 2021-12-10 DIAGNOSIS — E785 Hyperlipidemia, unspecified: Secondary | ICD-10-CM

## 2021-12-10 DIAGNOSIS — E1159 Type 2 diabetes mellitus with other circulatory complications: Secondary | ICD-10-CM

## 2021-12-10 DIAGNOSIS — I152 Hypertension secondary to endocrine disorders: Secondary | ICD-10-CM

## 2021-12-10 NOTE — Patient Instructions (Addendum)
Visit Information   Goals Addressed             This Visit's Progress    Monitor and Manage My Blood Sugar-Diabetes Type 2       Timeframe:  Long-Range Goal Priority:  High Start Date:    12/10/21                         Expected End Date: 06/09/22                      Follow Up Date 03/10/22    - check blood sugar at prescribed times - check blood sugar before and after exercise - check blood sugar if I feel it is too high or too low    Why is this important?   Checking your blood sugar at home helps to keep it from getting very high or very low.  Writing the results in a diary or log helps the doctor know how to care for you.  Your blood sugar log should have the time, date and the results.  Also, write down the amount of insulin or other medicine that you take.  Other information, like what you ate, exercise done and how you were feeling, will also be helpful.     Notes:        Patient Care Plan: General Pharmacy (Adult)     Problem Identified: HTN, GERD, Type II DM, HLD, Hypothyroidism, Insomnia   Priority: High  Onset Date: 12/10/2021     Long-Range Goal: Patient-Specific Goal   Start Date: 12/10/2021  Expected End Date: 06/09/2022  This Visit's Progress: On track  Priority: High  Note:   Current Barriers:  Unable to achieve control of glucose   Pharmacist Clinical Goal(s):  Patient will achieve improvement in fasting glucose as evidenced by home monitoring through collaboration with PharmD and provider.   Interventions: 1:1 collaboration with [name], MD regarding development and update of comprehensive plan of care as evidenced by provider attestation and co-signature Inter-disciplinary care team collaboration (see longitudinal plan of care) Comprehensive medication review performed; medication list updated in electronic medical record  Hypertension (BP goal <130/80) -Controlled, based on recent office BP -Current treatment: Amlodipine 10mg   daily Appropriate, Effective, Safe, Accessible HCTZ 25mg  daily Appropriate, Effective, Safe, Accessible Losartan 100mg  daily Appropriate, Effective, Safe, Accessible -Medications previously tried: propranolol, chlorthalidone  -Current home readings: not checking -Denies hypotensive/hypertensive symptoms -Educated on Importance of home blood pressure monitoring; Symptoms of hypotension and importance of maintaining adequate hydration; -Counseled to monitor BP at home as able, document, and provide log at future appointments -Recommended to continue current medication Will fu on BP at upcoming OV  Hyperlipidemia: (LDL goal < 70) -Controlled -Current treatment: Simvastatin 80mg  daily Appropriate, Effective, Safe, Accessible -Medications previously tried: none noted  -Educated on Cholesterol goals;  Benefits of statin for ASCVD risk reduction; Importance of limiting foods high in cholesterol; -Recommended to continue current medication Most recent LDL is excellent, no adverse effects to medication Reinforced adherence, continue meds at this time  Diabetes (A1c goal <7%) -Controlled -Current medications: Novolog 30 units tid with meals Appropriate, Effective, Safe, Accessible Lantus 50 units every evening Appropriate, Effective, Safe, Accessible -Medications previously tried: Ozempic (nasuea)  -Current home glucose readings fasting glucose: 159 post prandial glucose: 200 -Denies hypoglycemic/hyperglycemic symptoms -Educated on A1c and blood sugar goals; Prevention and management of hypoglycemic episodes; Ozempic mechanism of action, benefits on glucose and weight -  Counseled to check feet daily and get yearly eye exams -Recommended to continue current medication Recheck A1c at upcoming visit, if elevated consider restarting Ozempic.  Patient Goals/Self-Care Activities Patient will:  - take medications as prescribed as evidenced by patient report and record review check glucose  daily, document, and provide at future appointments  Follow Up Plan: The care management team will reach out to the patient again over the next 180 days.       Patient verbalizes understanding of instructions provided today and agrees to view in [place].  Telephone follow up appointment with pharmacy team member scheduled for: 6 months  Edythe Clarity, [place], PharmD Clinical Pharmacist  [facility] [phone]

## 2021-12-11 DIAGNOSIS — Z5181 Encounter for therapeutic drug level monitoring: Secondary | ICD-10-CM | POA: Diagnosis not present

## 2021-12-11 DIAGNOSIS — G894 Chronic pain syndrome: Secondary | ICD-10-CM | POA: Diagnosis not present

## 2021-12-11 DIAGNOSIS — Z79899 Other long term (current) drug therapy: Secondary | ICD-10-CM | POA: Diagnosis not present

## 2021-12-12 DIAGNOSIS — Z8546 Personal history of malignant neoplasm of prostate: Secondary | ICD-10-CM | POA: Diagnosis not present

## 2021-12-12 DIAGNOSIS — N5231 Erectile dysfunction following radical prostatectomy: Secondary | ICD-10-CM | POA: Diagnosis not present

## 2021-12-12 DIAGNOSIS — N393 Stress incontinence (female) (male): Secondary | ICD-10-CM | POA: Diagnosis not present

## 2021-12-20 ENCOUNTER — Encounter: Payer: Self-pay | Admitting: Family Medicine

## 2021-12-20 ENCOUNTER — Ambulatory Visit (INDEPENDENT_AMBULATORY_CARE_PROVIDER_SITE_OTHER): Payer: Medicare Other | Admitting: Family Medicine

## 2021-12-20 ENCOUNTER — Other Ambulatory Visit: Payer: Self-pay

## 2021-12-20 VITALS — BP 128/70 | HR 76 | Temp 98.7°F | Ht 72.0 in | Wt 242.0 lb

## 2021-12-20 DIAGNOSIS — G20A1 Parkinson's disease without dyskinesia, without mention of fluctuations: Secondary | ICD-10-CM | POA: Insufficient documentation

## 2021-12-20 DIAGNOSIS — G20 Parkinson's disease: Secondary | ICD-10-CM | POA: Diagnosis not present

## 2021-12-20 DIAGNOSIS — D692 Other nonthrombocytopenic purpura: Secondary | ICD-10-CM

## 2021-12-20 DIAGNOSIS — E538 Deficiency of other specified B group vitamins: Secondary | ICD-10-CM | POA: Diagnosis not present

## 2021-12-20 DIAGNOSIS — E11319 Type 2 diabetes mellitus with unspecified diabetic retinopathy without macular edema: Secondary | ICD-10-CM

## 2021-12-20 DIAGNOSIS — E039 Hypothyroidism, unspecified: Secondary | ICD-10-CM | POA: Diagnosis not present

## 2021-12-20 DIAGNOSIS — C9110 Chronic lymphocytic leukemia of B-cell type not having achieved remission: Secondary | ICD-10-CM | POA: Diagnosis not present

## 2021-12-20 DIAGNOSIS — Z794 Long term (current) use of insulin: Secondary | ICD-10-CM

## 2021-12-20 HISTORY — DX: Parkinson's disease: G20

## 2021-12-20 HISTORY — DX: Parkinson's disease without dyskinesia, without mention of fluctuations: G20.A1

## 2021-12-20 MED ORDER — CYANOCOBALAMIN 1000 MCG/ML IJ SOLN
1000.0000 ug | Freq: Once | INTRAMUSCULAR | Status: AC
  Administered 2021-12-20: 1000 ug via INTRAMUSCULAR

## 2021-12-20 NOTE — Patient Instructions (Addendum)
Please stop by lab before you go If you have mychart- we will send your results within 3 business days of [place] receiving them.  If you do not have mychart- we will call you about results within 5 business days of [place] receiving them.  *please also note that you will see labs on mychart as soon as they post. I will later go in and write notes on them- will say "notes from Dr. Yong Channel"  I think it is reasonable for you to come off the ozempic.    Recommended follow up: Return in about 4 months (around 04/19/2022) for follow up- or sooner if needed.

## 2021-12-20 NOTE — Progress Notes (Signed)
Phone [phone] In person visit   Subjective:   [name] is a 81 y.o. year old very pleasant male patient who presents for/with See problem oriented charting Chief Complaint  Patient presents with   Follow-up   Diabetes   Hypertension   Hyperlipidemia   Hypothyroidism   This visit occurred during the SARS-CoV-2 public health emergency.  Safety protocols were in place, including screening questions prior to the visit, additional usage of staff PPE, and extensive cleaning of exam room while observing appropriate contact time as indicated for disinfecting solutions.   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[place]) 54/56/2563    Priority: High   Diabetic retinopathy ([place]) 09/24/2007    Priority: High   Tremor 08/11/2015    Priority: Medium    Hypothyroidism     Priority: Medium    Lapband APL + [place] repair 08/12/2013    Priority: Medium    Insomnia 09/21/2009    Priority: Medium    Hyperlipidemia associated with type 2 diabetes mellitus ([place]) 09/24/2007    Priority: Medium    OSA on CPAP 09/24/2007    Priority: Medium    Hypertension associated with diabetes ([place]) 09/24/2007    Priority: Medium    Bell's palsy 05/08/2020    Priority: Low   Personal history of skin cancer 04/02/2017    Priority: Low   GERD (gastroesophageal reflux disease) 12/08/2014    Priority: Low   History of colonic polyps 08/22/2008    Priority: Low   Parkinson's disease ([place]) 12/20/2021   Gynecomastia 12/28/2020   Vitamin D deficiency 11/01/2020   Senile purpura ([place]) 05/02/2020   Sacroiliac joint dysfunction 06/29/2018   Ear pressure, left 04/14/2017   Temporomandibular joint (TMJ) pain  04/14/2017   Pain syndrome, chronic 02/01/2016   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 myelopathy or radiculopathy 02/01/2016    Medications- reviewed and updated Current Outpatient Medications  Medication Sig Dispense Refill   amLODipine (NORVASC) 10 MG tablet Take 10 mg by mouth daily.     azelastine (ASTELIN) 0.1 % nasal spray Place 1 spray into both nostrils 2 (two) times daily. Use in each nostril as directed 30 mL 12   carbidopa-levodopa (SINEMET IR) 25-100 MG tablet Take 1 tablet by mouth 3 (three) times daily. 6am/10am/2pm 90 tablet 0   carbidopa-levodopa (SINEMET IR) 25-100 MG tablet Take 1 tablet by mouth 3 (three) times daily. 270 tablet 1   Cholecalciferol 50 MCG (2000 UT) TABS Take by mouth.     fluticasone (FLONASE) 50 MCG/ACT nasal spray Place into both nostrils.     glucose 4 GM chewable tablet Chew by mouth.     glucose blood test strip Use to test 4 times daily. E11.9 100 each 12   hydrochlorothiazide (HYDRODIURIL) 25 MG tablet Take 25 mg by mouth daily.     insulin aspart (NOVOLOG) 100 UNIT/ML injection Inject 30 Units into the skin 3 (three) times daily with meals. When sugar is elevated     insulin glargine (LANTUS) 100 UNIT/ML injection Inject 50 Units into the skin at bedtime.     lansoprazole (PREVACID) 30 MG capsule Take 1 capsule (30 mg total) by mouth daily at 12  noon. 30 capsule 5   levothyroxine (SYNTHROID, LEVOTHROID) 50 MCG tablet Take 50 mcg by mouth daily before breakfast.     losartan (COZAAR) 100 MG tablet Take 100 mg by mouth daily.     mirtazapine (REMERON) 15 MG tablet Take 15 mg by mouth at bedtime.     ondansetron (ZOFRAN ODT) 4 MG disintegrating tablet Take 1 tablet (4 mg total) by mouth every 8 (eight) hours as needed for nausea or vomiting. 20 tablet 0   OVER THE COUNTER MEDICATION PreserVision for eyes     potassium chloride SA (K-DUR,KLOR-CON) 20 MEQ tablet Take 20 mEq by mouth daily.     simvastatin (ZOCOR) 80 MG tablet Take half tablet (40 mg)  by mouth once daily     sucralfate (CARAFATE) 1 G tablet Take 1 g by mouth 4 (four) times daily -  with meals and at bedtime.     traMADol (ULTRAM) 50 MG tablet Take 1 tablet (50 mg total) by mouth every 6 (six) hours as needed. 10 tablet 0   No current facility-administered medications for this visit.     Objective:  BP 128/70    Pulse 76    Temp 98.7 F (37.1 C)    Ht 6' (1.829 m)    Wt 242 lb (109.8 kg)    SpO2 98%    BMI 32.82 kg/m  Gen: NAD, resting comfortably CV: RRR no murmurs rubs or gallops Lungs: CTAB no crackles, wheeze, rhonchi Ext: 1+ edema under compression stockings Skin: warm, dry Neuro: Resting tremor noted    Assessment and Plan   # Insomnia/anxiety S:patient reports was having significant anxiety. He was not sleeping well. Worked with [facility] doctor who noted he was not sleeping well. [facility] 5 mg and later 10 mg- didn't sleep well. He went down to [place] in [place]- they got him into [place] [facility]- saw them twice.  They weaned [place] and they placed him on 5 mg buspirone and mirtazapine 7.5 mg and and 5 mg ambien and that is working better  He is seeing [facility] [place]- Dr. Barnet Pall actually appears to be an endocrinologist  -also on gabapentin for back pain/nerve irritation A/P: significant issues and unclear what triggered this- continue current meds and psychiatry follow up   #Hyperlipidemia S: medication: Controlled on simvastatin 40 mg-half of 80 mg tablet [facility] also added gemfibrozil with triglycerides 379-currently on fenofibrate Lab Results  Component Value Date   CHOL 105 03/29/2021   HDL 42 03/29/2021   LDLCALC 11 03/29/2021   LDLDIRECT 47.0 11/05/2019   TRIG 258 (A) 03/29/2021   CHOLHDL 2 07/16/2017  A/P: Reasonably controlled although triglycerides have been slightly high-continue current medication  #Hypertension S: medication: Controlled on hydrochlorothiazide 25 mg, losartan 100 mg, amlodipine 10 mg (some edema in  past) -propranolol for tremor lowered HR today so he stopped this BP Readings from Last 3 Encounters:  12/20/21 128/70  09/24/21 126/64  09/12/21 122/76  A/P: Well-controlled on repeat-continue current medication   #Diabetes type 2 with ophthalmic complications- lifestyle meterb S: medication: Compliant with Lantus 65 units--> 50 units - came off ozempic.  - Also uses short acting insulin 30 units before meals. A1c goal 7.5 or less. -freestyle libre. No lows on fingerstick- when Elenor Legato goes off at 60 has been 90 on fingerprick Lab Results  Component Value Date   HGBA1C 6.2 09/05/2021   HGBA1C 6.5 03/29/2021   HGBA1C 6.9 09/18/2020  A/P: hopefully stable- update  a1c with labs- continue current meds. I agree with being off ozempic- want to avoid weight loss.   #Hypothyroidism S: medication: Compliant with levothyroxine 50 mcg Lab Results  Component Value Date   TSH 1.11 09/12/2021  A/P:  hopefully stable- update tsh today. Continue current meds for now  #B12 deficiency  S: history of gastric bypass . have recommended b12 1000 mcg per day- he prefers injections Lab Results  Component Value Date   VITAMINB12 1,138 (H) 09/05/2021  A/P: has been doing well-injection today  # Parkinson's Disease/essential tremor - follows with Dr. Carles Collet S:medication: Carbidopa-levodopa 25-100 mg Patient was seen 09/24/2021 for evaluation of a tremor - seen by Dr. Posey Pronto in 2018 and felt symptoms were most consistent with essential tremor - was started on propanolol 40 mg by Dr. Posey Pronto but was ultimately stopped after his heart rate was getting too low- patient stopped himself (pastor told him it was bad for his heart) - Dr. Carles Collet started patient on carbidopa/levodopa 25/100.  1/2 tab tid x 1 wk, then 1/2 in am & noon & 1 at night for a week, then 1/2 in am &1 at noon &night for a week, then 1 po tid  A/P: overall stable- continue current meds  #Patient with known CLL-continues to follow with Dr. Alen Blew.   Overall stable without evidence of transformation-check CBC with differential today and continue oncology follow-up  #senile purpura- noted. Stable. Check cbc at least annually. Platelets ok on last check  Recommended follow up: Return in about 4 months (around 04/19/2022) for follow up- or sooner if needed. Future Appointments  Date Time Provider [place]  12/31/2021  7:45 AM Hayden Pedro, MD [facility] None  01/31/2022  9:15 AM [facility] NURSE [facility] PEC  02/06/2022  9:00 AM [facility] LAB [facility] None  02/06/2022  9:30 AM Wyatt Portela, MD [facility] None  04/02/2022  3:00 PM Tat, Eustace Quail, DO [facility] None  06/10/2022  3:45 PM [facility] CCM PHARMACIST [facility] PEC  09/16/2022  9:15 AM [name], MD [facility]  11/15/2022 11:45 AM [facility] HEALTH COACH [facility]    Lab/Order associations:   ICD-10-CM   1. Type 2 diabetes mellitus with retinopathy of both eyes, with long-term current use of insulin, macular edema presence unspecified, unspecified retinopathy severity (HCC)  E11.319 CBC with Differential/Platelet   Z79.4 Comprehensive metabolic panel    Hemoglobin A1c    2. Parkinson's disease ([place])  G20     3. CLL (chronic lymphocytic leukemia) (HCC) Chronic C91.10     4. Senile purpura (HCC) Chronic D69.2     5. Hypothyroidism, unspecified type  E03.9 TSH    6. B12 deficiency  E53.8 cyanocobalamin ((VITAMIN B-12)) injection 1,000 mcg      Meds ordered this encounter  Medications   cyanocobalamin ((VITAMIN B-12)) injection 1,000 mcg   I,Harris Phan,acting as a scribe for [name], MD.,have documented all relevant documentation on the behalf of [name], MD,as directed by  [name], MD while in the presence of [name], MD.   I, [name], MD, have reviewed all documentation for this visit. The documentation on 12/20/21 for the exam, diagnosis, procedures, and orders are all accurate and complete.   Return precautions  advised.  [name], MD

## 2021-12-21 ENCOUNTER — Telehealth: Payer: Self-pay | Admitting: Family Medicine

## 2021-12-21 ENCOUNTER — Ambulatory Visit: Payer: Medicare Other | Admitting: Family Medicine

## 2021-12-21 LAB — COMPREHENSIVE METABOLIC PANEL
ALT: 26 U/L (ref 0–53)
AST: 26 U/L (ref 0–37)
Albumin: 4.4 g/dL (ref 3.5–5.2)
Alkaline Phosphatase: 58 U/L (ref 39–117)
BUN: 14 mg/dL (ref 6–23)
CO2: 28 mEq/L (ref 19–32)
Calcium: 9.2 mg/dL (ref 8.4–10.5)
Chloride: 104 mEq/L (ref 96–112)
Creatinine, Ser: 1.17 mg/dL (ref 0.40–1.50)
GFR: 59.77 mL/min — ABNORMAL LOW (ref >60.00)
Glucose, Bld: 147 mg/dL — ABNORMAL HIGH (ref 70–99)
Potassium: 4.4 mEq/L (ref 3.5–5.1)
Sodium: 142 mEq/L (ref 135–145)
Total Bilirubin: 0.9 mg/dL (ref 0.2–1.2)
Total Protein: 6.6 g/dL (ref 6.0–8.3)

## 2021-12-21 LAB — CBC WITH DIFFERENTIAL/PLATELET
Basophils Absolute: 0.1 10*3/uL (ref 0.0–0.1)
Basophils Relative: 0.5 % (ref 0.0–3.0)
Eosinophils Absolute: 0.4 10*3/uL (ref 0.0–0.7)
Eosinophils Relative: 1.4 % (ref 0.0–5.0)
HCT: 43.9 % (ref 39.0–52.0)
Hemoglobin: 14.4 g/dL (ref 13.0–17.0)
Lymphocytes Relative: 75.7 % — ABNORMAL HIGH (ref 12.0–46.0)
Lymphs Abs: 20 10*3/uL — ABNORMAL HIGH (ref 0.7–4.0)
MCHC: 32.9 g/dL (ref 30.0–36.0)
MCV: 86.5 fl (ref 78.0–100.0)
Monocytes Absolute: 0.7 10*3/uL (ref 0.1–1.0)
Monocytes Relative: 2.8 % — ABNORMAL LOW (ref 3.0–12.0)
Neutro Abs: 5.2 10*3/uL (ref 1.4–7.7)
Neutrophils Relative %: 19.6 % — ABNORMAL LOW (ref 43.0–77.0)
Platelets: 207 10*3/uL (ref 150.0–400.0)
RBC: 5.07 Mil/uL (ref 4.22–5.81)
RDW: 15.2 % (ref 11.5–15.5)
WBC: 26.4 10*3/uL (ref 4.0–10.5)

## 2021-12-21 LAB — TSH: TSH: 1.63 u[IU]/mL (ref 0.35–5.50)

## 2021-12-21 LAB — HEMOGLOBIN A1C: Hgb A1c MFr Bld: 6.9 % — ABNORMAL HIGH (ref 4.6–6.5)

## 2021-12-21 NOTE — Telephone Encounter (Signed)
See below

## 2021-12-21 NOTE — Telephone Encounter (Signed)
Critical Lab: High WBC Time:11:18

## 2021-12-21 NOTE — Telephone Encounter (Signed)
So his WBC at 26.4

## 2021-12-21 NOTE — Telephone Encounter (Signed)
Patient with known CLL- #s up- will forward result note to oncologist as Juluis Rainier

## 2021-12-24 ENCOUNTER — Encounter: Payer: Self-pay | Admitting: Oncology

## 2021-12-24 DIAGNOSIS — C4A61 Merkel cell carcinoma of right upper limb, including shoulder: Secondary | ICD-10-CM | POA: Diagnosis not present

## 2021-12-24 DIAGNOSIS — C9110 Chronic lymphocytic leukemia of B-cell type not having achieved remission: Secondary | ICD-10-CM | POA: Diagnosis not present

## 2021-12-31 ENCOUNTER — Other Ambulatory Visit: Payer: Self-pay

## 2021-12-31 ENCOUNTER — Encounter (INDEPENDENT_AMBULATORY_CARE_PROVIDER_SITE_OTHER): Payer: Medicare Other | Admitting: Ophthalmology

## 2021-12-31 DIAGNOSIS — I10 Essential (primary) hypertension: Secondary | ICD-10-CM

## 2021-12-31 DIAGNOSIS — H43813 Vitreous degeneration, bilateral: Secondary | ICD-10-CM

## 2021-12-31 DIAGNOSIS — H353231 Exudative age-related macular degeneration, bilateral, with active choroidal neovascularization: Secondary | ICD-10-CM | POA: Diagnosis not present

## 2021-12-31 DIAGNOSIS — H35033 Hypertensive retinopathy, bilateral: Secondary | ICD-10-CM | POA: Diagnosis not present

## 2022-01-01 DIAGNOSIS — Z794 Long term (current) use of insulin: Secondary | ICD-10-CM | POA: Diagnosis not present

## 2022-01-01 DIAGNOSIS — E785 Hyperlipidemia, unspecified: Secondary | ICD-10-CM | POA: Diagnosis not present

## 2022-01-01 DIAGNOSIS — E1159 Type 2 diabetes mellitus with other circulatory complications: Secondary | ICD-10-CM

## 2022-01-01 DIAGNOSIS — I10 Essential (primary) hypertension: Secondary | ICD-10-CM | POA: Diagnosis not present

## 2022-01-29 ENCOUNTER — Encounter (INDEPENDENT_AMBULATORY_CARE_PROVIDER_SITE_OTHER): Payer: Medicare Other | Admitting: Ophthalmology

## 2022-01-29 ENCOUNTER — Other Ambulatory Visit: Payer: Self-pay

## 2022-01-29 DIAGNOSIS — H43813 Vitreous degeneration, bilateral: Secondary | ICD-10-CM | POA: Diagnosis not present

## 2022-01-29 DIAGNOSIS — H35033 Hypertensive retinopathy, bilateral: Secondary | ICD-10-CM | POA: Diagnosis not present

## 2022-01-29 DIAGNOSIS — I10 Essential (primary) hypertension: Secondary | ICD-10-CM

## 2022-01-29 DIAGNOSIS — H353231 Exudative age-related macular degeneration, bilateral, with active choroidal neovascularization: Secondary | ICD-10-CM | POA: Diagnosis not present

## 2022-01-31 ENCOUNTER — Ambulatory Visit (INDEPENDENT_AMBULATORY_CARE_PROVIDER_SITE_OTHER): Payer: Medicare Other | Admitting: *Deleted

## 2022-01-31 ENCOUNTER — Other Ambulatory Visit: Payer: Self-pay

## 2022-01-31 DIAGNOSIS — E538 Deficiency of other specified B group vitamins: Secondary | ICD-10-CM

## 2022-01-31 MED ORDER — CYANOCOBALAMIN 1000 MCG/ML IJ SOLN
1000.0000 ug | Freq: Once | INTRAMUSCULAR | Status: AC
  Administered 2022-01-31: 1000 ug via INTRAMUSCULAR

## 2022-01-31 NOTE — Progress Notes (Signed)
Per orders of Dr. Yong Channel, injection of B 12 given in left deltoid per patient preference by Zacarias Pontes, CMA. Patient tolerated injection well.  ?

## 2022-01-31 NOTE — Progress Notes (Signed)
I have reviewed and agree with note, evaluation, plan.   Velera Lansdale, MD  

## 2022-02-06 ENCOUNTER — Inpatient Hospital Stay: Payer: Medicare Other | Attending: Oncology

## 2022-02-06 ENCOUNTER — Other Ambulatory Visit: Payer: Self-pay

## 2022-02-06 ENCOUNTER — Inpatient Hospital Stay (HOSPITAL_BASED_OUTPATIENT_CLINIC_OR_DEPARTMENT_OTHER): Payer: Medicare Other | Admitting: Oncology

## 2022-02-06 VITALS — BP 134/77 | HR 97 | Temp 98.8°F | Resp 19 | Ht 72.0 in | Wt 249.2 lb

## 2022-02-06 DIAGNOSIS — G20 Parkinson's disease: Secondary | ICD-10-CM | POA: Insufficient documentation

## 2022-02-06 DIAGNOSIS — C9110 Chronic lymphocytic leukemia of B-cell type not having achieved remission: Secondary | ICD-10-CM

## 2022-02-06 DIAGNOSIS — Z8546 Personal history of malignant neoplasm of prostate: Secondary | ICD-10-CM | POA: Diagnosis not present

## 2022-02-06 LAB — CBC WITH DIFFERENTIAL (CANCER CENTER ONLY)
Abs Immature Granulocytes: 0 10*3/uL (ref 0.00–0.07)
Basophils Absolute: 0 10*3/uL (ref 0.0–0.1)
Basophils Relative: 0 %
Eosinophils Absolute: 0.5 10*3/uL (ref 0.0–0.5)
Eosinophils Relative: 2 %
HCT: 43.2 % (ref 39.0–52.0)
Hemoglobin: 14.6 g/dL (ref 13.0–17.0)
Lymphocytes Relative: 70 %
Lymphs Abs: 18.3 10*3/uL — ABNORMAL HIGH (ref 0.7–4.0)
MCH: 28.7 pg (ref 26.0–34.0)
MCHC: 33.8 g/dL (ref 30.0–36.0)
MCV: 84.9 fL (ref 80.0–100.0)
Monocytes Absolute: 0.5 10*3/uL (ref 0.1–1.0)
Monocytes Relative: 2 %
Neutro Abs: 6.8 10*3/uL (ref 1.7–7.7)
Neutrophils Relative %: 26 %
Platelet Count: 227 10*3/uL (ref 150–400)
RBC: 5.09 MIL/uL (ref 4.22–5.81)
RDW: 13.6 % (ref 11.5–15.5)
Smear Review: NORMAL
WBC Count: 26.2 10*3/uL — ABNORMAL HIGH (ref 4.0–10.5)
nRBC: 0 % (ref 0.0–0.2)

## 2022-02-06 NOTE — Progress Notes (Signed)
Hematology and Oncology Follow Up Visit ? ?[name] ?[id number] ?Jan 26, 1941 81 y.o. ?02/06/2022 8:42 AM ?[name], MDHunter, [name], MD  ? ?Principle Diagnosis: 81 year old man with CLL diagnosed in 2015 with a lymphocytosis and adenopathy.  He did not have any indication for treatment and remains on active surveillance.   ? ? ?Secondary diagnosis:  ? ?Prostate cancer diagnosed in 2015.  He was found to have stage T1c, Gleason score 3+4 = 7 PSA of 6.8. ? 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 ? ?Prior Therapy: Status post robotic prostatectomy in February 2015 with the pathology revealed a Gleason score 3+4 = 7 without any evidence of extraprostatic extension.  ? ?He is a status post wide local excision and right axillary sentinel lymph node sampling completed on May 31, 2021 by Dr. Barry Dienes. ? ?Current therapy: Active surveillance. ? ?Interim History:  [name] is a for a follow-up visit.  Since last visit, he reports no major changes in his health.  He is recently recovered from COVID-19 infection and have recently recovered and gained weight that he is lost.  He denies any fevers chills or sweats.  He denies any lymphadenopathy or petechiae.  He denies any constitutional symptoms.  He remains ambulatory although limited by his Parkinson's. ? ? ? ? ?Medications: Reviewed without changes. ?Current Outpatient Medications  ?Medication Sig Dispense Refill  ? amLODipine (NORVASC) 10 MG tablet Take 10 mg by mouth daily.    ? azelastine (ASTELIN) 0.1 % nasal spray Place 1 spray into both nostrils 2 (two) times daily. Use in each nostril as directed 30 mL 12  ? carbidopa-levodopa (SINEMET IR) 25-100 MG tablet Take 1 tablet by mouth 3 (three) times daily. 6am/10am/2pm 90 tablet 0  ? carbidopa-levodopa (SINEMET IR) 25-100 MG tablet Take 1 tablet by mouth 3 (three) times daily. 270 tablet 1  ? Cholecalciferol 50 MCG (2000 UT) TABS Take by mouth.    ? fluticasone (FLONASE) 50 MCG/ACT  nasal spray Place into both nostrils.    ? glucose 4 GM chewable tablet Chew by mouth.    ? glucose blood test strip Use to test 4 times daily. E11.9 100 each 12  ? hydrochlorothiazide (HYDRODIURIL) 25 MG tablet Take 25 mg by mouth daily.    ? insulin aspart (NOVOLOG) 100 UNIT/ML injection Inject 30 Units into the skin 3 (three) times daily with meals. When sugar is elevated    ? insulin glargine (LANTUS) 100 UNIT/ML injection Inject 50 Units into the skin at bedtime.    ? lansoprazole (PREVACID) 30 MG capsule Take 1 capsule (30 mg total) by mouth daily at 12 noon. 30 capsule 5  ? levothyroxine (SYNTHROID, LEVOTHROID) 50 MCG tablet Take 50 mcg by mouth daily before breakfast.    ? losartan (COZAAR) 100 MG tablet Take 100 mg by mouth daily.    ? mirtazapine (REMERON) 15 MG tablet Take 15 mg by mouth at bedtime.    ? ondansetron (ZOFRAN ODT) 4 MG disintegrating tablet Take 1 tablet (4 mg total) by mouth every 8 (eight) hours as needed for nausea or vomiting. 20 tablet 0  ? OVER THE COUNTER MEDICATION PreserVision for eyes    ? potassium chloride SA (K-DUR,KLOR-CON) 20 MEQ tablet Take 20 mEq by mouth daily.    ? simvastatin (ZOCOR) 80 MG tablet Take half tablet (40 mg) by mouth once daily    ? sucralfate (CARAFATE) 1 G tablet Take 1 g by mouth 4 (four) times daily -  with meals and at bedtime.    ? traMADol (ULTRAM) 50 MG tablet Take 1 tablet (50 mg total) by mouth every 6 (six) hours as needed. 10 tablet 0  ? ?No current facility-administered medications for this visit.  ? ? ? ?Allergies:  ?Allergies  ?Allergen Reactions  ? Morphine Sulfate Itching and Rash  ? ? ? ? ? ?Physical exam: ? ? ?Blood pressure 134/77, pulse 97, temperature 98.8 ?F (37.1 ?C), temperature source Temporal, resp. rate 19, height 6' (1.829 m), weight 249 lb 3.2 oz (113 kg), SpO2 100 %. ? ? ? ?ECOG: 1 ? ? ? ? ?General appearance: Comfortable appearing without any discomfort ?Head: Normocephalic without any trauma ?Oropharynx: Mucous membranes are  moist and pink without any thrush or ulcers. ?Eyes: Pupils are equal and round reactive to light. ?Lymph nodes: No cervical, supraclavicular, inguinal or axillary lymphadenopathy.   ?Heart:regular rate and rhythm.  S1 and S2 without leg edema. ?Lung: Clear without any rhonchi or wheezes.  No dullness to percussion. ?Abdomin: Soft, nontender, nondistended with good bowel sounds.  No hepatosplenomegaly. ?Musculoskeletal: No joint deformity or effusion.  Full range of motion noted. ?Neurological: No deficits noted on motor, sensory and deep tendon reflex exam. ?Skin: No petechial rash or dryness.  Appeared moist.  ?Psychiatric: Mood and affect appeared appropriate. ? ? ? ? ?. ? ? ?Lab Results: ?Lab Results  ?Component Value Date  ? WBC 26.4 Repeated and verified X2. ([place]) 12/20/2021  ? HGB 14.4 12/20/2021  ? HCT 43.9 12/20/2021  ? MCV 86.5 12/20/2021  ? PLT 207.0 12/20/2021  ? ?  Chemistry   ?   ?Component Value Date/Time  ? NA 142 12/20/2021 1440  ? NA 139 07/26/2021 0000  ? NA 139 07/26/2021 0000  ? NA 142 10/30/2017 0845  ? K 4.4 12/20/2021 1440  ? K 3.7 10/30/2017 0845  ? CL 104 12/20/2021 1440  ? CO2 28 12/20/2021 1440  ? CO2 28 10/30/2017 0845  ? BUN 14 12/20/2021 1440  ? BUN 13 09/18/2020 0000  ? BUN 13.3 10/30/2017 0845  ? CREATININE 1.17 12/20/2021 1440  ? CREATININE 1.08 08/26/2019 0908  ? CREATININE 1.2 10/30/2017 0845  ? GLU 133 07/26/2021 0000  ?    ?Component Value Date/Time  ? CALCIUM 9.2 12/20/2021 1440  ? CALCIUM 9.5 10/30/2017 0845  ? ALKPHOS 58 12/20/2021 1440  ? ALKPHOS 53 10/30/2017 0845  ? AST 26 12/20/2021 1440  ? AST 31 08/26/2019 0908  ? AST 37 (H) 10/30/2017 0845  ? ALT 26 12/20/2021 1440  ? ALT 29 08/26/2019 0908  ? ALT 37 10/30/2017 0845  ? BILITOT 0.9 12/20/2021 1440  ? BILITOT 1.3 (H) 08/26/2019 0908  ? BILITOT 1.23 (H) 10/30/2017 0845  ?  ? ? ? ? ? ?Impression and Plan: ? ?81 year old man: ? ? ? ?1.  CLL presented with lymphocytosis and adenopathy in 2015.  He did not require treatment at  that time. ? ? ?His disease status was updated at this time and treatment choices were discussed.  Imaging studies in July 2022 did not show any bulky adenopathy.  For the time being I have recommended continued active surveillance and institute therapy if he developed painful adenopathy, cytopenias or rapid rise in his lymphocytosis.  CBC personally reviewed today and discussed with the patient and remains overall stable and no indication for treatment. ? ? ?2.  T1c, Gleason 7 prostate cancer diagnosed in 2015.  He continues to be on active surveillance without any relapse  disease. ? ? ?3.  T2N0 Merkel cell of the forearm diagnosed in June 2022.  The natural course of this disease was reviewed at this time and the risk of relapse was assessed.  No additional therapy is recommended unless he has relapsed disease and at that point immunotherapy could be considered.  I recommended updating his staging scans before the next visit.  He has declined this at this time and will continue to address in the future. ? ? ?4. Follow-up: In 6 months for repeat evaluation and imaging studies. ? ?30 minutes were dedicated to this encounter.  This time was spent on reviewing laboratory data, disease status update and outlining future plan of care discussion. ? ? ? ?Zola Button, MD ?3/8/20238:42 AM ?

## 2022-02-21 DIAGNOSIS — M25551 Pain in right hip: Secondary | ICD-10-CM | POA: Diagnosis not present

## 2022-02-21 DIAGNOSIS — M533 Sacrococcygeal disorders, not elsewhere classified: Secondary | ICD-10-CM | POA: Diagnosis not present

## 2022-02-21 DIAGNOSIS — G8929 Other chronic pain: Secondary | ICD-10-CM | POA: Diagnosis not present

## 2022-02-21 DIAGNOSIS — M47816 Spondylosis without myelopathy or radiculopathy, lumbar region: Secondary | ICD-10-CM | POA: Diagnosis not present

## 2022-02-28 ENCOUNTER — Encounter (INDEPENDENT_AMBULATORY_CARE_PROVIDER_SITE_OTHER): Payer: Medicare Other | Admitting: Ophthalmology

## 2022-02-28 ENCOUNTER — Ambulatory Visit (INDEPENDENT_AMBULATORY_CARE_PROVIDER_SITE_OTHER): Payer: Medicare Other | Admitting: *Deleted

## 2022-02-28 ENCOUNTER — Ambulatory Visit: Payer: Medicare Other | Admitting: Neurology

## 2022-02-28 DIAGNOSIS — H353231 Exudative age-related macular degeneration, bilateral, with active choroidal neovascularization: Secondary | ICD-10-CM

## 2022-02-28 DIAGNOSIS — H43813 Vitreous degeneration, bilateral: Secondary | ICD-10-CM | POA: Diagnosis not present

## 2022-02-28 DIAGNOSIS — I10 Essential (primary) hypertension: Secondary | ICD-10-CM | POA: Diagnosis not present

## 2022-02-28 DIAGNOSIS — E538 Deficiency of other specified B group vitamins: Secondary | ICD-10-CM

## 2022-02-28 DIAGNOSIS — H35033 Hypertensive retinopathy, bilateral: Secondary | ICD-10-CM | POA: Diagnosis not present

## 2022-02-28 DIAGNOSIS — H35372 Puckering of macula, left eye: Secondary | ICD-10-CM

## 2022-02-28 MED ORDER — CYANOCOBALAMIN 1000 MCG/ML IJ SOLN
1000.0000 ug | Freq: Once | INTRAMUSCULAR | Status: AC
  Administered 2022-02-28: 1000 ug via INTRAMUSCULAR

## 2022-02-28 NOTE — Progress Notes (Signed)
I have reviewed and agree with note, evaluation, plan.   Ronita Hargreaves, MD  

## 2022-02-28 NOTE — Progress Notes (Signed)
Per orders of Dr. Yong Channel, injection of B12 given in left deltoid per patient preference by Zacarias Pontes, CMA. Patient tolerated injection well.  ?

## 2022-03-28 ENCOUNTER — Encounter (INDEPENDENT_AMBULATORY_CARE_PROVIDER_SITE_OTHER): Payer: Medicare Other | Admitting: Ophthalmology

## 2022-03-29 ENCOUNTER — Encounter (INDEPENDENT_AMBULATORY_CARE_PROVIDER_SITE_OTHER): Payer: Medicare Other | Admitting: Ophthalmology

## 2022-03-29 DIAGNOSIS — I10 Essential (primary) hypertension: Secondary | ICD-10-CM

## 2022-03-29 DIAGNOSIS — H353231 Exudative age-related macular degeneration, bilateral, with active choroidal neovascularization: Secondary | ICD-10-CM

## 2022-03-29 DIAGNOSIS — H43813 Vitreous degeneration, bilateral: Secondary | ICD-10-CM | POA: Diagnosis not present

## 2022-03-29 DIAGNOSIS — H35033 Hypertensive retinopathy, bilateral: Secondary | ICD-10-CM | POA: Diagnosis not present

## 2022-03-31 ENCOUNTER — Encounter: Payer: Self-pay | Admitting: Family Medicine

## 2022-04-01 NOTE — Progress Notes (Signed)
? ? ?Assessment/Plan:  ? ?1.  Parkinsons Disease ? -Continue carbidopa/levodopa 25/100, 1 tablet 3 times per day.  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  Tremor is very bothersome to patient and he very much wants surgical intervention.  He and I had a long discussion regarding surgery, and it is not something to jump into lightly.  We discussed that DBS is often not done early on in disease, and that DBS does not do better than medication when it is working its best (however, tremor is an exception to this rule).  Discussed with patient that many things need to be done prior to considerations for surgery, not just medication trials, but neurocognitive testing and levodopa challenges.  In addition, discussed with him that he would really need to do physical therapy and show [place] that he is interested in investing in his own health.  He was at least agreeing to trialing physical therapy at the home today. ? -In addition to the above, the patient, his wife and I discussed extensively risks associated with DBS, including infection, bleeding, stroke, seizure, death.  Patient would like to proceed, but discussed that proceeding means proceeding with cognitive testing. ? -He was agreeable to neurocognitive testing.  I am concerned that anxiety and depression could be an issue here.  He is being treated at the [facility], although he does look better than he did last visit. ? -Continue to follow with Dr. Denna Haggard.  Parkinson's slightly increases risk for melanoma. ? ?2.  Anxiety ? -Following with psychiatry at [facility]. ? -[facility], 7.5 mg at bed ? ?3.  Hypertension ? -On several antihypertensives including hydrochlorothiazide, losartan, amlodipine.  We will need to watch this in the future given the nature of Parkinson's disease for causing dysautonomia. ? ?4.  Diabetes, type II, insulin-dependent ? -Managed by Dr. Yong Channel. ?Subjective:  ? ?[name] was seen today in follow up  for Parkinsons disease, diagnosed last visit..  My previous records were reviewed prior to todays visit as well as outside records available to me.  Last visit, the patient declined MRI of the brain and declined physical therapy.  He was started on levodopa.  He reports today that its not helping.  pt had a fall into the christmas tree at [place] and entire thing came down.  He didn't get hurt.  This was his only fall.  Pt denies lightheadedness, near syncope.  No hallucinations.  Patient is following with the [facility] for anxiety and insomnia.  He is on BuSpar and low-dose mirtazapine, 7.5 mg.  He asks multiple times about dbs - "I want that operation."   ? ?Current prescribed movement disorder medications: ?Carbidopa/levodopa 25/100, 1 tablet 3 times per day (started last visit) ?Mirtazapine, 7.5 mg nightly (by [facility]) ? ? ?ALLERGIES:   ?Allergies  ?Allergen Reactions  ? Morphine Sulfate Itching and Rash  ? ? ?CURRENT MEDICATIONS:  ?Current Meds  ?Medication Sig  ? amLODipine (NORVASC) 10 MG tablet Take 10 mg by mouth daily.  ? amoxicillin-clavulanate (AUGMENTIN) 875-125 MG tablet Take 1 tablet by mouth 2 (two) times daily.  ? azelastine (ASTELIN) 0.1 % nasal spray Place 1 spray into both nostrils 2 (two) times daily. Use in each nostril as directed  ? carbidopa-levodopa (SINEMET IR) 25-100 MG tablet Take 1 tablet by mouth 3 (three) times daily. 6am/10am/2pm  ? Cholecalciferol 50 MCG (2000 UT) TABS Take by mouth.  ? fluticasone (FLONASE) 50 MCG/ACT nasal spray Place  into both nostrils.  ? gabapentin (NEURONTIN) 300 MG capsule Take 3 caps in PM and 3 caps at bedtime and continue.  ? glucose 4 GM chewable tablet Chew by mouth.  ? glucose blood test strip Use to test 4 times daily. E11.9  ? glucose-Vitamin C 4-0.006 GM CHEW chewable tablet Chew by mouth.  ? hydrochlorothiazide (HYDRODIURIL) 25 MG tablet Take 25 mg by mouth daily.  ? hydrocortisone 2.5 % cream SMARTSIG:1 Topical Daily  ? hydrocortisone 2.5 %  cream SMARTSIG:1 Topical Daily  ? hydrOXYzine (ATARAX) 10 MG tablet Take 10 mg by mouth 3 (three) times daily.  ? insulin aspart (NOVOLOG) 100 UNIT/ML injection Inject 30 Units into the skin 3 (three) times daily with meals. When sugar is elevated  ? insulin glargine (LANTUS) 100 UNIT/ML injection Inject 50 Units into the skin at bedtime.  ? lansoprazole (PREVACID) 30 MG capsule Take 1 capsule (30 mg total) by mouth daily at 12 noon.  ? levothyroxine (SYNTHROID, LEVOTHROID) 50 MCG tablet Take 50 mcg by mouth daily before breakfast.  ? losartan (COZAAR) 100 MG tablet Take 100 mg by mouth daily.  ? mirtazapine (REMERON) 15 MG tablet Take 15 mg by mouth at bedtime.  ? ondansetron (ZOFRAN ODT) 4 MG disintegrating tablet Take 1 tablet (4 mg total) by mouth every 8 (eight) hours as needed for nausea or vomiting.  ? OVER THE COUNTER MEDICATION PreserVision for eyes  ? potassium chloride SA (K-DUR,KLOR-CON) 20 MEQ tablet Take 20 mEq by mouth daily.  ? simvastatin (ZOCOR) 80 MG tablet Take half tablet (40 mg) by mouth once daily  ? sucralfate (CARAFATE) 1 G tablet Take 1 g by mouth 4 (four) times daily -  with meals and at bedtime.  ? traMADol (ULTRAM) 50 MG tablet Take 1 tablet (50 mg total) by mouth every 6 (six) hours as needed.  ? traZODone (DESYREL) 50 MG tablet Take 25 mg by mouth at bedtime as needed.  ? trimethoprim-polymyxin b (POLYTRIM) ophthalmic solution Apply to eye.  ? zolpidem (AMBIEN) 5 MG tablet Take 5 mg by mouth at bedtime as needed for sleep.  ? ? ? ?Objective:  ? ?PHYSICAL EXAMINATION:   ? ?VITALS:   ?Vitals:  ? 04/02/22 1427  ?BP: 135/75  ?Pulse: 71  ?SpO2: 99%  ?Weight: 258 lb (117 kg)  ?Height: 6' (1.829 m)  ? ? ?GEN:  The patient appears stated age and is in NAD. ?HEENT:  Normocephalic, atraumatic.  The mucous membranes are moist. The superficial temporal arteries are without ropiness or tenderness. ?CV:  RRR ?Lungs:  CTAB ?Neck/HEME:  There are no carotid bruits bilaterally. ? ?Neurological  examination: ? ?Orientation: The patient is alert and oriented x3. ?Cranial nerves: There is good facial symmetry with significant facial hypomimia. The speech is fluent and clear. Soft palate rises symmetrically and there is no tongue deviation. Hearing is intact to conversational tone. ?Sensation: Sensation is intact to light touch throughout ?Motor: Strength is at least antigravity x4. ? ?Movement examination: ?Tone: There is normal tone in the upper and lower extremities. ?Abnormal movements: there is RUE rest tremor, moderate, and very mild left upper extremity rest tremor, intermittent.  This is about the same as last visit. ?Coordination:  There is mild decremation with RAM's, with any form of RAMS, including alternating supination and pronation of the forearm, hand opening and closing, finger taps, heel taps and toe taps, r>L ?Gait and Station: The patient pushes off of the chair to arise. The patient's stride length is  just slightly decreased and slightly drags the R leg.  He is short stepped ? ?I have reviewed and interpreted the following labs independently ? ?  Chemistry   ?   ?Component Value Date/Time  ? NA 142 12/20/2021 1440  ? NA 139 07/26/2021 0000  ? NA 139 07/26/2021 0000  ? NA 142 10/30/2017 0845  ? K 4.4 12/20/2021 1440  ? K 3.7 10/30/2017 0845  ? CL 104 12/20/2021 1440  ? CO2 28 12/20/2021 1440  ? CO2 28 10/30/2017 0845  ? BUN 14 12/20/2021 1440  ? BUN 13 09/18/2020 0000  ? BUN 13.3 10/30/2017 0845  ? CREATININE 1.17 12/20/2021 1440  ? CREATININE 1.08 08/26/2019 0908  ? CREATININE 1.2 10/30/2017 0845  ? GLU 133 07/26/2021 0000  ?    ?Component Value Date/Time  ? CALCIUM 9.2 12/20/2021 1440  ? CALCIUM 9.5 10/30/2017 0845  ? ALKPHOS 58 12/20/2021 1440  ? ALKPHOS 53 10/30/2017 0845  ? AST 26 12/20/2021 1440  ? AST 31 08/26/2019 0908  ? AST 37 (H) 10/30/2017 0845  ? ALT 26 12/20/2021 1440  ? ALT 29 08/26/2019 0908  ? ALT 37 10/30/2017 0845  ? BILITOT 0.9 12/20/2021 1440  ? BILITOT 1.3 (H)  08/26/2019 0908  ? BILITOT 1.23 (H) 10/30/2017 0845  ?  ? ? ? ?Lab Results  ?Component Value Date  ? WBC 26.2 (H) 02/06/2022  ? HGB 14.6 02/06/2022  ? HCT 43.2 02/06/2022  ? MCV 84.9 02/06/2022  ? PLT 227 02/06/2022  ? ?

## 2022-04-02 ENCOUNTER — Ambulatory Visit: Payer: Medicare Other

## 2022-04-02 ENCOUNTER — Encounter: Payer: Self-pay | Admitting: Family Medicine

## 2022-04-02 ENCOUNTER — Ambulatory Visit (INDEPENDENT_AMBULATORY_CARE_PROVIDER_SITE_OTHER): Payer: Medicare Other | Admitting: Neurology

## 2022-04-02 ENCOUNTER — Encounter: Payer: Self-pay | Admitting: Neurology

## 2022-04-02 ENCOUNTER — Ambulatory Visit (INDEPENDENT_AMBULATORY_CARE_PROVIDER_SITE_OTHER): Payer: Medicare Other | Admitting: Family Medicine

## 2022-04-02 VITALS — BP 135/75 | HR 71 | Ht 72.0 in | Wt 258.0 lb

## 2022-04-02 VITALS — BP 140/70 | HR 78 | Temp 99.1°F | Ht 72.0 in | Wt 260.0 lb

## 2022-04-02 DIAGNOSIS — E538 Deficiency of other specified B group vitamins: Secondary | ICD-10-CM | POA: Diagnosis not present

## 2022-04-02 DIAGNOSIS — R413 Other amnesia: Secondary | ICD-10-CM | POA: Diagnosis not present

## 2022-04-02 DIAGNOSIS — H7091 Unspecified mastoiditis, right ear: Secondary | ICD-10-CM

## 2022-04-02 DIAGNOSIS — G20 Parkinson's disease: Secondary | ICD-10-CM

## 2022-04-02 MED ORDER — AMOXICILLIN-POT CLAVULANATE 875-125 MG PO TABS: 1.0000 | ORAL_TABLET | Freq: Two times a day (BID) | ORAL | 0 refills | Status: DC

## 2022-04-02 MED ORDER — CYANOCOBALAMIN 1000 MCG/ML IJ SOLN
1000.0000 ug | Freq: Once | INTRAMUSCULAR | Status: AC
  Administered 2022-04-02: 1000 ug via INTRAMUSCULAR

## 2022-04-02 NOTE — Patient Instructions (Signed)
You have been referred for a neurocognitive evaluation (i.e., evaluation of memory and thinking abilities). Please bring someone with you to this appointment if possible, as it is helpful for the neuropsychologist to hear from both you and another adult who knows you well. Please bring eyeglasses and hearing aids if you wear them and take any medications as you normally would.    The evaluation will take approximately 2-3 hours and has two parts:   The first part is a clinical interview with the neuropsychologist, Dr. Merz.  During the interview, the neuropsychologist will speak with you and the individual you brought to the appointment.    The second part of the evaluation is testing with the doctor's technician, aka psychometrician, Dana or Kim. During the testing, the technician will ask you to remember different types of material, solve problems, and answer some questionnaires. Your family member will not be present for this portion of the evaluation.   Please note: We have to reserve several hours of the neuropsychologist's time and the psychometrician's time for your evaluation appointment. As such, there is a No-Show fee of $100. If you are unable to attend any of your appointments, please contact our office as soon as possible to reschedule.  

## 2022-04-02 NOTE — Patient Instructions (Signed)
It was very nice to see you today! ? ?If antibiotics not helping, getting worse, let us know.   ? ? ?PLEASE NOTE: ? ?If you had any lab tests please let us know if you have not heard back within a few days. You may see your results on MyChart before we have a chance to review them but we will give you a call once they are reviewed by [place]. If we ordered any referrals today, please let us know if you have not heard from their office within the next week.  ? ?Please try these tips to maintain a healthy lifestyle: ? ?Eat most of your calories during the day when you are active. Eliminate processed foods including packaged sweets (pies, cakes, cookies), reduce intake of potatoes, white bread, white pasta, and white rice. Look for whole grain options, oat flour or almond flour. ? ?Each meal should contain half fruits/vegetables, one quarter protein, and one quarter carbs (no bigger than a computer mouse). ? ?Cut down on sweet beverages. This includes juice, soda, and sweet tea. Also watch fruit intake, though this is a healthier sweet option, it still contains natural sugar! Limit to 3 servings daily. ? ?Drink at least 1 glass of water with each meal and aim for at least 8 glasses per day ? ?Exercise at least 150 minutes every week.   ?

## 2022-04-02 NOTE — Progress Notes (Signed)
? ?Subjective:  ? ? ? Patient ID: [name], male    DOB: 1941-10-25, 81 y.o.   MRN: [id number] ? ?Chief Complaint  ?Patient presents with  ? Soreness  ?  Sore bone behind right ear that started 3 days ago ?  ? ? ?HPI ?Soreness behind R ear for 3 days.  Has had in past-abx worked-about1 yr ago.  No drainage. No f/c. No sore throat.  Some hoarseness.   ? ?Health Maintenance Due  ?Topic Date Due  ? Zoster Vaccines- Shingrix (1 of 2) Never done  ? OPHTHALMOLOGY EXAM  01/02/2021  ? COVID-19 Vaccine (6 - Booster for Pfizer series) 10/20/2021  ? COLONOSCOPY (Pts 45-20yr Insurance coverage will need to be confirmed)  02/26/2022  ? ? ?Past Medical History:  ?Diagnosis Date  ? Arthritis   ? Atypical mole 04/02/1999  ? (MODERATE) LEFT BACK WAIST LINE DR GTonia Brooms ? Basal cell carcinoma 03/18/2017  ? LEFT NECK CX3 5FU  ? Cataract   ? Colon polyps   ? DDD (degenerative disc disease), lumbar   ? Depression   ? PTSD  ? Diabetes mellitus   ? Diabetic retinopathy   ? Diverticulosis of colon   ? Elevated PSA   ? ERECTILE DYSFUNCTION 12/30/2007  ? No rx.     ? GERD (gastroesophageal reflux disease)   ? Hyperlipidemia   ? Hypertension   ? Hypothyroidism   ? IBS (irritable bowel syndrome)   ? Leukemia ([facility] 01/2014  ? Macular degeneration   ? followed by ophthalmology  ? 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 Nevus, atypical 06/01/2001  ? (MODERTE) RIGHT BUTTOCKS WIDER SHAVE DR GRUBER  ? OSA (obstructive sleep apnea)   ? PONV (postoperative nausea and vomiting)   ? Prostate cancer ([facility]   ? PTSD (post-traumatic stress disorder)   ? [facility]  ? Sleep apnea   ? Tremors of nervous system   ? ? ?Past Surgical History:  ?Procedure Laterality Date  ? CHOLECYSTECTOMY    ? COLONOSCOPY  03/25/12, 09/28/08  ? ESOPHAGOGASTRODUODENOSCOPY N/A 10/12/2014  ? Procedure: ESOPHAGOGASTRODUODENOSCOPY (EGD);  Surgeon: CGatha Mayer MD;  [facility];  Service: Endoscopy;  Laterality: N/A;  ? ESOPHAGOGASTRODUODENOSCOPY  ENDOSCOPY  09/28/08  ? EXCISION MELANOMA WITH SENTINEL LYMPH NODE BIOPSY Right 05/31/2021  ? Procedure: WIDE LOCAL EXCISION RIGHT FOREARM WITH ADVANCEDMENT FLAP CLOSURE WITH SENTINEL LYMPH NODE MAPPING AND BIOSPY;  Surgeon: BStark Klein MD;  [facility]  Service: General;  Laterality: Right;  ? FOOT SURGERY Bilateral   ? HEMORRHOID SURGERY    ? LAPAROSCOPIC GASTRIC BANDING  03/05/11  ? weight loss  ? LAPAROSCOPIC GASTRIC BANDING WITH HIATAL HERNIA REPAIR  03/05/2011  ? LYMPHADENECTOMY Bilateral 01/24/2014  ? Procedure: LYMPHADENECTOMY;  Surgeon: LDutch Gray MD;  [facility]: [facility];  Service: Urology;  Laterality: Bilateral;  ? ORIF TIBIA FRACTURE Right   ? PENILE PROSTHESIS IMPLANT    ? PROSTATE SURGERY  01/2014  ? ROBOT ASSISTED LAPAROSCOPIC RADICAL PROSTATECTOMY N/A 01/24/2014  ? Procedure: ROBOTIC ASSISTED LAPAROSCOPIC RADICAL PROSTATECTOMY LEVEL 3;  Surgeon: LDutch Gray MD;  Location: [facility];  Service: Urology;  Laterality: N/A;  ? SHOULDER SURGERY Right 2011  ? TONSILLECTOMY    ? age [age] ? VASECTOMY    ? ? ?Outpatient Medications Prior to Visit  ?Medication Sig Dispense Refill  ? amLODipine (NORVASC) 10 MG tablet Take 10 mg by mouth daily.    ? azelastine (ASTELIN)  0.1 % nasal spray Place 1 spray into both nostrils 2 (two) times daily. Use in each nostril as directed 30 mL 12  ? carbidopa-levodopa (SINEMET IR) 25-100 MG tablet Take 1 tablet by mouth 3 (three) times daily. 6am/10am/2pm 90 tablet 0  ? carbidopa-levodopa (SINEMET IR) 25-100 MG tablet Take 1 tablet by mouth 3 (three) times daily. 270 tablet 1  ? Cholecalciferol 50 MCG (2000 UT) TABS Take by mouth.    ? fluticasone (FLONASE) 50 MCG/ACT nasal spray Place into both nostrils.    ? gabapentin (NEURONTIN) 300 MG capsule Take 3 caps in PM and 3 caps at bedtime and continue.    ? glucose 4 GM chewable tablet Chew by mouth.    ? glucose blood test strip Use to test 4 times daily. E11.9 100 each 12  ? glucose-Vitamin C 4-0.006 GM CHEW  chewable tablet Chew by mouth.    ? hydrochlorothiazide (HYDRODIURIL) 25 MG tablet Take 25 mg by mouth daily.    ? hydrocortisone 2.5 % cream SMARTSIG:1 Topical Daily    ? hydrocortisone 2.5 % cream SMARTSIG:1 Topical Daily    ? hydrOXYzine (ATARAX) 10 MG tablet Take 10 mg by mouth 3 (three) times daily.    ? insulin aspart (NOVOLOG) 100 UNIT/ML injection Inject 30 Units into the skin 3 (three) times daily with meals. When sugar is elevated    ? insulin glargine (LANTUS) 100 UNIT/ML injection Inject 50 Units into the skin at bedtime.    ? lansoprazole (PREVACID) 30 MG capsule Take 1 capsule (30 mg total) by mouth daily at 12 noon. 30 capsule 5  ? levothyroxine (SYNTHROID, LEVOTHROID) 50 MCG tablet Take 50 mcg by mouth daily before breakfast.    ? losartan (COZAAR) 100 MG tablet Take 100 mg by mouth daily.    ? mirtazapine (REMERON) 15 MG tablet Take 15 mg by mouth at bedtime.    ? ondansetron (ZOFRAN ODT) 4 MG disintegrating tablet Take 1 tablet (4 mg total) by mouth every 8 (eight) hours as needed for nausea or vomiting. 20 tablet 0  ? OVER THE COUNTER MEDICATION PreserVision for eyes    ? potassium chloride SA (K-DUR,KLOR-CON) 20 MEQ tablet Take 20 mEq by mouth daily.    ? simvastatin (ZOCOR) 80 MG tablet Take half tablet (40 mg) by mouth once daily    ? sucralfate (CARAFATE) 1 G tablet Take 1 g by mouth 4 (four) times daily -  with meals and at bedtime.    ? traMADol (ULTRAM) 50 MG tablet Take 1 tablet (50 mg total) by mouth every 6 (six) hours as needed. 10 tablet 0  ? traZODone (DESYREL) 50 MG tablet Take 25 mg by mouth at bedtime as needed.    ? trimethoprim-polymyxin b (POLYTRIM) ophthalmic solution Apply to eye.    ? zolpidem (AMBIEN) 5 MG tablet Take 5 mg by mouth at bedtime as needed for sleep.    ? ?No facility-administered medications prior to visit.  ? ? ?Allergies  ?Allergen Reactions  ? Morphine Sulfate Itching and Rash  ? ?ROS neg/noncontributory except as noted HPI/below ? ? ?   ?Objective:  ?   ? ?BP 140/70   Pulse 78   Temp 99.1 ?F (37.3 ?C) (Temporal)   Ht 6' (1.829 m)   Wt 260 lb (117.9 kg)   SpO2 97%   BMI 35.26 kg/m?  ?Wt Readings from Last 3 Encounters:  ?04/02/22 260 lb (117.9 kg)  ?02/06/22 249 lb 3.2 oz (113 kg)  ?12/20/21  242 lb (109.8 kg)  ? ? ?Physical Exam  ? ?Gen: [facility] NAD ?HEENT: NCAT, conjunctiva not injected, sclera nonicteric ?TM WNL B, OP moist, no exudates .  Some minimal swelling and tenderness R mastoid area.  No redness.   ?NECK:  supple, no thyromegaly, no nodes, no carotid bruits ?CARDIAC: RRR, S1S2+, no murmur. ?LUNGS: CTAB. No wheezes ?EXT:  no edema ?MSK: no gross abnormalities.  ?NEURO: A&O x3.  CN II-XII intact.  ?PSYCH: normal mood. Good eye contact ?Tremors-pill rolling esp R arm/hand.  Shuffling gait.  Flat affect ? ?   ?Assessment & Plan:  ? ?Problem List Items Addressed This Visit   ?None ?Visit Diagnoses   ? ? Mastoiditis of right side    -  Primary  ? B12 deficiency      ? Relevant Medications  ? cyanocobalamin ((VITAMIN B-12)) injection 1,000 mcg (Completed)  ? ?  ? R mastoiditis-augmentin.  Worse, no change, f/u as has leukemia.   ?B12 deficiency-nurse gave injection today. ? ?Meds ordered this encounter  ?Medications  ? cyanocobalamin ((VITAMIN B-12)) injection 1,000 mcg  ? amoxicillin-clavulanate (AUGMENTIN) 875-125 MG tablet  ?  Sig: Take 1 tablet by mouth 2 (two) times daily.  ?  Dispense:  20 tablet  ?  Refill:  0  ? ? ?Wellington Hampshire, MD ? ?

## 2022-04-10 DIAGNOSIS — K219 Gastro-esophageal reflux disease without esophagitis: Secondary | ICD-10-CM | POA: Diagnosis not present

## 2022-04-10 DIAGNOSIS — F419 Anxiety disorder, unspecified: Secondary | ICD-10-CM | POA: Diagnosis not present

## 2022-04-10 DIAGNOSIS — M533 Sacrococcygeal disorders, not elsewhere classified: Secondary | ICD-10-CM | POA: Diagnosis not present

## 2022-04-10 DIAGNOSIS — G20 Parkinson's disease: Secondary | ICD-10-CM | POA: Diagnosis not present

## 2022-04-10 DIAGNOSIS — Z85828 Personal history of other malignant neoplasm of skin: Secondary | ICD-10-CM | POA: Diagnosis not present

## 2022-04-10 DIAGNOSIS — E1159 Type 2 diabetes mellitus with other circulatory complications: Secondary | ICD-10-CM | POA: Diagnosis not present

## 2022-04-10 DIAGNOSIS — D692 Other nonthrombocytopenic purpura: Secondary | ICD-10-CM | POA: Diagnosis not present

## 2022-04-10 DIAGNOSIS — E039 Hypothyroidism, unspecified: Secondary | ICD-10-CM | POA: Diagnosis not present

## 2022-04-10 DIAGNOSIS — E559 Vitamin D deficiency, unspecified: Secondary | ICD-10-CM | POA: Diagnosis not present

## 2022-04-10 DIAGNOSIS — Z8546 Personal history of malignant neoplasm of prostate: Secondary | ICD-10-CM | POA: Diagnosis not present

## 2022-04-10 DIAGNOSIS — G4700 Insomnia, unspecified: Secondary | ICD-10-CM | POA: Diagnosis not present

## 2022-04-10 DIAGNOSIS — G510 Bell's palsy: Secondary | ICD-10-CM | POA: Diagnosis not present

## 2022-04-10 DIAGNOSIS — N62 Hypertrophy of breast: Secondary | ICD-10-CM | POA: Diagnosis not present

## 2022-04-10 DIAGNOSIS — Z856 Personal history of leukemia: Secondary | ICD-10-CM | POA: Diagnosis not present

## 2022-04-10 DIAGNOSIS — E785 Hyperlipidemia, unspecified: Secondary | ICD-10-CM | POA: Diagnosis not present

## 2022-04-10 DIAGNOSIS — I152 Hypertension secondary to endocrine disorders: Secondary | ICD-10-CM | POA: Diagnosis not present

## 2022-04-10 DIAGNOSIS — G4733 Obstructive sleep apnea (adult) (pediatric): Secondary | ICD-10-CM | POA: Diagnosis not present

## 2022-04-10 DIAGNOSIS — Z794 Long term (current) use of insulin: Secondary | ICD-10-CM | POA: Diagnosis not present

## 2022-04-10 DIAGNOSIS — E11319 Type 2 diabetes mellitus with unspecified diabetic retinopathy without macular edema: Secondary | ICD-10-CM | POA: Diagnosis not present

## 2022-04-10 DIAGNOSIS — Z86010 Personal history of colonic polyps: Secondary | ICD-10-CM | POA: Diagnosis not present

## 2022-04-10 DIAGNOSIS — G894 Chronic pain syndrome: Secondary | ICD-10-CM | POA: Diagnosis not present

## 2022-04-10 DIAGNOSIS — H938X2 Other specified disorders of left ear: Secondary | ICD-10-CM | POA: Diagnosis not present

## 2022-04-10 DIAGNOSIS — M47816 Spondylosis without myelopathy or radiculopathy, lumbar region: Secondary | ICD-10-CM | POA: Diagnosis not present

## 2022-04-10 DIAGNOSIS — E1169 Type 2 diabetes mellitus with other specified complication: Secondary | ICD-10-CM | POA: Diagnosis not present

## 2022-04-12 DIAGNOSIS — E1159 Type 2 diabetes mellitus with other circulatory complications: Secondary | ICD-10-CM | POA: Diagnosis not present

## 2022-04-12 DIAGNOSIS — I152 Hypertension secondary to endocrine disorders: Secondary | ICD-10-CM | POA: Diagnosis not present

## 2022-04-12 DIAGNOSIS — G510 Bell's palsy: Secondary | ICD-10-CM | POA: Diagnosis not present

## 2022-04-12 DIAGNOSIS — E11319 Type 2 diabetes mellitus with unspecified diabetic retinopathy without macular edema: Secondary | ICD-10-CM | POA: Diagnosis not present

## 2022-04-12 DIAGNOSIS — G20 Parkinson's disease: Secondary | ICD-10-CM | POA: Diagnosis not present

## 2022-04-12 DIAGNOSIS — E1169 Type 2 diabetes mellitus with other specified complication: Secondary | ICD-10-CM | POA: Diagnosis not present

## 2022-04-17 DIAGNOSIS — G20 Parkinson's disease: Secondary | ICD-10-CM | POA: Diagnosis not present

## 2022-04-17 DIAGNOSIS — E1159 Type 2 diabetes mellitus with other circulatory complications: Secondary | ICD-10-CM | POA: Diagnosis not present

## 2022-04-17 DIAGNOSIS — E1169 Type 2 diabetes mellitus with other specified complication: Secondary | ICD-10-CM | POA: Diagnosis not present

## 2022-04-17 DIAGNOSIS — G510 Bell's palsy: Secondary | ICD-10-CM | POA: Diagnosis not present

## 2022-04-17 DIAGNOSIS — E11319 Type 2 diabetes mellitus with unspecified diabetic retinopathy without macular edema: Secondary | ICD-10-CM | POA: Diagnosis not present

## 2022-04-17 DIAGNOSIS — I152 Hypertension secondary to endocrine disorders: Secondary | ICD-10-CM | POA: Diagnosis not present

## 2022-04-19 ENCOUNTER — Telehealth: Payer: Self-pay

## 2022-04-19 DIAGNOSIS — I152 Hypertension secondary to endocrine disorders: Secondary | ICD-10-CM | POA: Diagnosis not present

## 2022-04-19 DIAGNOSIS — E11319 Type 2 diabetes mellitus with unspecified diabetic retinopathy without macular edema: Secondary | ICD-10-CM | POA: Diagnosis not present

## 2022-04-19 DIAGNOSIS — E1159 Type 2 diabetes mellitus with other circulatory complications: Secondary | ICD-10-CM | POA: Diagnosis not present

## 2022-04-19 DIAGNOSIS — E1169 Type 2 diabetes mellitus with other specified complication: Secondary | ICD-10-CM | POA: Diagnosis not present

## 2022-04-19 DIAGNOSIS — G510 Bell's palsy: Secondary | ICD-10-CM | POA: Diagnosis not present

## 2022-04-19 DIAGNOSIS — G20 Parkinson's disease: Secondary | ICD-10-CM | POA: Diagnosis not present

## 2022-04-19 NOTE — Chronic Care Management (AMB) (Signed)
  Chronic Care Management   Note  04/19/2022 Name: ZACHARIAS RIDLING MRN: [id number] DOB: Nov 08, 1941  [name] is a 81 y.o. year old male who is a primary care patient of [name], MD. AMOL DOMANSKI is currently enrolled in care management services. An additional referral for RNCM  was placed.   Follow up plan: Telephone appointment with care management team member scheduled for:04/30/2022  Noreene Larsson, [address], [place], [place] [address] Direct Dial: [phone] [email or web address]

## 2022-04-19 NOTE — Chronic Care Management (AMB) (Signed)
  Chronic Care Management   Note  04/19/2022 Name: [name] MRN: [id number] DOB: 1941/01/12  [name] is a 81 y.o. year old male who is a primary care patient of [name], MD. MATH BRAZIE is currently enrolled in care management services. An additional referral for RNCM  was placed.   Follow up plan: Unsuccessful telephone outreach attempt made. A HIPAA compliant phone message was left for the patient providing contact information and requesting a return call.  The care management team will reach out to the patient again over the next 7 days.  If patient returns call to provider office, please advise to call [facility]  at [place] [address], [place], [place], [place] [address] Direct Dial: [phone] Migdalia [email or web address]: [email or web address]

## 2022-04-22 ENCOUNTER — Ambulatory Visit (INDEPENDENT_AMBULATORY_CARE_PROVIDER_SITE_OTHER): Payer: Medicare Other | Admitting: Family Medicine

## 2022-04-22 ENCOUNTER — Encounter: Payer: Self-pay | Admitting: Family Medicine

## 2022-04-22 VITALS — BP 128/68 | HR 83 | Temp 98.4°F | Ht 72.0 in | Wt 259.6 lb

## 2022-04-22 DIAGNOSIS — E785 Hyperlipidemia, unspecified: Secondary | ICD-10-CM

## 2022-04-22 DIAGNOSIS — Z794 Long term (current) use of insulin: Secondary | ICD-10-CM

## 2022-04-22 DIAGNOSIS — E1169 Type 2 diabetes mellitus with other specified complication: Secondary | ICD-10-CM | POA: Diagnosis not present

## 2022-04-22 DIAGNOSIS — E11319 Type 2 diabetes mellitus with unspecified diabetic retinopathy without macular edema: Secondary | ICD-10-CM

## 2022-04-22 DIAGNOSIS — E039 Hypothyroidism, unspecified: Secondary | ICD-10-CM | POA: Diagnosis not present

## 2022-04-22 LAB — CBC WITH DIFFERENTIAL/PLATELET
Basophils Absolute: 0 10*3/uL (ref 0.0–0.1)
Basophils Relative: 0.2 % (ref 0.0–3.0)
Eosinophils Absolute: 0.2 10*3/uL (ref 0.0–0.7)
Eosinophils Relative: 0.6 % (ref 0.0–5.0)
HCT: 42.9 % (ref 39.0–52.0)
Hemoglobin: 14.5 g/dL (ref 13.0–17.0)
Lymphocytes Relative: 76.5 % — ABNORMAL HIGH (ref 12.0–46.0)
Lymphs Abs: 22.4 10*3/uL — ABNORMAL HIGH (ref 0.7–4.0)
MCHC: 33.7 g/dL (ref 30.0–36.0)
MCV: 84.6 fl (ref 78.0–100.0)
Monocytes Absolute: 0.8 10*3/uL (ref 0.1–1.0)
Monocytes Relative: 2.7 % — ABNORMAL LOW (ref 3.0–12.0)
Neutro Abs: 5.9 10*3/uL (ref 1.4–7.7)
Neutrophils Relative %: 20 % — ABNORMAL LOW (ref 43.0–77.0)
Platelets: 172 10*3/uL (ref 150.0–400.0)
RBC: 5.07 Mil/uL (ref 4.22–5.81)
RDW: 15.4 % (ref 11.5–15.5)
WBC: 29.3 10*3/uL (ref 4.0–10.5)

## 2022-04-22 LAB — TSH: TSH: 1.27 u[IU]/mL (ref 0.35–5.50)

## 2022-04-22 LAB — LIPID PANEL
Cholesterol: 118 mg/dL (ref 0–200)
HDL: 39.8 mg/dL (ref >39.00)
NonHDL: 78.01
Total CHOL/HDL Ratio: 3
Triglycerides: 372 mg/dL — ABNORMAL HIGH (ref 0.0–149.0)
VLDL: 74.4 mg/dL — ABNORMAL HIGH (ref 0.0–40.0)

## 2022-04-22 LAB — COMPREHENSIVE METABOLIC PANEL
ALT: 27 U/L (ref 0–53)
AST: 41 U/L — ABNORMAL HIGH (ref 0–37)
Albumin: 4.4 g/dL (ref 3.5–5.2)
Alkaline Phosphatase: 56 U/L (ref 39–117)
BUN: 12 mg/dL (ref 6–23)
CO2: 30 mEq/L (ref 19–32)
Calcium: 9.2 mg/dL (ref 8.4–10.5)
Chloride: 102 mEq/L (ref 96–112)
Creatinine, Ser: 1.2 mg/dL (ref 0.40–1.50)
GFR: 57.85 mL/min — ABNORMAL LOW (ref >60.00)
Glucose, Bld: 162 mg/dL — ABNORMAL HIGH (ref 70–99)
Potassium: 3.5 mEq/L (ref 3.5–5.1)
Sodium: 140 mEq/L (ref 135–145)
Total Bilirubin: 1.4 mg/dL — ABNORMAL HIGH (ref 0.2–1.2)
Total Protein: 6.4 g/dL (ref 6.0–8.3)

## 2022-04-22 LAB — LDL CHOLESTEROL, DIRECT: Direct LDL: 55 mg/dL

## 2022-04-22 LAB — HEMOGLOBIN A1C: Hgb A1c MFr Bld: 8.2 % — ABNORMAL HIGH (ref 4.6–6.5)

## 2022-04-22 NOTE — Progress Notes (Signed)
Phone [phone] In person visit   Subjective:   [name] is a 81 y.o. 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[place]) 09/81/1914    Priority: High   Diabetic retinopathy ([place]) 09/24/2007    Priority: High   Tremor 08/11/2015    Priority: Medium    Hypothyroidism     Priority: Medium    Lapband APL + HH repair 08/12/2013    Priority: Medium    Insomnia 09/21/2009    Priority: Medium    Hyperlipidemia associated with type 2 diabetes mellitus ([place]) 09/24/2007    Priority: Medium    OSA on CPAP 09/24/2007    Priority: Medium    Hypertension associated with diabetes ([place]) 09/24/2007    Priority: Medium    Bell's palsy 05/08/2020    Priority: Low   Personal history of skin cancer 04/02/2017    Priority: Low   GERD (gastroesophageal reflux disease) 12/08/2014    Priority: Low   History of colonic polyps 08/22/2008    Priority: Low   Parkinson's disease ([place]) 12/20/2021   Gynecomastia 12/28/2020   Vitamin D deficiency 11/01/2020   Senile purpura ([place]) 05/02/2020   Sacroiliac joint dysfunction 06/29/2018   Ear pressure, left 04/14/2017   Temporomandibular joint (TMJ) pain 04/14/2017   Pain syndrome, chronic 02/01/2016   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 myelopathy or radiculopathy 02/01/2016    Medications- reviewed and updated Current Outpatient Medications  Medication Sig Dispense Refill   amLODipine (NORVASC) 10 MG tablet Take 10 mg by mouth daily.     azelastine (ASTELIN) 0.1 % nasal spray Place  1 spray into both nostrils 2 (two) times daily. Use in each nostril as directed 30 mL 12   carbidopa-levodopa (SINEMET IR) 25-100 MG tablet Take 1 tablet by mouth 3 (three) times daily. 6am/10am/2pm 90 tablet 0   carbidopa-levodopa (SINEMET IR) 25-100 MG tablet Take 1 tablet by mouth 3 (three) times daily. 270 tablet 1   Cholecalciferol 50 MCG (2000 UT) TABS Take by mouth.     fenofibrate (TRICOR) 145 MG tablet Take 145 mg by mouth daily.     fluticasone (FLONASE) 50 MCG/ACT nasal spray Place into both nostrils.     gabapentin (NEURONTIN) 300 MG capsule Take 3 caps in PM and 3 caps at bedtime and continue.     glucose 4 GM chewable tablet Chew by mouth.     glucose blood test strip Use to test 4 times daily. E11.9 100 each 12   glucose-Vitamin C 4-0.006 GM CHEW chewable tablet Chew by mouth.     hydrochlorothiazide (HYDRODIURIL) 25 MG tablet Take 25 mg by mouth daily.     hydrocortisone 2.5 % cream SMARTSIG:1 Topical Daily     hydrocortisone 2.5 % cream SMARTSIG:1 Topical Daily     hydrOXYzine (ATARAX) 10 MG tablet Take 10 mg by mouth 3 (three) times daily.     insulin aspart (NOVOLOG) 100 UNIT/ML injection Inject 30 Units into the skin 3 (three) times daily with meals. When sugar is  elevated     insulin glargine (LANTUS) 100 UNIT/ML injection Inject 50 Units into the skin at bedtime.     lansoprazole (PREVACID) 30 MG capsule Take 1 capsule (30 mg total) by mouth daily at 12 noon. 30 capsule 5   levothyroxine (SYNTHROID, LEVOTHROID) 50 MCG tablet Take 50 mcg by mouth daily before breakfast.     losartan (COZAAR) 100 MG tablet Take 100 mg by mouth daily.     mirtazapine (REMERON) 15 MG tablet Take 15 mg by mouth at bedtime.     ondansetron (ZOFRAN ODT) 4 MG disintegrating tablet Take 1 tablet (4 mg total) by mouth every 8 (eight) hours as needed for nausea or vomiting. 20 tablet 0   OVER THE COUNTER MEDICATION PreserVision for eyes     potassium chloride SA (K-DUR,KLOR-CON) 20 MEQ tablet Take 20  mEq by mouth daily.     simvastatin (ZOCOR) 80 MG tablet Take half tablet (40 mg) by mouth once daily     sucralfate (CARAFATE) 1 G tablet Take 1 g by mouth 4 (four) times daily -  with meals and at bedtime.     traMADol (ULTRAM) 50 MG tablet Take 1 tablet (50 mg total) by mouth every 6 (six) hours as needed. 10 tablet 0   trimethoprim-polymyxin b (POLYTRIM) ophthalmic solution Apply to eye.     zolpidem (AMBIEN) 5 MG tablet Take 5 mg by mouth at bedtime as needed for sleep.     No current facility-administered medications for this visit.     Objective:  BP 128/68   Pulse 83   Temp 98.4 F (36.9 C)   Ht 6' (1.829 m)   Wt 259 lb 9.6 oz (117.8 kg)   SpO2 96%   BMI 35.21 kg/m  Gen: NAD, resting comfortably CV: RRR no murmurs rubs or gallops Lungs: CTAB no crackles, wheeze, rhonchi Abdomen: soft/nontender/nondistended/normal bowel sounds.  Ext: trace edema Skin: warm, dry    Assessment and Plan   #Parkinson's follows with Dr. 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 Denna Haggard due to increased risk of melanoma -having some continence issues  He needs assistance with bathing, dressing, meal prep, trimming toenails, lotioning and care for seborrheic dermatitis A/P: ongoing issues with tremor- he is going to continue working with PT    -discussed importance of mobility with parkinsons - I would certainly be in support of extra help in the home for his wife - happy to write letter to support from [place]  #Hyperlipidemia S: Controlled on simvastatin 40 mg-half of 80 mg tablet   - [facility] also added gemfibrozil with triglycerides 379-currently on fenofibrate Lab Results  Component Value Date   CHOL 105 03/29/2021   HDL 42 03/29/2021   LDLCALC 11  03/29/2021   LDLDIRECT 47.0 11/05/2019   TRIG 258 (A) 03/29/2021   CHOLHDL 2 07/16/2017   A/P: Well-controlled last check though triglycerides still somewhat high-update lipid panel with labs today He is on fenofibrate but not clear on dose- wife will check   #Hypertension S: Controlled on hydrochlorothiazide 25 mg, losartan 100 mg, amlodipine 10 mg (some edema in past) -propranolol for tremor lowered HR today so he stopped this BP Readings from Last 3 Encounters:  04/22/22 128/68  04/02/22 135/75  04/02/22 140/70   A/P:  Controlled. Continue current  medications.    #Diabetes type 2 with ophthalmic complications- lifestyle meter S: Compliant with Lantus 65 units--> 60 .  Also uses short acting insulin 35-45 units before meals.  A1c goal 7.5 or less.   Average blood sugar on freestyle is 174 over 90 days- hestates when he compares to lifestyle meter freestyle is 40 lower Lab Results  Component Value Date   HGBA1C 6.9 (H) 12/20/2021   HGBA1C 6.2 09/05/2021   HGBA1C 6.5 03/29/2021   A/P: I suspect a1c may be somewhat higher- we will try to target at least under 7.5 or 8- really want to avoid lows -reports retinopathy overall stable with Dr. Zigmund Daniel- gets injections   #Hypothyroidism S: Compliant with levothyroxine 50 mcg Lab Results  Component Value Date   TSH 1.63 12/20/2021  A/P:  hopefully stable- update TSH today. Continue current meds for now   #B12 deficiency S: history of gastric bypass. have recommended b12 1000 mcg per day- he prefers injections  Lab Results  Component Value Date   VITAMINB12 1,138 (H) 09/05/2021    A/P: Excellent control last check-continue injections monthly   %  Patient with known CLL-was following with Dr. Alen Blew.  Likely stable-update CBC with labs today. He has decided to discontinue follow up with oncology- would not be interested in therapy   #Insomnia-prior trials of trazodone  (didn't work) and Ambien (not ideal with memory loss) most  recently on mirtazapine to the [place] listed but he states he is on the zolpidem . Also on buspirone through [place]  Recommended follow up: Return in about 4 months (around 08/23/2022) for followup or sooner if needed.Schedule b4 you leave. Future Appointments  Date Time Provider [place]  04/30/2022  9:45 AM [facility] NURSE [facility] PEC  04/30/2022  2:00 PM [facility] HPC-CCM CARE MGR [facility] PEC  05/03/2022  7:45 AM Hayden Pedro, MD T[facility]-TRE None  05/21/2022  9:15 AM [facility] NURSE [facility] PEC  07/30/2022  1:00 PM Hazle Coca, PhD [facility] None  07/30/2022  2:00 PM L[facility]- NEUROPSYCH TECH [facility] None  08/07/2022  2:30 PM [facility] LAB [facility] None  08/07/2022  3:00 PM Wyatt Portela, MD [facility] None  09/16/2022  9:15 AM [name], MD [facility] CD[facility]SO  10/08/2022  2:30 PM Tat, Eustace Quail, DO [facility] None  11/15/2022 11:45 AM [facility] HEALTH COACH [facility] PEC   Lab/Order associations:   ICD-10-CM   1. Type 2 diabetes mellitus with retinopathy of both eyes, with long-term current use of insulin, macular edema presence unspecified, unspecified retinopathy severity (HCC)  E11.319 CBC with Differential/Platelet   Z79.4 Comprehensive metabolic panel    Lipid panel    Hemoglobin A1c    2. Hypothyroidism, unspecified type  E03.9 TSH    3. Hyperlipidemia associated with type 2 diabetes mellitus (HCC)  E11.69 CBC with Differential/Platelet   E78.5 Comprehensive metabolic panel    Lipid panel     No orders of the defined types were placed in this encounter.  Return precautions advised.  [name], MD

## 2022-04-22 NOTE — Patient Instructions (Addendum)
Please stop by lab before you go If you have mychart- we will send your results within 3 business days of [place] receiving them.  If you do not have mychart- we will call you about results within 5 business days of [place] receiving them.  *please also note that you will see labs on mychart as soon as they post. I will later go in and write notes on them- will say "notes from Dr. Yong Channel"   Recommended follow up: Return in about 4 months (around 08/23/2022) for followup or sooner if needed.Schedule b4 you leave.

## 2022-04-24 DIAGNOSIS — I152 Hypertension secondary to endocrine disorders: Secondary | ICD-10-CM | POA: Diagnosis not present

## 2022-04-24 DIAGNOSIS — G510 Bell's palsy: Secondary | ICD-10-CM | POA: Diagnosis not present

## 2022-04-24 DIAGNOSIS — E1159 Type 2 diabetes mellitus with other circulatory complications: Secondary | ICD-10-CM | POA: Diagnosis not present

## 2022-04-24 DIAGNOSIS — E11319 Type 2 diabetes mellitus with unspecified diabetic retinopathy without macular edema: Secondary | ICD-10-CM | POA: Diagnosis not present

## 2022-04-24 DIAGNOSIS — E1169 Type 2 diabetes mellitus with other specified complication: Secondary | ICD-10-CM | POA: Diagnosis not present

## 2022-04-24 DIAGNOSIS — G20 Parkinson's disease: Secondary | ICD-10-CM | POA: Diagnosis not present

## 2022-04-26 ENCOUNTER — Encounter: Payer: Self-pay | Admitting: Neurology

## 2022-04-26 DIAGNOSIS — E1159 Type 2 diabetes mellitus with other circulatory complications: Secondary | ICD-10-CM | POA: Diagnosis not present

## 2022-04-26 DIAGNOSIS — I152 Hypertension secondary to endocrine disorders: Secondary | ICD-10-CM | POA: Diagnosis not present

## 2022-04-26 DIAGNOSIS — G510 Bell's palsy: Secondary | ICD-10-CM | POA: Diagnosis not present

## 2022-04-26 DIAGNOSIS — E11319 Type 2 diabetes mellitus with unspecified diabetic retinopathy without macular edema: Secondary | ICD-10-CM | POA: Diagnosis not present

## 2022-04-26 DIAGNOSIS — E1169 Type 2 diabetes mellitus with other specified complication: Secondary | ICD-10-CM | POA: Diagnosis not present

## 2022-04-26 DIAGNOSIS — G20 Parkinson's disease: Secondary | ICD-10-CM | POA: Diagnosis not present

## 2022-04-30 ENCOUNTER — Ambulatory Visit: Payer: Medicare Other | Admitting: *Deleted

## 2022-04-30 ENCOUNTER — Ambulatory Visit (INDEPENDENT_AMBULATORY_CARE_PROVIDER_SITE_OTHER): Payer: Medicare Other

## 2022-04-30 DIAGNOSIS — E11319 Type 2 diabetes mellitus with unspecified diabetic retinopathy without macular edema: Secondary | ICD-10-CM

## 2022-04-30 DIAGNOSIS — E538 Deficiency of other specified B group vitamins: Secondary | ICD-10-CM

## 2022-04-30 DIAGNOSIS — E1159 Type 2 diabetes mellitus with other circulatory complications: Secondary | ICD-10-CM

## 2022-04-30 MED ORDER — CYANOCOBALAMIN 1000 MCG/ML IJ SOLN
1000.0000 ug | Freq: Once | INTRAMUSCULAR | Status: AC
  Administered 2022-04-30: 1000 ug via INTRAMUSCULAR

## 2022-04-30 NOTE — Progress Notes (Signed)
Pt tolerated b12 injection well.

## 2022-04-30 NOTE — Chronic Care Management (AMB) (Signed)
Care Management    RN Visit Note  04/30/2022 Name: [name] MRN: [id number] DOB: [birth year]  Subjective: [name] is a 81 y.o. year old male who is a primary care patient of [name], MD. The care management team was consulted for assistance with disease management and care coordination needs.    Engaged with patient by telephone for initial visit in response to provider referral for case management and/or care coordination services.   Consent to Services:   [name] was given information about Care Management services today including:  Care Management services includes personalized support from designated clinical staff supervised by his physician, including individualized plan of care and coordination with other care providers 24/7 contact phone numbers for assistance for urgent and routine care needs. The patient may stop case management services at any time by phone call to the office staff.  Patient agreed to services and consent obtained.   Assessment: Review of patient past medical history, allergies, medications, health status, including review of consultants reports, laboratory and other test data, was performed as part of comprehensive evaluation and provision of chronic care management services.   SDOH (Social Determinants of Health) assessments and interventions performed:    Care Plan  Allergies  Allergen Reactions   Morphine Sulfate Itching and Rash    Outpatient Encounter Medications as of 04/30/2022  Medication Sig Note   amLODipine (NORVASC) 10 MG tablet Take 10 mg by mouth daily.    azelastine (ASTELIN) 0.1 % nasal spray Place 1 spray into both nostrils 2 (two) times daily. Use in each nostril as directed    busPIRone (BUSPAR) 10 MG tablet Take by mouth 3 (three) times daily.    carbidopa-levodopa (SINEMET IR) 25-100 MG tablet Take 1 tablet by mouth 3 (three) times daily. 6am/10am/2pm    Cholecalciferol 50 MCG (2000 UT) TABS Take by  mouth.    fenofibrate (TRICOR) 145 MG tablet Take 145 mg by mouth daily.    fluticasone (FLONASE) 50 MCG/ACT nasal spray Place into both nostrils.    gabapentin (NEURONTIN) 300 MG capsule Take 3 caps in PM and 3 caps at bedtime and continue. 04/30/2022: REPORTS TAKING 3 CAPSULES TID   glucose-Vitamin C 4-0.006 GM CHEW chewable tablet Chew by mouth.    hydrochlorothiazide (HYDRODIURIL) 25 MG tablet Take 25 mg by mouth daily.    hydrocortisone 2.5 % cream SMARTSIG:1 Topical Daily    hydrOXYzine (ATARAX) 10 MG tablet Take 10 mg by mouth 3 (three) times daily.    insulin aspart (NOVOLOG) 100 UNIT/ML injection Inject 30 Units into the skin 3 (three) times daily with meals. When sugar is elevated    insulin glargine (LANTUS) 100 UNIT/ML injection Inject 50 Units into the skin at bedtime.    lansoprazole (PREVACID) 30 MG capsule Take 1 capsule (30 mg total) by mouth daily at 12 noon.    levothyroxine (SYNTHROID, LEVOTHROID) 50 MCG tablet Take 50 mcg by mouth daily before breakfast.    losartan (COZAAR) 100 MG tablet Take 100 mg by mouth daily.    mirtazapine (REMERON) 15 MG tablet Take 15 mg by mouth at bedtime.    OVER THE COUNTER MEDICATION PreserVision for eyes    potassium chloride SA (K-DUR,KLOR-CON) 20 MEQ tablet Take 20 mEq by mouth daily.    simvastatin (ZOCOR) 80 MG tablet Take half tablet (40 mg) by mouth once daily    sucralfate (CARAFATE) 1 G tablet Take 1 g by mouth 4 (four) times daily -  with meals and at bedtime.    traMADol (ULTRAM) 50 MG tablet Take 1 tablet (50 mg total) by mouth every 6 (six) hours as needed.    trimethoprim-polymyxin b (POLYTRIM) ophthalmic solution Apply to eye.    zolpidem (AMBIEN) 5 MG tablet Take 5 mg by mouth at bedtime as needed for sleep.    carbidopa-levodopa (SINEMET IR) 25-100 MG tablet Take 1 tablet by mouth 3 (three) times daily.    glucose 4 GM chewable tablet Chew by mouth.    glucose blood test strip Use to test 4 times daily. E11.9     hydrocortisone 2.5 % cream SMARTSIG:1 Topical Daily    ondansetron (ZOFRAN ODT) 4 MG disintegrating tablet Take 1 tablet (4 mg total) by mouth every 8 (eight) hours as needed for nausea or vomiting.    No facility-administered encounter medications on file as of 04/30/2022.    Patient Active Problem List   Diagnosis Date Noted   Parkinson's disease ([employer]) 12/20/2021   Gynecomastia 12/28/2020   Vitamin D deficiency 11/01/2020   Bell's palsy 05/08/2020   Senile purpura ([place]) 05/02/2020   Sacroiliac joint dysfunction 06/29/2018   Ear pressure, left 04/14/2017   Temporomandibular joint (TMJ) pain 04/14/2017   Personal history of skin cancer 04/02/2017   Pain syndrome, chronic 02/01/2016   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 myelopathy or radiculopathy 02/01/2016   Chronic bilateral low back pain without sciatica 12/12/2015   Tremor 08/11/2015   GERD (gastroesophageal reflux disease) 12/08/2014   Hypothyroidism    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   History of prostate cancer 01/24/2014   Lapband APL + HH repair 08/12/2013   Insomnia 09/21/2009   History of colonic polyps 08/22/2008   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[place]) 35/57/3220   Diabetic retinopathy ([place]) 09/24/2007   Hyperlipidemia associated with type 2 diabetes mellitus ([place]) 09/24/2007   OSA on CPAP 09/24/2007   Hypertension associated with diabetes ([place]) 09/24/2007    Conditions to be addressed/monitored: HTN and DMII  Care Plan : [place] (Adult)  Updates made by Leona Singleton, RN since 04/30/2022 12:00 AM     Problem: KNOWLEDGE DEFICIT RELATED TO SELF CARE MANAGEMENT OF CHRONIC MEDICAL CONDITIONS   Priority: Medium     Long-Range Goal: PATIENT AND WIFE WILL WORK WITH CCM TEAM TO GAIN KNOWLEDGE TO BETTER MANAGE CHRONIC MEDICAL CONDITIONS   Start Date: 04/30/2022  Expected End Date: 05/01/2023  Priority: Medium  Note:   Current Barriers:  Knowledge Deficits  related to plan of care for management of HTN and DMII  5/30--spoke with patient and wife, they both request 3 month or 6 month follow up not more frequent.  We discussed increasing A1C and discussed ways to help reduce.  Fasting blood sugar this morning was 169.  Discussed blood sugar goal and ways to help reduce.  Encouraged to take blood pressure at home RNCM Clinical Goal(s):  Patient will demonstrate Improved adherence to prescribed treatment plan for HTN and DMII as evidenced by DECREASING A1C BY 0.3 POINTS AND MONITORING BLOOD PRESSURES AT HOME  through collaboration with RN Care manager, provider, and care team.   Interventions: 1:1 collaboration with primary care provider regarding development and update of comprehensive plan of care as evidenced by provider attestation and co-signature Inter-disciplinary care team collaboration (see longitudinal plan of care) Evaluation of current treatment plan related to  self management and patient's adherence to plan as established by provider   Diabetes Interventions:  (  Status:  New goal.) Long Term Goal Assessed patient's understanding of A1c goal: <7% Provided education to patient about basic DM disease process Reviewed medications with patient and discussed importance of medication adherence Counseled on importance of regular laboratory monitoring as prescribed Discussed plans with patient for ongoing care management follow up and provided patient with direct contact information for care management team Advised patient, providing education and rationale, to check cbg 4 TIMES A DAY and record, calling PCP for findings outside established parameters Screening for signs and symptoms of depression related to chronic disease state  Assessed social determinant of health barriers DISCUSSED CARB COUNTING AND HEALTHIER MEAL AND DRINK OPTIONS Carb counting education sent to patient in mychart  Lab Results  Component Value Date   HGBA1C 8.2 (H)  04/22/2022   Hypertension Interventions:  (Status:  New goal.) Long Term Goal Last practice recorded BP readings:  BP Readings from Last 3 Encounters:  04/22/22 128/68  04/02/22 135/75  04/02/22 140/70  Most recent eGFR/CrCl:  Lab Results  Component Value Date   EGFR 60 (L) 10/30/2017    No components found for: CRCL  Evaluation of current treatment plan related to hypertension self management and patient's adherence to plan as established by provider Reviewed medications with patient and discussed importance of compliance Advised patient, providing education and rationale, to monitor blood pressure daily and record, calling PCP for findings outside established parameters Discussed complications of poorly controlled blood pressure such as heart disease, stroke, circulatory complications, vision complications, kidney impairment, sexual dysfunction Encouraged wife to contact secondary insurance to verify transportation options Confirmed patient receives meds from [place] and what does not if affordable  Patient Goals/Self-Care Activities: keep appointment with eye doctor check blood sugar at prescribed times: before meals and at bedtime and when you have symptoms of low or high blood sugar check feet daily for cuts, sores or redness manage portion size switch to low-fat or skim milk check blood pressure weekly keep a blood pressure log take blood pressure log to all doctor appointments call doctor for signs and symptoms of high blood pressure keep all doctor appointments report new symptoms to your doctor  Follow Up Plan:  The care management team will reach out to the patient again over the next 90 days.      Plan: The care management team will reach out to the patient again over the next 90 days.  Hubert Azure RN, MSN RN Care Management Coordinator  [place] [phone] [email or web address]

## 2022-04-30 NOTE — Patient Instructions (Addendum)
Visit Information   Thank you for taking time to visit with me today. Please don't hesitate to contact me if I can be of assistance to you before our next scheduled telephone appointment.  Following are the goals we discussed today:  keep appointment with eye doctor check blood sugar at prescribed times: before meals and at bedtime and when you have symptoms of low or high blood sugar check feet daily for cuts, sores or redness manage portion size switch to low-fat or skim milk check blood pressure weekly keep a blood pressure log take blood pressure log to all doctor appointments call doctor for signs and symptoms of high blood pressure keep all doctor appointments report new symptoms to your doctor  Our next appointment is by telephone on 8/24 at 1000  Please call the care guide team at [phone] if you need to cancel or reschedule your appointment.   If you are experiencing a Mental Health or [place] or need someone to talk to, please call the Suicide and Crisis Lifeline: 988 call the [place] National Suicide Prevention Lifeline: 919-632-5327 or TTY: 712-395-8245 TTY 386-244-5573) to talk to a trained counselor call 1-800-273-TALK (toll free, 24 hour hotline) call 911   Following is a copy of your full care plan:  Care Plan : [facility] (Adult)  Updates made by Leona Singleton, RN since 04/30/2022 12:00 AM     Problem: KNOWLEDGE DEFICIT RELATED TO SELF CARE MANAGEMENT OF CHRONIC MEDICAL CONDITIONS   Priority: Medium     Long-Range Goal: PATIENT AND WIFE WILL WORK WITH CCM TEAM TO GAIN KNOWLEDGE TO [place]   Start Date: 04/30/2022  Expected End Date: 05/01/2023  Priority: Medium  Note:   Current Barriers:  Knowledge Deficits related to plan of care for management of HTN and DMII  5/30--spoke with patient and wife, they both request 3 month or 6 month follow up not more frequent.  We discussed increasing  A1C and discussed ways to help reduce.  Fasting blood sugar this morning was 169.  Discussed blood sugar goal and ways to help reduce.  Encouraged to take blood pressure at home RNCM Clinical Goal(s):  Patient will demonstrate Improved adherence to prescribed treatment plan for HTN and DMII as evidenced by DECREASING A1C BY 0.3 POINTS AND MONITORING BLOOD PRESSURES AT HOME  through collaboration with RN Care manager, provider, and care team.   Interventions: 1:1 collaboration with primary care provider regarding development and update of comprehensive plan of care as evidenced by provider attestation and co-signature Inter-disciplinary care team collaboration (see longitudinal plan of care) Evaluation of current treatment plan related to  self management and patient's adherence to plan as established by provider   Diabetes Interventions:  (Status:  New goal.) Long Term Goal Assessed patient's understanding of A1c goal: <7% Provided education to patient about basic DM disease process Reviewed medications with patient and discussed importance of medication adherence Counseled on importance of regular laboratory monitoring as prescribed Discussed plans with patient for ongoing care management follow up and provided patient with direct contact information for care management team Advised patient, providing education and rationale, to check cbg 4 TIMES A DAY and record, calling PCP for findings outside established parameters Screening for signs and symptoms of depression related to chronic disease state  Assessed social determinant of health barriers DISCUSSED CARB COUNTING AND HEALTHIER MEAL AND DRINK OPTIONS Carb counting education sent to patient in [place]  Lab Results  Component Value Date   HGBA1C 8.2 (H) 04/22/2022   Hypertension Interventions:  (Status:  New goal.) Long Term Goal Last practice recorded BP readings:  BP Readings from Last 3 Encounters:  04/22/22 128/68  04/02/22  135/75  04/02/22 140/70  Most recent eGFR/CrCl:  Lab Results  Component Value Date   EGFR 60 (L) 10/30/2017    No components found for: CRCL  Evaluation of current treatment plan related to hypertension self management and patient's adherence to plan as established by provider Reviewed medications with patient and discussed importance of compliance Advised patient, providing education and rationale, to monitor blood pressure daily and record, calling PCP for findings outside established parameters Discussed complications of poorly controlled blood pressure such as heart disease, stroke, circulatory complications, vision complications, kidney impairment, sexual dysfunction Encouraged wife to contact secondary insurance to verify transportation options Confirmed patient receives meds from [place] and what does not if affordable  Patient Goals/Self-Care Activities: keep appointment with eye doctor check blood sugar at prescribed times: before meals and at bedtime and when you have symptoms of low or high blood sugar check feet daily for cuts, sores or redness manage portion size switch to low-fat or skim milk check blood pressure weekly keep a blood pressure log take blood pressure log to all doctor appointments call doctor for signs and symptoms of high blood pressure keep all doctor appointments report new symptoms to your doctor  Follow Up Plan:  The care management team will reach out to the patient again over the next 90 days.      Consent to CCM Services: [name] was given information about Chronic Care Management services including:  CCM service includes personalized support from designated clinical staff supervised by his physician, including individualized plan of care and coordination with other care providers 24/7 contact phone numbers for assistance for urgent and routine care needs. Service will only be billed when office clinical staff spend 20 minutes or more in a month  to coordinate care. Only one practitioner may furnish and bill the service in a calendar month. The patient may stop CCM services at any time (effective at the end of the month) by phone call to the office staff. The patient will be responsible for cost sharing (co-pay) of up to 20% of the service fee (after annual deductible is met).  Patient agreed to services and verbal consent obtained.   Patient verbalizes understanding of instructions and care plan provided today and agrees to view in [place]. Active MyChart status and patient understanding of how to access instructions and care plan via MyChart confirmed with patient.     The care management team will reach out to the patient again over the next 90 days.  PER PATIENT WIFE REQUEST

## 2022-05-02 DIAGNOSIS — I152 Hypertension secondary to endocrine disorders: Secondary | ICD-10-CM | POA: Diagnosis not present

## 2022-05-02 DIAGNOSIS — E1169 Type 2 diabetes mellitus with other specified complication: Secondary | ICD-10-CM | POA: Diagnosis not present

## 2022-05-02 DIAGNOSIS — E11319 Type 2 diabetes mellitus with unspecified diabetic retinopathy without macular edema: Secondary | ICD-10-CM | POA: Diagnosis not present

## 2022-05-02 DIAGNOSIS — G510 Bell's palsy: Secondary | ICD-10-CM | POA: Diagnosis not present

## 2022-05-02 DIAGNOSIS — E1159 Type 2 diabetes mellitus with other circulatory complications: Secondary | ICD-10-CM | POA: Diagnosis not present

## 2022-05-02 DIAGNOSIS — G20 Parkinson's disease: Secondary | ICD-10-CM | POA: Diagnosis not present

## 2022-05-03 ENCOUNTER — Encounter (INDEPENDENT_AMBULATORY_CARE_PROVIDER_SITE_OTHER): Payer: Medicare Other | Admitting: Ophthalmology

## 2022-05-03 DIAGNOSIS — I10 Essential (primary) hypertension: Secondary | ICD-10-CM | POA: Diagnosis not present

## 2022-05-03 DIAGNOSIS — H35033 Hypertensive retinopathy, bilateral: Secondary | ICD-10-CM

## 2022-05-03 DIAGNOSIS — H43813 Vitreous degeneration, bilateral: Secondary | ICD-10-CM | POA: Diagnosis not present

## 2022-05-03 DIAGNOSIS — H353231 Exudative age-related macular degeneration, bilateral, with active choroidal neovascularization: Secondary | ICD-10-CM | POA: Diagnosis not present

## 2022-05-06 DIAGNOSIS — G510 Bell's palsy: Secondary | ICD-10-CM | POA: Diagnosis not present

## 2022-05-06 DIAGNOSIS — E1169 Type 2 diabetes mellitus with other specified complication: Secondary | ICD-10-CM | POA: Diagnosis not present

## 2022-05-06 DIAGNOSIS — E1159 Type 2 diabetes mellitus with other circulatory complications: Secondary | ICD-10-CM | POA: Diagnosis not present

## 2022-05-06 DIAGNOSIS — E11319 Type 2 diabetes mellitus with unspecified diabetic retinopathy without macular edema: Secondary | ICD-10-CM | POA: Diagnosis not present

## 2022-05-06 DIAGNOSIS — G20 Parkinson's disease: Secondary | ICD-10-CM | POA: Diagnosis not present

## 2022-05-06 DIAGNOSIS — I152 Hypertension secondary to endocrine disorders: Secondary | ICD-10-CM | POA: Diagnosis not present

## 2022-05-07 DIAGNOSIS — Z5181 Encounter for therapeutic drug level monitoring: Secondary | ICD-10-CM | POA: Diagnosis not present

## 2022-05-07 DIAGNOSIS — Z79899 Other long term (current) drug therapy: Secondary | ICD-10-CM | POA: Diagnosis not present

## 2022-05-07 DIAGNOSIS — M25551 Pain in right hip: Secondary | ICD-10-CM | POA: Diagnosis not present

## 2022-05-07 DIAGNOSIS — M47816 Spondylosis without myelopathy or radiculopathy, lumbar region: Secondary | ICD-10-CM | POA: Diagnosis not present

## 2022-05-07 DIAGNOSIS — M533 Sacrococcygeal disorders, not elsewhere classified: Secondary | ICD-10-CM | POA: Diagnosis not present

## 2022-05-07 DIAGNOSIS — G8929 Other chronic pain: Secondary | ICD-10-CM | POA: Diagnosis not present

## 2022-05-10 ENCOUNTER — Telehealth: Payer: Self-pay

## 2022-05-10 DIAGNOSIS — M47816 Spondylosis without myelopathy or radiculopathy, lumbar region: Secondary | ICD-10-CM | POA: Diagnosis not present

## 2022-05-10 DIAGNOSIS — M533 Sacrococcygeal disorders, not elsewhere classified: Secondary | ICD-10-CM | POA: Diagnosis not present

## 2022-05-10 DIAGNOSIS — G894 Chronic pain syndrome: Secondary | ICD-10-CM | POA: Diagnosis not present

## 2022-05-10 DIAGNOSIS — H938X2 Other specified disorders of left ear: Secondary | ICD-10-CM | POA: Diagnosis not present

## 2022-05-10 DIAGNOSIS — K219 Gastro-esophageal reflux disease without esophagitis: Secondary | ICD-10-CM | POA: Diagnosis not present

## 2022-05-10 DIAGNOSIS — N62 Hypertrophy of breast: Secondary | ICD-10-CM | POA: Diagnosis not present

## 2022-05-10 DIAGNOSIS — E039 Hypothyroidism, unspecified: Secondary | ICD-10-CM | POA: Diagnosis not present

## 2022-05-10 DIAGNOSIS — Z794 Long term (current) use of insulin: Secondary | ICD-10-CM | POA: Diagnosis not present

## 2022-05-10 DIAGNOSIS — G20 Parkinson's disease: Secondary | ICD-10-CM | POA: Diagnosis not present

## 2022-05-10 DIAGNOSIS — E559 Vitamin D deficiency, unspecified: Secondary | ICD-10-CM | POA: Diagnosis not present

## 2022-05-10 DIAGNOSIS — Z85828 Personal history of other malignant neoplasm of skin: Secondary | ICD-10-CM | POA: Diagnosis not present

## 2022-05-10 DIAGNOSIS — E1169 Type 2 diabetes mellitus with other specified complication: Secondary | ICD-10-CM | POA: Diagnosis not present

## 2022-05-10 DIAGNOSIS — E1159 Type 2 diabetes mellitus with other circulatory complications: Secondary | ICD-10-CM | POA: Diagnosis not present

## 2022-05-10 DIAGNOSIS — E785 Hyperlipidemia, unspecified: Secondary | ICD-10-CM | POA: Diagnosis not present

## 2022-05-10 DIAGNOSIS — E11319 Type 2 diabetes mellitus with unspecified diabetic retinopathy without macular edema: Secondary | ICD-10-CM | POA: Diagnosis not present

## 2022-05-10 DIAGNOSIS — F419 Anxiety disorder, unspecified: Secondary | ICD-10-CM | POA: Diagnosis not present

## 2022-05-10 DIAGNOSIS — D692 Other nonthrombocytopenic purpura: Secondary | ICD-10-CM | POA: Diagnosis not present

## 2022-05-10 DIAGNOSIS — G4700 Insomnia, unspecified: Secondary | ICD-10-CM | POA: Diagnosis not present

## 2022-05-10 DIAGNOSIS — I152 Hypertension secondary to endocrine disorders: Secondary | ICD-10-CM | POA: Diagnosis not present

## 2022-05-10 DIAGNOSIS — Z856 Personal history of leukemia: Secondary | ICD-10-CM | POA: Diagnosis not present

## 2022-05-10 DIAGNOSIS — G4733 Obstructive sleep apnea (adult) (pediatric): Secondary | ICD-10-CM | POA: Diagnosis not present

## 2022-05-10 DIAGNOSIS — Z8546 Personal history of malignant neoplasm of prostate: Secondary | ICD-10-CM | POA: Diagnosis not present

## 2022-05-10 DIAGNOSIS — G510 Bell's palsy: Secondary | ICD-10-CM | POA: Diagnosis not present

## 2022-05-10 DIAGNOSIS — Z86010 Personal history of colonic polyps: Secondary | ICD-10-CM | POA: Diagnosis not present

## 2022-05-10 NOTE — Telephone Encounter (Signed)
Received paperwork from the [facility] requesting medical records. Patient was not referred by the [facility] but by his PCP Dr. Yong Channel. We would need a medical release in order to send these records to the [place]

## 2022-05-17 ENCOUNTER — Telehealth: Payer: Self-pay | Admitting: Family Medicine

## 2022-05-17 NOTE — Telephone Encounter (Signed)
FYI Physical Therapist with [facility] has called stating that patient has missed appointment for today due to being out of town this week.

## 2022-05-21 ENCOUNTER — Ambulatory Visit (INDEPENDENT_AMBULATORY_CARE_PROVIDER_SITE_OTHER): Payer: Medicare Other

## 2022-05-21 DIAGNOSIS — E538 Deficiency of other specified B group vitamins: Secondary | ICD-10-CM

## 2022-05-21 MED ORDER — CYANOCOBALAMIN 1000 MCG/ML IJ SOLN
1000.0000 ug | Freq: Once | INTRAMUSCULAR | Status: AC
  Administered 2022-05-21: 1000 ug via INTRAMUSCULAR

## 2022-05-21 NOTE — Progress Notes (Signed)
Pt tolerated b12 well. 

## 2022-05-23 DIAGNOSIS — E1159 Type 2 diabetes mellitus with other circulatory complications: Secondary | ICD-10-CM | POA: Diagnosis not present

## 2022-05-23 DIAGNOSIS — G20 Parkinson's disease: Secondary | ICD-10-CM | POA: Diagnosis not present

## 2022-05-23 DIAGNOSIS — E11319 Type 2 diabetes mellitus with unspecified diabetic retinopathy without macular edema: Secondary | ICD-10-CM | POA: Diagnosis not present

## 2022-05-23 DIAGNOSIS — I152 Hypertension secondary to endocrine disorders: Secondary | ICD-10-CM | POA: Diagnosis not present

## 2022-05-23 DIAGNOSIS — G510 Bell's palsy: Secondary | ICD-10-CM | POA: Diagnosis not present

## 2022-05-23 DIAGNOSIS — E1169 Type 2 diabetes mellitus with other specified complication: Secondary | ICD-10-CM | POA: Diagnosis not present

## 2022-05-29 DIAGNOSIS — G20 Parkinson's disease: Secondary | ICD-10-CM | POA: Diagnosis not present

## 2022-05-29 DIAGNOSIS — E11319 Type 2 diabetes mellitus with unspecified diabetic retinopathy without macular edema: Secondary | ICD-10-CM | POA: Diagnosis not present

## 2022-05-29 DIAGNOSIS — E1169 Type 2 diabetes mellitus with other specified complication: Secondary | ICD-10-CM | POA: Diagnosis not present

## 2022-05-29 DIAGNOSIS — I152 Hypertension secondary to endocrine disorders: Secondary | ICD-10-CM | POA: Diagnosis not present

## 2022-05-29 DIAGNOSIS — E1159 Type 2 diabetes mellitus with other circulatory complications: Secondary | ICD-10-CM | POA: Diagnosis not present

## 2022-05-29 DIAGNOSIS — G510 Bell's palsy: Secondary | ICD-10-CM | POA: Diagnosis not present

## 2022-06-03 LAB — LIPID PANEL
Cholesterol: 109 (ref 0–200)
HDL: 38 (ref 35–70)
Triglycerides: 442 — AB (ref 40–160)

## 2022-06-03 LAB — HEMOGLOBIN A1C: Hemoglobin A1C: 8.1

## 2022-06-10 ENCOUNTER — Encounter: Payer: Self-pay | Admitting: Family Medicine

## 2022-06-10 ENCOUNTER — Telehealth: Payer: Medicare Other

## 2022-06-11 ENCOUNTER — Other Ambulatory Visit: Payer: Self-pay

## 2022-06-11 MED ORDER — EMPAGLIFLOZIN 25 MG PO TABS: 25.0000 mg | ORAL_TABLET | Freq: Every day | ORAL | Status: AC

## 2022-06-14 ENCOUNTER — Encounter (INDEPENDENT_AMBULATORY_CARE_PROVIDER_SITE_OTHER): Payer: Medicare Other | Admitting: Ophthalmology

## 2022-06-14 DIAGNOSIS — H43813 Vitreous degeneration, bilateral: Secondary | ICD-10-CM | POA: Diagnosis not present

## 2022-06-14 DIAGNOSIS — I10 Essential (primary) hypertension: Secondary | ICD-10-CM

## 2022-06-14 DIAGNOSIS — H353231 Exudative age-related macular degeneration, bilateral, with active choroidal neovascularization: Secondary | ICD-10-CM | POA: Diagnosis not present

## 2022-06-14 DIAGNOSIS — H35033 Hypertensive retinopathy, bilateral: Secondary | ICD-10-CM | POA: Diagnosis not present

## 2022-06-19 ENCOUNTER — Ambulatory Visit (INDEPENDENT_AMBULATORY_CARE_PROVIDER_SITE_OTHER): Payer: Medicare Other

## 2022-06-19 DIAGNOSIS — E538 Deficiency of other specified B group vitamins: Secondary | ICD-10-CM | POA: Diagnosis not present

## 2022-06-19 MED ORDER — CYANOCOBALAMIN 1000 MCG/ML IJ SOLN
1000.0000 ug | Freq: Once | INTRAMUSCULAR | Status: AC
  Administered 2022-06-19: 1000 ug via INTRAMUSCULAR

## 2022-06-19 NOTE — Progress Notes (Signed)
Pt tolerated b12 injection well.

## 2022-07-03 ENCOUNTER — Ambulatory Visit: Payer: Self-pay | Admitting: *Deleted

## 2022-07-03 DIAGNOSIS — I152 Hypertension secondary to endocrine disorders: Secondary | ICD-10-CM

## 2022-07-03 DIAGNOSIS — E11319 Type 2 diabetes mellitus with unspecified diabetic retinopathy without macular edema: Secondary | ICD-10-CM

## 2022-07-03 NOTE — Patient Instructions (Signed)
Visit Information  Thank you for allowing me to share the care management and care coordination services that are available to you as part of your health plan and services through your primary care provider and medical home. Please reach out to me at [phone] if the care management/care coordination team may be of assistance to you in the future.   Marisue Canion RN, MSN RN Care Management Coordinator  [facility] [phone] [email or web address]  

## 2022-07-03 NOTE — Chronic Care Management (AMB) (Signed)
  Care Management   Follow Up Note   07/03/2022 Name: [name] MRN: [id number] DOB: 06-29-1941   Referred by: [name], MD Reason for referral : Case Closure   Patient canceled last appointment with [facility].  Note noted  (Patient will not do appt and does not want to reschedule).  Wikk close case at this time.  Follow Up Plan: No further follow up required: as patient decines future RNCM outreaches.  Hubert Azure RN, MSN RN Care Management Coordinator  [facility] [phone] [email or web address]

## 2022-07-17 ENCOUNTER — Other Ambulatory Visit: Payer: Self-pay

## 2022-07-17 ENCOUNTER — Encounter: Payer: Self-pay | Admitting: *Deleted

## 2022-07-17 ENCOUNTER — Ambulatory Visit (INDEPENDENT_AMBULATORY_CARE_PROVIDER_SITE_OTHER): Payer: Medicare Other | Admitting: *Deleted

## 2022-07-17 DIAGNOSIS — E538 Deficiency of other specified B group vitamins: Secondary | ICD-10-CM

## 2022-07-17 DIAGNOSIS — Z1283 Encounter for screening for malignant neoplasm of skin: Secondary | ICD-10-CM

## 2022-07-17 MED ORDER — CYANOCOBALAMIN 1000 MCG/ML IJ SOLN
1000.0000 ug | Freq: Once | INTRAMUSCULAR | Status: AC
  Administered 2022-07-17: 1000 ug via INTRAMUSCULAR

## 2022-07-17 NOTE — Progress Notes (Signed)
Per orders of Dr. Yong Channel, injection of Cyanocobalamin given IM by Anselmo Pickler, LPN in right deltoid. Patient tolerated injection well. Patient will make appointment for 1 month

## 2022-07-17 NOTE — Progress Notes (Signed)
I have reviewed and agree with note, evaluation, plan.   Pasco Marchitto, MD  

## 2022-07-22 ENCOUNTER — Encounter: Payer: Self-pay | Admitting: Family Medicine

## 2022-07-23 ENCOUNTER — Encounter (INDEPENDENT_AMBULATORY_CARE_PROVIDER_SITE_OTHER): Payer: Medicare Other | Admitting: Ophthalmology

## 2022-07-23 DIAGNOSIS — H35033 Hypertensive retinopathy, bilateral: Secondary | ICD-10-CM | POA: Diagnosis not present

## 2022-07-23 DIAGNOSIS — I10 Essential (primary) hypertension: Secondary | ICD-10-CM | POA: Diagnosis not present

## 2022-07-23 DIAGNOSIS — H43813 Vitreous degeneration, bilateral: Secondary | ICD-10-CM

## 2022-07-23 DIAGNOSIS — H353231 Exudative age-related macular degeneration, bilateral, with active choroidal neovascularization: Secondary | ICD-10-CM

## 2022-07-25 ENCOUNTER — Telehealth: Payer: Medicare Other

## 2022-07-30 ENCOUNTER — Ambulatory Visit: Payer: Medicare Other | Admitting: Psychology

## 2022-07-30 ENCOUNTER — Ambulatory Visit (INDEPENDENT_AMBULATORY_CARE_PROVIDER_SITE_OTHER): Payer: Medicare Other | Admitting: Psychology

## 2022-07-30 ENCOUNTER — Encounter: Payer: Self-pay | Admitting: Psychology

## 2022-07-30 DIAGNOSIS — F329 Major depressive disorder, single episode, unspecified: Secondary | ICD-10-CM | POA: Insufficient documentation

## 2022-07-30 DIAGNOSIS — R4189 Other symptoms and signs involving cognitive functions and awareness: Secondary | ICD-10-CM

## 2022-07-30 DIAGNOSIS — G20 Parkinson's disease: Secondary | ICD-10-CM | POA: Diagnosis not present

## 2022-07-30 DIAGNOSIS — H3530 Unspecified macular degeneration: Secondary | ICD-10-CM | POA: Insufficient documentation

## 2022-07-30 DIAGNOSIS — F4312 Post-traumatic stress disorder, chronic: Secondary | ICD-10-CM | POA: Insufficient documentation

## 2022-07-30 DIAGNOSIS — F0670 Mild neurocognitive disorder due to known physiological condition without behavioral disturbance: Secondary | ICD-10-CM

## 2022-07-30 DIAGNOSIS — F4310 Post-traumatic stress disorder, unspecified: Secondary | ICD-10-CM | POA: Insufficient documentation

## 2022-07-30 HISTORY — DX: Mild neurocognitive disorder due to known physiological condition without behavioral disturbance: F06.70

## 2022-07-30 NOTE — Progress Notes (Unsigned)
NEUROPSYCHOLOGICAL EVALUATION [place] Neu[facility]  Date of Evaluation: July 30, 2022  Reason for Referral:   [name] is a 81 y.o. right-handed Caucasian male referred by Alonza Bogus, D.O., to characterize his current cognitive functioning and assist with diagnostic clarity and treatment planning in the context of ongoing consideration of deep brain stimulation (DBS) surgery for treatment of tremors related to Parkinson's disease.  Assessment and Plan:   Clinical Impression(s): Overall, [name]'s pattern of performance is suggestive of an isolated impairment surrounding delayed retrieval of previously learned list-based information. However, performances across story-based recall were appropriate, as were recognition/consolidation performances across both memory tasks. Further performance variability was exhibited across complex attention and semantic fluency, while a relative weaknesses was exhibited across verbal reasoning abilities. Performances were appropriate relative to age-matched peers across processing speed, basic attention, cognitive flexibility, safety/judgment, receptive language, phonemic fluency, and confrontation naming. Visuospatial abilities could not be assessed due to macular degeneration. Trouble with activities of daily living were primarily attributed to tremor and visual acuity concerns rather than cognitive decline. As such, given evidence for cognitive dysfunction described above, he meets criteria for a Mild Neurocognitive Disorder (formerly "mild cognitive impairment") at the present time.  Regarding etiology, patterns of weakness across testing align reasonably well with what is typically seen in Parkinson's disease. As such, this represents a likely cause for current testing patterns. Testing patterns would also align reasonably well with a primary vascular etiology given his medical history (i.e.,  hypertension, hyperlipidemia, type II diabetes, obstructive sleep apnea). There was no neuroimaging available to review the extent of microvascular ischemic disease.  Important Considerations for DBS Candidacy:  1. Is the patient experiencing cognitive symptoms that far exceed what is expected for their situation? I do not believe so. Patterns across testing can be reasonably seen in both Parkinson's disease and complex medical/cerebrovascular conditions. Performances and day-to-day functioning do not arise where a dementia designation would be warranted. 2. Is there a separate neurological process at work? There is no current indication of this. Visuospatial abilities could not be assessed; however, he did not report common behavioral symptoms of Lewy body disease or another more rare parkinsonian condition. Despite memory variability, overall memory performances are not consistent with Alzheimer's disease. He did not report any personality change suggestive of frontotemporal lobar degeneration.  3. Were any psychological stressors identified within the individual and/or family beyond PD that may impact post-operative adjustment? No. He denied ongoing psychiatric distress during interview and responses across mood-related questionnaires were consistent with this reporting. He did acknowledge a history of PTSD stemming from his time in the [employer] during the [place] [employer]. However, he did not report active, ongoing symptoms related to this condition presently.  4. Can this person cope with the stress of surgery and be compliant as an awake participant in surgery? He expressed a desire to undergo DBS and I do not have evidence to suggest that he could not be a compliant participant in surgery.   5. Can this person participate in the multiple post-operative programming sessions and medication adjustments? Yes, I believe so. 6. From a neuropsychological perspective, does this person appear to be a good candidate  for DBS? Yes, I believe so. There is nothing unexpected across the current evaluation given his medical history. Importantly, the current evaluation does not represent full surgical clearance. Rather, should he be deemed a viable surgical candidate by other members of his surgical team, performances across the current  evaluation should not exclude him from this procedure.   Recommendations: Neuroimaging in the form of a brain MRI will be useful in determining underlying cerebrovascular contributions to current cognitive performances. This will also be a necessary step of his pre-surgical work-up for DBS.   [name] is encouraged to attend to lifestyle factors for brain health (e.g., regular physical exercise, good nutrition habits, regular participation in cognitively-stimulating activities, and general stress management techniques), which are likely to have benefits for both emotional adjustment and cognition. Optimal control of vascular risk factors (including safe cardiovascular exercise and adherence to dietary recommendations) is encouraged. Likewise, continued compliance with his CPAP machine will also be important. Continued participation in activities which provide mental stimulation and social interaction is also recommended.   Memory can be improved using internal strategies such as rehearsal, repetition, chunking, mnemonics, association, and imagery. External strategies such as written notes in a consistently used memory journal, visual and nonverbal auditory cues such as a calendar on the refrigerator or appointments with alarm, such as on a cell phone, can also help maximize recall.    Because he shows better recall for structured information, he will likely understand and retain new information better if it is presented to him in a meaningful or well-organized manner at the outset, such as grouping items into meaningful categories or presenting information in an outlined, bulleted, or story  format.   To address problems with fluctuating attention and executive dysfunction, he may wish to consider:   -Avoiding external distractions when needing to concentrate   -Limiting exposure to fast paced environments with multiple sensory demands   -Writing down complicated information and using checklists   -Attempting and completing one task at a time (i.e., no multi-tasking)   -Verbalizing aloud each step of a task to maintain focus   -Reducing the amount of information considered at one time  Review of Records:   Mr. Laviolette was seen by [facility] Wells Guiles Tat, D.O.) on 09/24/2021 for an evaluation of tremor. Prior concerns surrounded essential tremor in 2018 and he was prescribed propranolol. However, this yielded concerns surrounding low blood pressure and was stopped. Between 2018-2022, he developed a resting tremor and sought re-evaluation. Symptoms were said to be pronounced in his upper extremities (R > L). He noted tremors creating difficulties while attempting to eat and drink. He noted some postural symptoms when standing, as well as a shuffling gait. Mildly increased tone was noted in his RUE. There was also mild decremation with RAMs, R > L. 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 Carbidopa-levodopa medication was started.   He most recently met with Dr. Carles Collet on 04/02/2022 for follow-up. 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 He expressed a desire for surgical intervention (DBS) to treat ongoing symptoms as they significantly influence his day-to-day functioning. As a part of his pre-surgical work-up, he was referred for a comprehensive neuropsychological evaluation to characterize ongoing cognitive abilities.   No neuroimaging was available for review.   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 Thought to be agent orange related-CLL and prostate cancer  Diagnosis 1. Prostate, radical resection - PROSTATIC ADENOCARCINOMA, GLEASON'S SCORE 3+4=7,  INVOLVING BOTH LOBES. - NO EVIDENCE OF EXTRAPROSTATIC EXTENSION, ANGIOLYMPHATIC INVASION OR SEMINAL VESICLE INVOLVEMENT IDENTIFIED. - RESECTION MARGINS, NEGATIVE FOR ATYPIA OR MALIGNANCY. 2. Lymph nodes, regional resection, righ   Chronic pain syndrome 02/01/2016   Chronic right hip pain 09/27/2019   DDD (degenerative disc disease), lumbar    Diabetic retinopathy 09/24/2007   Diverticulosis of colon    Elevated PSA    Erectile dysfunction 12/30/2007   No rx.      GERD (gastroesophageal reflux disease)    Gynecomastia 12/28/2020   History of colonic polyps 08/22/2008   2004 3 adenomas 2009 none 2013 none 02/26/2017 8 mm sigmoid polyp was inflammatory - no recall planned given age/co-morbidities overall hx of polyps     History of prostate cancer 01/24/2014   Follows with Dr. Roni Bread every 3 months. Dr. Alen Blew every 6 months.  Diagnosis 1. 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 - NO EVIDENCE OF EXTRAPROSTATIC EXTENSION, ANGIOLYMPHATIC INVASION OR SEMINAL VESICLE INVOLVEMENT IDENTIFIED. - RESECTION MARGINS, NEGATIVE FOR ATYPIA OR MALIGNANCY. 2. Lymph nodes, regional resection, right pelvic - SIX   Hyperlipidemia associated with type 2 diabetes mellitus 09/24/2007   High triglycerides. Refused statin due to myalgias in past but then started half tablet of [place]vastatin [place] be different [place] the original. Formatting of this note might be different from the original. High triglycerides. Refused statin due to myalgias in past but then started half tablet of simvastatin 475m through [facility] Formatting    Hypertension associated with diabetes 09/24/2007   Amlodipine 1076mlosartan 100m51mCTZ 25mg32mopranolol 40 mg twice a day Home cuff 142/85 compared to 134/72 on my check. Likely home cuff about 10 points higher.   --> chlorthalidone 03/20/16 but patient didn't change and then BP controlled on next visit   Formatting of this note might be different from the original. Formatting of this note might be different from the original. 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  Follow up in 6 months unless wound issues arise.  Derm follow up in October 2022 scheduled.  I will see him back in 6 months.   OSA on CPAP 09/24/2007   Parkinson's disease 12/20/2021   PONV (postoperative nausea and vomiting)    PTSD (post-traumatic stress disorder)    [facility]   Sacroiliac joint dysfunction 06/29/2018   Senile purpura 05/02/2020   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 myelopathy or radiculopathy 02/01/2016   Temporomandibular joint (TMJ) pain 04/14/2017   Tremor 08/11/2015   Type 2 diabetes mellitus 09/24/2007   Lantus 102 units, novolog 46 units 3x a day with meals . a1c under 8     Formatting of this note might be different from the original. Formatting of this note might be different from the original. And macular degeneration.   Last Assessment & Plan:  Formatting of this note might be different from the original. S: diabetic retinopathy followed up by optho yesterday and largely stable. He also has m   Vitamin D deficiency 11/01/2020   [facility] a day at least since 2021     Past Surgical History:  Procedure Laterality Date    CHOLECYSTECTOMY     COLONOSCOPY  03/25/12, 09/28/08   ESOPHAGOGASTRODUODENOSCOPY N/A 10/12/2014   Procedure: ESOPHAGOGASTRODUODENOSCOPY (EGD);  Surgeon: Gatha Mayer, MD;  Location: [facility];  Service: Endoscopy;  Laterality: N/A;   ESOPHAGOGASTRODUODENOSCOPY ENDOSCOPY  09/28/08   EXCISION MELANOMA WITH SENTINEL LYMPH NODE BIOPSY Right 05/31/2021   Procedure: WIDE LOCAL EXCISION RIGHT FOREARM WITH ADVANCEDMENT FLAP CLOSURE WITH SENTINEL LYMPH NODE MAPPING AND BIOSPY;  Surgeon: [name], MD;  Location: [facility];  Service: General;  Laterality: Right;   FOOT SURGERY Bilateral    HEMORRHOID SURGERY     LAPAROSCOPIC GASTRIC BANDING  03/05/11   weight loss   LAPAROSCOPIC GASTRIC BANDING WITH HIATAL HERNIA REPAIR  03/05/2011   LYMPHADENECTOMY Bilateral 01/24/2014   Procedure: LYMPHADENECTOMY;  Surgeon: Dutch Gray, MD;  Location: [facility];  Service: Urology;  Laterality: Bilateral;   ORIF TIBIA FRACTURE Right    PENILE PROSTHESIS IMPLANT     PROSTATE SURGERY  01/2014   ROBOT ASSISTED LAPAROSCOPIC RADICAL PROSTATECTOMY N/A 01/24/2014   Procedure: ROBOTIC ASSISTED LAPAROSCOPIC RADICAL PROSTATECTOMY LEVEL 3;  Surgeon: Dutch Gray, MD;  Location: [facility];  Service: Urology;  Laterality: N/A;   SHOULDER SURGERY Right 2011   TONSILLECTOMY     age 73   VASECTOMY      Current Outpatient Medications:    amLODipine (NORVASC) 10 MG tablet, Take 10 mg by mouth daily., Disp: , Rfl:    azelastine (ASTELIN) 0.1 % nasal spray, Place 1 spray into both nostrils 2 (two) times daily. Use in each nostril as directed, Disp: 30 mL, Rfl: 12   busPIRone (BUSPAR) 10 MG tablet, Take by mouth 3 (three) times daily., Disp: , Rfl:    carbidopa-levodopa (SINEMET IR) 25-100 MG tablet, Take 1 tablet by mouth 3 (three) times daily. 6am/10am/2pm, Disp: 90 tablet, Rfl: 0   carbidopa-levodopa (SINEMET IR) 25-100 MG tablet, Take 1 tablet by mouth 3 (three) times daily., Disp: 270 tablet, Rfl: 1   Cholecalciferol 50  MCG (2000 UT) TABS, Take by mouth., Disp: , Rfl:    empagliflozin (JARDIANCE) 25 MG TABS tablet, Take 1 tablet (25 mg total) by mouth daily before breakfast. ([facility]), Disp: 30 tablet, Rfl:    fenofibrate (TRICOR) 145 MG tablet, Take 145 mg by mouth daily., Disp: , Rfl:    fluticasone (FLONASE) 50 MCG/ACT nasal spray, Place into both nostrils., Disp: , Rfl:    gabapentin (NEURONTIN) 300 MG capsule, Take 3 caps in PM and 3 caps at bedtime and continue., Disp: , Rfl:    glucose 4 GM chewable tablet, Chew by mouth., Disp: , Rfl:    glucose blood test strip, Use to test 4 times daily. E11.9, Disp: 100 each, Rfl: 12   glucose-Vitamin C 4-0.006 GM CHEW chewable tablet, Chew by mouth., Disp: , Rfl:    hydrochlorothiazide (HYDRODIURIL) 25 MG tablet, Take 25 mg by mouth daily., Disp: , Rfl:    hydrocortisone 2.5 % cream, SMARTSIG:1 Topical Daily, Disp: , Rfl:    hydrocortisone 2.5 % cream, SMARTSIG:1 Topical Daily, Disp: , Rfl:    hydrOXYzine (ATARAX) 10 MG tablet, Take 10 mg by mouth 3 (three) times daily., Disp: , Rfl:    insulin aspart (NOVOLOG) 100 UNIT/ML injection, Inject 30 Units into the skin 3 (three) times daily with meals. When sugar is elevated, Disp: , Rfl:  insulin glargine (LANTUS) 100 UNIT/ML injection, Inject 50 Units into the skin at bedtime., Disp: , Rfl:    lansoprazole (PREVACID) 30 MG capsule, Take 1 capsule (30 mg total) by mouth daily at 12 noon., Disp: 30 capsule, Rfl: 5   levothyroxine (SYNTHROID, LEVOTHROID) 50 MCG tablet, Take 50 mcg by mouth daily before breakfast., Disp: , Rfl:    losartan (COZAAR) 100 MG tablet, Take 100 mg by mouth daily., Disp: , Rfl:    mirtazapine (REMERON) 15 MG tablet, Take 15 mg by mouth at bedtime., Disp: , Rfl:    ondansetron (ZOFRAN ODT) 4 MG disintegrating tablet, Take 1 tablet (4 mg total) by mouth every 8 (eight) hours as needed for nausea or vomiting., Disp: 20 tablet, Rfl: 0   OVER THE COUNTER MEDICATION, PreserVision for eyes, Disp: ,  Rfl:    potassium chloride SA (K-DUR,KLOR-CON) 20 MEQ tablet, Take 20 mEq by mouth daily., Disp: , Rfl:    simvastatin (ZOCOR) 80 MG tablet, Take half tablet (40 mg) by mouth once daily, Disp: , Rfl:    sucralfate (CARAFATE) 1 G tablet, Take 1 g by mouth 4 (four) times daily -  with meals and at bedtime., Disp: , Rfl:    trimethoprim-polymyxin b (POLYTRIM) ophthalmic solution, Apply to eye., Disp: , Rfl:    zolpidem (AMBIEN) 5 MG tablet, Take 5 mg by mouth at bedtime as needed for sleep., Disp: , Rfl:   Clinical Interview:   The following information was obtained during a clinical interview with Mr. Rosevear prior to cognitive testing.  Cognitive Symptoms: Decreased short-term memory: Endorsed. However, difficulties were primarily described as trouble recalling names while in conversation. He generally denied difficulties surrounding trouble recalling past conversations, trouble misplacing or losing things, or asking repetitive questions. Difficulties with name recollection was said to be longstanding in nature.  Decreased long-term memory: Denied. Decreased attention/concentration: Denied. Reduced processing speed: Denied. Difficulties with executive functions: Denied. He also denied trouble with impulsivity or any significant personality changes.  Difficulties with emotion regulation: Denied. Difficulties with receptive language: Denied. Difficulties with word finding: Denied. Decreased visuoperceptual ability: Denied.  Trajectory of deficits: He denied all cognitive concerns outside of longstanding and occasional difficulties with name recollection.   Difficulties completing ADLs: Somewhat. He reported managing his morning medications independently while he may have some assistance from his wife with evening doses and reminders. His wife manages finances and bill paying which is longstanding in nature. He stopped driving primarily due to macular degeneration and overall vision difficulties.    Additional Medical History: History of traumatic brain injury/concussion: Denied. History of stroke: Denied. History of seizure activity: Denied. History of known exposure to toxins: Denied. Symptoms of chronic pain: Denied. Experience of frequent headaches/migraines: Denied. Frequent instances of dizziness/vertigo: Denied.  Sensory changes: He has a history of macular degeneration which does impact overall vision. He also has bilateral hearing loss and utilizes hearing aids well. He further reported a diminished sense of smell which he attributed to Parkinson's disease.  Balance/coordination difficulties: Largely denied. He noted that he might stagger at times while walking, especially after first standing up. He denied any recent falls or perception of lower extremity weakness. One side was not said to be worse than the other.  Other motor difficulties: Endorsed. Significant resting tremors were reported in his upper extremities bilaterally (R > L). These were said to largely be medication resistant. These appear to have worsened over time.  Sleep History: Estimated hours obtained each night: 7-8 hours.  Difficulties falling asleep: Endorsed. However, since being prescribed Ambien, he reported falling asleep quickly and without consistent difficulty.  Difficulties staying asleep: Denied. Feels rested and refreshed upon awakening: Endorsed.  History of snoring: Endorsed. History of waking up gasping for air: Endorsed. Witnessed breath cessation while asleep: Endorsed. He acknowledged a history of obstructive sleep apnea and reported utilizing his CPAP device nightly.   History of vivid dreaming: Denied. Excessive movement while asleep: Denied. Instances of acting out his dreams: Denied.  Psychiatric/Behavioral Health History: Depression: Denied. When asked his current mood, he responded "I ain't letting nothing get to me." He denied to his knowledge any prior mental health concerns  or diagnoses specific to depressive symptoms. Medical records do suggest a history of major depressive disorder and his neurologist has raised concerns surrounding depression in the recent past. Current or remote suicidal ideation, intent, or plan was denied.  Anxiety: Denied. However, his neurologist has also raised concerns surrounding generalized anxiety symptoms in the recent past.  Mania: Denied. Trauma History: Endorsed. He reported a prior diagnosis of PTSD stemming from time spent as an [place] during the [place]. He did not describe current symptoms influencing his day-to-day life.  Visual/auditory hallucinations: Denied. Delusional thoughts: Denied.  Tobacco: Denied. Alcohol: He denied current alcohol consumption as well as a history of problematic alcohol abuse or dependence.  Recreational drugs: Denied.  Family History: Problem Relation Age of Onset   Hypertension Mother    Stomach cancer Mother 40   Alcohol abuse Brother    Diabetes Child    Hypertension Child    Hypertension Paternal Uncle    Heart attack Paternal Uncle    Parkinson's disease Other        several cousins and uncles   Colon cancer Neg Hx    Colon polyps Neg Hx    Rectal cancer Neg Hx    This information was confirmed by Mr. Gugel.  Academic/Vocational History: Highest level of educational attainment: 14 years. He graduated from high school and completed the equivalency of two additional years of community college. 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 No relative weaknesses were identified.  History of developmental delay: Denied. History of grade repetition: Denied. Enrollment in special education courses: Denied. History of LD/ADHD: Denied.  Employment: Retired. He spent two years in the [place] [employer] and saw combat during the [place] War. 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   Evaluation Results:   Behavioral  Observations: Mr. States was unaccompanied, arrived to his appointment on time, and was appropriately dressed and groomed. He appeared alert and oriented. He ambulated slowly and with a mildly shuffling gait. No frank instability was observed. Resting tremors involving his upper extremities (R > L) were very apparent during interview. Mild hearing loss was noted despite hearing aid utilization. His affect was generally relaxed and positive, but did range appropriately given the subject being discussed during the clinical interview or the task at hand during testing procedures. Spontaneous speech was fluent and word finding difficulties were not observed during the clinical interview or testing procedures. Thought processes were coherent, organized, and normal in content. Insight into his cognitive difficulties appeared somewhat limited in that he generally denied all cognitive concerns despite objective testing revealing several weaknesses. However, he may simply have been attempting to portray himself in an overly favorable light during interview given his desire for DBS clearance.   Given the significant of bilateral tremors, motorized tasks  were excluded from the testing battery. Additionally, due to macular degeneration preventing reliable use of even large font versions of tasks, tasks with visual components had to further be excluded from the testing battery. During testing, mild hearing loss was observed. Sustained attention was appropriate. Task engagement was adequate and he persisted when challenged. Mood-related questionnaires were read aloud. Overall, Mr. Creekmore was cooperative with the clinical interview and subsequent testing procedures.   Adequacy of Effort: The validity of neuropsychological testing is limited by the extent to which the individual being tested may be assumed to have exerted adequate effort during testing. Mr. Vivero expressed his intention to perform to the best of his  abilities and exhibited adequate task engagement and persistence. Scores across stand-alone and embedded performance validity measures were within expectation. As such, the results of the current evaluation are believed to be a valid representation of Mr. Mcglothen current cognitive functioning.  Test Results: Mr. Blasius was largely oriented at the time of the current evaluation. Points were lost for him being three days off when stating the current date and one day off when stating the current day of the week.  Intellectual abilities based upon educational and vocational attainment were estimated to be in the average range. Premorbid abilities were estimated to be within the below average range based upon several subtests used to calculate overall intellectual abilities.    Processing speed was below average. Basic attention was below average to average. More complex attention (e.g., working memory) was variable, ranging from the well below average to average normative ranges. Executive functioning was variable. Verbal reasoning abilities were well below average while assessed cognitive flexibility was above average. He also performed in the well above average range across a task assessing safety and judgment.  Assessed receptive language abilities were average. Likewise, Mr. Kinser did not exhibit any difficulties comprehending task instructions and answered all questions asked of him during interview appropriately. Assessed expressive language (e.g., verbal fluency and confrontation naming) was largely below average to average. He did exhibit some variability across semantic fluency.     Visuospatial/visuoconstructional abilities were unable to be assessed given ongoing macular degeneration.    Learning (i.e., encoding) of novel verbal information was below average. Spontaneous delayed recall (i.e., retrieval) of previously learned information was exceptionally low across a list of words but below  average across story content. Retention rates were 75% across a story learning task and 0% across a list learning task. Performance across recognition tasks was below average to average, suggesting evidence for information consolidation.   Results of emotional screening instruments suggested that recent symptoms of generalized anxiety were in the minimal range, while symptoms of depression were within normal limits. A screening instrument assessing recent sleep quality suggested the presence of minimal sleep dysfunction.  Tables of Scores:   Note: This summary of test scores accompanies the interpretive report and should not be considered in isolation without reference to the appropriate sections in the text. Descriptors are based on appropriate normative data and may be adjusted based on clinical judgment. Terms such as "Within Normal Limits" and "Outside Normal Limits" are used when a more specific description of the test score cannot be determined.       Percentile - Normative Descriptor > 98 - Exceptionally High 91-97 - Well Above Average 75-90 - Above Average 25-74 - Average 9-24 - Below Average 2-8 - Well Below Average < 2 - Exceptionally Low       Orientation:  Raw Score Percentile   NAB Orientation, Form 1 26/29 --- ---       Cognitive Screening:     RBANS, Form A: Standard Score/ Scaled Score Percentile   Total Score --- --- ---  Immediate Memory 81 10 Below Average    List Learning 6 9 Below Average    Story Memory 7 16 Below Average  Visuospatial/Constructional --- --- ---    Figure Copy --- --- ---    Line Orientation --- --- ---  Language 82 12 Below Average    Picture Naming 10/10 51-75 Average    Semantic Fluency 3 1 Exceptionally Low  Attention --- --- ---    Digit Span 7 16 Below Average    Coding --- --- ---  Delayed Memory --- --- ---    List Recall 0/10 <2 Exceptionally Low    List Recognition 19/20 26-50 Average    Story Recall 7 16 Below Average     Story Recognition 9/12 16-26 Below Average    Figure Recall --- --- ---    Figure Recognition --- --- ---        Intellectual Functioning:      Standard Score Percentile   WAIS-IV Vocabulary: 7 16 Below Average       Attention/Executive Function:     Oral Trail Making Test (OTMT): Raw Score (Z-Score) Percentile     Part A 9 secs.,  0 errors (-0.77) 22 Below Average    Part B 28 secs.,  0 errors (0.7) 76 Above Average        Standard Score Percentile   WAIS-IV Working Memory Index: 83 13 Below Average        Scaled Score Percentile   WAIS-IV Arithmetic: 6 9 Below Average  WAIS-IV Digit Span: 8 25 Average    Forward 10 50 Average    Backward 10 50 Average    Sequencing 4 2 Well Below Average        Scaled Score Percentile   WAIS-IV Similarities: 5 5 Well Below Average       NAB Executive Functions Module, Form 1: T Score Percentile     Judgment 68 42 Well Above Average       Language:     Boston Diagnostic Aphasia Examination (BDAE) Raw Score Percentile     Complex Ideational Material: 11/12 --- Average       Verbal Fluency Test: Raw Score (Scaled Score) Percentile     Phonemic Fluency (CFL) 23 (7) 16 Below Average    Category Fluency 33 (9) 37 Average  *Based on [employer] Older Normative Studies (MOANS)          NAB Language Module, Form 1: T Score Percentile     Naming 29/31 (48) 42 Average       Mood and Personality:      Raw Score Percentile   PROMIS Depression Questionnaire: 8 --- None to Slight  PROMIS Anxiety Questionnaire: 7 --- None to Slight       Additional Questionnaires:      Raw Score Percentile   PROMIS Sleep Disturbance Questionnaire: 8 --- None to Slight   Informed Consent and Coding/Compliance:   Mr. Haff was provided with a verbal description of the nature and purpose of the present neuropsychological evaluation. Also reviewed were the foreseeable risks and/or discomforts and benefits of the procedure, limits of confidentiality, and mandatory  reporting requirements of this provider. The patient was given the opportunity to ask questions and receive answers about the evaluation.  Oral consent to participate was provided by the patient.   This evaluation was conducted by Christia Reading, Ph.D., ABPP-CN, board certified clinical neuropsychologist. Mr. Godman completed a comprehensive clinical interview with Dr. Melvyn Novas, billed as one unit [phone], and 105 minutes of cognitive testing and scoring, billed as one unit [phone] and three additional units 96139. Psychometrist Milana Kidney, B.S., assisted Dr. Melvyn Novas with test administration and scoring procedures. As a separate and discrete service, Dr. Melvyn Novas spent a total of 160 minutes in interpretation and report writing billed as one unit [phone] and two units 96133.

## 2022-07-30 NOTE — Progress Notes (Signed)
   Psychometrician Note   Cognitive testing was administered to [name] by Milana Kidney, B.S. (psychometrist) under the supervision of Dr. Christia Reading, Ph.D., licensed psychologist on 07/30/2022. [name] did not appear overtly distressed by the testing session per behavioral observation or responses across self-report questionnaires. Rest breaks were offered.    The battery of tests administered was selected by Dr. Christia Reading, Ph.D. with consideration to [name]'s current level of functioning, the nature of his symptoms, emotional and behavioral responses during interview, level of literacy, observed level of motivation/effort, and the nature of the referral question. This battery was communicated to the psychometrist. Communication between Dr. Christia Reading, Ph.D. and the psychometrist was ongoing throughout the evaluation and Dr. Christia Reading, Ph.D. was immediately accessible at all times. Dr. Christia Reading, Ph.D. provided supervision to the psychometrist on the date of this service to the extent necessary to assure the quality of all services provided.    [name] will return within approximately 1-2 weeks for an interactive feedback session with Dr. Melvyn Novas at which time his test performances, clinical impressions, and treatment recommendations will be reviewed in detail. [name] understands he can contact our office should he require our assistance before this time.  A total of 105 minutes of billable time were spent face-to-face with [name] by the psychometrist. This includes both test administration and scoring time. Billing for these services is reflected in the clinical report generated by Dr. Christia Reading, Ph.D.  This note reflects time spent with the psychometrician and does not include test scores or any clinical interpretations made by Dr. Melvyn Novas. The full report will follow in a separate note.

## 2022-07-31 ENCOUNTER — Encounter: Payer: Self-pay | Admitting: Psychology

## 2022-08-01 DIAGNOSIS — G8929 Other chronic pain: Secondary | ICD-10-CM | POA: Diagnosis not present

## 2022-08-01 DIAGNOSIS — M533 Sacrococcygeal disorders, not elsewhere classified: Secondary | ICD-10-CM | POA: Diagnosis not present

## 2022-08-01 DIAGNOSIS — M25551 Pain in right hip: Secondary | ICD-10-CM | POA: Diagnosis not present

## 2022-08-01 DIAGNOSIS — M47816 Spondylosis without myelopathy or radiculopathy, lumbar region: Secondary | ICD-10-CM | POA: Diagnosis not present

## 2022-08-07 ENCOUNTER — Ambulatory Visit: Payer: Medicare Other | Admitting: Oncology

## 2022-08-07 ENCOUNTER — Other Ambulatory Visit: Payer: Medicare Other

## 2022-08-19 DIAGNOSIS — M47816 Spondylosis without myelopathy or radiculopathy, lumbar region: Secondary | ICD-10-CM | POA: Diagnosis not present

## 2022-08-22 ENCOUNTER — Ambulatory Visit (INDEPENDENT_AMBULATORY_CARE_PROVIDER_SITE_OTHER): Payer: Medicare Other | Admitting: Family Medicine

## 2022-08-22 ENCOUNTER — Encounter: Payer: Self-pay | Admitting: Family Medicine

## 2022-08-22 ENCOUNTER — Telehealth: Payer: Self-pay

## 2022-08-22 VITALS — BP 138/70 | HR 81 | Temp 97.8°F | Ht 72.0 in | Wt 259.6 lb

## 2022-08-22 DIAGNOSIS — E1169 Type 2 diabetes mellitus with other specified complication: Secondary | ICD-10-CM | POA: Diagnosis not present

## 2022-08-22 DIAGNOSIS — E538 Deficiency of other specified B group vitamins: Secondary | ICD-10-CM | POA: Diagnosis not present

## 2022-08-22 DIAGNOSIS — E039 Hypothyroidism, unspecified: Secondary | ICD-10-CM | POA: Diagnosis not present

## 2022-08-22 DIAGNOSIS — E11319 Type 2 diabetes mellitus with unspecified diabetic retinopathy without macular edema: Secondary | ICD-10-CM | POA: Diagnosis not present

## 2022-08-22 DIAGNOSIS — Z794 Long term (current) use of insulin: Secondary | ICD-10-CM | POA: Diagnosis not present

## 2022-08-22 DIAGNOSIS — E785 Hyperlipidemia, unspecified: Secondary | ICD-10-CM | POA: Diagnosis not present

## 2022-08-22 DIAGNOSIS — C9110 Chronic lymphocytic leukemia of B-cell type not having achieved remission: Secondary | ICD-10-CM | POA: Diagnosis not present

## 2022-08-22 DIAGNOSIS — Z8546 Personal history of malignant neoplasm of prostate: Secondary | ICD-10-CM | POA: Diagnosis not present

## 2022-08-22 LAB — COMPREHENSIVE METABOLIC PANEL
ALT: 23 U/L (ref 0–53)
AST: 24 U/L (ref 0–37)
Albumin: 4.2 g/dL (ref 3.5–5.2)
Alkaline Phosphatase: 60 U/L (ref 39–117)
BUN: 12 mg/dL (ref 6–23)
CO2: 30 mEq/L (ref 19–32)
Calcium: 9 mg/dL (ref 8.4–10.5)
Chloride: 100 mEq/L (ref 96–112)
Creatinine, Ser: 1.13 mg/dL (ref 0.40–1.50)
GFR: 62.03 mL/min (ref >60.00)
Glucose, Bld: 304 mg/dL — ABNORMAL HIGH (ref 70–99)
Potassium: 4.3 mEq/L (ref 3.5–5.1)
Sodium: 138 mEq/L (ref 135–145)
Total Bilirubin: 1.5 mg/dL — ABNORMAL HIGH (ref 0.2–1.2)
Total Protein: 6.3 g/dL (ref 6.0–8.3)

## 2022-08-22 LAB — CBC WITH DIFFERENTIAL/PLATELET
Basophils Absolute: 0.1 10*3/uL (ref 0.0–0.1)
Basophils Relative: 0.4 % (ref 0.0–3.0)
Eosinophils Absolute: 0.2 10*3/uL (ref 0.0–0.7)
Eosinophils Relative: 0.7 % (ref 0.0–5.0)
HCT: 44.2 % (ref 39.0–52.0)
Hemoglobin: 14.6 g/dL (ref 13.0–17.0)
Lymphocytes Relative: 76.4 % — ABNORMAL HIGH (ref 12.0–46.0)
Lymphs Abs: 26.9 10*3/uL — ABNORMAL HIGH (ref 0.7–4.0)
MCHC: 33.1 g/dL (ref 30.0–36.0)
MCV: 86.9 fl (ref 78.0–100.0)
Monocytes Absolute: 0.7 10*3/uL (ref 0.1–1.0)
Monocytes Relative: 1.9 % — ABNORMAL LOW (ref 3.0–12.0)
Neutro Abs: 7.3 10*3/uL (ref 1.4–7.7)
Neutrophils Relative %: 20.6 % — ABNORMAL LOW (ref 43.0–77.0)
Platelets: 170 10*3/uL (ref 150.0–400.0)
RBC: 5.08 Mil/uL (ref 4.22–5.81)
RDW: 14.9 % (ref 11.5–15.5)
WBC: 35.3 10*3/uL (ref 4.0–10.5)

## 2022-08-22 LAB — MICROALBUMIN / CREATININE URINE RATIO
Creatinine,U: 84.5 mg/dL
Microalb Creat Ratio: 0.8 mg/g (ref 0.0–30.0)
Microalb, Ur: <0.7 mg/dL (ref 0.0–1.9)

## 2022-08-22 MED ORDER — CYANOCOBALAMIN 1000 MCG/ML IJ SOLN
1000.0000 ug | Freq: Once | INTRAMUSCULAR | Status: AC
  Administered 2022-08-22: 1000 ug via INTRAMUSCULAR

## 2022-08-22 MED ORDER — GVOKE HYPOPEN 2-PACK 0.5 MG/0.1ML ~~LOC~~ SOAJ: SUBCUTANEOUS | 1 refills | Status: DC

## 2022-08-22 NOTE — Patient Instructions (Addendum)
Have diabetic Exam faxed to [place] at [phone].   Let us know when you get the COVID vaccine at your pharmacy.  Double check med list at home- let me know anything that is on there that he is not taking and the reverse anything he is taking that is not on medicine list- Bevelyn Ngo can update the list for [place]  Please stop by lab before you go If you have mychart- we will send your results within 3 business days of [place] receiving them.  If you do not have mychart- we will call you about results within 5 business days of [place] receiving them.  *please also note that you will see labs on mychart as soon as they post. I will later go in and write notes on them- will say "notes from Dr. Yong Channel"    Recommended follow up: Return in about 4 months (around 12/22/2022) for followup or sooner if needed.Schedule b4 you leave.

## 2022-08-22 NOTE — Telephone Encounter (Signed)
[name] form lab called regarding patient WBC critical level of 35.3. States normal is typically a 10. Advised I would pass to PCP

## 2022-08-22 NOTE — Telephone Encounter (Signed)
This is consistent with patients baseline- he has CLL- trending up slightly- I will mention on note to him about possibly seeing hematology again but he strongly prefers to hold off on follow up

## 2022-08-22 NOTE — Progress Notes (Signed)
Phone [phone] In person visit   Subjective:   [name] is a 81 y.o. 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    Priority: High   History of prostate cancer 01/24/2014    Priority: High   Type 2 diabetes mellitus 09/24/2007    Priority: High   Diabetic retinopathy 09/24/2007    Priority: High   Hypothyroidism 06/22/2021    Priority: Medium    Tremor 08/11/2015    Priority: Medium    Lapband APL + HH repair 08/12/2013    Priority: Medium    Insomnia 09/21/2009    Priority: Medium    Hyperlipidemia associated with type 2 diabetes mellitus 09/24/2007    Priority: Medium    OSA on CPAP 09/24/2007    Priority: Medium    Essential hypertension 09/24/2007    Priority: Medium    Bell's palsy 05/08/2020    Priority: Low   GERD (gastroesophageal reflux disease) 12/08/2014    Priority: Low   History of colonic polyps 08/22/2008    Priority: Low   Major depressive disorder 07/30/2022   PTSD (post-traumatic stress disorder) 07/30/2022   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/2022   Macular degeneration    Parkinson's disease 12/20/2021   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     azelastine (ASTELIN) 0.1 % nasal spray Place 1 spray into both nostrils 2 (two) times daily. Use in each nostril as directed 30 mL 12   busPIRone (BUSPAR) 10 MG tablet Take by mouth 3 (three) times daily.     carbidopa-levodopa (SINEMET IR) 25-100 MG tablet Take 1 tablet by mouth 3 (three) times daily. 6am/10am/2pm 90 tablet 0   carbidopa-levodopa (SINEMET IR) 25-100 MG tablet Take 1 tablet by mouth 3 (three) times daily. 270 tablet 1   Cholecalciferol 50 MCG (2000 UT) TABS Take by mouth.     empagliflozin (JARDIANCE) 25 MG TABS tablet Take 1 tablet (25 mg total) by mouth daily before breakfast. (GIVEN BY [facility]) 30 tablet    fenofibrate (TRICOR) 145 MG tablet Take 145 mg by mouth daily.     fluticasone (FLONASE) 50 MCG/ACT nasal spray Place into both nostrils.     gabapentin (NEURONTIN) 300 MG capsule Take 3 caps in PM and 3 caps at bedtime and continue.     glucose 4 GM chewable tablet Chew by mouth.     glucose blood test strip Use to test 4 times daily. E11.9 100 each 12   glucose-Vitamin C 4-0.006 GM CHEW chewable tablet Chew by mouth.     GVOKE HYPOPEN 2-PACK 0.5 MG/0.1ML SOAJ Use as needed for low blood sugar- inform doctor office if you have to use  0.2 mL 1   hydrochlorothiazide (HYDRODIURIL) 25 MG tablet Take 25 mg by mouth daily.     hydrocortisone 2.5 % cream SMARTSIG:1 Topical Daily     hydrocortisone 2.5 % cream SMARTSIG:1 Topical Daily     hydrOXYzine (ATARAX) 10 MG tablet Take 10 mg by mouth 3 (three) times daily.     insulin aspart (NOVOLOG) 100 UNIT/ML injection Inject 30 Units into the skin 3 (three) times daily with meals. When sugar is elevated     insulin glargine (LANTUS) 100 UNIT/ML injection Inject 50 Units into the skin at bedtime.     lansoprazole (PREVACID) 30 MG capsule Take 1 capsule (30 mg total) by mouth daily at 12 noon. 30 capsule 5   levothyroxine (SYNTHROID, LEVOTHROID) 50 MCG tablet Take 50 mcg by  mouth daily before breakfast.     losartan (COZAAR) 100 MG tablet Take 100 mg by mouth daily.     mirtazapine (REMERON) 15 MG tablet Take 15 mg by mouth at bedtime.     ondansetron (ZOFRAN ODT) 4 MG disintegrating tablet Take 1 tablet (4 mg total) by mouth every 8 (eight) hours as needed for nausea or vomiting. 20 tablet 0   OVER THE COUNTER MEDICATION PreserVision for eyes     potassium chloride SA (K-DUR,KLOR-CON) 20 MEQ tablet Take 20 mEq by mouth daily.     simvastatin (ZOCOR) 80 MG tablet Take half tablet (40 mg) by mouth once daily     sucralfate (CARAFATE) 1 G tablet Take 1 g by mouth 4 (four) times daily -  with meals and at bedtime.     trimethoprim-polymyxin b (POLYTRIM) ophthalmic solution Apply to eye.     zolpidem (AMBIEN) 5 MG tablet Take 5 mg by mouth at bedtime as needed for sleep.     No current facility-administered medications for this visit.     Objective:  BP 138/70   Pulse 81   Temp 97.8 F (36.6 C)   Ht 6' (1.829 m)   Wt 259 lb 9.6 oz (117.8 kg)   SpO2 94%   BMI 35.21 kg/m  Gen: NAD, resting comfortably CV: RRR no murmurs rubs or gallops Lungs: CTAB no crackles, wheeze, rhonchi Ext: trace edema Skin: warm, dry Neuro: tremor noted     Assessment and Plan   #Hm- had high dose flu shot. Plans on covid shot. Planning on rsv and covid  #Chronic back and hip pain- on tramadol and gabapentin- has planned potential nerve block. Working with Dr. Catheryn Bacon with [facility]  #Parkinson's follows with Dr. Carles Collet S: Medication: Sinemet -On buspirone 5 mg BID for anxiety elementas well as mirtazapine 15 mg through [place] .  Also on Ambien 5 mg to help with sleep- feeling more settled - no falls with that. If misses ambien doesn't sleep. Not sure if taking hydroxyzine.  -Mild cognitive impairment diagnosed 07/30/2022 by Hazle Coca, PhD A/P: upcoming appointment in November with Dr. Carles Collet -still considering deep brain stimulator   #Hyperlipidemia S: Controlled on simvastatin 40  mg-half of 80 mg tablet  [facility] also added gemfibrozil with triglycerides 379-currently on fenofibrate Lab Results  Component Value Date   CHOL 109 06/03/2022   HDL 38 06/03/2022   LDLCALC 11 03/29/2021   LDLDIRECT 55.0 04/22/2022   TRIG 442 (A) 06/03/2022   CHOLHDL 3 04/22/2022   A/P: has been well controlled other than triglycerides- continue current medicines   #Hypertension S: Controlled on hydrochlorothiazide 25 mg, losartan 100 mg, amlodipine 10 mg (some edema in past) -  propranolol for tremor lowered HR today so he stopped this BP Readings from Last 3 Encounters:  08/22/22 138/70  04/22/22 128/68  04/02/22 135/75  A/P:  high normal but ok today- continue current meds.   #Diabetes type 2 with ophthalmic complications- lifestyle meter and freestyle S: Compliant with Lantus 65 units--> 60 units--> 50 units.  Also uses short acting insulin 35-45 units before meals.  Also on Jardiance 25 mg.  A1c goal 7.5 or less.  -has continuous glucose monitoring Lab Results  Component Value Date   HGBA1C 8.1 06/03/2022   HGBA1C 8.2 (H) 04/22/2022   HGBA1C 6.9 (H) 12/20/2021   A/P: slight improvement on last check- continue current meds. No low sugar recently but will give Gvoke glucagon pen in case needed   #Hypothyroidism S: Compliant with levothyroxine 50 mcg Lab Results  Component Value Date   TSH 1.27 04/22/2022  A/P: Suspect Stable. Continue current medications as long as TSH at goal  #B12 deficiency S: history of gastric bypass . have recommended b12 1000 mcg per day- he prefers injections  A/P: injection today- continue monthly   %  Patient with known CLL-was following with Dr. Alen Blew.   -suspect stable- update CBC. He has decided to discontinue follow up with oncology- would not be interested in therapy .   #history of prostate cancer- PSA good a year ago- check with labs Lab Results  Component Value Date   PSA <0.010 07/26/2021   PSA <0.010 09/18/2020   PSA 0.010 06/01/2019    Recommended follow up: Return in about 4 months (around 12/22/2022) for followup or sooner if needed.Schedule b4 you leave. Future Appointments  Date Time Provider Ray  08/27/2022  7:30 AM Hayden Pedro, MD [facility] None  09/11/2022 10:00 AM Hazle Coca, PhD [facility] None  09/18/2022 10:30 AM [facility] NURSE [facility]  10/08/2022  2:30 PM Tat, Eustace Quail, DO [facility] None  11/15/2022 11:45 AM [facility] HEALTH COACH [facility]    Lab/Order associations:   ICD-10-CM   1. Type 2 diabetes mellitus with retinopathy of both eyes, with long-term current use of insulin, macular edema presence unspecified, unspecified retinopathy severity (HCC)  E11.319 Microalbumin / creatinine urine ratio   Z79.4 CBC with Differential/Platelet    Comprehensive metabolic panel    2. Chronic lymphocytic leukemia  C91.10     3. History of prostate cancer  Z85.46 PSA    4. Hyperlipidemia associated with type 2 diabetes mellitus  E11.69    E78.5     5. Hypothyroidism, unspecified type  E03.9 TSH    6. B12 deficiency  E53.8 cyanocobalamin (VITAMIN B12) injection 1,000 mcg      Meds ordered this encounter  Medications   cyanocobalamin (VITAMIN B12) injection 1,000 mcg   GVOKE HYPOPEN 2-PACK 0.5 MG/0.1ML SOAJ    Sig: Use as needed for low blood sugar- inform doctor office if you have to use    Dispense:  0.2 mL    Refill:  1    Return precautions advised.  [name], MD

## 2022-08-23 LAB — TSH: TSH: 1.34 u[IU]/mL (ref 0.35–5.50)

## 2022-08-23 MED ORDER — COVID-19 MRNA VACCINE (PFIZER) 30 MCG/0.3ML IM SUSP: 0.3000 mL | Freq: Once | INTRAMUSCULAR | 0 refills | Status: AC

## 2022-08-23 MED ORDER — RSVPREF3 VAC RECOMB ADJUVANTED 120 MCG/0.5ML IM SUSR: 0.5000 mL | Freq: Once | INTRAMUSCULAR | 0 refills | Status: AC

## 2022-08-23 MED ORDER — GVOKE HYPOPEN 2-PACK 0.5 MG/0.1ML ~~LOC~~ SOAJ: SUBCUTANEOUS | 1 refills | Status: DC

## 2022-08-24 DIAGNOSIS — Z23 Encounter for immunization: Secondary | ICD-10-CM | POA: Diagnosis not present

## 2022-08-26 ENCOUNTER — Encounter: Payer: Self-pay | Admitting: Oncology

## 2022-08-26 ENCOUNTER — Encounter: Payer: Self-pay | Admitting: Family Medicine

## 2022-08-26 LAB — PSA: PSA: 0 ng/mL — ABNORMAL LOW (ref 0.10–4.00)

## 2022-08-27 ENCOUNTER — Encounter (INDEPENDENT_AMBULATORY_CARE_PROVIDER_SITE_OTHER): Payer: Medicare Other | Admitting: Ophthalmology

## 2022-08-27 DIAGNOSIS — H35033 Hypertensive retinopathy, bilateral: Secondary | ICD-10-CM | POA: Diagnosis not present

## 2022-08-27 DIAGNOSIS — H353231 Exudative age-related macular degeneration, bilateral, with active choroidal neovascularization: Secondary | ICD-10-CM

## 2022-08-27 DIAGNOSIS — H43813 Vitreous degeneration, bilateral: Secondary | ICD-10-CM | POA: Diagnosis not present

## 2022-08-27 DIAGNOSIS — I10 Essential (primary) hypertension: Secondary | ICD-10-CM | POA: Diagnosis not present

## 2022-08-27 LAB — HM DIABETES EYE EXAM

## 2022-09-03 DIAGNOSIS — M47816 Spondylosis without myelopathy or radiculopathy, lumbar region: Secondary | ICD-10-CM | POA: Diagnosis not present

## 2022-09-11 ENCOUNTER — Ambulatory Visit (INDEPENDENT_AMBULATORY_CARE_PROVIDER_SITE_OTHER): Payer: Medicare Other | Admitting: Psychology

## 2022-09-11 DIAGNOSIS — Z23 Encounter for immunization: Secondary | ICD-10-CM | POA: Diagnosis not present

## 2022-09-11 DIAGNOSIS — F0670 Mild neurocognitive disorder due to known physiological condition without behavioral disturbance: Secondary | ICD-10-CM | POA: Diagnosis not present

## 2022-09-11 DIAGNOSIS — G20A1 Parkinson's disease without dyskinesia, without mention of fluctuations: Secondary | ICD-10-CM

## 2022-09-11 NOTE — Progress Notes (Signed)
   Neuropsychology Feedback Session [name]. [facility] of Neurology  Reason for Referral:   [name] is a 81 y.o. right-handed Caucasian male referred by Alonza Bogus, D.O., to characterize his current cognitive functioning and assist with diagnostic clarity and treatment planning in the context of ongoing consideration of deep brain stimulation (DBS) surgery for treatment of tremors related to Parkinson's disease.  Feedback:   [name] completed a comprehensive neuropsychological evaluation on 07/30/2022. Please refer to that encounter for the full report and recommendations. Briefly, results suggested an isolated impairment surrounding delayed retrieval of previously learned list-based information. However, performances across story-based recall were appropriate, as were recognition/consolidation performances across both memory tasks. Further performance variability was exhibited across complex attention and semantic fluency, while a relative weaknesses was exhibited across verbal reasoning abilities. Regarding etiology, patterns of weakness across testing align reasonably well with what is typically seen in Parkinson's disease. As such, this represents a likely cause for current testing patterns. Testing patterns would also align reasonably well with a primary vascular etiology given his medical history (i.e., hypertension, hyperlipidemia, type II diabetes, obstructive sleep apnea).   [name] was accompanied by his wife during the current feedback session. Content of the current session focused on the results of his neuropsychological evaluation. [name] was given the opportunity to ask questions and his questions were answered. He was encouraged to reach out should additional questions arise. A copy of his report was provided at the conclusion of the visit.      20 minutes were spent conducting the current feedback session with [name], billed as  one unit [phone].

## 2022-09-16 ENCOUNTER — Ambulatory Visit: Payer: Medicare Other | Admitting: Dermatology

## 2022-09-18 ENCOUNTER — Ambulatory Visit: Payer: Medicare Other

## 2022-09-19 ENCOUNTER — Inpatient Hospital Stay: Payer: Medicare Other | Attending: Oncology | Admitting: Oncology

## 2022-09-19 ENCOUNTER — Other Ambulatory Visit: Payer: Self-pay

## 2022-09-19 ENCOUNTER — Ambulatory Visit (INDEPENDENT_AMBULATORY_CARE_PROVIDER_SITE_OTHER): Payer: Medicare Other

## 2022-09-19 ENCOUNTER — Encounter: Payer: Self-pay | Admitting: Family Medicine

## 2022-09-19 VITALS — BP 157/85 | HR 94 | Temp 98.1°F | Resp 17 | Ht 72.0 in | Wt 258.7 lb

## 2022-09-19 DIAGNOSIS — E538 Deficiency of other specified B group vitamins: Secondary | ICD-10-CM

## 2022-09-19 DIAGNOSIS — C61 Malignant neoplasm of prostate: Secondary | ICD-10-CM | POA: Diagnosis not present

## 2022-09-19 DIAGNOSIS — C9110 Chronic lymphocytic leukemia of B-cell type not having achieved remission: Secondary | ICD-10-CM | POA: Diagnosis not present

## 2022-09-19 DIAGNOSIS — Z79890 Hormone replacement therapy (postmenopausal): Secondary | ICD-10-CM | POA: Diagnosis not present

## 2022-09-19 DIAGNOSIS — Z79899 Other long term (current) drug therapy: Secondary | ICD-10-CM | POA: Insufficient documentation

## 2022-09-19 DIAGNOSIS — R5383 Other fatigue: Secondary | ICD-10-CM | POA: Diagnosis not present

## 2022-09-19 DIAGNOSIS — Z7984 Long term (current) use of oral hypoglycemic drugs: Secondary | ICD-10-CM | POA: Diagnosis not present

## 2022-09-19 MED ORDER — CYANOCOBALAMIN 1000 MCG/ML IJ SOLN
1000.0000 ug | Freq: Once | INTRAMUSCULAR | Status: AC
  Administered 2022-09-19: 1000 ug via INTRAMUSCULAR

## 2022-09-19 NOTE — Progress Notes (Signed)
Hematology and Oncology Follow Up Visit  [name] [id number] August 13, 1941 81 y.o. 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  He has not required treatment and been on active surveillance.   Secondary diagnosis:   Prostate cancer diagnosed in 2015.  He was found to have stage T1c, Gleason score 3+4 = 7 PSA of 6.8.  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  Prior Therapy: Status post robotic prostatectomy in February 2015 with the pathology revealed a Gleason score 3+4 = 7 without any evidence of extraprostatic extension.   He is a status post wide local excision and right axillary sentinel lymph node sampling completed on May 31, 2021 by Dr. Barry Dienes.  Current therapy: Active surveillance.  Interim History:  [name] returns today for a follow-up.  Since the last visit, he reports no major changes in his health.  He does report some weakness and fatigue.  He denies any lymphadenopathy or constitutional symptoms.  He denies any fevers or chills or sweats.  His performance status quality of life remains unchanged.     Medications: Updated on review. Current Outpatient Medications  Medication Sig Dispense Refill   amLODipine (NORVASC) 10 MG tablet Take 10 mg by mouth daily.     azelastine (ASTELIN) 0.1 % nasal spray Place 1 spray into both nostrils 2 (two) times daily. Use in each nostril as directed 30 mL 12   busPIRone (BUSPAR) 10 MG tablet Take by mouth 3 (three) times daily.     carbidopa-levodopa (SINEMET IR) 25-100 MG tablet Take 1 tablet by mouth 3 (three) times daily. 6am/10am/2pm 90 tablet 0   carbidopa-levodopa (SINEMET IR) 25-100 MG tablet Take 1 tablet by mouth 3 (three) times daily. 270 tablet 1   Cholecalciferol 50 MCG (2000 UT) TABS Take by mouth.     empagliflozin (JARDIANCE) 25 MG TABS tablet Take 1 tablet (25 mg total)  by mouth daily before breakfast. (GIVEN [facility]) 30 tablet    fenofibrate (TRICOR) 145 MG tablet Take 145 mg by mouth daily.     fluticasone (FLONASE) 50 MCG/ACT nasal spray Place into both nostrils.     gabapentin (NEURONTIN) 300 MG capsule Take 3 caps in PM and 3 caps at bedtime and continue.     glucose 4 GM chewable tablet Chew by mouth.     glucose blood test strip Use to test 4 times daily. E11.9 100 each 12   glucose-Vitamin C 4-0.006 GM CHEW chewable tablet Chew by mouth.     GVOKE HYPOPEN 2-PACK 0.5 MG/0.1ML SOAJ Use as needed for low blood sugar- inform doctor office if you have to use 0.2 mL 1   hydrochlorothiazide (HYDRODIURIL) 25 MG tablet Take 25 mg by mouth daily.     hydrocortisone 2.5 % cream SMARTSIG:1 Topical Daily     hydrOXYzine (ATARAX) 10 MG tablet Take 10 mg by mouth 3 (three) times daily.     insulin aspart (NOVOLOG) 100 UNIT/ML injection Inject 30 Units into the skin 3 (three) times daily with meals. When sugar is elevated     insulin glargine (LANTUS) 100 UNIT/ML injection Inject 50 Units into the skin at bedtime.     lansoprazole (PREVACID) 30 MG capsule Take 1 capsule (30 mg total) by mouth daily at 12 noon. 30 capsule 5   levothyroxine (SYNTHROID, LEVOTHROID) 50 MCG tablet Take 50 mcg by mouth daily before  breakfast.     losartan (COZAAR) 100 MG tablet Take 100 mg by mouth daily.     mirtazapine (REMERON) 15 MG tablet Take 15 mg by mouth at bedtime.     ondansetron (ZOFRAN ODT) 4 MG disintegrating tablet Take 1 tablet (4 mg total) by mouth every 8 (eight) hours as needed for nausea or vomiting. 20 tablet 0   OVER THE COUNTER MEDICATION PreserVision for eyes     potassium chloride SA (K-DUR,KLOR-CON) 20 MEQ tablet Take 20 mEq by mouth daily.     simvastatin (ZOCOR) 80 MG tablet Take half tablet (40 mg) by mouth once daily     sucralfate (CARAFATE) 1 G tablet Take 1 g by mouth 4 (four) times daily -  with meals and at bedtime.     trimethoprim-polymyxin b (POLYTRIM)  ophthalmic solution Apply to eye.     zolpidem (AMBIEN) 5 MG tablet Take 5 mg by mouth at bedtime as needed for sleep.     No current facility-administered medications for this visit.     Allergies:  Allergies  Allergen Reactions   Morphine Sulfate Itching and Rash       Physical exam:   Blood pressure (!) 157/85, pulse 94, temperature 98.1 F (36.7 C), temperature source Temporal, resp. rate 17, height 6' (1.829 m), weight 258 lb 11.2 oz (117.3 kg), SpO2 96 %.    ECOG: 1   General appearance: Alert, awake without any distress. Head: Atraumatic without abnormalities Oropharynx: Without any thrush or ulcers. Eyes: No scleral icterus. Lymph nodes: No lymphadenopathy noted in the cervical, supraclavicular, or axillary nodes Heart:regular rate and rhythm, without any murmurs or gallops.   Lung: Clear to auscultation without any rhonchi, wheezes or dullness to percussion. Abdomin: Soft, nontender without any shifting dullness or ascites. Musculoskeletal: No clubbing or cyanosis. Neurological: No motor or sensory deficits. Skin: No rashes or lesions.     .   Lab Results: Lab Results  Component Value Date   WBC 35.3 Repeated and verified X2. (HH) 08/22/2022   HGB 14.6 08/22/2022   HCT 44.2 08/22/2022   MCV 86.9 08/22/2022   PLT 170.0 08/22/2022   PSA 0.00 Repeated and verified X2. (L) 08/22/2022     Chemistry      Component Value Date/Time   NA 138 08/22/2022 0917   NA 139 07/26/2021 0000   NA 139 07/26/2021 0000   NA 142 10/30/2017 0845   K 4.3 08/22/2022 0917   K 3.7 10/30/2017 0845   CL 100 08/22/2022 0917   CO2 30 08/22/2022 0917   CO2 28 10/30/2017 0845   BUN 12 08/22/2022 0917   BUN 13 09/18/2020 0000   BUN 13.3 10/30/2017 0845   CREATININE 1.13 08/22/2022 0917   CREATININE 1.08 08/26/2019 0908   CREATININE 1.2 10/30/2017 0845   GLU 133 07/26/2021 0000      Component Value Date/Time   CALCIUM 9.0 08/22/2022 0917   CALCIUM 9.5 10/30/2017 0845    ALKPHOS 60 08/22/2022 0917   ALKPHOS 53 10/30/2017 0845   AST 24 08/22/2022 0917   AST 31 08/26/2019 0908   AST 37 (H) 10/30/2017 0845   ALT 23 08/22/2022 0917   ALT 29 08/26/2019 0908   ALT 37 10/30/2017 0845   BILITOT 1.5 (H) 08/22/2022 0917   BILITOT 1.3 (H) 08/26/2019 0908   BILITOT 1.23 (H) 10/30/2017 0845         Impression and Plan:  81 year old man:    1.  CLL diagnosed in  2015 after presenting with lymphocytosis and adenopathy.   He is currently on active surveillance without any indication for treatment.  His white cell count is showing a slight increase in the last few months and definitely over the last year.  Indication for treatment were discussed at this time including painful adenopathy, constitutional symptoms or bone marrow failure.  Overall he does not have any of these indications and he is adamant against treatment in any case.  He feels that he is already weak and is hesitant to receive any treatment unless absolutely have to.   2.  Prostate cancer diagnosed in 2015.  He was found to have T1c, Gleason 7.  He continues to be without any disease relapse at this time with PSA undetectable.   3.  T2N0 Merkel cell of the forearm diagnosed in June 2022.  He is currently on active surveillance without any evidence of relapse or treatment at this time.  He has deferred any imaging studies at this time.   4. Follow-up: In 6 months for a follow-up.  30 minutes were spent on this visit.  The time was dedicated to reviewing laboratory data, disease status update and outlining future plan of care discussion.    Zola Button, MD 10/19/202312:27 PM

## 2022-09-19 NOTE — Progress Notes (Addendum)
Pt received b12 injection in left deltoid, pt tolerated injection well.

## 2022-09-23 ENCOUNTER — Encounter: Payer: Self-pay | Admitting: Family Medicine

## 2022-10-01 ENCOUNTER — Ambulatory Visit: Payer: Medicare Other | Admitting: Family Medicine

## 2022-10-01 ENCOUNTER — Encounter (INDEPENDENT_AMBULATORY_CARE_PROVIDER_SITE_OTHER): Payer: Medicare Other | Admitting: Ophthalmology

## 2022-10-01 DIAGNOSIS — H35033 Hypertensive retinopathy, bilateral: Secondary | ICD-10-CM | POA: Diagnosis not present

## 2022-10-01 DIAGNOSIS — I10 Essential (primary) hypertension: Secondary | ICD-10-CM | POA: Diagnosis not present

## 2022-10-01 DIAGNOSIS — H353231 Exudative age-related macular degeneration, bilateral, with active choroidal neovascularization: Secondary | ICD-10-CM | POA: Diagnosis not present

## 2022-10-01 DIAGNOSIS — H43813 Vitreous degeneration, bilateral: Secondary | ICD-10-CM

## 2022-10-07 ENCOUNTER — Other Ambulatory Visit: Payer: Self-pay

## 2022-10-07 NOTE — Telephone Encounter (Signed)
Changes made and documented.

## 2022-10-07 NOTE — Progress Notes (Unsigned)
Assessment/Plan:   1.  Parkinsons Disease  -Continue carbidopa/levodopa 25/100, 1 tablet 3 times per day.  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  Tremor is very bothersome to patient and he very much wants surgical intervention.   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  -He was following with Dr. Denna Hoover for dermatology.  He knows that office is closed and will need to find another dermatologist since Parkinson's slightly increases risk for melanoma.  2.  Anxiety  -Following with [facility]ry at [facility].  -On low-dose [facility]apine [facility], 7.5 mg at bed  3.  Hypertension  -On several antihypertensives including hydrochlorothiazide, losartan, amlodipine.  We will need to watch this in the future given the nature of Parkinson's disease for causing dysautonomia.  4.  Diabetes, type II, insulin-dependent  -Managed by Dr. Yong Hoover.  -This is not ideally controlled with an A1c of 8.1.  Worry about increased infection risk with DBS.  5.  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 Subjective:   [name] was seen today in follow up for Parkinsons disease, diagnosed last visit..  My previous records were reviewed prior to todays visit as well as outside records available to me.  He had neurocognitive testing with [name] since our last visit that was fairly unremarkable.  He met criteria for MCI.  He did not think that there was a separate psychiatric process.  Patient did complete home physical therapy since our last visit.  Those notes are reviewed.  Patient has been to oncology recently for CLL.  He is on active surveillance.  They noted his white count has definitely increased over the last year, and while they were not definitive that they would treat him, the patient is adamant against it because he noted he was already weak.  Current prescribed movement disorder medications: Carbidopa/levodopa 25/100, 1  tablet 3 times per day (started last visit) Mirtazapine, 7.5 mg nightly (by [facility])   ALLERGIES:   Allergies  Allergen Reactions   Morphine Sulfate Itching and Rash    CURRENT MEDICATIONS:  No outpatient medications have been marked as taking for the 10/08/22 encounter (Appointment) with [name], David Quail, DO.     Objective:   PHYSICAL EXAMINATION:    VITALS:   There were no vitals filed for this visit.   GEN:  The patient appears stated age and is in NAD. HEENT:  Normocephalic, atraumatic.  The mucous membranes are moist. The superficial temporal arteries are without ropiness or tenderness. CV:  RRR Lungs:  CTAB Neck/HEME:  There are no carotid bruits bilaterally.  Neurological examination:  Orientation: The patient is alert and oriented x3. Cranial nerves: There is good facial symmetry with significant facial hypomimia. The speech is fluent and clear. Soft palate rises symmetrically and there is no tongue deviation. Hearing is intact to conversational tone. Sensation: Sensation is intact to light touch throughout Motor: Strength is at least antigravity x4.  Movement examination: Tone: There is normal tone in the upper and lower extremities. Abnormal movements: there is RUE rest tremor, moderate, and very mild left upper extremity rest tremor, intermittent.  This is about the same as last visit. Coordination:  There is mild decremation with RAM's, with any form of RAMS, including alternating supination and pronation of the forearm, hand opening and closing, finger taps, heel taps and toe taps, r>L Gait and Station: The patient pushes off of the chair to arise. The patient's  stride length is just slightly decreased and slightly drags the R leg.  He is short stepped  I have reviewed and interpreted the following labs independently    Chemistry      Component Value Date/Time   NA 138 08/22/2022 0917   NA 139 07/26/2021 0000   NA 139 07/26/2021 0000   NA 142 10/30/2017 0845    K 4.3 08/22/2022 0917   K 3.7 10/30/2017 0845   CL 100 08/22/2022 0917   CO2 30 08/22/2022 0917   CO2 28 10/30/2017 0845   BUN 12 08/22/2022 0917   BUN 13 09/18/2020 0000   BUN 13.3 10/30/2017 0845   CREATININE 1.13 08/22/2022 0917   CREATININE 1.08 08/26/2019 0908   CREATININE 1.2 10/30/2017 0845   GLU 133 07/26/2021 0000      Component Value Date/Time   CALCIUM 9.0 08/22/2022 0917   CALCIUM 9.5 10/30/2017 0845   ALKPHOS 60 08/22/2022 0917   ALKPHOS 53 10/30/2017 0845   AST 24 08/22/2022 0917   AST 31 08/26/2019 0908   AST 37 (H) 10/30/2017 0845   ALT 23 08/22/2022 0917   ALT 29 08/26/2019 0908   ALT 37 10/30/2017 0845   BILITOT 1.5 (H) 08/22/2022 0917   BILITOT 1.3 (H) 08/26/2019 0908   BILITOT 1.23 (H) 10/30/2017 0845       Lab Results  Component Value Date   WBC 35.3 Repeated and verified X2. (HH) 08/22/2022   HGB 14.6 08/22/2022   HCT 44.2 08/22/2022   MCV 86.9 08/22/2022   PLT 170.0 08/22/2022    Lab Results  Component Value Date   TSH 1.34 08/22/2022     Total time spent on today's visit was *** minutes, including both face-to-face time and nonface-to-face time.  Time included that spent on review of records (prior notes available to me/labs/imaging if pertinent), discussing treatment and goals, answering patient's questions and coordinating care.  Cc:  David Olp, MD

## 2022-10-08 ENCOUNTER — Ambulatory Visit (INDEPENDENT_AMBULATORY_CARE_PROVIDER_SITE_OTHER): Payer: Medicare Other | Admitting: Neurology

## 2022-10-08 ENCOUNTER — Encounter: Payer: Self-pay | Admitting: Neurology

## 2022-10-08 VITALS — BP 126/84 | HR 74 | Ht 72.0 in | Wt 262.2 lb

## 2022-10-08 DIAGNOSIS — G20A1 Parkinson's disease without dyskinesia, without mention of fluctuations: Secondary | ICD-10-CM

## 2022-10-08 DIAGNOSIS — R251 Tremor, unspecified: Secondary | ICD-10-CM

## 2022-10-08 NOTE — Patient Instructions (Signed)
As we discussed, we are going to do a DaT scan.  We discussed that this is not a diagnostic scan, but will just give [place] some information on dopamine levels in the brain.  Here is some information which may be helpful to you.  Before the Exam  Please tell the nurse, nuclear imaging technician or nuclear medicine physician if you are pregnant, nursing or have reduced liver function. Please also inform us if you have an allergy or sensitivity to iodine.  The test may be completed with those who are allergic to iodine, but may require pre-medication with other medications to help avoid reactions. If you need to cancel the examination, please give [place] at least 24 hours notice.  Before your scan, stop taking these medicines for the length of time shown: Name of Drug Stop Taking  Amoxapine 4 days before  Benztropine  Cogentin 3 days before  Bupropion (Aplenzin, Budeprion, Voxra, Wellbutrin, Zyban) 48 hours before  Buspirone 15 hours before  Citalopram 24 hours before  Cocaine 6 hours before  Escitalopram 24 hours before  Methamphetamine 24 hours before  Methylphenidate (Concerta, Metadate, Methylin, Ritalin) 20 hours before  Paroxetine 24 hours before  Selegilene 48 hours before  Sertraline 3 days before    On the Day of the Exam Drink plenty of fluids and go to the bathroom frequently (and for two days after your exam) Wear loose comfortable clothing, since you will need to lie still for a period of time. Please bring a list of all medications that you are taking; name and dosage. We want to make your waiting time as pleasant as possible. Consider bringing your favorite magazine, book or music player to help you pass the time.  You do not need to stay at the imaging facility the entire time, between the initial injection and the scan itself.   Please leave your jewelry and valuables at home.  During the Exam The DaTscan once started takes approximately 30-45 minutes. However, following  injection of the DaT agent approximately 3-6 hours are required before the agent has achieved appropriate concentration in the brain.  We will inject the DaTscan through an intravenous (IV) line into your arm in the AM, usually around 8-9am, and then you will come back usually in the mid afternoon for the scan. Before the exam, you will receive a drug to allow you to protect the thyroid. For the imaging test, you will be asked to lie on a table and an imaging technologist will position your head in a headrest. A strip of tape or a flexible restraint may be placed around your head to help you to not move your head during the scan. A camera will be positioned above you and you must remain very still for about 30 minute while images are taken. The scanner will be very close to your head, but will not touch your head.

## 2022-10-10 ENCOUNTER — Encounter: Payer: Self-pay | Admitting: Neurology

## 2022-10-14 LAB — COMPREHENSIVE METABOLIC PANEL
Calcium: 8.8 (ref 8.7–10.7)
eGFR: 62

## 2022-10-14 LAB — BASIC METABOLIC PANEL
CO2: 28 — AB (ref 13–22)
Chloride: 106 (ref 99–108)
Creatinine: 1.2 (ref 0.6–1.3)
Glucose: 163
Potassium: 3.3 mEq/L — AB (ref 3.5–5.1)
Sodium: 138 (ref 137–147)

## 2022-10-14 LAB — HEMOGLOBIN A1C: Hemoglobin A1C: 7.9

## 2022-10-15 ENCOUNTER — Telehealth: Payer: Self-pay | Admitting: Neurology

## 2022-10-15 NOTE — Telephone Encounter (Signed)
Called and spoke to Mrs. Virgen and informed her that patient will need to Hold carbidopa/levodopa for next 48 hours until levodopa challenge on 10/17/22. Patients wife will let patient know and had no further questions or concerns.

## 2022-10-15 NOTE — Telephone Encounter (Signed)
Called patient and left a detailed message to Hold carbidopa/levodopa for next 48 hours until levodopa challenge. Informed of appointment date and time. Will call back to ensure patient received message.

## 2022-10-15 NOTE — Telephone Encounter (Signed)
-----   Message from [place], DO sent at 10/08/2022  2:42 PM EST ----- Hold carbidopa/levodopa for next 48 hours until levodopa challenge

## 2022-10-16 ENCOUNTER — Ambulatory Visit (INDEPENDENT_AMBULATORY_CARE_PROVIDER_SITE_OTHER): Payer: Medicare Other

## 2022-10-16 ENCOUNTER — Encounter: Payer: Self-pay | Admitting: Family Medicine

## 2022-10-16 DIAGNOSIS — E538 Deficiency of other specified B group vitamins: Secondary | ICD-10-CM

## 2022-10-16 MED ORDER — CYANOCOBALAMIN 1000 MCG/ML IJ SOLN
1000.0000 ug | Freq: Once | INTRAMUSCULAR | Status: AC
  Administered 2022-10-16: 1000 ug via INTRAMUSCULAR

## 2022-10-16 NOTE — Progress Notes (Signed)
Pt tolerated b12 injection wel.

## 2022-10-16 NOTE — Progress Notes (Unsigned)
Assessment/Plan:   1.  Parkinsons Disease  -We did levodopa challenge test today and this showed no significant benefit to levodopa.  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  -We realized during his levodopa challenge test that [facility] and not immediate release.  That being said, we did give him the IR during the levodopa challenge test.  He wants to stop it and I had no objection for right now.  - Tremor is very bothersome to patient and he very much wants surgical intervention.    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  -He was following with Dr. Denna Haggard for dermatology.  He knows that office is closed and will need to find another dermatologist since Parkinson's slightly increases risk for melanoma.  -spoke with oncology and they felt pt high risk for dbs given comorbidities but no absolute contraindication from onc standpoint  -Discussed the above again with the patient today, and he was quite adamant that he understood that his risks were high, but did not want to live with this degree of tremor.  We will send to Dr. Hulan Saas once DaT complete for consideration for focused ultrasound given tremor is biggest issue and we could get this done unilateral.  However as below, I discussed with the patient that he needs to get his A1c under better control (if in fact it is still not under great control).  He needs to make an appointment with primary care.  He was given information on focused ultrasound today.   2.  Anxiety  -Following with psychiatry at [facility].  -On low-dose mir[facility]apine by [facility], 7.5 mg at bed  3.  Hypertension  -On several antihypertensives including hydrochlorothiazide, losartan, amlodipine.  We will need to watch this in the future given the nature of Parkinson's disease for causing dysautonomia.  4.  Diabetes, type II, insulin-dependent  -Managed by Dr.  Yong Channel.  -This is not ideally controlled with an A1c of 8.1.  Worry about increased infection risk with DBS.  A1C would need to be under 7 for consideration.  Patient told me that he would try to get this under much better control.    Patient is very impatient about having to do DaTscan and the on/off test today, as he wants surgical intervention "yesterday" but he has not done his part in this.  He does state that he has been to the [place] to have his A1c retested and it was 7.9.  5.  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 Subjective:   [name] was seen today in follow up for Parkinsons disease.  He is most bothered by tremor, and very much wants surgical intervention, despite the fact that he has not been diagnosed very long.  We just put in an order for DaTscan about a week ago and that is pending.  He presents today for levodopa challenge.  Current prescribed movement disorder medications: Carbidopa/levodopa 25/100, 1 tablet 3 times per day Mirtazapine, 7.5 mg nightly (by [facility])   ALLERGIES:   Allergies  Allergen Reactions   Morphine Sulfate Itching and Rash    CURRENT MEDICATIONS:  Current Meds  Medication Sig   amLODipine (NORVASC) 10 MG tablet Take 10 mg by mouth daily.   busPIRone (BUSPAR) 10 MG tablet Take by mouth 3 (three) times daily.   carbidopa-levodopa (SINEMET IR) 25-100  MG tablet Take 1 tablet by mouth 3 (three) times daily. 6am/10am/2pm   carboxymethylcellulose 1 % ophthalmic solution Apply 1 drop to eye 3 (three) times daily.   clobetasol cream (TEMOVATE) 0.17 % Apply 1 Application topically 2 (two) times daily.   Continuous Blood Gluc Sensor (FREESTYLE LIBRE 3 SENSOR) MISC by Does not apply route.   empagliflozin (JARDIANCE) 25 MG TABS tablet Take 1 tablet (25 mg total) by mouth daily before breakfast. (GIVEN BY [facility])   fenofibrate (TRICOR) 145 MG tablet Take 160 mg by mouth daily.   fluticasone  (FLONASE) 50 MCG/ACT nasal spray Place into both nostrils.   gabapentin (NEURONTIN) 300 MG capsule Take 600 mg by mouth at bedtime.   glucose 4 GM chewable tablet Chew 1 tablet by mouth once. 15g.   glucose blood test strip Use to test 4 times daily. E11.9   hydrochlorothiazide (HYDRODIURIL) 25 MG tablet Take 25 mg by mouth daily.   hydrocortisone 2.5 % cream SMARTSIG:1 Topical Daily   hydrOXYzine (ATARAX) 10 MG tablet Take 10 mg by mouth 3 (three) times daily.   insulin aspart (NOVOLOG) 100 UNIT/ML injection Inject 30 Units into the skin 3 (three) times daily with meals. When sugar is elevated   insulin glargine (LANTUS) 100 UNIT/ML injection Inject 50 Units into the skin at bedtime.   ketoconazole (NIZORAL) 2 % shampoo Apply 1 Application topically 2 (two) times a week.   levothyroxine (SYNTHROID, LEVOTHROID) 50 MCG tablet Take 50 mcg by mouth daily before breakfast.   losartan (COZAAR) 100 MG tablet Take 100 mg by mouth daily.   mirtazapine (REMERON) 15 MG tablet Take 15 mg by mouth at bedtime.   OVER THE COUNTER MEDICATION PreserVision for eyes   pantoprazole (PROTONIX) 40 MG tablet Take 40 mg by mouth daily.   potassium chloride SA (K-DUR,KLOR-CON) 20 MEQ tablet Take 20 mEq by mouth daily.   simvastatin (ZOCOR) 80 MG tablet Take 40 mg by mouth daily at 6 PM. Take half tablet (40 mg) by mouth once daily   sucralfate (CARAFATE) 1 G tablet Take 1 g by mouth 4 (four) times daily -  with meals and at bedtime.   terbinafine (LAMISIL) 1 % cream Apply 1 Application topically 2 (two) times daily.   traMADol (ULTRAM) 50 MG tablet Take 50 mg by mouth every 6 (six) hours as needed.   trimethoprim-polymyxin b (POLYTRIM) ophthalmic solution Apply to eye.   zolpidem (AMBIEN) 5 MG tablet Take 5 mg by mouth at bedtime as needed for sleep.     Objective:   PHYSICAL EXAMINATION:    VITALS:   Vitals:   10/17/22 1001  BP: 135/82  Pulse: 93  SpO2: 96%  Weight: 261 lb 9.6 oz (118.7 kg)  Height: 6'  (1.829 m)      GEN:  The patient appears stated age and is in NAD. HEENT:  Normocephalic, atraumatic.  The mucous membranes are moist. The superficial temporal arteries are without ropiness or tenderness.   Neurological examination:  Orientation: The patient is alert and oriented x3. Cranial nerves: There is good facial symmetry with significant facial hypomimia. The speech is fluent and clear. Soft palate rises symmetrically and there is no tongue deviation. Hearing is significantly decreased to conversational tone. Sensation: Sensation is intact to light touch throughout Motor: Strength is at least antigravity x4.  Levodopa challenge done today.  UPDRS motor off score was 17.  Pt then given 300 mg of levodopa dissolved in ginger ale and waited 25mnutes to re-examine  him.  UPDRS motor on score was 14.  Details of UPDRS motor score documented on separate neurophysiologic worksheet.     Movement examination: Tone: There is normal tone in the upper and lower extremities, both before and after levodopa. Abnormal movements: there is RUE rest tremor, moderate, and very mild left upper extremity rest tremor, intermittent.  This is about the same as last visit. Coordination:  There is no decremation today. Gait and Station: The patient easily arises out of the chair without use of the hands.  He is flexed at the waist, but walks fairly well down the hall and has a negative pull test both before and after levodopa administration.  I have reviewed and interpreted the following labs independently    Chemistry      Component Value Date/Time   NA 138 08/22/2022 0917   NA 139 07/26/2021 0000   NA 139 07/26/2021 0000   NA 142 10/30/2017 0845   K 4.3 08/22/2022 0917   K 3.7 10/30/2017 0845   CL 100 08/22/2022 0917   CO2 30 08/22/2022 0917   CO2 28 10/30/2017 0845   BUN 12 08/22/2022 0917   BUN 13 09/18/2020 0000   BUN 13.3 10/30/2017 0845   CREATININE 1.13 08/22/2022 0917   CREATININE  1.08 08/26/2019 0908   CREATININE 1.2 10/30/2017 0845   GLU 133 07/26/2021 0000      Component Value Date/Time   CALCIUM 9.0 08/22/2022 0917   CALCIUM 9.5 10/30/2017 0845   ALKPHOS 60 08/22/2022 0917   ALKPHOS 53 10/30/2017 0845   AST 24 08/22/2022 0917   AST 31 08/26/2019 0908   AST 37 (H) 10/30/2017 0845   ALT 23 08/22/2022 0917   ALT 29 08/26/2019 0908   ALT 37 10/30/2017 0845   BILITOT 1.5 (H) 08/22/2022 0917   BILITOT 1.3 (H) 08/26/2019 0908   BILITOT 1.23 (H) 10/30/2017 0845       Lab Results  Component Value Date   WBC 35.3 Repeated and verified X2. (HH) 08/22/2022   HGB 14.6 08/22/2022   HCT 44.2 08/22/2022   MCV 86.9 08/22/2022   PLT 170.0 08/22/2022    Lab Results  Component Value Date   TSH 1.34 08/22/2022     Total time spent on today's visit was 45 minutes, including both face-to-face time and nonface-to-face time.  Time included that spent on review of records (prior notes available to me/labs/imaging if pertinent), discussing treatment and goals, answering patient's questions and coordinating care.  This did not include the time waiting for levodopa to kick in.  Cc:  [name], MD

## 2022-10-17 ENCOUNTER — Encounter: Payer: Self-pay | Admitting: Neurology

## 2022-10-17 ENCOUNTER — Ambulatory Visit (INDEPENDENT_AMBULATORY_CARE_PROVIDER_SITE_OTHER): Payer: Medicare Other | Admitting: Neurology

## 2022-10-17 VITALS — BP 135/82 | HR 93 | Ht 72.0 in | Wt 261.6 lb

## 2022-10-17 DIAGNOSIS — G20A1 Parkinson's disease without dyskinesia, without mention of fluctuations: Secondary | ICD-10-CM

## 2022-10-17 MED ORDER — CARBIDOPA-LEVODOPA 25-100 MG PO TABS: ORAL_TABLET | ORAL | 2 refills | Status: DC

## 2022-10-17 NOTE — Patient Instructions (Signed)
You need to follow up with [name], MD to get your labs done, particularly your HgbA1C.  Remember, we would want that under 7 ideally to do surgery.  We are working on scheduling your DaT scan.  This is done at [facility]. We discussed that this is not a diagnostic scan, but will just give [place] some information on dopamine levels in the brain.  Here is some information which may be helpful to you.  Before the Exam  Please tell the nurse, nuclear imaging technician or nuclear medicine physician if you are pregnant, nursing or have reduced liver function. Please also inform us if you have an allergy or sensitivity to iodine.  The test may be completed with those who are allergic to iodine, but may require pre-medication with other medications to help avoid reactions. If you need to cancel the examination, please give [place] at least 24 hours notice.  Before your scan, stop taking these medicines for the length of time shown: Name of Drug Stop Taking  Amoxapine 4 days before  Benztropine  Cogentin 3 days before  Bupropion (Aplenzin, Budeprion, Voxra, Wellbutrin, Zyban) 48 hours before  Buspirone 15 hours before  Citalopram 24 hours before  Cocaine 6 hours before  Escitalopram 24 hours before  Methamphetamine 24 hours before  Methylphenidate (Concerta, Metadate, Methylin, Ritalin) 20 hours before  Paroxetine 24 hours before  Selegilene 48 hours before  Sertraline 3 days before    On the Day of the Exam Drink plenty of fluids and go to the bathroom frequently (and for two days after your exam) Wear loose comfortable clothing, since you will need to lie still for a period of time. Please bring a list of all medications that you are taking; name and dosage. We want to make your waiting time as pleasant as possible. Consider bringing your favorite magazine, book or music player to help you pass the time.  You do not need to stay at the imaging facility the entire time, between the initial  injection and the scan itself.   Please leave your jewelry and valuables at home.  During the Exam The DaTscan once started takes approximately 30-45 minutes. However, following injection of the DaT agent approximately 3-6 hours are required before the agent has achieved appropriate concentration in the brain.  We will inject the DaTscan through an intravenous (IV) line into your arm in the AM, usually around 8-9am, and then you will come back usually in the mid afternoon for the scan. Before the exam, you will receive a drug to allow you to protect the thyroid. For the imaging test, you will be asked to lie on a table and an imaging technologist will position your head in a headrest. A strip of tape or a flexible restraint may be placed around your head to help you to not move your head during the scan. A camera will be positioned above you and you must remain very still for about 30 minute while images are taken. The scanner will be very close to your head, but will not touch your head.

## 2022-10-18 DIAGNOSIS — M47817 Spondylosis without myelopathy or radiculopathy, lumbosacral region: Secondary | ICD-10-CM | POA: Diagnosis not present

## 2022-10-22 ENCOUNTER — Encounter: Payer: Self-pay | Admitting: Family Medicine

## 2022-10-22 ENCOUNTER — Ambulatory Visit (INDEPENDENT_AMBULATORY_CARE_PROVIDER_SITE_OTHER): Payer: Medicare Other | Admitting: Family Medicine

## 2022-10-22 VITALS — BP 124/70 | HR 70 | Temp 98.2°F | Resp 16 | Ht 72.0 in | Wt 257.6 lb

## 2022-10-22 DIAGNOSIS — Z794 Long term (current) use of insulin: Secondary | ICD-10-CM

## 2022-10-22 DIAGNOSIS — E11319 Type 2 diabetes mellitus with unspecified diabetic retinopathy without macular edema: Secondary | ICD-10-CM

## 2022-10-22 DIAGNOSIS — E1169 Type 2 diabetes mellitus with other specified complication: Secondary | ICD-10-CM

## 2022-10-22 DIAGNOSIS — J329 Chronic sinusitis, unspecified: Secondary | ICD-10-CM

## 2022-10-22 DIAGNOSIS — I10 Essential (primary) hypertension: Secondary | ICD-10-CM | POA: Diagnosis not present

## 2022-10-22 DIAGNOSIS — E785 Hyperlipidemia, unspecified: Secondary | ICD-10-CM | POA: Diagnosis not present

## 2022-10-22 DIAGNOSIS — B9689 Other specified bacterial agents as the cause of diseases classified elsewhere: Secondary | ICD-10-CM

## 2022-10-22 MED ORDER — AMOXICILLIN-POT CLAVULANATE 875-125 MG PO TABS: 1.0000 | ORAL_TABLET | Freq: Two times a day (BID) | ORAL | 0 refills | Status: AC

## 2022-10-22 NOTE — Progress Notes (Signed)
Phone [phone] In person visit   Subjective:   [name] is a 81 y.o. year old very pleasant male patient who presents for/with See problem oriented charting Chief Complaint  Patient presents with   Diabetes    Needing to get A1C at 7 for procedure with [facility] for Parkinson's tremors     Hypertension   Past Medical History-  Patient Active Problem List   Diagnosis Date Noted   Chronic bilateral low back pain without sciatica 12/12/2015    Priority: High   Chronic lymphocytic leukemia 05/16/2014    Priority: High   History of prostate cancer 01/24/2014    Priority: High   Type 2 diabetes mellitus 09/24/2007    Priority: High   Diabetic retinopathy 09/24/2007    Priority: High   Hypothyroidism 06/22/2021    Priority: Medium    Tremor 08/11/2015    Priority: Medium    Lapband APL + HH repair 08/12/2013    Priority: Medium    Insomnia 09/21/2009    Priority: Medium    Hyperlipidemia associated with type 2 diabetes mellitus 09/24/2007    Priority: Medium    OSA on CPAP 09/24/2007    Priority: Medium    Essential hypertension 09/24/2007    Priority: Medium    Bell's palsy 05/08/2020    Priority: Low   GERD (gastroesophageal reflux disease) 12/08/2014    Priority: Low   History of colonic polyps 08/22/2008    Priority: Low   Major depressive disorder 07/30/2022   PTSD (post-traumatic stress disorder) 07/30/2022   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/2022   Macular degeneration    Parkinson's disease 12/20/2021   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     amoxicillin-clavulanate (AUGMENTIN) 875-125 MG tablet Take 1 tablet by mouth 2 (two) times daily for 7 days. 14 tablet 0   busPIRone (BUSPAR) 10 MG tablet Take by mouth 3 (three) times daily.     carboxymethylcellulose 1 % ophthalmic solution Apply 1 drop to eye 3 (three) times daily.     clobetasol cream (TEMOVATE) 4.49 % Apply 1 Application topically 2 (two) times daily.     Continuous Blood Gluc Sensor (FREESTYLE LIBRE 3 SENSOR) MISC by Does not apply route.     empagliflozin (JARDIANCE) 25 MG TABS tablet Take 1 tablet (25 mg total) by mouth daily before breakfast. (GIVEN BY [facility]) 30 tablet    fenofibrate (TRICOR) 145 MG tablet Take 160 mg by mouth daily.     fluticasone (FLONASE) 50 MCG/ACT nasal spray Place into both nostrils.     gabapentin (NEURONTIN) 300 MG capsule Take 600 mg by mouth at bedtime.     glucose 4 GM chewable tablet Chew 1 tablet by mouth once. 15g.     glucose blood test strip Use to test 4 times daily. E11.9 100 each 12   hydrochlorothiazide (HYDRODIURIL) 25 MG tablet Take 25 mg by mouth daily.     hydrocortisone 2.5 % cream SMARTSIG:1 Topical Daily     hydrOXYzine (  ATARAX) 10 MG tablet Take 10 mg by mouth 3 (three) times daily.     insulin aspart (NOVOLOG) 100 UNIT/ML injection Inject 30 Units into the skin 3 (three) times daily with meals. When sugar is elevated     insulin glargine (LANTUS) 100 UNIT/ML injection Inject 50 Units into the skin at bedtime.     ketoconazole (NIZORAL) 2 % shampoo Apply 1 Application topically 2 (two) times a week.     levothyroxine (SYNTHROID, LEVOTHROID) 50 MCG tablet Take 50 mcg by mouth daily before breakfast.     losartan (COZAAR) 100 MG tablet Take 100 mg by mouth daily.     mirtazapine (REMERON) 15 MG tablet Take 15 mg by mouth at bedtime.     OVER THE COUNTER MEDICATION PreserVision for eyes      pantoprazole (PROTONIX) 40 MG tablet Take 40 mg by mouth daily.     potassium chloride SA (K-DUR,KLOR-CON) 20 MEQ tablet Take 20 mEq by mouth daily.     simvastatin (ZOCOR) 80 MG tablet Take 40 mg by mouth daily at 6 PM. Take half tablet (40 mg) by mouth once daily     sucralfate (CARAFATE) 1 G tablet Take 1 g by mouth 4 (four) times daily -  with meals and at bedtime.     terbinafine (LAMISIL) 1 % cream Apply 1 Application topically 2 (two) times daily.     traMADol (ULTRAM) 50 MG tablet Take 50 mg by mouth every 6 (six) hours as needed.     trimethoprim-polymyxin b (POLYTRIM) ophthalmic solution Apply to eye.     zolpidem (AMBIEN) 5 MG tablet Take 5 mg by mouth at bedtime as needed for sleep.     No current facility-administered medications for this visit.     Objective:  BP 124/70   Pulse 70   Temp 98.2 F (36.8 C) (Temporal)   Resp 16   Ht 6' (1.829 m)   Wt 257 lb 9.6 oz (116.8 kg)   SpO2 94%   BMI 34.94 kg/m  Gen: NAD, resting comfortably Frontal sinus tenderness noted bilaterally, maxillary sinus tenderness on the right, nasal turbinates with clear and white discharge noted.  Pharynx largely normal CV: RRR no murmurs rubs or gallops Lungs: CTAB no crackles, wheeze, rhonchi Ext: no edema Skin: warm, dry     Assessment and Plan   # Sinus pressure S:started 2 weeks ago. Some cough. A lot of throat drainage- white typically. White or clear discharge from nose. Sinus rinse tried without relief  A/P: Bacterial sinus based on sinus pressure/discharge for over 2 weeks without significant improvement-treat with Augmentin for 7 days and follow-up if fails to improve  #Parkinson's follows with Dr. Carles Collet with particular bothersome tremor S: Medication: Sinemet in past but not helping tremor so stopped. On levodopa challenge test felt extremely nauseous afterwards -Mild cognitive impairment diagnosed 07/30/2022 by Hazle Coca, PhD A/P: I agree with Dr. Melvenia Beam concerned about putting  him under anesthesia for an elective procedure-appears pursuing potential focused ultrasound with [facility] for tremor but I also agree improving A1c important-see below   #Hypertension S: Controlled on hydrochlorothiazide 25 mg, losartan 100 mg, amlodipine 10 mg (some edema in past) -propranolol for tremor lowered HR today so he stopped this BP Readings from Last 3 Encounters:  10/22/22 124/70  10/17/22 135/82  10/08/22 126/84   A/P: Controlled. Continue current medications.Marland Kitchen   #Diabetes type 2 with ophthalmic complications- lifestyle meter and freestyle S: Compliant with Lantus 65 units--> 50 units due  to lows.  Also uses short acting insulin 35-45 units before meals.  A1c goal 7.5 or less. Working with diabetes Tourist information centre manager. Also [facility] a [facility] Also on Jardiance 25 mg  - Continuous monitor through [employer] 3 -has cut down portions and sweets and sugars far better- over last 14 days average on meter of 143 down from over 170 in 90 day period. Rarely down into 60s- 3x in last month but including 69 on one reading -cr just checked 1.19 and potassium 3.3 but then took home potassium 20 meq. GFR of 62  Lab Results  Component Value Date   HGBA1C 7.9 10/14/2022   HGBA1C 8.1 06/03/2022   HGBA1C 8.2 (H) 04/22/2022  A/P: Patient has made significant dietary changes and average blood sugar over 14 days of 143 which would likely line up with A1c of 7 or less (compared to 90-day average of over 170).  Continue current medications and lifestyle changes and recheck in 3 months-discussed importance of avoiding lows and would prefer for him to adjust his insulin per sliding scale with wife's help instead of on his own -Visits with diabetes educators found to have been helpful-encouraged him to continue to these with the [place]   #Hypothyroidism S: Compliant with levothyroxine 50 mcg Lab Results  Component Value Date   TSH 1.34 08/22/2022  A/P: Controlled. Continue current  medications.  Recommended follow up: Scheduled for February 16 Future Appointments  Date Time Provider Donald  11/04/2022  7:30 AM Hayden Pedro, MD [facility] None  11/05/2022 10:00 AM [facility] INJ 1 [facility]  11/05/2022  3:00 PM [facility]NM 2 [facility]  11/13/2022 10:15 AM [facility] NURSE [facility] PEC  01/17/2023 10:20 AM [name], MD [facility] PEC  03/20/2023  2:30 PM [facility] LAB [facility] None  03/20/2023  3:00 PM Shadad, Mathis Dad, MD [facility] None    Lab/Order associations:   ICD-10-CM   1. Type 2 diabetes mellitus with retinopathy of both eyes, with long-term current use of insulin, macular edema presence unspecified, unspecified retinopathy severity ([place])  E11.319    Z79.4     2. Essential hypertension  I10     3. Hyperlipidemia associated with type 2 diabetes mellitus  E11.69    E78.5     4. Bacterial sinusitis  J32.9    B96.89       Meds ordered this encounter  Medications   amoxicillin-clavulanate (AUGMENTIN) 875-125 MG tablet    Sig: Take 1 tablet by mouth 2 (two) times daily for 7 days.    Dispense:  14 tablet    Refill:  0    Return precautions advised.  [name], MD

## 2022-10-22 NOTE — Patient Instructions (Signed)
Augmentin for sinus infection- see [place] back if doesn't clear this  Great job on improving diabetes control- you will be due/eligible for recheck valentines day or laterj  Recommended follow up: reschedule visit on 6th to 14th or later

## 2022-10-31 ENCOUNTER — Other Ambulatory Visit: Payer: Self-pay

## 2022-10-31 ENCOUNTER — Encounter: Payer: Self-pay | Admitting: Family Medicine

## 2022-10-31 MED ORDER — AMOXICILLIN 875 MG PO TABS: 875.0000 mg | ORAL_TABLET | Freq: Two times a day (BID) | ORAL | 0 refills | Status: AC

## 2022-11-04 ENCOUNTER — Encounter (INDEPENDENT_AMBULATORY_CARE_PROVIDER_SITE_OTHER): Payer: Medicare Other | Admitting: Ophthalmology

## 2022-11-04 DIAGNOSIS — I10 Essential (primary) hypertension: Secondary | ICD-10-CM | POA: Diagnosis not present

## 2022-11-04 DIAGNOSIS — H353231 Exudative age-related macular degeneration, bilateral, with active choroidal neovascularization: Secondary | ICD-10-CM

## 2022-11-04 DIAGNOSIS — H43813 Vitreous degeneration, bilateral: Secondary | ICD-10-CM

## 2022-11-04 DIAGNOSIS — H35033 Hypertensive retinopathy, bilateral: Secondary | ICD-10-CM

## 2022-11-05 ENCOUNTER — Encounter (HOSPITAL_COMMUNITY)
Admission: RE | Admit: 2022-11-05 | Discharge: 2022-11-05 | Disposition: A | Discharge status: Home / Self Care | Payer: Medicare Other | Source: Ambulatory Visit | Attending: Neurology | Admitting: Neurology

## 2022-11-05 DIAGNOSIS — R251 Tremor, unspecified: Secondary | ICD-10-CM | POA: Diagnosis not present

## 2022-11-05 MED ORDER — POTASSIUM IODIDE (ANTIDOTE) 130 MG PO TABS
ORAL_TABLET | ORAL | Status: AC
  Filled 2022-11-05: qty 1

## 2022-11-05 MED ORDER — IOFLUPANE I 123 185 MBQ/2.5ML IV SOLN
4.5000 | Freq: Once | INTRAVENOUS | Status: AC | PRN
  Administered 2022-11-05: 4.5 via INTRAVENOUS

## 2022-11-05 MED ORDER — POTASSIUM IODIDE (ANTIDOTE) 130 MG PO TABS: 130.0000 mg | ORAL_TABLET | Freq: Once | ORAL | Status: DC

## 2022-11-06 ENCOUNTER — Other Ambulatory Visit: Payer: Self-pay

## 2022-11-06 ENCOUNTER — Encounter: Payer: Self-pay | Admitting: Family Medicine

## 2022-11-06 ENCOUNTER — Telehealth: Payer: Self-pay

## 2022-11-06 DIAGNOSIS — R251 Tremor, unspecified: Secondary | ICD-10-CM

## 2022-11-06 DIAGNOSIS — G20A1 Parkinson's disease without dyskinesia, without mention of fluctuations: Secondary | ICD-10-CM

## 2022-11-06 NOTE — Telephone Encounter (Signed)
Referral has been sent  Blane Ohara II MD, PhD  Phone [phone]  Fax [phone]

## 2022-11-08 NOTE — Telephone Encounter (Signed)
Is it okay for patient to be scheduled in a 4pm slot if he can make that time?

## 2022-11-13 ENCOUNTER — Ambulatory Visit (INDEPENDENT_AMBULATORY_CARE_PROVIDER_SITE_OTHER): Payer: Medicare Other

## 2022-11-13 ENCOUNTER — Ambulatory Visit: Payer: Medicare Other | Admitting: Family Medicine

## 2022-11-13 DIAGNOSIS — E538 Deficiency of other specified B group vitamins: Secondary | ICD-10-CM

## 2022-11-13 MED ORDER — CYANOCOBALAMIN 1000 MCG/ML IJ SOLN
1000.0000 ug | Freq: Once | INTRAMUSCULAR | Status: AC
  Administered 2022-11-13: 1000 ug via INTRAMUSCULAR

## 2022-11-13 NOTE — Progress Notes (Signed)
Pt tolerated b12 well. 

## 2022-11-17 DIAGNOSIS — R251 Tremor, unspecified: Secondary | ICD-10-CM | POA: Diagnosis not present

## 2022-11-17 DIAGNOSIS — Z01818 Encounter for other preprocedural examination: Secondary | ICD-10-CM | POA: Diagnosis not present

## 2022-11-18 DIAGNOSIS — G20A1 Parkinson's disease without dyskinesia, without mention of fluctuations: Secondary | ICD-10-CM | POA: Diagnosis not present

## 2022-11-18 DIAGNOSIS — R251 Tremor, unspecified: Secondary | ICD-10-CM | POA: Diagnosis not present

## 2022-11-19 ENCOUNTER — Telehealth: Payer: Self-pay | Admitting: Pharmacist

## 2022-11-19 NOTE — Progress Notes (Signed)
    Chronic Care Management Pharmacy Assistant   Name: [name]  MRN: [id number] DOB: 1941/05/06   Reason for Encounter: Diabetes Adherence Call    Recent office visits:  10/22/2022 OV (PCP) [name], MD; no medication changes indicated.  Recent consult visits:  10/17/2022 [facility] (Neurology) Tat, Eustace Quail, DO; no medication changes indicated.  Hospital visits:  None in previous 6 months  Medications: Outpatient Encounter Medications as of 11/19/2022  Medication Sig Note   amLODipine (NORVASC) 10 MG tablet Take 10 mg by mouth daily.    busPIRone (BUSPAR) 10 MG tablet Take by mouth 3 (three) times daily.    carboxymethylcellulose 1 % ophthalmic solution Apply 1 drop to eye 3 (three) times daily.    clobetasol cream (TEMOVATE) 1.09 % Apply 1 Application topically 2 (two) times daily.    Continuous Blood Gluc Sensor (FREESTYLE LIBRE 3 SENSOR) MISC by Does not apply route.    empagliflozin (JARDIANCE) 25 MG TABS tablet Take 1 tablet (25 mg total) by mouth daily before breakfast. (GIVEN BY [facility])    fenofibrate (TRICOR) 145 MG tablet Take 160 mg by mouth daily.    fluticasone (FLONASE) 50 MCG/ACT nasal spray Place into both nostrils.    gabapentin (NEURONTIN) 300 MG capsule Take 600 mg by mouth at bedtime. 04/30/2022: REPORTS TAKING 3 CAPSULES TID   glucose 4 GM chewable tablet Chew 1 tablet by mouth once. 15g.    glucose blood test strip Use to test 4 times daily. E11.9    hydrochlorothiazide (HYDRODIURIL) 25 MG tablet Take 25 mg by mouth daily.    hydrocortisone 2.5 % cream SMARTSIG:1 Topical Daily    hydrOXYzine (ATARAX) 10 MG tablet Take 10 mg by mouth 3 (three) times daily.    insulin aspart (NOVOLOG) 100 UNIT/ML injection Inject 30 Units into the skin 3 (three) times daily with meals. When sugar is elevated    insulin glargine (LANTUS) 100 UNIT/ML injection Inject 50 Units into the skin at bedtime.    ketoconazole (NIZORAL) 2 % shampoo Apply 1 Application topically 2  (two) times a week.    levothyroxine (SYNTHROID, LEVOTHROID) 50 MCG tablet Take 50 mcg by mouth daily before breakfast.    losartan (COZAAR) 100 MG tablet Take 100 mg by mouth daily.    mirtazapine (REMERON) 15 MG tablet Take 15 mg by mouth at bedtime.    OVER THE COUNTER MEDICATION PreserVision for eyes    pantoprazole (PROTONIX) 40 MG tablet Take 40 mg by mouth daily.    potassium chloride SA (K-DUR,KLOR-CON) 20 MEQ tablet Take 20 mEq by mouth daily.    simvastatin (ZOCOR) 80 MG tablet Take 40 mg by mouth daily at 6 PM. Take half tablet (40 mg) by mouth once daily    sucralfate (CARAFATE) 1 G tablet Take 1 g by mouth 4 (four) times daily -  with meals and at bedtime.    terbinafine (LAMISIL) 1 % cream Apply 1 Application topically 2 (two) times daily.    traMADol (ULTRAM) 50 MG tablet Take 50 mg by mouth every 6 (six) hours as needed.    trimethoprim-polymyxin b (POLYTRIM) ophthalmic solution Apply to eye.    zolpidem (AMBIEN) 5 MG tablet Take 5 mg by mouth at bedtime as needed for sleep.    No facility-administered encounter medications on file as of 11/19/2022.   **Unsuccessful attempt at reaching patient to complete this call.**     April Daiva Huge, [place] Pharmacist Assistant [phone]

## 2022-11-20 ENCOUNTER — Encounter: Payer: Self-pay | Admitting: Family Medicine

## 2022-11-20 ENCOUNTER — Ambulatory Visit (INDEPENDENT_AMBULATORY_CARE_PROVIDER_SITE_OTHER): Payer: Medicare Other | Admitting: Family Medicine

## 2022-11-20 VITALS — BP 118/79 | HR 87 | Temp 98.4°F | Ht 72.0 in | Wt 260.6 lb

## 2022-11-20 DIAGNOSIS — Z794 Long term (current) use of insulin: Secondary | ICD-10-CM

## 2022-11-20 DIAGNOSIS — C9110 Chronic lymphocytic leukemia of B-cell type not having achieved remission: Secondary | ICD-10-CM | POA: Diagnosis not present

## 2022-11-20 DIAGNOSIS — G20A1 Parkinson's disease without dyskinesia, without mention of fluctuations: Secondary | ICD-10-CM | POA: Diagnosis not present

## 2022-11-20 DIAGNOSIS — E11319 Type 2 diabetes mellitus with unspecified diabetic retinopathy without macular edema: Secondary | ICD-10-CM

## 2022-11-20 DIAGNOSIS — E039 Hypothyroidism, unspecified: Secondary | ICD-10-CM | POA: Diagnosis not present

## 2022-11-20 DIAGNOSIS — I10 Essential (primary) hypertension: Secondary | ICD-10-CM

## 2022-11-20 NOTE — Patient Instructions (Addendum)
Glad things are looking good for your procedure  Lets get those morning sugars down- try to cut oatmeal to smaller portion perhaps add sparing egg or piece of fruit or add fruit to your oatmeal for flavor (and avoid packets)  Recommended follow up: Return for next already scheduled visit or sooner if needed.

## 2022-11-20 NOTE — Progress Notes (Signed)
Phone [phone] In person visit   Subjective:   [name] is a 81 y.o. 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    Priority: High   History of prostate cancer 01/24/2014    Priority: High   Type 2 diabetes mellitus 09/24/2007    Priority: High   Diabetic retinopathy 09/24/2007    Priority: High   Hypothyroidism 06/22/2021    Priority: Medium    Tremor 08/11/2015    Priority: Medium    Lapband APL + HH repair 08/12/2013    Priority: Medium    Insomnia 09/21/2009    Priority: Medium    Hyperlipidemia associated with type 2 diabetes mellitus 09/24/2007    Priority: Medium    OSA on CPAP 09/24/2007    Priority: Medium    Essential hypertension 09/24/2007    Priority: Medium    Bell's palsy 05/08/2020    Priority: Low   GERD (gastroesophageal reflux disease) 12/08/2014    Priority: Low   History of colonic polyps 08/22/2008    Priority: Low   Major depressive disorder 07/30/2022   PTSD (post-traumatic stress disorder) 07/30/2022   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     busPIRone (BUSPAR) 10 MG tablet Take by mouth 3 (three) times daily.     carboxymethylcellulose 1 % ophthalmic solution Apply 1 drop to eye 3 (three) times daily.     clobetasol cream (TEMOVATE) 6.29 % Apply 1 Application topically 2 (two) times daily.     Continuous Blood Gluc Sensor (FREESTYLE LIBRE 3 SENSOR) MISC by Does not apply route.     empagliflozin (JARDIANCE) 25 MG TABS tablet Take 1 tablet (25 mg total) by mouth daily before breakfast. (GIVEN BY [facility]) 30 tablet    fenofibrate (TRICOR) 145 MG tablet Take 160 mg by mouth daily.     fluticasone (FLONASE) 50 MCG/ACT nasal spray Place into both nostrils.     gabapentin (NEURONTIN) 300 MG capsule Take 600 mg by mouth at bedtime.     glucose 4 GM chewable tablet Chew 1 tablet by mouth once. 15g.     glucose blood test strip Use to test 4 times daily. E11.9 100 each 12   hydrochlorothiazide (HYDRODIURIL) 25 MG tablet Take 25 mg by mouth daily.     hydrocortisone 2.5 % cream SMARTSIG:1 Topical Daily     hydrOXYzine (ATARAX) 10 MG tablet Take 10 mg by mouth 3 (three) times daily.     insulin aspart (NOVOLOG) 100 UNIT/ML injection Inject 30  Units into the skin 3 (three) times daily with meals. When sugar is elevated     insulin glargine (LANTUS) 100 UNIT/ML injection Inject 50 Units into the skin at bedtime.     ketoconazole (NIZORAL) 2 % shampoo Apply 1 Application topically 2 (two) times a week.     levothyroxine (SYNTHROID, LEVOTHROID) 50 MCG tablet Take 50 mcg by mouth daily before breakfast.     losartan (COZAAR) 100 MG tablet Take 100 mg by mouth daily.     mirtazapine (REMERON) 15 MG tablet Take 15 mg by mouth at bedtime.     OVER THE COUNTER MEDICATION PreserVision for eyes     pantoprazole (PROTONIX) 40 MG tablet Take 40 mg by mouth daily.     potassium chloride SA (K-DUR,KLOR-CON) 20 MEQ tablet Take 20 mEq by mouth daily.      simvastatin (ZOCOR) 80 MG tablet Take 40 mg by mouth daily at 6 PM. Take half tablet (40 mg) by mouth once daily     sucralfate (CARAFATE) 1 G tablet Take 1 g by mouth 4 (four) times daily -  with meals and at bedtime.     terbinafine (LAMISIL) 1 % cream Apply 1 Application topically 2 (two) times daily.     traMADol (ULTRAM) 50 MG tablet Take 50 mg by mouth every 6 (six) hours as needed.     trimethoprim-polymyxin b (POLYTRIM) ophthalmic solution Apply to eye.     zolpidem (AMBIEN) 5 MG tablet Take 5 mg by mouth at bedtime as needed for sleep.     No current facility-administered medications for this visit.     Objective:  BP 118/79   Pulse 87   Temp 98.4 F (36.9 C) (Temporal)   Ht 6' (1.829 m)   Wt 260 lb 9.6 oz (118.2 kg)   SpO2 96%   BMI 35.34 kg/m  Gen: NAD, resting comfortably CV: RRR no murmurs rubs or gallops Lungs: CTAB no crackles, wheeze, rhonchi Ext: trace edema Skin: warm, dry Neuro: prominent resting tremor right >L    Assessment and Plan   #Parkinson's follows with Dr. Carles Collet with [facility] and significant tremor S: Medication: None  -Sinemet was not helpful -Mild cognitive impairment diagnosed 07/30/2022 by Hazle Coca, PhD  For tremor pursuing with [facility]- MR-Guided Focused Ultrasound for Treatment of Tremor A/P: Patient appears moving along with the process to have the above treatment.  Reports was told could last up to 5 years based on present experience-she is very interested in moving forward.  I reviewed [facility]'s note and they did not mention an A1c requirement-previously when patient was pursuing deep brain stimulator we will try to get A1c under 7 (obviously we would still prefer this if possible)  #Hyperlipidemia S: Controlled on simvastatin 40 mg-half of 80 mg tablet  [facility] also added gemfibrozil with triglycerides 379-currently on fenofibrate Lab Results  Component Value Date   CHOL 109 06/03/2022   HDL 38 06/03/2022   LDLCALC 11 03/29/2021   LDLDIRECT 55.0  04/22/2022   TRIG 442 (A) 06/03/2022   CHOLHDL 3 04/22/2022  A/P: Reasonably well-controlled-continue current medication-needs to work on controlling triglycerides with healthy lifestyle changes   #Hypertension S: Controlled on hydrochlorothiazide 25 mg, losartan 100 mg, amlodipine 10 mg (some edema in past) -propranolol for tremor lowered HR today so he stopped this BP Readings from Last 3 Encounters:  11/20/22 118/79  10/22/22 124/70  10/17/22 135/82  A/P:  stable- continue current medicines   #Diabetes type 2 with ophthalmic complications-  lifestyle meter and freestyle S: Compliant with Lantus 65 units--> 50 units.  Also uses short acting insulin 35-40 units before meals (45 if over 200).  A1c goal 7.5 or less. Also on Jardiance 25 mg Last 30 day average of 168, in particular between 6 am and 12 pm up to 188 in last 14 days avg 170 with 6 ma to 12 pm of 193 and  7 day average of 174 with 6 am to 12 pm 188. No lows in last 30 days -was eating special K with strawberries but changing to cheerios and oatmeal but does 4 packs.  -encouraged to cut down on candy Lab Results  Component Value Date   HGBA1C 7.9 10/14/2022   HGBA1C 8.1 06/03/2022   HGBA1C 8.2 (H) 04/22/2022   A/P: Diabetes appears to be improving last month with 14-day average down to 143 but has jumped back up with 7-day average of 174.  Main spike appears to be after breakfast-discussed with diabetes education referral but patient declines.  He appears to be getting over 120 g of carbs with breakfast and strongly encouraged significant reduction to approximately 60 -For now continue current medication and focus on dietary changes   #Hypothyroidism S: Compliant with levothyroxine 50 mcg Lab Results  Component Value Date   TSH 1.34 08/22/2022    A/P: Stable- continue current medicines   %  Patient with known CLL-was following with Dr. Alen Blew. He has decided to discontinue follow up with oncology- would not be interested  in therapy .  He wants to focus on quality of life Lab Results  Component Value Date   WBC 35.3 Repeated and verified X2. ([place]) 08/22/2022   HGB 14.6 08/22/2022   HCT 44.2 08/22/2022   MCV 86.9 08/22/2022   PLT 170.0 08/22/2022   Recommended follow up: Return for next already scheduled visit or sooner if needed. Future Appointments  Date Time Provider [place]  12/11/2022  9:30 AM [facility] NURSE [facility]  12/30/2022  7:30 AM Hayden Pedro, MD [facility] None  01/17/2023 10:20 AM [name], MD [facility]  03/20/2023  2:30 PM [facility] LAB [facility] None  03/20/2023  3:00 PM Shadad, Mathis Dad, MD [facility] None    Lab/Order associations:   ICD-10-CM   1. Type 2 diabetes mellitus with retinopathy of both eyes, with long-term current use of insulin, macular edema presence unspecified, unspecified retinopathy severity ([place])  E11.319    Z79.4     2. Chronic lymphocytic leukemia  C91.10     3. Parkinson's disease without dyskinesia, unspecified whether manifestations fluctuate  G20.A1     4. Hypothyroidism, unspecified type  E03.9     5. Essential hypertension  I10       No orders of the defined types were placed in this encounter.   Return precautions advised.  [name], MD

## 2022-12-05 DIAGNOSIS — Z5181 Encounter for therapeutic drug level monitoring: Secondary | ICD-10-CM | POA: Diagnosis not present

## 2022-12-05 DIAGNOSIS — G894 Chronic pain syndrome: Secondary | ICD-10-CM | POA: Diagnosis not present

## 2022-12-05 DIAGNOSIS — M47816 Spondylosis without myelopathy or radiculopathy, lumbar region: Secondary | ICD-10-CM | POA: Diagnosis not present

## 2022-12-05 DIAGNOSIS — Z79899 Other long term (current) drug therapy: Secondary | ICD-10-CM | POA: Diagnosis not present

## 2022-12-11 ENCOUNTER — Ambulatory Visit (INDEPENDENT_AMBULATORY_CARE_PROVIDER_SITE_OTHER): Payer: Medicare Other | Admitting: *Deleted

## 2022-12-11 DIAGNOSIS — E538 Deficiency of other specified B group vitamins: Secondary | ICD-10-CM | POA: Diagnosis not present

## 2022-12-11 MED ORDER — CYANOCOBALAMIN 1000 MCG/ML IJ SOLN
1000.0000 ug | Freq: Once | INTRAMUSCULAR | Status: AC
  Administered 2022-12-11: 1000 ug via INTRAMUSCULAR

## 2022-12-11 NOTE — Progress Notes (Signed)
Patient present for B12 inj  Given on left deltoid  Patient tolerated well

## 2022-12-12 ENCOUNTER — Encounter: Payer: Self-pay | Admitting: Oncology

## 2022-12-12 ENCOUNTER — Encounter: Payer: Self-pay | Admitting: Family Medicine

## 2022-12-12 DIAGNOSIS — G20A1 Parkinson's disease without dyskinesia, without mention of fluctuations: Secondary | ICD-10-CM | POA: Diagnosis not present

## 2022-12-12 DIAGNOSIS — R2689 Other abnormalities of gait and mobility: Secondary | ICD-10-CM | POA: Diagnosis not present

## 2022-12-12 DIAGNOSIS — Z79899 Other long term (current) drug therapy: Secondary | ICD-10-CM | POA: Diagnosis not present

## 2022-12-12 DIAGNOSIS — G250 Essential tremor: Secondary | ICD-10-CM | POA: Diagnosis not present

## 2022-12-12 DIAGNOSIS — R251 Tremor, unspecified: Secondary | ICD-10-CM | POA: Diagnosis not present

## 2022-12-12 DIAGNOSIS — Z01818 Encounter for other preprocedural examination: Secondary | ICD-10-CM | POA: Diagnosis not present

## 2022-12-12 DIAGNOSIS — I10 Essential (primary) hypertension: Secondary | ICD-10-CM | POA: Diagnosis not present

## 2022-12-17 ENCOUNTER — Telehealth: Payer: Self-pay | Admitting: Hematology

## 2022-12-17 NOTE — Telephone Encounter (Signed)
Called patient regarding providers departure, patient is notified. Rescheduled patient with new provider.

## 2022-12-18 DIAGNOSIS — G250 Essential tremor: Secondary | ICD-10-CM | POA: Diagnosis not present

## 2022-12-18 DIAGNOSIS — G20A1 Parkinson's disease without dyskinesia, without mention of fluctuations: Secondary | ICD-10-CM | POA: Diagnosis not present

## 2022-12-20 DIAGNOSIS — D1801 Hemangioma of skin and subcutaneous tissue: Secondary | ICD-10-CM | POA: Diagnosis not present

## 2022-12-20 DIAGNOSIS — D225 Melanocytic nevi of trunk: Secondary | ICD-10-CM | POA: Diagnosis not present

## 2022-12-20 DIAGNOSIS — L821 Other seborrheic keratosis: Secondary | ICD-10-CM | POA: Diagnosis not present

## 2022-12-20 DIAGNOSIS — Z85828 Personal history of other malignant neoplasm of skin: Secondary | ICD-10-CM | POA: Diagnosis not present

## 2022-12-23 DIAGNOSIS — R269 Unspecified abnormalities of gait and mobility: Secondary | ICD-10-CM | POA: Diagnosis not present

## 2022-12-23 DIAGNOSIS — H3530 Unspecified macular degeneration: Secondary | ICD-10-CM | POA: Diagnosis not present

## 2022-12-23 DIAGNOSIS — G20A1 Parkinson's disease without dyskinesia, without mention of fluctuations: Secondary | ICD-10-CM | POA: Diagnosis not present

## 2022-12-25 DIAGNOSIS — G20A1 Parkinson's disease without dyskinesia, without mention of fluctuations: Secondary | ICD-10-CM | POA: Diagnosis not present

## 2022-12-25 DIAGNOSIS — H3530 Unspecified macular degeneration: Secondary | ICD-10-CM | POA: Diagnosis not present

## 2022-12-25 DIAGNOSIS — Z6838 Body mass index (BMI) 38.0-38.9, adult: Secondary | ICD-10-CM | POA: Diagnosis not present

## 2022-12-25 DIAGNOSIS — Z794 Long term (current) use of insulin: Secondary | ICD-10-CM | POA: Diagnosis not present

## 2022-12-25 DIAGNOSIS — R269 Unspecified abnormalities of gait and mobility: Secondary | ICD-10-CM | POA: Diagnosis not present

## 2022-12-25 DIAGNOSIS — E669 Obesity, unspecified: Secondary | ICD-10-CM | POA: Diagnosis not present

## 2022-12-25 DIAGNOSIS — Z7984 Long term (current) use of oral hypoglycemic drugs: Secondary | ICD-10-CM | POA: Diagnosis not present

## 2022-12-25 DIAGNOSIS — E119 Type 2 diabetes mellitus without complications: Secondary | ICD-10-CM | POA: Diagnosis not present

## 2022-12-26 ENCOUNTER — Encounter: Payer: Self-pay | Admitting: Family Medicine

## 2022-12-27 DIAGNOSIS — Z794 Long term (current) use of insulin: Secondary | ICD-10-CM | POA: Diagnosis not present

## 2022-12-27 DIAGNOSIS — G20A1 Parkinson's disease without dyskinesia, without mention of fluctuations: Secondary | ICD-10-CM | POA: Diagnosis not present

## 2022-12-27 DIAGNOSIS — H3530 Unspecified macular degeneration: Secondary | ICD-10-CM | POA: Diagnosis not present

## 2022-12-27 DIAGNOSIS — Z7984 Long term (current) use of oral hypoglycemic drugs: Secondary | ICD-10-CM | POA: Diagnosis not present

## 2022-12-27 DIAGNOSIS — R269 Unspecified abnormalities of gait and mobility: Secondary | ICD-10-CM | POA: Diagnosis not present

## 2022-12-27 DIAGNOSIS — E119 Type 2 diabetes mellitus without complications: Secondary | ICD-10-CM | POA: Diagnosis not present

## 2022-12-30 ENCOUNTER — Encounter (INDEPENDENT_AMBULATORY_CARE_PROVIDER_SITE_OTHER): Payer: Medicare Other | Admitting: Ophthalmology

## 2022-12-30 DIAGNOSIS — I10 Essential (primary) hypertension: Secondary | ICD-10-CM | POA: Diagnosis not present

## 2022-12-30 DIAGNOSIS — H35033 Hypertensive retinopathy, bilateral: Secondary | ICD-10-CM | POA: Diagnosis not present

## 2022-12-30 DIAGNOSIS — H43813 Vitreous degeneration, bilateral: Secondary | ICD-10-CM

## 2022-12-30 DIAGNOSIS — H353231 Exudative age-related macular degeneration, bilateral, with active choroidal neovascularization: Secondary | ICD-10-CM

## 2022-12-31 ENCOUNTER — Encounter (INDEPENDENT_AMBULATORY_CARE_PROVIDER_SITE_OTHER): Payer: Medicare Other | Admitting: Ophthalmology

## 2022-12-31 DIAGNOSIS — R269 Unspecified abnormalities of gait and mobility: Secondary | ICD-10-CM | POA: Diagnosis not present

## 2022-12-31 DIAGNOSIS — Z7984 Long term (current) use of oral hypoglycemic drugs: Secondary | ICD-10-CM | POA: Diagnosis not present

## 2022-12-31 DIAGNOSIS — G20A1 Parkinson's disease without dyskinesia, without mention of fluctuations: Secondary | ICD-10-CM | POA: Diagnosis not present

## 2022-12-31 DIAGNOSIS — Z794 Long term (current) use of insulin: Secondary | ICD-10-CM | POA: Diagnosis not present

## 2022-12-31 DIAGNOSIS — E119 Type 2 diabetes mellitus without complications: Secondary | ICD-10-CM | POA: Diagnosis not present

## 2022-12-31 DIAGNOSIS — H3530 Unspecified macular degeneration: Secondary | ICD-10-CM | POA: Diagnosis not present

## 2023-01-07 ENCOUNTER — Ambulatory Visit (INDEPENDENT_AMBULATORY_CARE_PROVIDER_SITE_OTHER): Payer: Medicare Other

## 2023-01-07 ENCOUNTER — Ambulatory Visit: Payer: Medicare Other | Admitting: Family Medicine

## 2023-01-07 DIAGNOSIS — E538 Deficiency of other specified B group vitamins: Secondary | ICD-10-CM | POA: Diagnosis not present

## 2023-01-07 MED ORDER — CYANOCOBALAMIN 1000 MCG/ML IJ SOLN
1000.0000 ug | Freq: Once | INTRAMUSCULAR | Status: AC
  Administered 2023-01-07: 1000 ug via INTRAMUSCULAR

## 2023-01-07 NOTE — Progress Notes (Signed)
[name] 82 yr old male presents to office today for B12 injections per [name], MD. Administered CYANOCOBALAMIN 1,000 mcg IM left arm. Patient tolerated well. Aware to return next month bor next injection

## 2023-01-17 ENCOUNTER — Ambulatory Visit: Payer: Medicare Other | Admitting: Family Medicine

## 2023-02-01 ENCOUNTER — Encounter: Payer: Self-pay | Admitting: Family Medicine

## 2023-02-18 ENCOUNTER — Ambulatory Visit (INDEPENDENT_AMBULATORY_CARE_PROVIDER_SITE_OTHER): Payer: Medicare Other

## 2023-02-18 DIAGNOSIS — E538 Deficiency of other specified B group vitamins: Secondary | ICD-10-CM

## 2023-02-18 MED ORDER — CYANOCOBALAMIN 1000 MCG/ML IJ SOLN
1000.0000 ug | Freq: Once | INTRAMUSCULAR | Status: AC
  Administered 2023-02-18: 1000 ug via INTRAMUSCULAR

## 2023-02-18 NOTE — Progress Notes (Signed)
[name] 82 yr old male presents for monthly B12 injection per [name], MD. Administered CYANOCOBALAMIN 1,000 mcg IM left arm. Patient tolerated well.

## 2023-02-19 ENCOUNTER — Encounter: Payer: Self-pay | Admitting: Family Medicine

## 2023-02-19 ENCOUNTER — Telehealth: Payer: Self-pay | Admitting: Pharmacist

## 2023-02-19 DIAGNOSIS — J029 Acute pharyngitis, unspecified: Secondary | ICD-10-CM | POA: Diagnosis not present

## 2023-02-19 DIAGNOSIS — H1032 Unspecified acute conjunctivitis, left eye: Secondary | ICD-10-CM | POA: Diagnosis not present

## 2023-02-19 DIAGNOSIS — R051 Acute cough: Secondary | ICD-10-CM | POA: Diagnosis not present

## 2023-02-19 NOTE — Progress Notes (Signed)
Care Management & Coordination Services Pharmacy Team  Reason for Encounter: General adherence update   Contacted patient for general health update and medication adherence call.  Spoke with  Spouse  on 02/19/2023    What concerns do you have about your medications? none  The patient denies side effects with their medications.   How often do you forget or accidentally miss a dose? Rarely  Do you use a pillbox? Yes  Are you having any problems getting your medications from your pharmacy? No  Has the cost of your medications been a concern? No If yes, what medication and is patient assistance available or has it been applied for?  Since last visit with PharmD, no interventions have been made.   The patient has not had an ED visit since last contact.   The patient denies problems with their health.   Patient reports the following concerns or questions for Leata Mouse, PharmD at this time. Patients wife states she is currently managing the patient's medications. She states it can be stressful at times. She has scheduled an appt with Leata Mouse, PharmD 04/01/23 at 11 am.  Counseled patient on: Access to carecoordination team for any cost, medication or pharmacy concerns.   Chart Updates:  Recent office visits:  11/20/2022 OV (PCP) [name], MD; no medication changes indicated.  Recent consult visits:  12/23/2022 [facility] (Neurology) Oretha Milch, [place]; no medication changes indicated.  12/05/2022 OV (Pain Med) Lockie Pares, NP; no medication changes indicated  Hospital visits:  None in previous 6 months  Medications: Outpatient Encounter Medications as of 02/19/2023  Medication Sig Note   amLODipine (NORVASC) 10 MG tablet Take 10 mg by mouth daily.    busPIRone (BUSPAR) 10 MG tablet Take by mouth 3 (three) times daily.    carboxymethylcellulose 1 % ophthalmic solution Apply 1 drop to eye 3 (three) times daily.    clobetasol cream (TEMOVATE) 6.12 % Apply 1  Application topically 2 (two) times daily.    Continuous Blood Gluc Sensor (FREESTYLE LIBRE 3 SENSOR) MISC by Does not apply route.    empagliflozin (JARDIANCE) 25 MG TABS tablet Take 1 tablet (25 mg total) by mouth daily before breakfast. (GIVEN BY [facility])    fenofibrate (TRICOR) 145 MG tablet Take 160 mg by mouth daily.    fluticasone (FLONASE) 50 MCG/ACT nasal spray Place into both nostrils.    gabapentin (NEURONTIN) 300 MG capsule Take 600 mg by mouth at bedtime. 04/30/2022: REPORTS TAKING 3 CAPSULES TID   glucose 4 GM chewable tablet Chew 1 tablet by mouth once. 15g.    glucose blood test strip Use to test 4 times daily. E11.9    hydrochlorothiazide (HYDRODIURIL) 25 MG tablet Take 25 mg by mouth daily.    hydrocortisone 2.5 % cream SMARTSIG:1 Topical Daily    hydrOXYzine (ATARAX) 10 MG tablet Take 10 mg by mouth 3 (three) times daily.    insulin aspart (NOVOLOG) 100 UNIT/ML injection Inject 30 Units into the skin 3 (three) times daily with meals. When sugar is elevated    insulin glargine (LANTUS) 100 UNIT/ML injection Inject 50 Units into the skin at bedtime.    ketoconazole (NIZORAL) 2 % shampoo Apply 1 Application topically 2 (two) times a week.    levothyroxine (SYNTHROID, LEVOTHROID) 50 MCG tablet Take 50 mcg by mouth daily before breakfast.    losartan (COZAAR) 100 MG tablet Take 100 mg by mouth daily.    mirtazapine (REMERON) 15 MG tablet Take 15 mg by mouth  at bedtime.    OVER THE COUNTER MEDICATION PreserVision for eyes    pantoprazole (PROTONIX) 40 MG tablet Take 40 mg by mouth daily.    potassium chloride SA (K-DUR,KLOR-CON) 20 MEQ tablet Take 20 mEq by mouth daily.    simvastatin (ZOCOR) 80 MG tablet Take 40 mg by mouth daily at 6 PM. Take half tablet (40 mg) by mouth once daily    sucralfate (CARAFATE) 1 G tablet Take 1 g by mouth 4 (four) times daily -  with meals and at bedtime.    terbinafine (LAMISIL) 1 % cream Apply 1 Application topically 2 (two) times daily.    traMADol  (ULTRAM) 50 MG tablet Take 50 mg by mouth every 6 (six) hours as needed.    trimethoprim-polymyxin b (POLYTRIM) ophthalmic solution Apply to eye.    zolpidem (AMBIEN) 5 MG tablet Take 5 mg by mouth at bedtime as needed for sleep.    No facility-administered encounter medications on file as of 02/19/2023.    Recent vitals BP Readings from Last 3 Encounters:  11/20/22 118/79  10/22/22 124/70  10/17/22 135/82   Pulse Readings from Last 3 Encounters:  11/20/22 87  10/22/22 70  10/17/22 93   Wt Readings from Last 3 Encounters:  11/20/22 260 lb 9.6 oz (118.2 kg)  10/22/22 257 lb 9.6 oz (116.8 kg)  10/17/22 261 lb 9.6 oz (118.7 kg)   BMI Readings from Last 3 Encounters:  11/20/22 35.34 kg/m  10/22/22 34.94 kg/m  10/17/22 35.48 kg/m    Recent lab results    Component Value Date/Time   NA 138 10/14/2022 0000   NA 142 10/30/2017 0845   K 3.3 (A) 10/14/2022 0000   K 3.7 10/30/2017 0845   CL 106 10/14/2022 0000   CO2 28 (A) 10/14/2022 0000   CO2 28 10/30/2017 0845   GLUCOSE 304 (H) 08/22/2022 0917   GLUCOSE 186 (H) 10/30/2017 0845   GLUCOSE 218 (H) 12/08/2006 1053   BUN 12 08/22/2022 0917   BUN 13 09/18/2020 0000   BUN 13.3 10/30/2017 0845   CREATININE 1.2 10/14/2022 0000   CREATININE 1.13 08/22/2022 0917   CREATININE 1.08 08/26/2019 0908   CREATININE 1.2 10/30/2017 0845   CALCIUM 8.8 10/14/2022 0000   CALCIUM 9.5 10/30/2017 0845    Lab Results  Component Value Date   CREATININE 1.2 10/14/2022   GFR 62.03 08/22/2022   EGFR 62 10/14/2022   GFRNONAA >60 05/29/2021   GFRAA >60 08/26/2019   Lab Results  Component Value Date/Time   HGBA1C 7.9 10/14/2022 12:00 AM   HGBA1C 8.1 06/03/2022 12:00 AM   MICROALBUR <0.7 08/22/2022 09:17 AM   MICROALBUR 0.681 03/29/2021 12:00 AM    Lab Results  Component Value Date   CHOL 109 06/03/2022   HDL 38 06/03/2022   LDLCALC 11 03/29/2021   LDLDIRECT 55.0 04/22/2022   TRIG 442 (A) 06/03/2022   CHOLHDL 3 04/22/2022    Care  Gaps: Annual wellness visit in last year? No  If Diabetic: Last eye exam / retinopathy screening: 08/27/2022 Last diabetic foot exam: 08/22/2022 Last UACR: 08/22/2022  Star Rating Drugs:  Novolog 100u/mL last filled 10/16/2022 90 DS   Future Appointments  Date Time Provider [place]  02/24/2023  7:35 AM Hayden Pedro, MD [facility] None  03/18/2023  9:15 AM [facility] NURSE [facility]  03/20/2023 12:45 PM [facility] LAB [facility] None  03/20/2023  1:20 PM Truitt Merle, MD [facility] None  04/15/2023  8:20 AM [name], MD  [facility]   April D Calhoun, [place] Pharmacist Assistant [phone]

## 2023-02-24 ENCOUNTER — Encounter (INDEPENDENT_AMBULATORY_CARE_PROVIDER_SITE_OTHER): Payer: Medicare Other | Admitting: Ophthalmology

## 2023-02-26 ENCOUNTER — Encounter (INDEPENDENT_AMBULATORY_CARE_PROVIDER_SITE_OTHER): Payer: Medicare Other | Admitting: Ophthalmology

## 2023-02-26 ENCOUNTER — Encounter (INDEPENDENT_AMBULATORY_CARE_PROVIDER_SITE_OTHER): Payer: Self-pay

## 2023-02-26 DIAGNOSIS — H04123 Dry eye syndrome of bilateral lacrimal glands: Secondary | ICD-10-CM | POA: Diagnosis not present

## 2023-02-26 DIAGNOSIS — H1045 Other chronic allergic conjunctivitis: Secondary | ICD-10-CM | POA: Diagnosis not present

## 2023-02-26 LAB — HM DIABETES EYE EXAM

## 2023-03-03 DIAGNOSIS — H04123 Dry eye syndrome of bilateral lacrimal glands: Secondary | ICD-10-CM | POA: Diagnosis not present

## 2023-03-03 DIAGNOSIS — H353231 Exudative age-related macular degeneration, bilateral, with active choroidal neovascularization: Secondary | ICD-10-CM | POA: Diagnosis not present

## 2023-03-03 DIAGNOSIS — H1045 Other chronic allergic conjunctivitis: Secondary | ICD-10-CM | POA: Diagnosis not present

## 2023-03-07 DIAGNOSIS — H1045 Other chronic allergic conjunctivitis: Secondary | ICD-10-CM | POA: Diagnosis not present

## 2023-03-07 DIAGNOSIS — H04123 Dry eye syndrome of bilateral lacrimal glands: Secondary | ICD-10-CM | POA: Diagnosis not present

## 2023-03-17 ENCOUNTER — Encounter (INDEPENDENT_AMBULATORY_CARE_PROVIDER_SITE_OTHER): Payer: Medicare Other | Admitting: Ophthalmology

## 2023-03-17 DIAGNOSIS — H35372 Puckering of macula, left eye: Secondary | ICD-10-CM

## 2023-03-17 DIAGNOSIS — H43813 Vitreous degeneration, bilateral: Secondary | ICD-10-CM

## 2023-03-17 DIAGNOSIS — H35033 Hypertensive retinopathy, bilateral: Secondary | ICD-10-CM

## 2023-03-17 DIAGNOSIS — H353231 Exudative age-related macular degeneration, bilateral, with active choroidal neovascularization: Secondary | ICD-10-CM | POA: Diagnosis not present

## 2023-03-17 DIAGNOSIS — I10 Essential (primary) hypertension: Secondary | ICD-10-CM | POA: Diagnosis not present

## 2023-03-18 ENCOUNTER — Ambulatory Visit (INDEPENDENT_AMBULATORY_CARE_PROVIDER_SITE_OTHER): Payer: Medicare Other

## 2023-03-18 DIAGNOSIS — E538 Deficiency of other specified B group vitamins: Secondary | ICD-10-CM | POA: Diagnosis not present

## 2023-03-18 MED ORDER — CYANOCOBALAMIN 1000 MCG/ML IJ SOLN
1000.0000 ug | Freq: Once | INTRAMUSCULAR | Status: AC
  Administered 2023-03-18: 1000 ug via INTRAMUSCULAR

## 2023-03-18 NOTE — Progress Notes (Signed)
[name] 82 yr old male  presents to office today for monthly B12 injection per Tana Conch, MD. Administered CYANOCOBALAMIN 1,000 mcg IM left arm. Patient tolerated well.

## 2023-03-19 ENCOUNTER — Other Ambulatory Visit: Payer: Self-pay | Admitting: *Deleted

## 2023-03-19 DIAGNOSIS — C9110 Chronic lymphocytic leukemia of B-cell type not having achieved remission: Secondary | ICD-10-CM

## 2023-03-20 ENCOUNTER — Inpatient Hospital Stay: Payer: Medicare Other | Attending: Hematology

## 2023-03-20 ENCOUNTER — Encounter: Payer: Self-pay | Admitting: Hematology

## 2023-03-20 ENCOUNTER — Inpatient Hospital Stay (HOSPITAL_BASED_OUTPATIENT_CLINIC_OR_DEPARTMENT_OTHER): Payer: Medicare Other | Admitting: Hematology

## 2023-03-20 ENCOUNTER — Ambulatory Visit: Payer: Medicare Other | Admitting: Oncology

## 2023-03-20 ENCOUNTER — Other Ambulatory Visit: Payer: Medicare Other

## 2023-03-20 VITALS — BP 169/74 | HR 98 | Temp 98.9°F | Resp 18 | Ht 72.0 in | Wt 252.8 lb

## 2023-03-20 DIAGNOSIS — Z8546 Personal history of malignant neoplasm of prostate: Secondary | ICD-10-CM | POA: Insufficient documentation

## 2023-03-20 DIAGNOSIS — C9110 Chronic lymphocytic leukemia of B-cell type not having achieved remission: Secondary | ICD-10-CM | POA: Diagnosis not present

## 2023-03-20 DIAGNOSIS — C4A61 Merkel cell carcinoma of right upper limb, including shoulder: Secondary | ICD-10-CM

## 2023-03-20 DIAGNOSIS — Z9079 Acquired absence of other genital organ(s): Secondary | ICD-10-CM | POA: Diagnosis not present

## 2023-03-20 LAB — CBC WITH DIFFERENTIAL (CANCER CENTER ONLY)
Abs Immature Granulocytes: 0.12 10*3/uL — ABNORMAL HIGH (ref 0.00–0.07)
Basophils Absolute: 0.2 10*3/uL — ABNORMAL HIGH (ref 0.0–0.1)
Basophils Relative: 1 %
Eosinophils Absolute: 0.4 10*3/uL (ref 0.0–0.5)
Eosinophils Relative: 1 %
HCT: 41.6 % (ref 39.0–52.0)
Hemoglobin: 14.4 g/dL (ref 13.0–17.0)
Immature Granulocytes: 0 %
Lymphocytes Relative: 67 %
Lymphs Abs: 23.2 10*3/uL — ABNORMAL HIGH (ref 0.7–4.0)
MCH: 29.1 pg (ref 26.0–34.0)
MCHC: 34.6 g/dL (ref 30.0–36.0)
MCV: 84 fL (ref 80.0–100.0)
Monocytes Absolute: 4.5 10*3/uL — ABNORMAL HIGH (ref 0.1–1.0)
Monocytes Relative: 13 %
Neutro Abs: 6.3 10*3/uL (ref 1.7–7.7)
Neutrophils Relative %: 18 %
Platelet Count: 204 10*3/uL (ref 150–400)
RBC: 4.95 MIL/uL (ref 4.22–5.81)
RDW: 14.9 % (ref 11.5–15.5)
Smear Review: NORMAL
WBC Count: 34.6 10*3/uL — ABNORMAL HIGH (ref 4.0–10.5)
nRBC: 0 % (ref 0.0–0.2)

## 2023-03-20 LAB — COMPREHENSIVE METABOLIC PANEL
ALT: 20 U/L (ref 0–44)
AST: 20 U/L (ref 15–41)
Albumin: 4.2 g/dL (ref 3.5–5.0)
Alkaline Phosphatase: 58 U/L (ref 38–126)
Anion gap: 7 (ref 5–15)
BUN: 11 mg/dL (ref 8–23)
CO2: 29 mmol/L (ref 22–32)
Calcium: 9.2 mg/dL (ref 8.9–10.3)
Chloride: 106 mmol/L (ref 98–111)
Creatinine, Ser: 0.98 mg/dL (ref 0.61–1.24)
GFR, Estimated: >60 mL/min (ref >60)
Glucose, Bld: 121 mg/dL — ABNORMAL HIGH (ref 70–99)
Potassium: 3.5 mmol/L (ref 3.5–5.1)
Sodium: 142 mmol/L (ref 135–145)
Total Bilirubin: 1 mg/dL (ref 0.3–1.2)
Total Protein: 6.5 g/dL (ref 6.5–8.1)

## 2023-03-20 NOTE — Assessment & Plan Note (Signed)
-  T2 N0, diagnosed in June 2022 -No active surveillance without evidence of disease recurrence.

## 2023-03-20 NOTE — Assessment & Plan Note (Signed)
-  Prostate cancer diagnosed in 2015. He was found to have stage T1c, Gleason score 3+4 = 7 PSA of 6.8.  -Status post robotic prostatectomy in February 2015 with the pathology revealed a Gleason score 3+4 = 7 without any evidence of extraprostatic extension.  -Under active surveillance, no evidence of recurrence so far.  PSA has been undetectable.

## 2023-03-20 NOTE — Assessment & Plan Note (Signed)
-  Diagnosed in 2015 after presenting with lymphocytosis and adenopathy -Stage 0, on active surveillance, has not required any treatment.

## 2023-03-20 NOTE — Progress Notes (Signed)
[facility]   [phone] [phone]   Clinic Follow up Note   Patient Care Team: Shelva Majestic, MD as PCP - General (Family Medicine) Sherrie George, MD as Consulting Physician (Ophthalmology) Janalyn Harder, MD (Inactive) as Consulting Physician (Dermatology) Erroll Luna, [facility] (Pharmacist)  Date of Service:  03/20/2023  CHIEF COMPLAINT: f/u of CLL  CURRENT THERAPY:  Active surveillance    ASSESSMENT:  [name] is a 82 y.o. male with   History of prostate cancer -Prostate cancer diagnosed in 2015. He was found to have stage T1c, Gleason score 3+4 = 7 PSA of 6.8.  -Status post robotic prostatectomy in February 2015 with the pathology revealed a Gleason score 3+4 = 7 without any evidence of extraprostatic extension.  -Under active surveillance, no evidence of recurrence so far.  PSA has been undetectable.  Chronic lymphocytic leukemia -Diagnosed in 2015 after presenting with lymphocytosis and adenopathy -Stage 0, on active surveillance, has not required any treatment.  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    PLAN -lab reviewed-WBC stable -PSA -pending -Lab and f./u in 6 months, he will f/u with urologist once a year    INTERVAL HISTORY:  [name] is here for a follow up of CLL. He was last seen by Dr. Clelia Croft on 09/19/2022. He presents to the clinic accompanied by wife. Pt state that he has no  major issues. Pt had skin cancer on his  RT arm. Pt has Parkinson.    All other systems were reviewed with the patient and are negative.  MEDICAL HISTORY:  Past Medical History:  Diagnosis Date   Arthritis    Basal cell carcinoma 03/18/2017   left neck CX3 5FU   Bell's palsy 05/08/2020   Cataract    Chronic bilateral low back pain without sciatica 12/12/2015   Chronic lymphocytic leukemia 05/16/2014    Oncology q6 months. Thought to be agent orange related-CLL and prostate cancer  Diagnosis 1. 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 - NO EVIDENCE OF EXTRAPROSTATIC EXTENSION, ANGIOLYMPHATIC INVASION OR SEMINAL VESICLE INVOLVEMENT IDENTIFIED. - RESECTION MARGINS, NEGATIVE FOR ATYPIA OR MALIGNANCY. 2. Lymph nodes, regional resection, righ   Chronic pain syndrome 02/01/2016   Chronic right hip pain 09/27/2019   DDD (degenerative disc disease), lumbar    Diabetic retinopathy 09/24/2007   Diverticulosis of colon    Elevated PSA    Erectile dysfunction 12/30/2007   No rx.      GERD (gastroesophageal reflux disease)    Gynecomastia 12/28/2020   History of colonic polyps 08/22/2008   2004 3 adenomas 2009 none 2013 none 02/26/2017 8 mm sigmoid polyp was inflammatory - no recall planned given age/co-morbidities overall hx of polyps     History of prostate cancer 01/24/2014   Follows with Dr. Wilson Singer every 3 months. Dr. Clelia Croft every 6 months.  Diagnosis 1. 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 - NO EVIDENCE OF EXTRAPROSTATIC EXTENSION, ANGIOLYMPHATIC INVASION OR SEMINAL VESICLE INVOLVEMENT IDENTIFIED. - RESECTION MARGINS, NEGATIVE FOR ATYPIA OR MALIGNANCY. 2. Lymph nodes, regional resection, right pelvic - SIX   Hyperlipidemia associated with type 2 diabetes mellitus 09/24/2007   High triglycerides. Refused statin due to myalgias in past but then started half tablet of simvastatin 40mg  through [place] of this note might be different from the original. Formatting of this  note might be different from the original. High triglycerides. Refused statin due to myalgias in past but then started half tablet of simvastatin 40mg  through [place]  Last Assessment & Plan:  Formatting    Hypertension associated with diabetes 09/24/2007   Amlodipine 10mg , losartan 100mg , HCTZ 25mg , propranolol 40 mg  twice a day Home cuff 142/85 compared to 134/72 on my check. Likely home cuff about 10 points higher.   --> chlorthalidone 03/20/16 but patient didn't change and then BP controlled on next visit   Formatting of this note might be different from the original. Formatting of this note might be different from the original. 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  Follow up in 6 months unless wound issues arise.  Derm follow up in October 2022 scheduled.  I will see him back in 6 months.   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 a1c under 8     Formatting of this note might be different from the original. Formatting of this note might be different from the original. And macular degeneration.   Last Assessment & Plan:  Formatting of this note might be different from the original. S: diabetic retinopathy followed up by optho yesterday and largely stable. He also has m   Vitamin D deficiency 11/01/2020   At [facility]- on 2000 units a day at least since 2021     SURGICAL HISTORY: Past Surgical History:  Procedure Laterality Date   CHOLECYSTECTOMY     COLONOSCOPY  03/25/12, 09/28/08    ESOPHAGOGASTRODUODENOSCOPY N/A 10/12/2014   Procedure: ESOPHAGOGASTRODUODENOSCOPY (EGD);  Surgeon: Iva Boop, MD;  Location: [facility];  Service: Endoscopy;  Laterality: N/A;   ESOPHAGOGASTRODUODENOSCOPY ENDOSCOPY  09/28/08   EXCISION MELANOMA WITH SENTINEL LYMPH NODE BIOPSY Right 05/31/2021   Procedure: WIDE LOCAL EXCISION RIGHT FOREARM WITH ADVANCEDMENT FLAP CLOSURE WITH SENTINEL LYMPH NODE MAPPING AND BIOSPY;  Surgeon: Almond Lint, MD;  Location: [facility];  Service: General;  Laterality: Right;   FOOT SURGERY Bilateral    HEMORRHOID SURGERY     LAPAROSCOPIC GASTRIC BANDING  03/05/11   weight loss   LAPAROSCOPIC GASTRIC BANDING WITH HIATAL HERNIA REPAIR  03/05/2011   LYMPHADENECTOMY Bilateral 01/24/2014   Procedure: LYMPHADENECTOMY;  Surgeon: Crecencio Mc, MD;  Location: [facility];  Service: Urology;  Laterality: Bilateral;   ORIF TIBIA FRACTURE Right    PENILE PROSTHESIS IMPLANT     PROSTATE SURGERY  01/2014   ROBOT ASSISTED LAPAROSCOPIC RADICAL PROSTATECTOMY N/A 01/24/2014   Procedure: ROBOTIC ASSISTED LAPAROSCOPIC RADICAL PROSTATECTOMY LEVEL 3;  Surgeon: Crecencio Mc, MD;  Location: [facility];  Service: Urology;  Laterality: N/A;  SHOULDER SURGERY Right 2011   TONSILLECTOMY     age 39   VASECTOMY      I have reviewed the social history and family history with the patient and they are unchanged from previous note.  ALLERGIES:  is allergic to morphine sulfate.  MEDICATIONS:  Current Outpatient Medications  Medication Sig Dispense Refill   amLODipine (NORVASC) 10 MG tablet Take 10 mg by mouth daily.     busPIRone (BUSPAR) 10 MG tablet Take by mouth 3 (three) times daily.     carboxymethylcellulose 1 % ophthalmic solution Apply 1 drop to eye 3 (three) times daily.     clobetasol cream (TEMOVATE) 0.05 % Apply 1 Application topically 2 (two) times daily.     Continuous Blood Gluc Sensor (FREESTYLE LIBRE 3 SENSOR) MISC by Does not apply route.     empagliflozin (JARDIANCE)  25 MG TABS tablet Take 1 tablet (25 mg total) by mouth daily before breakfast. (GIVEN BY [facility]) 30 tablet    fenofibrate (TRICOR) 145 MG tablet Take 160 mg by mouth daily.     fluticasone (FLONASE) 50 MCG/ACT nasal spray Place into both nostrils.     gabapentin (NEURONTIN) 300 MG capsule Take 600 mg by mouth at bedtime.     glucose 4 GM chewable tablet Chew 1 tablet by mouth once. 15g.     glucose blood test strip Use to test 4 times daily. E11.9 100 each 12   hydrochlorothiazide (HYDRODIURIL) 25 MG tablet Take 25 mg by mouth daily.     hydrocortisone 2.5 % cream SMARTSIG:1 Topical Daily     hydrOXYzine (ATARAX) 10 MG tablet Take 10 mg by mouth 3 (three) times daily.     insulin aspart (NOVOLOG) 100 UNIT/ML injection Inject 30 Units into the skin 3 (three) times daily with meals. When sugar is elevated     insulin glargine (LANTUS) 100 UNIT/ML injection Inject 50 Units into the skin at bedtime.     ketoconazole (NIZORAL) 2 % shampoo Apply 1 Application topically 2 (two) times a week.     levothyroxine (SYNTHROID, LEVOTHROID) 50 MCG tablet Take 50 mcg by mouth daily before breakfast.     losartan (COZAAR) 100 MG tablet Take 100 mg by mouth daily.     mirtazapine (REMERON) 15 MG tablet Take 15 mg by mouth at bedtime.     OVER THE COUNTER MEDICATION PreserVision for eyes     pantoprazole (PROTONIX) 40 MG tablet Take 40 mg by mouth daily.     potassium chloride SA (K-DUR,KLOR-CON) 20 MEQ tablet Take 20 mEq by mouth daily.     simvastatin (ZOCOR) 80 MG tablet Take 40 mg by mouth daily at 6 PM. Take half tablet (40 mg) by mouth once daily     sucralfate (CARAFATE) 1 G tablet Take 1 g by mouth 4 (four) times daily -  with meals and at bedtime.     terbinafine (LAMISIL) 1 % cream Apply 1 Application topically 2 (two) times daily.     traMADol (ULTRAM) 50 MG tablet Take 50 mg by mouth every 6 (six) hours as needed.     trimethoprim-polymyxin b (POLYTRIM) ophthalmic solution Apply to eye.     zolpidem  (AMBIEN) 5 MG tablet Take 5 mg by mouth at bedtime as needed for sleep.     No current facility-administered medications for this visit.    PHYSICAL EXAMINATION: ECOG PERFORMANCE STATUS: 2 - Symptomatic, <50% confined to bed  Vitals:   03/20/23 1240  BP: (!) 169/74  Pulse: 98  Resp: 18  Temp: 98.9 F (37.2 C)  SpO2: 97%   Wt Readings from Last 3 Encounters:  03/20/23 252 lb 12.8 oz (114.7 kg)  11/20/22 260 lb 9.6 oz (118.2 kg)  10/22/22 257 lb 9.6 oz (116.8 kg)     GENERAL:alert, no distress and comfortable SKIN: skin color, texture, turgor are normal, no rashes or significant lesions EYES: normal, Conjunctiva are pink and non-injected, sclera clear LUNGS: (-) clear to auscultation and percussion with normal breathing effort HEART: (-) regular rate & rhythm and no murmurs and (-) no lower extremity edema ABDOMEN:(-) abdomen soft, (-) non-tender and normal bowel sounds    LABORATORY DATA:  I have reviewed the data as listed    Latest Ref Rng & Units 03/20/2023   12:15 PM 08/22/2022    9:17 AM 04/22/2022    8:59 AM  CBC  WBC 4.0 - 10.5 K/uL 34.6  35.3 Repeated and verified X2.  29.3 Repeated and verified X2.   Hemoglobin 13.0 - 17.0 g/dL 29.5  28.4  13.2   Hematocrit 39.0 - 52.0 % 41.6  44.2  42.9   Platelets 150 - 400 K/uL 204  170.0  172.0         Latest Ref Rng & Units 03/20/2023   12:15 PM 10/14/2022   12:00 AM 08/22/2022    9:17 AM  CMP  Glucose 70 - 99 mg/dL 440   102   BUN 8 - 23 mg/dL 11   12   Creatinine 7.25 - 1.24 mg/dL 3.66  1.2     4.40   Sodium 135 - 145 mmol/L 142  138     138   Potassium 3.5 - 5.1 mmol/L 3.5  3.3     4.3   Chloride 98 - 111 mmol/L 106  106     100   CO2 22 - 32 mmol/L Calcium 8.9 - 10.3 mg/dL 9.2  8.8     9.0   Total Protein 6.5 - 8.1 g/dL 6.5   6.3   Total Bilirubin 0.3 - 1.2 mg/dL 1.0   1.5   Alkaline Phos 38 - 126 U/L 58   60   AST 15 - 41 U/L 20   24   ALT 0 - 44 U/L 20   23      This result is from an  external source.      RADIOGRAPHIC STUDIES: I have personally reviewed the radiological images as listed and agreed with the findings in the report. No results found.    Orders Placed This Encounter  Procedures   Comprehensive metabolic panel    Standing Status:   Standing    Number of Occurrences:   50    Standing Expiration Date:   03/19/2024   Prostate-Specific AG, Serum    Standing Status:   Standing    Number of Occurrences:   10    Standing Expiration Date:   03/19/2024   All questions were answered. The patient knows to call the clinic with any problems, questions or concerns. No barriers to learning was detected. The total time spent in the appointment was 30 minutes.     Malachy Mood, MD 03/20/2023   Carolin Coy, CMA, am acting as scribe for Malachy Mood, MD.   I have reviewed the above documentation for accuracy and completeness, and I agree with the above.

## 2023-03-22 LAB — PROSTATE-SPECIFIC AG, SERUM (LABCORP): Prostate Specific Ag, Serum: <0.1 ng/mL (ref 0.0–4.0)

## 2023-03-24 DIAGNOSIS — G20A1 Parkinson's disease without dyskinesia, without mention of fluctuations: Secondary | ICD-10-CM | POA: Diagnosis not present

## 2023-03-24 DIAGNOSIS — H3530 Unspecified macular degeneration: Secondary | ICD-10-CM | POA: Diagnosis not present

## 2023-03-24 DIAGNOSIS — R269 Unspecified abnormalities of gait and mobility: Secondary | ICD-10-CM | POA: Diagnosis not present

## 2023-03-26 LAB — BASIC METABOLIC PANEL
BUN: 13 (ref 4–21)
CO2: 27 — AB (ref 13–22)
Chloride: 109 — AB (ref 99–108)
Creatinine: 1 (ref 0.6–1.3)
Glucose: 70
Potassium: 3.5 mEq/L (ref 3.5–5.1)
Sodium: 139 (ref 137–147)

## 2023-03-26 LAB — LIPID PANEL
Cholesterol: 124 (ref 0–200)
HDL: 48 (ref 35–70)
LDL Cholesterol: 32
Triglycerides: 221 — AB (ref 40–160)

## 2023-03-26 LAB — COMPREHENSIVE METABOLIC PANEL
Calcium: 9 (ref 8.7–10.7)
eGFR: 80

## 2023-03-26 LAB — HEMOGLOBIN A1C: Hemoglobin A1C: 6.9

## 2023-03-31 ENCOUNTER — Other Ambulatory Visit: Payer: Self-pay

## 2023-03-31 ENCOUNTER — Ambulatory Visit: Payer: Medicare Other | Attending: Physician Assistant

## 2023-03-31 ENCOUNTER — Telehealth: Payer: Self-pay | Admitting: Pharmacist

## 2023-03-31 DIAGNOSIS — R2689 Other abnormalities of gait and mobility: Secondary | ICD-10-CM

## 2023-03-31 NOTE — Therapy (Signed)
OUTPATIENT PHYSICAL THERAPY NEURO EVALUATION   Patient Name: [name] MRN: [id number] DOB:07-05-1941, 82 y.o., male Today's Date: 03/31/2023  REFERRING PROVIDER: Sharlyne Cai, PA  END OF SESSION:  PT End of Session - 03/31/23 1303     Visit Number 1    Number of Visits 4    Date for PT Re-Evaluation 05/30/23    PT Start Time 1305    PT Stop Time 1325    PT Time Calculation (min) 20 min    Activity Tolerance Patient tolerated treatment well    Behavior During Therapy [facility] for tasks assessed/performed             Past Medical History:  Diagnosis Date   Arthritis    Basal cell carcinoma 03/18/2017   left neck CX3 5FU   Bell's palsy 05/08/2020   Cataract    Chronic bilateral low back pain without sciatica 12/12/2015   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 Thought to be agent orange related-CLL and prostate cancer  Diagnosis 1. 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 - NO EVIDENCE OF EXTRAPROSTATIC EXTENSION, ANGIOLYMPHATIC INVASION OR SEMINAL VESICLE INVOLVEMENT IDENTIFIED. - RESECTION MARGINS, NEGATIVE FOR ATYPIA OR MALIGNANCY. 2. Lymph nodes, regional resection, righ   Chronic pain syndrome 02/01/2016   Chronic right hip pain 09/27/2019   DDD (degenerative disc disease), lumbar    Diabetic retinopathy 09/24/2007   Diverticulosis of colon    Elevated PSA    Erectile dysfunction 12/30/2007   No rx.      GERD (gastroesophageal reflux disease)    Gynecomastia 12/28/2020   History of colonic polyps 08/22/2008   2004 3 adenomas 2009 none 2013 none 02/26/2017 8 mm sigmoid polyp was inflammatory - no recall planned given age/co-morbidities overall hx of polyps     History of prostate cancer 01/24/2014   Follows with Dr. Wilson Singer every 3 months. Dr. Clelia Croft every 6 months.  Diagnosis 1. 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 -  NO EVIDENCE OF EXTRAPROSTATIC EXTENSION, ANGIOLYMPHATIC INVASION OR SEMINAL VESICLE INVOLVEMENT IDENTIFIED. - RESECTION MARGINS, NEGATIVE FOR ATYPIA OR MALIGNANCY. 2. Lymph nodes, regional resection, right pelvic - SIX   Hyperlipidemia associated with type 2 diabetes mellitus 09/24/2007   High triglycerides. Refused statin due to myalgias in past but then started half tablet of simvastatin 40mg  through [facility] of this note might be different from the original. Formatting of this note might be different from the original. High triglycerides. Refused statin due to myalgias in past but then started half tablet of simvastatin 40mg  through [place]  Last Assessment & Plan:  Formatting    Hypertension associated with diabetes 09/24/2007   Amlodipine 10mg , losartan 100mg , HCTZ 25mg , propranolol 40 mg twice a day Home cuff 142/85 compared to 134/72 on my check. Likely home cuff about 10 points higher.   --> chlorthalidone 03/20/16 but patient didn't change and then BP controlled on next visit   Formatting of this note might be different from the original. Formatting of this note might be different from the original. 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  Follow up in 6 months unless wound issues arise.  Derm follow up in October 2022 scheduled.  I will see him back in 6 months.   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 a1c under 8     Formatting of this note might be different from the original. Formatting of this note might be different from the original. And macular degeneration.   Last Assessment & Plan:  Formatting of this note might be different from the original. S: diabetic retinopathy followed up by optho yesterday and largely stable. He also has m   Vitamin D deficiency 11/01/2020   At [facility]- on 2000 units a day at least since 2021    Past Surgical History:  Procedure Laterality Date   CHOLECYSTECTOMY     COLONOSCOPY  03/25/12, 09/28/08   ESOPHAGOGASTRODUODENOSCOPY N/A 10/12/2014   Procedure: ESOPHAGOGASTRODUODENOSCOPY (EGD);  Surgeon: Iva Boop, MD;  Location: [facility];  Service: Endoscopy;  Laterality: N/A;   ESOPHAGOGASTRODUODENOSCOPY ENDOSCOPY  09/28/08   EXCISION MELANOMA WITH SENTINEL LYMPH NODE BIOPSY Right 05/31/2021   Procedure: WIDE LOCAL EXCISION RIGHT FOREARM WITH ADVANCEDMENT FLAP CLOSURE WITH SENTINEL LYMPH NODE MAPPING AND BIOSPY;  Surgeon: Almond Lint, MD;  Location: [facility];  Service: General;  Laterality: Right;   FOOT SURGERY Bilateral    HEMORRHOID SURGERY     LAPAROSCOPIC GASTRIC BANDING  03/05/11   weight loss   LAPAROSCOPIC GASTRIC BANDING WITH HIATAL HERNIA REPAIR  03/05/2011   LYMPHADENECTOMY Bilateral 01/24/2014   Procedure: LYMPHADENECTOMY;  Surgeon: Crecencio Mc, MD;  Location: [facility];  Service: Urology;  Laterality: Bilateral;   ORIF TIBIA FRACTURE Right    PENILE PROSTHESIS IMPLANT     PROSTATE SURGERY  01/2014   ROBOT ASSISTED LAPAROSCOPIC RADICAL PROSTATECTOMY N/A 01/24/2014   Procedure: ROBOTIC ASSISTED LAPAROSCOPIC RADICAL PROSTATECTOMY LEVEL 3;  Surgeon: Crecencio Mc, MD;  Location: [facility];  Service: Urology;  Laterality: N/A;    SHOULDER SURGERY Right 2011   TONSILLECTOMY     age 8   VASECTOMY     Patient Active Problem List   Diagnosis Date Noted   Major depressive disorder 07/30/2022   PTSD (post-traumatic stress disorder) 07/30/2022   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/2022   Macular degeneration    Parkinson's disease 12/20/2021   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 (HCC) 06/25/2021   Hypothyroidism 06/22/2021   Gynecomastia 12/28/2020   Vitamin D deficiency 11/01/2020   Bell's palsy 05/08/2020   Senile purpura 05/02/2020   Chronic right hip pain 09/27/2019   Sacroiliac joint dysfunction 06/29/2018   Temporomandibular joint (TMJ) pain 04/14/2017   Chronic pain syndrome 02/01/2016   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 myelopathy or radiculopathy 02/01/2016   Chronic bilateral low back pain without sciatica 12/12/2015   Tremor 08/11/2015   GERD (gastroesophageal reflux disease) 12/08/2014   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   History of prostate cancer 01/24/2014   Lapband APL + HH  repair 08/12/2013   Insomnia 09/21/2009   History of colonic polyps 08/22/2008   Type 2 diabetes mellitus 09/24/2007   Diabetic retinopathy 09/24/2007   Hyperlipidemia associated with type 2 diabetes mellitus 09/24/2007   OSA on CPAP 09/24/2007   Essential hypertension 09/24/2007    ONSET DATE: chronic   REFERRING DIAG: Parkinson's disease, unspecified whether dyskinesia present, unspecified whether manifestations fluctuate; macular degeneration of both eyes, unspecified type; gait disturbance  THERAPY DIAG:  Other abnormalities of gait and mobility  Rationale for Evaluation and Treatment: Rehabilitation  SUBJECTIVE:                                                                                                                                                                                             SUBJECTIVE STATEMENT: Patient reports that he has been having  trouble getting around recently. He was told that he needed to try physical therapy before he can get a brain surgery. However, he "does not like therapy." He notes that he has not fallen or lost his balance any recently. He has to take multiple breaks while he does activities such as washing dishes and cleaning.  Pt accompanied by: self  PERTINENT HISTORY: Hypertension, diabetes, diabetic retinopathy, Parkinson's disease, low back pain, history of cancer, depression, and PTSD  PAIN:  Are you having pain? No  PRECAUTIONS: None  WEIGHT BEARING RESTRICTIONS: No  FALLS: Has patient fallen in last 6 months? No  LIVING ENVIRONMENT: Lives with: lives with their spouse Lives in: House/apartment Stairs: Yes: External: 4 steps; on left going up; step to pattern Has following equipment at home: None  PLOF: Independent with basic ADLs  PATIENT GOALS: be able to have his surgery  OBJECTIVE:   COGNITION: Overall cognitive status: Within functional limits for tasks assessed   SENSATION: Patient reports no numbness or tingling.   EDEMA:  No edema observed  POSTURE: rounded shoulders, forward head, and increased thoracic kyphosis  LOWER EXTREMITY ROM: WFL for activities assessed  LOWER EXTREMITY MMT:    MMT Right Eval Left Eval  Hip flexion 4/5 4/5  Hip extension    Hip abduction    Hip adduction    Hip internal rotation    Hip external rotation    Knee flexion 4+/5 4+/5  Knee extension 5/5 5/5  Ankle dorsiflexion 4/5 4/5  Ankle plantarflexion    Ankle inversion    Ankle eversion    (Blank rows = not tested)  TRANSFERS: Assistive device utilized: None  Sit to stand: SBA Stand to sit: SBA  GAIT: Gait pattern: shuffling Distance walked: 20 feet Assistive device utilized: None Level of  assistance: SBA  FUNCTIONAL TESTS:  5 times sit to stand: 31.79 seconds and required upper extremity support from armrests  Timed up and go (TUG): 22.61 seconds  TODAY'S TREATMENT:                                                                                                                               DATE:     PATIENT EDUCATION: Education details: POC, prognosis, and goals for therapy Person educated: Patient Education method: Explanation Education comprehension: verbalized understanding  HOME EXERCISE PROGRAM:   GOALS: Goals reviewed with patient? Yes  LONG TERM GOALS: Target date: 04/28/23  Patient will be independent with his HEP.  Baseline:  Goal status: INITIAL  2.  Patient will improve his five time sit to stand time to 15 seconds or less for improved lower extremity power.  Baseline:  Goal status: INITIAL  3.  Patient will improve his timed up and go time to 15 seconds or less for improved safety.  Baseline:  Goal status: INITIAL  4.  Patient will be able to independently transfer from sitting to standing without upper extremity support for improved household independence. Baseline:  Goal status: INITIAL  ASSESSMENT:  CLINICAL IMPRESSION: Patient is a 82 y.o. male who was seen today for physical therapy evaluation and treatment for instability and gait deviations secondary to Parkinson's Disease.  He is at a elevated fall risk as evidenced by his objective measures and his gait deviations.  However, his prognosis is limited as he does not want to attend therapy more than necessary in order to get his surgery authorized.  Recommend that he continue with skilled physical therapy to address his impairments to maximize his safety and mobility.  OBJECTIVE IMPAIRMENTS: Abnormal gait, decreased activity tolerance, decreased balance, decreased mobility, difficulty walking, decreased strength, and postural dysfunction.   ACTIVITY LIMITATIONS: lifting, bending, standing, stairs, transfers, and locomotion level  PARTICIPATION LIMITATIONS: meal prep, cleaning, laundry, shopping, and community activity  PERSONAL FACTORS: Age, Behavior pattern, Time  since onset of injury/illness/exacerbation, and 3+ comorbidities: Hypertension, diabetes, diabetic retinopathy, Parkinson's disease, low back pain, history of cancer, depression, and PTSD  are also affecting patient's functional outcome.   REHAB POTENTIAL: Fair    CLINICAL DECISION MAKING: Evolving/moderate complexity  EVALUATION COMPLEXITY: Moderate  PLAN:  PT FREQUENCY: 1x/week; patient requested to come to therapy as little as possible  PT DURATION: 4 weeks  PLANNED INTERVENTIONS: Therapeutic exercises, Therapeutic activity, Neuromuscular re-education, Balance training, Gait training, Patient/Family education, Self Care, Stair training, and Re-evaluation  PLAN FOR NEXT SESSION: Nustep, lower extremity strengthening, and balance interventions   Granville Lewis, PT 03/31/2023, 4:11 PM

## 2023-03-31 NOTE — Progress Notes (Signed)
Care Management & Coordination Services Pharmacy Team  Reason for Encounter: Appointment Reminder  Contacted patient to confirm telephone appointment with Erskine Emery, PharmD on 04/01/2023 at 11 am. Unsuccessful outreach. Left voicemail with appointment details.   Star Rating Drugs:  [facility]   Care Gaps: Annual wellness visit in last year? No  If Diabetic: Last eye exam / retinopathy screening: 02/26/2023 Last diabetic foot exam: 08/22/2022   Future Appointments  Date Time Provider Department Center  04/01/2023 11:00 AM Erroll Luna, [facility] None  04/07/2023  1:00 PM Granville Lewis, [place] [facility]  04/15/2023  8:20 AM Shelva Majestic, MD [facility]  05/01/2023  7:40 AM Sherrie George, MD [facility] None  09/18/2023 12:45 PM [facility] None  09/18/2023  1:20 PM Malachy Mood, MD [facility] None   April D Calhoun, [facility] Clinical Pharmacist Assistant [phone]

## 2023-04-01 ENCOUNTER — Ambulatory Visit: Payer: Medicare Other | Admitting: Pharmacist

## 2023-04-01 NOTE — Progress Notes (Signed)
Care Management & Coordination Services Pharmacy Note  04/01/2023 Name:  [name] MRN:  [id number] DOB:  November 08, 1941  Summary: PharmD FU visit on glucose.  We discussed recent dietary choices:  Patient regularly eating 4 packets of instant oatmeal plus brown sugar for breakfast.  Also eating on the go at [employer] a fair amount.  Discussed the impact of carbs on his blood sugar.  Most recent A1c at [facility] visit was 7.9%  - has upcoming Fu with PCP.    Recommendations/Changes made from today's visit: Try to cut down on oatmeal to 3 packets plus a protein source (sausage, bacon, eggs) and limit additional sugars added.  If you notice lower glucose we can lower insulin vs eating more to compensate.  Follow up plan: FU with me in 3 months   Subjective: [name] is an 82 y.o. year old male who is a primary patient of Durene Cal, Aldine Contes, MD.  The care coordination team was consulted for assistance with disease management and care coordination needs.    Engaged with patient by telephone for follow up visit.  Recent office visits:  11/20/2022 OV (PCP) Shelva Majestic, MD; no medication changes indicated.   Recent consult visits:  12/23/2022 OV (Neurology) Sharlyne Cai, [place]; no medication changes indicated.   12/05/2022 OV (Pain Med) Morene Antu, NP; no medication changes indicated   Hospital visits:  None in previous 6 months   Objective:  Lab Results  Component Value Date   CREATININE 0.98 03/20/2023   BUN 11 03/20/2023   GFR 62.03 08/22/2022   EGFR 62 10/14/2022   GFRNONAA >60 03/20/2023   GFRAA >60 08/26/2019   NA 142 03/20/2023   K 3.5 03/20/2023   CALCIUM 9.2 03/20/2023   CO2 29 03/20/2023   GLUCOSE 121 (H) 03/20/2023    Lab Results  Component Value Date/Time   HGBA1C 7.9 10/14/2022 12:00 AM   HGBA1C 8.1 06/03/2022 12:00 AM   GFR 62.03 08/22/2022 09:17 AM   GFR 57.85 (L) 04/22/2022 08:59 AM   MICROALBUR <0.7 08/22/2022 09:17 AM   MICROALBUR 0.681  03/29/2021 12:00 AM    Last diabetic Eye exam:  Lab Results  Component Value Date/Time   HMDIABEYEEXA Retinopathy (A) 02/26/2023 08:10 AM    Last diabetic Foot exam:  Lab Results  Component Value Date/Time   HMDIABFOOTEX done 11/16/2013 12:00 AM     Lab Results  Component Value Date   CHOL 109 06/03/2022   HDL 38 06/03/2022   LDLCALC 11 03/29/2021   LDLDIRECT 55.0 04/22/2022   TRIG 442 (A) 06/03/2022   CHOLHDL 3 04/22/2022       Latest Ref Rng & Units 03/20/2023   12:15 PM 08/22/2022    9:17 AM 04/22/2022    8:59 AM  Hepatic Function  Total Protein 6.5 - 8.1 g/dL 6.5  6.3  6.4   Albumin 3.5 - 5.0 g/dL 4.2  4.2  4.4   AST 15 - 41 U/L 20  24  41   ALT 0 - 44 U/L 20  23  27    Alk Phosphatase 38 - 126 U/L 58  60  56   Total Bilirubin 0.3 - 1.2 mg/dL 1.0  1.5  1.4     Lab Results  Component Value Date/Time   TSH 1.34 08/22/2022 09:17 AM   TSH 1.27 04/22/2022 08:59 AM   FREET4 0.84 05/23/2010 08:23 AM       Latest Ref Rng & Units 03/20/2023   12:15 PM  08/22/2022    9:17 AM 04/22/2022    8:59 AM  CBC  WBC 4.0 - 10.5 K/uL 34.6  35.3 Repeated and verified X2.  29.3 Repeated and verified X2.   Hemoglobin 13.0 - 17.0 g/dL 60.4  54.0  98.1   Hematocrit 39.0 - 52.0 % 41.6  44.2  42.9   Platelets 150 - 400 K/uL 204  170.0  172.0     Lab Results  Component Value Date/Time   VD25OH 26.80 09/18/2020 12:00 AM   VD25OH 19.85 06/01/2019 12:00 AM   VITAMINB12 1,138 (H) 09/05/2021 09:07 AM   VITAMINB12 >1500 (H) 11/05/2019 08:21 AM    Clinical ASCVD: No  The ASCVD Risk score (Arnett DK, et al., 2019) failed to calculate for the following reasons:   The valid total cholesterol range is 130 to 320 mg/dL        19/14/7829   56:21 AM 08/22/2022    8:02 AM 04/30/2022    2:42 PM  Depression screen PHQ 2/9  Decreased Interest 0 0 0  Down, Depressed, Hopeless 0 0 0  PHQ - 2 Score 0 0 0  Altered sleeping 0 0   Tired, decreased energy 0 0   Change in appetite 0 0   Feeling  bad or failure about yourself  0 0   Trouble concentrating 0 0   Moving slowly or fidgety/restless 0 0   Suicidal thoughts 0 0   PHQ-9 Score 0 0   Difficult doing work/chores Not difficult at all Not difficult at all      Social History   Tobacco Use  Smoking Status Former   Packs/day: 1.00   Years: 20.00   Additional pack years: 0.00   Total pack years: 20.00   Types: Cigarettes   Quit date: 02/25/1981   Years since quitting: 42.1  Smokeless Tobacco Never   BP Readings from Last 3 Encounters:  03/20/23 (!) 169/74  11/20/22 118/79  10/22/22 124/70   Pulse Readings from Last 3 Encounters:  03/20/23 98  11/20/22 87  10/22/22 70   Wt Readings from Last 3 Encounters:  03/20/23 252 lb 12.8 oz (114.7 kg)  11/20/22 260 lb 9.6 oz (118.2 kg)  10/22/22 257 lb 9.6 oz (116.8 kg)   BMI Readings from Last 3 Encounters:  03/20/23 34.29 kg/m  11/20/22 35.34 kg/m  10/22/22 34.94 kg/m    Allergies  Allergen Reactions   Morphine Sulfate Itching and Rash    Medications Reviewed Today     Reviewed by Granville Lewis, PT (Physical Therapist) on 03/31/23 at 1511  Med List Status: <None>   Medication Order Taking? Sig Documenting Provider Last Dose Status Informant  amLODipine (NORVASC) 10 MG tablet [id number] No Take 10 mg by mouth daily. [provider] Taking Active   busPIRone (BUSPAR) 10 MG tablet [id number] No Take by mouth 3 (three) times daily. [provider] Taking Active   carboxymethylcellulose 1 % ophthalmic solution [id number] No Apply 1 drop to eye 3 (three) times daily. [provider] Taking Active   clobetasol cream (TEMOVATE) 0.05 % [id number] No Apply 1 Application topically 2 (two) times daily. [provider] Taking Active   Continuous Blood Gluc Sensor (FREESTYLE LIBRE 3 SENSOR) MISC [id number] No by Does not apply route. [provider] Taking Active   empagliflozin (JARDIANCE) 25 MG TABS tablet [id number] No Take 1  tablet (25 mg total) by mouth daily before breakfast. ([facility]) Shelva Majestic, MD Taking Active   fenofibrate (  TRICOR) 145 MG tablet [id number] No Take 160 mg by mouth daily. [provider] Taking Active   fluticasone (FLONASE) 50 MCG/ACT nasal spray [id number] No Place into both nostrils. [provider] Taking Active   gabapentin (NEURONTIN) 300 MG capsule [id number] No Take 600 mg by mouth at bedtime. [provider] Taking Active            Med Note Gretchen Short, [facility]   Tue Apr 30, 2022  2:34 PM) REPORTS TAKING 3 CAPSULES TID  glucose 4 GM chewable tablet [id number] No Chew 1 tablet by mouth once. 15g. [provider] Taking Active   glucose blood test strip [id number] No Use to test 4 times daily. E11.9 Shelva Majestic, MD Taking Active   hydrochlorothiazide (HYDRODIURIL) 25 MG tablet [id number] No Take 25 mg by mouth daily. [provider] Taking Active   hydrocortisone 2.5 % cream [id number] No SMARTSIG:1 Topical Daily [provider] Taking Active   hydrOXYzine (ATARAX) 10 MG tablet [id number] No Take 10 mg by mouth 3 (three) times daily. [provider] Taking Active   insulin aspart (NOVOLOG) 100 UNIT/ML injection [id number] No Inject 30 Units into the skin 3 (three) times daily with meals. When sugar is elevated [provider] Taking Active Self           Med Note (Delynn Pursley, [facility] A   Fri Dec 27, 2016  9:53 AM)    insulin glargine (LANTUS) 100 UNIT/ML injection [id number] No Inject 50 Units into the skin at bedtime. [provider] Taking Active Self  ketoconazole (NIZORAL) 2 % shampoo [id number] No Apply 1 Application topically 2 (two) times a week. [provider] Taking Active   levothyroxine (SYNTHROID, LEVOTHROID) 50 MCG tablet [id number] No Take 50 mcg by mouth daily before breakfast. [provider] Taking Active Self  losartan (COZAAR) 100 MG tablet [id number] No Take 100 mg by mouth  daily. [provider] Taking Active   mirtazapine (REMERON) 15 MG tablet [id number] No Take 15 mg by mouth at bedtime. [provider] Taking Active Self  OVER THE COUNTER MEDICATION [id number] No PreserVision for eyes [provider] Taking Active   pantoprazole (PROTONIX) 40 MG tablet [id number] No Take 40 mg by mouth daily. [provider] Taking Active   potassium chloride SA (K-DUR,KLOR-CON) 20 MEQ tablet [id number] No Take 20 mEq by mouth daily. [provider] Taking Active Self  simvastatin (ZOCOR) 80 MG tablet [id number] No Take 40 mg by mouth daily at 6 PM. Take half tablet (40 mg) by mouth once daily [provider] Taking Active   sucralfate (CARAFATE) 1 G tablet [id number] No Take 1 g by mouth 4 (four) times daily -  with meals and at bedtime. [provider] Taking Active   terbinafine (LAMISIL) 1 % cream [id number] No Apply 1 Application topically 2 (two) times daily. [provider] Taking Active   traMADol (ULTRAM) 50 MG tablet [id number] No Take 50 mg by mouth every 6 (six) hours as needed. [provider] Taking Active   trimethoprim-polymyxin b (POLYTRIM) ophthalmic solution [id number] No Apply to eye. [provider] Taking Active   zolpidem (AMBIEN) 5 MG tablet [id number] No Take 5 mg by mouth at bedtime as needed for sleep. [provider] Taking Active             SDOH:  (Social Determinants of Health) assessments and interventions performed: No, done within year  Financial Resource Strain: Low Risk  (04/30/2022)  Overall Financial Resource Strain (CARDIA)    Difficulty of Paying Living Expenses: Not hard at all   Food Insecurity: No Food Insecurity (04/30/2022)   Hunger Vital Sign    Worried About Running Out of Food in the Last Year: Never true    Ran Out of Food in the Last Year: Never true    SDOH Interventions    Flowsheet Row Chronic Care Management from 04/30/2022 in  [facility] from 06/25/2018 in [facility] [employer] [facility]  SDOH Interventions    Food Insecurity Interventions Intervention Not Indicated --  Housing Interventions Intervention Not Indicated --  Transportation Interventions Patient Resources (Friends/Family), Intervention Not Indicated --  Depression Interventions/Treatment  -- Medication  Financial Strain Interventions Intervention Not Indicated --       Medication Assistance: None required.  Patient affirms current coverage meets needs.  Medication Access: Within the past 30 days, how often has patient missed a dose of medication? 0 Is a pillbox or other method used to improve adherence? Yes  Factors that may affect medication adherence? no barriers identified Are meds synced by current pharmacy? No  Are meds delivered by current pharmacy? No  Does patient experience delays in picking up medications due to transportation concerns? No   [facility] Services Reviewed: Is patient disadvantaged to use [facility]?: Yes  Current Rx insurance plan: [employer] Name and location of Current pharmacy:  [facility] [address] [place] - [place] [address] [place] [address] Phone: [phone] Fax: [phone]  [facility] services reviewed with patient today?: Yes  Patient requests to transfer care to [facility]?: No  Reason patient declined to change pharmacies: Disadvantaged due to insurance/mail order  Compliance/Adherence/Medication fill history: [facility]  [facility]     Care Gaps: Annual wellness visit in last year? No   If Diabetic: Last eye exam / retinopathy screening: 02/26/2023 Last diabetic foot exam: 08/22/2022   Assessment/Plan      Hypertension (BP goal <130/80) -Controlled, based on recent office BP -Current treatment: Amlodipine 10mg  daily Appropriate, Effective, Safe, Accessible HCTZ 25mg   daily Appropriate, Effective, Safe, Accessible Losartan 100mg  daily Appropriate, Effective, Safe, Accessible -Medications previously tried: propranolol, chlorthalidone  -Current home readings: not checking -Denies hypotensive/hypertensive symptoms -Educated on Importance of home blood pressure monitoring; Symptoms of hypotension and importance of maintaining adequate hydration; -Counseled to monitor BP at home as able, document, and provide log at future appointments -Recommended to continue current medication Will fu on BP at upcoming OV  Hyperlipidemia: (LDL goal < 70) -Controlled -Current treatment: Simvastatin 80mg  daily Appropriate, Effective, Safe, Accessible -Medications previously tried: none noted  -Educated on Cholesterol goals;  Benefits of statin for ASCVD risk reduction; Importance of limiting foods high in cholesterol; -Recommended to continue current medication Most recent LDL is excellent, no adverse effects to medication Reinforced adherence, continue meds at this time  Diabetes (A1c goal <7%) 04/01/23 -Uncontrolled, most recent A1c is 7.9% -Current medications: Novolog 35-45 units tid with meals Appropriate, Effective, Safe, Accessible Lantus 50 units every evening Appropriate, Effective, Safe, Accessible Jardiance 25mg  Appropriate, Effective, Safe, Accessible -Medications previously tried: Ozempic (nasuea)  -Current home glucose readings fasting glucose:  post prandial glucose: 230 after oatmeal for breakfast -Denies hypoglycemic/hyperglycemic symptoms -Educated on A1c and blood sugar goals; Prevention and management of hypoglycemic episodes; -Counseled to check feet daily and get yearly eye exams -We discussed recent dietary choices:  Patient regularly eating 4 packets of  instant oatmeal plus brown sugar for breakfast.  Also eating on the go at [employer] a fair amount.  Discussed the impact of carbs on his blood sugar. -Plan today is to try 3 packets of  oatmeal with little to no additional sugars.  Could also add protein source such as egg, sausage, bacon to breakfast. -Agreeable to try this - has FU PCP appointment already scheduled for mid May.  Will recheck A1c at this time and have CMA fu on glucose periodically after that visit. -No changes to meds at this time.       Willa Frater, PharmD Clinical Pharmacist  [facility] [phone]

## 2023-04-07 ENCOUNTER — Ambulatory Visit: Payer: Medicare Other | Attending: Physician Assistant

## 2023-04-07 DIAGNOSIS — R2689 Other abnormalities of gait and mobility: Secondary | ICD-10-CM | POA: Diagnosis not present

## 2023-04-07 NOTE — Therapy (Signed)
OUTPATIENT PHYSICAL THERAPY NEURO EVALUATION   Patient Name: [name] MRN: [id number] DOB:Sep 18, 1941, 82 y.o., male Today's Date: 04/07/2023  REFERRING PROVIDER: Sharlyne Cai, PA  END OF SESSION:  PT End of Session - 04/07/23 1309     Visit Number 2    Number of Visits 4    Date for PT Re-Evaluation 05/30/23    PT Start Time 1300    PT Stop Time 1323    PT Time Calculation (min) 23 min    Activity Tolerance Patient limited by fatigue    Behavior During Therapy Agitated              Past Medical History:  Diagnosis Date   Arthritis    Basal cell carcinoma 03/18/2017   left neck CX3 5FU   Bell's palsy 05/08/2020   Cataract    Chronic bilateral low back pain without sciatica 12/12/2015   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 Thought to be agent orange related-CLL and prostate cancer  Diagnosis 1. 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 - NO EVIDENCE OF EXTRAPROSTATIC EXTENSION, ANGIOLYMPHATIC INVASION OR SEMINAL VESICLE INVOLVEMENT IDENTIFIED. - RESECTION MARGINS, NEGATIVE FOR ATYPIA OR MALIGNANCY. 2. Lymph nodes, regional resection, righ   Chronic pain syndrome 02/01/2016   Chronic right hip pain 09/27/2019   DDD (degenerative disc disease), lumbar    Diabetic retinopathy 09/24/2007   Diverticulosis of colon    Elevated PSA    Erectile dysfunction 12/30/2007   No rx.      GERD (gastroesophageal reflux disease)    Gynecomastia 12/28/2020   History of colonic polyps 08/22/2008   2004 3 adenomas 2009 none 2013 none 02/26/2017 8 mm sigmoid polyp was inflammatory - no recall planned given age/co-morbidities overall hx of polyps     History of prostate cancer 01/24/2014   Follows with Dr. Wilson Singer every 3 months. Dr. Clelia Croft every 6 months.  Diagnosis 1. 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 - NO EVIDENCE OF EXTRAPROSTATIC  EXTENSION, ANGIOLYMPHATIC INVASION OR SEMINAL VESICLE INVOLVEMENT IDENTIFIED. - RESECTION MARGINS, NEGATIVE FOR ATYPIA OR MALIGNANCY. 2. Lymph nodes, regional resection, right pelvic - SIX   Hyperlipidemia associated with type 2 diabetes mellitus 09/24/2007   High triglycerides. Refused statin due to myalgias in past but then started half tablet of simvastatin 40mg  through [facility] of this note might be different from the original. Formatting of this note might be different from the original. High triglycerides. Refused statin due to myalgias in past but then started half tablet of simvastatin 40mg  through [place]  Last Assessment & Plan:  Formatting    Hypertension associated with diabetes 09/24/2007   Amlodipine 10mg , losartan 100mg , HCTZ 25mg , propranolol 40 mg twice a day Home cuff 142/85 compared to 134/72 on my check. Likely home cuff about 10 points higher.   --> chlorthalidone 03/20/16 but patient didn't change and then BP controlled on next visit   Formatting of this note might be different from the original. Formatting of this note might be different from the original. 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  Follow up in 6 months unless wound issues arise.  Derm follow up in October 2022 scheduled.  I will see him back in 6 months.   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 a1c under 8     Formatting of this note might be different from the original. Formatting of this note might be different from the original. And macular degeneration.   Last Assessment & Plan:  Formatting of this note might be different from the original. S: diabetic retinopathy followed up by optho yesterday and largely stable. He also has m   Vitamin D deficiency 11/01/2020   At [facility]- on 2000 units a day at least since 2021    Past Surgical History:  Procedure Laterality Date   CHOLECYSTECTOMY     COLONOSCOPY  03/25/12, 09/28/08   ESOPHAGOGASTRODUODENOSCOPY N/A 10/12/2014   Procedure: ESOPHAGOGASTRODUODENOSCOPY (EGD);  Surgeon: Iva Boop, MD;  Location: [facility];  Service: Endoscopy;  Laterality: N/A;   ESOPHAGOGASTRODUODENOSCOPY ENDOSCOPY  09/28/08   EXCISION MELANOMA WITH SENTINEL LYMPH NODE BIOPSY Right 05/31/2021   Procedure: WIDE LOCAL EXCISION RIGHT FOREARM WITH ADVANCEDMENT FLAP CLOSURE WITH SENTINEL LYMPH NODE MAPPING AND BIOSPY;  Surgeon: Almond Lint, MD;  Location: [facility];  Service: General;  Laterality: Right;   FOOT SURGERY Bilateral    HEMORRHOID SURGERY     LAPAROSCOPIC GASTRIC BANDING  03/05/11   weight loss   LAPAROSCOPIC GASTRIC BANDING WITH HIATAL HERNIA REPAIR  03/05/2011   LYMPHADENECTOMY Bilateral 01/24/2014   Procedure: LYMPHADENECTOMY;  Surgeon: Crecencio Mc, MD;  Location: [facility];  Service: Urology;  Laterality: Bilateral;   ORIF TIBIA FRACTURE Right    PENILE PROSTHESIS IMPLANT     PROSTATE SURGERY  01/2014   ROBOT ASSISTED LAPAROSCOPIC RADICAL PROSTATECTOMY N/A 01/24/2014   Procedure: ROBOTIC ASSISTED LAPAROSCOPIC RADICAL PROSTATECTOMY LEVEL 3;  Surgeon: Crecencio Mc, MD;  Location: [facility];  Service: Urology;  Laterality: N/A;   SHOULDER SURGERY Right 2011    TONSILLECTOMY     age 42   VASECTOMY     Patient Active Problem List   Diagnosis Date Noted   Major depressive disorder 07/30/2022   PTSD (post-traumatic stress disorder) 07/30/2022   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/2022   Macular degeneration    Parkinson's disease 12/20/2021   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 (HCC) 06/25/2021   Hypothyroidism 06/22/2021   Gynecomastia 12/28/2020   Vitamin D deficiency 11/01/2020   Bell's palsy 05/08/2020   Senile purpura 05/02/2020   Chronic right hip pain 09/27/2019   Sacroiliac joint dysfunction 06/29/2018   Temporomandibular joint (TMJ) pain 04/14/2017   Chronic pain syndrome 02/01/2016   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 myelopathy or radiculopathy 02/01/2016   Chronic bilateral low back pain without sciatica 12/12/2015   Tremor 08/11/2015   GERD (gastroesophageal reflux disease) 12/08/2014   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   History of prostate cancer 01/24/2014   Lapband APL + HH repair 08/12/2013  Insomnia 09/21/2009   History of colonic polyps 08/22/2008   Type 2 diabetes mellitus 09/24/2007   Diabetic retinopathy 09/24/2007   Hyperlipidemia associated with type 2 diabetes mellitus 09/24/2007   OSA on CPAP 09/24/2007   Essential hypertension 09/24/2007    ONSET DATE: chronic   REFERRING DIAG: Parkinson's disease, unspecified whether dyskinesia present, unspecified whether manifestations fluctuate; macular degeneration of both eyes, unspecified type; gait disturbance  THERAPY DIAG:  Other abnormalities of gait and mobility  Rationale for Evaluation and Treatment: Rehabilitation  SUBJECTIVE:                                                                                                                                                                                             SUBJECTIVE STATEMENT: Patient reports that he feels good today. Pt accompanied by:  self  PERTINENT HISTORY: Hypertension, diabetes, diabetic retinopathy, Parkinson's disease, low back pain, history of cancer, depression, and PTSD  PAIN:  Are you having pain? No  PRECAUTIONS: None  WEIGHT BEARING RESTRICTIONS: No  FALLS: Has patient fallen in last 6 months? No  LIVING ENVIRONMENT: Lives with: lives with their spouse Lives in: House/apartment Stairs: Yes: External: 4 steps; on left going up; step to pattern Has following equipment at home: None  PLOF: Independent with basic ADLs  PATIENT GOALS: be able to have his surgery  OBJECTIVE:   COGNITION: Overall cognitive status: Within functional limits for tasks assessed   SENSATION: Patient reports no numbness or tingling.   EDEMA:  No edema observed  POSTURE: rounded shoulders, forward head, and increased thoracic kyphosis  LOWER EXTREMITY ROM: WFL for activities assessed  LOWER EXTREMITY MMT:    MMT Right Eval Left Eval  Hip flexion 4/5 4/5  Hip extension    Hip abduction    Hip adduction    Hip internal rotation    Hip external rotation    Knee flexion 4+/5 4+/5  Knee extension 5/5 5/5  Ankle dorsiflexion 4/5 4/5  Ankle plantarflexion    Ankle inversion    Ankle eversion    (Blank rows = not tested)  TRANSFERS: Assistive device utilized: None  Sit to stand: SBA Stand to sit: SBA  GAIT: Gait pattern: shuffling Distance walked: 20 feet Assistive device utilized: None Level of assistance: SBA  FUNCTIONAL TESTS:  5 times sit to stand: 31.79 seconds and required upper extremity support from armrests  Timed up and go (TUG): 22.61 seconds  TODAY'S TREATMENT:  DATE:                                     5/6 EXERCISE LOG  Exercise Repetitions and Resistance Comments  Nustep  L3 x 4.5 minutes  Limited by fatigue  LAQ 15 reps each    Seated HS curl  Red t-band x  15 reps each    Seated clam  Red t-band x 15 reps each    Seated march  Red t-band x 20 reps each    Seated hip ADD isometric  2 minutes w/ 5 second hold   Seated heel/toe raise  2 minutes   Sit to stand 6 reps  Requested 10 repetitions, but patient said "no"; with UE support from arm rests   Blank cell = exercise not performed today    PATIENT EDUCATION: Education details: POC, prognosis, and goals for therapy Person educated: Patient Education method: Explanation Education comprehension: verbalized understanding  HOME EXERCISE PROGRAM:   GOALS: Goals reviewed with patient? Yes  LONG TERM GOALS: Target date: 04/28/23  Patient will be independent with his HEP.  Baseline:  Goal status: INITIAL  2.  Patient will improve his five time sit to stand time to 15 seconds or less for improved lower extremity power.  Baseline:  Goal status: INITIAL  3.  Patient will improve his timed up and go time to 15 seconds or less for improved safety.  Baseline:  Goal status: INITIAL  4.  Patient will be able to independently transfer from sitting to standing without upper extremity support for improved household independence. Baseline:  Goal status: INITIAL  ASSESSMENT:  CLINICAL IMPRESSION: Patient was introduced to multiple new interventions for improved lower extremity strength and endurance. Fatigue was his primary limitation with today's interventions. He expressed his desire to be finished with therapy throughout treatment. He reported feeling tired upon the conclusion of treatment. He continues to require skilled physical therapy to address his remaining impairments to maximize his functional mobility.   OBJECTIVE IMPAIRMENTS: Abnormal gait, decreased activity tolerance, decreased balance, decreased mobility, difficulty walking, decreased strength, and postural dysfunction.   ACTIVITY LIMITATIONS: lifting, bending, standing, stairs, transfers, and locomotion level  PARTICIPATION  LIMITATIONS: meal prep, cleaning, laundry, shopping, and community activity  PERSONAL FACTORS: Age, Behavior pattern, Time since onset of injury/illness/exacerbation, and 3+ comorbidities: Hypertension, diabetes, diabetic retinopathy, Parkinson's disease, low back pain, history of cancer, depression, and PTSD  are also affecting patient's functional outcome.   REHAB POTENTIAL: Fair    CLINICAL DECISION MAKING: Evolving/moderate complexity  EVALUATION COMPLEXITY: Moderate  PLAN:  PT FREQUENCY: 1x/week; patient requested to come to therapy as little as possible  PT DURATION: 4 weeks  PLANNED INTERVENTIONS: Therapeutic exercises, Therapeutic activity, Neuromuscular re-education, Balance training, Gait training, Patient/Family education, Self Care, Stair training, and Re-evaluation  PLAN FOR NEXT SESSION: Nustep, lower extremity strengthening, and balance interventions   Granville Lewis, PT 04/07/2023, 1:49 PM

## 2023-04-14 ENCOUNTER — Ambulatory Visit: Payer: Medicare Other

## 2023-04-14 DIAGNOSIS — R2689 Other abnormalities of gait and mobility: Secondary | ICD-10-CM | POA: Diagnosis not present

## 2023-04-14 NOTE — Therapy (Signed)
OUTPATIENT PHYSICAL THERAPY NEURO EVALUATION   Patient Name: [name] MRN: [id number] DOB:09/26/1941, 82 y.o., male Today's Date: 04/14/2023  REFERRING PROVIDER: Sharlyne Cai, PA  END OF SESSION:  PT End of Session - 04/14/23 1303     Visit Number 3    Number of Visits 4    Date for PT Re-Evaluation 05/30/23    PT Start Time 1300    PT Stop Time 1325    PT Time Calculation (min) 25 min    Activity Tolerance Patient limited by lethargy               Past Medical History:  Diagnosis Date   Arthritis    Basal cell carcinoma 03/18/2017   left neck CX3 5FU   Bell's palsy 05/08/2020   Cataract    Chronic bilateral low back pain without sciatica 12/12/2015   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 Thought to be agent orange related-CLL and prostate cancer  Diagnosis 1. 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 - NO EVIDENCE OF EXTRAPROSTATIC EXTENSION, ANGIOLYMPHATIC INVASION OR SEMINAL VESICLE INVOLVEMENT IDENTIFIED. - RESECTION MARGINS, NEGATIVE FOR ATYPIA OR MALIGNANCY. 2. Lymph nodes, regional resection, righ   Chronic pain syndrome 02/01/2016   Chronic right hip pain 09/27/2019   DDD (degenerative disc disease), lumbar    Diabetic retinopathy 09/24/2007   Diverticulosis of colon    Elevated PSA    Erectile dysfunction 12/30/2007   No rx.      GERD (gastroesophageal reflux disease)    Gynecomastia 12/28/2020   History of colonic polyps 08/22/2008   2004 3 adenomas 2009 none 2013 none 02/26/2017 8 mm sigmoid polyp was inflammatory - no recall planned given age/co-morbidities overall hx of polyps     History of prostate cancer 01/24/2014   Follows with Dr. Wilson Singer every 3 months. Dr. Clelia Croft every 6 months.  Diagnosis 1. 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 - NO EVIDENCE OF EXTRAPROSTATIC EXTENSION, ANGIOLYMPHATIC  INVASION OR SEMINAL VESICLE INVOLVEMENT IDENTIFIED. - RESECTION MARGINS, NEGATIVE FOR ATYPIA OR MALIGNANCY. 2. Lymph nodes, regional resection, right pelvic - SIX   Hyperlipidemia associated with type 2 diabetes mellitus 09/24/2007   High triglycerides. Refused statin due to myalgias in past but then started half tablet of simvastatin 40mg  through [facility] of this note might be different from the original. Formatting of this note might be different from the original. High triglycerides. Refused statin due to myalgias in past but then started half tablet of simvastatin 40mg  through [place]  Last Assessment & Plan:  Formatting    Hypertension associated with diabetes 09/24/2007   Amlodipine 10mg , losartan 100mg , HCTZ 25mg , propranolol 40 mg twice a day Home cuff 142/85 compared to 134/72 on my check. Likely home cuff about 10 points higher.   --> chlorthalidone 03/20/16 but patient didn't change and then BP controlled on next visit   Formatting of this note might be different from the original. Formatting of this note might be different from the original. 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  Follow  up in 6 months unless wound issues arise.  Derm follow up in October 2022 scheduled.  I will see him back in 6 months.   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 a1c under 8     Formatting of this note might be different from the original. Formatting of this note might be different from the original. And macular degeneration.   Last Assessment & Plan:  Formatting of this note might be different from the original. S: diabetic retinopathy followed up by optho yesterday and largely stable. He also has m   Vitamin D deficiency 11/01/2020   At [facility]- on 2000 units a day at least since 2021    Past Surgical History:  Procedure Laterality Date   CHOLECYSTECTOMY     COLONOSCOPY  03/25/12, 09/28/08   ESOPHAGOGASTRODUODENOSCOPY N/A 10/12/2014   Procedure: ESOPHAGOGASTRODUODENOSCOPY (EGD);  Surgeon: Iva Boop, MD;  Location: [facility];  Service: Endoscopy;  Laterality: N/A;   ESOPHAGOGASTRODUODENOSCOPY ENDOSCOPY  09/28/08   EXCISION MELANOMA WITH SENTINEL LYMPH NODE BIOPSY Right 05/31/2021   Procedure: WIDE LOCAL EXCISION RIGHT FOREARM WITH ADVANCEDMENT FLAP CLOSURE WITH SENTINEL LYMPH NODE MAPPING AND BIOSPY;  Surgeon: Almond Lint, MD;  Location: [facility];  Service: General;  Laterality: Right;   FOOT SURGERY Bilateral    HEMORRHOID SURGERY     LAPAROSCOPIC GASTRIC BANDING  03/05/11   weight loss   LAPAROSCOPIC GASTRIC BANDING WITH HIATAL HERNIA REPAIR  03/05/2011   LYMPHADENECTOMY Bilateral 01/24/2014   Procedure: LYMPHADENECTOMY;  Surgeon: Crecencio Mc, MD;  Location: [facility];  Service: Urology;  Laterality: Bilateral;   ORIF TIBIA FRACTURE Right    PENILE PROSTHESIS IMPLANT     PROSTATE SURGERY  01/2014   ROBOT ASSISTED LAPAROSCOPIC RADICAL PROSTATECTOMY N/A 01/24/2014   Procedure: ROBOTIC ASSISTED LAPAROSCOPIC RADICAL PROSTATECTOMY LEVEL 3;  Surgeon: Crecencio Mc, MD;  Location: [facility];  Service: Urology;  Laterality: N/A;   SHOULDER SURGERY Right 2011   TONSILLECTOMY     age 38    VASECTOMY     Patient Active Problem List   Diagnosis Date Noted   Major depressive disorder 07/30/2022   PTSD (post-traumatic stress disorder) 07/30/2022   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/2022   Macular degeneration    Parkinson's disease 12/20/2021   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 (HCC) 06/25/2021   Hypothyroidism 06/22/2021   Gynecomastia 12/28/2020   Vitamin D deficiency 11/01/2020   Bell's palsy 05/08/2020   Senile purpura 05/02/2020   Chronic right hip pain 09/27/2019   Sacroiliac joint dysfunction 06/29/2018   Temporomandibular joint (TMJ) pain 04/14/2017   Chronic pain syndrome 02/01/2016   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 myelopathy or radiculopathy 02/01/2016   Chronic bilateral low back pain without sciatica 12/12/2015   Tremor 08/11/2015   GERD (gastroesophageal reflux disease) 12/08/2014   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   History of prostate cancer 01/24/2014   Lapband APL + HH repair 08/12/2013   Insomnia 09/21/2009  History of colonic polyps 08/22/2008   Type 2 diabetes mellitus 09/24/2007   Diabetic retinopathy 09/24/2007   Hyperlipidemia associated with type 2 diabetes mellitus 09/24/2007   OSA on CPAP 09/24/2007   Essential hypertension 09/24/2007    ONSET DATE: chronic   REFERRING DIAG: Parkinson's disease, unspecified whether dyskinesia present, unspecified whether manifestations fluctuate; macular degeneration of both eyes, unspecified type; gait disturbance  THERAPY DIAG:  Other abnormalities of gait and mobility  Rationale for Evaluation and Treatment: Rehabilitation  SUBJECTIVE:                                                                                                                                                                                             SUBJECTIVE STATEMENT: Patient reports that he did not sleep well last night.  Pt accompanied by: self  PERTINENT  HISTORY: Hypertension, diabetes, diabetic retinopathy, Parkinson's disease, low back pain, history of cancer, depression, and PTSD  PAIN:  Are you having pain? No  PRECAUTIONS: None  WEIGHT BEARING RESTRICTIONS: No  FALLS: Has patient fallen in last 6 months? No  LIVING ENVIRONMENT: Lives with: lives with their spouse Lives in: House/apartment Stairs: Yes: External: 4 steps; on left going up; step to pattern Has following equipment at home: None  PLOF: Independent with basic ADLs  PATIENT GOALS: be able to have his surgery  OBJECTIVE:   COGNITION: Overall cognitive status: Within functional limits for tasks assessed   SENSATION: Patient reports no numbness or tingling.   EDEMA:  No edema observed  POSTURE: rounded shoulders, forward head, and increased thoracic kyphosis  LOWER EXTREMITY ROM: WFL for activities assessed  LOWER EXTREMITY MMT:    MMT Right Eval Left Eval  Hip flexion 4/5 4/5  Hip extension    Hip abduction    Hip adduction    Hip internal rotation    Hip external rotation    Knee flexion 4+/5 4+/5  Knee extension 5/5 5/5  Ankle dorsiflexion 4/5 4/5  Ankle plantarflexion    Ankle inversion    Ankle eversion    (Blank rows = not tested)  TRANSFERS: Assistive device utilized: None  Sit to stand: SBA Stand to sit: SBA  GAIT: Gait pattern: shuffling Distance walked: 20 feet Assistive device utilized: None Level of assistance: SBA  FUNCTIONAL TESTS:  5 times sit to stand: 31.79 seconds and required upper extremity support from armrests  Timed up and go (TUG): 22.61 seconds  TODAY'S TREATMENT:  DATE:                                     5/13 EXERCISE LOG  Exercise Repetitions and Resistance Comments  Nustep  L3 x 6 minutes   Standing heel raise  20 reps  BUE support  Toe taps on step  8" step x 15 reps each  BUE  support  Standing HS curl  15 reps each  BUE support  LAQ 20 reps each    Seated hip ABD  Red t-band x 20 reps each    Seated clams  Red t-band x 20 reps    Seated marching  Red t-band x 20 reps each    Seated toe raise 30 reps    Sit to stand  6 reps     Blank cell = exercise not performed today                                    5/6 EXERCISE LOG  Exercise Repetitions and Resistance Comments  Nustep  L3 x 4.5 minutes  Limited by fatigue  LAQ 15 reps each    Seated HS curl  Red t-band x 15 reps each    Seated clam  Red t-band x 15 reps each    Seated march  Red t-band x 20 reps each    Seated hip ADD isometric  2 minutes w/ 5 second hold   Seated heel/toe raise  2 minutes   Sit to stand 6 reps  Requested 10 repetitions, but patient said "no"; with UE support from arm rests   Blank cell = exercise not performed today    PATIENT EDUCATION: Education details: POC, prognosis, and goals for therapy Person educated: Patient Education method: Explanation Education comprehension: verbalized understanding  HOME EXERCISE PROGRAM:   GOALS: Goals reviewed with patient? Yes  LONG TERM GOALS: Target date: 04/28/23  Patient will be independent with his HEP.  Baseline:  Goal status: INITIAL  2.  Patient will improve his five time sit to stand time to 15 seconds or less for improved lower extremity power.  Baseline:  Goal status: INITIAL  3.  Patient will improve his timed up and go time to 15 seconds or less for improved safety.  Baseline:  Goal status: INITIAL  4.  Patient will be able to independently transfer from sitting to standing without upper extremity support for improved household independence. Baseline:  Goal status: INITIAL  ASSESSMENT:  CLINICAL IMPRESSION: Patient was introduced to multiple new standing interventions. He required minimal to moderate cueing with all of today's interventions for proper exercise performance. He reported feeling tired upon the  conclusion of treatment and requested to go home early. He may continue to benefit from skilled physical therapy to address his remaining impairments to maximize his functional mobility.   OBJECTIVE IMPAIRMENTS: Abnormal gait, decreased activity tolerance, decreased balance, decreased mobility, difficulty walking, decreased strength, and postural dysfunction.   ACTIVITY LIMITATIONS: lifting, bending, standing, stairs, transfers, and locomotion level  PARTICIPATION LIMITATIONS: meal prep, cleaning, laundry, shopping, and community activity  PERSONAL FACTORS: Age, Behavior pattern, Time since onset of injury/illness/exacerbation, and 3+ comorbidities: Hypertension, diabetes, diabetic retinopathy, Parkinson's disease, low back pain, history of cancer, depression, and PTSD  are also affecting patient's functional outcome.   REHAB POTENTIAL: Fair    CLINICAL DECISION MAKING:  Evolving/moderate complexity  EVALUATION COMPLEXITY: Moderate  PLAN:  PT FREQUENCY: 1x/week; patient requested to come to therapy as little as possible  PT DURATION: 4 weeks  PLANNED INTERVENTIONS: Therapeutic exercises, Therapeutic activity, Neuromuscular re-education, Balance training, Gait training, Patient/Family education, Self Care, Stair training, and Re-evaluation  PLAN FOR NEXT SESSION: Nustep, lower extremity strengthening, and balance interventions   Granville Lewis, PT 04/14/2023, 1:34 PM

## 2023-04-15 ENCOUNTER — Encounter: Payer: Self-pay | Admitting: Family Medicine

## 2023-04-15 ENCOUNTER — Ambulatory Visit (INDEPENDENT_AMBULATORY_CARE_PROVIDER_SITE_OTHER): Payer: Medicare Other | Admitting: Family Medicine

## 2023-04-15 ENCOUNTER — Ambulatory Visit: Payer: Medicare Other

## 2023-04-15 VITALS — BP 120/78 | HR 73 | Temp 98.2°F | Ht 72.0 in | Wt 254.0 lb

## 2023-04-15 DIAGNOSIS — C9110 Chronic lymphocytic leukemia of B-cell type not having achieved remission: Secondary | ICD-10-CM

## 2023-04-15 DIAGNOSIS — E785 Hyperlipidemia, unspecified: Secondary | ICD-10-CM | POA: Diagnosis not present

## 2023-04-15 DIAGNOSIS — I10 Essential (primary) hypertension: Secondary | ICD-10-CM

## 2023-04-15 DIAGNOSIS — G20A1 Parkinson's disease without dyskinesia, without mention of fluctuations: Secondary | ICD-10-CM | POA: Diagnosis not present

## 2023-04-15 DIAGNOSIS — Z794 Long term (current) use of insulin: Secondary | ICD-10-CM | POA: Diagnosis not present

## 2023-04-15 DIAGNOSIS — E039 Hypothyroidism, unspecified: Secondary | ICD-10-CM | POA: Diagnosis not present

## 2023-04-15 DIAGNOSIS — E1169 Type 2 diabetes mellitus with other specified complication: Secondary | ICD-10-CM | POA: Diagnosis not present

## 2023-04-15 DIAGNOSIS — E538 Deficiency of other specified B group vitamins: Secondary | ICD-10-CM

## 2023-04-15 DIAGNOSIS — E11319 Type 2 diabetes mellitus with unspecified diabetic retinopathy without macular edema: Secondary | ICD-10-CM | POA: Diagnosis not present

## 2023-04-15 MED ORDER — CYANOCOBALAMIN 1000 MCG/ML IJ SOLN
1000.0000 ug | Freq: Once | INTRAMUSCULAR | Status: AC
Start: 2023-04-15 — End: 2023-04-15
  Administered 2023-04-15: 1000 ug via INTRAMUSCULAR

## 2023-04-15 NOTE — Patient Instructions (Addendum)
Consider cerave or eucerin Agree with every other day shower  I am glad you are doing so well - thrilled procedure went well  Recommended follow up: Return in about 6 months (around 10/16/2023) for followup or sooner if needed.Schedule b4 you leave.

## 2023-04-15 NOTE — Progress Notes (Signed)
Phone [phone] In person visit   Subjective:   [name] is a 82 y.o. 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    Priority: High   History of prostate cancer 01/24/2014    Priority: High   Type 2 diabetes mellitus 09/24/2007    Priority: High   Diabetic retinopathy 09/24/2007    Priority: High   PTSD (post-traumatic stress disorder) 07/30/2022    Priority: Medium    Macular degeneration     Priority: Medium    Hypothyroidism 06/22/2021    Priority: Medium    Vitamin D deficiency 11/01/2020    Priority: Medium    Tremor 08/11/2015    Priority: Medium    Lapband APL + HH repair 08/12/2013    Priority: Medium    Insomnia 09/21/2009    Priority: Medium    Hyperlipidemia associated with type 2 diabetes mellitus 09/24/2007    Priority: Medium    OSA on CPAP 09/24/2007    Priority: Medium    Essential hypertension 09/24/2007    Priority: Medium    Bell's palsy 05/08/2020    Priority: Low   GERD (gastroesophageal reflux disease) 12/08/2014    Priority: Low   History of colonic polyps 08/22/2008    Priority: Low   Chronic right hip pain 09/27/2019    Priority: 1.   Major depressive disorder 07/30/2022   Gynecomastia 12/28/2020   Sacroiliac joint dysfunction 06/29/2018   Temporomandibular joint (TMJ) pain 04/14/2017   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 myelopathy or radiculopathy 02/01/2016     Medications- reviewed and updated Current Outpatient Medications  Medication Sig Dispense Refill   amLODipine (NORVASC) 10 MG tablet Take 10 mg by mouth daily.     busPIRone (BUSPAR) 10 MG tablet Take by mouth 3 (three) times daily.     carboxymethylcellulose 1 % ophthalmic solution Apply 1 drop to eye 3 (three) times daily.     clobetasol cream (TEMOVATE) 0.05 % Apply 1 Application topically 2 (two) times daily.     Continuous Blood Gluc Sensor (FREESTYLE LIBRE 3 SENSOR) MISC by Does not apply route.     empagliflozin (JARDIANCE) 25 MG TABS tablet Take 1 tablet (25 mg total) by mouth daily before breakfast. (GIVEN [facility]) 30 tablet    fenofibrate (TRICOR) 145 MG tablet Take 160 mg by mouth daily.     fluticasone (FLONASE) 50 MCG/ACT nasal spray Place into both nostrils.     gabapentin (NEURONTIN) 300 MG capsule Take 600 mg by mouth at bedtime.     glucose 4 GM chewable tablet Chew 1 tablet by mouth once. 15g.     glucose blood test strip Use to test 4 times daily. E11.9 100 each 12   hydrochlorothiazide (HYDRODIURIL) 25 MG tablet Take 25 mg by mouth daily.     hydrocortisone 2.5 % cream SMARTSIG:1 Topical Daily     insulin aspart (NOVOLOG) 100 UNIT/ML injection Inject  35 Units into the skin 3 (three) times daily with meals. When sugar is elevated     insulin glargine (LANTUS) 100 UNIT/ML injection Inject 50 Units into the skin at bedtime.     ketoconazole (NIZORAL) 2 % shampoo Apply 1 Application topically 2 (two) times a week.     levothyroxine (SYNTHROID, LEVOTHROID) 50 MCG tablet Take 50 mcg by mouth daily before breakfast.     losartan (COZAAR) 100 MG tablet Take 100 mg by mouth daily.     mirtazapine (REMERON) 15 MG tablet Take 15 mg by mouth at bedtime.     OVER THE COUNTER MEDICATION PreserVision for eyes     pantoprazole (PROTONIX) 40 MG tablet Take 40 mg by mouth daily.     potassium chloride SA (K-DUR,KLOR-CON) 20 MEQ tablet Take 20 mEq by mouth daily.     simvastatin (ZOCOR)  80 MG tablet Take 40 mg by mouth daily at 6 PM. Take half tablet (40 mg) by mouth once daily     sucralfate (CARAFATE) 1 G tablet Take 1 g by mouth 4 (four) times daily -  with meals and at bedtime.     terbinafine (LAMISIL) 1 % cream Apply 1 Application topically 2 (two) times daily.     traMADol (ULTRAM) 50 MG tablet Take 50 mg by mouth every 6 (six) hours as needed.     trimethoprim-polymyxin b (POLYTRIM) ophthalmic solution Apply to eye.     zolpidem (AMBIEN) 5 MG tablet Take 5 mg by mouth at bedtime as needed for sleep.     No current facility-administered medications for this visit.     Objective:  BP 120/78   Pulse 73   Temp 98.2 F (36.8 C)   Ht 6' (1.829 m)   Wt 254 lb (115.2 kg)   SpO2 93%   BMI 34.45 kg/m  Gen: NAD, resting comfortably CV: RRR no murmurs rubs or gallops Lungs: CTAB no crackles, wheeze, rhonchi Abdomen: soft/nontender/nondistended/normal bowel sounds. No rebound or guarding.  Ext: trace to 1+ edema Skin: warm, dry Neuro: tremor right hand much improved, ongoing stable tremor on left resting    Assessment and Plan   #brought eye exam but was focused on macular degeneration and no mention of diabetes eye exam- they will bring [place] the next one as well- hoepfully that one will have diabetes eye exam  #Parkinson's follows with Dr. Arbutus Leas with [facility] and significant tremor S: Medication: None  -Sinemet was not helpful -Mild cognitive impairment diagnosed 07/30/2022 by Rosann Auerbach, PhD - procedure with [facility] focused [place] of left halamus for right tremor with significant improvement January 2024 (he's right handed). Trying to get approval for left.  -some headaches noted post procedure A/P: improved tremor. Remaining off medications- continue to monitor and keep neurology follow up  -working with PT once a week. Ongoing balance issues- feels drags right foot more since procedure- I am more than happy to extend physical therapy when reach out  #Hyperlipidemia S:  Controlled on simvastatin 40 mg-half of 80 mg tablet, also  with triglycerides 379-currently on fenofibrate Lab Results  Component Value Date   CHOL 124 03/26/2023   HDL 48 03/26/2023   LDLCALC 32 03/26/2023   LDLDIRECT 55.0 04/22/2022   TRIG 221 (A) 03/26/2023   CHOLHDL 3 04/22/2022  A/P: reasonable control- continue current medications . Triglyceride(s) slightly high but working on healthy eating and trying to improve movement with physical therapy    #Hypertension S: Controlled on hydrochlorothiazide 25 mg, losartan  100 mg, amlodipine 10 mg (some edema in past) -propranolol for tremor lowered HR today so he stopped this BP Readings from Last 3 Encounters:  04/15/23 120/78  03/20/23 (!) 169/74  11/20/22 118/79  A/P:  stable- continue current medicines .   #Diabetes type 2 with ophthalmic complications- lifestyle meter and freestyle. A1c goal 7.5 or less. S: [facility] -inslulin glargin 50 units. Prior lows on higher -jardiance 12.5 mg daily -novolog 35 units three times daily 10-15 minutes before meals plus sliding scale If sugar 151-200 1 extra unit, 201-250, 2 extra units, 251-300 3 extra units, 301-350, 4 extra units, 351-400 5 extra units, and over 400 6 extra units Lab Results  Component Value Date   HGBA1C 6.9 03/26/2023   HGBA1C 7.9 10/14/2022   HGBA1C 8.1 06/03/2022   A/P: doing really well- excellent control and no reported lows with endocrine help- continue current medications and endo follow up    #Hypothyroidism S: Compliant with levothyroxine 50 mcg Lab Results  Component Value Date   TSH 1.34 08/22/2022  A/P: stable- continue current medicines   #B12 deficiency S: history of gastric bypass . have recommended b12 1000 mcg per day- he prefers injections Lab Results  Component Value Date   VITAMINB12 1,138 (H) 09/05/2021   A/P: doing well- got injection today- do need to check B12 next time we do labs  %  Patient with known CLL-was following with  Dr. Clelia Croft.  Likely stable-update CBC with labs today. Saw DrMarland Kitchen Mosetta Putt on 03/20/23- sreports stable Lab Results  Component Value Date   WBC 34.6 (H) 03/20/2023   HGB 14.4 03/20/2023   HCT 41.6 03/20/2023   MCV 84.0 03/20/2023   PLT 204 03/20/2023   #history of prostate cancer- we will check psa yearly. PSA has bene undetectable Lab Results  Component Value Date   PSA1 <0.1 03/20/2023   PSA 0.00 Repeated and verified X2. (L) 08/22/2022   PSA <0.010 07/26/2021   PSA <0.010 09/18/2020   # sinus issues- taking claritin daily- helps some. Particularly runny nose after meals.   Recommended follow up: Return in about 6 months (around 10/16/2023) for followup or sooner if needed.Schedule b4 you leave. Future Appointments  Date Time Provider Department Center  04/21/2023  1:00 PM Marvell Fuller, [place] [facility]  05/01/2023  7:40 AM Sherrie George, MD [facility] None  05/20/2023  9:15 AM [facility] NURSE [facility]  07/07/2023 12:00 PM Erroll Luna, RPH [facility] None  09/18/2023 12:45 PM [facility] LAB [facility] None  09/18/2023  1:20 PM Malachy Mood, MD [facility] None    Lab/Order associations:   ICD-10-CM   1. B12 deficiency  E53.8 cyanocobalamin (VITAMIN B12) injection 1,000 mcg    2. Type 2 diabetes mellitus with retinopathy of both eyes, with long-term current use of insulin, macular edema presence unspecified, unspecified retinopathy severity (HCC) Chronic E11.319    Z79.4     3. Chronic lymphocytic leukemia (HCC)  C91.10     4. Parkinson's disease without dyskinesia, unspecified whether manifestations fluctuate  G20.A1     5. Essential hypertension  I10     6. Hyperlipidemia associated with type 2 diabetes mellitus  E11.69    E78.5     7. Hypothyroidism, unspecified type  E03.9       Meds ordered this encounter  Medications   cyanocobalamin (VITAMIN B12) injection 1,000 mcg    Return precautions advised.  Tana Conch, MD

## 2023-04-21 ENCOUNTER — Ambulatory Visit: Payer: Medicare Other | Admitting: Physical Therapy

## 2023-04-21 ENCOUNTER — Encounter: Payer: Self-pay | Admitting: Physical Therapy

## 2023-04-21 DIAGNOSIS — R2689 Other abnormalities of gait and mobility: Secondary | ICD-10-CM

## 2023-04-21 NOTE — Therapy (Addendum)
OUTPATIENT PHYSICAL THERAPY NEURO TREATMENT   Patient Name: [name] MRN: [id number] DOB:[birth year], 82 y.o., male Today's Date: 04/21/2023  REFERRING PROVIDER: Sharlyne Cai, PA  END OF SESSION:  PT End of Session - 04/21/23 1300     Visit Number 4    Number of Visits 4    Date for PT Re-Evaluation 05/30/23    PT Start Time 1301    PT Stop Time 1319    PT Time Calculation (min) 18 min    Activity Tolerance Patient limited by lethargy    Behavior During Therapy Flat affect            Past Medical History:  Diagnosis Date   Arthritis    Basal cell carcinoma 03/18/2017   left neck CX3 5FU   Bell's palsy 05/08/2020   Cataract    Chronic bilateral low back pain without sciatica 12/12/2015   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 Thought to be agent orange related-CLL and prostate cancer  Diagnosis 1. 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 - NO EVIDENCE OF EXTRAPROSTATIC EXTENSION, ANGIOLYMPHATIC INVASION OR SEMINAL VESICLE INVOLVEMENT IDENTIFIED. - RESECTION MARGINS, NEGATIVE FOR ATYPIA OR MALIGNANCY. 2. Lymph nodes, regional resection, righ   Chronic pain syndrome 02/01/2016   Chronic right hip pain 09/27/2019   DDD (degenerative disc disease), lumbar    Diabetic retinopathy 09/24/2007   Diverticulosis of colon    Elevated PSA    Erectile dysfunction 12/30/2007   No rx.      GERD (gastroesophageal reflux disease)    Gynecomastia 12/28/2020   History of colonic polyps 08/22/2008   2004 3 adenomas 2009 none 2013 none 02/26/2017 8 mm sigmoid polyp was inflammatory - no recall planned given age/co-morbidities overall hx of polyps     History of prostate cancer 01/24/2014   Follows with Dr. Wilson Singer every 3 months. Dr. Clelia Croft every 6 months.  Diagnosis 1. 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 - NO EVIDENCE OF EXTRAPROSTATIC  EXTENSION, ANGIOLYMPHATIC INVASION OR SEMINAL VESICLE INVOLVEMENT IDENTIFIED. - RESECTION MARGINS, NEGATIVE FOR ATYPIA OR MALIGNANCY. 2. Lymph nodes, regional resection, right pelvic - SIX   Hyperlipidemia associated with type 2 diabetes mellitus 09/24/2007   High triglycerides. Refused statin due to myalgias in past but then started half tablet of simvastatin 40mg  through [facility] of this note might be different from the original. Formatting of this note might be different from the original. High triglycerides. Refused statin due to myalgias in past but then started half tablet of simvastatin 40mg  through [place]  Last Assessment & Plan:  Formatting    Hypertension associated with diabetes 09/24/2007   Amlodipine 10mg , losartan 100mg , HCTZ 25mg , propranolol 40 mg twice a day Home cuff 142/85 compared to 134/72 on my check. Likely home cuff about 10 points higher.   --> chlorthalidone 03/20/16 but patient didn't change and then BP controlled on next visit   Formatting of this note might be different from the original. Formatting of this note might be different from the original. 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  Follow up in 6 months unless wound issues arise.  Derm follow up in October 2022 scheduled.  I will see him back in 6 months.   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 a1c under 8     Formatting of this note might be different from the original. Formatting of this note might be different from the original. And macular degeneration.   Last Assessment & Plan:  Formatting of this note might be different from the original. S: diabetic retinopathy followed up by optho yesterday and largely stable. He also has m   Vitamin D deficiency 11/01/2020   At [facility]- on 2000 units a day at least since 2021    Past Surgical History:  Procedure Laterality Date   CHOLECYSTECTOMY     COLONOSCOPY  03/25/12, 09/28/08   ESOPHAGOGASTRODUODENOSCOPY N/A 10/12/2014   Procedure: ESOPHAGOGASTRODUODENOSCOPY (EGD);  Surgeon: Iva Boop, MD;  Location: [facility];  Service: Endoscopy;  Laterality: N/A;   ESOPHAGOGASTRODUODENOSCOPY ENDOSCOPY  09/28/08   EXCISION MELANOMA WITH SENTINEL LYMPH NODE BIOPSY Right 05/31/2021   Procedure: WIDE LOCAL EXCISION RIGHT FOREARM WITH ADVANCEDMENT FLAP CLOSURE WITH SENTINEL LYMPH NODE MAPPING AND BIOSPY;  Surgeon: Almond Lint, MD;  Location:  [facility];  Service: General;  Laterality: Right;   FOOT SURGERY Bilateral    HEMORRHOID SURGERY     LAPAROSCOPIC GASTRIC BANDING  03/05/11   weight loss   LAPAROSCOPIC GASTRIC BANDING WITH HIATAL HERNIA REPAIR  03/05/2011   LYMPHADENECTOMY Bilateral 01/24/2014   Procedure: LYMPHADENECTOMY;  Surgeon: Crecencio Mc, MD;  Location: [facility];  Service: Urology;  Laterality: Bilateral;   ORIF TIBIA FRACTURE Right    PENILE PROSTHESIS IMPLANT     PROSTATE SURGERY  01/2014   ROBOT ASSISTED LAPAROSCOPIC RADICAL PROSTATECTOMY N/A 01/24/2014   Procedure: ROBOTIC ASSISTED LAPAROSCOPIC RADICAL PROSTATECTOMY LEVEL 3;  Surgeon: Crecencio Mc, MD;  Location: [facility];  Service: Urology;  Laterality: N/A;   SHOULDER SURGERY Right 2011    TONSILLECTOMY     age 5   VASECTOMY     Patient Active Problem List   Diagnosis Date Noted   Major depressive disorder 07/30/2022   PTSD (post-traumatic stress disorder) 07/30/2022   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/2022   Macular degeneration    Parkinson's disease 12/20/2021   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 (HCC) 06/25/2021   Hypothyroidism 06/22/2021   Gynecomastia 12/28/2020   Vitamin D deficiency 11/01/2020   Bell's palsy 05/08/2020   Chronic right hip pain 09/27/2019   Sacroiliac joint dysfunction 06/29/2018   Temporomandibular joint (TMJ) pain 04/14/2017   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 myelopathy or radiculopathy 02/01/2016   Chronic bilateral low back pain without sciatica 12/12/2015   Tremor 08/11/2015   GERD (gastroesophageal reflux disease) 12/08/2014   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   History of prostate cancer 01/24/2014   Lapband APL + HH repair 08/12/2013   Insomnia 09/21/2009   History of colonic polyps 08/22/2008  Type 2 diabetes mellitus 09/24/2007   Diabetic retinopathy 09/24/2007   Hyperlipidemia associated with type 2 diabetes mellitus 09/24/2007   OSA on CPAP 09/24/2007   Essential hypertension 09/24/2007   ONSET DATE: chronic   REFERRING DIAG: Parkinson's disease, unspecified whether dyskinesia present, unspecified whether manifestations fluctuate; macular degeneration of both eyes, unspecified type; gait disturbance  THERAPY DIAG:  Other abnormalities of gait and mobility  Rationale for Evaluation and Treatment: Rehabilitation  SUBJECTIVE:                                                                                                                                                                                             SUBJECTIVE STATEMENT: Reports he is tired on arrival as he walked around his property today. Pt accompanied by: self  PERTINENT HISTORY: Hypertension,  diabetes, diabetic retinopathy, Parkinson's disease, low back pain, history of cancer, depression, and PTSD  PAIN:  Are you having pain? No  PRECAUTIONS: None  WEIGHT BEARING RESTRICTIONS: No  FALLS: Has patient fallen in last 6 months? No  LIVING ENVIRONMENT: Lives with: lives with their spouse Lives in: House/apartment Stairs: Yes: External: 4 steps; on left going up; step to pattern Has following equipment at home: None  PLOF: Independent with basic ADLs  PATIENT GOALS: be able to have his surgery  OBJECTIVE:   COGNITION: Overall cognitive status: Within functional limits for tasks assessed   SENSATION: Patient reports no numbness or tingling.   EDEMA:  No edema observed  POSTURE: rounded shoulders, forward head, and increased thoracic kyphosis  LOWER EXTREMITY ROM: WFL for activities assessed  LOWER EXTREMITY MMT:    MMT Right Eval Left Eval  Hip flexion 4/5 4/5  Hip extension    Hip abduction    Hip adduction    Hip internal rotation    Hip external rotation    Knee flexion 4+/5 4+/5  Knee extension 5/5 5/5  Ankle dorsiflexion 4/5 4/5  Ankle plantarflexion    Ankle inversion    Ankle eversion    (Blank rows = not tested)  TRANSFERS: Assistive device utilized: None  Sit to stand: SBA Stand to sit: SBA  GAIT: Gait pattern: shuffling Distance walked: 20 feet Assistive device utilized: None Level of assistance: SBA  FUNCTIONAL TESTS: taken on eval 5 times sit to stand: 31.79 seconds and required upper extremity support from armrests  Timed up and go (TUG): 22.61 seconds  TODAY'S TREATMENT:  DATE:     5/20 EXERCISE LOG  Exercise Repetitions and Resistance Comments  Nustep  L3 x 6 minutes   Sit to stands Goal assessment x5 reps- 19 sec BUE support  TUG 17 sec   Heel/toe raises X20 reps   Hip flexion X15 reps   Hip  abduction X20 reps   LAQ 3# x20 reps   Seated HS curl Red theraband x20 reps   Seated hip clam Red theraband x30 reps        Blank cell = exercise not performed today   PATIENT EDUCATION: Education details: POC, prognosis, and goals for therapy Person educated: Patient Education method: Explanation Education comprehension: verbalized understanding  HOME EXERCISE PROGRAM:  GOALS: Goals reviewed with patient? Yes  LONG TERM GOALS: Target date: 04/28/23  Patient will be independent with his HEP.  Baseline:  Goal status: UNABLE TO ASSESS  2.  Patient will improve his five time sit to stand time to 15 seconds or less for improved lower extremity power.  Baseline:  Goal status: NOT MET  3.  Patient will improve his timed up and go time to 15 seconds or less for improved safety.  Baseline:  Goal status: NOT MET  4.  Patient will be able to independently transfer from sitting to standing without upper extremity support for improved household independence. Baseline:  Goal status: NOT MET  ASSESSMENT:  CLINICAL IMPRESSION: Patient presented in clinic with reports of fatigue as he had walked around his home some and environment was uneven. Although sit to stands and TUG score improved from evaluation, the scores were not goal status. Patient continues to require UE support for transfers and even noted while ambulating. Patient progressed through therex to his tolerance before he requested ending session due to LE fatigue. No LTGs achieved in sessions which were limited via patient request.  PHYSICAL THERAPY DISCHARGE SUMMARY  Visits from Start of Care: 4  Current functional level related to goals / functional outcomes: Patient was unable to meet his goals for skilled physical therapy.    Remaining deficits: Lower extremity strength and power   Education / Equipment: HEP   Patient agrees to discharge. Patient goals were not met. Patient is being discharged due to the  patient's request.  Candi Leash, PT, DPT    OBJECTIVE IMPAIRMENTS: Abnormal gait, decreased activity tolerance, decreased balance, decreased mobility, difficulty walking, decreased strength, and postural dysfunction.   ACTIVITY LIMITATIONS: lifting, bending, standing, stairs, transfers, and locomotion level  PARTICIPATION LIMITATIONS: meal prep, cleaning, laundry, shopping, and community activity  PERSONAL FACTORS: Age, Behavior pattern, Time since onset of injury/illness/exacerbation, and 3+ comorbidities: Hypertension, diabetes, diabetic retinopathy, Parkinson's disease, low back pain, history of cancer, depression, and PTSD  are also affecting patient's functional outcome.   REHAB POTENTIAL: Fair    CLINICAL DECISION MAKING: Evolving/moderate complexity  EVALUATION COMPLEXITY: Moderate  PLAN:  PT FREQUENCY: 1x/week; patient requested to come to therapy as little as possible  PT DURATION: 4 weeks  PLANNED INTERVENTIONS: Therapeutic exercises, Therapeutic activity, Neuromuscular re-education, Balance training, Gait training, Patient/Family education, Self Care, Stair training, and Re-evaluation  PLAN FOR NEXT SESSION: DC  Marvell Fuller, PTA 04/21/2023, 1:32 PM

## 2023-04-22 DIAGNOSIS — G894 Chronic pain syndrome: Secondary | ICD-10-CM | POA: Diagnosis not present

## 2023-04-22 DIAGNOSIS — M47816 Spondylosis without myelopathy or radiculopathy, lumbar region: Secondary | ICD-10-CM | POA: Diagnosis not present

## 2023-04-22 DIAGNOSIS — Z79899 Other long term (current) drug therapy: Secondary | ICD-10-CM | POA: Diagnosis not present

## 2023-04-22 DIAGNOSIS — Z5181 Encounter for therapeutic drug level monitoring: Secondary | ICD-10-CM | POA: Diagnosis not present

## 2023-04-29 ENCOUNTER — Telehealth: Payer: Self-pay | Admitting: Neurology

## 2023-04-29 NOTE — Telephone Encounter (Signed)
Called patient and left message that I could not find any results that needed to be called for this patient

## 2023-04-29 NOTE — Telephone Encounter (Signed)
Patient said he missed a call about his results.

## 2023-05-01 ENCOUNTER — Encounter (INDEPENDENT_AMBULATORY_CARE_PROVIDER_SITE_OTHER): Payer: Medicare Other | Admitting: Ophthalmology

## 2023-05-01 DIAGNOSIS — H353231 Exudative age-related macular degeneration, bilateral, with active choroidal neovascularization: Secondary | ICD-10-CM | POA: Diagnosis not present

## 2023-05-01 DIAGNOSIS — H35033 Hypertensive retinopathy, bilateral: Secondary | ICD-10-CM

## 2023-05-01 DIAGNOSIS — H43813 Vitreous degeneration, bilateral: Secondary | ICD-10-CM | POA: Diagnosis not present

## 2023-05-01 DIAGNOSIS — I10 Essential (primary) hypertension: Secondary | ICD-10-CM | POA: Diagnosis not present

## 2023-05-19 ENCOUNTER — Ambulatory Visit (INDEPENDENT_AMBULATORY_CARE_PROVIDER_SITE_OTHER): Payer: Medicare Other

## 2023-05-19 VITALS — Wt 254.0 lb

## 2023-05-19 DIAGNOSIS — Z0000 Encounter for general adult medical examination without abnormal findings: Secondary | ICD-10-CM

## 2023-05-19 NOTE — Progress Notes (Signed)
I connected with  [name] on 05/19/23 by a audio enabled telemedicine application and verified that I am speaking with the correct person using two identifiers.  Patient Location: Home  Provider Location: Office/Clinic  I discussed the limitations of evaluation and management by telemedicine. The patient expressed understanding and agreed to proceed.   Subjective:   [name] is a 82 y.o. male who presents for Medicare Annual/Subsequent preventive examination.  Review of Systems     Cardiac Risk Factors include: advanced age (>23men, >69 women);hypertension;dyslipidemia;diabetes mellitus;obesity (BMI >30kg/m2)     Objective:    Today's Vitals   05/19/23 1033  Weight: 254 lb (115.2 kg)   Body mass index is 34.45 kg/m.     05/19/2023   10:41 AM 10/17/2022   10:02 AM 10/08/2022    1:40 PM 04/30/2022    2:30 PM 11/13/2021   11:42 AM 09/24/2021    8:53 AM 05/31/2021    8:51 AM  Advanced Directives  Does Patient Have a Medical Advance Directive? Yes Yes Yes Yes Yes Yes Yes  Type of Estate agent of [place];Living will Living will Living will Healthcare Power of [employer] Power of Attorney Living will Healthcare Power of Attorney  Does patient want to make changes to medical advance directive? No - Patient declined   No - Patient declined   No - Patient declined  Copy of Healthcare Power of Attorney in Chart? Yes - validated most recent copy scanned in chart (See row information)   Yes - validated most recent copy scanned in chart (See row information) Yes - validated most recent copy scanned in chart (See row information)  Yes - validated most recent copy scanned in chart (See row information)    Current Medications (verified) Outpatient Encounter Medications as of 05/19/2023  Medication Sig   amLODipine (NORVASC) 10 MG tablet Take 10 mg by mouth daily.   busPIRone (BUSPAR) 10 MG tablet Take by mouth 3 (three) times daily.    carboxymethylcellulose 1 % ophthalmic solution Apply 1 drop to eye 3 (three) times daily.   clobetasol cream (TEMOVATE) 0.05 % Apply 1 Application topically 2 (two) times daily.   Continuous Blood Gluc Sensor (FREESTYLE LIBRE 3 SENSOR) MISC by Does not apply route.   empagliflozin (JARDIANCE) 25 MG TABS tablet Take 1 tablet (25 mg total) by mouth daily before breakfast. ([facility])   fenofibrate (TRICOR) 145 MG tablet Take 160 mg by mouth daily.   fluticasone (FLONASE) 50 MCG/ACT nasal spray Place into both nostrils.   gabapentin (NEURONTIN) 300 MG capsule Take 600 mg by mouth at bedtime.   glucose 4 GM chewable tablet Chew 1 tablet by mouth once. 15g.   glucose blood test strip Use to test 4 times daily. E11.9   hydrochlorothiazide (HYDRODIURIL) 25 MG tablet Take 25 mg by mouth daily.   hydrocortisone 2.5 % cream SMARTSIG:1 Topical Daily   insulin aspart (NOVOLOG) 100 UNIT/ML injection Inject 35 Units into the skin 3 (three) times daily with meals. When sugar is elevated   insulin glargine (LANTUS) 100 UNIT/ML injection Inject 50 Units into the skin at bedtime.   ketoconazole (NIZORAL) 2 % shampoo Apply 1 Application topically 2 (two) times a week.   levothyroxine (SYNTHROID, LEVOTHROID) 50 MCG tablet Take 50 mcg by mouth daily before breakfast.   losartan (COZAAR) 100 MG tablet Take 100 mg by mouth daily.   mirtazapine (REMERON) 15 MG tablet Take 15 mg by mouth at bedtime.  OVER THE COUNTER MEDICATION PreserVision for eyes   pantoprazole (PROTONIX) 40 MG tablet Take 40 mg by mouth daily.   potassium chloride SA (K-DUR,KLOR-CON) 20 MEQ tablet Take 20 mEq by mouth daily.   simvastatin (ZOCOR) 80 MG tablet Take 40 mg by mouth daily at 6 PM. Take half tablet (40 mg) by mouth once daily   sucralfate (CARAFATE) 1 G tablet Take 1 g by mouth 4 (four) times daily -  with meals and at bedtime.   terbinafine (LAMISIL) 1 % cream Apply 1 Application topically 2 (two) times daily.   traMADol (ULTRAM)  50 MG tablet Take 50 mg by mouth every 6 (six) hours as needed.   trimethoprim-polymyxin b (POLYTRIM) ophthalmic solution Apply to eye.   zolpidem (AMBIEN) 5 MG tablet Take 5 mg by mouth at bedtime as needed for sleep.   No facility-administered encounter medications on file as of 05/19/2023.    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 Thought to be agent orange related-CLL and prostate cancer  Diagnosis 1. 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 - NO EVIDENCE OF EXTRAPROSTATIC EXTENSION, ANGIOLYMPHATIC INVASION OR SEMINAL VESICLE INVOLVEMENT IDENTIFIED. - RESECTION MARGINS, NEGATIVE FOR ATYPIA OR MALIGNANCY. 2. Lymph nodes, regional resection, righ   Chronic pain syndrome 02/01/2016   Chronic right hip pain 09/27/2019   DDD (degenerative disc disease), lumbar    Diabetic retinopathy 09/24/2007   Diverticulosis of colon    Elevated PSA    Erectile dysfunction 12/30/2007   No rx.      GERD (gastroesophageal reflux disease)    Gynecomastia 12/28/2020   History of colonic polyps 08/22/2008   2004 3 adenomas 2009 none 2013 none 02/26/2017 8 mm sigmoid polyp was inflammatory - no recall planned given age/co-morbidities overall hx of polyps     History of prostate cancer 01/24/2014   Follows with Dr. Wilson Singer every 3 months. Dr. Clelia Croft every 6 months.  Diagnosis 1. 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 - NO EVIDENCE OF EXTRAPROSTATIC EXTENSION, ANGIOLYMPHATIC INVASION OR SEMINAL VESICLE INVOLVEMENT IDENTIFIED. - RESECTION MARGINS, NEGATIVE FOR ATYPIA OR MALIGNANCY. 2. Lymph nodes, regional resection,  right pelvic - SIX   Hyperlipidemia associated with type 2 diabetes mellitus 09/24/2007   High triglycerides. Refused statin due to myalgias in past but then started half tablet of simvastatin 40mg  through [facility] of this note might be different from the original. Formatting of this note might be different from the original. High triglycerides. Refused statin due to myalgias in past but then started half tablet of simvastatin 40mg  through [place]  Last Assessment & Plan:  Formatting    Hypertension associated with diabetes 09/24/2007   Amlodipine 10mg , losartan 100mg , HCTZ 25mg , propranolol 40 mg twice a day Home cuff 142/85 compared to 134/72 on my check. Likely home cuff about 10 points higher.   --> chlorthalidone 03/20/16 but patient didn't change and then BP controlled on next visit   Formatting of this note might be different from the original. Formatting of this note might be different from the original. 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  Follow up in 6 months unless wound issues arise.  Derm follow up in October 2022 scheduled.  I will see him back in 6 months.   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 a1c under 8     Formatting of this note might be different from the original. Formatting of this note might be different from the original. And macular degeneration.   Last Assessment & Plan:  Formatting of this note might be different from the original. S: diabetic retinopathy followed up by optho yesterday and largely stable. He also has m   Vitamin D deficiency 11/01/2020   At [facility]- on 2000 units a day at least since 2021    Past Surgical History:  Procedure Laterality Date   CHOLECYSTECTOMY     COLONOSCOPY  03/25/12, 09/28/08   ESOPHAGOGASTRODUODENOSCOPY N/A 10/12/2014   Procedure: ESOPHAGOGASTRODUODENOSCOPY (EGD);  Surgeon: Iva Boop, MD;  Location: [facility];  Service: Endoscopy;  Laterality: N/A;   ESOPHAGOGASTRODUODENOSCOPY ENDOSCOPY  09/28/08   EXCISION MELANOMA WITH SENTINEL LYMPH NODE BIOPSY Right 05/31/2021   Procedure: WIDE LOCAL EXCISION RIGHT FOREARM WITH ADVANCEDMENT FLAP CLOSURE WITH SENTINEL LYMPH NODE MAPPING AND BIOSPY;  Surgeon: Almond Lint, MD;  Location: [facility];  Service: General;  Laterality: Right;   FOOT SURGERY Bilateral    HEMORRHOID SURGERY     LAPAROSCOPIC GASTRIC BANDING  03/05/11   weight loss   LAPAROSCOPIC GASTRIC BANDING WITH HIATAL HERNIA REPAIR  03/05/2011   LYMPHADENECTOMY Bilateral 01/24/2014   Procedure: LYMPHADENECTOMY;  Surgeon: Crecencio Mc, MD;  Location: [facility];  Service: Urology;  Laterality: Bilateral;   ORIF TIBIA FRACTURE Right    PENILE PROSTHESIS IMPLANT     PROSTATE SURGERY  01/2014   ROBOT ASSISTED LAPAROSCOPIC RADICAL PROSTATECTOMY N/A 01/24/2014   Procedure: ROBOTIC ASSISTED LAPAROSCOPIC RADICAL PROSTATECTOMY LEVEL 3;  Surgeon: Crecencio Mc, MD;  Location: [facility];  Service: Urology;  Laterality: N/A;   SHOULDER SURGERY Right 2011   TONSILLECTOMY     age 88   VASECTOMY     Family History  Problem Relation Age of Onset   Hypertension Mother    Stomach cancer Mother 61   Alcohol abuse Brother     Diabetes Child    Hypertension Child    Hypertension Paternal Uncle    Heart attack Paternal Uncle    Parkinson's disease Other        several cousins and uncles   Colon cancer Neg Hx    Colon polyps Neg Hx    Rectal cancer Neg Hx    Social History   Socioeconomic History   Marital status: Married    Spouse name: Not on file  Number of children: 5   Years of education: 14   Highest education level: Associate degree: occupational, Scientist, product/process development, or vocational program  Occupational History   Occupation: retired    Associate Professor: RETIRED    Comment: army x 2 years; Designer, fashion/clothing; Personnel officer  Tobacco Use   Smoking status: Former    Packs/day: 1.00    Years: 20.00    Additional pack years: 0.00    Total pack years: 20.00    Types: Cigarettes    Quit date: 02/25/1981    Years since quitting: 42.2   Smokeless tobacco: Never  Vaping Use   Vaping Use: Never used  Substance and Sexual Activity   Alcohol use: Not Currently    Comment: stopped drinking 76 or 77    Drug use: No   Sexual activity: Yes    Comment: Been able to utilize his penile prosthesis successfully  Other Topics Concern   Not on file  Social History Narrative   Married. 3 children from previous marriage, 2 step children. 15 grandkids.       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 could not, watch tv ([employer])   Social Determinants of Health   Financial Resource Strain: Low Risk  (05/19/2023)   Overall Financial Resource Strain (CARDIA)    Difficulty of Paying Living Expenses: Not hard at all  Food Insecurity: No Food Insecurity (05/19/2023)   Hunger Vital Sign    Worried About Running Out of Food in the Last Year: Never true    Ran Out of Food in the Last Year: Never true  Transportation Needs: No Transportation Needs (05/19/2023)   PRAPARE - Administrator, Civil Service (Medical): No    Lack of Transportation (Non-Medical): No  Physical Activity: Insufficiently  Active (05/19/2023)   Exercise Vital Sign    Days of Exercise per Week: 2 days    Minutes of Exercise per Session: 30 min  Stress: No Stress Concern Present (05/19/2023)   [employer] of Occupational Health - Occupational Stress Questionnaire    Feeling of Stress : Not at all  Recent Concern: Stress - Stress Concern Present (04/11/2023)   [employer] of Occupational Health - Occupational Stress Questionnaire    Feeling of Stress : To some extent  Social Connections: Socially Integrated (05/19/2023)   Social Connection and Isolation Panel [NHANES]    Frequency of Communication with Friends and Family: Once a week    Frequency of Social Gatherings with Friends and Family: Twice a week    Attends Religious Services: More than 4 times per year    Active Member of [employer] or Organizations: Yes    Attends Banker Meetings: 1 to 4 times per year    Marital Status: Married    Tobacco Counseling Counseling given: Not Answered   Clinical Intake:  Pre-visit preparation completed: Yes  Pain : No/denies pain     BMI - recorded: 34.45 Nutritional Status: BMI > 30  Obese Nutritional Risks: None Diabetes: Yes CBG done?: No Did pt. bring in CBG monitor from home?: No  How often do you need to have someone help you when you read instructions, pamphlets, or other written materials from your doctor or pharmacy?: 1 - Never  Diabetic?Nutrition Risk Assessment:  Has the patient had any N/V/D within the last 2 months?  No  Does the patient have any non-healing wounds?  No  Has the patient had any unintentional weight loss or weight  gain?  No   Diabetes:  Is the patient diabetic?  Yes  If diabetic, was a CBG obtained today?  No  Did the patient bring in their glucometer from home?  No  How often do you monitor your CBG's? N/a.   Financial Strains and Diabetes Management:  Are you having any financial strains with the device, your supplies or your medication? No .   Does the patient want to be seen by Chronic Care Management for management of their diabetes?  No  Would the patient like to be referred to a Nutritionist or for Diabetic Management?  No   Diabetic Exams:  Diabetic Eye Exam: Completed 02/26/23 Diabetic Foot Exam: Completed 08/22/22   Interpreter Needed?: No  Information entered by :: Lanier Ensign, LPN   Activities of Daily Living    05/19/2023   10:41 AM  In your present state of health, do you have any difficulty performing the following activities:  Hearing? 1  Comment HOH  Vision? 0  Difficulty concentrating or making decisions? 0  Walking or climbing stairs? 0  Dressing or bathing? 1  Comment wife assist  Doing errands, shopping? 0  Preparing Food and eating ? Y  Comment wife prepares  Using the Toilet? N  In the past six months, have you accidently leaked urine? Y  Comment at times  Do you have problems with loss of bowel control? N  Managing your Medications? N  Managing your Finances? N  Housekeeping or managing your Housekeeping? N    Patient Care Team: Shelva Majestic, MD as PCP - General (Family Medicine) Sherrie George, MD as Consulting Physician (Ophthalmology) Janalyn Harder, MD (Inactive) as Consulting Physician (Dermatology) Erroll Luna, [facility] (Pharmacist)  Indicate any recent Medical Services you may have received from other than [facility] providers in the past year (date may be approximate).     Assessment:   This is a routine wellness examination for [place].  Hearing/Vision screen Hearing Screening - Comments:: HOH  Vision Screening - Comments:: Pt follows up with Dr Ashley Royalty for annual eye exams   Dietary issues and exercise activities discussed: Current Exercise Habits: Home exercise routine, Type of exercise: walking, Time (Minutes): 30, Frequency (Times/Week): 2, Weekly Exercise (Minutes/Week): 60   Goals Addressed             This Visit's Progress    Patient Stated        Stay healthy        Depression Screen    05/19/2023   10:40 AM 04/15/2023    7:43 AM 11/20/2022   10:40 AM 08/22/2022    8:02 AM 04/30/2022    2:42 PM 11/13/2021   11:41 AM 09/05/2021    8:13 AM  PHQ 2/9 Scores  PHQ - 2 Score 0 0 0 0 0 0 0  PHQ- 9 Score 0 0 0 0       Fall Risk    05/19/2023   10:41 AM 10/22/2022   11:30 AM 10/17/2022   10:02 AM 10/08/2022    1:40 PM 04/30/2022    2:40 PM  Fall Risk   Falls in the past year? 0 0 0 0 0  Number falls in past yr: 0 0  0 0  Injury with Fall? 0 0 0 0 0  Risk for fall due to : No Fall Risks Impaired balance/gait;History of fall(s)   Medication side effect;Impaired balance/gait;Impaired mobility;Impaired vision  Follow up Falls prevention discussed  Falls evaluation completed Falls evaluation  completed Falls evaluation completed;Education provided;Falls prevention discussed    FALL RISK PREVENTION PERTAINING TO THE HOME:  Any stairs in or around the home? Yes  If so, are there any without handrails? No  Home free of loose throw rugs in walkways, pet beds, electrical cords, etc? Yes  Adequate lighting in your home to reduce risk of falls? Yes   ASSISTIVE DEVICES UTILIZED TO PREVENT FALLS:  Life alert? Yes  Use of a cane, walker or w/c? Yes  Grab bars in the bathroom? Yes  Shower chair or bench in shower? Yes  Elevated toilet seat or a handicapped toilet? Yes   TIMED UP AND GO:  Was the test performed? No .   Cognitive Function:        05/19/2023   10:43 AM 11/13/2021   11:44 AM 12/18/2017   10:41 AM  6CIT Screen  What Year? 0 points 0 points 0 points  What month? 0 points 0 points 0 points  What time? 0 points 0 points 0 points  Count back from 20 0 points 0 points 0 points  Months in reverse 0 points 0 points 0 points  Repeat phrase 0 points 0 points 0 points  Total Score 0 points 0 points 0 points    Immunizations Immunization History  Administered Date(s) Administered   Fluad Quad(high Dose 65+) 07/29/2019,  08/08/2020, 08/24/2022   Hep A / Hep B 07/29/2011, 01/27/2012   Hepatitis B 08/26/2011   Influenza Split 09/18/2012   Influenza Whole 09/22/2007, 09/01/2009, 08/31/2010   Influenza, High Dose Seasonal PF 08/13/2017, 07/30/2018, 08/21/2022   Influenza,inj,Quad PF,6+ Mos 08/11/2013, 08/31/2014, 08/11/2015, 09/19/2021   Influenza-Unspecified 08/20/2016   PFIZER Comirnaty(Gray Top)Covid-19 Tri-Sucrose Vaccine 08/21/2022   PFIZER(Purple Top)SARS-COV-2 Vaccination 12/13/2019, 01/16/2020, 07/26/2020, 03/19/2021, 08/25/2021   PNEUMOCOCCAL CONJUGATE-20 05/04/2021   Pfizer Covid-19 Vaccine Bivalent Booster 27yrs & up 08/21/2022   Pneumococcal Conjugate-13 09/06/2014   Pneumococcal Polysaccharide-23 12/02/2008   Respiratory Syncytial Virus Vaccine,Recomb Aduvanted(Arexvy) 09/23/2022   Td 05/07/2007   Tdap 08/31/2014   Zoster Recombinat (Shingrix) 03/30/2018, 01/27/2020   Zoster, Live 12/03/2007    TDAP status: Up to date  Flu Vaccine status: Up to date  Pneumococcal vaccine status: Up to date  Covid-19 vaccine status: Completed vaccines  Qualifies for Shingles Vaccine? Yes   Zostavax completed Yes   Shingrix Completed?: Yes  Screening Tests Health Maintenance  Topic Date Due   COVID-19 Vaccine (7 - 2023-24 season) 10/16/2022   Colonoscopy  11/18/2023 (Originally 02/26/2022)   INFLUENZA VACCINE  07/03/2023   Diabetic kidney evaluation - Urine ACR  08/23/2023   FOOT EXAM  08/23/2023   HEMOGLOBIN A1C  09/25/2023   OPHTHALMOLOGY EXAM  02/26/2024   Diabetic kidney evaluation - eGFR measurement  03/25/2024   Medicare Annual Wellness (AWV)  05/18/2024   DTaP/Tdap/Td (3 - Td or Tdap) 08/31/2024   Pneumonia Vaccine 41+ Years old  Completed   Hepatitis C Screening  Completed   Zoster Vaccines- Shingrix  Completed   HPV VACCINES  Aged Out    Health Maintenance  Health Maintenance Due  Topic Date Due   COVID-19 Vaccine (7 - 2023-24 season) 10/16/2022    Colorectal cancer screening:  No longer required. Per pt   Additional Screening:  Hepatitis C Screening:  Completed 06/07/20  Vision Screening: Recommended annual ophthalmology exams for early detection of glaucoma and other disorders of the eye. Is the patient up to date with their annual eye exam?  Yes  Who is the provider or what is  the name of the office in which the patient attends annual eye exams? Dr Ashley Royalty  If pt is not established with a provider, would they like to be referred to a provider to establish care? No .   Dental Screening: Recommended annual dental exams for proper oral hygiene  Community Resource Referral / Chronic Care Management: CRR required this visit?  No   CCM required this visit?  No      Plan:     I have personally reviewed and noted the following in the patient's chart:   Medical and social history Use of alcohol, tobacco or illicit drugs  Current medications and supplements including opioid prescriptions. Patient is currently taking opioid prescriptions. Information provided to patient regarding non-opioid alternatives. Patient advised to discuss non-opioid treatment plan with their provider. Functional ability and status Nutritional status Physical activity Advanced directives List of other physicians Hospitalizations, surgeries, and ER visits in previous 12 months Vitals Screenings to include cognitive, depression, and falls Referrals and appointments  In addition, I have reviewed and discussed with patient certain preventive protocols, quality metrics, and best practice recommendations. A written personalized care plan for preventive services as well as general preventive health recommendations were provided to patient.     Marzella Schlein, LPN   [id number]/61/[id number]   Nurse Notes: none

## 2023-05-19 NOTE — Patient Instructions (Signed)
[name] , Thank you for taking time to come for your Medicare Wellness Visit. I appreciate your ongoing commitment to your health goals. Please review the following plan we discussed and let me know if I can assist you in the future.   These are the goals we discussed:  Goals      Monitor and Manage My Blood Sugar-Diabetes Type 2     Timeframe:  Long-Range Goal Priority:  High Start Date:    12/10/21                         Expected End Date: 06/09/22                      Follow Up Date 03/10/22    - check blood sugar at prescribed times - check blood sugar before and after exercise - check blood sugar if I feel it is too high or too low    Why is this important?   Checking your blood sugar at home helps to keep it from getting very high or very low.  Writing the results in a diary or log helps the doctor know how to care for you.  Your blood sugar log should have the time, date and the results.  Also, write down the amount of insulin or other medicine that you take.  Other information, like what you ate, exercise done and how you were feeling, will also be helpful.     Notes:      Patient Stated     Try to walk some or get up and go some  Keep taking your Cruises and getting out       Patient Stated     Stay healthy     Patient Stated     Stay healthy      PharmD Care Plan     CARE PLAN ENTRY (see longitudinal plan of care for additional care plan information)  Current Barriers:  Chronic Disease Management support, education, and care coordination needs related to Hypertension, Hyperlipidemia, and Diabetes   Hypertension BP Readings from Last 3 Encounters:  11/27/20 130/76  10/31/20 136/76  08/09/20 (!) 147/79  Pharmacist Clinical Goal(s): Over the next 365 days, patient will work with PharmD and providers to maintain BP goal <140/90 Current regimen:  Losartan 100 mg once daily  HCTZ 25 mg once daily Amlodipine 10 mg once daily Interventions: Reviewed home  monitoring recommendations Reviewed diet and exercise - Maintain a healthy weight and exercise regularly, as directed by your health care provider. Eat healthy foods, such as: Lean proteins, complex carbohydrates, fresh fruits and vegetables, low-fat dairy products, healthy fats. Patient self care activities - Over the next 365 days, patient will: Check BP at least once every 1-2 weeks, document, and provide at future appointments Ensure daily salt intake < 2300 mg/day  Hyperlipidemia Lab Results  Component Value Date/Time   LDLCALC 95 01/26/2020 12:00 AM   LDLDIRECT 47.0 11/05/2019 08:21 AM  Pharmacist Clinical Goal(s): Over the next 365 days, patient will work with PharmD and providers to achieve LDL goal < 70 Current regimen:  Simvastatin 80 mg tablet - take half tablet (40 mg) once daily Fenofibrate 160 mh once daily Interventions: Reviewed side effects/tolerability - none noted at this time Patient self care activities - Over the next 365 days, patient will: Continue current management  Diabetes Lab Results  Component Value Date/Time   HGBA1C 6.9 09/18/2020 12:00 AM  HGBA1C 7.4 07/10/2020 12:00 AM  Pharmacist Clinical Goal(s): Over the next 365 days, patient will work with PharmD and providers to maintain A1c goal <7% Current regimen:  Novolog flex pen 30 units three times daily with meals Lantus 65 units once every night Ozempic 0.5 mg once weekly  Interventions: Discussed signs of low blood sugar. Reviewed rule of 15 for low BG correction.  Patient self care activities - Over the next 365 days, patient will: Check blood sugar once daily, document, and provide at future appointments Contact provider with any episodes of hypoglycemia  Medication management Pharmacist Clinical Goal(s): Over the next 365 days, patient will work with PharmD and providers to maintain optimal medication adherence Current pharmacy: [facility]  Interventions Comprehensive  medication review performed. Continue current medication management strategy. Patient self care activities - Over the next 365 days, patient will: Take medications as prescribed Report any questions or concerns to PharmD and/or provider(s) Initial goal documentation.        This is a list of the screening recommended for you and due dates:  Health Maintenance  Topic Date Due   Colon Cancer Screening  02/26/2022   COVID-19 Vaccine (7 - 2023-24 season) 10/16/2022   Flu Shot  07/03/2023   Yearly kidney health urinalysis for diabetes  08/23/2023   Complete foot exam   08/23/2023   Hemoglobin A1C  09/25/2023   Eye exam for diabetics  02/26/2024   Yearly kidney function blood test for diabetes  03/25/2024   Medicare Annual Wellness Visit  05/18/2024   DTaP/Tdap/Td vaccine (3 - Td or Tdap) 08/31/2024   Pneumonia Vaccine  Completed   Hepatitis C Screening  Completed   Zoster (Shingles) Vaccine  Completed   HPV Vaccine  Aged Out    Advanced directives: copies in chart   Conditions/risks identified: stay healthy   Next appointment: Follow up in one year for your annual wellness visit.   Preventive Care 25 Years and Older, Male  Preventive care refers to lifestyle choices and visits with your health care provider that can promote health and wellness. What does preventive care include? A yearly physical exam. This is also called an annual well check. Dental exams once or twice a year. Routine eye exams. Ask your health care provider how often you should have your eyes checked. Personal lifestyle choices, including: Daily care of your teeth and gums. Regular physical activity. Eating a healthy diet. Avoiding tobacco and drug use. Limiting alcohol use. Practicing safe sex. Taking low doses of aspirin every day. Taking vitamin and mineral supplements as recommended by your health care provider. What happens during an annual well check? The services and screenings done by your  health care provider during your annual well check will depend on your age, overall health, lifestyle risk factors, and family history of disease. Counseling  Your health care provider may ask you questions about your: Alcohol use. Tobacco use. Drug use. Emotional well-being. Home and relationship well-being. Sexual activity. Eating habits. History of falls. Memory and ability to understand (cognition). Work and work Astronomer. Screening  You may have the following tests or measurements: Height, weight, and BMI. Blood pressure. Lipid and cholesterol levels. These may be checked every 5 years, or more frequently if you are over 74 years old. Skin check. Lung cancer screening. You may have this screening every year starting at age 38 if you have a 30-pack-year history of smoking and currently smoke or have quit within the past 15 years. Fecal  occult blood test (FOBT) of the stool. You may have this test every year starting at age 39. Flexible sigmoidoscopy or colonoscopy. You may have a sigmoidoscopy every 5 years or a colonoscopy every 10 years starting at age 92. Prostate cancer screening. Recommendations will vary depending on your family history and other risks. Hepatitis C blood test. Hepatitis B blood test. Sexually transmitted disease (STD) testing. Diabetes screening. This is done by checking your blood sugar (glucose) after you have not eaten for a while (fasting). You may have this done every 1-3 years. Abdominal aortic aneurysm (AAA) screening. You may need this if you are a current or former smoker. Osteoporosis. You may be screened starting at age 40 if you are at high risk. Talk with your health care provider about your test results, treatment options, and if necessary, the need for more tests. Vaccines  Your health care provider may recommend certain vaccines, such as: Influenza vaccine. This is recommended every year. Tetanus, diphtheria, and acellular pertussis  (Tdap, Td) vaccine. You may need a Td booster every 10 years. Zoster vaccine. You may need this after age 3. Pneumococcal 13-valent conjugate (PCV13) vaccine. One dose is recommended after age 90. Pneumococcal polysaccharide (PPSV23) vaccine. One dose is recommended after age 51. Talk to your health care provider about which screenings and vaccines you need and how often you need them. This information is not intended to replace advice given to you by your health care provider. Make sure you discuss any questions you have with your health care provider. Document Released: 12/15/2015 Document Revised: 08/07/2016 Document Reviewed: 09/19/2015 Elsevier Interactive Patient Education  2017 [employer].  Fall Prevention in the Home Falls can cause injuries. They can happen to people of all ages. There are many things you can do to make your home safe and to help prevent falls. What can I do on the outside of my home? Regularly fix the edges of walkways and driveways and fix any cracks. Remove anything that might make you trip as you walk through a door, such as a raised step or threshold. Trim any bushes or trees on the path to your home. Use bright outdoor lighting. Clear any walking paths of anything that might make someone trip, such as rocks or tools. Regularly check to see if handrails are loose or broken. Make sure that both sides of any steps have handrails. Any raised decks and porches should have guardrails on the edges. Have any leaves, snow, or ice cleared regularly. Use sand or salt on walking paths during winter. Clean up any spills in your garage right away. This includes oil or grease spills. What can I do in the bathroom? Use night lights. Install grab bars by the toilet and in the tub and shower. Do not use towel bars as grab bars. Use non-skid mats or decals in the tub or shower. If you need to sit down in the shower, use a plastic, non-slip stool. Keep the floor dry. Clean  up any water that spills on the floor as soon as it happens. Remove soap buildup in the tub or shower regularly. Attach bath mats securely with double-sided non-slip rug tape. Do not have throw rugs and other things on the floor that can make you trip. What can I do in the bedroom? Use night lights. Make sure that you have a light by your bed that is easy to reach. Do not use any sheets or blankets that are too big for your bed. They should  not hang down onto the floor. Have a firm chair that has side arms. You can use this for support while you get dressed. Do not have throw rugs and other things on the floor that can make you trip. What can I do in the kitchen? Clean up any spills right away. Avoid walking on wet floors. Keep items that you use a lot in easy-to-reach places. If you need to reach something above you, use a strong step stool that has a grab bar. Keep electrical cords out of the way. Do not use floor polish or wax that makes floors slippery. If you must use wax, use non-skid floor wax. Do not have throw rugs and other things on the floor that can make you trip. What can I do with my stairs? Do not leave any items on the stairs. Make sure that there are handrails on both sides of the stairs and use them. Fix handrails that are broken or loose. Make sure that handrails are as long as the stairways. Check any carpeting to make sure that it is firmly attached to the stairs. Fix any carpet that is loose or worn. Avoid having throw rugs at the top or bottom of the stairs. If you do have throw rugs, attach them to the floor with carpet tape. Make sure that you have a light switch at the top of the stairs and the bottom of the stairs. If you do not have them, ask someone to add them for you. What else can I do to help prevent falls? Wear shoes that: Do not have high heels. Have rubber bottoms. Are comfortable and fit you well. Are closed at the toe. Do not wear sandals. If you  use a stepladder: Make sure that it is fully opened. Do not climb a closed stepladder. Make sure that both sides of the stepladder are locked into place. Ask someone to hold it for you, if possible. Clearly mark and make sure that you can see: Any grab bars or handrails. First and last steps. Where the edge of each step is. Use tools that help you move around (mobility aids) if they are needed. These include: Canes. Walkers. Scooters. Crutches. Turn on the lights when you go into a dark area. Replace any light bulbs as soon as they burn out. Set up your furniture so you have a clear path. Avoid moving your furniture around. If any of your floors are uneven, fix them. If there are any pets around you, be aware of where they are. Review your medicines with your doctor. Some medicines can make you feel dizzy. This can increase your chance of falling. Ask your doctor what other things that you can do to help prevent falls. This information is not intended to replace advice given to you by your health care provider. Make sure you discuss any questions you have with your health care provider. Document Released: 09/14/2009 Document Revised: 04/25/2016 Document Reviewed: 12/23/2014 Elsevier Interactive Patient Education  2017 [employer].

## 2023-05-20 ENCOUNTER — Ambulatory Visit (INDEPENDENT_AMBULATORY_CARE_PROVIDER_SITE_OTHER): Payer: Medicare Other

## 2023-05-20 DIAGNOSIS — E538 Deficiency of other specified B group vitamins: Secondary | ICD-10-CM | POA: Diagnosis not present

## 2023-05-20 MED ORDER — CYANOCOBALAMIN 1000 MCG/ML IJ SOLN
1000.0000 ug | Freq: Once | INTRAMUSCULAR | Status: AC
Start: 2023-05-20 — End: 2023-05-20
  Administered 2023-05-20: 1000 ug via INTRAMUSCULAR

## 2023-05-20 NOTE — Progress Notes (Signed)
Pt tolerated b12 injection well. 

## 2023-05-30 ENCOUNTER — Encounter: Payer: Self-pay | Admitting: Neurology

## 2023-06-17 ENCOUNTER — Ambulatory Visit: Payer: Medicare Other

## 2023-06-17 DIAGNOSIS — E538 Deficiency of other specified B group vitamins: Secondary | ICD-10-CM | POA: Diagnosis not present

## 2023-06-17 MED ORDER — CYANOCOBALAMIN 1000 MCG/ML IJ SOLN
1000.0000 ug | Freq: Once | INTRAMUSCULAR | Status: AC
Start: 2023-06-17 — End: 2023-06-17
  Administered 2023-06-17: 1000 ug via INTRAMUSCULAR

## 2023-06-17 NOTE — Progress Notes (Signed)
Pt tolerated b12 well. 

## 2023-06-19 ENCOUNTER — Encounter (INDEPENDENT_AMBULATORY_CARE_PROVIDER_SITE_OTHER): Payer: Medicare Other | Admitting: Ophthalmology

## 2023-06-19 DIAGNOSIS — H43813 Vitreous degeneration, bilateral: Secondary | ICD-10-CM

## 2023-06-19 DIAGNOSIS — H35033 Hypertensive retinopathy, bilateral: Secondary | ICD-10-CM

## 2023-06-19 DIAGNOSIS — H353231 Exudative age-related macular degeneration, bilateral, with active choroidal neovascularization: Secondary | ICD-10-CM

## 2023-06-19 DIAGNOSIS — I10 Essential (primary) hypertension: Secondary | ICD-10-CM

## 2023-06-20 ENCOUNTER — Encounter: Payer: Self-pay | Admitting: Pharmacist

## 2023-06-20 NOTE — Progress Notes (Signed)
Patient previously followed by [facility] pharmacist. Per clinical review, no pharmacist appointment needed at this time.

## 2023-07-02 ENCOUNTER — Encounter (INDEPENDENT_AMBULATORY_CARE_PROVIDER_SITE_OTHER): Payer: Self-pay

## 2023-07-04 ENCOUNTER — Telehealth: Payer: Self-pay | Admitting: Family Medicine

## 2023-07-04 NOTE — Telephone Encounter (Signed)
Patient's wife called stating that pt is having continued sinus trouble possibly due to bone behind his ear. Caller requested a visit with Dr. Durene Cal on Monday stating that PCP informed them that he always has a spot open for them if they call. I informed caller that I would need approval of where to place them. I offered 8/9 @ 3 pm with PCP due to schedule but they declined. I informed them that approval would more than likely occur after office hours and recommended they see another provider. I was able to schedule them with Jarold Motto on 8/5 @ 10 am.

## 2023-07-07 ENCOUNTER — Ambulatory Visit: Payer: Medicare Other | Admitting: Physician Assistant

## 2023-07-07 ENCOUNTER — Encounter: Payer: Self-pay | Admitting: Physician Assistant

## 2023-07-07 ENCOUNTER — Encounter: Payer: Medicare Other | Admitting: Pharmacist

## 2023-07-07 VITALS — BP 130/70 | HR 63 | Temp 97.8°F | Ht 72.0 in | Wt 262.2 lb

## 2023-07-07 DIAGNOSIS — H7092 Unspecified mastoiditis, left ear: Secondary | ICD-10-CM | POA: Diagnosis not present

## 2023-07-07 MED ORDER — AMOXICILLIN-POT CLAVULANATE 875-125 MG PO TABS: 1.0000 | ORAL_TABLET | Freq: Two times a day (BID) | ORAL | 0 refills | Status: DC

## 2023-07-07 NOTE — Telephone Encounter (Signed)
FYI

## 2023-07-07 NOTE — Telephone Encounter (Signed)
Glad he was able to be seen today.  Lelon Mast always provides excellent care

## 2023-07-07 NOTE — Progress Notes (Signed)
[name] is a 82 y.o. male here for a follow up of a pre-existing problem.  History of Present Illness:   Chief Complaint  Patient presents with   Edema    Pt c/o swelling behind left ear, noticed on Saturday, tender to touch.    HPI  Mass (Left Ear): Complains of a mass behind his left ear that his wife noticed 5 days ago.  Notes that he has experienced similar symptoms before, but has never needed one to be incised.  Was treated by Dr Ruthine Dose in our office on 04/02/22 for this with Augmentin and was also treated about 12-13 years ago. Wife requesting repeat round of Augmentin.  Reports he has taken Tylenol, but has not putting anything on the area.  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 Thought to be agent orange related-CLL and prostate cancer  Diagnosis 1. 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 - NO EVIDENCE OF EXTRAPROSTATIC EXTENSION, ANGIOLYMPHATIC INVASION OR SEMINAL VESICLE INVOLVEMENT IDENTIFIED. - RESECTION MARGINS, NEGATIVE FOR ATYPIA OR MALIGNANCY. 2. Lymph nodes, regional resection, righ   Chronic pain syndrome 02/01/2016   Chronic right hip pain 09/27/2019   DDD (degenerative disc disease), lumbar    Diabetic retinopathy 09/24/2007   Diverticulosis of colon    Elevated PSA    Erectile dysfunction 12/30/2007   No rx.      GERD (gastroesophageal reflux disease)    Gynecomastia 12/28/2020   History of colonic polyps 08/22/2008   2004 3 adenomas 2009 none 2013 none 02/26/2017 8 mm sigmoid polyp was inflammatory - no recall planned given age/co-morbidities overall hx of polyps     History of prostate cancer  01/24/2014   Follows with Dr. Wilson Singer every 3 months. Dr. Clelia Croft every 6 months.  Diagnosis 1. 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 - NO EVIDENCE OF EXTRAPROSTATIC EXTENSION, ANGIOLYMPHATIC INVASION OR SEMINAL VESICLE INVOLVEMENT IDENTIFIED. - RESECTION MARGINS, NEGATIVE FOR ATYPIA OR MALIGNANCY. 2. Lymph nodes, regional resection, right pelvic - SIX   Hyperlipidemia associated with type 2 diabetes mellitus 09/24/2007   High triglycerides. Refused statin due to myalgias in past but then started half tablet of simvastatin 40mg  through [facility] of this note might be different from the original. Formatting of this note might be different from the original. High triglycerides. Refused statin due to myalgias in past but then started half tablet of simvastatin 40mg  through [place]  Last Assessment & Plan:  Formatting    Hypertension associated with diabetes 09/24/2007   Amlodipine 10mg , losartan 100mg , HCTZ 25mg , propranolol 40 mg twice a day Home cuff 142/85 compared to 134/72 on my check. Likely home cuff about 10 points higher.   --> chlorthalidone 03/20/16 but patient didn't change and then BP controlled on next visit   Formatting of this note might be different from the original. Formatting of this note might be different from the original. 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  Follow up in 6 months unless wound issues arise.  Derm follow up in October 2022 scheduled.  I will see him back in 6 months.   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 a1c under 8     Formatting of this note might be different from the original. Formatting of this note might be different from the original. And macular degeneration.   Last Assessment & Plan:  Formatting of this note might be different from the original. S: diabetic retinopathy followed up by optho yesterday and largely stable. He also has m   Vitamin D deficiency 11/01/2020   At [facility]- on 2000 units a day at least since 2021      Social History   Tobacco Use   Smoking status: Former    Current packs/day: 0.00    Average packs/day: 1 pack/day for 20.0 years (20.0 ttl pk-yrs)    Types: Cigarettes    Start date: 02/25/1961    Quit date: 02/25/1981    Years since quitting: 42.3   Smokeless tobacco: Never  Vaping Use   Vaping status: Never Used  Substance Use Topics   Alcohol use: Not Currently    Comment: stopped drinking 76 or 77    Drug use: No    Past Surgical History:  Procedure Laterality Date   CHOLECYSTECTOMY     COLONOSCOPY  03/25/12, 09/28/08   ESOPHAGOGASTRODUODENOSCOPY N/A 10/12/2014   Procedure: ESOPHAGOGASTRODUODENOSCOPY (EGD);  Surgeon: Iva Boop, MD;  Location: [facility];  Service: Endoscopy;  Laterality: N/A;   ESOPHAGOGASTRODUODENOSCOPY ENDOSCOPY  09/28/08   EXCISION MELANOMA WITH SENTINEL LYMPH NODE BIOPSY Right 05/31/2021   Procedure: WIDE LOCAL EXCISION RIGHT FOREARM WITH ADVANCEDMENT FLAP CLOSURE WITH SENTINEL LYMPH NODE MAPPING AND BIOSPY;  Surgeon: Almond Lint, MD;  Location: [facility];  Service: General;  Laterality: Right;   FOOT  SURGERY Bilateral    HEMORRHOID SURGERY     LAPAROSCOPIC GASTRIC BANDING  03/05/11   weight loss   LAPAROSCOPIC GASTRIC BANDING WITH HIATAL HERNIA REPAIR  03/05/2011   LYMPHADENECTOMY Bilateral 01/24/2014   Procedure: LYMPHADENECTOMY;  Surgeon: Crecencio Mc, MD;  Location: [facility];  Service: Urology;  Laterality: Bilateral;   ORIF TIBIA FRACTURE Right    PENILE PROSTHESIS IMPLANT     PROSTATE SURGERY  01/2014   ROBOT ASSISTED LAPAROSCOPIC RADICAL PROSTATECTOMY N/A 01/24/2014   Procedure: ROBOTIC ASSISTED LAPAROSCOPIC RADICAL PROSTATECTOMY LEVEL 3;  Surgeon: Crecencio Mc, MD;  Location: [facility];  Service: Urology;  Laterality: N/A;   SHOULDER SURGERY Right 2011   TONSILLECTOMY     age 15   VASECTOMY      Family History  Problem Relation Age of Onset   Hypertension Mother    Stomach cancer Mother 82   Alcohol abuse Brother    Diabetes Child  Hypertension Child    Hypertension Paternal Uncle    Heart attack Paternal Uncle    Parkinson's disease Other        several cousins and uncles   Colon cancer Neg Hx    Colon polyps Neg Hx    Rectal cancer Neg Hx     Allergies  Allergen Reactions   Morphine Sulfate Itching and Rash    Current Medications:   Current Outpatient Medications:    amLODipine (NORVASC) 10 MG tablet, Take 10 mg by mouth daily., Disp: , Rfl:    amoxicillin-clavulanate (AUGMENTIN) 875-125 MG tablet, Take 1 tablet by mouth 2 (two) times daily., Disp: 20 tablet, Rfl: 0   azelastine (ASTELIN) 0.1 % nasal spray, Place 1 spray into both nostrils 2 (two) times daily., Disp: , Rfl:    busPIRone (BUSPAR) 10 MG tablet, Take by mouth 3 (three) times daily., Disp: , Rfl:    carboxymethylcellulose 1 % ophthalmic solution, Apply 1 drop to eye 3 (three) times daily., Disp: , Rfl:    clobetasol cream (TEMOVATE) 0.05 %, Apply 1 Application topically 2 (two) times daily., Disp: , Rfl:    empagliflozin (JARDIANCE) 25 MG TABS tablet, Take 1 tablet (25 mg total) by mouth daily before  breakfast. ([facility]), Disp: 30 tablet, Rfl:    fenofibrate (TRICOR) 145 MG tablet, Take 160 mg by mouth daily., Disp: , Rfl:    fluticasone (FLONASE) 50 MCG/ACT nasal spray, Place into both nostrils., Disp: , Rfl:    gabapentin (NEURONTIN) 300 MG capsule, Take 600 mg by mouth at bedtime., Disp: , Rfl:    glucose 4 GM chewable tablet, Chew 1 tablet by mouth once. 15g., Disp: , Rfl:    glucose blood test strip, Use to test 4 times daily. E11.9, Disp: 100 each, Rfl: 12   hydrochlorothiazide (HYDRODIURIL) 25 MG tablet, Take 25 mg by mouth daily., Disp: , Rfl:    hydrocortisone 2.5 % cream, SMARTSIG:1 Topical Daily, Disp: , Rfl:    insulin aspart (NOVOLOG) 100 UNIT/ML injection, Inject 35 Units into the skin 3 (three) times daily with meals. When sugar is elevated, Disp: , Rfl:    insulin glargine (LANTUS) 100 UNIT/ML injection, Inject 50 Units into the skin at bedtime., Disp: , Rfl:    ketoconazole (NIZORAL) 2 % shampoo, Apply 1 Application topically 2 (two) times a week., Disp: , Rfl:    levothyroxine (SYNTHROID, LEVOTHROID) 50 MCG tablet, Take 50 mcg by mouth daily before breakfast., Disp: , Rfl:    losartan (COZAAR) 100 MG tablet, Take 100 mg by mouth daily., Disp: , Rfl:    mirtazapine (REMERON) 15 MG tablet, Take 15 mg by mouth at bedtime., Disp: , Rfl:    OVER THE COUNTER MEDICATION, PreserVision for eyes, Disp: , Rfl:    pantoprazole (PROTONIX) 40 MG tablet, Take 40 mg by mouth daily., Disp: , Rfl:    potassium chloride SA (K-DUR,KLOR-CON) 20 MEQ tablet, Take 20 mEq by mouth daily., Disp: , Rfl:    simvastatin (ZOCOR) 80 MG tablet, Take 40 mg by mouth daily at 6 PM. Take half tablet (40 mg) by mouth once daily, Disp: , Rfl:    sucralfate (CARAFATE) 1 G tablet, Take 1 g by mouth 4 (four) times daily -  with meals and at bedtime., Disp: , Rfl:    terbinafine (LAMISIL) 1 % cream, Apply 1 Application topically 2 (two) times daily., Disp: , Rfl:    traMADol (ULTRAM) 50 MG tablet, Take 50 mg by  mouth  every 6 (six) hours as needed., Disp: , Rfl:    trimethoprim-polymyxin b (POLYTRIM) ophthalmic solution, Apply to eye., Disp: , Rfl:    zolpidem (AMBIEN) 5 MG tablet, Take 5 mg by mouth at bedtime as needed for sleep., Disp: , Rfl:    Review of Systems:   ROS Negative unless otherwise specified per HPI.   Vitals:   Vitals:   07/07/23 1011  BP: 130/70  Pulse: 63  Temp: 97.8 F (36.6 C)  TempSrc: Temporal  SpO2: 95%  Weight: 262 lb 4 oz (119 kg)  Height: 6' (1.829 m)     Body mass index is 35.57 kg/m.  Physical Exam:   Physical Exam Vitals and nursing note reviewed.  Constitutional:      Appearance: He is well-developed.  HENT:     Head: Normocephalic.     Right Ear: Tympanic membrane, ear canal and external ear normal. Tympanic membrane is not perforated, erythematous or retracted.     Left Ear: Tympanic membrane and ear canal normal. There is mastoid tenderness (very slight tenderness and swelling). Tympanic membrane is not perforated, erythematous or retracted.  Eyes:     Conjunctiva/sclera: Conjunctivae normal.     Pupils: Pupils are equal, round, and reactive to light.  Pulmonary:     Effort: Pulmonary effort is normal.  Musculoskeletal:        General: Normal range of motion.     Cervical back: Normal range of motion.  Skin:    General: Skin is warm and dry.  Neurological:     Mental Status: He is alert and oriented to person, place, and time.  Psychiatric:        Behavior: Behavior normal.        Thought Content: Thought content normal.        Judgment: Judgment normal.     Assessment and Plan:   Mastoiditis of left side No red flags suggesting of systemic illness Start Augmentin antibiotic(s)  Low threshold to contact us if any concerns of lack of improvement or new symptom(s)   I,Emily Lagle,acting as a scribe for [employer], PA.,have documented all relevant documentation on the behalf of Jarold Motto, PA,as directed by  Jarold Motto, PA while in the presence of Jarold Motto, [place].  I, Jarold Motto, [place], have reviewed all documentation for this visit. The documentation on 07/07/23 for the exam, diagnosis, procedures, and orders are all accurate and complete.  Jarold Motto, PA-C

## 2023-07-14 DIAGNOSIS — L814 Other melanin hyperpigmentation: Secondary | ICD-10-CM | POA: Diagnosis not present

## 2023-07-14 DIAGNOSIS — D225 Melanocytic nevi of trunk: Secondary | ICD-10-CM | POA: Diagnosis not present

## 2023-07-14 DIAGNOSIS — L821 Other seborrheic keratosis: Secondary | ICD-10-CM | POA: Diagnosis not present

## 2023-07-14 DIAGNOSIS — D485 Neoplasm of uncertain behavior of skin: Secondary | ICD-10-CM | POA: Diagnosis not present

## 2023-07-14 DIAGNOSIS — Z08 Encounter for follow-up examination after completed treatment for malignant neoplasm: Secondary | ICD-10-CM | POA: Diagnosis not present

## 2023-07-14 DIAGNOSIS — Z85828 Personal history of other malignant neoplasm of skin: Secondary | ICD-10-CM | POA: Diagnosis not present

## 2023-07-17 ENCOUNTER — Encounter (INDEPENDENT_AMBULATORY_CARE_PROVIDER_SITE_OTHER): Payer: Self-pay

## 2023-07-17 ENCOUNTER — Ambulatory Visit (INDEPENDENT_AMBULATORY_CARE_PROVIDER_SITE_OTHER): Payer: Medicare Other

## 2023-07-17 DIAGNOSIS — E538 Deficiency of other specified B group vitamins: Secondary | ICD-10-CM

## 2023-07-17 MED ORDER — CYANOCOBALAMIN 1000 MCG/ML IJ SOLN
1000.0000 ug | Freq: Once | INTRAMUSCULAR | Status: AC
Start: 2023-07-17 — End: 2023-07-17
  Administered 2023-07-17: 1000 ug via INTRAMUSCULAR

## 2023-07-17 NOTE — Progress Notes (Signed)
Pt tolerated b12 injection well. 

## 2023-08-02 DIAGNOSIS — Z23 Encounter for immunization: Secondary | ICD-10-CM | POA: Diagnosis not present

## 2023-08-07 ENCOUNTER — Encounter (INDEPENDENT_AMBULATORY_CARE_PROVIDER_SITE_OTHER): Payer: Medicare Other | Admitting: Ophthalmology

## 2023-08-07 DIAGNOSIS — H35033 Hypertensive retinopathy, bilateral: Secondary | ICD-10-CM

## 2023-08-07 DIAGNOSIS — H43813 Vitreous degeneration, bilateral: Secondary | ICD-10-CM

## 2023-08-07 DIAGNOSIS — I10 Essential (primary) hypertension: Secondary | ICD-10-CM

## 2023-08-07 DIAGNOSIS — Z23 Encounter for immunization: Secondary | ICD-10-CM | POA: Diagnosis not present

## 2023-08-07 DIAGNOSIS — H353231 Exudative age-related macular degeneration, bilateral, with active choroidal neovascularization: Secondary | ICD-10-CM | POA: Diagnosis not present

## 2023-08-08 ENCOUNTER — Inpatient Hospital Stay (HOSPITAL_COMMUNITY)
Admission: EM | Admit: 2023-08-08 | Discharge: 2023-08-10 | DRG: 948 | Disposition: A | Discharge status: Home Health | Payer: Medicare Other | Attending: Internal Medicine | Admitting: Internal Medicine

## 2023-08-08 ENCOUNTER — Emergency Department (HOSPITAL_COMMUNITY): Payer: Medicare Other

## 2023-08-08 ENCOUNTER — Encounter (HOSPITAL_COMMUNITY): Payer: Self-pay | Admitting: Emergency Medicine

## 2023-08-08 ENCOUNTER — Other Ambulatory Visit: Payer: Self-pay

## 2023-08-08 DIAGNOSIS — I499 Cardiac arrhythmia, unspecified: Secondary | ICD-10-CM | POA: Diagnosis not present

## 2023-08-08 DIAGNOSIS — K219 Gastro-esophageal reflux disease without esophagitis: Secondary | ICD-10-CM | POA: Diagnosis present

## 2023-08-08 DIAGNOSIS — E039 Hypothyroidism, unspecified: Secondary | ICD-10-CM | POA: Diagnosis present

## 2023-08-08 DIAGNOSIS — N182 Chronic kidney disease, stage 2 (mild): Secondary | ICD-10-CM | POA: Diagnosis not present

## 2023-08-08 DIAGNOSIS — E781 Pure hyperglyceridemia: Secondary | ICD-10-CM | POA: Diagnosis present

## 2023-08-08 DIAGNOSIS — F4310 Post-traumatic stress disorder, unspecified: Secondary | ICD-10-CM | POA: Diagnosis present

## 2023-08-08 DIAGNOSIS — I10 Essential (primary) hypertension: Secondary | ICD-10-CM | POA: Diagnosis not present

## 2023-08-08 DIAGNOSIS — Z8546 Personal history of malignant neoplasm of prostate: Secondary | ICD-10-CM

## 2023-08-08 DIAGNOSIS — E1169 Type 2 diabetes mellitus with other specified complication: Secondary | ICD-10-CM | POA: Diagnosis present

## 2023-08-08 DIAGNOSIS — Z800 Family history of malignant neoplasm of digestive organs: Secondary | ICD-10-CM

## 2023-08-08 DIAGNOSIS — I447 Left bundle-branch block, unspecified: Secondary | ICD-10-CM | POA: Diagnosis present

## 2023-08-08 DIAGNOSIS — D72829 Elevated white blood cell count, unspecified: Secondary | ICD-10-CM | POA: Diagnosis present

## 2023-08-08 DIAGNOSIS — M5136 Other intervertebral disc degeneration, lumbar region: Secondary | ICD-10-CM | POA: Diagnosis present

## 2023-08-08 DIAGNOSIS — Z833 Family history of diabetes mellitus: Secondary | ICD-10-CM

## 2023-08-08 DIAGNOSIS — R509 Fever, unspecified: Principal | ICD-10-CM

## 2023-08-08 DIAGNOSIS — Z85828 Personal history of other malignant neoplasm of skin: Secondary | ICD-10-CM

## 2023-08-08 DIAGNOSIS — C9110 Chronic lymphocytic leukemia of B-cell type not having achieved remission: Secondary | ICD-10-CM | POA: Diagnosis present

## 2023-08-08 DIAGNOSIS — R531 Weakness: Secondary | ICD-10-CM | POA: Diagnosis not present

## 2023-08-08 DIAGNOSIS — I4892 Unspecified atrial flutter: Secondary | ICD-10-CM | POA: Diagnosis present

## 2023-08-08 DIAGNOSIS — E11319 Type 2 diabetes mellitus with unspecified diabetic retinopathy without macular edema: Secondary | ICD-10-CM | POA: Diagnosis not present

## 2023-08-08 DIAGNOSIS — F329 Major depressive disorder, single episode, unspecified: Secondary | ICD-10-CM | POA: Diagnosis present

## 2023-08-08 DIAGNOSIS — E1122 Type 2 diabetes mellitus with diabetic chronic kidney disease: Secondary | ICD-10-CM | POA: Diagnosis present

## 2023-08-08 DIAGNOSIS — Z794 Long term (current) use of insulin: Secondary | ICD-10-CM

## 2023-08-08 DIAGNOSIS — I152 Hypertension secondary to endocrine disorders: Secondary | ICD-10-CM | POA: Diagnosis present

## 2023-08-08 DIAGNOSIS — G4733 Obstructive sleep apnea (adult) (pediatric): Secondary | ICD-10-CM | POA: Diagnosis not present

## 2023-08-08 DIAGNOSIS — Z9884 Bariatric surgery status: Secondary | ICD-10-CM

## 2023-08-08 DIAGNOSIS — E559 Vitamin D deficiency, unspecified: Secondary | ICD-10-CM | POA: Diagnosis present

## 2023-08-08 DIAGNOSIS — G510 Bell's palsy: Secondary | ICD-10-CM | POA: Diagnosis present

## 2023-08-08 DIAGNOSIS — F0670 Mild neurocognitive disorder due to known physiological condition without behavioral disturbance: Secondary | ICD-10-CM | POA: Diagnosis present

## 2023-08-08 DIAGNOSIS — G894 Chronic pain syndrome: Secondary | ICD-10-CM | POA: Diagnosis present

## 2023-08-08 DIAGNOSIS — Z9089 Acquired absence of other organs: Secondary | ICD-10-CM

## 2023-08-08 DIAGNOSIS — H3530 Unspecified macular degeneration: Secondary | ICD-10-CM | POA: Diagnosis present

## 2023-08-08 DIAGNOSIS — R6510 Systemic inflammatory response syndrome (SIRS) of non-infectious origin without acute organ dysfunction: Principal | ICD-10-CM | POA: Diagnosis present

## 2023-08-08 DIAGNOSIS — I4891 Unspecified atrial fibrillation: Secondary | ICD-10-CM | POA: Diagnosis present

## 2023-08-08 DIAGNOSIS — T50B95A Adverse effect of other viral vaccines, initial encounter: Secondary | ICD-10-CM | POA: Diagnosis present

## 2023-08-08 DIAGNOSIS — M47816 Spondylosis without myelopathy or radiculopathy, lumbar region: Secondary | ICD-10-CM | POA: Diagnosis present

## 2023-08-08 DIAGNOSIS — Z9889 Other specified postprocedural states: Secondary | ICD-10-CM

## 2023-08-08 DIAGNOSIS — N179 Acute kidney failure, unspecified: Secondary | ICD-10-CM | POA: Diagnosis not present

## 2023-08-08 DIAGNOSIS — G20A1 Parkinson's disease without dyskinesia, without mention of fluctuations: Secondary | ICD-10-CM | POA: Diagnosis not present

## 2023-08-08 DIAGNOSIS — Z85820 Personal history of malignant melanoma of skin: Secondary | ICD-10-CM

## 2023-08-08 DIAGNOSIS — Z87891 Personal history of nicotine dependence: Secondary | ICD-10-CM

## 2023-08-08 DIAGNOSIS — Z885 Allergy status to narcotic agent status: Secondary | ICD-10-CM

## 2023-08-08 DIAGNOSIS — G4700 Insomnia, unspecified: Secondary | ICD-10-CM | POA: Diagnosis present

## 2023-08-08 DIAGNOSIS — Z9049 Acquired absence of other specified parts of digestive tract: Secondary | ICD-10-CM

## 2023-08-08 DIAGNOSIS — Z8719 Personal history of other diseases of the digestive system: Secondary | ICD-10-CM

## 2023-08-08 DIAGNOSIS — Z7984 Long term (current) use of oral hypoglycemic drugs: Secondary | ICD-10-CM

## 2023-08-08 DIAGNOSIS — E119 Type 2 diabetes mellitus without complications: Secondary | ICD-10-CM

## 2023-08-08 DIAGNOSIS — K589 Irritable bowel syndrome without diarrhea: Secondary | ICD-10-CM | POA: Diagnosis present

## 2023-08-08 DIAGNOSIS — Z8249 Family history of ischemic heart disease and other diseases of the circulatory system: Secondary | ICD-10-CM

## 2023-08-08 DIAGNOSIS — Z1152 Encounter for screening for COVID-19: Secondary | ICD-10-CM | POA: Diagnosis not present

## 2023-08-08 DIAGNOSIS — R5381 Other malaise: Secondary | ICD-10-CM | POA: Diagnosis present

## 2023-08-08 DIAGNOSIS — R5083 Postvaccination fever: Secondary | ICD-10-CM | POA: Diagnosis present

## 2023-08-08 DIAGNOSIS — Z85821 Personal history of Merkel cell carcinoma: Secondary | ICD-10-CM

## 2023-08-08 DIAGNOSIS — R001 Bradycardia, unspecified: Secondary | ICD-10-CM | POA: Diagnosis not present

## 2023-08-08 DIAGNOSIS — Z820 Family history of epilepsy and other diseases of the nervous system: Secondary | ICD-10-CM

## 2023-08-08 DIAGNOSIS — Z79899 Other long term (current) drug therapy: Secondary | ICD-10-CM

## 2023-08-08 DIAGNOSIS — Z811 Family history of alcohol abuse and dependence: Secondary | ICD-10-CM

## 2023-08-08 DIAGNOSIS — Z960 Presence of urogenital implants: Secondary | ICD-10-CM | POA: Diagnosis present

## 2023-08-08 DIAGNOSIS — Z79890 Hormone replacement therapy (postmenopausal): Secondary | ICD-10-CM

## 2023-08-08 LAB — BASIC METABOLIC PANEL
Anion gap: 17 — ABNORMAL HIGH (ref 5–15)
BUN: 13 mg/dL (ref 8–23)
CO2: 18 mmol/L — ABNORMAL LOW (ref 22–32)
Calcium: 8.6 mg/dL — ABNORMAL LOW (ref 8.9–10.3)
Chloride: 101 mmol/L (ref 98–111)
Creatinine, Ser: 1.35 mg/dL — ABNORMAL HIGH (ref 0.61–1.24)
GFR, Estimated: 53 mL/min — ABNORMAL LOW (ref >60)
Glucose, Bld: 173 mg/dL — ABNORMAL HIGH (ref 70–99)
Potassium: 4 mmol/L (ref 3.5–5.1)
Sodium: 136 mmol/L (ref 135–145)

## 2023-08-08 LAB — CBC
HCT: 43.5 % (ref 39.0–52.0)
Hemoglobin: 14.3 g/dL (ref 13.0–17.0)
MCH: 27.7 pg (ref 26.0–34.0)
MCHC: 32.9 g/dL (ref 30.0–36.0)
MCV: 84.1 fL (ref 80.0–100.0)
Platelets: 172 10*3/uL (ref 150–400)
RBC: 5.17 MIL/uL (ref 4.22–5.81)
RDW: 14.9 % (ref 11.5–15.5)
WBC: 46.5 10*3/uL — ABNORMAL HIGH (ref 4.0–10.5)
nRBC: 0 % (ref 0.0–0.2)

## 2023-08-08 LAB — PROTIME-INR
INR: 1.2 (ref 0.8–1.2)
Prothrombin Time: 15.5 s — ABNORMAL HIGH (ref 11.4–15.2)

## 2023-08-08 LAB — SARS CORONAVIRUS 2 BY RT PCR: SARS Coronavirus 2 by RT PCR: NEGATIVE

## 2023-08-08 LAB — LACTIC ACID, PLASMA: Lactic Acid, Venous: 2.6 mmol/L (ref 0.5–1.9)

## 2023-08-08 LAB — TROPONIN I (HIGH SENSITIVITY)
Troponin I (High Sensitivity): 13 ng/L (ref <18)
Troponin I (High Sensitivity): 15 ng/L (ref <18)

## 2023-08-08 MED ORDER — INSULIN ASPART 100 UNIT/ML IJ SOLN
10.0000 [IU] | Freq: Three times a day (TID) | INTRAMUSCULAR | Status: DC
  Administered 2023-08-09 – 2023-08-10 (×4): 10 [IU] via SUBCUTANEOUS

## 2023-08-08 MED ORDER — TRAMADOL HCL 50 MG PO TABS: 50.0000 mg | ORAL_TABLET | Freq: Four times a day (QID) | ORAL | Status: DC | PRN

## 2023-08-08 MED ORDER — GABAPENTIN 300 MG PO CAPS
600.0000 mg | ORAL_CAPSULE | Freq: Three times a day (TID) | ORAL | Status: DC
  Administered 2023-08-09 – 2023-08-10 (×5): 600 mg via ORAL
  Filled 2023-08-08 (×5): qty 2

## 2023-08-08 MED ORDER — ACETAMINOPHEN 650 MG RE SUPP: 650.0000 mg | Freq: Four times a day (QID) | RECTAL | Status: DC | PRN

## 2023-08-08 MED ORDER — MIRTAZAPINE 15 MG PO TABS
15.0000 mg | ORAL_TABLET | Freq: Every day | ORAL | Status: DC
  Administered 2023-08-09 (×2): 15 mg via ORAL
  Filled 2023-08-08 (×2): qty 1

## 2023-08-08 MED ORDER — SODIUM CHLORIDE 0.9 % IV SOLN
2.0000 g | Freq: Once | INTRAVENOUS | Status: AC
  Administered 2023-08-09: 2 g via INTRAVENOUS
  Filled 2023-08-08: qty 12.5

## 2023-08-08 MED ORDER — LEVOTHYROXINE SODIUM 50 MCG PO TABS
50.0000 ug | ORAL_TABLET | Freq: Every day | ORAL | Status: DC
  Administered 2023-08-09 – 2023-08-10 (×2): 50 ug via ORAL
  Filled 2023-08-08 (×2): qty 1

## 2023-08-08 MED ORDER — ENOXAPARIN SODIUM 40 MG/0.4ML IJ SOSY
40.0000 mg | PREFILLED_SYRINGE | Freq: Every day | INTRAMUSCULAR | Status: DC
  Administered 2023-08-09 – 2023-08-10 (×2): 40 mg via SUBCUTANEOUS
  Filled 2023-08-08 (×2): qty 0.4

## 2023-08-08 MED ORDER — VANCOMYCIN HCL 2000 MG/400ML IV SOLN
2000.0000 mg | Freq: Once | INTRAVENOUS | Status: AC
  Administered 2023-08-09: 2000 mg via INTRAVENOUS
  Filled 2023-08-08: qty 400

## 2023-08-08 MED ORDER — SODIUM CHLORIDE 0.9 % IV SOLN
1.0000 g | Freq: Once | INTRAVENOUS | Status: AC
  Administered 2023-08-08: 1 g via INTRAVENOUS
  Filled 2023-08-08: qty 10

## 2023-08-08 MED ORDER — SODIUM CHLORIDE 0.9% FLUSH
3.0000 mL | Freq: Two times a day (BID) | INTRAVENOUS | Status: DC
  Administered 2023-08-09 – 2023-08-10 (×4): 3 mL via INTRAVENOUS

## 2023-08-08 MED ORDER — SODIUM CHLORIDE 0.9 % IV BOLUS
500.0000 mL | Freq: Once | INTRAVENOUS | Status: AC
  Administered 2023-08-08: 500 mL via INTRAVENOUS

## 2023-08-08 MED ORDER — ACETAMINOPHEN 325 MG PO TABS: 650.0000 mg | ORAL_TABLET | Freq: Four times a day (QID) | ORAL | Status: DC | PRN

## 2023-08-08 MED ORDER — INSULIN ASPART 100 UNIT/ML IJ SOLN
0.0000 [IU] | Freq: Three times a day (TID) | INTRAMUSCULAR | Status: DC
  Administered 2023-08-09: 1 [IU] via SUBCUTANEOUS
  Administered 2023-08-09 – 2023-08-10 (×2): 2 [IU] via SUBCUTANEOUS

## 2023-08-08 MED ORDER — ATORVASTATIN CALCIUM 40 MG PO TABS
40.0000 mg | ORAL_TABLET | Freq: Every day | ORAL | Status: DC
  Administered 2023-08-09 – 2023-08-10 (×2): 40 mg via ORAL
  Filled 2023-08-08 (×2): qty 1

## 2023-08-08 MED ORDER — SENNOSIDES-DOCUSATE SODIUM 8.6-50 MG PO TABS: 1.0000 | ORAL_TABLET | Freq: Every evening | ORAL | Status: DC | PRN

## 2023-08-08 MED ORDER — SUCRALFATE 1 G PO TABS
1.0000 g | ORAL_TABLET | Freq: Three times a day (TID) | ORAL | Status: DC
  Administered 2023-08-09 – 2023-08-10 (×6): 1 g via ORAL
  Filled 2023-08-08 (×6): qty 1

## 2023-08-08 MED ORDER — LACTATED RINGERS IV SOLN: INTRAVENOUS | Status: DC

## 2023-08-08 MED ORDER — AMLODIPINE BESYLATE 10 MG PO TABS
10.0000 mg | ORAL_TABLET | Freq: Every day | ORAL | Status: DC
  Administered 2023-08-09 – 2023-08-10 (×2): 10 mg via ORAL
  Filled 2023-08-08 (×2): qty 1

## 2023-08-08 MED ORDER — TERBINAFINE HCL 1 % EX CREA
1.0000 | TOPICAL_CREAM | Freq: Two times a day (BID) | CUTANEOUS | Status: DC
  Administered 2023-08-09 (×3): 1 via TOPICAL
  Filled 2023-08-08: qty 12

## 2023-08-08 MED ORDER — ZOLPIDEM TARTRATE 5 MG PO TABS: 5.0000 mg | ORAL_TABLET | Freq: Every evening | ORAL | Status: DC | PRN

## 2023-08-08 MED ORDER — ACETAMINOPHEN 325 MG PO TABS
650.0000 mg | ORAL_TABLET | Freq: Once | ORAL | Status: AC
  Administered 2023-08-08: 650 mg via ORAL
  Filled 2023-08-08: qty 2

## 2023-08-08 MED ORDER — VANCOMYCIN HCL IN DEXTROSE 1-5 GM/200ML-% IV SOLN: 1000.0000 mg | Freq: Once | INTRAVENOUS | Status: DC

## 2023-08-08 MED ORDER — METRONIDAZOLE 500 MG/100ML IV SOLN
500.0000 mg | Freq: Two times a day (BID) | INTRAVENOUS | Status: DC
  Administered 2023-08-09: 500 mg via INTRAVENOUS
  Filled 2023-08-08: qty 100

## 2023-08-08 MED ORDER — INSULIN GLARGINE-YFGN 100 UNIT/ML ~~LOC~~ SOLN
35.0000 [IU] | Freq: Every day | SUBCUTANEOUS | Status: DC
  Administered 2023-08-09 (×2): 35 [IU] via SUBCUTANEOUS
  Filled 2023-08-08 (×5): qty 0.35

## 2023-08-08 MED ORDER — BUSPIRONE HCL 10 MG PO TABS
10.0000 mg | ORAL_TABLET | Freq: Three times a day (TID) | ORAL | Status: DC
  Administered 2023-08-09 – 2023-08-10 (×5): 10 mg via ORAL
  Filled 2023-08-08 (×5): qty 1

## 2023-08-08 MED ORDER — INSULIN ASPART 100 UNIT/ML IJ SOLN: 0.0000 [IU] | Freq: Every day | INTRAMUSCULAR | Status: DC

## 2023-08-08 MED ORDER — NEOMYCIN-POLYMYXIN-DEXAMETH 3.5-10000-0.1 OP SUSP
1.0000 [drp] | Freq: Three times a day (TID) | OPHTHALMIC | Status: DC
  Administered 2023-08-09 (×4): 1 [drp] via OPHTHALMIC
  Filled 2023-08-08: qty 5

## 2023-08-08 NOTE — ED Provider Notes (Incomplete)
[place] EMERGENCY DEPARTMENT AT [facility] Provider Note   CSN: [id number] Arrival date & time: 08/08/23  1822     History {Add pertinent medical, surgical, social history, OB history to HPI:1} Chief Complaint  Patient presents with   Atrial Fibrillation    [name] is a 82 y.o. male.   Atrial Fibrillation       Home Medications Prior to Admission medications   Medication Sig Start Date End Date Taking? Authorizing Provider  amLODipine (NORVASC) 10 MG tablet Take 10 mg by mouth daily. 09/18/20   [provider]  amoxicillin-clavulanate (AUGMENTIN) 875-125 MG tablet Take 1 tablet by mouth 2 (two) times daily. 07/07/23   Jarold Motto, PA  azelastine (ASTELIN) 0.1 % nasal spray Place 1 spray into both nostrils 2 (two) times daily. 03/25/23   [provider]  busPIRone (BUSPAR) 10 MG tablet Take by mouth 3 (three) times daily. 11/09/21   [provider]  carboxymethylcellulose 1 % ophthalmic solution Apply 1 drop to eye 3 (three) times daily.    [provider]  clobetasol cream (TEMOVATE) 0.05 % Apply 1 Application topically 2 (two) times daily.    [provider]  empagliflozin (JARDIANCE) 25 MG TABS tablet Take 1 tablet (25 mg total) by mouth daily before breakfast. ([facility]) 06/11/22   Shelva Majestic, MD  fenofibrate (TRICOR) 145 MG tablet Take 160 mg by mouth daily.    [provider]  fluticasone (FLONASE) 50 MCG/ACT nasal spray Place into both nostrils. 12/24/20   [provider]  gabapentin (NEURONTIN) 300 MG capsule Take 600 mg by mouth at bedtime. 02/21/22   [provider]  glucose 4 GM chewable tablet Chew 1 tablet by mouth once. 15g. 12/24/20   [provider]  glucose blood test strip Use to test 4 times daily. E11.9 10/26/19   Shelva Majestic, MD  hydrochlorothiazide (HYDRODIURIL) 25 MG tablet Take 25 mg by mouth daily.    [provider]   hydrocortisone 2.5 % cream SMARTSIG:1 Topical Daily 12/13/21   [provider]  insulin aspart (NOVOLOG) 100 UNIT/ML injection Inject 35 Units into the skin 3 (three) times daily with meals. When sugar is elevated    [provider]  insulin glargine (LANTUS) 100 UNIT/ML injection Inject 50 Units into the skin at bedtime.    [provider]  ketoconazole (NIZORAL) 2 % shampoo Apply 1 Application topically 2 (two) times a week.    [provider]  levothyroxine (SYNTHROID, LEVOTHROID) 50 MCG tablet Take 50 mcg by mouth daily before breakfast.    [provider]  losartan (COZAAR) 100 MG tablet Take 100 mg by mouth daily.    [provider]  mirtazapine (REMERON) 15 MG tablet Take 15 mg by mouth at bedtime.    [provider]  OVER THE COUNTER MEDICATION PreserVision for eyes    [provider]  pantoprazole (PROTONIX) 40 MG tablet Take 40 mg by mouth daily.    [provider]  potassium chloride SA (K-DUR,KLOR-CON) 20 MEQ tablet Take 20 mEq by mouth daily.    [provider]  simvastatin (ZOCOR) 80 MG tablet Take 40 mg by mouth daily at 6 PM. Take half tablet (40 mg) by mouth once daily 09/18/20   [provider]  sucralfate (CARAFATE) 1 G tablet Take 1 g by mouth 4 (four) times daily -  with meals and at bedtime.    [provider]  terbinafine (  LAMISIL) 1 % cream Apply 1 Application topically 2 (two) times daily.    [provider]  traMADol (ULTRAM) 50 MG tablet Take 50 mg by mouth every 6 (six) hours as needed.    [provider]  trimethoprim-polymyxin b (POLYTRIM) ophthalmic solution Apply to eye. 11/05/21   [provider]  zolpidem (AMBIEN) 5 MG tablet Take 5 mg by mouth at bedtime as needed for sleep.    [provider]      Allergies    Morphine sulfate    Review of Systems   Review of Systems  Physical Exam Updated Vital Signs BP 136/63  (BP Location: Right Arm)   Pulse 95   Temp 99.9 F (37.7 C) (Oral)   Resp 20   SpO2 93%  Physical Exam  ED Results / Procedures / Treatments   Labs (all labs ordered are listed, but only abnormal results are displayed) Labs Reviewed  CBC - Abnormal; Notable for the following components:      Result Value   WBC 46.5 (*)    All other components within normal limits  BASIC METABOLIC PANEL  PROTIME-INR  TROPONIN I (HIGH SENSITIVITY)    EKG EKG Interpretation Date/Time:  Friday August 08 2023 18:30:55 EDT Ventricular Rate:  96 PR Interval:  196 QRS Duration:  130 QT Interval:  390 QTC Calculation: 492 R Axis:   -39  Text Interpretation: Normal sinus rhythm with sinus arrhythmia Left axis deviation Left bundle branch block Abnormal ECG When compared with ECG of 29-May-2021 07:14, PREVIOUS ECG IS PRESENT Left bundle branch block is new compared wtih prior Confirmed by Eber Hong ([id number]) on 08/08/2023 6:50:47 PM  Radiology No results found.  Procedures Procedures  {Document cardiac monitor, telemetry assessment procedure when appropriate:1}  Medications Ordered in ED Medications - No data to display  ED Course/ Medical Decision Making/ A&P   {   Click here for ABCD2, HEART and other calculatorsREFRESH Note before signing :1}                              Medical Decision Making Amount and/or Complexity of Data Reviewed Labs: ordered. Radiology: ordered.   ***  {Document critical care time when appropriate:1} {Document review of labs and clinical decision tools ie heart score, Chads2Vasc2 etc:1}  {Document your independent review of radiology images, and any outside records:1} {Document your discussion with family members, caretakers, and with consultants:1} {Document social determinants of health affecting pt's care:1} {Document your decision making why or why not admission, treatments were needed:1} Final Clinical Impression(s) / ED Diagnoses Final  diagnoses:  None    Rx / DC Orders ED Discharge Orders     None

## 2023-08-08 NOTE — Progress Notes (Incomplete)
Pharmacy Antibiotic Note  CARSON PRYOR is a 82 y.o. male admitted on 08/08/2023 with concern for sepsis.  Pharmacy has been consulted for cefepime and vancomycin dosing.  Plan: Vancomycin 2000mg  x 1 load followed by      Temp (24hrs), Avg:100 F (37.8 C), Min:98.8 F (37.1 C), Max:100.9 F (38.3 C)  Recent Labs  Lab 08/08/23 1830 08/08/23 2010  WBC 46.5*  --   CREATININE 1.35*  --   LATICACIDVEN  --  2.6*    CrCl cannot be calculated (Unknown ideal weight.).    Allergies  Allergen Reactions  . Morphine Sulfate Itching and Rash    Antimicrobials this admission: Vancomycin 9/6 > Cefepime 9/6 > Ceftriaxone 9/6 x 1  Dose adjustments this admission: ***  Microbiology results: *** BCx: *** *** UCx: ***  *** Sputum: ***  *** MRSA PCR: ***  Thank you for allowing pharmacy to be a part of this patient's care.  Marja Kays 08/08/2023 11:56 PM

## 2023-08-08 NOTE — H&P (Incomplete)
History and Physical    [name]:[id number] DOB: 12-Mar-1941 DOA: 08/08/2023  PCP: Shelva Majestic, MD   Patient coming from: Home   Chief Complaint: General weakness, fever   HPI: [name] is a pleasant 82 y.o. male with medical history significant for hypertension, insulin-dependent diabetes mellitus, CLL under active surveillance, Parkinson disease OSA, and history of prostate cancer who presents with  ED Course: Upon arrival to the ED, patient is found to be ***  Review of Systems:  All other systems reviewed and apart from HPI, are negative.  Past Medical History:  Diagnosis Date  . Arthritis   . Basal cell carcinoma 03/18/2017   left neck CX3 5FU  . Bell's palsy 05/08/2020  . Cataract   . Chronic bilateral low back pain without sciatica 12/12/2015  . 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 Thought to be agent orange related-CLL and prostate cancer  Diagnosis 1. 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 - NO EVIDENCE OF EXTRAPROSTATIC EXTENSION, ANGIOLYMPHATIC INVASION OR SEMINAL VESICLE INVOLVEMENT IDENTIFIED. - RESECTION MARGINS, NEGATIVE FOR ATYPIA OR MALIGNANCY. 2. Lymph nodes, regional resection, righ  . Chronic pain syndrome 02/01/2016  . Chronic right hip pain 09/27/2019  . DDD (degenerative disc disease), lumbar   . Diabetic retinopathy 09/24/2007  . Diverticulosis of colon   . Elevated PSA   . Erectile dysfunction 12/30/2007   No rx.     Marland Kitchen GERD (gastroesophageal reflux disease)   . Gynecomastia 12/28/2020  . History of colonic polyps 08/22/2008   2004 3 adenomas 2009 none 2013 none 02/26/2017 8 mm sigmoid polyp was inflammatory - no recall planned given age/co-morbidities overall hx of polyps    . History of prostate cancer 01/24/2014   Follows with Dr. Wilson Singer every 3 months. Dr. Clelia Croft every 6 months.  Diagnosis 1. Prostate, radical resection - PROSTATIC  ADENOCARCINOMA, GLEASON'S SCORE 3+4=7, INVOLVING BOTH LOBES. - NO EVIDENCE OF EXTRAPROSTATIC EXTENSION, ANGIOLYMPHATIC INVASION OR SEMINAL VESICLE INVOLVEMENT IDENTIFIED. - RESECTION MARGINS, NEGATIVE FOR ATYPIA OR MALIGNANCY. 2. Lymph nodes, regional resection, right pelvic - SIX  . Hyperlipidemia associated with type 2 diabetes mellitus 09/24/2007   High triglycerides. Refused statin due to myalgias in past but then started half tablet of simvastatin 40mg  through [facility] of this note might be different from the original. Formatting of this note might be different from the original. High triglycerides. Refused statin due to myalgias in past but then started half tablet of simvastatin 40mg  through [place]  Last Assessment & Plan:  Formatting   . Hypertension associated with diabetes 09/24/2007   Amlodipine 10mg , losartan 100mg , HCTZ 25mg , propranolol 40 mg twice a day Home cuff 142/85 compared to 134/72 on my check. Likely home cuff about 10 points higher.   --> chlorthalidone 03/20/16 but patient didn't change and then BP controlled on next visit   Formatting of this note might be different from the original. Formatting of this note might be different from the original. Amlodipine 10mg , l  . Hypothyroidism   . IBS (irritable bowel syndrome)   . Insomnia 09/21/2009    on remeron through [place]- has had depression in past as well  . Lapband APL + HH repair 08/12/2013  . Macular degeneration    followed by ophthalmology  . Major depressive disorder   . 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  Follow up in 6 months unless wound issues arise.  Derm follow up in October 2022 scheduled.  I will see him back in 6 months.  . 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/2022  . OSA on CPAP 09/24/2007  . Parkinson's disease 12/20/2021  . PONV (postoperative nausea and vomiting)   . PTSD (post-traumatic stress disorder)    [facility]  . Sacroiliac joint  dysfunction 06/29/2018  . Senile purpura 05/02/2020  . 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 myelopathy or radiculopathy 02/01/2016  . Temporomandibular joint (TMJ) pain 04/14/2017  . Tremor 08/11/2015  . Type 2 diabetes mellitus 09/24/2007   Lantus 102 units, novolog 46 units 3x a day with meals . a1c under 8     Formatting of this note might be different from the original. Formatting of this note might be different from the original. And macular degeneration.   Last Assessment & Plan:  Formatting of this note might be different from the original. S: diabetic retinopathy followed up by optho yesterday and largely stable. He also has m  . Vitamin D deficiency 11/01/2020   At [facility]- on 2000 units a day at least since 2021     Past Surgical History:  Procedure Laterality Date  . CHOLECYSTECTOMY    . COLONOSCOPY  03/25/12, 09/28/08  . ESOPHAGOGASTRODUODENOSCOPY N/A 10/12/2014   Procedure: ESOPHAGOGASTRODUODENOSCOPY (EGD);  Surgeon: Iva Boop, MD;  Location: [facility];  Service: Endoscopy;  Laterality: N/A;  . ESOPHAGOGASTRODUODENOSCOPY ENDOSCOPY  09/28/08  . EXCISION MELANOMA WITH SENTINEL LYMPH NODE BIOPSY Right 05/31/2021   Procedure: WIDE LOCAL EXCISION RIGHT FOREARM WITH ADVANCEDMENT FLAP CLOSURE WITH SENTINEL LYMPH NODE MAPPING AND BIOSPY;  Surgeon: Almond Lint, MD;  Location: [facility];  Service: General;  Laterality: Right;  . FOOT SURGERY Bilateral   . HEMORRHOID SURGERY    . LAPAROSCOPIC GASTRIC BANDING  03/05/11   weight loss  . LAPAROSCOPIC GASTRIC BANDING WITH HIATAL HERNIA REPAIR  03/05/2011  . LYMPHADENECTOMY Bilateral 01/24/2014   Procedure: LYMPHADENECTOMY;  Surgeon: Crecencio Mc, MD;  Location: [facility];  Service: Urology;  Laterality: Bilateral;  . ORIF TIBIA FRACTURE Right   . PENILE PROSTHESIS IMPLANT    . PROSTATE SURGERY  01/2014  . ROBOT ASSISTED LAPAROSCOPIC RADICAL PROSTATECTOMY N/A 01/24/2014   Procedure: ROBOTIC ASSISTED LAPAROSCOPIC RADICAL  PROSTATECTOMY LEVEL 3;  Surgeon: Crecencio Mc, MD;  Location: [facility];  Service: Urology;  Laterality: N/A;  . SHOULDER SURGERY Right 2011  . TONSILLECTOMY     age 7  . VASECTOMY      Social History:   reports that he quit smoking about 42 years ago. His smoking use included cigarettes. He started smoking about 62 years ago. He has a 20 pack-year smoking history. He has never used smokeless tobacco. He reports that he does not currently use alcohol. He reports that he does not use drugs.  Allergies  Allergen Reactions  . Morphine Sulfate Itching and Rash    Family History  Problem Relation Age of Onset  . Hypertension Mother   . Stomach cancer Mother 41  . Alcohol abuse Brother   . Diabetes Child   . Hypertension Child   . Hypertension Paternal Uncle   . Heart attack Paternal Uncle   . Parkinson's disease Other        several cousins and uncles  . Colon cancer Neg Hx   . Colon polyps Neg Hx   . Rectal cancer Neg Hx      Prior to Admission medications   Medication Sig Start Date End  Date Taking? Authorizing Provider  amLODipine (NORVASC) 10 MG tablet Take 10 mg by mouth daily. 09/18/20  Yes [provider]  busPIRone (BUSPAR) 10 MG tablet Take by mouth 3 (three) times daily. 11/09/21  Yes [provider]  Cholecalciferol (VITAMIN D3) 50 MCG (2000 UT) TABS Take 1 tablet by mouth daily.   Yes [provider]  empagliflozin (JARDIANCE) 25 MG TABS tablet Take 1 tablet (25 mg total) by mouth daily before breakfast. ([facility]) Patient taking differently: Take 12.5 mg by mouth daily before breakfast. (GIVEN BY [facility]) 06/11/22  Yes Shelva Majestic, MD  fenofibrate (TRICOR) 145 MG tablet Take 160 mg by mouth daily.   Yes [provider]  gabapentin (NEURONTIN) 300 MG capsule Take 600 mg by mouth 3 (three) times daily. 02/21/22  Yes [provider]  hydrochlorothiazide (HYDRODIURIL) 25 MG tablet Take 25 mg by mouth daily.   Yes [provider]  insulin aspart (NOVOLOG) 100 UNIT/ML injection Inject 35 Units into the skin 3 (three) times daily with meals. When sugar is elevated   Yes [provider]  insulin glargine (LANTUS) 100 UNIT/ML injection Inject 50 Units into the skin at bedtime.   Yes [provider]  levothyroxine (SYNTHROID, LEVOTHROID) 50 MCG tablet Take 50 mcg by mouth daily before breakfast.   Yes [provider]  losartan (COZAAR) 100 MG tablet Take 100 mg by mouth daily.   Yes [provider]  mirtazapine (REMERON) 15 MG tablet Take 15 mg by mouth at bedtime.   Yes [provider]  neomycin-polymyxin b-dexamethasone (MAXITROL) 3.5-10000-0.1 SUSP Place 1 drop into both eyes 3 (three) times daily. 07/17/23  Yes [provider]  OVER THE COUNTER MEDICATION PreserVision for eyes   Yes [provider]  potassium chloride SA (K-DUR,KLOR-CON) 20 MEQ tablet Take 20 mEq by mouth daily.   Yes [provider]  simvastatin (ZOCOR) 80 MG tablet Take 40 mg by mouth daily at 6 PM. Take half tablet (40 mg) by mouth once daily 09/18/20  Yes [provider]  sucralfate (CARAFATE) 1 G tablet Take 1 g by mouth 4 (four) times daily -  with meals and at bedtime.   Yes [provider]  terbinafine (LAMISIL) 1 % cream Apply 1 Application topically 2 (two) times daily.   Yes [provider]  traMADol (ULTRAM) 50 MG tablet Take 50 mg by mouth every 6 (six) hours as needed.   Yes [provider]  zolpidem (AMBIEN) 5 MG tablet Take 5 mg by mouth at bedtime as needed for sleep.   Yes [provider]  glucose blood test strip Use to test 4 times daily. E11.9 10/26/19   Shelva Majestic, MD    Physical Exam: Vitals:   08/08/23 1951 08/08/23 2117 08/08/23 2126 08/08/23 2238  BP: (!) 135/91 (!) 152/72  132/68  Pulse: 89 78  73  Resp: (!) 23 20  19   Temp: (!) 100.5 F (38.1 C)  (!) 100.9 F (38.3 C) 98.8 F (37.1 C)   TempSrc: Oral  Rectal Oral  SpO2: 96% 95%  95%     Constitutional: NAD, calm  Eyes: PERTLA, lids and conjunctivae normal ENMT: Mucous membranes are moist. Posterior pharynx clear of any exudate or lesions.   Neck: supple, no masses  Respiratory: clear to auscultation bilaterally, no wheezing, no crackles. No accessory muscle use.  Cardiovascular: S1 & S2 heard, regular rate and rhythm. No extremity edema. No significant JVD. Abdomen:  No distension, no tenderness, soft. Bowel sounds active.  Musculoskeletal: no clubbing / cyanosis. No joint deformity upper and lower extremities.   Skin: no significant rashes, lesions, ulcers. Warm, dry, well-perfused. Neurologic: CN 2-12 grossly intact. Sensation intact, DTR normal. Strength 5/5 in all 4 limbs. Alert and oriented.  Psychiatric: Pleasant. Cooperative.    Labs and Imaging on Admission: I have personally reviewed following labs and imaging studies  CBC: Recent Labs  Lab 08/08/23 1830  WBC 46.5*  HGB 14.3  HCT 43.5  MCV 84.1  PLT 172   Basic Metabolic Panel: Recent Labs  Lab 08/08/23 1830  NA 136  K 4.0  CL 101  CO2 18*  GLUCOSE 173*  BUN 13  CREATININE 1.35*  CALCIUM 8.6*   GFR: CrCl cannot be calculated (Unknown ideal weight.). Liver Function Tests: No results for input(s): "AST", "ALT", "ALKPHOS", "BILITOT", "PROT", "ALBUMIN" in the last 168 hours. No results for input(s): "LIPASE", "AMYLASE" in the last 168 hours. No results for input(s): "AMMONIA" in the last 168 hours. Coagulation Profile: Recent Labs  Lab 08/08/23 2010  INR 1.2   Cardiac Enzymes: No results for input(s): "CKTOTAL", "CKMB", "CKMBINDEX", "TROPONINI" in the last 168 hours. BNP (last 3 results) No results for input(s): "PROBNP" in the last 8760 hours. HbA1C: No results for input(s): "HGBA1C" in the last 72 hours. CBG: No results for input(s): "GLUCAP" in the last 168 hours. Lipid Profile: No results for input(s): "CHOL", "HDL",  "LDLCALC", "TRIG", "CHOLHDL", "LDLDIRECT" in the last 72 hours. Thyroid Function Tests: No results for input(s): "TSH", "T4TOTAL", "FREET4", "T3FREE", "THYROIDAB" in the last 72 hours. Anemia Panel: No results for input(s): "VITAMINB12", "FOLATE", "FERRITIN", "TIBC", "IRON", "RETICCTPCT" in the last 72 hours. Urine analysis:    Component Value Date/Time   COLORURINE YELLOW 12/23/2020 0936   APPEARANCEUR CLEAR 12/23/2020 0936   LABSPEC 1.009 12/23/2020 0936   PHURINE 7.0 12/23/2020 0936   GLUCOSEU NEGATIVE 12/23/2020 0936   HGBUR NEGATIVE 12/23/2020 0936   BILIRUBINUR NEGATIVE 12/23/2020 0936   BILIRUBINUR Negative 05/02/2020 1341   KETONESUR NEGATIVE 12/23/2020 0936   PROTEINUR NEGATIVE 12/23/2020 0936   UROBILINOGEN 0.2 05/02/2020 1341   UROBILINOGEN 1.0 01/29/2008 1400   NITRITE NEGATIVE 12/23/2020 0936   LEUKOCYTESUR NEGATIVE 12/23/2020 0936   Sepsis Labs: @LABRCNTIP (procalcitonin:4,lacticidven:4) ) Recent Results (from the past 240 hour(s))  SARS Coronavirus 2 by RT PCR (hospital order, performed in [facility] hospital lab) *cepheid single result test* Anterior Nasal Swab     Status: None   Collection Time: 08/08/23  8:28 PM   Specimen: Anterior Nasal Swab  Result Value Ref Range Status   SARS Coronavirus 2 by RT PCR NEGATIVE NEGATIVE Final    Comment: Performed at [facility], [address] [place], [place] [address]     Radiological Exams on Admission: DG Chest Port 1 View  Result Date: 08/08/2023 CLINICAL DATA:  Atrial fibrillation EXAM: PORTABLE CHEST 1 VIEW COMPARISON:  12/23/2020 FINDINGS: Lungs volumes are small, but are symmetric and are clear. No pneumothorax or pleural effusion. Cardiac size within normal limits. Pulmonary vascularity is normal. Osseous structures are age-appropriate. No acute bone abnormality. IMPRESSION: 1. Pulmonary hypoinflation. Electronically Signed   By: Helyn Numbers M.D.   On: 08/08/2023 21:06    EKG: Independently reviewed.  Sinus rhythm with sinus arrhythmia, LBBB.   Assessment/Plan   1. SIRS  - Fever, tachypnea, and increased leukocytosis noted in ED   - No respiratory, urinary, or GI sxs and no meningismus or apparent  cellulitis  - Possibly adverse effect of vaccine he received yesterday  - Blood cultures were collected in the ED and empiric antibiotic started  - Continue empiric antibiotic coverage, check UA, follow cultures and clinical course    2. 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 LBBB  - Appears to be new  - No anginal symptoms  - Check echocardiogram    4. Insulin-dependent DM  - A1c was 6.9% in April 2024  - Continue long- and short-acting insulin    5. Hypertension  - Continue Norvasc, hold losartan and hydrochlorothiazide in light of increased creatinine   6. CLL  - Stage 0, under the care of Dr. Mosetta Putt for active surveillance   7. OSA  - CPAP while sleeping   8. Parkinson disease  - S/p MRgFUS thalamotomy at [facility]     DVT prophylaxis: Lovenox  Code Status: Full  Level of Care: Level of care: Telemetry Medical Family Communication: Wife at bedside  Disposition Plan:  Patient is from: home  Anticipated d/c is to: TBD Anticipated d/c date is: Possibly as early as 9/7 or 08/10/23  Patient currently: Pending cultures, echocardiogram, may need PT eval  Consults called: None  Admission status: Observation     Briscoe Deutscher, MD [facility]  08/08/2023, 11:52 PM

## 2023-08-08 NOTE — ED Triage Notes (Signed)
Pt was feeling weak and called EMS.  [facility] responded and advised pt he was in afib.  Pt came POV to the ED for eval.  Pt has no complaints of chest pain currently but states he is "just weak"  States " I always get weak after I get the covid shot"

## 2023-08-08 NOTE — H&P (Signed)
History and Physical    [name] NUU:[id number] DOB: [birth year] DOA: 08/08/2023  PCP: Shelva Majestic, MD   Patient coming from: Home   Chief Complaint: General weakness, fever   HPI: [name] is a 82 y.o. male with medical history significant for hypertension, insulin-dependent diabetes mellitus, CLL under active surveillance, Parkinson disease OSA, and history of prostate cancer who presents with generalized weakness and fever.  Patient states that he received a COVID-19 vaccine yesterday and has been experiencing generalized weakness and chills today.  He was too weak to get up from a seated position today which is very unusual for him.  He was unable to get up even with assistance from his wife.  He reports similar symptoms following his previous COVID vaccinations but his wife feels that his current condition is much more severe than those prior episodes.  Patient denies any cough, shortness of breath, sore throat, abdominal pain, nausea, vomiting, diarrhea, or dysuria.   ED Course: Upon arrival to the ED, patient is found to be febrile to 38.3 C and saturating mid 90s on room air with mild tachypnea, normal heart rate, and stable blood pressure.  EKG demonstrates sinus rhythm with LBBB.  There is hypoinflation noted on chest x-ray.  Labs are most notable for creatinine 1.35, WBC 46,500, negative COVID-19 PCR, and lactic acid 2.6.  Blood cultures were collected in the ED and the patient was given IV fluids and Rocephin.  Review of Systems:  All other systems reviewed and apart from HPI, are negative.  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 Thought to be agent orange related-CLL and prostate cancer  Diagnosis 1. Prostate, radical resection -  PROSTATIC ADENOCARCINOMA, GLEASON'S SCORE 3+4=7, INVOLVING BOTH LOBES. - NO EVIDENCE OF EXTRAPROSTATIC EXTENSION, ANGIOLYMPHATIC INVASION OR SEMINAL VESICLE INVOLVEMENT IDENTIFIED. - RESECTION MARGINS, NEGATIVE FOR ATYPIA OR MALIGNANCY. 2. Lymph nodes, regional resection, righ   Chronic pain syndrome 02/01/2016   Chronic right hip pain 09/27/2019   DDD (degenerative disc disease), lumbar    Diabetic retinopathy 09/24/2007   Diverticulosis of colon    Elevated PSA    Erectile dysfunction 12/30/2007   No rx.      GERD (gastroesophageal reflux disease)    Gynecomastia 12/28/2020   History of colonic polyps 08/22/2008   2004 3 adenomas 2009 none 2013 none 02/26/2017 8 mm sigmoid polyp was inflammatory - no recall planned given age/co-morbidities overall hx of polyps     History of prostate cancer 01/24/2014   Follows with Dr. Wilson Singer every 3 months. Dr. Clelia Croft every 6 months.  Diagnosis 1. 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 - NO EVIDENCE OF EXTRAPROSTATIC EXTENSION, ANGIOLYMPHATIC INVASION OR SEMINAL VESICLE INVOLVEMENT IDENTIFIED. - RESECTION MARGINS, NEGATIVE FOR ATYPIA OR MALIGNANCY. 2. Lymph nodes, regional resection, right pelvic - SIX   Hyperlipidemia associated with type 2 diabetes mellitus 09/24/2007   High triglycerides. Refused statin due to myalgias in past but then started half tablet of simvastatin 40mg  through [facility] of this note might be different from the original. Formatting of this note might be different from the original. High triglycerides. Refused statin due to myalgias in past but then started half tablet of simvastatin 40mg   through [place]  Last Assessment & Plan:  Formatting    Hypertension associated with diabetes 09/24/2007   Amlodipine 10mg , losartan 100mg , HCTZ 25mg , propranolol 40 mg twice a day Home cuff 142/85 compared to 134/72 on my check. Likely home cuff about 10 points higher.   --> chlorthalidone  03/20/16 but patient didn't change and then BP controlled on next visit   Formatting of this note might be different from the original. Formatting of this note might be different from the original. 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  Follow up in 6 months unless wound issues arise.  Derm follow up in October 2022 scheduled.  I will see him back in 6 months.   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 a1c under 8     Formatting of this note might be different from the original. Formatting of this note might be different from the original. And macular degeneration.   Last Assessment & Plan:  Formatting of this note might be different from the original. S: diabetic retinopathy followed up by optho yesterday and largely stable. He also has m   Vitamin D deficiency 11/01/2020   At [facility]- on 2000 units a day at least since 2021     Past Surgical History:  Procedure Laterality Date   CHOLECYSTECTOMY     COLONOSCOPY  03/25/12, 09/28/08   ESOPHAGOGASTRODUODENOSCOPY N/A 10/12/2014   Procedure: ESOPHAGOGASTRODUODENOSCOPY (EGD);  Surgeon: Iva Boop, MD;  Location: [facility];  Service: Endoscopy;   Laterality: N/A;   ESOPHAGOGASTRODUODENOSCOPY ENDOSCOPY  09/28/08   EXCISION MELANOMA WITH SENTINEL LYMPH NODE BIOPSY Right 05/31/2021   Procedure: WIDE LOCAL EXCISION RIGHT FOREARM WITH ADVANCEDMENT FLAP CLOSURE WITH SENTINEL LYMPH NODE MAPPING AND BIOSPY;  Surgeon: Almond Lint, MD;  Location: [facility];  Service: General;  Laterality: Right;   FOOT SURGERY Bilateral    HEMORRHOID SURGERY     LAPAROSCOPIC GASTRIC BANDING  03/05/11   weight loss   LAPAROSCOPIC GASTRIC BANDING WITH HIATAL HERNIA REPAIR  03/05/2011   LYMPHADENECTOMY Bilateral 01/24/2014   Procedure: LYMPHADENECTOMY;  Surgeon: Crecencio Mc, MD;  Location: [facility];  Service: Urology;  Laterality: Bilateral;   ORIF TIBIA FRACTURE Right    PENILE PROSTHESIS IMPLANT     PROSTATE SURGERY  01/2014   ROBOT ASSISTED LAPAROSCOPIC RADICAL PROSTATECTOMY N/A 01/24/2014   Procedure: ROBOTIC ASSISTED LAPAROSCOPIC RADICAL PROSTATECTOMY LEVEL 3;  Surgeon: Crecencio Mc, MD;  Location: [facility];  Service: Urology;  Laterality: N/A;   SHOULDER SURGERY Right 2011   TONSILLECTOMY     age 68   VASECTOMY      Social History:  reports that he quit smoking about 42 years ago. His smoking use included cigarettes. He started smoking about 62 years ago. He has a 20 pack-year smoking history. He has never used smokeless tobacco. He reports that he does not currently use alcohol. He reports that he does not use drugs.  Allergies  Allergen Reactions   Morphine Sulfate Itching and Rash    Family History  Problem Relation Age of Onset   Hypertension Mother    Stomach cancer Mother 22   Alcohol abuse Brother    Diabetes Child    Hypertension Child    Hypertension Paternal Uncle    Heart attack Paternal Uncle    Parkinson's disease Other        several cousins and uncles   Colon cancer Neg Hx    Colon polyps Neg Hx    Rectal cancer Neg Hx      Prior to Admission medications   Medication Sig Start Date End Date Taking? Authorizing  Provider  amLODipine (NORVASC) 10 MG tablet Take 10 mg by mouth daily. 09/18/20  Yes [provider]  busPIRone (BUSPAR) 10 MG tablet Take by mouth 3 (three) times daily. 11/09/21  Yes [provider]  Cholecalciferol (VITAMIN D3) 50 MCG (2000 UT) TABS Take 1 tablet by mouth daily.   Yes [provider]  empagliflozin (JARDIANCE) 25 MG TABS tablet Take 1 tablet (25 mg total) by mouth daily before breakfast. ([facility]) Patient taking differently: Take 12.5 mg by mouth daily before breakfast. (GIVEN BY [facility]) 06/11/22  Yes Shelva Majestic, MD  fenofibrate (TRICOR) 145 MG tablet Take 160 mg by mouth daily.   Yes [provider]  gabapentin (NEURONTIN) 300 MG capsule Take 600 mg by mouth 3 (three) times daily. 02/21/22  Yes [provider]  hydrochlorothiazide (HYDRODIURIL) 25 MG tablet Take 25 mg by mouth daily.   Yes [provider]  insulin aspart (NOVOLOG) 100 UNIT/ML injection Inject 35 Units into the skin 3 (three) times daily with meals. When sugar is elevated   Yes [provider]  insulin glargine (LANTUS) 100 UNIT/ML injection Inject 50 Units into the skin at bedtime.   Yes [provider]  levothyroxine (SYNTHROID, LEVOTHROID) 50 MCG tablet Take 50 mcg by mouth daily before breakfast.   Yes [provider]  losartan (COZAAR) 100 MG tablet Take 100 mg by mouth daily.   Yes [provider]  mirtazapine (REMERON) 15 MG tablet Take 15 mg by mouth at bedtime.   Yes [provider]  neomycin-polymyxin b-dexamethasone (MAXITROL) 3.5-10000-0.1 SUSP Place 1 drop into both eyes 3 (three) times daily. 07/17/23  Yes [provider]  OVER THE COUNTER MEDICATION PreserVision for eyes   Yes [provider]  potassium chloride SA (K-DUR,KLOR-CON) 20 MEQ tablet Take 20 mEq by mouth daily.   Yes [provider]  simvastatin (ZOCOR) 80 MG tablet Take 40 mg by mouth daily at 6 PM. Take half  tablet (40 mg) by mouth once daily 09/18/20  Yes [provider]  sucralfate (CARAFATE) 1 G tablet Take 1 g by mouth 4 (four) times daily -  with meals and at bedtime.   Yes [provider]  terbinafine (LAMISIL) 1 % cream Apply 1 Application topically 2 (two) times daily.   Yes [provider]  traMADol (ULTRAM) 50 MG tablet Take 50 mg by mouth every 6 (six) hours as needed.   Yes [provider]  zolpidem (AMBIEN) 5 MG tablet  Take 5 mg by mouth at bedtime as needed for sleep.   Yes [provider]  glucose blood test strip Use to test 4 times daily. E11.9 10/26/19   Shelva Majestic, MD    Physical Exam: Vitals:   08/08/23 1951 08/08/23 2117 08/08/23 2126 08/08/23 2238  BP: (!) 135/91 (!) 152/72  132/68  Pulse: 89 78  73  Resp: (!) 23 20  19   Temp: (!) 100.5 F (38.1 C)  (!) 100.9 F (38.3 C) 98.8 F (37.1 C)  TempSrc: Oral  Rectal Oral  SpO2: 96% 95%  95%    Constitutional: NAD, no pallor or diaphoresis   Eyes: PERTLA, lids and conjunctivae normal ENMT: Mucous membranes are moist. Posterior pharynx clear of any exudate or lesions.   Neck: supple, no masses  Respiratory: no wheezing, no crackles. No accessory muscle use.  Cardiovascular: S1 & S2 heard, regular rate and rhythm. No extremity edema.  Abdomen: No distension, no tenderness, soft. Bowel sounds active.  Musculoskeletal: no clubbing / cyanosis. No joint deformity upper and lower extremities.   Skin: no significant rashes, lesions, ulcers. Warm, dry, well-perfused. Neurologic: CN 2-12 grossly intact. Moving all extremities. Alert and oriented.  Psychiatric: Calm. Cooperative.    Labs and Imaging on Admission: I have personally reviewed following labs and imaging studies  CBC: Recent Labs  Lab 08/08/23 1830  WBC 46.5*  HGB 14.3  HCT 43.5  MCV 84.1  PLT 172   Basic Metabolic Panel: Recent Labs  Lab 08/08/23 1830  NA 136  K 4.0  CL 101  CO2 18*  GLUCOSE 173*   BUN 13  CREATININE 1.35*  CALCIUM 8.6*   GFR: CrCl cannot be calculated (Unknown ideal weight.). Liver Function Tests: No results for input(s): "AST", "ALT", "ALKPHOS", "BILITOT", "PROT", "ALBUMIN" in the last 168 hours. No results for input(s): "LIPASE", "AMYLASE" in the last 168 hours. No results for input(s): "AMMONIA" in the last 168 hours. Coagulation Profile: Recent Labs  Lab 08/08/23 2010  INR 1.2   Cardiac Enzymes: No results for input(s): "CKTOTAL", "CKMB", "CKMBINDEX", "TROPONINI" in the last 168 hours. BNP (last 3 results) No results for input(s): "PROBNP" in the last 8760 hours. HbA1C: No results for input(s): "HGBA1C" in the last 72 hours. CBG: No results for input(s): "GLUCAP" in the last 168 hours. Lipid Profile: No results for input(s): "CHOL", "HDL", "LDLCALC", "TRIG", "CHOLHDL", "LDLDIRECT" in the last 72 hours. Thyroid Function Tests: No results for input(s): "TSH", "T4TOTAL", "FREET4", "T3FREE", "THYROIDAB" in the last 72 hours. Anemia Panel: No results for input(s): "VITAMINB12", "FOLATE", "FERRITIN", "TIBC", "IRON", "RETICCTPCT" in the last 72 hours. Urine analysis:    Component Value Date/Time   COLORURINE YELLOW 12/23/2020 0936   APPEARANCEUR CLEAR 12/23/2020 0936   LABSPEC 1.009 12/23/2020 0936   PHURINE 7.0 12/23/2020 0936   GLUCOSEU NEGATIVE 12/23/2020 0936   HGBUR NEGATIVE 12/23/2020 0936   BILIRUBINUR NEGATIVE 12/23/2020 0936   BILIRUBINUR Negative 05/02/2020 1341   KETONESUR NEGATIVE 12/23/2020 0936   PROTEINUR NEGATIVE 12/23/2020 0936   UROBILINOGEN 0.2 05/02/2020 1341   UROBILINOGEN 1.0 01/29/2008 1400   NITRITE NEGATIVE 12/23/2020 0936   LEUKOCYTESUR NEGATIVE 12/23/2020 0936   Sepsis Labs: @LABRCNTIP (procalcitonin:4,lacticidven:4) ) Recent Results (from the past 240 hour(s))  SARS Coronavirus 2 by RT PCR (hospital order, performed in [facility] hospital lab) *cepheid single result test* Anterior Nasal Swab     Status: None    Collection Time: 08/08/23  8:28 PM   Specimen: Anterior Nasal Swab  Result  Value Ref Range Status   SARS Coronavirus 2 by RT PCR NEGATIVE NEGATIVE Final    Comment: Performed at [facility], [address] [place], [place] [address]     Radiological Exams on Admission: DG Chest Port 1 View  Result Date: 08/08/2023 CLINICAL DATA:  Atrial fibrillation EXAM: PORTABLE CHEST 1 VIEW COMPARISON:  12/23/2020 FINDINGS: Lungs volumes are small, but are symmetric and are clear. No pneumothorax or pleural effusion. Cardiac size within normal limits. Pulmonary vascularity is normal. Osseous structures are age-appropriate. No acute bone abnormality. IMPRESSION: 1. Pulmonary hypoinflation. Electronically Signed   By: Helyn Numbers M.D.   On: 08/08/2023 21:06    EKG: Independently reviewed. Sinus rhythm with sinus arrhythmia, LBBB.   Assessment/Plan   1. SIRS  - Fever, tachypnea, and increased leukocytosis noted in ED   - No respiratory, urinary, or GI sxs and no meningismus or apparent cellulitis  - Possibly adverse effect of vaccine he received yesterday  - Blood cultures were collected in the ED and empiric antibiotic started  - Continue empiric antibiotic coverage, check UA, follow cultures and clinical course    2. 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 LBBB  - Appears to be new  - No anginal symptoms  - Check echocardiogram    4. Insulin-dependent DM  - A1c was 6.9% in April 2024  - Continue long- and short-acting insulin    5. Hypertension  - Continue Norvasc, hold losartan and hydrochlorothiazide in light of increased creatinine   6. CLL  - Stage 0, under the care of Dr. Mosetta Putt for active surveillance   7. OSA  - CPAP while sleeping   8. Parkinson disease  - S/p [facility]FUS thalamotomy at [facility]     DVT prophylaxis: Lovenox  Code Status: Full  Level of Care: Level of care: Telemetry  Medical Family Communication: Wife at bedside  Disposition Plan:  Patient is from: home  Anticipated d/c is to: TBD Anticipated d/c date is: Possibly as early as 9/7 or 08/10/23  Patient currently: Pending cultures, echocardiogram, may need PT eval  Consults called: None  Admission status: Observation     Briscoe Deutscher, MD [facility]  08/08/2023, 11:52 PM

## 2023-08-08 NOTE — ED Provider Notes (Signed)
[facility] Provider Note   CSN: [id number] Arrival date & time: 08/08/23  4098     History  Chief Complaint  Patient presents with   Atrial Fibrillation    [name] is a 82 y.o. male.   Atrial Fibrillation   This patient is a 82 year old male, he has a history of Parkinson's disease and hypertension as well as a diabetic, he presents to the hospital today with a complaint of weakness.  According to the patient and his spouse he had slid out of his recliner chair and had tried to crawl across the floor, he was too weak to get up, this is a little bit unusual as usually he would be able to get up and get himself back into the chair albeit slowly.  He did receive his COVID-19 vaccination yesterday.  The patient denies dysuria diarrhea pain in his legs his chest his abdomen or his head, he does not recognize having a fever but feels very warm to the touch.  When the paramedics were called to the house they told the significant other that the patient was in atrial fibrillation but he refused transport so she brought him in her own vehicle.    Home Medications Prior to Admission medications   Medication Sig Start Date End Date Taking? Authorizing Provider  amLODipine (NORVASC) 10 MG tablet Take 10 mg by mouth daily. 09/18/20   [provider]  amoxicillin-clavulanate (AUGMENTIN) 875-125 MG tablet Take 1 tablet by mouth 2 (two) times daily. 07/07/23   Jarold Motto, PA  azelastine (ASTELIN) 0.1 % nasal spray Place 1 spray into both nostrils 2 (two) times daily. 03/25/23   [provider]  busPIRone (BUSPAR) 10 MG tablet Take by mouth 3 (three) times daily. 11/09/21   [provider]  carboxymethylcellulose 1 % ophthalmic solution Apply 1 drop to eye 3 (three) times daily.    [provider]  clobetasol cream (TEMOVATE) 0.05 % Apply 1 Application topically 2 (two) times daily.    [provider]   empagliflozin (JARDIANCE) 25 MG TABS tablet Take 1 tablet (25 mg total) by mouth daily before breakfast. (GIVEN BY [facility]) 06/11/22   Shelva Majestic, MD  fenofibrate (TRICOR) 145 MG tablet Take 160 mg by mouth daily.    [provider]  fluticasone (FLONASE) 50 MCG/ACT nasal spray Place into both nostrils. 12/24/20   [provider]  gabapentin (NEURONTIN) 300 MG capsule Take 600 mg by mouth at bedtime. 02/21/22   [provider]  glucose 4 GM chewable tablet Chew 1 tablet by mouth once. 15g. 12/24/20   [provider]  glucose blood test strip Use to test 4 times daily. E11.9 10/26/19   Shelva Majestic, MD  hydrochlorothiazide (HYDRODIURIL) 25 MG tablet Take 25 mg by mouth daily.    [provider]  hydrocortisone 2.5 % cream SMARTSIG:1 Topical Daily 12/13/21   [provider]  insulin aspart (NOVOLOG) 100 UNIT/ML injection Inject 35 Units into the skin 3 (three) times daily with meals. When sugar is elevated    [provider]  insulin glargine (LANTUS) 100 UNIT/ML injection Inject 50 Units into the skin at bedtime.    [provider]  ketoconazole (NIZORAL) 2 % shampoo Apply 1 Application topically 2 (two) times a week.    [provider]  levothyroxine (SYNTHROID, LEVOTHROID) 50 MCG tablet Take 50 mcg by mouth daily before breakfast.    [provider]  losartan (COZAAR) 100 MG tablet Take 100 mg by mouth daily.    [provider]  mirtazapine (REMERON) 15 MG tablet Take 15 mg by mouth at bedtime.    [provider]  OVER THE COUNTER MEDICATION PreserVision for eyes    [provider]  pantoprazole (PROTONIX) 40 MG tablet Take 40 mg by mouth daily.    [provider]  potassium chloride SA (K-DUR,KLOR-CON) 20 MEQ tablet Take 20 mEq by mouth daily.    [provider]  simvastatin (ZOCOR) 80 MG tablet Take 40 mg by mouth daily at 6 PM. Take half tablet (40 mg) by  mouth once daily 09/18/20   [provider]  sucralfate (CARAFATE) 1 G tablet Take 1 g by mouth 4 (four) times daily -  with meals and at bedtime.    [provider]  terbinafine (LAMISIL) 1 % cream Apply 1 Application topically 2 (two) times daily.    [provider]  traMADol (ULTRAM) 50 MG tablet Take 50 mg by mouth every 6 (six) hours as needed.    [provider]  trimethoprim-polymyxin b (POLYTRIM) ophthalmic solution Apply to eye. 11/05/21   [provider]  zolpidem (AMBIEN) 5 MG tablet Take 5 mg by mouth at bedtime as needed for sleep.    [provider]      Allergies    Morphine sulfate    Review of Systems   Review of Systems  All other systems reviewed and are negative.   Physical Exam Updated Vital Signs BP 132/68   Pulse 73   Temp 98.8 F (37.1 C) (Oral)   Resp 19   SpO2 95%  Physical Exam Vitals and nursing note reviewed.  Constitutional:      General: He is not in acute distress.    Appearance: He is well-developed.  HENT:     Head: Normocephalic and atraumatic.     Mouth/Throat:     Pharynx: No oropharyngeal exudate.  Eyes:     General: No scleral icterus.       Right eye: No discharge.        Left eye: No discharge.     Conjunctiva/sclera: Conjunctivae normal.     Pupils: Pupils are equal, round, and reactive to light.  Neck:     Thyroid: No thyromegaly.     Vascular: No JVD.  Cardiovascular:     Rate and Rhythm: Normal rate and regular rhythm.     Heart sounds: Normal heart sounds. No murmur heard.    No friction rub. No gallop.  Pulmonary:     Effort: Pulmonary effort is normal. No respiratory distress.     Breath sounds: Normal breath sounds. No wheezing or rales.  Abdominal:     General: Bowel sounds are normal. There is no distension.     Palpations: Abdomen is soft. There is no mass.     Tenderness: There is no abdominal tenderness.  Musculoskeletal:        General: No tenderness.  Normal range of motion.     Cervical back: Normal range of motion and neck supple.     Right lower leg: No edema.     Left lower leg: No edema.  Lymphadenopathy:     Cervical: No cervical adenopathy.  Skin:    General: Skin is warm and dry.     Findings: No erythema or rash.  Neurological:     Mental Status: He is alert.     Coordination: Coordination normal.  Comments: Patient was very slowly, he has parkinsonian-like traits, there is no focal weakness or numbness, he has very slow to move with very rigid movements.  Very little facial expressions.  He is able to answer questions appropriately  Psychiatric:        Behavior: Behavior normal.     ED Results / Procedures / Treatments   Labs (all labs ordered are listed, but only abnormal results are displayed) Labs Reviewed  BASIC METABOLIC PANEL - Abnormal; Notable for the following components:      Result Value   CO2 18 (*)    Glucose, Bld 173 (*)    Creatinine, Ser 1.35 (*)    Calcium 8.6 (*)    GFR, Estimated 53 (*)    Anion gap 17 (*)    All other components within normal limits  CBC - Abnormal; Notable for the following components:   WBC 46.5 (*)    All other components within normal limits  PROTIME-INR - Abnormal; Notable for the following components:   Prothrombin Time 15.5 (*)    All other components within normal limits  LACTIC ACID, PLASMA - Abnormal; Notable for the following components:   Lactic Acid, Venous 2.6 (*)    All other components within normal limits  SARS CORONAVIRUS 2 BY RT PCR  CULTURE, BLOOD (ROUTINE X 2)  CULTURE, BLOOD (ROUTINE X 2)  URINALYSIS, W/ REFLEX TO CULTURE (INFECTION SUSPECTED)  TROPONIN I (HIGH SENSITIVITY)  TROPONIN I (HIGH SENSITIVITY)    EKG EKG Interpretation Date/Time:  Friday August 08 2023 19:59:32 EDT Ventricular Rate:  86 PR Interval:  198 QRS Duration:  142 QT Interval:  425 QTC Calculation: 509 R Axis:   -30  Text Interpretation: Atrial flutter Left  bundle branch block Confirmed by Eber Hong ([id number]) on 08/08/2023 10:26:59 PM  Radiology DG Chest Port 1 View  Result Date: 08/08/2023 CLINICAL DATA:  Atrial fibrillation EXAM: PORTABLE CHEST 1 VIEW COMPARISON:  12/23/2020 FINDINGS: Lungs volumes are small, but are symmetric and are clear. No pneumothorax or pleural effusion. Cardiac size within normal limits. Pulmonary vascularity is normal. Osseous structures are age-appropriate. No acute bone abnormality. IMPRESSION: 1. Pulmonary hypoinflation. Electronically Signed   By: Helyn Numbers M.D.   On: 08/08/2023 21:06    Procedures Procedures    Medications Ordered in ED Medications  sodium chloride 0.9 % bolus 500 mL (has no administration in time range)  cefTRIAXone (ROCEPHIN) 1 g in sodium chloride 0.9 % 100 mL IVPB (has no administration in time range)  acetaminophen (TYLENOL) tablet 650 mg (650 mg Oral Given 08/08/23 2153)    ED Course/ Medical Decision Making/ A&P                                 Medical Decision Making Amount and/or Complexity of Data Reviewed Labs: ordered. Radiology: ordered.  Risk OTC drugs. Decision regarding hospitalization.   The patient is warm to the touch, his heart rate is around 100, temperature is 99.9 in his mouth, I suspect that he has an underlying infection that is driving the weakness.  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  Fever was present as well, he  technically meets criteria for sepsis but no source has been found.  Still waiting on a urine, somewhat oliguric.  IV fluids given, Rocephin given, cultures obtained, does not meet criteria for sepsis at this time as his fever has defervesced with Tylenol.  I discussed the case with Dr. Gayla Medicus who has been kind enough to admit the patient to hospital        Final Clinical Impression(s) / ED Diagnoses Final diagnoses:  Febrile illness  Generalized weakness    Rx / DC Orders ED Discharge Orders     None         Eber Hong, MD 08/08/23 2246

## 2023-08-08 NOTE — Progress Notes (Signed)
Pharmacy Antibiotic Note  [name] is a 82 y.o. male admitted on 08/08/2023 with general weakness and fever with concern for sepsis.  Pharmacy has been consulted for cefepime and vancomycin dosing.  Plan: Vancomycin 2000mg  x 1 load followed by vancomycin 2000mg  q24h (eAUC 468, Scr 1.21) Cefepime 2g q8h F/u renal function, micro data and length of therapy Vancomycin levels as needed     Temp (24hrs), Avg:100 F (37.8 C), Min:98.8 F (37.1 C), Max:100.9 F (38.3 C)  Recent Labs  Lab 08/08/23 1830 08/08/23 2010  WBC 46.5*  --   CREATININE 1.35*  --   LATICACIDVEN  --  2.6*    CrCl cannot be calculated (Unknown ideal weight.).    Allergies  Allergen Reactions   Morphine Sulfate Itching and Rash    Antimicrobials this admission: Vancomycin 9/6 > Cefepime 9/6 > Ceftriaxone 9/6 x 1  Microbiology results: 9/6 BCx: pending 9/6 covid screen: pending  Thank you for allowing pharmacy to be a part of this patient's care.  Marja Kays 08/08/2023 11:56 PM

## 2023-08-09 ENCOUNTER — Observation Stay (HOSPITAL_COMMUNITY): Payer: Medicare Other

## 2023-08-09 DIAGNOSIS — G510 Bell's palsy: Secondary | ICD-10-CM | POA: Diagnosis present

## 2023-08-09 DIAGNOSIS — I499 Cardiac arrhythmia, unspecified: Secondary | ICD-10-CM

## 2023-08-09 DIAGNOSIS — G894 Chronic pain syndrome: Secondary | ICD-10-CM | POA: Diagnosis present

## 2023-08-09 DIAGNOSIS — E1122 Type 2 diabetes mellitus with diabetic chronic kidney disease: Secondary | ICD-10-CM | POA: Diagnosis present

## 2023-08-09 DIAGNOSIS — K219 Gastro-esophageal reflux disease without esophagitis: Secondary | ICD-10-CM | POA: Diagnosis present

## 2023-08-09 DIAGNOSIS — G20A1 Parkinson's disease without dyskinesia, without mention of fluctuations: Secondary | ICD-10-CM | POA: Diagnosis present

## 2023-08-09 DIAGNOSIS — Z794 Long term (current) use of insulin: Secondary | ICD-10-CM | POA: Diagnosis not present

## 2023-08-09 DIAGNOSIS — E039 Hypothyroidism, unspecified: Secondary | ICD-10-CM | POA: Diagnosis present

## 2023-08-09 DIAGNOSIS — I447 Left bundle-branch block, unspecified: Secondary | ICD-10-CM

## 2023-08-09 DIAGNOSIS — R6510 Systemic inflammatory response syndrome (SIRS) of non-infectious origin without acute organ dysfunction: Secondary | ICD-10-CM | POA: Diagnosis present

## 2023-08-09 DIAGNOSIS — R531 Weakness: Secondary | ICD-10-CM | POA: Diagnosis present

## 2023-08-09 DIAGNOSIS — I152 Hypertension secondary to endocrine disorders: Secondary | ICD-10-CM | POA: Diagnosis present

## 2023-08-09 DIAGNOSIS — Z1152 Encounter for screening for COVID-19: Secondary | ICD-10-CM | POA: Diagnosis not present

## 2023-08-09 DIAGNOSIS — F4310 Post-traumatic stress disorder, unspecified: Secondary | ICD-10-CM | POA: Diagnosis present

## 2023-08-09 DIAGNOSIS — N182 Chronic kidney disease, stage 2 (mild): Secondary | ICD-10-CM | POA: Diagnosis present

## 2023-08-09 DIAGNOSIS — R5083 Postvaccination fever: Secondary | ICD-10-CM | POA: Diagnosis present

## 2023-08-09 DIAGNOSIS — E1169 Type 2 diabetes mellitus with other specified complication: Secondary | ICD-10-CM | POA: Diagnosis present

## 2023-08-09 DIAGNOSIS — D72829 Elevated white blood cell count, unspecified: Secondary | ICD-10-CM | POA: Diagnosis present

## 2023-08-09 DIAGNOSIS — I4891 Unspecified atrial fibrillation: Secondary | ICD-10-CM | POA: Diagnosis present

## 2023-08-09 DIAGNOSIS — F329 Major depressive disorder, single episode, unspecified: Secondary | ICD-10-CM | POA: Diagnosis present

## 2023-08-09 DIAGNOSIS — N179 Acute kidney failure, unspecified: Secondary | ICD-10-CM | POA: Diagnosis present

## 2023-08-09 DIAGNOSIS — E781 Pure hyperglyceridemia: Secondary | ICD-10-CM | POA: Diagnosis present

## 2023-08-09 DIAGNOSIS — I4892 Unspecified atrial flutter: Secondary | ICD-10-CM | POA: Diagnosis present

## 2023-08-09 DIAGNOSIS — E11319 Type 2 diabetes mellitus with unspecified diabetic retinopathy without macular edema: Secondary | ICD-10-CM | POA: Diagnosis present

## 2023-08-09 DIAGNOSIS — C9110 Chronic lymphocytic leukemia of B-cell type not having achieved remission: Secondary | ICD-10-CM | POA: Diagnosis present

## 2023-08-09 DIAGNOSIS — F0670 Mild neurocognitive disorder due to known physiological condition without behavioral disturbance: Secondary | ICD-10-CM | POA: Diagnosis present

## 2023-08-09 LAB — CBC
HCT: 40 % (ref 39.0–52.0)
Hemoglobin: 12.9 g/dL — ABNORMAL LOW (ref 13.0–17.0)
MCH: 27.7 pg (ref 26.0–34.0)
MCHC: 32.3 g/dL (ref 30.0–36.0)
MCV: 85.8 fL (ref 80.0–100.0)
Platelets: 141 10*3/uL — ABNORMAL LOW (ref 150–400)
RBC: 4.66 MIL/uL (ref 4.22–5.81)
RDW: 15.2 % (ref 11.5–15.5)
WBC: 41 10*3/uL — ABNORMAL HIGH (ref 4.0–10.5)
nRBC: 0 % (ref 0.0–0.2)

## 2023-08-09 LAB — ECHOCARDIOGRAM COMPLETE
AR max vel: 2.13 cm2
AV Area VTI: 2.1 cm2
AV Area mean vel: 2.05 cm2
AV Mean grad: 10 mmHg
AV Peak grad: 17.8 mmHg
Ao pk vel: 2.11 m/s
Height: 72 in
S' Lateral: 3.7 cm
Weight: 4222.25 [oz_av]

## 2023-08-09 LAB — URINALYSIS, W/ REFLEX TO CULTURE (INFECTION SUSPECTED)
Bacteria, UA: NONE SEEN
Bilirubin Urine: NEGATIVE
Glucose, UA: >=500 mg/dL — AB
Hgb urine dipstick: NEGATIVE
Ketones, ur: 20 mg/dL — AB
Leukocytes,Ua: NEGATIVE
Nitrite: NEGATIVE
Protein, ur: 30 mg/dL — AB
Specific Gravity, Urine: 1.03 (ref 1.005–1.030)
pH: 5 (ref 5.0–8.0)

## 2023-08-09 LAB — GLUCOSE, CAPILLARY
Glucose-Capillary: 135 mg/dL — ABNORMAL HIGH (ref 70–99)
Glucose-Capillary: 158 mg/dL — ABNORMAL HIGH (ref 70–99)
Glucose-Capillary: 133 mg/dL — ABNORMAL HIGH (ref 70–99)
Glucose-Capillary: 96 mg/dL (ref 70–99)
Glucose-Capillary: 136 mg/dL — ABNORMAL HIGH (ref 70–99)

## 2023-08-09 LAB — BASIC METABOLIC PANEL
Anion gap: 13 (ref 5–15)
BUN: 14 mg/dL (ref 8–23)
CO2: 21 mmol/L — ABNORMAL LOW (ref 22–32)
Calcium: 7.9 mg/dL — ABNORMAL LOW (ref 8.9–10.3)
Chloride: 102 mmol/L (ref 98–111)
Creatinine, Ser: 1.21 mg/dL (ref 0.61–1.24)
GFR, Estimated: >60 mL/min (ref >60)
Glucose, Bld: 130 mg/dL — ABNORMAL HIGH (ref 70–99)
Potassium: 3.3 mmol/L — ABNORMAL LOW (ref 3.5–5.1)
Sodium: 136 mmol/L (ref 135–145)

## 2023-08-09 LAB — MAGNESIUM: Magnesium: 1.7 mg/dL (ref 1.7–2.4)

## 2023-08-09 LAB — LACTIC ACID, PLASMA: Lactic Acid, Venous: 1.3 mmol/L (ref 0.5–1.9)

## 2023-08-09 MED ORDER — POTASSIUM CHLORIDE CRYS ER 20 MEQ PO TBCR
40.0000 meq | EXTENDED_RELEASE_TABLET | Freq: Every day | ORAL | Status: DC
  Administered 2023-08-09 – 2023-08-10 (×2): 40 meq via ORAL
  Filled 2023-08-09 (×2): qty 2

## 2023-08-09 MED ORDER — SODIUM CHLORIDE 0.9 % IV SOLN
2.0000 g | Freq: Three times a day (TID) | INTRAVENOUS | Status: DC
  Administered 2023-08-09 – 2023-08-10 (×3): 2 g via INTRAVENOUS
  Filled 2023-08-09 (×3): qty 12.5

## 2023-08-09 MED ORDER — ONDANSETRON 4 MG PO TBDP
4.0000 mg | ORAL_TABLET | Freq: Three times a day (TID) | ORAL | Status: DC | PRN
  Administered 2023-08-09: 4 mg via ORAL
  Filled 2023-08-09: qty 1

## 2023-08-09 MED ORDER — VANCOMYCIN HCL 2000 MG/400ML IV SOLN: 2000.0000 mg | INTRAVENOUS | Status: DC

## 2023-08-09 MED ORDER — LACTATED RINGERS IV SOLN: INTRAVENOUS | Status: AC

## 2023-08-09 NOTE — Progress Notes (Signed)
PROGRESS NOTE    [name]  DUK:[id number] DOB: 05-11-1941 DOA: 08/08/2023 PCP: Shelva Majestic, MD    Brief Narrative:  82 year old gentleman with history of hypertension, type 2 diabetes, CLL under surveillance, Parkinson's, sleep apnea and prostate cancer presented with generalized weakness, low-grade fever after receiving COVID-19 vaccine 1 day prior to admit.  Lives at home with wife.  He was extremely weak, wife called EMS and they found him with A-fib and tachycardia however he declined to be transported to ER with EMS.  Does not like to come to the hospital.  He continued to look weaker, his wife brought him to ER in private vehicle.  In the emergency room, low-grade fever, on room air.  Mildly tachypneic.  Blood pressure stable.  EKG showed a flutter with left bundle branch block.  Chest x-ray normal.  COVID-19 and influenza swab negative.  White count 46,000 with baseline about 38,000.  Blood cultures were collected, he was started on antibiotics.   Assessment & Plan:   SIRS, no clear source of bacterial infection.  Patient did present with low-grade fever, tachypnea and increased leukocytosis from baseline as well as AKI.  This could be from hemoconcentration. Possible viral syndrome.  Received COVID-19 vaccine 1 day prior. Blood cultures are collected, pending.  Urine analysis is normal. Improving with IV fluid resuscitation.  Patient was started on broad-spectrum antibiotics including cefepime, Flagyl and vancomycin.  With clinical stability, will discontinue Flagyl and vancomycin.  Continue cefepime today.  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  Continue IV fluids.  Already improving.  New onset a flutter and left bundle branch block: Patient without any ischemic symptoms.  Without any chest pain or shortness of breath.  Does not have any history suggestive of A-fib or flutter in the past. Currently remains fairly symptomatic.  Rate  controlled. Echocardiogram with near normal ejection fraction.  No intracardiac thrombus. May benefit with anticoagulation and rate control, will consult cardiology.  Type 2 diabetes, insulin-dependent.  Well-controlled.  On insulin.  Continue. Essential hypertension, stable on Norvasc.  Losartan hydrochlorothiazide on hold.  Will resume after clinical improvement. Sleep apnea, CPAP at night CLL, under surveillance. Parkinson's disease, followed by neurology as outpatient.   DVT prophylaxis: enoxaparin (LOVENOX) injection 40 mg Start: 08/09/23 1000   Code Status: Full code Family Communication: Wife at the bedside Disposition Plan: Status is: Observation The patient will require care spanning > 2 midnights and should be moved to inpatient because: IV antibiotics, cardiac investigations     Consultants:  Cardiology  Procedures:  None  Antimicrobials:  Cefepime, Flagyl and vancomycin 9/6---   Subjective: Patient seen and examined multiple times today.  He is focused on going home.  Denies any complaints.  Wife reported he is still being weak.  Denies any chest pain or palpitations.  Telemetry monitor shows mostly left bundle branch block.  Objective: Vitals:   08/09/23 0028 08/09/23 0112 08/09/23 0409 08/09/23 0836  BP:  130/67 (!) 133/55 136/79  Pulse:  72 67 81  Resp:  18  19  Temp: 99.2 F (37.3 C) 98 F (36.7 C) 98.8 F (37.1 C) 99.2 F (37.3 C)  TempSrc: Rectal  Oral Oral  SpO2:  95% 96% 95%  Weight:  119.7 kg    Height:  6' (1.829 m)      Intake/Output Summary (Last 24 hours) at 08/09/2023 1153 Last data filed at 08/09/2023 1145 Gross per 24 hour  Intake 942.96 ml  Output  450 ml  Net 492.96 ml   Filed Weights   08/09/23 0112  Weight: 119.7 kg    Examination:  General exam: Appears calm and comfortable  He has some resting tremors. Respiratory system: Clear to auscultation. Respiratory effort normal. Cardiovascular system: S1 & S2 heard, RRR. No JVD,  murmurs, rubs, gallops or clicks. No pedal edema. Gastrointestinal system: Abdomen is nondistended, soft and nontender. No organomegaly or masses felt. Normal bowel sounds heard. Central nervous system: Alert and oriented. No focal neurological deficits. Extremities: Symmetric 5 x 5 power.     Data Reviewed: I have personally reviewed following labs and imaging studies  CBC: Recent Labs  Lab 08/08/23 1830 08/09/23 0012  WBC 46.5* 41.0*  HGB 14.3 12.9*  HCT 43.5 40.0  MCV 84.1 85.8  PLT 172 141*   Basic Metabolic Panel: Recent Labs  Lab 08/08/23 1830 08/09/23 0012  NA 136 136  K 4.0 3.3*  CL 101 102  CO2 18* 21*  GLUCOSE 173* 130*  BUN 13 14  CREATININE 1.35* 1.21  CALCIUM 8.6* 7.9*  MG  --  1.7   GFR: Estimated Creatinine Clearance: 66.1 mL/min (by C-G formula based on SCr of 1.21 mg/dL). Liver Function Tests: No results for input(s): "AST", "ALT", "ALKPHOS", "BILITOT", "PROT", "ALBUMIN" in the last 168 hours. No results for input(s): "LIPASE", "AMYLASE" in the last 168 hours. No results for input(s): "AMMONIA" in the last 168 hours. Coagulation Profile: Recent Labs  Lab 08/08/23 2010  INR 1.2   Cardiac Enzymes: No results for input(s): "CKTOTAL", "CKMB", "CKMBINDEX", "TROPONINI" in the last 168 hours. BNP (last 3 results) No results for input(s): "PROBNP" in the last 8760 hours. HbA1C: No results for input(s): "HGBA1C" in the last 72 hours. CBG: Recent Labs  Lab 08/09/23 0136 08/09/23 0837 08/09/23 1144  GLUCAP 135* 158* 133*   Lipid Profile: No results for input(s): "CHOL", "HDL", "LDLCALC", "TRIG", "CHOLHDL", "LDLDIRECT" in the last 72 hours. Thyroid Function Tests: No results for input(s): "TSH", "T4TOTAL", "FREET4", "T3FREE", "THYROIDAB" in the last 72 hours. Anemia Panel: No results for input(s): "VITAMINB12", "FOLATE", "FERRITIN", "TIBC", "IRON", "RETICCTPCT" in the last 72 hours. Sepsis Labs: Recent Labs  Lab 08/08/23 2010 08/09/23 0012   LATICACIDVEN 2.6* 1.3    Recent Results (from the past 240 hour(s))  SARS Coronavirus 2 by RT PCR (hospital order, performed in [facility] hospital lab) *cepheid single result test* Anterior Nasal Swab     Status: None   Collection Time: 08/08/23  8:28 PM   Specimen: Anterior Nasal Swab  Result Value Ref Range Status   SARS Coronavirus 2 by RT PCR NEGATIVE NEGATIVE Final    Comment: Performed at [facility], [address], [place], [place] [address]  Blood culture (routine x 2)     Status: None (Preliminary result)   Collection Time: 08/08/23  9:36 PM   Specimen: BLOOD  Result Value Ref Range Status   Specimen Description BLOOD LEFT ANTECUBITAL  Final   Special Requests   Final    BOTTLES DRAWN AEROBIC AND ANAEROBIC Blood Culture results may not be optimal due to an excessive volume of blood received in culture bottles   Culture   Final    NO GROWTH < 12 HOURS Performed at [facility], [address] [place], [place] [address]    Report Status PENDING  Incomplete  Blood culture (routine x 2)     Status: None (Preliminary result)   Collection Time: 08/08/23 10:14 PM  Specimen: BLOOD  Result Value Ref Range Status   Specimen Description BLOOD SITE NOT SPECIFIED  Final   Special Requests   Final    BOTTLES DRAWN AEROBIC AND ANAEROBIC Blood Culture results may not be optimal due to an inadequate volume of blood received in culture bottles   Culture   Final    NO GROWTH < 12 HOURS Performed at [facility], [address] [place], [place] [address]    Report Status PENDING  Incomplete         Radiology Studies: ECHOCARDIOGRAM COMPLETE  Result Date: 08/09/2023    ECHOCARDIOGRAM REPORT   Patient Name:   CIPRIANO HESCOCK Date of Exam: 08/09/2023 Medical Rec #:  [id number]       Height:       72.0 in Accession #:    [id number]      Weight:       263.9 lb Date of Birth:  September 17, 1941       BSA:          2.397 m Patient Age:    81 years        BP:           136/79  mmHg Patient Gender: M               HR:           84 bpm. Exam Location:  Inpatient Procedure: 2D Echo, Cardiac Doppler and Color Doppler Indications:    Heart block. Left bundle branch block.  History:        Patient has prior history of Echocardiogram examinations, most                 recent 10/16/2015. Chronic kidney disease. Parkinson's.,                 Arrythmias:LBBB and Atrial Fibrillation; Risk                 Factors:Hypertension, Dyslipidemia, Diabetes and Sleep Apnea.  Sonographer:    Delcie Roch RDCS Referring Phys: [id number] TIMOTHY S OPYD IMPRESSIONS  1. Abnormal septal motion . Left ventricular ejection fraction, by estimation, is 55 to 60%. The left ventricle has normal function. The left ventricle has no regional wall motion abnormalities. Left ventricular diastolic parameters are indeterminate.  2. Right ventricular systolic function is normal. The right ventricular size is normal. There is normal pulmonary artery systolic pressure.  3. Left atrial size was moderately dilated.  4. The mitral valve is abnormal. Trivial mitral valve regurgitation. No evidence of mitral stenosis.  5. The aortic valve is tricuspid. There is mild calcification of the aortic valve. Aortic valve regurgitation is not visualized. Aortic valve sclerosis is present, with no evidence of aortic valve stenosis.  6. The inferior vena cava is normal in size with greater than 50% respiratory variability, suggesting right atrial pressure of 3 mmHg. FINDINGS  Left Ventricle: Abnormal septal motion. Left ventricular ejection fraction, by estimation, is 55 to 60%. The left ventricle has normal function. The left ventricle has no regional wall motion abnormalities. The left ventricular internal cavity size was normal in size. There is no left ventricular hypertrophy. Left ventricular diastolic parameters are indeterminate. Right Ventricle: The right ventricular size is normal. No increase in right ventricular wall thickness.  Right ventricular systolic function is normal. There is normal pulmonary artery systolic pressure. The tricuspid regurgitant velocity is 2.49 m/s, and  with an assumed right atrial pressure of 3 mmHg, the  estimated right ventricular systolic pressure is 27.8 mmHg. Left Atrium: Left atrial size was moderately dilated. Right Atrium: Right atrial size was normal in size. Pericardium: There is no evidence of pericardial effusion. Mitral Valve: The mitral valve is abnormal. There is mild thickening of the mitral valve leaflet(s). Trivial mitral valve regurgitation. No evidence of mitral valve stenosis. Tricuspid Valve: The tricuspid valve is normal in structure. Tricuspid valve regurgitation is mild . No evidence of tricuspid stenosis. Aortic Valve: The aortic valve is tricuspid. There is mild calcification of the aortic valve. Aortic valve regurgitation is not visualized. Aortic valve sclerosis is present, with no evidence of aortic valve stenosis. Aortic valve mean gradient measures 10.0 mmHg. Aortic valve peak gradient measures 17.8 mmHg. Aortic valve area, by VTI measures 2.10 cm. Pulmonic Valve: The pulmonic valve was normal in structure. Pulmonic valve regurgitation is trivial. No evidence of pulmonic stenosis. Aorta: The aortic root is normal in size and structure. Venous: The inferior vena cava is normal in size with greater than 50% respiratory variability, suggesting right atrial pressure of 3 mmHg. IAS/Shunts: No atrial level shunt detected by color flow Doppler.  LEFT VENTRICLE PLAX 2D LVIDd:         5.40 cm LVIDs:         3.70 cm LV PW:         1.10 cm LV IVS:        1.00 cm LVOT diam:     2.10 cm LV SV:         87 LV SV Index:   36 LVOT Area:     3.46 cm  RIGHT VENTRICLE             IVC RV Basal diam:  2.70 cm     IVC diam: 1.70 cm RV S prime:     13.90 cm/s TAPSE (M-mode): 2.0 cm LEFT ATRIUM             Index        RIGHT ATRIUM           Index LA diam:        4.40 cm 1.84 cm/m   RA Area:     16.80 cm  LA Vol (A2C):   71.7 ml 29.91 ml/m  RA Volume:   41.90 ml  17.48 ml/m LA Vol (A4C):   63.9 ml 26.66 ml/m LA Biplane Vol: 71.2 ml 29.70 ml/m  AORTIC VALVE AV Area (Vmax):    2.13 cm AV Area (Vmean):   2.05 cm AV Area (VTI):     2.10 cm AV Vmax:           211.00 cm/s AV Vmean:          144.500 cm/s AV VTI:            0.412 m AV Peak Grad:      17.8 mmHg AV Mean Grad:      10.0 mmHg LVOT Vmax:         129.60 cm/s LVOT Vmean:        85.720 cm/s LVOT VTI:          0.250 m LVOT/AV VTI ratio: 0.61  AORTA Ao Root diam: 3.40 cm Ao Asc diam:  3.50 cm TRICUSPID VALVE TR Peak grad:   24.8 mmHg TR Vmax:        249.00 cm/s  SHUNTS Systemic VTI:  0.25 m Systemic Diam: 2.10 cm Charlton Haws MD Electronically signed by Charlton Haws MD Signature Date/Time: 08/09/2023/10:46:31  AM    Final    DG Chest Port 1 View  Result Date: 08/08/2023 CLINICAL DATA:  Atrial fibrillation EXAM: PORTABLE CHEST 1 VIEW COMPARISON:  12/23/2020 FINDINGS: Lungs volumes are small, but are symmetric and are clear. No pneumothorax or pleural effusion. Cardiac size within normal limits. Pulmonary vascularity is normal. Osseous structures are age-appropriate. No acute bone abnormality. IMPRESSION: 1. Pulmonary hypoinflation. Electronically Signed   By: Helyn Numbers M.D.   On: 08/08/2023 21:06        Scheduled Meds:  amLODipine  10 mg Oral Daily   atorvastatin  40 mg Oral Daily   busPIRone  10 mg Oral TID   enoxaparin (LOVENOX) injection  40 mg Subcutaneous Daily   gabapentin  600 mg Oral TID   insulin aspart  0-5 Units Subcutaneous QHS   insulin aspart  0-9 Units Subcutaneous TID WC   insulin aspart  10 Units Subcutaneous TID WC   insulin glargine-yfgn  35 Units Subcutaneous QHS   levothyroxine  50 mcg Oral QAC breakfast   mirtazapine  15 mg Oral QHS   neomycin-polymyxin b-dexamethasone  1 drop Both Eyes TID   potassium chloride  40 mEq Oral Daily   sodium chloride flush  3 mL Intravenous Q12H   sucralfate  1 g Oral TID WC & HS    terbinafine  1 Application Topical BID   Continuous Infusions:  ceFEPime (MAXIPIME) IV 2 g (08/09/23 1010)   lactated ringers       LOS: 0 days    Time spent: 40 minutes    Dorcas Carrow, MD [facility]

## 2023-08-09 NOTE — Evaluation (Signed)
Occupational Therapy Evaluation Patient Details Name: [name] MRN: [id number] DOB: [birth year] Today's Date: 08/09/2023   History of Present Illness [name] is a 82 y.o. male who presented after a fall with general weakness and fever with concern for sepsis. Workup pending. PMHx: hypertension, type 2 diabetes, CLL under surveillance, Parkinson's, sleep apnea and prostate cancer   Clinical Impression   [name] was evaluated s/p the above admission list. He is mod I at baseline and lives with his wife who assists intermittently as needed. Upon evaluation the pt was limited by generalized weakness, unsteady gait and poor activity tolerance. Overall he needed min A for bed mobility and for steadying balance during transfers and mobility, pt would benefit from AD to reduce fall risk. Due to the deficits listed below the pt also needs up to mod A for LB ADLs and set up A fro UB ADLs. Pt will benefit from continued acute OT services and HHOT        If plan is discharge home, recommend the following: A little help with walking and/or transfers;A little help with bathing/dressing/bathroom;Assistance with cooking/housework;Direct supervision/assist for medications management;Direct supervision/assist for financial management;Help with stairs or ramp for entrance;Assist for transportation    Functional Status Assessment  Patient has had a recent decline in their functional status and demonstrates the ability to make significant improvements in function in a reasonable and predictable amount of time.  Equipment Recommendations  None recommended by OT       Precautions / Restrictions Precautions Precautions: Fall Restrictions Weight Bearing Restrictions: No      Mobility Bed Mobility Overal bed mobility: Needs Assistance Bed Mobility: Supine to Sit, Sit to Supine     Supine to sit: Min assist Sit to supine: Min assist        Transfers Overall transfer level: Needs  assistance Equipment used: None Transfers: Sit to/from Stand Sit to Stand: Min assist           General transfer comment: min A to steady once standing      Balance Overall balance assessment: Needs assistance Sitting-balance support: Feet supported Sitting balance-Leahy Scale: Good     Standing balance support: No upper extremity supported, During functional activity Standing balance-Leahy Scale: Fair Standing balance comment: statically                           ADL either performed or assessed with clinical judgement   ADL Overall ADL's : Needs assistance/impaired Eating/Feeding: Set up   Grooming: Applying deodorant   Upper Body Bathing: Set up   Lower Body Bathing: Moderate assistance;Sit to/from stand   Upper Body Dressing : Set up;Sitting   Lower Body Dressing: Moderate assistance;Sit to/from stand   Toilet Transfer: Minimal assistance;Ambulation Toilet Transfer Details (indicate cue type and reason): for balance without AD Toileting- Clothing Manipulation and Hygiene: Contact guard assist;Sitting/lateral lean       Functional mobility during ADLs: Minimal assistance General ADL Comments: would benefit from AD     Vision Baseline Vision/History: 0 No visual deficits Vision Assessment?: No apparent visual deficits     Perception Perception: Within Functional Limits       Praxis Praxis: WFL       Pertinent Vitals/Pain Pain Assessment Pain Assessment: No/denies pain     Extremity/Trunk Assessment Upper Extremity Assessment Upper Extremity Assessment: Generalized weakness   Lower Extremity Assessment Lower Extremity Assessment: Defer to PT evaluation   Cervical / Trunk Assessment Cervical /  Trunk Assessment: Kyphotic   Communication Communication Communication: No apparent difficulties   Cognition Arousal: Alert Behavior During Therapy: Flat affect, WFL for tasks assessed/performed Overall Cognitive Status: Within Functional  Limits for tasks assessed                                 General Comments: overall WFL, limited insight to safety and deficits     General Comments  VSS on RA, wife present     Home Living Family/patient expects to be discharged to:: Private residence Living Arrangements: Spouse/significant other Available Help at Discharge: Family;Available 24 hours/day Type of Home: House Home Access: Level entry     Home Layout: One level     Bathroom Shower/Tub: Tub/shower unit;Walk-in shower   Bathroom Toilet: Handicapped height     Home Equipment: Agricultural consultant (2 wheels);Rollator (4 wheels);Cane - single point;BSC/3in1;Shower seat;Grab bars - toilet;Grab bars - tub/shower;Electric scooter          Prior Functioning/Environment Prior Level of Function : Independent/Modified Independent             Mobility Comments: no AD in the home, RW, SPC vs scoorter for community mobility. No falls other than the "slide out of his chair" day of admit ADLs Comments: mod I, wife completes IADLs        OT Problem List: Decreased strength;Decreased range of motion;Decreased activity tolerance;Impaired balance (sitting and/or standing);Decreased safety awareness;Decreased knowledge of use of DME or AE;Decreased knowledge of precautions      OT Treatment/Interventions: Self-care/ADL training;Therapeutic exercise;Energy conservation;DME and/or AE instruction;Therapeutic activities;Balance training    OT Goals(Current goals can be found in the care plan section) Acute Rehab OT Goals Patient Stated Goal: home OT Goal Formulation: With patient Time For Goal Achievement: 08/23/23 Potential to Achieve Goals: Good ADL Goals Pt Will Perform Grooming: with supervision;standing Pt Will Perform Lower Body Dressing: with supervision;sit to/from stand Pt Will Transfer to Toilet: with supervision;ambulating Additional ADL Goal #1: pt will tolerate at least 8 minute of OOB activity to  demonstrate increased endurance for ADLs at home  OT Frequency: Min 1X/week       AM-PAC OT "6 Clicks" Daily Activity     Outcome Measure Help from another person eating meals?: A Little Help from another person taking care of personal grooming?: A Little Help from another person toileting, which includes using toliet, bedpan, or urinal?: A Little Help from another person bathing (including washing, rinsing, drying)?: A Lot Help from another person to put on and taking off regular upper body clothing?: A Little Help from another person to put on and taking off regular lower body clothing?: A Lot 6 Click Score: 16   End of Session Equipment Utilized During Treatment: Gait belt Nurse Communication: Mobility status  Activity Tolerance: Patient tolerated treatment well Patient left: in bed;with call bell/phone within reach;with bed alarm set;with family/visitor present  OT Visit Diagnosis: Unsteadiness on feet (R26.81);Other abnormalities of gait and mobility (R26.89);Muscle weakness (generalized) (M62.81);History of falling (Z91.81)                Time: 7846-9629 OT Time Calculation (min): 19 min Charges:  OT General Charges $OT Visit: 1 Visit OT Evaluation $OT Eval Moderate Complexity: 1 Mod  Derenda Mis, OTR/L Acute Rehabilitation Services Office [phone] Secure Chat Communication Preferred   Donia Pounds 08/09/2023, 3:26 PM

## 2023-08-09 NOTE — Progress Notes (Signed)
In patient's room to set up Cpap as ordered. While setting equipment up, patient states he can't wear a full face mask. I asked patient if he could tolerate a nasal mask instead and he stated no, he can only tolerate nasal pillows. He stated he would be fine without wearing Cpap and that he doesn't wear it every night at home. I informed patient that he can have his nurse call us if he decides he needs it and wants to try the nasal mask for the night. NAD noted. Spo2 96% on room air.

## 2023-08-09 NOTE — ED Notes (Signed)
ED TO INPATIENT HANDOFF REPORT  ED Nurse Name and Phone #: Stephenson Cichy/ [phone]  S Name/Age/Gender [name] 82 y.o. male Room/Bed: 018C/018C  Code Status   Code Status: Full Code  Home/SNF/Other Home Patient oriented to: self, place, time, and situation Is this baseline? Yes   Triage Complete: Triage complete  Chief Complaint SIRS (systemic inflammatory response syndrome) (HCC) [R65.10]  Triage Note Pt was feeling weak and called EMS.  [facility] responded and advised pt he was in afib.  Pt came POV to the ED for eval.  Pt has no complaints of chest pain currently but states he is "just weak"  States " I always get weak after I get the covid shot"    Allergies Allergies  Allergen Reactions   Morphine Sulfate Itching and Rash    Level of Care/Admitting Diagnosis ED Disposition     ED Disposition  Admit   Condition  --   Comment  Hospital Area: [facility] [[id number]]  Level of Care: Telemetry Medical [104]  May place patient in observation at [facility] or [facility] if equivalent level of care is available:: Yes  Covid Evaluation: Confirmed COVID Negative  Diagnosis: SIRS (systemic inflammatory response syndrome) [facility]) [[id number]]  Admitting Physician: Briscoe Deutscher [[id number]]  Attending Physician: Briscoe Deutscher [[id number]]          B Medical/Surgery History Past Medical History:  Diagnosis Date   Arthritis    Basal cell carcinoma 03/18/2017   left neck CX3 5FU   Bell's palsy 05/08/2020   Cataract    Chronic bilateral low back pain without sciatica 12/12/2015   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 Thought to be agent orange related-CLL and prostate cancer  Diagnosis 1. 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 - NO EVIDENCE OF EXTRAPROSTATIC EXTENSION, ANGIOLYMPHATIC INVASION OR SEMINAL VESICLE INVOLVEMENT IDENTIFIED. - RESECTION MARGINS, NEGATIVE  FOR ATYPIA OR MALIGNANCY. 2. Lymph nodes, regional resection, righ   Chronic pain syndrome 02/01/2016   Chronic right hip pain 09/27/2019   DDD (degenerative disc disease), lumbar    Diabetic retinopathy 09/24/2007   Diverticulosis of colon    Elevated PSA    Erectile dysfunction 12/30/2007   No rx.      GERD (gastroesophageal reflux disease)    Gynecomastia 12/28/2020   History of colonic polyps 08/22/2008   2004 3 adenomas 2009 none 2013 none 02/26/2017 8 mm sigmoid polyp was inflammatory - no recall planned given age/co-morbidities overall hx of polyps     History of prostate cancer 01/24/2014   Follows with Dr. Wilson Singer every 3 months. Dr. Clelia Croft every 6 months.  Diagnosis 1. 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 - NO EVIDENCE OF EXTRAPROSTATIC EXTENSION, ANGIOLYMPHATIC INVASION OR SEMINAL VESICLE INVOLVEMENT IDENTIFIED. - RESECTION MARGINS, NEGATIVE FOR ATYPIA OR MALIGNANCY. 2. Lymph nodes, regional resection, right pelvic - SIX   Hyperlipidemia associated with type 2 diabetes mellitus 09/24/2007   High triglycerides. Refused statin due to myalgias in past but then started half tablet of simvastatin 40mg  through [facility] of this note might be different from the original. Formatting of this note might be different from the original. High triglycerides. Refused statin due to myalgias in past but then started half tablet of simvastatin 40mg  through [place]  Last Assessment & Plan:  Formatting    Hypertension associated with diabetes 09/24/2007   Amlodipine 10mg , losartan 100mg , HCTZ 25mg , propranolol  40 mg twice a day Home cuff 142/85 compared to 134/72 on my check. Likely home cuff about 10 points higher.   --> chlorthalidone 03/20/16 but patient didn't change and then BP controlled on next visit   Formatting of this note might be different from the original. Formatting of this note might be different from the original. 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  Follow up in 6 months unless wound issues arise.  Derm follow up in October 2022 scheduled.  I will see him back in 6 months.   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 a1c under 8     Formatting of this note might be different from the original. Formatting of this note might be different from the original. And macular degeneration.   Last Assessment & Plan:  Formatting of this note might be different from the original. S: diabetic retinopathy followed up by optho yesterday and largely stable. He also has m   Vitamin D deficiency 11/01/2020   At [facility]- on 2000 units a day at least since 2021    Past Surgical History:  Procedure Laterality Date   CHOLECYSTECTOMY     COLONOSCOPY  03/25/12, 09/28/08   ESOPHAGOGASTRODUODENOSCOPY N/A 10/12/2014   Procedure: ESOPHAGOGASTRODUODENOSCOPY (EGD);  Surgeon: Iva Boop, MD;  Location: [facility];  Service: Endoscopy;  Laterality: N/A;   ESOPHAGOGASTRODUODENOSCOPY ENDOSCOPY  09/28/08   EXCISION MELANOMA WITH SENTINEL LYMPH NODE BIOPSY Right 05/31/2021   Procedure: WIDE LOCAL EXCISION RIGHT FOREARM WITH ADVANCEDMENT FLAP CLOSURE  WITH SENTINEL LYMPH NODE MAPPING AND BIOSPY;  Surgeon: Almond Lint, MD;  Location: [facility];  Service: General;  Laterality: Right;   FOOT SURGERY Bilateral    HEMORRHOID SURGERY     LAPAROSCOPIC GASTRIC BANDING  03/05/11   weight loss   LAPAROSCOPIC GASTRIC BANDING WITH HIATAL HERNIA REPAIR  03/05/2011   LYMPHADENECTOMY Bilateral 01/24/2014   Procedure: LYMPHADENECTOMY;  Surgeon: Crecencio Mc, MD;  Location: [facility];  Service: Urology;  Laterality: Bilateral;   ORIF TIBIA FRACTURE Right    PENILE PROSTHESIS IMPLANT     PROSTATE SURGERY  01/2014   ROBOT ASSISTED LAPAROSCOPIC RADICAL PROSTATECTOMY N/A 01/24/2014   Procedure: ROBOTIC ASSISTED LAPAROSCOPIC RADICAL PROSTATECTOMY LEVEL 3;  Surgeon: Crecencio Mc, MD;  Location: [facility];  Service: Urology;  Laterality: N/A;   SHOULDER SURGERY Right 2011   TONSILLECTOMY     age 86   VASECTOMY       A IV Location/Drains/Wounds Patient Lines/Drains/Airways Status     Active Line/Drains/Airways     Name Placement date Placement time Site Days   Peripheral IV 08/08/23 20  G 1.88" Anterior;Right Forearm 08/08/23  2300  Forearm  1   Closed System Drain --  --  --  --   Urethral Catheter Georgena Spurling, RN Coude 18 Fr. 01/24/14  1405  Coude  3484   Incision (Closed) 05/31/21 Arm Right 05/31/21  1219  -- 800   Incision (Closed) 05/31/21 Axilla Right 05/31/21  1246  -- 800   Incision - 6 Ports Abdomen 1: Lateral;Left;Lower 2: Left;Lateral;Upper 3: Umbilicus;Superior 4: Right;Lateral;Lower 5: Right;Medial;Upper 6: Right;Upper;Lateral 01/24/14  1122  -- 3484            Intake/Output Last 24 hours  Intake/Output Summary (Last 24 hours) at 08/09/2023 0027 Last data filed at 08/09/2023 0024 Gross per 24 hour  Intake 602.51 ml  Output --  Net 602.51 ml    Labs/Imaging Results for orders placed or performed during the hospital encounter of 08/08/23 (from the past 48 hour(s))  Basic metabolic panel     Status: Abnormal   Collection Time:  08/08/23  6:30 PM  Result Value Ref Range   Sodium 136 135 - 145 mmol/L   Potassium 4.0 3.5 - 5.1 mmol/L   Chloride 101 98 - 111 mmol/L   CO2 18 (L) 22 - 32 mmol/L   Glucose, Bld 173 (H) 70 - 99 mg/dL    Comment: Glucose reference range applies only to samples taken after fasting for at least 8 hours.   BUN 13 8 - 23 mg/dL   Creatinine, Ser 1.61 (H) 0.61 - 1.24 mg/dL   Calcium 8.6 (L) 8.9 - 10.3 mg/dL   GFR, Estimated 53 (L) >60 mL/min    Comment: (NOTE) Calculated using the CKD-EPI Creatinine Equation (2021)    Anion gap 17 (H) 5 - 15    Comment: Performed at [facility], [address] [place], [place] [address]  CBC     Status: Abnormal   Collection Time: 08/08/23  6:30 PM  Result Value Ref Range   WBC 46.5 (H) 4.0 - 10.5 K/uL   RBC 5.17 4.22 - 5.81 MIL/uL   Hemoglobin 14.3 13.0 - 17.0 g/dL   HCT 54.0 98.1 - 19.1 %   MCV 84.1 80.0 - 100.0 fL   MCH 27.7 26.0 - 34.0 pg   MCHC 32.9 30.0 - 36.0 g/dL   RDW 47.8 29.5 - 62.1 %   Platelets 172 150 - 400 K/uL   nRBC 0.0 0.0 - 0.2 %    Comment: Performed at [facility], [address] [place], [place] [address]  Troponin I (High Sensitivity)     Status: None   Collection Time: 08/08/23  6:30 PM  Result Value Ref Range   Troponin I (High Sensitivity) 13 <18 ng/L    Comment: (NOTE) Elevated high sensitivity troponin I (hsTnI) values and significant  changes across serial measurements may suggest ACS but many other  chronic and acute conditions are known to elevate hsTnI results.  Refer to the "Links" section for chest pain algorithms and additional  guidance. Performed at [facility], [address], [place], [place] [address]   Protime-INR     Status: Abnormal   Collection Time: 08/08/23  8:10 PM  Result Value Ref Range   Prothrombin Time 15.5 (H) 11.4 - 15.2 seconds   INR 1.2 0.8 - 1.2    Comment: (NOTE) INR goal varies based on device and disease states. Performed at [facility], [address] [place], [place] [address]  Lactic acid, plasma     Status: Abnormal   Collection Time: 08/08/23  8:10 PM  Result Value Ref Range   Lactic Acid, Venous 2.6 (HH) 0.5 - 1.9 mmol/L    Comment: CRITICAL RESULT CALLED TO, READ BACK BY AND VERIFIED WITH Rolla Etienne RN 2130 08/08/23 AMIREHSANI F Performed at [facility], [address], [place], [place] [address]   Troponin I (High Sensitivity)     Status: None   Collection Time: 08/08/23  8:10 PM  Result Value Ref Range   Troponin I (High Sensitivity) 15 <18 ng/L    Comment: (NOTE) Elevated high sensitivity troponin I (hsTnI) values and significant  changes across serial measurements may suggest ACS but many other  chronic and acute conditions are known to elevate hsTnI results.  Refer to the "Links" section for chest pain algorithms and additional  guidance. Performed at [facility], [address] [place], [place] [address]   SARS Coronavirus 2 by RT PCR (hospital order, performed in [facility] hospital lab) *cepheid single result test* Anterior Nasal Swab     Status: None   Collection Time: 08/08/23  8:28 PM   Specimen: Anterior Nasal Swab  Result Value Ref Range   SARS Coronavirus 2 by RT PCR NEGATIVE NEGATIVE    Comment: Performed at [facility], [address] [place], [place] [address]   DG Chest Port 1 View  Result Date: 08/08/2023 CLINICAL DATA:  Atrial fibrillation EXAM: PORTABLE CHEST 1 VIEW COMPARISON:  12/23/2020 FINDINGS: Lungs volumes are small, but are symmetric and are clear. No pneumothorax or pleural effusion. Cardiac size within normal limits. Pulmonary vascularity is normal. Osseous structures are age-appropriate. No acute bone abnormality. IMPRESSION: 1. Pulmonary hypoinflation. Electronically Signed   By: Helyn Numbers M.D.   On: 08/08/2023 21:06    Pending Labs Unresulted Labs (From admission, onward)     Start     Ordered   08/15/23 0500  Creatinine, serum  (enoxaparin (LOVENOX)     CrCl >/= 30 ml/min)  Weekly,   R     Comments: while on enoxaparin therapy    08/08/23 2352   08/09/23 0500  Basic metabolic panel  Daily,   R      08/08/23 2352   08/09/23 0500  CBC  Daily,   R      08/08/23 2352   08/09/23 0500  Magnesium  Tomorrow morning,   R        08/08/23 2352   08/08/23 2353  Lactic acid, plasma  (Lactic Acid)  Once,   R        08/08/23 2352   08/08/23 2136  Blood culture (routine x 2)  BLOOD CULTURE X 2,   R (with STAT occurrences)      08/08/23 2135   08/08/23 1947  Urinalysis, w/ Reflex to Culture (Infection Suspected) -Urine, Clean Catch  Once,   URGENT       Question:  Specimen Source  Answer:  Urine, Clean Catch   08/08/23 1946            Vitals/Pain Today's Vitals   08/08/23 1951 08/08/23 2117 08/08/23 2126 08/08/23 2238  BP: (!) 135/91 (!) 152/72  132/68  Pulse: 89 78  73  Resp: (!) 23 20  19   Temp: (!) 100.5 F (38.1 C)  (!) 100.9 F (38.3 C) 98.8 F (37.1 C)  TempSrc: Oral  Rectal Oral  SpO2: 96% 95%  95%  PainSc:  Isolation Precautions No active isolations  Medications Medications  traMADol (ULTRAM) tablet 50 mg (has no administration in time range)  amLODipine (NORVASC) tablet 10 mg (has no administration in time range)  atorvastatin (LIPITOR) tablet 40 mg (has no administration in time range)  busPIRone (BUSPAR) tablet 10 mg (has no administration in time range)  mirtazapine (REMERON) tablet 15 mg (has no administration in time range)  zolpidem (AMBIEN) tablet 5 mg (has no administration in time range)  levothyroxine (SYNTHROID) tablet 50 mcg (has no administration in time range)  sucralfate (CARAFATE) tablet 1 g (has no administration in time range)  gabapentin (NEURONTIN) capsule 600 mg (has no administration in time range)  neomycin-polymyxin b-dexamethasone (MAXITROL) ophthalmic suspension 1 drop (has no administration in time range)  terbinafine (LAMISIL) 1 % cream 1 Application (has no administration in time  range)  insulin glargine-yfgn (SEMGLEE) injection 35 Units (has no administration in time range)  insulin aspart (novoLOG) injection 0-9 Units (has no administration in time range)  insulin aspart (novoLOG) injection 0-5 Units (has no administration in time range)  insulin aspart (novoLOG) injection 10 Units (has no administration in time range)  enoxaparin (LOVENOX) injection 40 mg (has no administration in time range)  sodium chloride flush (NS) 0.9 % injection 3 mL (has no administration in time range)  lactated ringers infusion (has no administration in time range)  acetaminophen (TYLENOL) tablet 650 mg (has no administration in time range)    Or  acetaminophen (TYLENOL) suppository 650 mg (has no administration in time range)  senna-docusate (Senokot-S) tablet 1 tablet (has no administration in time range)  ceFEPIme (MAXIPIME) 2 g in sodium chloride 0.9 % 100 mL IVPB (has no administration in time range)  metroNIDAZOLE (FLAGYL) IVPB 500 mg (has no administration in time range)  vancomycin (VANCOREADY) IVPB 2000 mg/400 mL (has no administration in time range)  acetaminophen (TYLENOL) tablet 650 mg (650 mg Oral Given 08/08/23 2153)  sodium chloride 0.9 % bolus 500 mL (0 mLs Intravenous Stopped 08/09/23 0024)  cefTRIAXone (ROCEPHIN) 1 g in sodium chloride 0.9 % 100 mL IVPB (0 g Intravenous Stopped 08/09/23 0024)    Mobility non-ambulatory     Focused Assessments     R Recommendations: See Admitting Provider Note  Report given to:   Additional Notes: Pt came in for his HR but he's admitted for an infection with a fever. His temp was 100.4 initially and went up 100.9. He got tylenol and it went down to 99.2 rectally. He also got rocephin and a NaCl bolus. Lactic is 2.6 and WBC is 46.5. Wife went home. He has a 20 G in the R FA via ultrasound.

## 2023-08-09 NOTE — Progress Notes (Signed)
  Echocardiogram 2D Echocardiogram has been performed.  [name] 08/09/2023, 9:48 AM

## 2023-08-09 NOTE — Consult Note (Signed)
Cardiology Consultation   Patient ID: [name] MRN: [id number]; DOB: Sep 11, 1941  Admit date: 08/08/2023 Date of Consult: 08/09/2023  PCP:  David Majestic, MD   [facility] Providers Cardiologist:  None        Patient Profile:   [name] is a 82 y.o. male with a hx of T2DM, hypertension, CLL, Parkinson's, OSA, prostate cancer who is being seen 08/09/2023 for the evaluation of ?atrial fibrillation at the request of Dr [name].  History of Present Illness:   [name] is a 82 year old above medical history who presented to ED yesterday with weakness.  He had just had COVID-19 vaccine.  Denies any chest pain or dyspnea.  In the ED, initial vital signs notable for BP 136/63, pulse 95, SpO2 93% on room air, temperature 100.5.  Labs notable for creatinine 1.35, sodium 136, potassium 4.0, hemoglobin 14.3, WBC 46.5, Lactate 2.6 > 1.3.  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 Thought to be agent orange related-CLL and prostate cancer  Diagnosis 1. 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 - NO EVIDENCE OF EXTRAPROSTATIC EXTENSION, ANGIOLYMPHATIC INVASION OR SEMINAL VESICLE INVOLVEMENT IDENTIFIED. - RESECTION MARGINS, NEGATIVE FOR ATYPIA OR MALIGNANCY. 2. Lymph nodes, regional resection, righ   Chronic pain syndrome 02/01/2016   Chronic right hip pain 09/27/2019   DDD (degenerative disc disease), lumbar    Diabetic retinopathy 09/24/2007   Diverticulosis of colon    Elevated PSA    Erectile dysfunction 12/30/2007   No rx.       GERD (gastroesophageal reflux disease)    Gynecomastia 12/28/2020   History of colonic polyps 08/22/2008   2004 3 adenomas 2009 none 2013 none 02/26/2017 8 mm sigmoid polyp was inflammatory - no recall planned given age/co-morbidities overall hx of polyps     History of prostate cancer 01/24/2014   Follows with Dr. Wilson Hoover every 3 months. Dr. Clelia Hoover every 6 months.  Diagnosis 1. 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 - NO EVIDENCE OF EXTRAPROSTATIC EXTENSION, ANGIOLYMPHATIC INVASION OR SEMINAL VESICLE INVOLVEMENT IDENTIFIED. - RESECTION MARGINS, NEGATIVE FOR ATYPIA OR MALIGNANCY. 2. Lymph nodes, regional resection, right pelvic - SIX   Hyperlipidemia associated with type 2 diabetes mellitus 09/24/2007   High triglycerides. Refused statin due to myalgias in past but then started half tablet of simvastatin 40mg  through [facility] of this note might be different from the original. Formatting of this note might be different from the original. High triglycerides. Refused statin due to myalgias in past but then started half tablet of simvastatin 40mg  through [name]  Last Assessment & Plan:  Formatting    Hypertension associated with diabetes 09/24/2007   Amlodipine 10mg , losartan 100mg , HCTZ 25mg , propranolol 40 mg twice a day Home cuff 142/85 compared to 134/72 on my check. Likely home cuff about 10 points higher.   --> chlorthalidone 03/20/16 but patient didn't change and then BP controlled on next visit   Formatting of this note might be different from the original. Formatting of this note  might be different from the original. 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  Follow up in 6 months unless wound issues arise.  Derm follow up in October 2022 scheduled.  I will see him back in 6 months.   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 a1c under 8     Formatting of this note might be different from the original. Formatting of this note might be different from the original. And macular degeneration.   Last Assessment & Plan:  Formatting of this note might be different from the original. S: diabetic retinopathy followed up by optho yesterday and largely stable. He also has m   Vitamin D deficiency 11/01/2020   At [facility]- on 2000 units a day at least since 2021     Past Surgical History:  Procedure Laterality Date   CHOLECYSTECTOMY     COLONOSCOPY  03/25/12, 09/28/08   ESOPHAGOGASTRODUODENOSCOPY N/A 10/12/2014   Procedure: ESOPHAGOGASTRODUODENOSCOPY (EGD);  Surgeon: Iva Boop, MD;  Location: [facility];  Service: Endoscopy;  Laterality: N/A;   ESOPHAGOGASTRODUODENOSCOPY ENDOSCOPY  09/28/08   EXCISION MELANOMA WITH SENTINEL LYMPH NODE BIOPSY Right 05/31/2021   Procedure: WIDE LOCAL EXCISION RIGHT FOREARM WITH ADVANCEDMENT FLAP CLOSURE WITH SENTINEL LYMPH NODE MAPPING AND BIOSPY;  Surgeon: Almond Lint, MD;  Location: [facility];  Service: General;  Laterality: Right;   FOOT SURGERY Bilateral    HEMORRHOID SURGERY     LAPAROSCOPIC GASTRIC BANDING  03/05/11   weight loss   LAPAROSCOPIC GASTRIC BANDING WITH HIATAL HERNIA  REPAIR  03/05/2011   LYMPHADENECTOMY Bilateral 01/24/2014   Procedure: LYMPHADENECTOMY;  Surgeon: Crecencio Mc, MD;  Location: [facility];  Service: Urology;  Laterality: Bilateral;   ORIF TIBIA FRACTURE Right    PENILE PROSTHESIS IMPLANT     PROSTATE SURGERY  01/2014   ROBOT ASSISTED LAPAROSCOPIC RADICAL PROSTATECTOMY N/A 01/24/2014   Procedure: ROBOTIC ASSISTED LAPAROSCOPIC RADICAL PROSTATECTOMY LEVEL 3;  Surgeon: Crecencio Mc, MD;  Location: [facility];  Service: Urology;  Laterality: N/A;   SHOULDER SURGERY Right 2011   TONSILLECTOMY     age 40   VASECTOMY         Inpatient Medications: Scheduled Meds:  amLODipine  10 mg Oral Daily   atorvastatin  40 mg Oral Daily   busPIRone  10 mg Oral TID   enoxaparin (LOVENOX) injection  40 mg Subcutaneous Daily   gabapentin  600 mg Oral TID   insulin aspart  0-5 Units Subcutaneous QHS   insulin aspart  0-9 Units Subcutaneous TID WC   insulin aspart  10 Units Subcutaneous TID WC   insulin glargine-yfgn  35 Units Subcutaneous QHS   levothyroxine  50 mcg Oral QAC breakfast   mirtazapine  15 mg Oral QHS   neomycin-polymyxin b-dexamethasone  1 drop Both Eyes TID   potassium chloride  40 mEq Oral Daily   sodium chloride flush  3 mL Intravenous Q12H   sucralfate  1 g Oral TID WC & HS   terbinafine  1 Application Topical BID   Continuous Infusions:  ceFEPime (MAXIPIME) IV 2 g (08/09/23 1010)   lactated ringers     PRN Meds: acetaminophen **OR** acetaminophen, senna-docusate, traMADol, zolpidem  Allergies:    Allergies  Allergen Reactions   Morphine Sulfate Itching and Rash    Social History:   Social History   Socioeconomic History   Marital status: Married    Spouse name: Not on file   Number of children: 5   Years of education: 14   Highest education level: Associate degree: occupational, Scientist, product/process development, or vocational program  Occupational History   Occupation: retired    Associate Professor: RETIRED    Comment: army x 2 years; Designer, fashion/clothing; Personnel officer   Tobacco Use   Smoking status: Former    Current packs/day: 0.00    Average packs/day: 1 pack/day for 20.0 years (20.0 ttl pk-yrs)    Types: Cigarettes    Start date: 02/25/1961    Quit date: 02/25/1981    Years since quitting: 42.4   Smokeless tobacco: Never  Vaping Use   Vaping status: Never Used  Substance and Sexual Activity   Alcohol use: Not Currently    Comment: stopped drinking 76 or 77    Drug use: No   Sexual activity: Yes    Comment: Been able to utilize his penile prosthesis successfully  Other Topics Concern   Not on file  Social History Narrative   Married. 3 children from previous marriage, 2 step children. 15 grandkids.       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 could not, watch tv ([employer])   Social Determinants of Health   Financial Resource Strain: Low Risk  (05/19/2023)   Overall Financial Resource Strain (CARDIA)    Difficulty of Paying Living Expenses: Not hard at all  Food Insecurity: No Food Insecurity (08/09/2023)   Hunger Vital Sign    Worried About Running Out of Food in the Last Year: Never true    Ran Out of Food in the Last Year: Never true  Transportation Needs: No Transportation Needs (08/09/2023)   PRAPARE - Administrator, Civil Service (Medical): No    Lack of Transportation (Non-Medical): No  Physical Activity: Insufficiently Active (05/19/2023)   Exercise Vital Sign    Days of Exercise per Week: 2 days    Minutes of Exercise per Session: 30 min  Stress: No Stress Concern Present (05/19/2023)   [employer] of Occupational Health - Occupational Stress Questionnaire    Feeling of Stress : Not at all  Recent Concern: Stress - Stress Concern Present (04/11/2023)   [employer] of Occupational Health - Occupational Stress Questionnaire    Feeling of Stress : To some extent  Social Connections: Socially Integrated (05/19/2023)   Social Connection and Isolation Panel [NHANES]     Frequency of Communication with Friends and Family: Once a week    Frequency of Social Gatherings with Friends and Family: Twice a week    Attends Religious Services: More than 4 times per year    Active Member of [employer] or Organizations:  Yes    Attends Club or Organization Meetings: 1 to 4 times per year    Marital Status: Married  Catering manager Violence: Not At Risk (08/09/2023)   Humiliation, Afraid, Rape, and Kick questionnaire    Fear of Current or Ex-Partner: No    Emotionally Abused: No    Physically Abused: No    Sexually Abused: No    Family History:    Family History  Problem Relation Age of Onset   Hypertension Mother    Stomach cancer Mother 74   Alcohol abuse Brother    Diabetes Child    Hypertension Child    Hypertension Paternal Uncle    Heart attack Paternal Uncle    Parkinson's disease Other        several cousins and uncles   Colon cancer Neg Hx    Colon polyps Neg Hx    Rectal cancer Neg Hx      ROS:  Please see the history of present illness.   All other ROS reviewed and negative.     Physical Exam/Data:   Vitals:   08/09/23 0028 08/09/23 0112 08/09/23 0409 08/09/23 0836  BP:  130/67 (!) 133/55 136/79  Pulse:  72 67 81  Resp:  18  19  Temp: 99.2 F (37.3 C) 98 F (36.7 C) 98.8 F (37.1 C) 99.2 F (37.3 C)  TempSrc: Rectal  Oral Oral  SpO2:  95% 96% 95%  Weight:  119.7 kg    Height:  6' (1.829 m)      Intake/Output Summary (Last 24 hours) at 08/09/2023 1218 Last data filed at 08/09/2023 1145 Gross per 24 hour  Intake 942.96 ml  Output 450 ml  Net 492.96 ml      08/09/2023    1:12 AM 07/07/2023   10:11 AM 05/19/2023   10:33 AM  Last 3 Weights  Weight (lbs) 263 lb 14.3 oz 262 lb 4 oz 254 lb  Weight (kg) 119.7 kg 118.956 kg 115.214 kg     Body mass index is 35.79 kg/m.  General:  in no acute distress HEENT: normal Neck: no JVD Vascular: No carotid bruits; Distal pulses 2+ bilaterally Cardiac:  normal S1, S2; RRR; no murmur  Lungs:   clear to auscultation bilaterally, no wheezing, rhonchi or rales  Ext: no edema Musculoskeletal:  No deformities, BUE and BLE strength normal and equal Skin: warm and dry  Neuro:  CNs 2-12 intact, no focal abnormalities noted Psych:  Normal affect   EKG:  The EKG was personally reviewed and demonstrates: Normal sinus rhythm with PACs Telemetry:  Telemetry was personally reviewed and demonstrates: Normal sinus rhythm with PACs  Relevant CV Studies:   Laboratory Data:  High Sensitivity Troponin:   Recent Labs  Lab 08/08/23 1830 08/08/23 2010  TROPONINIHS 13 15     Chemistry Recent Labs  Lab 08/08/23 1830 08/09/23 0012  NA 136 136  K 4.0 3.3*  CL 101 102  CO2 18* 21*  GLUCOSE 173* 130*  BUN 13 14  CREATININE 1.35* 1.21  CALCIUM 8.6* 7.9*  MG  --  1.7  GFRNONAA 53* >60  ANIONGAP 17* 13    No results for input(s): "PROT", "ALBUMIN", "AST", "ALT", "ALKPHOS", "BILITOT" in the last 168 hours. Lipids No results for input(s): "CHOL", "TRIG", "HDL", "LABVLDL", "LDLCALC", "CHOLHDL" in the last 168 hours.  Hematology Recent Labs  Lab 08/08/23 1830 08/09/23 0012  WBC 46.5* 41.0*  RBC 5.17 4.66  HGB 14.3 12.9*  HCT 43.5 40.0  MCV 84.1 85.8  MCH 27.7 27.7  MCHC 32.9 32.3  RDW 14.9 15.2  PLT 172 141*   Thyroid No results for input(s): "TSH", "FREET4" in the last 168 hours.  BNPNo results for input(s): "BNP", "PROBNP" in the last 168 hours.  DDimer No results for input(s): "DDIMER" in the last 168 hours.   Radiology/Studies:  ECHOCARDIOGRAM COMPLETE  Result Date: 08/09/2023    ECHOCARDIOGRAM REPORT   Patient Name:   KODE HEMMEN Date of Exam: 08/09/2023 Medical Rec #:  [id number]       Height:       72.0 in Accession #:    [id number]      Weight:       263.9 lb Date of Birth:  22-Oct-1941       BSA:          2.397 m Patient Age:    81 years        BP:           136/79 mmHg Patient Gender: M               HR:           84 bpm. Exam Location:  Inpatient Procedure: 2D Echo,  Cardiac Doppler and Color Doppler Indications:    Heart block. Left bundle branch block.  History:        Patient has prior history of Echocardiogram examinations, most                 recent 10/16/2015. Chronic kidney disease. Parkinson's.,                 Arrythmias:LBBB and Atrial Fibrillation; Risk                 Factors:Hypertension, Dyslipidemia, Diabetes and Sleep Apnea.  Sonographer:    Delcie Roch RDCS Referring Phys: [id number] TIMOTHY S OPYD IMPRESSIONS  1. Abnormal septal motion . Left ventricular ejection fraction, by estimation, is 55 to 60%. The left ventricle has normal function. The left ventricle has no regional wall motion abnormalities. Left ventricular diastolic parameters are indeterminate.  2. Right ventricular systolic function is normal. The right ventricular size is normal. There is normal pulmonary artery systolic pressure.  3. Left atrial size was moderately dilated.  4. The mitral valve is abnormal. Trivial mitral valve regurgitation. No evidence of mitral stenosis.  5. The aortic valve is tricuspid. There is mild calcification of the aortic valve. Aortic valve regurgitation is not visualized. Aortic valve sclerosis is present, with no evidence of aortic valve stenosis.  6. The inferior vena cava is normal in size with greater than 50% respiratory variability, suggesting right atrial pressure of 3 mmHg. FINDINGS  Left Ventricle: Abnormal septal motion. Left ventricular ejection fraction, by estimation, is 55 to 60%. The left ventricle has normal function. The left ventricle has no regional wall motion abnormalities. The left ventricular internal cavity size was normal in size. There is no left ventricular hypertrophy. Left ventricular diastolic parameters are indeterminate. Right Ventricle: The right ventricular size is normal. No increase in right ventricular wall thickness. Right ventricular systolic function is normal. There is normal pulmonary artery systolic pressure. The  tricuspid regurgitant velocity is 2.49 m/s, and  with an assumed right atrial pressure of 3 mmHg, the estimated right ventricular systolic pressure is 27.8 mmHg. Left Atrium: Left atrial size was moderately dilated. Right Atrium: Right atrial size was normal in size. Pericardium: There is no evidence of pericardial effusion. Mitral Valve:  The mitral valve is abnormal. There is mild thickening of the mitral valve leaflet(s). Trivial mitral valve regurgitation. No evidence of mitral valve stenosis. Tricuspid Valve: The tricuspid valve is normal in structure. Tricuspid valve regurgitation is mild . No evidence of tricuspid stenosis. Aortic Valve: The aortic valve is tricuspid. There is mild calcification of the aortic valve. Aortic valve regurgitation is not visualized. Aortic valve sclerosis is present, with no evidence of aortic valve stenosis. Aortic valve mean gradient measures 10.0 mmHg. Aortic valve peak gradient measures 17.8 mmHg. Aortic valve area, by VTI measures 2.10 cm. Pulmonic Valve: The pulmonic valve was normal in structure. Pulmonic valve regurgitation is trivial. No evidence of pulmonic stenosis. Aorta: The aortic root is normal in size and structure. Venous: The inferior vena cava is normal in size with greater than 50% respiratory variability, suggesting right atrial pressure of 3 mmHg. IAS/Shunts: No atrial level shunt detected by color flow Doppler.  LEFT VENTRICLE PLAX 2D LVIDd:         5.40 cm LVIDs:         3.70 cm LV PW:         1.10 cm LV IVS:        1.00 cm LVOT diam:     2.10 cm LV SV:         87 LV SV Index:   36 LVOT Area:     3.46 cm  RIGHT VENTRICLE             IVC RV Basal diam:  2.70 cm     IVC diam: 1.70 cm RV S prime:     13.90 cm/s TAPSE (M-mode): 2.0 cm LEFT ATRIUM             Index        RIGHT ATRIUM           Index LA diam:        4.40 cm 1.84 cm/m   RA Area:     16.80 cm LA Vol (A2C):   71.7 ml 29.91 ml/m  RA Volume:   41.90 ml  17.48 ml/m LA Vol (A4C):   63.9 ml 26.66  ml/m LA Biplane Vol: 71.2 ml 29.70 ml/m  AORTIC VALVE AV Area (Vmax):    2.13 cm AV Area (Vmean):   2.05 cm AV Area (VTI):     2.10 cm AV Vmax:           211.00 cm/s AV Vmean:          144.500 cm/s AV VTI:            0.412 m AV Peak Grad:      17.8 mmHg AV Mean Grad:      10.0 mmHg LVOT Vmax:         129.60 cm/s LVOT Vmean:        85.720 cm/s LVOT VTI:          0.250 m LVOT/AV VTI ratio: 0.61  AORTA Ao Root diam: 3.40 cm Ao Asc diam:  3.50 cm TRICUSPID VALVE TR Peak grad:   24.8 mmHg TR Vmax:        249.00 cm/s  SHUNTS Systemic VTI:  0.25 m Systemic Diam: 2.10 cm Charlton Haws MD Electronically signed by Charlton Haws MD Signature Date/Time: 08/09/2023/10:46:31 AM    Final    DG Chest Port 1 View  Result Date: 08/08/2023 CLINICAL DATA:  Atrial fibrillation EXAM: PORTABLE CHEST 1 VIEW COMPARISON:  12/23/2020 FINDINGS: Lungs volumes are small,  but are symmetric and are clear. No pneumothorax or pleural effusion. Cardiac size within normal limits. Pulmonary vascularity is normal. Osseous structures are age-appropriate. No acute bone abnormality. IMPRESSION: 1. Pulmonary hypoinflation. Electronically Signed   By: Helyn Numbers M.D.   On: 08/08/2023 21:06     Assessment and Plan:   ?Atrial flutter: There was concern for possible atrial flutter but on review appears sinus with PACs, no A-fib/flutter seen  LBBB: Echo shows EF 55 to 60%, normal RV function.  No further cardiac workup recommended  AKI: Creatinine 1.35 on presentation, improving with hydration  SIRS: Workup per primary team  For questions or updates, please contact [facility] Please consult [email or web address] for contact info under    Signed, Little Ishikawa, MD  08/09/2023 12:18 PM

## 2023-08-09 NOTE — Progress Notes (Signed)
Pt off unit with transport to ECHO at this time.

## 2023-08-09 NOTE — Plan of Care (Signed)
  Problem: Education: Goal: Ability to describe self-care measures that may prevent or decrease complications (Diabetes Survival Skills Education) will improve Outcome: Progressing Goal: Individualized Educational Video(s) Outcome: Progressing   Problem: Coping: Goal: Ability to adjust to condition or change in health will improve Outcome: Progressing   Problem: Fluid Volume: Goal: Ability to maintain a balanced intake and output will improve Outcome: Progressing   Problem: Health Behavior/Discharge Planning: Goal: Ability to identify and utilize available resources and services will improve Outcome: Progressing Goal: Ability to manage health-related needs will improve Outcome: Progressing   Problem: Nutritional: Goal: Maintenance of adequate nutrition will improve Outcome: Progressing   Problem: Skin Integrity: Goal: Risk for impaired skin integrity will decrease Outcome: Progressing

## 2023-08-10 DIAGNOSIS — R6510 Systemic inflammatory response syndrome (SIRS) of non-infectious origin without acute organ dysfunction: Secondary | ICD-10-CM | POA: Diagnosis not present

## 2023-08-10 LAB — BASIC METABOLIC PANEL
Anion gap: 11 (ref 5–15)
BUN: 12 mg/dL (ref 8–23)
CO2: 21 mmol/L — ABNORMAL LOW (ref 22–32)
Calcium: 7.8 mg/dL — ABNORMAL LOW (ref 8.9–10.3)
Chloride: 105 mmol/L (ref 98–111)
Creatinine, Ser: 0.93 mg/dL (ref 0.61–1.24)
GFR, Estimated: >60 mL/min (ref >60)
Glucose, Bld: 140 mg/dL — ABNORMAL HIGH (ref 70–99)
Potassium: 3.7 mmol/L (ref 3.5–5.1)
Sodium: 137 mmol/L (ref 135–145)

## 2023-08-10 LAB — CBC
HCT: 38.9 % — ABNORMAL LOW (ref 39.0–52.0)
Hemoglobin: 12.4 g/dL — ABNORMAL LOW (ref 13.0–17.0)
MCH: 28.2 pg (ref 26.0–34.0)
MCHC: 31.9 g/dL (ref 30.0–36.0)
MCV: 88.6 fL (ref 80.0–100.0)
Platelets: 123 10*3/uL — ABNORMAL LOW (ref 150–400)
RBC: 4.39 MIL/uL (ref 4.22–5.81)
RDW: 15.3 % (ref 11.5–15.5)
WBC: 31.9 10*3/uL — ABNORMAL HIGH (ref 4.0–10.5)
nRBC: 0 % (ref 0.0–0.2)

## 2023-08-10 LAB — GLUCOSE, CAPILLARY
Glucose-Capillary: 171 mg/dL — ABNORMAL HIGH (ref 70–99)
Glucose-Capillary: 109 mg/dL — ABNORMAL HIGH (ref 70–99)

## 2023-08-10 MED ORDER — SODIUM CHLORIDE 0.9 % IV SOLN: 2.0000 g | Freq: Three times a day (TID) | INTRAVENOUS | Status: DC

## 2023-08-10 NOTE — TOC Transition Note (Signed)
Transition of Care [facility]) - CM/SW Discharge Note   Patient Details  Name: [name] MRN: [id number] Date of Birth: Sep 16, 1941  [facility]) CM/SW Contact:  Lawerance Sabal, RN Phone Number: 08/10/2023, 12:27 PM   Clinical Narrative:     Sherron Monday w patient and wife over the phone, they are agreeable to [facility] services. They would like to restart services with [facility].  Referral given  to liaison, [name].  They state they have all needed DME at home.  No other TOC needs at this time   Final next level of care: Home w Home Health Services Barriers to Discharge: No Barriers Identified   Patient Goals and CMS Choice CMS Medicare.gov Compare Post Acute Care list provided to:: Patient Choice offered to / list presented to : Patient  Discharge Placement                         Discharge Plan and Services Additional resources added to the After Visit Summary for                  DME Arranged: N/A         HH Arranged: PT, OT HH Agency: [facility] Date [facility] Agency Contacted: 08/10/23 Time HH Agency Contacted: 1227 Representative spoke with at [facility] Agency: [name]  Social Determinants of Health (SDOH) Interventions SDOH Screenings   Food Insecurity: No Food Insecurity (08/09/2023)  Housing: Low Risk  (08/09/2023)  Transportation Needs: No Transportation Needs (08/09/2023)  Utilities: Not At Risk (08/09/2023)  Depression (PHQ2-9): Low Risk  (05/19/2023)  Financial Resource Strain: Low Risk  (05/19/2023)  Physical Activity: Unknown (08/08/2023)   Received from [facility]  Recent Concern: Physical Activity - Insufficiently Active (05/19/2023)  Social Connections: Moderately Integrated (08/08/2023)   [facility] from [facility]  Stress: No Stress Concern Present (08/08/2023)   [facility]  Tobacco Use: Medium Risk (08/08/2023)     Readmission Risk Interventions     No data to display

## 2023-08-10 NOTE — Evaluation (Signed)
Physical Therapy Brief Evaluation and Discharge Note Patient Details Name: [name] MRN: [id number] DOB: 11/28/1941 Today's Date: 08/10/2023   History of Present Illness  [name] is a 82 y.o. male who presented after a fall with general weakness and fever with concern for sepsis. Workup pending. PMHx: hypertension, type 2 diabetes, CLL under surveillance, Parkinson's, sleep apnea and prostate cancer  Clinical Impression  Pt doing fairly well with mobility and eager to return home.  Ready for dc from PT standpoint. Wife reports pt does well with therapy but once therapy is done he is very sedentary.         PT Assessment All further PT needs can be met in the next venue of care  Assistance Needed at Discharge  Intermittent Supervision/Assistance    Equipment Recommendations None recommended by PT  Recommendations for Other Services       Precautions/Restrictions Precautions Precautions: Fall Restrictions Weight Bearing Restrictions: No        Mobility  Bed Mobility       General bed mobility comments: Pt sitting EOB  Transfers Overall transfer level: Needs assistance Equipment used: Rollator (4 wheels), None Transfers: Sit to/from Stand Sit to Stand: Supervision           General transfer comment: supervision for safety    Ambulation/Gait Ambulation/Gait assistance: Supervision Gait Distance (Feet): 250 Feet Assistive device: Rollator (4 wheels), None Gait Pattern/deviations: Step-through pattern, Decreased step length - right, Decreased step length - left, Trunk flexed Gait Speed: Below normal General Gait Details: Supervision for safety with and without rollator. No loss of balance  Home Activity Instructions    Stairs            Modified Rankin (Stroke Patients Only)        Balance Overall balance assessment: Needs assistance Sitting-balance support: No upper extremity supported, Feet supported Sitting balance-Leahy Scale:  Normal     Standing balance support: No upper extremity supported, During functional activity Standing balance-Leahy Scale: Good            Pertinent Vitals/Pain PT - Brief Vital Signs All Vital Signs Stable: Yes Pain Assessment Pain Assessment: No/denies pain     Home Living Family/patient expects to be discharged to:: Private residence Living Arrangements: Spouse/significant other Available Help at Discharge: Family;Available 24 hours/day Home Environment: Level entry   Home Equipment: Agricultural consultant (2 wheels);Rollator (4 wheels);Cane - single point;BSC/3in1;Shower seat;Grab bars - toilet;Grab bars - tub/shower;Electric scooter        Prior Function Level of Independence: Independent      UE/LE Assessment   UE ROM/Strength/Tone/Coordination: WFL    LE ROM/Strength/Tone/Coordination: Generalized weakness      Communication   Communication Communication: No apparent difficulties     Cognition Overall Cognitive Status: Appears within functional limits for tasks assessed/performed       General Comments      Exercises     Assessment/Plan    PT Problem List Decreased strength;Decreased balance       PT Visit Diagnosis Muscle weakness (generalized) (M62.81)    No Skilled PT     Co-evaluation                AMPAC 6 Clicks Help needed turning from your back to your side while in a flat bed without using bedrails?: None Help needed moving from lying on your back to sitting on the side of a flat bed without using bedrails?: None Help needed moving to and from a bed  to a chair (including a wheelchair)?: A Little Help needed standing up from a chair using your arms (e.g., wheelchair or bedside chair)?: A Little Help needed to walk in hospital room?: A Little Help needed climbing 3-5 steps with a railing? : A Little 6 Click Score: 20      End of Session   Activity Tolerance: Patient tolerated treatment well Patient left: in bed;with call  bell/phone within reach;with family/visitor present Nurse Communication: Mobility status PT Visit Diagnosis: Muscle weakness (generalized) (M62.81)     Time: 7829-5621 PT Time Calculation (min) (ACUTE ONLY): 8 min  Charges:   PT Evaluation $PT Eval Low Complexity: 1 Low      [facility] PT Acute Rehabilitation Services Office [phone]   Angelina Ok [facility]  08/10/2023, 12:27 PM

## 2023-08-10 NOTE — Discharge Summary (Signed)
Physician Discharge Summary  [name] XBM:[id number] DOB: 1941-07-26 DOA: 08/08/2023  PCP: Shelva Majestic, MD  Admit date: 08/08/2023 Discharge date: 08/10/2023  Admitted From: Home Disposition: Home with home health  Recommendations for Outpatient Follow-up:  Follow up with PCP in 1-2 weeks Please obtain BMP/CBC in one week   Home Health: PT/OT Equipment/Devices: Available at home  Discharge Condition: Stable CODE STATUS: Full code Diet recommendation: Low-salt and low-carb diet  Discharge summary: 82 year old gentleman with history of hypertension, type 2 diabetes, CLL under surveillance, Parkinson's, sleep apnea and prostate cancer presented with generalized weakness, low-grade fever after receiving COVID-19 vaccine 1 day prior to admit.  Lives at home with wife.  He was extremely weak, wife called EMS and they found him with A-fib and tachycardia however he declined to be transported to ER with EMS.  Does not like to come to the hospital.  He continued to look weaker, his wife brought him to ER in private vehicle.  In the emergency room, low-grade fever, on room air.  Mildly tachypneic.  Blood pressure stable.  EKG showed a flutter with left bundle branch block.  Chest x-ray normal.  COVID-19 and influenza swab negative.  White count 46,000 with baseline about 38,000.  Blood cultures were collected, he was started on antibiotics.  Initially presented with possible infection, ruled out.  Did not show any evidence of infection. His heart rhythm proved to be benign rhythm with PVCs.  No evidence of A-fib or flutter while in the hospital.   SIRS, no clear source of bacterial infection.  Patient did present with low-grade fever, tachypnea and increased leukocytosis from baseline as well as AKI.  This could be from hemoconcentration. Possible viral syndrome.  Received COVID-19 vaccine 1 day prior. Blood cultures and urine was normal.  Chest x-ray normal.  Did not show any evidence of  ongoing infection.  Antibiotics were discontinued.   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  Already improving.  Can go back on losartan and hydrochlorothiazide.    Suspected new onset a flutter and left bundle branch block:  Seen by cardiology.  Careful review of rhythm was showing PVCs, regular rhythm.  Noted no evidence of A-fib or a flutter as per cardiology.  He might have some shaky artifacts due to baseline tremors.  Echocardiogram with normal ejection fraction.  Rate controlled.   Cardiology will schedule follow-up for ambulatory monitoring of rhythm.     Type 2 diabetes, insulin-dependent.  Well-controlled.  On insulin.  Continue.  Essential hypertension, stable on Norvasc.  Losartan hydrochlorothiazide can be resumed on discharge.  Sleep apnea, CPAP at night  CLL, under surveillance.  Parkinson's disease, followed by neurology as outpatient.  Patient medically stabilized.  Remains weak and debilitated.  Will benefit with home health PT OT.    Discharge Diagnoses:  Principal Problem:   SIRS (systemic inflammatory response syndrome) (HCC) Active Problems:   Type 2 diabetes mellitus   OSA on CPAP   Essential hypertension   Chronic lymphocytic leukemia (HCC)   Hypothyroidism   Parkinson's disease   AKI (acute kidney injury) (HCC)   LBBB (left bundle branch block)   Irregular cardiac rhythm    Discharge Instructions  Discharge Instructions     Diet - low sodium heart healthy   Complete by: As directed    Diet Carb Modified   Complete by: As directed    Increase activity slowly   Complete by: As directed  Allergies as of 08/10/2023       Reactions   Morphine Sulfate Itching, Rash        Medication List     TAKE these medications    amLODipine 10 MG tablet Commonly known as: NORVASC Take 10 mg by mouth daily.   busPIRone 10 MG tablet Commonly known as: BUSPAR Take by mouth 3 (three) times daily.   empagliflozin 25 MG  Tabs tablet Commonly known as: Jardiance Take 1 tablet (25 mg total) by mouth daily before breakfast. (GIVEN BY [facility]) What changed: how much to take   fenofibrate 145 MG tablet Commonly known as: TRICOR Take 160 mg by mouth daily.   gabapentin 300 MG capsule Commonly known as: NEURONTIN Take 600 mg by mouth 3 (three) times daily.   glucose blood test strip Use to test 4 times daily. E11.9   hydrochlorothiazide 25 MG tablet Commonly known as: HYDRODIURIL Take 25 mg by mouth daily.   insulin aspart 100 UNIT/ML injection Commonly known as: novoLOG Inject 35 Units into the skin 3 (three) times daily with meals. When sugar is elevated   insulin glargine 100 UNIT/ML injection Commonly known as: LANTUS Inject 50 Units into the skin at bedtime.   levothyroxine 50 MCG tablet Commonly known as: SYNTHROID Take 50 mcg by mouth daily before breakfast.   losartan 100 MG tablet Commonly known as: COZAAR Take 100 mg by mouth daily.   mirtazapine 15 MG tablet Commonly known as: REMERON Take 15 mg by mouth at bedtime.   neomycin-polymyxin b-dexamethasone 3.5-10000-0.1 Susp Commonly known as: MAXITROL Place 1 drop into both eyes 3 (three) times daily.   OVER THE COUNTER MEDICATION PreserVision for eyes   potassium chloride SA 20 MEQ tablet Commonly known as: KLOR-CON M Take 20 mEq by mouth daily.   simvastatin 80 MG tablet Commonly known as: ZOCOR Take 40 mg by mouth daily at 6 PM. Take half tablet (40 mg) by mouth once daily   sucralfate 1 g tablet Commonly known as: CARAFATE Take 1 g by mouth 4 (four) times daily -  with meals and at bedtime.   terbinafine 1 % cream Commonly known as: LAMISIL Apply 1 Application topically 2 (two) times daily.   traMADol 50 MG tablet Commonly known as: ULTRAM Take 50 mg by mouth every 6 (six) hours as needed.   Vitamin D3 50 MCG (2000 UT) Tabs Take 1 tablet by mouth daily.   zolpidem 5 MG tablet Commonly known as: AMBIEN Take 5 mg  by mouth at bedtime as needed for sleep.        Follow-up Information     [facility] Follow up.   Contact information: [address] [place] [address] [phone]                Allergies  Allergen Reactions   Morphine Sulfate Itching and Rash    Consultations: Cardiology   Procedures/Studies: ECHOCARDIOGRAM COMPLETE  Result Date: 08/09/2023    ECHOCARDIOGRAM REPORT   Patient Name:   THELTON MOFFITT Date of Exam: 08/09/2023 Medical Rec #:  [id number]       Height:       72.0 in Accession #:    [id number]      Weight:       263.9 lb Date of Birth:  1941/03/03       BSA:          2.397 m Patient Age:    53 years  BP:           136/79 mmHg Patient Gender: M               HR:           84 bpm. Exam Location:  Inpatient Procedure: 2D Echo, Cardiac Doppler and Color Doppler Indications:    Heart block. Left bundle branch block.  History:        Patient has prior history of Echocardiogram examinations, most                 recent 10/16/2015. Chronic kidney disease. Parkinson's.,                 Arrythmias:LBBB and Atrial Fibrillation; Risk                 Factors:Hypertension, Dyslipidemia, Diabetes and Sleep Apnea.  Sonographer:    Delcie Roch RDCS Referring Phys: [id number] TIMOTHY S OPYD IMPRESSIONS  1. Abnormal septal motion . Left ventricular ejection fraction, by estimation, is 55 to 60%. The left ventricle has normal function. The left ventricle has no regional wall motion abnormalities. Left ventricular diastolic parameters are indeterminate.  2. Right ventricular systolic function is normal. The right ventricular size is normal. There is normal pulmonary artery systolic pressure.  3. Left atrial size was moderately dilated.  4. The mitral valve is abnormal. Trivial mitral valve regurgitation. No evidence of mitral stenosis.  5. The aortic valve is tricuspid. There is mild calcification of the aortic valve. Aortic valve regurgitation is not  visualized. Aortic valve sclerosis is present, with no evidence of aortic valve stenosis.  6. The inferior vena cava is normal in size with greater than 50% respiratory variability, suggesting right atrial pressure of 3 mmHg. FINDINGS  Left Ventricle: Abnormal septal motion. Left ventricular ejection fraction, by estimation, is 55 to 60%. The left ventricle has normal function. The left ventricle has no regional wall motion abnormalities. The left ventricular internal cavity size was normal in size. There is no left ventricular hypertrophy. Left ventricular diastolic parameters are indeterminate. Right Ventricle: The right ventricular size is normal. No increase in right ventricular wall thickness. Right ventricular systolic function is normal. There is normal pulmonary artery systolic pressure. The tricuspid regurgitant velocity is 2.49 m/s, and  with an assumed right atrial pressure of 3 mmHg, the estimated right ventricular systolic pressure is 27.8 mmHg. Left Atrium: Left atrial size was moderately dilated. Right Atrium: Right atrial size was normal in size. Pericardium: There is no evidence of pericardial effusion. Mitral Valve: The mitral valve is abnormal. There is mild thickening of the mitral valve leaflet(s). Trivial mitral valve regurgitation. No evidence of mitral valve stenosis. Tricuspid Valve: The tricuspid valve is normal in structure. Tricuspid valve regurgitation is mild . No evidence of tricuspid stenosis. Aortic Valve: The aortic valve is tricuspid. There is mild calcification of the aortic valve. Aortic valve regurgitation is not visualized. Aortic valve sclerosis is present, with no evidence of aortic valve stenosis. Aortic valve mean gradient measures 10.0 mmHg. Aortic valve peak gradient measures 17.8 mmHg. Aortic valve area, by VTI measures 2.10 cm. Pulmonic Valve: The pulmonic valve was normal in structure. Pulmonic valve regurgitation is trivial. No evidence of pulmonic stenosis. Aorta:  The aortic root is normal in size and structure. Venous: The inferior vena cava is normal in size with greater than 50% respiratory variability, suggesting right atrial pressure of 3 mmHg. IAS/Shunts: No atrial level shunt detected by color flow Doppler.  LEFT  VENTRICLE PLAX 2D LVIDd:         5.40 cm LVIDs:         3.70 cm LV PW:         1.10 cm LV IVS:        1.00 cm LVOT diam:     2.10 cm LV SV:         87 LV SV Index:   36 LVOT Area:     3.46 cm  RIGHT VENTRICLE             IVC RV Basal diam:  2.70 cm     IVC diam: 1.70 cm RV S prime:     13.90 cm/s TAPSE (M-mode): 2.0 cm LEFT ATRIUM             Index        RIGHT ATRIUM           Index LA diam:        4.40 cm 1.84 cm/m   RA Area:     16.80 cm LA Vol (A2C):   71.7 ml 29.91 ml/m  RA Volume:   41.90 ml  17.48 ml/m LA Vol (A4C):   63.9 ml 26.66 ml/m LA Biplane Vol: 71.2 ml 29.70 ml/m  AORTIC VALVE AV Area (Vmax):    2.13 cm AV Area (Vmean):   2.05 cm AV Area (VTI):     2.10 cm AV Vmax:           211.00 cm/s AV Vmean:          144.500 cm/s AV VTI:            0.412 m AV Peak Grad:      17.8 mmHg AV Mean Grad:      10.0 mmHg LVOT Vmax:         129.60 cm/s LVOT Vmean:        85.720 cm/s LVOT VTI:          0.250 m LVOT/AV VTI ratio: 0.61  AORTA Ao Root diam: 3.40 cm Ao Asc diam:  3.50 cm TRICUSPID VALVE TR Peak grad:   24.8 mmHg TR Vmax:        249.00 cm/s  SHUNTS Systemic VTI:  0.25 m Systemic Diam: 2.10 cm Charlton Haws MD Electronically signed by Charlton Haws MD Signature Date/Time: 08/09/2023/10:46:31 AM    Final    DG Chest Port 1 View  Result Date: 08/08/2023 CLINICAL DATA:  Atrial fibrillation EXAM: PORTABLE CHEST 1 VIEW COMPARISON:  12/23/2020 FINDINGS: Lungs volumes are small, but are symmetric and are clear. No pneumothorax or pleural effusion. Cardiac size within normal limits. Pulmonary vascularity is normal. Osseous structures are age-appropriate. No acute bone abnormality. IMPRESSION: 1. Pulmonary hypoinflation. Electronically Signed   By: Helyn Numbers M.D.   On: 08/08/2023 21:06   (Echo, Carotid, EGD, Colonoscopy, ERCP)    Subjective: Patient seen in the morning rounds.  Denies any complaints.  Wife at the bedside.  Patient is eager to go home and each encounter.  Telemetry monitor with mostly sinus rhythm.   Discharge Exam: Vitals:   08/10/23 0319 08/10/23 0837  BP: 120/63 138/72  Pulse: 64 71  Resp:  18  Temp: 98.3 F (36.8 C) 97.9 F (36.6 C)  SpO2: 93% 98%   Vitals:   08/09/23 1551 08/09/23 2026 08/10/23 0319 08/10/23 0837  BP: 127/70 132/71 120/63 138/72  Pulse: 74 78 64 71  Resp:    18  Temp:  98.5 F (  36.9 C) 98.3 F (36.8 C) 97.9 F (36.6 C)  TempSrc:  Oral Oral   SpO2: 94% 95% 93% 98%  Weight:      Height:        General: Pt is alert, awake, not in acute distress, he does have mild resting tremors. Cardiovascular: RRR, S1/S2 +, no rubs, no gallops Respiratory: CTA bilaterally, no wheezing, no rhonchi Abdominal: Soft, NT, ND, bowel sounds + Extremities: no edema, no cyanosis    The results of significant diagnostics from this hospitalization (including imaging, microbiology, ancillary and laboratory) are listed below for reference.     Microbiology: Recent Results (from the past 240 hour(s))  SARS Coronavirus 2 by RT PCR (hospital order, performed in [facility] hospital lab) *cepheid single result test* Anterior Nasal Swab     Status: None   Collection Time: 08/08/23  8:28 PM   Specimen: Anterior Nasal Swab  Result Value Ref Range Status   SARS Coronavirus 2 by RT PCR NEGATIVE NEGATIVE Final    Comment: Performed at [facility], [address] [place], [place] [address]  Blood culture (routine x 2)     Status: None (Preliminary result)   Collection Time: 08/08/23  9:36 PM   Specimen: BLOOD  Result Value Ref Range Status   Specimen Description BLOOD LEFT ANTECUBITAL  Final   Special Requests   Final    BOTTLES DRAWN AEROBIC AND ANAEROBIC Blood Culture results may not be optimal due  to an excessive volume of blood received in culture bottles   Culture   Final    NO GROWTH 2 DAYS Performed at [facility], [address] [place], [place] [address]    Report Status PENDING  Incomplete  Blood culture (routine x 2)     Status: None (Preliminary result)   Collection Time: 08/08/23 10:14 PM   Specimen: BLOOD  Result Value Ref Range Status   Specimen Description BLOOD SITE NOT SPECIFIED  Final   Special Requests   Final    BOTTLES DRAWN AEROBIC AND ANAEROBIC Blood Culture results may not be optimal due to an inadequate volume of blood received in culture bottles   Culture   Final    NO GROWTH 2 DAYS Performed at [facility], [address], [place], [place] [address]    Report Status PENDING  Incomplete     Labs: BNP (last 3 results) No results for input(s): "BNP" in the last 8760 hours. Basic Metabolic Panel: Recent Labs  Lab 08/08/23 1830 08/09/23 0012 08/10/23 0727  NA 136 136 137  K 4.0 3.3* 3.7  CL 101 102 105  CO2 18* 21* 21*  GLUCOSE 173* 130* 140*  BUN 13 14 12   CREATININE 1.35* 1.21 0.93  CALCIUM 8.6* 7.9* 7.8*  MG  --  1.7  --    Liver Function Tests: No results for input(s): "AST", "ALT", "ALKPHOS", "BILITOT", "PROT", "ALBUMIN" in the last 168 hours. No results for input(s): "LIPASE", "AMYLASE" in the last 168 hours. No results for input(s): "AMMONIA" in the last 168 hours. CBC: Recent Labs  Lab 08/08/23 1830 08/09/23 0012 08/10/23 0727  WBC 46.5* 41.0* 31.9*  HGB 14.3 12.9* 12.4*  HCT 43.5 40.0 38.9*  MCV 84.1 85.8 88.6  PLT 172 141* 123*   Cardiac Enzymes: No results for input(s): "CKTOTAL", "CKMB", "CKMBINDEX", "TROPONINI" in the last 168 hours. BNP: Invalid input(s): "POCBNP" CBG: Recent Labs  Lab 08/09/23 1144 08/09/23 1700 08/09/23 2028 08/10/23 0836 08/10/23 1222  GLUCAP 133* 96 136* 171* 109*   D-Dimer No results for input(s): "DDIMER" in the last 72 hours. Hgb A1c No results for input(s): "HGBA1C"  in the last 72 hours. Lipid Profile No results for input(s): "CHOL", "HDL", "LDLCALC", "TRIG", "CHOLHDL", "LDLDIRECT" in the last 72 hours. Thyroid function studies No results for input(s): "TSH", "T4TOTAL", "T3FREE", "THYROIDAB" in the last 72 hours.  Invalid input(s): "FREET3" Anemia work up No results for input(s): "VITAMINB12", "FOLATE", "FERRITIN", "TIBC", "IRON", "RETICCTPCT" in the last 72 hours. Urinalysis    Component Value Date/Time   COLORURINE YELLOW 08/09/2023 0014   APPEARANCEUR CLEAR 08/09/2023 0014   LABSPEC 1.030 08/09/2023 0014   PHURINE 5.0 08/09/2023 0014   GLUCOSEU >=500 (A) 08/09/2023 0014   HGBUR NEGATIVE 08/09/2023 0014   BILIRUBINUR NEGATIVE 08/09/2023 0014   BILIRUBINUR Negative 05/02/2020 1341   KETONESUR 20 (A) 08/09/2023 0014   PROTEINUR 30 (A) 08/09/2023 0014   UROBILINOGEN 0.2 05/02/2020 1341   UROBILINOGEN 1.0 01/29/2008 1400   NITRITE NEGATIVE 08/09/2023 0014   LEUKOCYTESUR NEGATIVE 08/09/2023 0014   Sepsis Labs Recent Labs  Lab 08/08/23 1830 08/09/23 0012 08/10/23 0727  WBC 46.5* 41.0* 31.9*   Microbiology Recent Results (from the past 240 hour(s))  SARS Coronavirus 2 by RT PCR (hospital order, performed in [facility] hospital lab) *cepheid single result test* Anterior Nasal Swab     Status: None   Collection Time: 08/08/23  8:28 PM   Specimen: Anterior Nasal Swab  Result Value Ref Range Status   SARS Coronavirus 2 by RT PCR NEGATIVE NEGATIVE Final    Comment: Performed at [facility], [address] [place], [place] [address]  Blood culture (routine x 2)     Status: None (Preliminary result)   Collection Time: 08/08/23  9:36 PM   Specimen: BLOOD  Result Value Ref Range Status   Specimen Description BLOOD LEFT ANTECUBITAL  Final   Special Requests   Final    BOTTLES DRAWN AEROBIC AND ANAEROBIC Blood Culture results may not be optimal due to an excessive volume of blood received in culture bottles   Culture   Final    NO  GROWTH 2 DAYS Performed at [facility], [address], [place], [place] [address]    Report Status PENDING  Incomplete  Blood culture (routine x 2)     Status: None (Preliminary result)   Collection Time: 08/08/23 10:14 PM   Specimen: BLOOD  Result Value Ref Range Status   Specimen Description BLOOD SITE NOT SPECIFIED  Final   Special Requests   Final    BOTTLES DRAWN AEROBIC AND ANAEROBIC Blood Culture results may not be optimal due to an inadequate volume of blood received in culture bottles   Culture   Final    NO GROWTH 2 DAYS Performed at [facility] [place] [facility], [address] [place], [place] [address]    Report Status PENDING  Incomplete     Time coordinating discharge:  32 minutes  SIGNED:   Dorcas Carrow, MD  [facility] 08/10/2023, 1:54 PM

## 2023-08-10 NOTE — Progress Notes (Signed)
No Afib/flutter seen this admission.  Will plan outpatient f/u and plan monitor as outpatient.  Cardiology will sign off at this time, please call if any issues arise.  Little Ishikawa, MD

## 2023-08-10 NOTE — Progress Notes (Signed)
Occupational Therapy Treatment Patient Details Name: [name] MRN: [id number] DOB: 16-Jun-1941 Today's Date: 08/10/2023   History of present illness [name] is a 82 y.o. male who presented after a fall with general weakness and fever with concern for sepsis. Workup pending. PMHx: hypertension, type 2 diabetes, CLL under surveillance, Parkinson's, sleep apnea and prostate cancer   OT comments  Pt. Seen for skilled OT treatment.  Wife present and very helpful and active in pts. Care.  Bed mobility with S.  Lb dressing for seated portion with cga.  Attempted in room ambulation to/from b.room with and without AD.  Pt. And spouse agree safer with rw.  Describe a rollator at home that he can use.  Education, demo and return demo from wife for how to safely assist with sit/stands and use of gait belt to assist pt. At home.        If plan is discharge home, recommend the following:  A little help with walking and/or transfers;A little help with bathing/dressing/bathroom;Assistance with cooking/housework;Direct supervision/assist for medications management;Direct supervision/assist for financial management;Help with stairs or ramp for entrance;Assist for transportation   Equipment Recommendations  None recommended by OT    Recommendations for Other Services      Precautions / Restrictions Precautions Precautions: Fall Restrictions Weight Bearing Restrictions: No       Mobility Bed Mobility Overal bed mobility: Needs Assistance Bed Mobility: Supine to Sit, Sit to Supine     Supine to sit: Contact guard, HOB elevated Sit to supine: Supervision   General bed mobility comments: pts. wife reports bed rails attached to reg. bed at home.  also reports high bed. adj. bed height for pt. to demo safe return to bed. able to clear home height without physical assistance    Transfers Overall transfer level: Needs assistance Equipment used: None, Rolling walker (2 wheels) Transfers:  Sit to/from Stand, Bed to chair/wheelchair/BSC Sit to Stand: Min assist     Step pivot transfers: Min assist     General transfer comment: min A to steady once standing, utilized no ad then added rw with better results.  had wife assist with sit/stand and stand/sit and proper use of gait belt and positioning to use while walking with pt. to ensure safe caregiver support at home     Balance                                           ADL either performed or assessed with clinical judgement   ADL Overall ADL's : Needs assistance/impaired                     Lower Body Dressing: Contact guard assist;Sitting/lateral leans Lower Body Dressing Details (indicate cue type and reason): able to pull each leg up towards chest to reach each foot for adj. of B socks, eduated pt. and spouse on fall prevention by not bending forward to reach bles Toilet Transfer: Minimal assistance;Ambulation;Rolling walker (2 wheels);Regular Toilet;Grab bars Toilet Transfer Details (indicate cue type and reason): ambulated to/from b.room without AD then to/rom b.room with RW, pt. and spouse agreed rw safer during ambulation. report having a rollator for use at home-reviewed benefits of urinal use at night from bed and also disposable briefs for added support/comfort Toileting- Clothing Manipulation and Hygiene: Contact guard assist;Sit to/from stand Toileting - Clothing Manipulation Details (indicate cue type and  reason): pt. had began to stand and manage pulling up underwear without notifiying therapist asst. Tub/ Shower Transfer: Walk-in shower;Minimal assistance;Cueing for sequencing;Ambulation;Grab bars Tub/Shower Transfer Details (indicate cue type and reason): wife reports there is a seat for him to use Functional mobility during ADLs: Minimal assistance General ADL Comments: reports having a rollator at home that he will use, wife also states they have a gaitbelt she will use.  reviewed  need for consistent use of cpap. pts. wife asking me to explain to pt. why it is so important. provided explanation per her request reviewd what OSA was and how CPAP helps.    Extremity/Trunk Assessment              Vision       Perception     Praxis      Cognition Arousal: Alert Behavior During Therapy: Flat affect, WFL for tasks assessed/performed Overall Cognitive Status: Within Functional Limits for tasks assessed                                 General Comments: overall WFL, limited insight to safety and deficits        Exercises      Shoulder Instructions       General Comments      Pertinent Vitals/ Pain       Pain Assessment Pain Assessment: No/denies pain  Home Living                                          Prior Functioning/Environment              Frequency  Min 1X/week        Progress Toward Goals  OT Goals(current goals can now be found in the care plan section)  Progress towards OT goals: Progressing toward goals     Plan      Co-evaluation                 AM-PAC OT "6 Clicks" Daily Activity     Outcome Measure   Help from another person eating meals?: A Little Help from another person taking care of personal grooming?: A Little Help from another person toileting, which includes using toliet, bedpan, or urinal?: A Little Help from another person bathing (including washing, rinsing, drying)?: A Lot Help from another person to put on and taking off regular upper body clothing?: A Little Help from another person to put on and taking off regular lower body clothing?: A Lot 6 Click Score: 16    End of Session Equipment Utilized During Treatment: Gait belt;Rolling walker (2 wheels)  OT Visit Diagnosis: Unsteadiness on feet (R26.81);Other abnormalities of gait and mobility (R26.89);Muscle weakness (generalized) (M62.81);History of falling (Z91.81)   Activity Tolerance Patient tolerated  treatment well   Patient Left in bed;with call bell/phone within reach;with bed alarm set;with family/visitor present   Nurse Communication          Time: 1610-9604 OT Time Calculation (min): 28 min  Charges: OT General Charges $OT Visit: 1 Visit OT Treatments $Self Care/Home Management : 23-37 mins  Boneta Lucks, COTA/L Acute Rehabilitation [phone]   Alessandra Bevels Lorraine-COTA/L 08/10/2023, 12:05 PM

## 2023-08-11 ENCOUNTER — Encounter: Payer: Self-pay | Admitting: Family Medicine

## 2023-08-11 DIAGNOSIS — M533 Sacrococcygeal disorders, not elsewhere classified: Secondary | ICD-10-CM | POA: Diagnosis not present

## 2023-08-11 DIAGNOSIS — Z5181 Encounter for therapeutic drug level monitoring: Secondary | ICD-10-CM | POA: Diagnosis not present

## 2023-08-11 DIAGNOSIS — M25551 Pain in right hip: Secondary | ICD-10-CM | POA: Diagnosis not present

## 2023-08-11 DIAGNOSIS — Z79899 Other long term (current) drug therapy: Secondary | ICD-10-CM | POA: Diagnosis not present

## 2023-08-11 DIAGNOSIS — G8929 Other chronic pain: Secondary | ICD-10-CM | POA: Diagnosis not present

## 2023-08-11 DIAGNOSIS — M47816 Spondylosis without myelopathy or radiculopathy, lumbar region: Secondary | ICD-10-CM | POA: Diagnosis not present

## 2023-08-12 ENCOUNTER — Telehealth: Payer: Self-pay

## 2023-08-12 NOTE — Transitions of Care (Post Inpatient/ED Visit) (Signed)
   08/12/2023  Name: [name] MRN: [id number] DOB: 1940-12-06  Today's TOC FU Call Status: Today's TOC FU Call Status:: Unsuccessful Call (1st Attempt) Unsuccessful Call (1st Attempt) Date: 08/12/23  Attempted to reach the patient regarding the most recent Inpatient/ED visit.  Follow Up Plan: Additional outreach attempts will be made to reach the patient to complete the Transitions of Care (Post Inpatient/ED visit) call.     Antionette Fairy, RN,BSN,CCM [facility] Care Management Care Management Community Coordinator Direct Phone: [phone] Toll Free: [phone] Fax: [phone]

## 2023-08-12 NOTE — Transitions of Care (Post Inpatient/ED Visit) (Signed)
08/12/2023  Name: [name] MRN: [id number] DOB: 11/08/1941  Today's TOC FU Call Status: Today's TOC FU Call Status:: Successful TOC FU Call Completed TOC FU Call Complete Date: 08/12/23 Patient's Name and Date of Birth confirmed.  Transition Care Management Follow-up Telephone Call Date of Discharge: 08/10/23 Discharge Facility: [facility]) Type of Discharge: Inpatient Admission Primary Inpatient Discharge Diagnosis:: "febrile illness" How have you been since you were released from the hospital?: Better (Wife states overall pt doing better but remains weak-she is anxious for him to start therapy. He has been up using rollator.Appetite fair-drinking Ensure daily. CBG 157. LBM today.) Any questions or concerns?: No  Items Reviewed: Did you receive and understand the discharge instructions provided?: Yes Medications obtained,verified, and reconciled?: Yes (Medications Reviewed) Any new allergies since your discharge?: No Dietary orders reviewed?: Yes Type of Diet Ordered:: low salt/heart healthy/carb modified Do you have support at home?: Yes People in Home: spouse Name of Support/Comfort Primary Source: Evon  Medications Reviewed Today: Medications Reviewed Today     Reviewed by Charlyn Minerva, RN (Registered Nurse) on 08/12/23 at 1346  Med List Status: <None>   Medication Order Taking? Sig Documenting Provider Last Dose Status Informant  amLODipine (NORVASC) 10 MG tablet [id number] Yes Take 10 mg by mouth daily. [provider] Taking Active Spouse/Significant Other, Pharmacy Records  busPIRone (BUSPAR) 10 MG tablet [id number] Yes Take by mouth 3 (three) times daily. [provider] Taking Active Spouse/Significant Other, Pharmacy Records  Cholecalciferol (VITAMIN D3) 50 MCG (2000 UT) TABS [id number] Yes Take 1 tablet by mouth daily. [provider] Taking Active Spouse/Significant Other, Pharmacy Records  empagliflozin (JARDIANCE)  25 MG TABS tablet [id number] No Take 1 tablet (25 mg total) by mouth daily before breakfast. (GIVEN BY [facility])  Patient not taking: Reported on 08/12/2023   Shelva Majestic, MD Not Taking Active Spouse/Significant Other, Pharmacy Records  fenofibrate (TRICOR) 145 MG tablet [id number] No Take 160 mg by mouth daily.  Patient not taking: Reported on 08/12/2023   [provider] Not Taking Active Spouse/Significant Other, Pharmacy Records           Med Note Lia Hopping, Marylen Ponto   Fri Aug 08, 2023 11:42 PM) Pts spouse states pt does not use this often  gabapentin (NEURONTIN) 300 MG capsule [id number] Yes Take 600 mg by mouth 3 (three) times daily. [provider] Taking Active Spouse/Significant Other, Pharmacy Records           Med Note Lia Hopping, Marylen Ponto   Fri Aug 08, 2023 11:41 PM)    glucose blood test strip [id number] Yes Use to test 4 times daily. E11.9 Shelva Majestic, MD Taking Active Spouse/Significant Other, Pharmacy Records  hydrochlorothiazide (HYDRODIURIL) 25 MG tablet [id number] Yes Take 25 mg by mouth daily. [provider] Taking Active Spouse/Significant Other, Pharmacy Records  insulin aspart (NOVOLOG) 100 UNIT/ML injection [id number] Yes Inject 35 Units into the skin 3 (three) times daily with meals. When sugar is elevated [provider] Taking Active Spouse/Significant Other, Pharmacy Records           Med Note (DAVIS, SOPHIA A   Fri Dec 27, 2016  9:53 AM)    insulin glargine (LANTUS) 100 UNIT/ML injection [id number] Yes Inject 50 Units into the skin at bedtime. [provider] Taking Active Spouse/Significant Other, Pharmacy Records  levothyroxine (SYNTHROID, LEVOTHROID) 50 MCG tablet [id number] Yes Take 50 mcg by mouth daily before breakfast. [provider] Taking Active Spouse/Significant Other, Pharmacy  Records  losartan (COZAAR) 100 MG tablet [id number] Yes Take 100 mg by mouth daily. [provider] Taking Active  Spouse/Significant Other, Pharmacy Records  mirtazapine (REMERON) 15 MG tablet [id number] Yes Take 15 mg by mouth at bedtime. [provider] Taking Active Spouse/Significant Other, Pharmacy Records  neomycin-polymyxin b-dexamethasone (MAXITROL) 3.5-10000-0.1 SUSP [id number] Yes Place 1 drop into both eyes 3 (three) times daily. [provider] Taking Active Spouse/Significant Other, Pharmacy Records  OVER THE COUNTER MEDICATION [id number] Yes PreserVision for eyes [provider] Taking Active Spouse/Significant Other, Pharmacy Records  potassium chloride SA (K-DUR,KLOR-CON) 20 MEQ tablet [id number] Yes Take 20 mEq by mouth daily. [provider] Taking Active Spouse/Significant Other, Pharmacy Records  simvastatin (ZOCOR) 80 MG tablet [id number] Yes Take 40 mg by mouth daily at 6 PM. Take half tablet (40 mg) by mouth once daily [provider] Taking Active Spouse/Significant Other, Pharmacy Records  sucralfate (CARAFATE) 1 G tablet [id number] Yes Take 1 g by mouth 4 (four) times daily -  with meals and at bedtime. [provider] Taking Active Spouse/Significant Other, Pharmacy Records  terbinafine (LAMISIL) 1 % cream [id number] Yes Apply 1 Application topically 2 (two) times daily. [provider] Taking Active Spouse/Significant Other, Pharmacy Records  traMADol (ULTRAM) 50 MG tablet [id number] Yes Take 50 mg by mouth every 6 (six) hours as needed. [provider] Taking Active Spouse/Significant Other, Pharmacy Records  zolpidem (AMBIEN) 5 MG tablet [id number] Yes Take 5 mg by mouth at bedtime as needed for sleep. [provider] Taking Active Spouse/Significant Other, Pharmacy Records            Home Care and Equipment/Supplies: Were Home Health Services Ordered?: Yes Name of Home Health Agency:: [facility] Has Agency set up a time to come to your home?: No (RN CM contacted [facility]-spoke w/ Jessica-advised thatthey have  referral-but it is curently 'held in [employer] approval and then they will be able to make visit-wife made aware-she will call insurance to follow up) Any new equipment or medical supplies ordered?: NA  Functional Questionnaire: Do you need assistance with bathing/showering or dressing?: Yes (wife assisting with ADLs/IADLs) Do you need assistance with meal preparation?: Yes Do you need assistance with eating?: No Do you have difficulty maintaining continence: No Do you need assistance with getting out of bed/getting out of a chair/moving?: No Do you have difficulty managing or taking your medications?: Yes  Follow up appointments reviewed: PCP Follow-up appointment confirmed?: Yes Date of PCP follow-up appointment?: 08/19/23 Follow-up Provider: Dr. Durene Cal Specialist [facility] Follow-up appointment confirmed?: NA Do you need transportation to your follow-up appointment?: No (wife confirms she is able to get pt to appts) Do you understand care options if your condition(s) worsen?: Yes-patient verbalized understanding   TOC Interventions Today    Flowsheet Row Most Recent Value  TOC Interventions   TOC Interventions Discussed/Reviewed TOC Interventions Discussed      Interventions Today    Flowsheet Row Most Recent Value  Chronic Disease   Chronic disease during today's visit Diabetes  General Interventions   General Interventions Discussed/Reviewed General Interventions Discussed, Doctor Visits, Durable Medical Equipment (DME)  Doctor Visits Discussed/Reviewed Doctor Visits Discussed, PCP, Specialist  Durable Medical Equipment (DME) Glucomoter  PCP/Specialist Visits Compliance with follow-up visit  Education Interventions   Education Provided Provided Education  Provided Verbal Education On Foot Care, Blood Sugar Monitoring, When to see the doctor, Nutrition  Nutrition Interventions   Nutrition Discussed/Reviewed Nutrition Discussed, Increasing proteins,  Decreasing fats, Decreasing  salt, Adding fruits and vegetables, Decreasing sugar intake, Fluid intake, Supplemental nutrition  Pharmacy Interventions   Pharmacy Dicussed/Reviewed Pharmacy Topics Discussed, Medications and their functions  Safety Interventions   Safety Discussed/Reviewed Safety Discussed, Fall Risk, Home Safety  Home Safety Assistive Devices, Contact home health agency  [pt has rollator in the home and using it, contacted [facility]-spoke w/ Shanda Bumps to follow up on when staff would be visiting pt]        Antionette Fairy, Cathrine Muster [facility] Care Management Care Management Community Coordinator Direct Phone: [phone] Toll Free: [phone] Fax: [phone]

## 2023-08-13 LAB — CULTURE, BLOOD (ROUTINE X 2)
Culture: NO GROWTH
Culture: NO GROWTH

## 2023-08-14 ENCOUNTER — Ambulatory Visit (INDEPENDENT_AMBULATORY_CARE_PROVIDER_SITE_OTHER): Payer: Medicare Other

## 2023-08-14 ENCOUNTER — Telehealth: Payer: Self-pay

## 2023-08-14 DIAGNOSIS — Z794 Long term (current) use of insulin: Secondary | ICD-10-CM | POA: Diagnosis not present

## 2023-08-14 DIAGNOSIS — F329 Major depressive disorder, single episode, unspecified: Secondary | ICD-10-CM | POA: Diagnosis not present

## 2023-08-14 DIAGNOSIS — E538 Deficiency of other specified B group vitamins: Secondary | ICD-10-CM

## 2023-08-14 DIAGNOSIS — Z8546 Personal history of malignant neoplasm of prostate: Secondary | ICD-10-CM | POA: Diagnosis not present

## 2023-08-14 DIAGNOSIS — G20A1 Parkinson's disease without dyskinesia, without mention of fluctuations: Secondary | ICD-10-CM | POA: Diagnosis not present

## 2023-08-14 DIAGNOSIS — Z86010 Personal history of colonic polyps: Secondary | ICD-10-CM | POA: Diagnosis not present

## 2023-08-14 DIAGNOSIS — E11319 Type 2 diabetes mellitus with unspecified diabetic retinopathy without macular edema: Secondary | ICD-10-CM | POA: Diagnosis not present

## 2023-08-14 DIAGNOSIS — E1169 Type 2 diabetes mellitus with other specified complication: Secondary | ICD-10-CM | POA: Diagnosis not present

## 2023-08-14 DIAGNOSIS — F0670 Mild neurocognitive disorder due to known physiological condition without behavioral disturbance: Secondary | ICD-10-CM | POA: Diagnosis not present

## 2023-08-14 DIAGNOSIS — E039 Hypothyroidism, unspecified: Secondary | ICD-10-CM | POA: Diagnosis not present

## 2023-08-14 DIAGNOSIS — N182 Chronic kidney disease, stage 2 (mild): Secondary | ICD-10-CM | POA: Diagnosis not present

## 2023-08-14 DIAGNOSIS — F4310 Post-traumatic stress disorder, unspecified: Secondary | ICD-10-CM | POA: Diagnosis not present

## 2023-08-14 DIAGNOSIS — I499 Cardiac arrhythmia, unspecified: Secondary | ICD-10-CM | POA: Diagnosis not present

## 2023-08-14 DIAGNOSIS — E785 Hyperlipidemia, unspecified: Secondary | ICD-10-CM | POA: Diagnosis not present

## 2023-08-14 DIAGNOSIS — Z7984 Long term (current) use of oral hypoglycemic drugs: Secondary | ICD-10-CM | POA: Diagnosis not present

## 2023-08-14 DIAGNOSIS — F419 Anxiety disorder, unspecified: Secondary | ICD-10-CM | POA: Diagnosis not present

## 2023-08-14 DIAGNOSIS — C9110 Chronic lymphocytic leukemia of B-cell type not having achieved remission: Secondary | ICD-10-CM | POA: Diagnosis not present

## 2023-08-14 DIAGNOSIS — E1122 Type 2 diabetes mellitus with diabetic chronic kidney disease: Secondary | ICD-10-CM | POA: Diagnosis not present

## 2023-08-14 DIAGNOSIS — I890 Lymphedema, not elsewhere classified: Secondary | ICD-10-CM | POA: Diagnosis not present

## 2023-08-14 DIAGNOSIS — I129 Hypertensive chronic kidney disease with stage 1 through stage 4 chronic kidney disease, or unspecified chronic kidney disease: Secondary | ICD-10-CM | POA: Diagnosis not present

## 2023-08-14 MED ORDER — CYANOCOBALAMIN 1000 MCG/ML IJ SOLN
1000.0000 ug | Freq: Once | INTRAMUSCULAR | Status: AC
Start: 2023-08-14 — End: 2023-08-14
  Administered 2023-08-14: 1000 ug via INTRAMUSCULAR

## 2023-08-14 NOTE — Telephone Encounter (Signed)
Spoke to patient's wife advised Dr.Schumann wants to see patient 9/17 at 8:40 am.Stated he already has appointment that day at 8:00 am with PCP.Advised I will ask Dr.Schumann where to add to his schedule.

## 2023-08-14 NOTE — Progress Notes (Signed)
Pt tolerated b12 injection well. 

## 2023-08-18 ENCOUNTER — Other Ambulatory Visit: Payer: Self-pay

## 2023-08-18 ENCOUNTER — Telehealth: Payer: Self-pay

## 2023-08-18 ENCOUNTER — Ambulatory Visit: Payer: Medicare Other | Attending: Cardiology

## 2023-08-18 DIAGNOSIS — I4891 Unspecified atrial fibrillation: Secondary | ICD-10-CM

## 2023-08-18 DIAGNOSIS — E1122 Type 2 diabetes mellitus with diabetic chronic kidney disease: Secondary | ICD-10-CM | POA: Diagnosis not present

## 2023-08-18 DIAGNOSIS — I499 Cardiac arrhythmia, unspecified: Secondary | ICD-10-CM | POA: Diagnosis not present

## 2023-08-18 DIAGNOSIS — I447 Left bundle-branch block, unspecified: Secondary | ICD-10-CM

## 2023-08-18 DIAGNOSIS — E11319 Type 2 diabetes mellitus with unspecified diabetic retinopathy without macular edema: Secondary | ICD-10-CM | POA: Diagnosis not present

## 2023-08-18 DIAGNOSIS — C9110 Chronic lymphocytic leukemia of B-cell type not having achieved remission: Secondary | ICD-10-CM | POA: Diagnosis not present

## 2023-08-18 DIAGNOSIS — I129 Hypertensive chronic kidney disease with stage 1 through stage 4 chronic kidney disease, or unspecified chronic kidney disease: Secondary | ICD-10-CM | POA: Diagnosis not present

## 2023-08-18 DIAGNOSIS — N182 Chronic kidney disease, stage 2 (mild): Secondary | ICD-10-CM | POA: Diagnosis not present

## 2023-08-18 NOTE — Telephone Encounter (Signed)
Spoke with patient's wife advised Dr.Schumann would like to see patient 9/17 8:40 am at our [place] office.Wife stated husband already has appointment to see PCP.Spoke to Dr.Schumann he advised 14 day Zio monitor and see pt in 1 to 2 months. Appointment scheduled with Dr.Schumann 11/12 at 1:40 pm at [facility] office.

## 2023-08-18 NOTE — Progress Notes (Unsigned)
Enrolled for [employer] to mail a ZIO XT long term holter monitor to the patients address on file. Requested delivery date of 08/25/23.

## 2023-08-18 NOTE — Telephone Encounter (Signed)
Already spoke to patient's wife.See previous 9/16 telephone note.

## 2023-08-18 NOTE — Telephone Encounter (Signed)
Recommend Zio x 2 weeks to evaluate for Afib and f/u with me or APP in 2 months

## 2023-08-19 ENCOUNTER — Ambulatory Visit: Payer: Medicare Other | Admitting: Family Medicine

## 2023-08-19 ENCOUNTER — Telehealth: Payer: Self-pay

## 2023-08-19 ENCOUNTER — Encounter: Payer: Self-pay | Admitting: Family Medicine

## 2023-08-19 VITALS — BP 122/70 | HR 61 | Temp 97.2°F | Ht 73.0 in | Wt 260.6 lb

## 2023-08-19 DIAGNOSIS — R531 Weakness: Secondary | ICD-10-CM

## 2023-08-19 DIAGNOSIS — E1169 Type 2 diabetes mellitus with other specified complication: Secondary | ICD-10-CM | POA: Diagnosis not present

## 2023-08-19 DIAGNOSIS — Z7984 Long term (current) use of oral hypoglycemic drugs: Secondary | ICD-10-CM | POA: Diagnosis not present

## 2023-08-19 DIAGNOSIS — E039 Hypothyroidism, unspecified: Secondary | ICD-10-CM

## 2023-08-19 DIAGNOSIS — Z794 Long term (current) use of insulin: Secondary | ICD-10-CM | POA: Diagnosis not present

## 2023-08-19 DIAGNOSIS — E785 Hyperlipidemia, unspecified: Secondary | ICD-10-CM

## 2023-08-19 DIAGNOSIS — C9110 Chronic lymphocytic leukemia of B-cell type not having achieved remission: Secondary | ICD-10-CM | POA: Diagnosis not present

## 2023-08-19 DIAGNOSIS — I10 Essential (primary) hypertension: Secondary | ICD-10-CM | POA: Diagnosis not present

## 2023-08-19 DIAGNOSIS — N182 Chronic kidney disease, stage 2 (mild): Secondary | ICD-10-CM | POA: Diagnosis not present

## 2023-08-19 DIAGNOSIS — E1122 Type 2 diabetes mellitus with diabetic chronic kidney disease: Secondary | ICD-10-CM | POA: Diagnosis not present

## 2023-08-19 DIAGNOSIS — R6510 Systemic inflammatory response syndrome (SIRS) of non-infectious origin without acute organ dysfunction: Secondary | ICD-10-CM | POA: Diagnosis not present

## 2023-08-19 DIAGNOSIS — E11319 Type 2 diabetes mellitus with unspecified diabetic retinopathy without macular edema: Secondary | ICD-10-CM

## 2023-08-19 DIAGNOSIS — I499 Cardiac arrhythmia, unspecified: Secondary | ICD-10-CM | POA: Diagnosis not present

## 2023-08-19 DIAGNOSIS — I129 Hypertensive chronic kidney disease with stage 1 through stage 4 chronic kidney disease, or unspecified chronic kidney disease: Secondary | ICD-10-CM | POA: Diagnosis not present

## 2023-08-19 LAB — CBC WITH DIFFERENTIAL/PLATELET
Basophils Absolute: 0.1 10*3/uL (ref 0.0–0.1)
Basophils Relative: 0.3 % (ref 0.0–3.0)
Eosinophils Absolute: 0.2 10*3/uL (ref 0.0–0.7)
Eosinophils Relative: 0.5 % (ref 0.0–5.0)
HCT: 42.9 % (ref 39.0–52.0)
Hemoglobin: 13.4 g/dL (ref 13.0–17.0)
Lymphocytes Relative: 80.7 % — ABNORMAL HIGH (ref 12.0–46.0)
Lymphs Abs: 34.2 10*3/uL — ABNORMAL HIGH (ref 0.7–4.0)
MCHC: 31.2 g/dL (ref 30.0–36.0)
MCV: 87.1 fl (ref 78.0–100.0)
Monocytes Absolute: 0.7 10*3/uL (ref 0.1–1.0)
Monocytes Relative: 1.6 % — ABNORMAL LOW (ref 3.0–12.0)
Neutro Abs: 7.2 10*3/uL (ref 1.4–7.7)
Neutrophils Relative %: 16.9 % — ABNORMAL LOW (ref 43.0–77.0)
Platelets: 217 10*3/uL (ref 150.0–400.0)
RBC: 4.92 Mil/uL (ref 4.22–5.81)
RDW: 15.7 % — ABNORMAL HIGH (ref 11.5–15.5)
WBC: 42.4 10*3/uL (ref 4.0–10.5)

## 2023-08-19 LAB — COMPREHENSIVE METABOLIC PANEL
ALT: 23 U/L (ref 0–53)
AST: 18 U/L (ref 0–37)
Albumin: 4 g/dL (ref 3.5–5.2)
Alkaline Phosphatase: 52 U/L (ref 39–117)
BUN: 15 mg/dL (ref 6–23)
CO2: 29 meq/L (ref 19–32)
Calcium: 8.9 mg/dL (ref 8.4–10.5)
Chloride: 103 meq/L (ref 96–112)
Creatinine, Ser: 1.04 mg/dL (ref 0.40–1.50)
GFR: 68.05 mL/min (ref >60.00)
Glucose, Bld: 143 mg/dL — ABNORMAL HIGH (ref 70–99)
Potassium: 4 meq/L (ref 3.5–5.1)
Sodium: 140 meq/L (ref 135–145)
Total Bilirubin: 1.3 mg/dL — ABNORMAL HIGH (ref 0.2–1.2)
Total Protein: 6.1 g/dL (ref 6.0–8.3)

## 2023-08-19 LAB — HEMOGLOBIN A1C: Hgb A1c MFr Bld: 7.1 % — ABNORMAL HIGH (ref 4.6–6.5)

## 2023-08-19 LAB — TSH: TSH: 1.9 u[IU]/mL (ref 0.35–5.50)

## 2023-08-19 NOTE — Progress Notes (Signed)
Phone [phone]   Subjective:  [name] is a 81 y.o. year old very pleasant male patient who presents for transitional care management and hospital follow up for SIRS-possible response to COVID-19 vaccination. Patient was hospitalized from 08/08/23 to 08/10/23. A TCM phone call was completed on 08/12/23. Medical complexity moderate   Patient presented to the hospital generalized weakness, low-grade fever  (per wife 100.5) after receiving COVID-19 vaccine 1 day prior to admission- wife reports he had decreased by mouth intake and dehdration. Reportedly he had slid down in his chair onto the floor and couldn't get up- wife couldn't get him up. He felt extremely weak and wife called EMS and they found him to be in possible atrial fibrillation with tachycardia however he initially declined to be transported to the ER with EMS as he prefers not to be in the hospital.  He continued to progressively decline and wife brought him to the ER in private vehicle-in the ER he had a low-grade fever and was mildly tachypneic-EKG at that point showed possible atrial flutter with left bundle branch block-cardiology was consulted and noted sinus with PVCs and there was no evidence of atrial fibrillation or atrial flutter throughout most of hospitalization-it was thought that his baseline tremors could have contributed to initial read of atrial fibrillation.  .  Chest x-ray with no acute cardiopulmonary disease.  COVID-19 and influenza swabs were negative.  White count elevation of 46,000 up from baseline around 38,000.  Blood cultures were obtained and he was started on antibiotics.  Although there was initially concern for infection that was later ruled out.  Urine culture was negative.  No obvious infection was found and cultures were negative. Ultimate diagnosis was SIRS with no clear source of bacterial infection-possible viral infection.  Due to debility from this he was to be discharged with home health PT and OT  which we are going to supervise  As far as rhythm concern cardiology is setting him up with a ZIO 14-day monitor to further evaluate   He did have slight AKI but improved with hydration-was started back on losartan hydrochlorothiazide at discharge  Chronic medical conditions including: Type 2 diabetes insulin-dependent-well-controlled on insulin and this was continued Hypertension controlled with amlodipine while hospitalized-losartan hydrochlorothiazide initially held but later restarted before discharge Sleep apnea was maintained on CPAP CLL with stable though white count initially elevated Parkinson's disease is followed by neurology outpatient  Today he reports significant improvement in overall health since getting home- strength is improving. Physical therapy is coming out to the home through [facility]- I will be signing off on orders. Also has nursing has been checking on him and he has an aide planned at the moment for up to 4-5 weeks. Reports 3 physical therapy interventions this year. 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    Priority: High   History of prostate cancer 01/24/2014    Priority: High   Type 2 diabetes mellitus 09/24/2007  Priority: High   Diabetic retinopathy 09/24/2007    Priority: High   PTSD (post-traumatic stress disorder) 07/30/2022    Priority: Medium    Macular degeneration     Priority: Medium    Hypothyroidism 06/22/2021    Priority: Medium     Vitamin D deficiency 11/01/2020    Priority: Medium    Tremor 08/11/2015    Priority: Medium    Lapband APL + HH repair 08/12/2013    Priority: Medium    Insomnia 09/21/2009    Priority: Medium    Hyperlipidemia associated with type 2 diabetes mellitus 09/24/2007    Priority: Medium    OSA on CPAP 09/24/2007    Priority: Medium    Essential hypertension 09/24/2007    Priority: Medium    Bell's palsy 05/08/2020    Priority: Low   GERD (gastroesophageal reflux disease) 12/08/2014    Priority: Low   History of colonic polyps 08/22/2008    Priority: Low   Chronic right hip pain 09/27/2019    Priority: 1.   Irregular cardiac rhythm 08/09/2023   SIRS (systemic inflammatory response syndrome) (HCC) 08/08/2023   AKI (acute kidney injury) (HCC) 08/08/2023   LBBB (left bundle branch block) 08/08/2023   Major depressive disorder 07/30/2022   Gynecomastia 12/28/2020   Sacroiliac joint dysfunction 06/29/2018   Temporomandibular joint (TMJ) pain 04/14/2017   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 myelopathy or radiculopathy 02/01/2016    Medications- reviewed and updated  A medical reconciliation was performed comparing current medicines to hospital discharge medications. Current Outpatient Medications  Medication Sig Dispense Refill   amLODipine (NORVASC) 10 MG tablet Take 10 mg by mouth daily.     busPIRone (BUSPAR) 10 MG tablet Take by mouth 3 (three) times daily.     Cholecalciferol (VITAMIN D3) 50 MCG (2000 UT) TABS Take 1 tablet by mouth daily.     empagliflozin (JARDIANCE) 25 MG TABS tablet Take 1 tablet (25 mg total) by mouth daily before breakfast. (GIVEN BY [facility]) 30 tablet    fenofibrate (TRICOR) 145 MG tablet Take 160 mg by mouth daily.     gabapentin (NEURONTIN) 300 MG capsule Take 600 mg by mouth 3 (three) times daily.     glucose blood test strip Use to test 4 times daily. E11.9 100 each 12   hydrochlorothiazide (HYDRODIURIL) 25 MG tablet Take 25 mg by mouth daily.      insulin aspart (NOVOLOG) 100 UNIT/ML injection Inject 35 Units into the skin 3 (three) times daily with meals. When sugar is elevated     insulin glargine (LANTUS) 100 UNIT/ML injection Inject 50 Units into the skin at bedtime.     levothyroxine (SYNTHROID, LEVOTHROID) 50 MCG tablet Take 50 mcg by mouth daily before breakfast.     losartan (COZAAR) 100 MG tablet Take 100 mg by mouth daily.     mirtazapine (REMERON) 15 MG tablet Take 15 mg by mouth at bedtime.     neomycin-polymyxin b-dexamethasone (MAXITROL) 3.5-10000-0.1 SUSP Place 1 drop into both eyes 3 (three) times daily.     OVER THE COUNTER MEDICATION PreserVision for eyes     potassium chloride SA (K-DUR,KLOR-CON) 20 MEQ tablet Take 20 mEq by mouth daily.     simvastatin (ZOCOR) 80 MG tablet Take 40 mg by mouth daily at 6 PM. Take half tablet (40 mg) by mouth once daily     sucralfate (CARAFATE) 1 G tablet Take 1 g by mouth 4 (four) times daily -  with meals and at bedtime.  terbinafine (LAMISIL) 1 % cream Apply 1 Application topically 2 (two) times daily.     traMADol (ULTRAM) 50 MG tablet Take 50 mg by mouth every 6 (six) hours as needed.     zolpidem (AMBIEN) 5 MG tablet Take 5 mg by mouth at bedtime as needed for sleep.     No current facility-administered medications for this visit.   Objective  Objective:  BP 122/70   Pulse 61   Temp (!) 97.2 F (36.2 C)   Ht 6\' 1"  (1.854 m)   Wt 260 lb 9.6 oz (118.2 kg)   SpO2 98%   BMI 34.38 kg/m  Gen: NAD, resting comfortably CV: RRR no murmurs rubs or gallops Lungs: CTAB no crackles, wheeze, rhonchi Abdomen: soft/nontender/nondistended/normal bowel sounds. No rebound or guarding.  Ext: 1+ edema Skin: warm, dry Neuro: baseline tremors, masked facies per baseline   Assessment and Plan:   Assessment & Plan SIRS (systemic inflammatory response syndrome) (HCC) Appears to have largely recovered from this but we discussed with his Parkinson's anytime he has illness can be set back  and he may need physical therapy such as this time- we are supporting by signing off on home health orders Generalized weakness See SYSTEMIC IMMUNE RESPONSE SYNDROME assessment as above. Using walker at home right now- I agree with that- wife supporting him in office. With recent decline in health I advised against international travel Type 2 diabetes mellitus with retinopathy of both eyes, with long-term current use of insulin, macular edema presence unspecified, unspecified retinopathy severity (HCC) Hopefully stable/controlled- update a1c today. Continue current meds for now - jardiance 25 mg along with lantus 50 units, novolog 25 units usually 2-3 x a day. Insulin in range on dexom 80% of time, high 18% in last 30 days. Generally doing very well Lab Results  Component Value Date   HGBA1C 6.9 03/26/2023   Chronic lymphocytic leukemia (HCC) White blood cell(s) elevated in hospital but trended back down- update today.   Essential hypertension Blood pressure is well controlled on amlodipine 10 mg. Had hyctz 25 mg and losartan 100 mg held in hospital but back on these. Doing well- continue current medications  Hyperlipidemia associated with type 2 diabetes mellitus Simvastatin 80 mg has controlled LDL- continue current medications . Also on fenofibrate for triglyceride(s) - only mild elevations Lab Results  Component Value Date   CHOL 124 03/26/2023   HDL 48 03/26/2023   LDLCALC 32 03/26/2023   LDLDIRECT 55.0 04/22/2022   TRIG 221 (A) 03/26/2023   CHOLHDL 3 04/22/2022   Hypothyroidism, unspecified type Hopefully stable- update levothyroxine 50 mcg today. Continue current meds for now  Lab Results  Component Value Date   TSH 1.34 08/22/2022    #anxiety/sleep/depression- in general doing well on mirtazepine 15 mg, buspirone 10 mg three times daily and Ambien- through the [place]   Recommended follow up: Return for next already scheduled visit or sooner if needed. Future Appointments  Date  Time Provider Department Center  09/05/2023  8:45 AM Tat, Octaviano Batty, DO [facility] None  09/11/2023  9:15 AM [facility] NURSE [facility]  09/18/2023 12:45 PM [facility] None  09/18/2023  1:20 PM Malachy Mood, MD [facility] None  09/25/2023  7:30 AM Sherrie George, MD [facility] None  10/14/2023  1:40 PM Little Ishikawa, MD [facility] None  10/21/2023  8:20 AM Shelva Majestic, MD [facility]  05/24/2024 11:15 AM [facility] ANNUAL WELLNESS VISIT 1 [facility]    Lab/Order associations:  ICD-10-CM   1. SIRS (systemic inflammatory response syndrome) (HCC)  R65.10     2. Generalized weakness  R53.1     3. Type 2 diabetes mellitus with retinopathy of both eyes, with long-term current use of insulin, macular edema presence unspecified, unspecified retinopathy severity (HCC)  E11.319 Urine Microalbumin w/creat. ratio   Z79.4 Comprehensive metabolic panel    CBC with Differential/Platelet    HgB A1c    4. Chronic lymphocytic leukemia (HCC)  C91.10     5. Essential hypertension  I10     6. Hyperlipidemia associated with type 2 diabetes mellitus  E11.69    E78.5     7. Hypothyroidism, unspecified type  E03.9 TSH      No orders of the defined types were placed in this encounter.   Return precautions advised.  Tana Conch, MD

## 2023-08-19 NOTE — Assessment & Plan Note (Signed)
Blood pressure is well controlled on amlodipine 10 mg. Had hyctz 25 mg and losartan 100 mg held in hospital but back on these. Doing well- continue current medications

## 2023-08-19 NOTE — Assessment & Plan Note (Signed)
Appears to have largely recovered from this but we discussed with his Parkinson's anytime he has illness can be set back and he may need physical therapy such as this time- we are supporting by signing off on home health orders

## 2023-08-19 NOTE — Telephone Encounter (Signed)
Noted related to CLL- will respond on labs

## 2023-08-19 NOTE — Patient Instructions (Addendum)
Please stop by lab before you go If you have mychart- we will send your results within 3 business days of [place] receiving them.  If you do not have mychart- we will call you about results within 5 business days of [place] receiving them.  *please also note that you will see labs on mychart as soon as they post. I will later go in and write notes on them- will say "notes from Dr. Durene Cal"   Lets get you up and moving every hour during the daytime to help rebuild strength as recommended by physical therapy   Thrilled you are doing better- lets keep going with physical therapy   Recommended follow up: Return for next already scheduled visit or sooner if needed.

## 2023-08-19 NOTE — Telephone Encounter (Signed)
Clydie Braun called from [place] lab with critical WBC of 42.4.

## 2023-08-19 NOTE — Assessment & Plan Note (Signed)
Hopefully stable/controlled- update a1c today. Continue current meds for now - jardiance 25 mg along with lantus 50 units, novolog 25 units usually 2-3 x a day. Insulin in range on dexom 80% of time, high 18% in last 30 days. Generally doing very well Lab Results  Component Value Date   HGBA1C 6.9 03/26/2023

## 2023-08-19 NOTE — Assessment & Plan Note (Signed)
Hopefully stable- update levothyroxine 50 mcg today. Continue current meds for now  Lab Results  Component Value Date   TSH 1.34 08/22/2022

## 2023-08-19 NOTE — Assessment & Plan Note (Signed)
White blood cell(s) elevated in hospital but trended back down- update today.

## 2023-08-19 NOTE — Addendum Note (Signed)
Addended by: Shelva Majestic on: 08/19/2023 04:58 PM   Modules accepted: Level of Service

## 2023-08-19 NOTE — Assessment & Plan Note (Signed)
Simvastatin 80 mg has controlled LDL- continue current medications . Also on fenofibrate for triglyceride(s) - only mild elevations Lab Results  Component Value Date   CHOL 124 03/26/2023   HDL 48 03/26/2023   LDLCALC 32 03/26/2023   LDLDIRECT 55.0 04/22/2022   TRIG 221 (A) 03/26/2023   CHOLHDL 3 04/22/2022

## 2023-08-21 DIAGNOSIS — I499 Cardiac arrhythmia, unspecified: Secondary | ICD-10-CM | POA: Diagnosis not present

## 2023-08-21 DIAGNOSIS — I129 Hypertensive chronic kidney disease with stage 1 through stage 4 chronic kidney disease, or unspecified chronic kidney disease: Secondary | ICD-10-CM | POA: Diagnosis not present

## 2023-08-21 DIAGNOSIS — E11319 Type 2 diabetes mellitus with unspecified diabetic retinopathy without macular edema: Secondary | ICD-10-CM | POA: Diagnosis not present

## 2023-08-21 DIAGNOSIS — C9110 Chronic lymphocytic leukemia of B-cell type not having achieved remission: Secondary | ICD-10-CM | POA: Diagnosis not present

## 2023-08-21 DIAGNOSIS — E1122 Type 2 diabetes mellitus with diabetic chronic kidney disease: Secondary | ICD-10-CM | POA: Diagnosis not present

## 2023-08-21 DIAGNOSIS — N182 Chronic kidney disease, stage 2 (mild): Secondary | ICD-10-CM | POA: Diagnosis not present

## 2023-08-22 DIAGNOSIS — E11319 Type 2 diabetes mellitus with unspecified diabetic retinopathy without macular edema: Secondary | ICD-10-CM | POA: Diagnosis not present

## 2023-08-22 DIAGNOSIS — I4891 Unspecified atrial fibrillation: Secondary | ICD-10-CM

## 2023-08-22 DIAGNOSIS — I499 Cardiac arrhythmia, unspecified: Secondary | ICD-10-CM | POA: Diagnosis not present

## 2023-08-22 DIAGNOSIS — N182 Chronic kidney disease, stage 2 (mild): Secondary | ICD-10-CM | POA: Diagnosis not present

## 2023-08-22 DIAGNOSIS — I447 Left bundle-branch block, unspecified: Secondary | ICD-10-CM | POA: Diagnosis not present

## 2023-08-22 DIAGNOSIS — C9110 Chronic lymphocytic leukemia of B-cell type not having achieved remission: Secondary | ICD-10-CM | POA: Diagnosis not present

## 2023-08-22 DIAGNOSIS — I129 Hypertensive chronic kidney disease with stage 1 through stage 4 chronic kidney disease, or unspecified chronic kidney disease: Secondary | ICD-10-CM | POA: Diagnosis not present

## 2023-08-22 DIAGNOSIS — E1122 Type 2 diabetes mellitus with diabetic chronic kidney disease: Secondary | ICD-10-CM | POA: Diagnosis not present

## 2023-08-26 ENCOUNTER — Encounter: Payer: Self-pay | Admitting: Family Medicine

## 2023-08-26 DIAGNOSIS — I499 Cardiac arrhythmia, unspecified: Secondary | ICD-10-CM | POA: Diagnosis not present

## 2023-08-26 DIAGNOSIS — E1122 Type 2 diabetes mellitus with diabetic chronic kidney disease: Secondary | ICD-10-CM | POA: Diagnosis not present

## 2023-08-26 DIAGNOSIS — C9110 Chronic lymphocytic leukemia of B-cell type not having achieved remission: Secondary | ICD-10-CM | POA: Diagnosis not present

## 2023-08-26 DIAGNOSIS — E11319 Type 2 diabetes mellitus with unspecified diabetic retinopathy without macular edema: Secondary | ICD-10-CM | POA: Diagnosis not present

## 2023-08-26 DIAGNOSIS — N182 Chronic kidney disease, stage 2 (mild): Secondary | ICD-10-CM | POA: Diagnosis not present

## 2023-08-26 DIAGNOSIS — I129 Hypertensive chronic kidney disease with stage 1 through stage 4 chronic kidney disease, or unspecified chronic kidney disease: Secondary | ICD-10-CM | POA: Diagnosis not present

## 2023-08-27 NOTE — Progress Notes (Signed)
Assessment/Plan:   1.  Parkinsons Disease with levodopa resistant tremor  -Patient is status post focused ultrasound to the left VIM.  He had that done at [facility] on December 18, 2022.  -Patient is scheduled to have focused ultrasound to the right VIM on September 17, 2023 but decided to hold on that due to balance trouble and I agree with that.    -pt with R facial droop since his focused ultrasound and I told him that the R eye isn't blinking well.  He needs to use extra lubrication  -Discussed with patient that he still has Parkinsons disease and ultimately is going to need levodopa at some point.  When we previously did his levodopa challenge test, it did not show significant benefit, but that being said most of his issues were tremor and he had levodopa resistant tremor. 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  -He is following with [facility] [place] [facility].   2.  Anxiety  -Following with psychiatry at [facility].  -On low-dose mir[facility]zapine by [facility], 7.5 mg at bed  3.  Hypertension  -On several antihypertensives including hydrochlorothiazide, losartan, amlodipine.  We will need to watch this in the future given the nature of Parkinson's disease for causing dysautonomia.  4.  Diabetes, type II, insulin-dependent  -Managed by Dr. Durene Cal.  5.  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  6.  New onset A-fib/a flutter in September, 2024  -Currently not on anticoagulation.  Wearing zio patch currently  -Did tell the patient that he should certainly let [facility] neurosurgery know about the new onset A-fib/a flutter if he schedules the focused ultrasound  Subjective:   [name] was seen today in follow up for Parkinsons disease.  He had levodopa resistant tremor and was very frustrated with it.  He ended up getting referred for focused ultrasound and had that done on January 17 to the left VIM.  Notes are  reviewed.  Notes indicate that patient did very well with resolution of tremor.  He followed up in April and expressed desire to have the other side done.  That was scheduled for September 17, 2023 but they state today that he was told it wasn't fda approved (it is) but more importantly, it worsened his balanced and his wife doesn't want him to do it.  Separate from all of the above, the patient was just recently in the hospital last month.  He was very weak and EMS was contacted and they found him with A-fib with rapid ventricular response.  However, he declined going to the emergency room initially.  As time went on, he got weaker and wife eventually brought him to the emergency room and found to have fever, elevated white count from his baseline and atrial flutter.  Diagnosis upon discharge was new onset atrial flutter and left bundle branch block.  He was not placed on any anticoagulation.  He became very deconditioned with this hospitalization.  He did home health PT following it.  Current prescribed movement disorder medications:  Mirtazapine, 7.5 mg nightly (by [facility])   ALLERGIES:   Allergies  Allergen Reactions   Morphine Sulfate Itching and Rash    CURRENT MEDICATIONS:  Current Meds  Medication Sig   amLODipine (NORVASC) 10 MG tablet Take 10 mg by mouth daily.   busPIRone (BUSPAR) 10 MG tablet Take by mouth 3 (three) times daily.   Cholecalciferol (VITAMIN D3) 50  MCG (2000 UT) TABS Take 1 tablet by mouth daily.   empagliflozin (JARDIANCE) 25 MG TABS tablet Take 1 tablet (25 mg total) by mouth daily before breakfast. (GIVEN BY [facility])   fenofibrate (TRICOR) 145 MG tablet Take 160 mg by mouth daily.   gabapentin (NEURONTIN) 300 MG capsule Take 600 mg by mouth 3 (three) times daily.   glucose blood test strip Use to test 4 times daily. E11.9   hydrochlorothiazide (HYDRODIURIL) 25 MG tablet Take 25 mg by mouth daily.   insulin aspart (NOVOLOG) 100 UNIT/ML injection Inject 35 Units into the skin 3  (three) times daily with meals. When sugar is elevated   insulin glargine (LANTUS) 100 UNIT/ML injection Inject 50 Units into the skin at bedtime.   levothyroxine (SYNTHROID, LEVOTHROID) 50 MCG tablet Take 50 mcg by mouth daily before breakfast.   losartan (COZAAR) 100 MG tablet Take 100 mg by mouth daily.   mirtazapine (REMERON) 15 MG tablet Take 15 mg by mouth at bedtime.   neomycin-polymyxin b-dexamethasone (MAXITROL) 3.5-10000-0.1 SUSP Place 1 drop into both eyes 3 (three) times daily.   OVER THE COUNTER MEDICATION PreserVision for eyes   potassium chloride SA (K-DUR,KLOR-CON) 20 MEQ tablet Take 20 mEq by mouth daily.   simvastatin (ZOCOR) 80 MG tablet Take 40 mg by mouth daily at 6 PM. Take half tablet (40 mg) by mouth once daily   sucralfate (CARAFATE) 1 G tablet Take 1 g by mouth 4 (four) times daily -  with meals and at bedtime.   terbinafine (LAMISIL) 1 % cream Apply 1 Application topically 2 (two) times daily.   traMADol (ULTRAM) 50 MG tablet Take 50 mg by mouth every 6 (six) hours as needed.   zolpidem (AMBIEN) 5 MG tablet Take 5 mg by mouth at bedtime as needed for sleep.     Objective:   PHYSICAL EXAMINATION:    VITALS:   Vitals:   09/05/23 0815  BP: 124/72  Pulse: 81  SpO2: 94%  Weight: 259 lb (117.5 kg)  Height: 6' (1.829 m)       GEN:  The patient appears stated age and is in NAD. HEENT:  Normocephalic, atraumatic.  The mucous membranes are moist. The superficial temporal arteries are without ropiness or tenderness. CV:  RRR Lungs:  CTAB  Neurological examination:  Orientation: The patient is alert and oriented x3. Cranial nerves: There is mild R facial droop.  The speech is fluent and clear. Soft palate rises symmetrically and there is no tongue deviation. Hearing is significantly decreased to conversational tone. Sensation: Sensation is intact to light touch throughout Motor: Strength is 5/5 in the UE/LE  Movement examination: Tone: There is normal tone  in the upper and lower extremities Abnormal movements: there is mild LUE rest tremor.   Coordination:  There is mild decremation with finger taps on the right.   Gait and Station: The patient pushes off to arise.  He is flexed at the waist and just mildly unsteady.  He does have a walker but he show me his gait without the walker.    I have reviewed and interpreted the following labs independently    Chemistry      Component Value Date/Time   NA 140 08/19/2023 0843   NA 139 03/26/2023 0000   NA 142 10/30/2017 0845   K 4.0 08/19/2023 0843   K 3.7 10/30/2017 0845   CL 103 08/19/2023 0843   CO2 29 08/19/2023 0843   CO2 28 10/30/2017 0845  BUN 15 08/19/2023 0843   BUN 13 03/26/2023 0000   BUN 13.3 10/30/2017 0845   CREATININE 1.04 08/19/2023 0843   CREATININE 1.08 08/26/2019 0908   CREATININE 1.2 10/30/2017 0845   GLU 70 03/26/2023 0000      Component Value Date/Time   CALCIUM 8.9 08/19/2023 0843   CALCIUM 9.5 10/30/2017 0845   ALKPHOS 52 08/19/2023 0843   ALKPHOS 53 10/30/2017 0845   AST 18 08/19/2023 0843   AST 31 08/26/2019 0908   AST 37 (H) 10/30/2017 0845   ALT 23 08/19/2023 0843   ALT 29 08/26/2019 0908   ALT 37 10/30/2017 0845   BILITOT 1.3 (H) 08/19/2023 0843   BILITOT 1.3 (H) 08/26/2019 0908   BILITOT 1.23 (H) 10/30/2017 0845       Lab Results  Component Value Date   WBC 42.4 Repeated and verified X2. (HH) 08/19/2023   HGB 13.4 08/19/2023   HCT 42.9 08/19/2023   MCV 87.1 08/19/2023   PLT 217.0 08/19/2023    Lab Results  Component Value Date   TSH 1.90 08/19/2023     Total time spent on today's visit was 40 minutes, including both face-to-face time and nonface-to-face time.  Time included that spent on review of records (prior notes available to me/labs/imaging if pertinent), discussing treatment and goals, answering patient's questions and coordinating care.   Cc:  Shelva Majestic, MD

## 2023-08-28 DIAGNOSIS — E1122 Type 2 diabetes mellitus with diabetic chronic kidney disease: Secondary | ICD-10-CM | POA: Diagnosis not present

## 2023-08-28 DIAGNOSIS — N182 Chronic kidney disease, stage 2 (mild): Secondary | ICD-10-CM | POA: Diagnosis not present

## 2023-08-28 DIAGNOSIS — E11319 Type 2 diabetes mellitus with unspecified diabetic retinopathy without macular edema: Secondary | ICD-10-CM | POA: Diagnosis not present

## 2023-08-28 DIAGNOSIS — I499 Cardiac arrhythmia, unspecified: Secondary | ICD-10-CM | POA: Diagnosis not present

## 2023-08-28 DIAGNOSIS — C9110 Chronic lymphocytic leukemia of B-cell type not having achieved remission: Secondary | ICD-10-CM | POA: Diagnosis not present

## 2023-08-28 DIAGNOSIS — I129 Hypertensive chronic kidney disease with stage 1 through stage 4 chronic kidney disease, or unspecified chronic kidney disease: Secondary | ICD-10-CM | POA: Diagnosis not present

## 2023-09-02 DIAGNOSIS — I129 Hypertensive chronic kidney disease with stage 1 through stage 4 chronic kidney disease, or unspecified chronic kidney disease: Secondary | ICD-10-CM | POA: Diagnosis not present

## 2023-09-02 DIAGNOSIS — C9110 Chronic lymphocytic leukemia of B-cell type not having achieved remission: Secondary | ICD-10-CM | POA: Diagnosis not present

## 2023-09-02 DIAGNOSIS — E1122 Type 2 diabetes mellitus with diabetic chronic kidney disease: Secondary | ICD-10-CM | POA: Diagnosis not present

## 2023-09-02 DIAGNOSIS — E11319 Type 2 diabetes mellitus with unspecified diabetic retinopathy without macular edema: Secondary | ICD-10-CM | POA: Diagnosis not present

## 2023-09-02 DIAGNOSIS — I499 Cardiac arrhythmia, unspecified: Secondary | ICD-10-CM | POA: Diagnosis not present

## 2023-09-02 DIAGNOSIS — N182 Chronic kidney disease, stage 2 (mild): Secondary | ICD-10-CM | POA: Diagnosis not present

## 2023-09-04 ENCOUNTER — Ambulatory Visit: Payer: Medicare Other | Admitting: Neurology

## 2023-09-04 DIAGNOSIS — E1122 Type 2 diabetes mellitus with diabetic chronic kidney disease: Secondary | ICD-10-CM | POA: Diagnosis not present

## 2023-09-04 DIAGNOSIS — E11319 Type 2 diabetes mellitus with unspecified diabetic retinopathy without macular edema: Secondary | ICD-10-CM | POA: Diagnosis not present

## 2023-09-04 DIAGNOSIS — C9110 Chronic lymphocytic leukemia of B-cell type not having achieved remission: Secondary | ICD-10-CM | POA: Diagnosis not present

## 2023-09-04 DIAGNOSIS — I129 Hypertensive chronic kidney disease with stage 1 through stage 4 chronic kidney disease, or unspecified chronic kidney disease: Secondary | ICD-10-CM | POA: Diagnosis not present

## 2023-09-04 DIAGNOSIS — I499 Cardiac arrhythmia, unspecified: Secondary | ICD-10-CM | POA: Diagnosis not present

## 2023-09-04 DIAGNOSIS — N182 Chronic kidney disease, stage 2 (mild): Secondary | ICD-10-CM | POA: Diagnosis not present

## 2023-09-05 ENCOUNTER — Encounter: Payer: Self-pay | Admitting: Neurology

## 2023-09-05 ENCOUNTER — Ambulatory Visit (INDEPENDENT_AMBULATORY_CARE_PROVIDER_SITE_OTHER): Payer: Medicare Other | Admitting: Neurology

## 2023-09-05 VITALS — BP 124/72 | HR 81 | Ht 72.0 in | Wt 259.0 lb

## 2023-09-05 DIAGNOSIS — R29810 Facial weakness: Secondary | ICD-10-CM | POA: Diagnosis not present

## 2023-09-05 DIAGNOSIS — I4892 Unspecified atrial flutter: Secondary | ICD-10-CM | POA: Diagnosis not present

## 2023-09-05 DIAGNOSIS — I499 Cardiac arrhythmia, unspecified: Secondary | ICD-10-CM | POA: Diagnosis not present

## 2023-09-05 DIAGNOSIS — E1122 Type 2 diabetes mellitus with diabetic chronic kidney disease: Secondary | ICD-10-CM | POA: Diagnosis not present

## 2023-09-05 DIAGNOSIS — E11319 Type 2 diabetes mellitus with unspecified diabetic retinopathy without macular edema: Secondary | ICD-10-CM | POA: Diagnosis not present

## 2023-09-05 DIAGNOSIS — C9110 Chronic lymphocytic leukemia of B-cell type not having achieved remission: Secondary | ICD-10-CM | POA: Diagnosis not present

## 2023-09-05 DIAGNOSIS — G20A1 Parkinson's disease without dyskinesia, without mention of fluctuations: Secondary | ICD-10-CM

## 2023-09-05 DIAGNOSIS — N182 Chronic kidney disease, stage 2 (mild): Secondary | ICD-10-CM | POA: Diagnosis not present

## 2023-09-05 DIAGNOSIS — I129 Hypertensive chronic kidney disease with stage 1 through stage 4 chronic kidney disease, or unspecified chronic kidney disease: Secondary | ICD-10-CM | POA: Diagnosis not present

## 2023-09-09 DIAGNOSIS — C9110 Chronic lymphocytic leukemia of B-cell type not having achieved remission: Secondary | ICD-10-CM | POA: Diagnosis not present

## 2023-09-09 DIAGNOSIS — I499 Cardiac arrhythmia, unspecified: Secondary | ICD-10-CM | POA: Diagnosis not present

## 2023-09-09 DIAGNOSIS — E1122 Type 2 diabetes mellitus with diabetic chronic kidney disease: Secondary | ICD-10-CM | POA: Diagnosis not present

## 2023-09-09 DIAGNOSIS — E11319 Type 2 diabetes mellitus with unspecified diabetic retinopathy without macular edema: Secondary | ICD-10-CM | POA: Diagnosis not present

## 2023-09-09 DIAGNOSIS — I129 Hypertensive chronic kidney disease with stage 1 through stage 4 chronic kidney disease, or unspecified chronic kidney disease: Secondary | ICD-10-CM | POA: Diagnosis not present

## 2023-09-09 DIAGNOSIS — N182 Chronic kidney disease, stage 2 (mild): Secondary | ICD-10-CM | POA: Diagnosis not present

## 2023-09-11 ENCOUNTER — Ambulatory Visit (INDEPENDENT_AMBULATORY_CARE_PROVIDER_SITE_OTHER): Payer: Medicare Other

## 2023-09-11 DIAGNOSIS — E538 Deficiency of other specified B group vitamins: Secondary | ICD-10-CM

## 2023-09-11 MED ORDER — CYANOCOBALAMIN 1000 MCG/ML IJ SOLN
1000.0000 ug | Freq: Once | INTRAMUSCULAR | Status: AC
Start: 2023-09-11 — End: 2023-09-11
  Administered 2023-09-11: 1000 ug via INTRAMUSCULAR

## 2023-09-11 NOTE — Progress Notes (Signed)
Patient is in office today for a nurse visit for B12 Injection. Patient Injection was given in the  Left deltoid. Patient tolerated injection well.

## 2023-09-12 DIAGNOSIS — I4891 Unspecified atrial fibrillation: Secondary | ICD-10-CM | POA: Diagnosis not present

## 2023-09-12 DIAGNOSIS — I447 Left bundle-branch block, unspecified: Secondary | ICD-10-CM | POA: Diagnosis not present

## 2023-09-13 DIAGNOSIS — E039 Hypothyroidism, unspecified: Secondary | ICD-10-CM | POA: Diagnosis not present

## 2023-09-13 DIAGNOSIS — I129 Hypertensive chronic kidney disease with stage 1 through stage 4 chronic kidney disease, or unspecified chronic kidney disease: Secondary | ICD-10-CM | POA: Diagnosis not present

## 2023-09-13 DIAGNOSIS — C9110 Chronic lymphocytic leukemia of B-cell type not having achieved remission: Secondary | ICD-10-CM | POA: Diagnosis not present

## 2023-09-13 DIAGNOSIS — Z7984 Long term (current) use of oral hypoglycemic drugs: Secondary | ICD-10-CM | POA: Diagnosis not present

## 2023-09-13 DIAGNOSIS — Z860100 Personal history of colon polyps, unspecified: Secondary | ICD-10-CM | POA: Diagnosis not present

## 2023-09-13 DIAGNOSIS — E11319 Type 2 diabetes mellitus with unspecified diabetic retinopathy without macular edema: Secondary | ICD-10-CM | POA: Diagnosis not present

## 2023-09-13 DIAGNOSIS — Z794 Long term (current) use of insulin: Secondary | ICD-10-CM | POA: Diagnosis not present

## 2023-09-13 DIAGNOSIS — F329 Major depressive disorder, single episode, unspecified: Secondary | ICD-10-CM | POA: Diagnosis not present

## 2023-09-13 DIAGNOSIS — E785 Hyperlipidemia, unspecified: Secondary | ICD-10-CM | POA: Diagnosis not present

## 2023-09-13 DIAGNOSIS — E1169 Type 2 diabetes mellitus with other specified complication: Secondary | ICD-10-CM | POA: Diagnosis not present

## 2023-09-13 DIAGNOSIS — G20A1 Parkinson's disease without dyskinesia, without mention of fluctuations: Secondary | ICD-10-CM | POA: Diagnosis not present

## 2023-09-13 DIAGNOSIS — E1122 Type 2 diabetes mellitus with diabetic chronic kidney disease: Secondary | ICD-10-CM | POA: Diagnosis not present

## 2023-09-13 DIAGNOSIS — F4310 Post-traumatic stress disorder, unspecified: Secondary | ICD-10-CM | POA: Diagnosis not present

## 2023-09-13 DIAGNOSIS — N182 Chronic kidney disease, stage 2 (mild): Secondary | ICD-10-CM | POA: Diagnosis not present

## 2023-09-13 DIAGNOSIS — F419 Anxiety disorder, unspecified: Secondary | ICD-10-CM | POA: Diagnosis not present

## 2023-09-13 DIAGNOSIS — F0670 Mild neurocognitive disorder due to known physiological condition without behavioral disturbance: Secondary | ICD-10-CM | POA: Diagnosis not present

## 2023-09-13 DIAGNOSIS — I499 Cardiac arrhythmia, unspecified: Secondary | ICD-10-CM | POA: Diagnosis not present

## 2023-09-13 DIAGNOSIS — I890 Lymphedema, not elsewhere classified: Secondary | ICD-10-CM | POA: Diagnosis not present

## 2023-09-13 DIAGNOSIS — Z8546 Personal history of malignant neoplasm of prostate: Secondary | ICD-10-CM | POA: Diagnosis not present

## 2023-09-17 DIAGNOSIS — E11319 Type 2 diabetes mellitus with unspecified diabetic retinopathy without macular edema: Secondary | ICD-10-CM | POA: Diagnosis not present

## 2023-09-17 DIAGNOSIS — I129 Hypertensive chronic kidney disease with stage 1 through stage 4 chronic kidney disease, or unspecified chronic kidney disease: Secondary | ICD-10-CM | POA: Diagnosis not present

## 2023-09-17 DIAGNOSIS — C9110 Chronic lymphocytic leukemia of B-cell type not having achieved remission: Secondary | ICD-10-CM | POA: Diagnosis not present

## 2023-09-17 DIAGNOSIS — E1122 Type 2 diabetes mellitus with diabetic chronic kidney disease: Secondary | ICD-10-CM | POA: Diagnosis not present

## 2023-09-17 DIAGNOSIS — N182 Chronic kidney disease, stage 2 (mild): Secondary | ICD-10-CM | POA: Diagnosis not present

## 2023-09-17 DIAGNOSIS — I499 Cardiac arrhythmia, unspecified: Secondary | ICD-10-CM | POA: Diagnosis not present

## 2023-09-17 NOTE — Assessment & Plan Note (Signed)
-  Diagnosed in 2015 after presenting with lymphocytosis and adenopathy -Stage 0, on active surveillance, has not required any treatment.

## 2023-09-17 NOTE — Assessment & Plan Note (Signed)
-  Prostate cancer diagnosed in 2015. He was found to have stage T1c, Gleason score 3+4 = 7 PSA of 6.8.  -Status post robotic prostatectomy in February 2015 with the pathology revealed a Gleason score 3+4 = 7 without any evidence of extraprostatic extension.  -Under active surveillance, no evidence of recurrence so far.  PSA has been undetectable.

## 2023-09-18 ENCOUNTER — Inpatient Hospital Stay: Payer: Medicare Other | Attending: Hematology

## 2023-09-18 ENCOUNTER — Inpatient Hospital Stay: Payer: Medicare Other | Admitting: Hematology

## 2023-09-18 ENCOUNTER — Encounter: Payer: Self-pay | Admitting: Family Medicine

## 2023-09-18 VITALS — BP 137/67 | HR 80 | Temp 97.8°F | Resp 18 | Ht 72.0 in | Wt 265.5 lb

## 2023-09-18 DIAGNOSIS — C9110 Chronic lymphocytic leukemia of B-cell type not having achieved remission: Secondary | ICD-10-CM | POA: Diagnosis not present

## 2023-09-18 DIAGNOSIS — G20A1 Parkinson's disease without dyskinesia, without mention of fluctuations: Secondary | ICD-10-CM | POA: Insufficient documentation

## 2023-09-18 DIAGNOSIS — Z9079 Acquired absence of other genital organ(s): Secondary | ICD-10-CM | POA: Diagnosis not present

## 2023-09-18 DIAGNOSIS — Z8546 Personal history of malignant neoplasm of prostate: Secondary | ICD-10-CM

## 2023-09-18 LAB — CBC WITH DIFFERENTIAL (CANCER CENTER ONLY)
Abs Immature Granulocytes: 0.11 10*3/uL — ABNORMAL HIGH (ref 0.00–0.07)
Basophils Absolute: 0.2 10*3/uL — ABNORMAL HIGH (ref 0.0–0.1)
Basophils Relative: 0 %
Eosinophils Absolute: 0.3 10*3/uL (ref 0.0–0.5)
Eosinophils Relative: 1 %
HCT: 43.1 % (ref 39.0–52.0)
Hemoglobin: 14.1 g/dL (ref 13.0–17.0)
Immature Granulocytes: 0 %
Lymphocytes Relative: 72 %
Lymphs Abs: 35.9 10*3/uL — ABNORMAL HIGH (ref 0.7–4.0)
MCH: 28 pg (ref 26.0–34.0)
MCHC: 32.7 g/dL (ref 30.0–36.0)
MCV: 85.5 fL (ref 80.0–100.0)
Monocytes Absolute: 8.2 10*3/uL — ABNORMAL HIGH (ref 0.1–1.0)
Monocytes Relative: 16 %
Neutro Abs: 5.3 10*3/uL (ref 1.7–7.7)
Neutrophils Relative %: 11 %
Platelet Count: 171 10*3/uL (ref 150–400)
RBC: 5.04 MIL/uL (ref 4.22–5.81)
RDW: 15 % (ref 11.5–15.5)
Smear Review: NORMAL
WBC Count: 50 10*3/uL — ABNORMAL HIGH (ref 4.0–10.5)
nRBC: 0 % (ref 0.0–0.2)

## 2023-09-18 LAB — COMPREHENSIVE METABOLIC PANEL
ALT: 25 U/L (ref 0–44)
AST: 26 U/L (ref 15–41)
Albumin: 4.3 g/dL (ref 3.5–5.0)
Alkaline Phosphatase: 58 U/L (ref 38–126)
Anion gap: 8 (ref 5–15)
BUN: 12 mg/dL (ref 8–23)
CO2: 28 mmol/L (ref 22–32)
Calcium: 9.4 mg/dL (ref 8.9–10.3)
Chloride: 105 mmol/L (ref 98–111)
Creatinine, Ser: 1.08 mg/dL (ref 0.61–1.24)
GFR, Estimated: >60 mL/min (ref >60)
Glucose, Bld: 187 mg/dL — ABNORMAL HIGH (ref 70–99)
Potassium: 4.1 mmol/L (ref 3.5–5.1)
Sodium: 141 mmol/L (ref 135–145)
Total Bilirubin: 0.9 mg/dL (ref 0.3–1.2)
Total Protein: 6.4 g/dL — ABNORMAL LOW (ref 6.5–8.1)

## 2023-09-18 NOTE — Progress Notes (Signed)
[facility]   [phone] [phone]   Clinic Follow up Note   Patient Care Team: Shelva Majestic, MD as PCP - General (Family Medicine) Sherrie George, MD as Consulting Physician (Ophthalmology) Janalyn Harder, MD (Inactive) as Consulting Physician (Dermatology) Erroll Luna, [facility] (Inactive) (Pharmacist)  Date of Service:  09/18/2023  CHIEF COMPLAINT: f/u of CLL  CURRENT THERAPY:  Observation  Oncology History   Chronic lymphocytic leukemia (HCC) -Diagnosed in 2015 after presenting with lymphocytosis and adenopathy -Stage 0, on active surveillance, has not required any treatment.  History of prostate cancer -Prostate cancer diagnosed in 2015. He was found to have stage T1c, Gleason score 3+4 = 7 PSA of 6.8.  -Status post robotic prostatectomy in February 2015 with the pathology revealed a Gleason score 3+4 = 7 without any evidence of extraprostatic extension.  -Under active surveillance, no evidence of recurrence so far.  PSA has been undetectable.   Assessment and Plan    Chronic Lymphocytic Leukemia (CLL) Stable disease with slow increase in lymphocyte count over the past year, with doubling time more than 6 months.  He has no anemia or thrombocytopenia.  No symptoms of night sweats or weight loss. Hemoglobin and platelet count within normal limits.  Says no clinical indication for treatment. -Continue monitoring with blood counts every six months. -Next follow-up visit in one year.  General Health Maintenance Patient's wife reports plans to initiate slow exercise regimen to help with patient's leg strength and Parkinson's symptoms. -Encourage continuation of planned exercise regimen. -Order blood counts with differential to be done at primary care physician's office in six months.      Plan -He will repeat a CBC with differential with his primary care physician Dr. Durene Cal on next visit -Lab and follow-up with me in 1  year   Discussed the use of AI scribe software for clinical note transcription with the patient, who gave verbal consent to proceed.   History of Present Illness   The patient, an 82 year old with a history of Chronic Lymphocytic Leukemia (CLL), presents for a routine follow-up visit. He reports no new symptoms or changes in his condition since his last visit. He has been maintaining his current medication regimen without any alterations. The patient also mentions a history of brain surgery, which has resulted in balance issues. He uses a walker for mobility but did not bring it to the visit. The patient's wife, who is actively involved in his care, reports that she checks the patient's vitals, including blood pressure and sugar levels, daily. She also checks his feet and legs regularly. The patient's wife is planning to encourage the patient to engage in slow exercises to help with his leg strength.        All other systems were reviewed with the patient and are negative.  MEDICAL HISTORY:  Past Medical History:  Diagnosis Date   Arthritis    Basal cell carcinoma 03/18/2017   left neck CX3 5FU   Bell's palsy 05/08/2020   Cataract    Chronic bilateral low back pain without sciatica 12/12/2015   Chronic lymphocytic leukemia 05/16/2014   Oncology q6 months. Thought to be agent orange related-CLL and prostate cancer  Diagnosis 1. 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 - NO EVIDENCE OF EXTRAPROSTATIC EXTENSION, ANGIOLYMPHATIC INVASION OR SEMINAL VESICLE INVOLVEMENT IDENTIFIED. - RESECTION MARGINS, NEGATIVE FOR ATYPIA OR MALIGNANCY. 2. Lymph nodes, regional resection, righ   Chronic pain syndrome 02/01/2016  Chronic right hip pain 09/27/2019   DDD (degenerative disc disease), lumbar    Diabetic retinopathy 09/24/2007   Diverticulosis of colon    Elevated PSA    Erectile dysfunction 12/30/2007   No rx.      GERD  (gastroesophageal reflux disease)    Gynecomastia 12/28/2020   History of colonic polyps 08/22/2008   2004 3 adenomas 2009 none 2013 none 02/26/2017 8 mm sigmoid polyp was inflammatory - no recall planned given age/co-morbidities overall hx of polyps     History of prostate cancer 01/24/2014   Follows with Dr. Wilson Singer every 3 months. Dr. Clelia Croft every 6 months.  Diagnosis 1. 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 - NO EVIDENCE OF EXTRAPROSTATIC EXTENSION, ANGIOLYMPHATIC INVASION OR SEMINAL VESICLE INVOLVEMENT IDENTIFIED. - RESECTION MARGINS, NEGATIVE FOR ATYPIA OR MALIGNANCY. 2. Lymph nodes, regional resection, right pelvic - SIX   Hyperlipidemia associated with type 2 diabetes mellitus 09/24/2007   High triglycerides. Refused statin due to myalgias in past but then started half tablet of simvastatin 40mg  through [facility] of this note might be different from the original. Formatting of this note might be different from the original. High triglycerides. Refused statin due to myalgias in past but then started half tablet of simvastatin 40mg  through [place]  Last Assessment & Plan:  Formatting    Hypertension associated with diabetes 09/24/2007   Amlodipine 10mg , losartan 100mg , HCTZ 25mg , propranolol 40 mg twice a day Home cuff 142/85 compared to 134/72 on my check. Likely home cuff about 10 points higher.   --> chlorthalidone 03/20/16 but patient didn't change and then BP controlled on next visit   Formatting of this note might be different from the original. Formatting of this note might be different from the original. 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  Follow up in 6 months unless wound issues arise.  Derm follow up in October 2022 scheduled.  I will see him back in 6 months.   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 a1c under 8     Formatting of this note might be different from the original. Formatting of this note might be different from the original. And macular degeneration.   Last Assessment & Plan:  Formatting of this note might be different from the original. S: diabetic retinopathy followed up by optho yesterday and largely stable. He also has m   Vitamin D deficiency 11/01/2020   At [facility]- on 2000 units a day at least since 2021     SURGICAL HISTORY: Past Surgical History:  Procedure Laterality Date   CHOLECYSTECTOMY  COLONOSCOPY  03/25/12, 09/28/08   ESOPHAGOGASTRODUODENOSCOPY N/A 10/12/2014   Procedure: ESOPHAGOGASTRODUODENOSCOPY (EGD);  Surgeon: Iva Boop, MD;  Location: [facility];  Service: Endoscopy;  Laterality: N/A;   ESOPHAGOGASTRODUODENOSCOPY ENDOSCOPY  09/28/08   EXCISION MELANOMA WITH SENTINEL LYMPH NODE BIOPSY Right 05/31/2021   Procedure: WIDE LOCAL EXCISION RIGHT FOREARM WITH ADVANCEDMENT FLAP CLOSURE WITH SENTINEL LYMPH NODE MAPPING AND BIOSPY;  Surgeon: Almond Lint, MD;  Location: [facility];  Service: General;  Laterality: Right;   FOOT SURGERY Bilateral    HEMORRHOID SURGERY     LAPAROSCOPIC GASTRIC BANDING  03/05/11   weight loss   LAPAROSCOPIC GASTRIC BANDING  WITH HIATAL HERNIA REPAIR  03/05/2011   LYMPHADENECTOMY Bilateral 01/24/2014   Procedure: LYMPHADENECTOMY;  Surgeon: Crecencio Mc, MD;  Location: [facility];  Service: Urology;  Laterality: Bilateral;   ORIF TIBIA FRACTURE Right    PENILE PROSTHESIS IMPLANT     PROSTATE SURGERY  01/2014   ROBOT ASSISTED LAPAROSCOPIC RADICAL PROSTATECTOMY N/A 01/24/2014   Procedure: ROBOTIC ASSISTED LAPAROSCOPIC RADICAL PROSTATECTOMY LEVEL 3;  Surgeon: Crecencio Mc, MD;  Location: [facility];  Service: Urology;  Laterality: N/A;   SHOULDER SURGERY Right 2011   TONSILLECTOMY     age 61   VASECTOMY      I have reviewed the social history and family history with the patient and they are unchanged from previous note.  ALLERGIES:  is allergic to morphine sulfate.  MEDICATIONS:  Current Outpatient Medications  Medication Sig Dispense Refill   amLODipine (NORVASC) 10 MG tablet Take 10 mg by mouth daily.     busPIRone (BUSPAR) 10 MG tablet Take by mouth 3 (three) times daily.     Cholecalciferol (VITAMIN D3) 50 MCG (2000 UT) TABS Take 1 tablet by mouth daily.     empagliflozin (JARDIANCE) 25 MG TABS tablet Take 1 tablet (25 mg total) by mouth daily before breakfast. (GIVEN BY [facility]) 30 tablet    fenofibrate (TRICOR) 145 MG tablet Take 160 mg by mouth daily.     gabapentin (NEURONTIN) 300 MG capsule Take 600 mg by mouth 3 (three) times daily.     glucose blood test strip Use to test 4 times daily. E11.9 100 each 12   hydrochlorothiazide (HYDRODIURIL) 25 MG tablet Take 25 mg by mouth daily.     insulin aspart (NOVOLOG) 100 UNIT/ML injection Inject 35 Units into the skin 3 (three) times daily with meals. When sugar is elevated     insulin glargine (LANTUS) 100 UNIT/ML injection Inject 50 Units into the skin at bedtime.     levothyroxine (SYNTHROID, LEVOTHROID) 50 MCG tablet Take 50 mcg by mouth daily before breakfast.     losartan (COZAAR) 100 MG tablet Take 100 mg by mouth daily.     mirtazapine (REMERON) 15 MG tablet Take 15 mg by  mouth at bedtime.     neomycin-polymyxin b-dexamethasone (MAXITROL) 3.5-10000-0.1 SUSP Place 1 drop into both eyes 3 (three) times daily.     OVER THE COUNTER MEDICATION PreserVision for eyes     potassium chloride SA (K-DUR,KLOR-CON) 20 MEQ tablet Take 20 mEq by mouth daily.     simvastatin (ZOCOR) 80 MG tablet Take 40 mg by mouth daily at 6 PM. Take half tablet (40 mg) by mouth once daily     sucralfate (CARAFATE) 1 G tablet Take 1 g by mouth 4 (four) times daily -  with meals and at bedtime.     terbinafine (LAMISIL) 1 % cream Apply 1 Application topically 2 (  two) times daily.     traMADol (ULTRAM) 50 MG tablet Take 50 mg by mouth every 6 (six) hours as needed.     zolpidem (AMBIEN) 5 MG tablet Take 5 mg by mouth at bedtime as needed for sleep.     No current facility-administered medications for this visit.    PHYSICAL EXAMINATION: ECOG PERFORMANCE STATUS: 2 - Symptomatic, <50% confined to bed  Vitals:   09/18/23 1223  BP: 137/67  Pulse: 80  Resp: 18  Temp: 97.8 F (36.6 C)  SpO2: 97%   Wt Readings from Last 3 Encounters:  09/18/23 265 lb 8 oz (120.4 kg)  09/05/23 259 lb (117.5 kg)  08/19/23 260 lb 9.6 oz (118.2 kg)     GENERAL:alert, no distress and comfortable SKIN: skin color, texture, turgor are normal, no rashes or significant lesions EYES: normal, Conjunctiva are pink and non-injected, sclera clear NECK: supple, thyroid normal size, non-tender, without nodularity LYMPH:  no palpable lymphadenopathy in the cervical, axillary  LUNGS: clear to auscultation and percussion with normal breathing effort HEART: regular rate & rhythm and no murmurs and no lower extremity edema ABDOMEN:abdomen soft, non-tender and normal bowel sounds Musculoskeletal:no cyanosis of digits and no clubbing  NEURO: alert & oriented x 3 with fluent speech, no focal motor/sensory deficits  Physical Exam   CHEST: Mild wheezing noted.      LABORATORY DATA:  I have reviewed the data as  listed    Latest Ref Rng & Units 09/18/2023   11:54 AM 08/19/2023    8:43 AM 08/10/2023    7:27 AM  CBC  WBC 4.0 - 10.5 K/uL 50.0  42.4 Repeated and verified X2.  31.9   Hemoglobin 13.0 - 17.0 g/dL 16.1  09.6  04.5   Hematocrit 39.0 - 52.0 % 43.1  42.9  38.9   Platelets 150 - 400 K/uL 171  217.0  123         Latest Ref Rng & Units 09/18/2023   11:54 AM 08/19/2023    8:43 AM 08/10/2023    7:27 AM  CMP  Glucose 70 - 99 mg/dL 409  811  914   BUN 8 - 23 mg/dL 12  15  12    Creatinine 0.61 - 1.24 mg/dL 7.82  9.56  2.13   Sodium 135 - 145 mmol/L 141  140  137   Potassium 3.5 - 5.1 mmol/L 4.1  4.0  3.7   Chloride 98 - 111 mmol/L 105  103  105   CO2 22 - 32 mmol/L 28  29  21    Calcium 8.9 - 10.3 mg/dL 9.4  8.9  7.8   Total Protein 6.5 - 8.1 g/dL 6.4  6.1    Total Bilirubin 0.3 - 1.2 mg/dL 0.9  1.3    Alkaline Phos 38 - 126 U/L 58  52    AST 15 - 41 U/L 26  18    ALT 0 - 44 U/L 25  23        RADIOGRAPHIC STUDIES: I have personally reviewed the radiological images as listed and agreed with the findings in the report. No results found.    No orders of the defined types were placed in this encounter.  All questions were answered. The patient knows to call the clinic with any problems, questions or concerns. No barriers to learning was detected. The total time spent in the appointment was 15 minutes.     Malachy Mood, MD 09/18/2023

## 2023-09-19 LAB — PROSTATE-SPECIFIC AG, SERUM (LABCORP): Prostate Specific Ag, Serum: <0.1 ng/mL (ref 0.0–4.0)

## 2023-09-25 ENCOUNTER — Encounter (INDEPENDENT_AMBULATORY_CARE_PROVIDER_SITE_OTHER): Payer: Medicare Other | Admitting: Ophthalmology

## 2023-09-25 DIAGNOSIS — H353231 Exudative age-related macular degeneration, bilateral, with active choroidal neovascularization: Secondary | ICD-10-CM | POA: Diagnosis not present

## 2023-09-25 DIAGNOSIS — I10 Essential (primary) hypertension: Secondary | ICD-10-CM | POA: Diagnosis not present

## 2023-09-25 DIAGNOSIS — H43813 Vitreous degeneration, bilateral: Secondary | ICD-10-CM | POA: Diagnosis not present

## 2023-09-25 DIAGNOSIS — H35033 Hypertensive retinopathy, bilateral: Secondary | ICD-10-CM

## 2023-10-12 NOTE — Progress Notes (Unsigned)
Cardiology Office Note:    Date:  10/14/2023   ID:  [name], DOB 1941-03-24, MRN [id number]  PCP:  Shelva Majestic, MD  Cardiologist:  None  Electrophysiologist:  None   Referring MD: Shelva Majestic, MD   Chief Complaint  Patient presents with   Edema    Pt stated that his right ankle swell    History of Present Illness:    [name] is a 82 y.o. male with a hx of hypertension, T2DM, CLL, Parkinson's disease, OSA, prostate cancer who presents for follow-up.  He was admitted 08/2023 with fever and AKI.  No clear source found, thought to be possible viral syndrome.  Kidney function improved with IV fluids.  Suspected he had new onset atrial flutter and cardiology was consulted but on review appeared to be sinus rhythm with PACs.  Echocardiogram 08/09/2023 showed EF 55 to 60%, normal RV function, no significant valvular disease.  He was discharged with Zio patch x 14 days, which showed 1 episode of NSVT lasting 6 beats, 153 episodes of SVT with longest lasting 38 seconds with average rate 113 bpm, frequent PACs (21% of beats).  Since discharge from hospital, he reports he is doing okay.  Denies any chest pain or dyspnea.  Reports some lightheadedness but denies any syncope.  Does report some lower extremity edema.  Reports compliance with CPAP.  He walks with a walker.  Reports he tires out easily.   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 Thought to be agent orange related-CLL and prostate cancer  Diagnosis 1. 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 - NO EVIDENCE OF EXTRAPROSTATIC EXTENSION, ANGIOLYMPHATIC INVASION OR SEMINAL VESICLE INVOLVEMENT IDENTIFIED. - RESECTION MARGINS,  NEGATIVE FOR ATYPIA OR MALIGNANCY. 2. Lymph nodes, regional resection, righ   Chronic pain syndrome 02/01/2016   Chronic right hip pain 09/27/2019   DDD (degenerative disc disease), lumbar    Diabetic retinopathy 09/24/2007   Diverticulosis of colon    Elevated PSA    Erectile dysfunction 12/30/2007   No rx.      GERD (gastroesophageal reflux disease)    Gynecomastia 12/28/2020   History of colonic polyps 08/22/2008   2004 3 adenomas 2009 none 2013 none 02/26/2017 8 mm sigmoid polyp was inflammatory - no recall planned given age/co-morbidities overall hx of polyps     History of prostate cancer 01/24/2014   Follows with Dr. Wilson Singer every 3 months. Dr. Clelia Croft every 6 months.  Diagnosis 1. 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 - NO EVIDENCE OF EXTRAPROSTATIC EXTENSION, ANGIOLYMPHATIC INVASION OR SEMINAL VESICLE INVOLVEMENT IDENTIFIED. - RESECTION MARGINS, NEGATIVE FOR ATYPIA OR MALIGNANCY. 2. Lymph nodes, regional resection, right pelvic - SIX   Hyperlipidemia associated with type 2 diabetes mellitus 09/24/2007   High triglycerides. Refused statin due to myalgias in past but then started half tablet of simvastatin 40mg  through [facility] of this note might be different from the original. Formatting of this note might be different from the original. High triglycerides. Refused statin due to myalgias in past but then started half tablet of simvastatin 40mg  through [place]  Last Assessment & Plan:  Formatting    Hypertension associated  with diabetes 09/24/2007   Amlodipine 10mg , losartan 100mg , HCTZ 25mg , propranolol 40 mg twice a day Home cuff 142/85 compared to 134/72 on my check. Likely home cuff about 10 points higher.   --> chlorthalidone 03/20/16 but patient didn't change and then BP controlled on next visit   Formatting of this note might be different from the original. Formatting of this note might be different from the original.  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  Follow up in 6 months unless wound issues arise.  Derm follow up in October 2022 scheduled.  I will see him back in 6 months.   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 a1c under 8     Formatting of this note might be different from the original. Formatting of this note might be different from the original. And macular degeneration.   Last Assessment & Plan:  Formatting of this note might be different from the original. S: diabetic retinopathy followed up by optho yesterday and largely stable. He also has m   Vitamin D deficiency 11/01/2020   At [facility]- on 2000 units a day at least since 2021     Past Surgical History:  Procedure Laterality Date   CHOLECYSTECTOMY     COLONOSCOPY  03/25/12, 09/28/08   ESOPHAGOGASTRODUODENOSCOPY N/A 10/12/2014   Procedure: ESOPHAGOGASTRODUODENOSCOPY (EGD);  Surgeon: Iva Boop, MD;  Location: [facility];  Service: Endoscopy;  Laterality: N/A;   ESOPHAGOGASTRODUODENOSCOPY ENDOSCOPY  09/28/08   EXCISION MELANOMA WITH SENTINEL LYMPH NODE BIOPSY Right 05/31/2021   Procedure: WIDE LOCAL EXCISION RIGHT FOREARM WITH  ADVANCEDMENT FLAP CLOSURE WITH SENTINEL LYMPH NODE MAPPING AND BIOSPY;  Surgeon: Almond Lint, MD;  Location: [facility];  Service: General;  Laterality: Right;   FOOT SURGERY Bilateral    HEMORRHOID SURGERY     LAPAROSCOPIC GASTRIC BANDING  03/05/11   weight loss   LAPAROSCOPIC GASTRIC BANDING WITH HIATAL HERNIA REPAIR  03/05/2011   LYMPHADENECTOMY Bilateral 01/24/2014   Procedure: LYMPHADENECTOMY;  Surgeon: Crecencio Mc, MD;  Location: [facility];  Service: Urology;  Laterality: Bilateral;   ORIF TIBIA FRACTURE Right    PENILE PROSTHESIS IMPLANT     PROSTATE SURGERY  01/2014   ROBOT ASSISTED LAPAROSCOPIC RADICAL PROSTATECTOMY N/A 01/24/2014   Procedure: ROBOTIC ASSISTED LAPAROSCOPIC RADICAL PROSTATECTOMY LEVEL 3;  Surgeon: Crecencio Mc, MD;  Location: [facility];  Service: Urology;  Laterality: N/A;   SHOULDER SURGERY Right 2011   TONSILLECTOMY     age 36   VASECTOMY      Current Medications: Current Meds  Medication Sig   amLODipine (NORVASC) 10 MG tablet Take 10 mg  by mouth daily.   busPIRone (BUSPAR) 10 MG tablet Take by mouth 3 (three) times daily.   Cholecalciferol (VITAMIN D3) 50 MCG (2000 UT) TABS Take 1 tablet by mouth daily.   empagliflozin (JARDIANCE) 25 MG TABS tablet Take 1 tablet (25 mg total) by mouth daily before breakfast. ([facility])   fenofibrate (TRICOR) 145 MG tablet Take 160 mg by mouth daily.   gabapentin (NEURONTIN) 300 MG capsule Take 600 mg by mouth 3 (three) times daily.   glucose blood test strip Use to test 4 times daily. E11.9   hydrochlorothiazide (HYDRODIURIL) 25 MG tablet Take 25 mg by mouth daily.   insulin aspart (NOVOLOG) 100 UNIT/ML injection Inject 35 Units into the skin 3 (three) times daily with meals. When sugar is elevated   insulin glargine (LANTUS) 100 UNIT/ML injection Inject 50 Units into the skin at bedtime.   levothyroxine (SYNTHROID, LEVOTHROID) 50 MCG tablet Take 50 mcg by mouth daily before breakfast.   losartan (COZAAR) 100 MG tablet  Take 100 mg by mouth daily.   mirtazapine (REMERON) 15 MG tablet Take 15 mg by mouth at bedtime.   neomycin-polymyxin b-dexamethasone (MAXITROL) 3.5-10000-0.1 SUSP Place 1 drop into both eyes 3 (three) times daily.   OVER THE COUNTER MEDICATION PreserVision for eyes   potassium chloride SA (K-DUR,KLOR-CON) 20 MEQ tablet Take 20 mEq by mouth daily.   simvastatin (ZOCOR) 80 MG tablet Take 40 mg by mouth daily at 6 PM. Take half tablet (40 mg) by mouth once daily   sucralfate (CARAFATE) 1 G tablet Take 1 g by mouth 4 (four) times daily -  with meals and at bedtime.   terbinafine (LAMISIL) 1 % cream Apply 1 Application topically 2 (two) times daily.   traMADol (ULTRAM) 50 MG tablet Take 50 mg by mouth every 6 (six) hours as needed.   zolpidem (AMBIEN) 5 MG tablet Take 5 mg by mouth at bedtime as needed for sleep.     Allergies:   Morphine sulfate   Social History   Socioeconomic History   Marital status: Married    Spouse name: Not on file   Number of children: 5   Years of education: 14   Highest education level: Associate degree: occupational, Scientist, product/process development, or vocational program  Occupational History   Occupation: retired    Associate Professor: RETIRED    Comment: army x 2 years; Designer, fashion/clothing; Personnel officer  Tobacco Use   Smoking status: Former    Current packs/day: 0.00    Average packs/day: 1 pack/day for 20.0 years (20.0 ttl pk-yrs)    Types: Cigarettes    Start date: 02/25/1961    Quit date: 02/25/1981    Years since quitting: 42.6   Smokeless tobacco: Never  Vaping Use   Vaping status: Never Used  Substance and Sexual Activity   Alcohol use: Not Currently    Comment: stopped drinking 76 or 77    Drug use: No   Sexual activity: Yes    Comment: Been able to utilize his penile prosthesis successfully  Other Topics Concern   Not on file  Social History Narrative   Married. 3 children from previous marriage, 2 step children. 15 grandkids.       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 degeneration could not, watch tv ([employer])   Social Determinants of Health   Financial Resource Strain: Low Risk  (10/10/2023)   Overall Financial Resource Strain (CARDIA)    Difficulty of Paying Living  Expenses: Not hard at all  Food Insecurity: No Food Insecurity (10/10/2023)   Hunger Vital Sign    Worried About Running Out of Food in the Last Year: Never true    Ran Out of Food in the Last Year: Never true  Transportation Needs: No Transportation Needs (10/10/2023)   PRAPARE - Administrator, Civil Service (Medical): No    Lack of Transportation (Non-Medical): No  Physical Activity: Insufficiently Active (10/10/2023)   Exercise Vital Sign    Days of Exercise per Week: 1 day    Minutes of Exercise per Session: 10 min  Stress: Stress Concern Present (10/10/2023)   [employer] of Occupational Health - Occupational Stress Questionnaire    Feeling of Stress : To some extent  Social Connections: Socially Integrated (10/10/2023)   Social Connection and Isolation Panel [NHANES]    Frequency of Communication with Friends and Family: Twice a week    Frequency of Social Gatherings with Friends and Family: Twice a week    Attends Religious Services: 1 to 4 times per year    Active Member of [employer] or Organizations: Yes    Attends Banker Meetings: 1 to 4 times per year    Marital Status: Married     Family History: The patient's family history includes Alcohol abuse in his brother; Diabetes in his child; Heart attack in his paternal uncle; Hypertension in his child, mother, and paternal uncle; Parkinson's disease in an other family member; Stomach cancer (age of onset: 12) in his mother. There is no history of Colon cancer, Colon polyps, or Rectal cancer.  ROS:   Please see the history of present illness.     All other systems reviewed and are negative.  EKGs/Labs/Other Studies Reviewed:    The following studies were reviewed  today:   EKG:    Recent Labs: 08/09/2023: Magnesium 1.7 08/19/2023: TSH 1.90 09/18/2023: ALT 25; BUN 12; Creatinine, Ser 1.08; Hemoglobin 14.1; Platelet Count 171; Potassium 4.1; Sodium 141  Recent Lipid Panel    Component Value Date/Time   CHOL 124 03/26/2023 0000   TRIG 221 (A) 03/26/2023 0000   HDL 48 03/26/2023 0000   CHOLHDL 3 04/22/2022 0859   VLDL 74.4 (H) 04/22/2022 0859   LDLCALC 32 03/26/2023 0000   LDLDIRECT 55.0 04/22/2022 0859    Physical Exam:    VS:  BP 109/60 (BP Location: Left Arm, Patient Position: Sitting, Cuff Size: Large)   Pulse (!) 54   Ht 6' (1.829 m)   Wt 259 lb (117.5 kg)   SpO2 94%   BMI 35.13 kg/m     Wt Readings from Last 3 Encounters:  10/14/23 259 lb (117.5 kg)  09/18/23 265 lb 8 oz (120.4 kg)  09/05/23 259 lb (117.5 kg)     GEN:  Well nourished, well developed in no acute distress HEENT: Normal NECK: No JVD; No carotid bruits CARDIAC: irregular, no murmurs RESPIRATORY:  Clear to auscultation without rales, wheezing or rhonchi  ABDOMEN: Soft, non-tender, non-distended MUSCULOSKELETAL:  trace edema; No deformity  SKIN: Warm and dry NEUROLOGIC:  Alert and oriented x 3 PSYCHIATRIC:  Normal affect   ASSESSMENT:    1. SVT (supraventricular tachycardia) (HCC)   2. Lightheadedness   3. Hyperlipidemia associated with type 2 diabetes mellitus   4. Essential hypertension    PLAN:    SVT/PACs:Zio patch x 14 days 09/2023 showed 1 episode of NSVT lasting 6 beats, 153 episodes of SVT with longest lasting 38 seconds with  average rate 113 bpm, frequent PACs (21% of beats).  Echocardiogram 08/09/2023 showed EF 55 to 60%, normal RV function, no significant valvular disease. -Resting heart rate in 50s and appears asymptomatic, will hold off on starting beta-blocker.  Check BMET, magnesium  Lightheadedness: Check carotid duplex  Hypertension: On amlodipine 10 mg daily, HCTZ 25 mg daily, losartan 100 mg daily.  Appears controlled  Hyperlipidemia:  On simvastatin 40 mg daily and Tricor 145 mg daily  T2DM: On insulin, Jardiance.  A1c 7.1% 08/19/2023  OSA: Reports compliance with CPAP  RTC in 6 months  Medication Adjustments/Labs and Tests Ordered: Current medicines are reviewed at length with the patient today.  Concerns regarding medicines are outlined above.  Orders Placed This Encounter  Procedures   Basic Metabolic Panel (BMET)   Magnesium   VAS US CAROTID   No orders of the defined types were placed in this encounter.   Patient Instructions  Medication Instructions:  Continue all current medications *If you need a refill on your cardiac medications before your next appointment, please call your pharmacy*   Lab Work: BMET, MG If you have labs (blood work) drawn today and your tests are completely normal, you will receive your results only by: MyChart Message (if you have MyChart) OR A paper copy in the mail If you have any lab test that is abnormal or we need to change your treatment, we will call you to review the results.   Testing/Procedures: Your physician has requested that you have a carotid duplex. This test is an ultrasound of the carotid arteries in your neck. It looks at blood flow through these arteries that supply the brain with blood. Allow one hour for this exam. There are no restrictions or special instructions. No prep.   Follow-Up: At [facility], you and your health needs are our priority.  As part of our continuing mission to provide you with exceptional heart care, we have created designated Provider Care Teams.  These Care Teams include your primary Cardiologist (physician) and Advanced Practice Providers (APPs -  Physician Assistants and Nurse Practitioners) who all work together to provide you with the care you need, when you need it.  We recommend signing up for the patient portal called "MyChart".  Sign up information is provided on this After Visit Summary.  MyChart is used to  connect with patients for Virtual Visits (Telemedicine).  Patients are able to view lab/test results, encounter notes, upcoming appointments, etc.  Non-urgent messages can be sent to your provider as well.   To learn more about what you can do with MyChart, go to [email or web address].    Your next appointment:   6 month(s)  Provider:   Dr. Bjorn Pippin     Signed, Little Ishikawa, MD  10/14/2023 3:07 PM    [facility]

## 2023-10-14 ENCOUNTER — Ambulatory Visit: Payer: Medicare Other | Attending: Cardiology | Admitting: Cardiology

## 2023-10-14 ENCOUNTER — Encounter: Payer: Self-pay | Admitting: Cardiology

## 2023-10-14 ENCOUNTER — Ambulatory Visit: Payer: Medicare Other | Admitting: Family Medicine

## 2023-10-14 VITALS — BP 109/60 | HR 54 | Ht 72.0 in | Wt 259.0 lb

## 2023-10-14 DIAGNOSIS — E1169 Type 2 diabetes mellitus with other specified complication: Secondary | ICD-10-CM | POA: Insufficient documentation

## 2023-10-14 DIAGNOSIS — R42 Dizziness and giddiness: Secondary | ICD-10-CM | POA: Insufficient documentation

## 2023-10-14 DIAGNOSIS — I10 Essential (primary) hypertension: Secondary | ICD-10-CM | POA: Insufficient documentation

## 2023-10-14 DIAGNOSIS — I471 Supraventricular tachycardia, unspecified: Secondary | ICD-10-CM | POA: Insufficient documentation

## 2023-10-14 DIAGNOSIS — E785 Hyperlipidemia, unspecified: Secondary | ICD-10-CM | POA: Diagnosis not present

## 2023-10-14 NOTE — Patient Instructions (Signed)
Medication Instructions:  Continue all current medications *If you need a refill on your cardiac medications before your next appointment, please call your pharmacy*   Lab Work: BMET, MG If you have labs (blood work) drawn today and your tests are completely normal, you will receive your results only by: MyChart Message (if you have MyChart) OR A paper copy in the mail If you have any lab test that is abnormal or we need to change your treatment, we will call you to review the results.   Testing/Procedures: Your physician has requested that you have a carotid duplex. This test is an ultrasound of the carotid arteries in your neck. It looks at blood flow through these arteries that supply the brain with blood. Allow one hour for this exam. There are no restrictions or special instructions. No prep.   Follow-Up: At [facility], you and your health needs are our priority.  As part of our continuing mission to provide you with exceptional heart care, we have created designated Provider Care Teams.  These Care Teams include your primary Cardiologist (physician) and Advanced Practice Providers (APPs -  Physician Assistants and Nurse Practitioners) who all work together to provide you with the care you need, when you need it.  We recommend signing up for the patient portal called "MyChart".  Sign up information is provided on this After Visit Summary.  MyChart is used to connect with patients for Virtual Visits (Telemedicine).  Patients are able to view lab/test results, encounter notes, upcoming appointments, etc.  Non-urgent messages can be sent to your provider as well.   To learn more about what you can do with MyChart, go to [email or web address].    Your next appointment:   6 month(s)  Provider:   Dr. Bjorn Pippin

## 2023-10-15 LAB — BASIC METABOLIC PANEL
BUN/Creatinine Ratio: 12 (ref 10–24)
BUN: 15 mg/dL (ref 8–27)
CO2: 23 mmol/L (ref 20–29)
Calcium: 9.6 mg/dL (ref 8.6–10.2)
Chloride: 103 mmol/L (ref 96–106)
Creatinine, Ser: 1.22 mg/dL (ref 0.76–1.27)
Glucose: 75 mg/dL (ref 70–99)
Potassium: 3.8 mmol/L (ref 3.5–5.2)
Sodium: 143 mmol/L (ref 134–144)
eGFR: 60 mL/min/{1.73_m2} (ref >59)

## 2023-10-15 LAB — MAGNESIUM: Magnesium: 2.2 mg/dL (ref 1.6–2.3)

## 2023-10-16 DIAGNOSIS — M47816 Spondylosis without myelopathy or radiculopathy, lumbar region: Secondary | ICD-10-CM | POA: Diagnosis not present

## 2023-10-16 DIAGNOSIS — G894 Chronic pain syndrome: Secondary | ICD-10-CM | POA: Diagnosis not present

## 2023-10-16 DIAGNOSIS — M25551 Pain in right hip: Secondary | ICD-10-CM | POA: Diagnosis not present

## 2023-10-16 DIAGNOSIS — M533 Sacrococcygeal disorders, not elsewhere classified: Secondary | ICD-10-CM | POA: Diagnosis not present

## 2023-10-17 ENCOUNTER — Ambulatory Visit (HOSPITAL_COMMUNITY)
Admission: RE | Admit: 2023-10-17 | Discharge: 2023-10-17 | Disposition: A | Discharge status: Home / Self Care | Payer: Medicare Other | Source: Ambulatory Visit | Attending: Internal Medicine | Admitting: Internal Medicine

## 2023-10-17 DIAGNOSIS — R42 Dizziness and giddiness: Secondary | ICD-10-CM | POA: Insufficient documentation

## 2023-10-18 ENCOUNTER — Encounter: Payer: Self-pay | Admitting: Cardiology

## 2023-10-21 ENCOUNTER — Ambulatory Visit (INDEPENDENT_AMBULATORY_CARE_PROVIDER_SITE_OTHER): Payer: Medicare Other | Admitting: Family Medicine

## 2023-10-21 ENCOUNTER — Encounter: Payer: Self-pay | Admitting: Family Medicine

## 2023-10-21 VITALS — BP 126/65 | HR 78 | Temp 98.2°F | Ht 72.0 in | Wt 257.0 lb

## 2023-10-21 DIAGNOSIS — E538 Deficiency of other specified B group vitamins: Secondary | ICD-10-CM

## 2023-10-21 DIAGNOSIS — Z794 Long term (current) use of insulin: Secondary | ICD-10-CM | POA: Diagnosis not present

## 2023-10-21 DIAGNOSIS — I10 Essential (primary) hypertension: Secondary | ICD-10-CM | POA: Diagnosis not present

## 2023-10-21 DIAGNOSIS — E785 Hyperlipidemia, unspecified: Secondary | ICD-10-CM

## 2023-10-21 DIAGNOSIS — E1169 Type 2 diabetes mellitus with other specified complication: Secondary | ICD-10-CM

## 2023-10-21 DIAGNOSIS — E11319 Type 2 diabetes mellitus with unspecified diabetic retinopathy without macular edema: Secondary | ICD-10-CM | POA: Diagnosis not present

## 2023-10-21 LAB — MICROALBUMIN / CREATININE URINE RATIO
Creatinine,U: 119.9 mg/dL
Microalb Creat Ratio: 0.8 mg/g (ref 0.0–30.0)
Microalb, Ur: 0.9 mg/dL (ref 0.0–1.9)

## 2023-10-21 MED ORDER — CYANOCOBALAMIN 1000 MCG/ML IJ SOLN
1000.0000 ug | Freq: Once | INTRAMUSCULAR | Status: AC
  Administered 2023-10-21: 1000 ug via INTRAMUSCULAR

## 2023-10-21 NOTE — Patient Instructions (Addendum)
Urine today  Please stop by lab before you go If you have mychart- we will send your results within 3 business days of [place] receiving them.  If you do not have mychart- we will call you about results within 5 business days of [place] receiving them.  *please also note that you will see labs on mychart as soon as they post. I will later go in and write notes on them- will say "notes from Dr. Durene Cal"   Recommended follow up: Return in about 14 weeks (around 01/27/2024) for followup or sooner if needed.Schedule b4 you leave.

## 2023-10-21 NOTE — Progress Notes (Signed)
Phone [phone] In person visit   Subjective:   [name] is a 82 y.o. 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    Priority: High   Chronic bilateral low back pain without sciatica 12/12/2015    Priority: High   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    Priority: High   History of prostate cancer 01/24/2014    Priority: High   Type 2 diabetes mellitus 09/24/2007    Priority: High   Diabetic retinopathy 09/24/2007    Priority: High   PTSD (post-traumatic stress disorder) 07/30/2022    Priority: Medium    Macular degeneration     Priority: Medium    Hypothyroidism 06/22/2021    Priority: Medium    Vitamin D deficiency 11/01/2020    Priority: Medium    Tremor 08/11/2015    Priority: Medium    Lapband APL + HH repair 08/12/2013    Priority: Medium    Insomnia 09/21/2009    Priority: Medium    Hyperlipidemia associated with type 2 diabetes mellitus 09/24/2007    Priority: Medium    OSA on CPAP 09/24/2007    Priority: Medium    Essential hypertension 09/24/2007    Priority: Medium    Bell's palsy 05/08/2020    Priority: Low   GERD (gastroesophageal reflux disease) 12/08/2014    Priority: Low   History of colonic polyps 08/22/2008    Priority: Low   Chronic right hip pain 09/27/2019    Priority: 1.   Irregular cardiac rhythm 08/09/2023   SIRS (systemic inflammatory response syndrome) (HCC) 08/08/2023   AKI (acute kidney injury) (HCC) 08/08/2023   LBBB (left bundle branch block) 08/08/2023    Major depressive disorder 07/30/2022   Gynecomastia 12/28/2020   Sacroiliac joint dysfunction 06/29/2018   Temporomandibular joint (TMJ) pain 04/14/2017   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 myelopathy or radiculopathy 02/01/2016    Medications- reviewed and updated Current Outpatient Medications  Medication Sig Dispense Refill   amLODipine (NORVASC) 10 MG tablet Take 10 mg by mouth daily.     busPIRone (BUSPAR) 10 MG tablet Take by mouth 3 (three) times daily.     Cholecalciferol (VITAMIN D3) 50 MCG (2000 UT) TABS Take 1 tablet by mouth daily.     empagliflozin (JARDIANCE) 25 MG TABS tablet Take 1 tablet (25 mg total) by mouth daily before breakfast. ([facility]) 30 tablet    fenofibrate (TRICOR) 145 MG tablet Take 160 mg by mouth daily.     gabapentin (NEURONTIN) 300 MG capsule Take 600 mg by mouth 3 (three) times daily.     glucose blood test strip Use to test 4 times daily. E11.9 100 each 12   hydrochlorothiazide (HYDRODIURIL) 25 MG tablet Take 25 mg by mouth daily.     insulin aspart (NOVOLOG) 100 UNIT/ML injection Inject 35 Units into the skin 3 (three) times daily with meals. When sugar is elevated     insulin glargine (LANTUS) 100 UNIT/ML injection Inject 50 Units into the skin at  bedtime.     levothyroxine (SYNTHROID, LEVOTHROID) 50 MCG tablet Take 50 mcg by mouth daily before breakfast.     losartan (COZAAR) 100 MG tablet Take 100 mg by mouth daily.     mirtazapine (REMERON) 15 MG tablet Take 15 mg by mouth at bedtime.     neomycin-polymyxin b-dexamethasone (MAXITROL) 3.5-10000-0.1 SUSP Place 1 drop into both eyes 3 (three) times daily.     OVER THE COUNTER MEDICATION PreserVision for eyes     potassium chloride SA (K-DUR,KLOR-CON) 20 MEQ tablet Take 20 mEq by mouth daily.     simvastatin (ZOCOR) 80 MG tablet Take 40 mg by mouth daily at 6 PM. Take half tablet (40 mg) by mouth once daily     sucralfate (CARAFATE) 1 G tablet Take 1 g by mouth 4 (four) times daily -  with  meals and at bedtime.     terbinafine (LAMISIL) 1 % cream Apply 1 Application topically 2 (two) times daily.     traMADol (ULTRAM) 50 MG tablet Take 50 mg by mouth every 6 (six) hours as needed.     zolpidem (AMBIEN) 5 MG tablet Take 5 mg by mouth at bedtime as needed for sleep.     No current facility-administered medications for this visit.     Objective:  BP 126/65   Pulse 78   Temp 98.2 F (36.8 C)   Ht 6' (1.829 m)   Wt 257 lb (116.6 kg)   SpO2 95%   BMI 34.86 kg/m  Gen: NAD, resting comfortably CV: RRR no murmurs rubs or gallops Lungs: CTAB no crackles, wheeze, rhonchi Ext: trace edema slightly worse on right Skin: warm, dry Neuro: tremor on left hand, masked facies     Assessment and Plan   #Graduated from physical therapy and nursing at home with home health- wife has stepped and checking vitals, weight, temp, sugar, blood pressure  -wife trying to get him to exercise at home for balance- has walker but not using- encouraged him  #Parkinson's follows with Dr. Arbutus Leas with [facility] and significant tremor S: Medication: None  -Sinemet he declines -Mild cognitive impairment diagnosed 07/30/2022 by Rosann Auerbach, PhD - procedure with [facility] focused [place] of left halamus for right tremor with significant improvement January 2024 (he's right handed). Trying to get approval for left A/P: saw Dr. Arbutus Leas recently- holding off on for left hand procedure- worried about balance   #CLL- follow up with Dr. Mosetta Putt. No intervention at this time- he is not interested in treatments.   #Hyperlipidemia S: Controlled on simvastatin 40 mg-half of 80 mg tablet  l with triglycerides up to 379-currently on fenofibrate as well Lab Results  Component Value Date   CHOL 124 03/26/2023   HDL 48 03/26/2023   LDLCALC 32 03/26/2023   LDLDIRECT 55.0 04/22/2022   TRIG 221 (A) 03/26/2023   CHOLHDL 3 04/22/2022  A/P: lipids at goal- continue current medications - triglyceride(s) mildly high noted- can work on  lifestyle   #Hypertension S: Controlled on hydrochlorothiazide 25 mg, losartan 100 mg, amlodipine 10 mg (some edema in past) -propranolol for tremor lowered HR today so he stopped this BP Readings from Last 3 Encounters:  10/21/23 126/65  10/14/23 109/60  09/18/23 137/67  A/P:  stable- continue current medicines .   #Diabetes type 2 with ophthalmic complications- lifestyle meter and freestyle. A1c goal 7.5 or less. S: also sees [place] [facility] -inslulin glargin 50 units. Prior lows on higher -jardiance 12.5 mg daily- listed as full -  sounds like takes 10  - pretty much doing 35-40 and not using sliding scale  lately -average sugar in last 30 days of 164- 70% or 26% high. 1 low since last visit at 69 and corrected quickly Lab Results  Component Value Date   HGBA1C 7.1 (H) 08/19/2023   HGBA1C 6.9 03/26/2023   HGBA1C 7.9 10/14/2022   A/P: a1c at goal- we use 7.5 for him to help avoid lows and he has done very well- continue current medications    #Hypothyroidism S: Compliant with levothyroxine 50 mcg Lab Results  Component Value Date   TSH 1.90 08/19/2023  A/P: stable- continue current medicines   #B12 deficiency S: history of gastric bypass . have recommended b12 1000 mcg per day- he prefers injections  A/P: injection today- continue current medications   %  Patient with known CLL-was following with Dr. Clelia Croft.  Likely stable-update CBC with labs today. Saw DrMosetta Putt on 03/20/23   #history of prostate cancer- we will check psa yearly- low risk last visit Lab Results  Component Value Date   PSA1 <0.1 09/18/2023   PSA1 <0.1 03/20/2023   PSA 0.00 Repeated and verified X2. (L) 08/22/2022   PSA <0.010 07/26/2021   PSA <0.010 09/18/2020   #insomnia- on Remeron 15 mg, generic Ambien (increases fall risk- discussed today) 5 mg - buspirone 3x a day for anxiety as well Recommended follow up: Return in about 14 weeks (around 01/27/2024) for followup or sooner if needed.Schedule b4  you leave. Future Appointments  Date Time Provider Department Center  11/14/2023  7:40 AM Sherrie George, MD [facility] None  11/18/2023  9:45 AM [facility] NURSE [facility]  03/05/2024  8:45 AM Tat, Octaviano Batty, DO [facility] None  05/24/2024 11:15 AM [facility] ANNUAL WELLNESS VISIT 1 [facility]  09/16/2024 10:30 AM [facility] None  09/16/2024 11:00 AM Malachy Mood, MD [facility] None    Lab/Order associations:   ICD-10-CM   1. B12 deficiency  E53.8 cyanocobalamin (VITAMIN B12) injection 1,000 mcg    2. Type 2 diabetes mellitus with retinopathy of both eyes, with long-term current use of insulin, macular edema presence unspecified, unspecified retinopathy severity (HCC)  E11.319    Z79.4     3. Essential hypertension  I10     4. Hyperlipidemia associated with type 2 diabetes mellitus  E11.69    E78.5       Meds ordered this encounter  Medications   cyanocobalamin (VITAMIN B12) injection 1,000 mcg    Return precautions advised.  Tana Conch, MD

## 2023-10-23 ENCOUNTER — Encounter (HOSPITAL_COMMUNITY): Payer: Medicare Other

## 2023-11-03 ENCOUNTER — Encounter (HOSPITAL_COMMUNITY): Payer: Medicare Other

## 2023-11-13 ENCOUNTER — Encounter (INDEPENDENT_AMBULATORY_CARE_PROVIDER_SITE_OTHER): Payer: Medicare Other | Admitting: Ophthalmology

## 2023-11-14 ENCOUNTER — Encounter (INDEPENDENT_AMBULATORY_CARE_PROVIDER_SITE_OTHER): Payer: Medicare Other | Admitting: Ophthalmology

## 2023-11-14 DIAGNOSIS — I10 Essential (primary) hypertension: Secondary | ICD-10-CM | POA: Diagnosis not present

## 2023-11-14 DIAGNOSIS — H353231 Exudative age-related macular degeneration, bilateral, with active choroidal neovascularization: Secondary | ICD-10-CM

## 2023-11-14 DIAGNOSIS — H43813 Vitreous degeneration, bilateral: Secondary | ICD-10-CM | POA: Diagnosis not present

## 2023-11-14 DIAGNOSIS — H35033 Hypertensive retinopathy, bilateral: Secondary | ICD-10-CM | POA: Diagnosis not present

## 2023-11-18 ENCOUNTER — Ambulatory Visit (INDEPENDENT_AMBULATORY_CARE_PROVIDER_SITE_OTHER): Payer: Medicare Other | Admitting: *Deleted

## 2023-11-18 DIAGNOSIS — E538 Deficiency of other specified B group vitamins: Secondary | ICD-10-CM

## 2023-11-18 MED ORDER — CYANOCOBALAMIN 1000 MCG/ML IJ SOLN
1000.0000 ug | Freq: Once | INTRAMUSCULAR | Status: AC
  Administered 2023-11-18: 1000 ug via INTRAMUSCULAR

## 2023-11-18 NOTE — Progress Notes (Signed)
Patient presents for B12 injection today. Patient received her B12 injection in left Deltoid. Patient tolerated injection well.  Documentation entered in MAR in [employer].   

## 2023-12-18 ENCOUNTER — Ambulatory Visit (INDEPENDENT_AMBULATORY_CARE_PROVIDER_SITE_OTHER): Payer: Medicare Other

## 2023-12-18 DIAGNOSIS — E538 Deficiency of other specified B group vitamins: Secondary | ICD-10-CM | POA: Diagnosis not present

## 2023-12-18 MED ORDER — CYANOCOBALAMIN 1000 MCG/ML IJ SOLN
1000.0000 ug | Freq: Once | INTRAMUSCULAR | Status: AC
  Administered 2023-12-18: 1000 ug via INTRAMUSCULAR

## 2023-12-18 NOTE — Progress Notes (Signed)
Patient is in office today for a nurse visit for B12 Injection, per PCP's order. Patient Injection was given in the  Right deltoid. Patient tolerated injection well.

## 2024-01-09 ENCOUNTER — Encounter (INDEPENDENT_AMBULATORY_CARE_PROVIDER_SITE_OTHER): Payer: Medicare Other | Admitting: Ophthalmology

## 2024-01-09 DIAGNOSIS — H43813 Vitreous degeneration, bilateral: Secondary | ICD-10-CM | POA: Diagnosis not present

## 2024-01-09 DIAGNOSIS — I10 Essential (primary) hypertension: Secondary | ICD-10-CM

## 2024-01-09 DIAGNOSIS — H35033 Hypertensive retinopathy, bilateral: Secondary | ICD-10-CM

## 2024-01-09 DIAGNOSIS — H353231 Exudative age-related macular degeneration, bilateral, with active choroidal neovascularization: Secondary | ICD-10-CM

## 2024-01-13 ENCOUNTER — Encounter: Payer: Self-pay | Admitting: Family Medicine

## 2024-01-15 ENCOUNTER — Ambulatory Visit: Payer: Medicare Other

## 2024-01-15 ENCOUNTER — Encounter (INDEPENDENT_AMBULATORY_CARE_PROVIDER_SITE_OTHER): Admitting: Ophthalmology

## 2024-01-15 DIAGNOSIS — E538 Deficiency of other specified B group vitamins: Secondary | ICD-10-CM | POA: Diagnosis not present

## 2024-01-15 MED ORDER — CYANOCOBALAMIN 1000 MCG/ML IJ SOLN
1000.0000 ug | Freq: Once | INTRAMUSCULAR | Status: AC
  Administered 2024-01-15: 1000 ug via INTRAMUSCULAR

## 2024-01-15 NOTE — Progress Notes (Signed)
Patient was given the b12 injection in the left arm today. Patient tolerated injection well. Donzetta Starch, CMA

## 2024-01-28 ENCOUNTER — Encounter: Payer: Self-pay | Admitting: Family Medicine

## 2024-01-28 ENCOUNTER — Ambulatory Visit (INDEPENDENT_AMBULATORY_CARE_PROVIDER_SITE_OTHER): Payer: Medicare Other | Admitting: Family Medicine

## 2024-01-28 ENCOUNTER — Telehealth: Payer: Self-pay | Admitting: *Deleted

## 2024-01-28 VITALS — BP 138/64 | HR 76 | Temp 98.3°F | Ht 72.0 in | Wt 257.4 lb

## 2024-01-28 DIAGNOSIS — I10 Essential (primary) hypertension: Secondary | ICD-10-CM

## 2024-01-28 DIAGNOSIS — Z794 Long term (current) use of insulin: Secondary | ICD-10-CM

## 2024-01-28 DIAGNOSIS — I4892 Unspecified atrial flutter: Secondary | ICD-10-CM | POA: Insufficient documentation

## 2024-01-28 DIAGNOSIS — E1169 Type 2 diabetes mellitus with other specified complication: Secondary | ICD-10-CM | POA: Diagnosis not present

## 2024-01-28 DIAGNOSIS — E538 Deficiency of other specified B group vitamins: Secondary | ICD-10-CM | POA: Diagnosis not present

## 2024-01-28 DIAGNOSIS — E11319 Type 2 diabetes mellitus with unspecified diabetic retinopathy without macular edema: Secondary | ICD-10-CM | POA: Diagnosis not present

## 2024-01-28 DIAGNOSIS — E039 Hypothyroidism, unspecified: Secondary | ICD-10-CM | POA: Diagnosis not present

## 2024-01-28 DIAGNOSIS — E785 Hyperlipidemia, unspecified: Secondary | ICD-10-CM

## 2024-01-28 DIAGNOSIS — G20A1 Parkinson's disease without dyskinesia, without mention of fluctuations: Secondary | ICD-10-CM | POA: Diagnosis not present

## 2024-01-28 LAB — CBC WITH DIFFERENTIAL/PLATELET
Basophils Absolute: 0.2 10*3/uL — ABNORMAL HIGH (ref 0.0–0.1)
Basophils Relative: 0.3 % (ref 0.0–3.0)
Eosinophils Absolute: 0.5 10*3/uL (ref 0.0–0.7)
Eosinophils Relative: 0.7 % (ref 0.0–5.0)
HCT: 45.9 % (ref 39.0–52.0)
Hemoglobin: 14.6 g/dL (ref 13.0–17.0)
Lymphocytes Relative: 88.2 % — ABNORMAL HIGH (ref 12.0–46.0)
Lymphs Abs: 58.4 10*3/uL — ABNORMAL HIGH (ref 0.7–4.0)
MCHC: 31.9 g/dL (ref 30.0–36.0)
MCV: 86.5 fL (ref 78.0–100.0)
Monocytes Absolute: 1.3 10*3/uL — ABNORMAL HIGH (ref 0.1–1.0)
Monocytes Relative: 1.9 % — ABNORMAL LOW (ref 3.0–12.0)
Neutro Abs: 5.9 10*3/uL (ref 1.4–7.7)
Neutrophils Relative %: 8.9 % — ABNORMAL LOW (ref 43.0–77.0)
Platelets: 172 10*3/uL (ref 150.0–400.0)
RBC: 5.3 Mil/uL (ref 4.22–5.81)
RDW: 16.8 % — ABNORMAL HIGH (ref 11.5–15.5)
WBC: 66.3 10*3/uL (ref 4.0–10.5)

## 2024-01-28 LAB — COMPREHENSIVE METABOLIC PANEL
ALT: 26 U/L (ref 0–53)
AST: 30 U/L (ref 0–37)
Albumin: 4.4 g/dL (ref 3.5–5.2)
Alkaline Phosphatase: 50 U/L (ref 39–117)
BUN: 15 mg/dL (ref 6–23)
CO2: 29 meq/L (ref 19–32)
Calcium: 9.2 mg/dL (ref 8.4–10.5)
Chloride: 103 meq/L (ref 96–112)
Creatinine, Ser: 1.08 mg/dL (ref 0.40–1.50)
GFR: 64.83 mL/min (ref >60.00)
Glucose, Bld: 81 mg/dL (ref 70–99)
Potassium: 4.2 meq/L (ref 3.5–5.1)
Sodium: 142 meq/L (ref 135–145)
Total Bilirubin: 1.4 mg/dL — ABNORMAL HIGH (ref 0.2–1.2)
Total Protein: 6.7 g/dL (ref 6.0–8.3)

## 2024-01-28 LAB — VITAMIN B12: Vitamin B-12: 652 pg/mL (ref 211–911)

## 2024-01-28 LAB — HEMOGLOBIN A1C: Hgb A1c MFr Bld: 7.5 % — ABNORMAL HIGH (ref 4.6–6.5)

## 2024-01-28 LAB — TSH: TSH: 1.4 u[IU]/mL (ref 0.35–5.50)

## 2024-01-28 MED ORDER — ZOLPIDEM TARTRATE 5 MG PO TABS: 5.0000 mg | ORAL_TABLET | Freq: Every evening | ORAL | 5 refills | Status: AC | PRN

## 2024-01-28 MED ORDER — TRAMADOL HCL 50 MG PO TABS: 50.0000 mg | ORAL_TABLET | Freq: Four times a day (QID) | ORAL | 5 refills | Status: AC | PRN

## 2024-01-28 NOTE — Telephone Encounter (Signed)
 CRITICAL VALUE STICKER  CRITICAL VALUE: WBC 66.3  RECEIVER (on-site recipient of call): Demonie Kassa   DATE & TIME NOTIFIED: 2:47  MESSENGER (representative from lab): [email or web address]  MD NOTIFIED: Dr. Durene Cal  TIME OF NOTIFICATION: 2:53   RESPONSE:  Dr. Durene Cal said pt has CLL, he discussed it with pt this morning and pt is Not interested in treatment.

## 2024-01-28 NOTE — Progress Notes (Signed)
 Phone [phone] In person visit   Subjective:   [name] is a 83 y.o. 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    Priority: High   History of prostate cancer 01/24/2014    Priority: High   Type 2 diabetes mellitus 09/24/2007    Priority: High   Diabetic retinopathy 09/24/2007    Priority: High   PTSD (post-traumatic stress disorder) 07/30/2022    Priority: Medium    Macular degeneration     Priority: Medium    Hypothyroidism 06/22/2021    Priority: Medium    Vitamin D deficiency 11/01/2020    Priority: Medium    Tremor 08/11/2015    Priority: Medium    Lapband APL + HH repair 08/12/2013    Priority: Medium    Insomnia 09/21/2009    Priority: Medium    Hyperlipidemia associated with type 2 diabetes mellitus 09/24/2007    Priority: Medium    OSA on CPAP 09/24/2007    Priority: Medium    Essential hypertension 09/24/2007    Priority: Medium    Bell's palsy 05/08/2020    Priority: Low   GERD (gastroesophageal reflux disease) 12/08/2014    Priority: Low   History of colonic polyps 08/22/2008    Priority: Low   Chronic right hip pain 09/27/2019    Priority: 1.   Atrial flutter, unspecified type (HCC) 01/28/2024   Irregular cardiac rhythm 08/09/2023   SIRS (systemic inflammatory response syndrome) (HCC) 08/08/2023   AKI (acute kidney injury) (HCC) 08/08/2023   LBBB (left bundle branch block)  08/08/2023   Major depressive disorder 07/30/2022   Gynecomastia 12/28/2020   Sacroiliac joint dysfunction 06/29/2018   Temporomandibular joint (TMJ) pain 04/14/2017   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 myelopathy or radiculopathy 02/01/2016    Medications- reviewed and updated Current Outpatient Medications  Medication Sig Dispense Refill   amLODipine (NORVASC) 10 MG tablet Take 10 mg by mouth daily.     busPIRone (BUSPAR) 10 MG tablet Take by mouth 3 (three) times daily.     Cholecalciferol (VITAMIN D3) 50 MCG (2000 UT) TABS Take 1 tablet by mouth daily.     empagliflozin (JARDIANCE) 25 MG TABS tablet Take 1 tablet (25 mg total) by mouth daily before breakfast. ([facility]) 30 tablet    fenofibrate (TRICOR) 145 MG tablet Take 160 mg by mouth daily.     gabapentin (NEURONTIN) 300 MG capsule Take 600 mg by mouth 3 (three) times daily.     glucose blood test strip Use to test 4 times daily. E11.9 100 each 12   hydrochlorothiazide (HYDRODIURIL) 25 MG tablet Take 25 mg by mouth daily.     insulin aspart (NOVOLOG) 100 UNIT/ML injection Inject 35 Units into the skin 3 (three) times daily with meals. When sugar is elevated     insulin glargine (LANTUS) 100 UNIT/ML injection Inject 50 Units into the skin at bedtime.  levothyroxine (SYNTHROID, LEVOTHROID) 50 MCG tablet Take 50 mcg by mouth daily before breakfast.     losartan (COZAAR) 100 MG tablet Take 100 mg by mouth daily.     mirtazapine (REMERON) 15 MG tablet Take 15 mg by mouth at bedtime.     neomycin-polymyxin b-dexamethasone (MAXITROL) 3.5-10000-0.1 SUSP Place 1 drop into both eyes 3 (three) times daily.     OVER THE COUNTER MEDICATION PreserVision for eyes     potassium chloride SA (K-DUR,KLOR-CON) 20 MEQ tablet Take 20 mEq by mouth daily.     simvastatin (ZOCOR) 80 MG tablet Take 40 mg by mouth daily at 6 PM. Take half tablet (40 mg) by mouth once daily     sucralfate (CARAFATE) 1 G tablet Take 1 g by mouth 4 (four) times daily -   with meals and at bedtime.     terbinafine (LAMISIL) 1 % cream Apply 1 Application topically 2 (two) times daily.     traMADol (ULTRAM) 50 MG tablet Take 1 tablet (50 mg total) by mouth every 6 (six) hours as needed for moderate pain (pain score 4-6). 30 tablet 5   zolpidem (AMBIEN) 5 MG tablet Take 1 tablet (5 mg total) by mouth at bedtime as needed for sleep. 30 tablet 5   No current facility-administered medications for this visit.     Objective:  BP 138/64   Pulse 76   Temp 98.3 F (36.8 C)   Ht 6' (1.829 m)   Wt 257 lb 6.4 oz (116.8 kg)   SpO2 94%   BMI 34.91 kg/m  Gen: NAD, resting comfortably CV: RRR no murmurs rubs or gallops Lungs: CTAB no crackles, wheeze, rhonchi Ext: trace edema Skin: warm, dry    Assessment and Plan   #Cut on bottom of left coot- about 2 weeks ago. [facility] and given conservative local care. Got today at [place]. He showed me wound today and is healing quite well- conitnue current care. No more antibiotic(s) at this time needed.   #Parkinson's follows with Dr. Arbutus Leas with [facility] and significant tremor S: Medication: None -Sinemet he declines -Mild cognitive impairment diagnosed 07/30/2022 by Rosann Auerbach, PhD - procedure with [facility] focused [place] of left halamus for right tremor with significant improvement January 2024 (he's right handed). Holding off on the opposite side A/P: parkinsons overall stable- continue current medications    % Patient with known CLL-was following with Dr. Clelia Croft, now Dr. Mosetta Putt.  Check CBC today but has been very high. Not interested in treatment though Lab Results  Component Value Date   WBC 50.0 (H) 09/18/2023   HGB 14.1 09/18/2023   HCT 43.1 09/18/2023   MCV 85.5 09/18/2023   PLT 171 09/18/2023   #Hyperlipidemia S: Controlled on simvastatin 40 mg-half of 80 mg tablet.  with triglycerides up to 379-currently on fenofibrate as well Lab Results  Component Value Date   CHOL 124  03/26/2023   HDL 48 03/26/2023   LDLCALC 32 03/26/2023   LDLDIRECT 55.0 04/22/2022   TRIG 221 (A) 03/26/2023   CHOLHDL 3 04/22/2022  A/P: lipids have been at goal- not quite time for repeat   #Hypertension S: Controlled on hydrochlorothiazide 25 mg, losartan 100 mg, amlodipine 10 mg (some edema in past) Home #s: 130s at home- or lower BP Readings from Last 3 Encounters:  01/28/24 138/64  10/21/23 126/65  10/14/23 109/60  A/P:  stable- continue current medicines .   #Diabetes type 2 with  ophthalmic complications- lifestyle meter and freestyle. A1c goal 7.5 or less. S: [facility] -inslulin glargine 50 units--> 100 units. Prior lows on higher but after going back up this time its been very rare. 90 day average of 170 and no very lows- 1% low but will not show me particular #s  -jardiance 12.5 mg daily in past- has been taking whole tablet -novolog 35 units three times daily- up to 45 if big meal Lab Results  Component Value Date   HGBA1C 7.1 (H) 08/19/2023   HGBA1C 6.9 03/26/2023   HGBA1C 7.9 10/14/2022  A/P: hopefully stable- update a1c today. Continue current meds for now  - advised himmy big concern is lows and possibly going down on the glargine but thankfully lows are very rare   #Hypothyroidism S: Compliant with levothyroxine 50 mcg A/P: hopefully stable- update tsh today. Continue current meds for now   #B12 deficiency S: history of gastric bypass . have recommended b12 1000 mcg per day- he prefers injections in office- just had February 13th and does monthly Lab Results  Component Value Date   VITAMINB12 1,138 (H) 09/05/2021   A/P: likely stable- update B12 today  #history of prostate cancer- we will check psa yearly- checked last viist  Lab Results  Component Value Date   PSA1 <0.1 09/18/2023   PSA1 <0.1 03/20/2023   PSA 0.00 Repeated and verified X2. (L) 08/22/2022   PSA <0.010 07/26/2021   PSA <0.010 09/18/2020    #insomnia- on Remeron 15 mg,  generic Ambien (increases fall risk- discussed today) 5 mg - buspirone 3x a day for anxiety as well - no falls  #bilateral low back pain- works with Dr. Cherrie Distance- we prescription tramadol to help him with this - needs most mornings  Recommended follow up: Return in about 14 weeks (around 05/05/2024) for followup or sooner if needed.Schedule b4 you leave. Future Appointments  Date Time Provider Department Center  02/12/2024 10:00 AM [facility] CLINICAL SUPPORT [facility]  03/05/2024  7:40 AM Sherrie George, MD [facility] None  04/09/2024  1:20 PM Little Ishikawa, MD [facility] None  04/21/2024  3:00 PM Tat, Octaviano Batty, DO [facility] None  05/24/2024 11:15 AM [facility] ANNUAL WELLNESS VISIT 1 [facility]  09/16/2024 10:30 AM [facility] LAB [facility] None  09/16/2024 11:00 AM Malachy Mood, MD [facility] None    Lab/Order associations:   ICD-10-CM   1. Type 2 diabetes mellitus with retinopathy of both eyes, with long-term current use of insulin, macular edema presence unspecified, unspecified retinopathy severity (HCC)  E11.319 Comprehensive metabolic panel   Y78.2 CBC with Differential/Platelet    Hemoglobin A1c    2. Essential hypertension  I10     3. Hyperlipidemia associated with type 2 diabetes mellitus  E11.69    E78.5     4. Atrial flutter, unspecified type (HCC) Chronic I48.92     5. Parkinson's disease without dyskinesia, unspecified whether manifestations fluctuate (HCC) Chronic G20.A1     6. Hypothyroidism, unspecified type  E03.9 TSH    7. B12 deficiency  E53.8 Vitamin B12      Meds ordered this encounter  Medications   zolpidem (AMBIEN) 5 MG tablet    Sig: Take 1 tablet (5 mg total) by mouth at bedtime as needed for sleep.    Dispense:  30 tablet    Refill:  5   traMADol (ULTRAM) 50 MG tablet    Sig: Take 1 tablet (50 mg total) by mouth every 6 (six) hours as needed  for moderate pain (pain score 4-6).    Dispense:  30 tablet    Refill:  5    Return  precautions advised.  Tana Conch, MD

## 2024-01-28 NOTE — Patient Instructions (Addendum)
 Please stop by lab before you go If you have mychart- we will send your results within 3 business days of [place] receiving them.  If you do not have mychart- we will call you about results within 5 business days of [place] receiving them.  *please also note that you will see labs on mychart as soon as they post. I will later go in and write notes on them- will say "notes from Dr. Durene Cal"   Glad you are doing reasonably well- want to avoid any lows on sugar- anything under 70 cut that insulin down by 5 units and let me know  Recommended follow up: Return in about 14 weeks (around 05/05/2024) for followup or sooner if needed.Schedule b4 you leave.

## 2024-01-29 ENCOUNTER — Encounter: Payer: Self-pay | Admitting: Family Medicine

## 2024-01-29 NOTE — Telephone Encounter (Signed)
 Confirming message/statement from hospital that patient is not interested in workup/treatment of CLL

## 2024-01-30 ENCOUNTER — Encounter: Payer: Self-pay | Admitting: Hematology

## 2024-02-02 ENCOUNTER — Telehealth: Payer: Self-pay | Admitting: Hematology

## 2024-02-02 NOTE — Telephone Encounter (Signed)
 Patient's spouse is aware of scheduled appointment times/dates for follow up appointment

## 2024-02-03 NOTE — Assessment & Plan Note (Signed)
-  Diagnosed in 2015 after presenting with lymphocytosis and adenopathy -Stage 0, on active surveillance, has not required any treatment.

## 2024-02-03 NOTE — Assessment & Plan Note (Signed)
-  Prostate cancer diagnosed in 2015. He was found to have stage T1c, Gleason score 3+4 = 7 PSA of 6.8.  -Status post robotic prostatectomy in February 2015 with the pathology revealed a Gleason score 3+4 = 7 without any evidence of extraprostatic extension.  -Under active surveillance, no evidence of recurrence so far.  PSA has been undetectable.

## 2024-02-05 ENCOUNTER — Encounter: Payer: Self-pay | Admitting: Hematology

## 2024-02-05 ENCOUNTER — Inpatient Hospital Stay

## 2024-02-05 ENCOUNTER — Inpatient Hospital Stay (HOSPITAL_BASED_OUTPATIENT_CLINIC_OR_DEPARTMENT_OTHER): Admitting: Hematology

## 2024-02-05 ENCOUNTER — Inpatient Hospital Stay: Attending: Hematology

## 2024-02-05 VITALS — BP 130/68 | HR 88 | Temp 98.0°F | Resp 20 | Wt 254.1 lb

## 2024-02-05 DIAGNOSIS — Z85828 Personal history of other malignant neoplasm of skin: Secondary | ICD-10-CM | POA: Insufficient documentation

## 2024-02-05 DIAGNOSIS — Z79890 Hormone replacement therapy (postmenopausal): Secondary | ICD-10-CM | POA: Diagnosis not present

## 2024-02-05 DIAGNOSIS — R32 Unspecified urinary incontinence: Secondary | ICD-10-CM | POA: Diagnosis not present

## 2024-02-05 DIAGNOSIS — Z8546 Personal history of malignant neoplasm of prostate: Secondary | ICD-10-CM | POA: Insufficient documentation

## 2024-02-05 DIAGNOSIS — Z7984 Long term (current) use of oral hypoglycemic drugs: Secondary | ICD-10-CM | POA: Insufficient documentation

## 2024-02-05 DIAGNOSIS — E11319 Type 2 diabetes mellitus with unspecified diabetic retinopathy without macular edema: Secondary | ICD-10-CM | POA: Diagnosis not present

## 2024-02-05 DIAGNOSIS — C9110 Chronic lymphocytic leukemia of B-cell type not having achieved remission: Secondary | ICD-10-CM

## 2024-02-05 DIAGNOSIS — E781 Pure hyperglyceridemia: Secondary | ICD-10-CM | POA: Diagnosis not present

## 2024-02-05 DIAGNOSIS — C61 Malignant neoplasm of prostate: Secondary | ICD-10-CM | POA: Diagnosis not present

## 2024-02-05 DIAGNOSIS — R599 Enlarged lymph nodes, unspecified: Secondary | ICD-10-CM | POA: Insufficient documentation

## 2024-02-05 DIAGNOSIS — G4700 Insomnia, unspecified: Secondary | ICD-10-CM | POA: Insufficient documentation

## 2024-02-05 DIAGNOSIS — E039 Hypothyroidism, unspecified: Secondary | ICD-10-CM | POA: Diagnosis not present

## 2024-02-05 DIAGNOSIS — G20A1 Parkinson's disease without dyskinesia, without mention of fluctuations: Secondary | ICD-10-CM | POA: Insufficient documentation

## 2024-02-05 DIAGNOSIS — K219 Gastro-esophageal reflux disease without esophagitis: Secondary | ICD-10-CM | POA: Insufficient documentation

## 2024-02-05 DIAGNOSIS — Z860100 Personal history of colon polyps, unspecified: Secondary | ICD-10-CM | POA: Diagnosis not present

## 2024-02-05 DIAGNOSIS — I10 Essential (primary) hypertension: Secondary | ICD-10-CM | POA: Diagnosis not present

## 2024-02-05 DIAGNOSIS — Z85820 Personal history of malignant melanoma of skin: Secondary | ICD-10-CM | POA: Insufficient documentation

## 2024-02-05 DIAGNOSIS — K589 Irritable bowel syndrome without diarrhea: Secondary | ICD-10-CM | POA: Diagnosis not present

## 2024-02-05 LAB — CBC WITH DIFFERENTIAL (CANCER CENTER ONLY)
Abs Immature Granulocytes: 0 10*3/uL (ref 0.00–0.07)
Basophils Absolute: 0.8 10*3/uL — ABNORMAL HIGH (ref 0.0–0.1)
Basophils Relative: 1 %
Eosinophils Absolute: 0.8 10*3/uL — ABNORMAL HIGH (ref 0.0–0.5)
Eosinophils Relative: 1 %
HCT: 46.8 % (ref 39.0–52.0)
Hemoglobin: 15.2 g/dL (ref 13.0–17.0)
Lymphocytes Relative: 89 %
Lymphs Abs: 69.2 10*3/uL — ABNORMAL HIGH (ref 0.7–4.0)
MCH: 27 pg (ref 26.0–34.0)
MCHC: 32.5 g/dL (ref 30.0–36.0)
MCV: 83 fL (ref 80.0–100.0)
Monocytes Absolute: 0.8 10*3/uL (ref 0.1–1.0)
Monocytes Relative: 1 %
Neutro Abs: 6.2 10*3/uL (ref 1.7–7.7)
Neutrophils Relative %: 8 %
Platelet Count: 179 10*3/uL (ref 150–400)
RBC: 5.64 MIL/uL (ref 4.22–5.81)
RDW: 16.2 % — ABNORMAL HIGH (ref 11.5–15.5)
Smear Review: NORMAL
WBC Count: 77.8 10*3/uL (ref 4.0–10.5)
nRBC: 0 % (ref 0.0–0.2)

## 2024-02-05 LAB — COMPREHENSIVE METABOLIC PANEL
ALT: 25 U/L (ref 0–44)
AST: 23 U/L (ref 15–41)
Albumin: 4.6 g/dL (ref 3.5–5.0)
Alkaline Phosphatase: 51 U/L (ref 38–126)
Anion gap: 9 (ref 5–15)
BUN: 17 mg/dL (ref 8–23)
CO2: 25 mmol/L (ref 22–32)
Calcium: 9.2 mg/dL (ref 8.9–10.3)
Chloride: 108 mmol/L (ref 98–111)
Creatinine, Ser: 1.04 mg/dL (ref 0.61–1.24)
GFR, Estimated: >60 mL/min (ref >60)
Glucose, Bld: 104 mg/dL — ABNORMAL HIGH (ref 70–99)
Potassium: 3.9 mmol/L (ref 3.5–5.1)
Sodium: 142 mmol/L (ref 135–145)
Total Bilirubin: 1.1 mg/dL (ref 0.0–1.2)
Total Protein: 6.6 g/dL (ref 6.5–8.1)

## 2024-02-05 NOTE — Progress Notes (Signed)
 Critical lab value reported:  WBC 77.8  Dr. Mosetta Putt made aware of critical lab value.

## 2024-02-05 NOTE — Progress Notes (Signed)
 [facility]   Telephone:([phone]) [phone] [phone]   Clinic Follow up Note   Patient Care Team: Shelva Majestic, MD as PCP - General (Family Medicine) Sherrie George, MD as Consulting Physician (Ophthalmology) Janalyn Harder, MD (Inactive) as Consulting Physician (Dermatology) Erroll Luna, [facility] (Inactive) (Pharmacist)  Date of Service:  02/05/2024  CHIEF COMPLAINT: f/u of CLL  CURRENT THERAPY:  Observation  Oncology History   Chronic lymphocytic leukemia (HCC) -Diagnosed in 2015 after presenting with lymphocytosis and adenopathy -Stage 0, on active surveillance, has not required any treatment.  History of prostate cancer -Prostate cancer diagnosed in 2015. He was found to have stage T1c, Gleason score 3+4 = 7 PSA of 6.8.  -Status post robotic prostatectomy in February 2015 with the pathology revealed a Gleason score 3+4 = 7 without any evidence of extraprostatic extension.  -Under active surveillance, no evidence of recurrence so far.  PSA has been undetectable.   Assessment and Plan    Chronic Lymphocytic Leukemia (CLL) CLL with increasing lymphocyte count from 36 in October 2024 to 77 today. Reports intermittent weakness and occasional night sweats, but no significant weight loss, fever, or severe night sweats.  No anemia or thrombocytopenia.  No palpable lymphadenopathy.  -His episode lymphocyte count has doubled in the past 6 months, discussed potential need for treatment if lymphocyte count continues to rise. Explained treatment options, including oral medications and IV chemo, and the importance of monitoring blood counts. Discussed blood test for prognostic factors and mutations to guide treatment decisions. - Order CLL FISH panel - Schedule follow-up in six weeks - Consider starting oral BTK inhibitor if lymphocyte count rises significantly -Patient declined a CT scan.  Parkinson's Disease Diagnosed approximately five and a half years  ago. Experiences tremors and requires assistance with daily activities such as buttoning clothes. No recent falls reported.  Diabetes Mellitus Managed with insulin injections four times daily. Recent blood sugar reading was 88. Hemoglobin A1c is 7.5.  Hypertension Hypertension. No specific details on current management or recent blood pressure readings provided.  Urinary Incontinence Requires wearing a diaper due to urinary incontinence. No further details provided.  Skin Melanoma Melanoma surgically removed approximately three to four years ago. Concern about potential melanoma in the lungs, but PET scan in July 2022 was negative. No current symptoms warranting a full-body scan. Declined CT scan for lung melanoma.  General Health Maintenance No specific general health maintenance issues discussed beyond the management of chronic conditions.   Plan -Lab reviewed, will obtain CLL FISH panel, he will likely need treatment in the near future. - Schedule follow-up in six weeks       Discussed the use of AI scribe software for clinical note transcription with the patient, who gave verbal consent to proceed.  History of Present Illness   The patient, with a known history of CLL, Parkinson's disease, hypertension, hyperlipidemia, diabetes, and a previous melanoma, presents with a general feeling of weakness. The weakness has been gradually worsening, affecting the patient's balance and necessitating the use of a walker. The patient's caregiver assists with daily activities such as buttoning clothes due to the patient's tremors from Parkinson's disease. The patient denies any recent falls.  The patient also has a history of urinary incontinence, requiring the use of a diaper. The caregiver reports occasional night sweats for the patient, but not to the extent of needing to change clothes. The patient denies any recent fever or weight loss. There is a concern about possible  melanoma in the  lungs, but the patient declines further imaging studies.  The patient's CLL has been monitored with regular blood work. The most recent results show an increase in lymphocyte count, which has been steadily rising over the past few months. The patient expresses a desire for oral treatment if necessary.         All other systems were reviewed with the patient and are negative.  MEDICAL HISTORY:  Past Medical History:  Diagnosis Date   Arthritis    Basal cell carcinoma 03/18/2017   left neck CX3 5FU   Bell's palsy 05/08/2020   Cataract    Chronic bilateral low back pain without sciatica 12/12/2015   Chronic lymphocytic leukemia 05/16/2014   Oncology q6 months. Thought to be agent orange related-CLL and prostate cancer  Diagnosis 1. 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 - NO EVIDENCE OF EXTRAPROSTATIC EXTENSION, ANGIOLYMPHATIC INVASION OR SEMINAL VESICLE INVOLVEMENT IDENTIFIED. - RESECTION MARGINS, NEGATIVE FOR ATYPIA OR MALIGNANCY. 2. Lymph nodes, regional resection, righ   Chronic pain syndrome 02/01/2016   Chronic right hip pain 09/27/2019   DDD (degenerative disc disease), lumbar    Diabetic retinopathy 09/24/2007   Diverticulosis of colon    Elevated PSA    Erectile dysfunction 12/30/2007   No rx.      GERD (gastroesophageal reflux disease)    Gynecomastia 12/28/2020   History of colonic polyps 08/22/2008   2004 3 adenomas 2009 none 2013 none 02/26/2017 8 mm sigmoid polyp was inflammatory - no recall planned given age/co-morbidities overall hx of polyps     History of prostate cancer 01/24/2014   Follows with Dr. Wilson Singer every 3 months. Dr. Clelia Croft every 6 months.  Diagnosis 1. 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 - NO EVIDENCE OF EXTRAPROSTATIC EXTENSION, ANGIOLYMPHATIC INVASION OR SEMINAL VESICLE INVOLVEMENT IDENTIFIED. - RESECTION MARGINS, NEGATIVE FOR ATYPIA  OR MALIGNANCY. 2. Lymph nodes, regional resection, right pelvic - SIX   Hyperlipidemia associated with type 2 diabetes mellitus 09/24/2007   High triglycerides. Refused statin due to myalgias in past but then started half tablet of simvastatin 40mg  through [facility] of this note might be different from the original. Formatting of this note might be different from the original. High triglycerides. Refused statin due to myalgias in past but then started half tablet of simvastatin 40mg  through [place]  Last Assessment & Plan:  Formatting    Hypertension associated with diabetes 09/24/2007   Amlodipine 10mg , losartan 100mg , HCTZ 25mg , propranolol 40 mg twice a day Home cuff 142/85 compared to 134/72 on my check. Likely home cuff about 10 points higher.   --> chlorthalidone 03/20/16 but patient didn't change and then BP controlled on next visit   Formatting of this note might be different from the original. Formatting of this note might be different from the original. 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  Follow up in 6 months unless wound issues arise.  Derm follow up in October 2022 scheduled.  I will see him back in 6 months.   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 a1c under 8     Formatting of this note might be different from the original. Formatting of this note might be different from the original. And macular degeneration.   Last Assessment & Plan:  Formatting of this note might be different from the original. S: diabetic retinopathy followed up by optho yesterday and largely stable. He also has m   Vitamin D deficiency 11/01/2020   At [facility]- on 2000 units a day at least since 2021     SURGICAL HISTORY: Past Surgical History:  Procedure Laterality Date   CHOLECYSTECTOMY     COLONOSCOPY  03/25/12, 09/28/08   ESOPHAGOGASTRODUODENOSCOPY N/A 10/12/2014   Procedure: ESOPHAGOGASTRODUODENOSCOPY (EGD);  Surgeon: Iva Boop, MD;  Location: [facility];  Service: Endoscopy;  Laterality: N/A;   ESOPHAGOGASTRODUODENOSCOPY ENDOSCOPY  09/28/08   EXCISION MELANOMA WITH SENTINEL LYMPH NODE BIOPSY Right 05/31/2021   Procedure: WIDE LOCAL EXCISION RIGHT FOREARM WITH ADVANCEDMENT FLAP CLOSURE WITH SENTINEL LYMPH NODE MAPPING AND BIOSPY;  Surgeon: Almond Lint, MD;  Location: [facility];  Service: General;  Laterality: Right;   FOOT SURGERY Bilateral    HEMORRHOID SURGERY     LAPAROSCOPIC GASTRIC BANDING  03/05/11   weight loss   LAPAROSCOPIC GASTRIC BANDING WITH HIATAL HERNIA REPAIR  03/05/2011   LYMPHADENECTOMY Bilateral 01/24/2014   Procedure: LYMPHADENECTOMY;  Surgeon: Crecencio Mc, MD;  Location: [facility];  Service: Urology;  Laterality: Bilateral;   ORIF TIBIA FRACTURE Right    PENILE PROSTHESIS IMPLANT     PROSTATE SURGERY  01/2014   ROBOT ASSISTED LAPAROSCOPIC RADICAL PROSTATECTOMY N/A 01/24/2014   Procedure: ROBOTIC ASSISTED LAPAROSCOPIC RADICAL PROSTATECTOMY LEVEL 3;  Surgeon: Crecencio Mc, MD;  Location: [facility];  Service: Urology;  Laterality: N/A;   SHOULDER SURGERY Right 2011   TONSILLECTOMY     age 83   VASECTOMY      I have reviewed the social history and family  history with the patient and they are unchanged from previous note.  ALLERGIES:  is allergic to morphine sulfate.  MEDICATIONS:  Current Outpatient Medications  Medication Sig Dispense Refill   amLODipine (NORVASC) 10 MG tablet Take 10 mg by mouth daily.     busPIRone (BUSPAR) 10 MG tablet Take by mouth 3 (three) times daily.     Cholecalciferol (VITAMIN D3) 50 MCG (2000 UT) TABS Take 1 tablet by mouth daily.     empagliflozin (JARDIANCE) 25 MG TABS tablet Take 1 tablet (25 mg total) by mouth daily before breakfast. (GIVEN BY [facility]) 30 tablet    fenofibrate (TRICOR) 145 MG tablet Take 160 mg by mouth daily.     gabapentin (NEURONTIN) 300 MG capsule Take 600 mg by mouth 3 (three) times daily.     glucose blood test strip Use to test 4 times daily. E11.9 100 each 12   hydrochlorothiazide (HYDRODIURIL) 25 MG tablet Take 25 mg by mouth daily.     insulin aspart (NOVOLOG) 100 UNIT/ML injection Inject 35 Units into the skin 3 (  three) times daily with meals. When sugar is elevated     insulin glargine (LANTUS) 100 UNIT/ML injection Inject 50 Units into the skin at bedtime.     levothyroxine (SYNTHROID, LEVOTHROID) 50 MCG tablet Take 50 mcg by mouth daily before breakfast.     losartan (COZAAR) 100 MG tablet Take 100 mg by mouth daily.     mirtazapine (REMERON) 15 MG tablet Take 15 mg by mouth at bedtime.     neomycin-polymyxin b-dexamethasone (MAXITROL) 3.5-10000-0.1 SUSP Place 1 drop into both eyes 3 (three) times daily.     OVER THE COUNTER MEDICATION PreserVision for eyes     potassium chloride SA (K-DUR,KLOR-CON) 20 MEQ tablet Take 20 mEq by mouth daily.     simvastatin (ZOCOR) 80 MG tablet Take 40 mg by mouth daily at 6 PM. Take half tablet (40 mg) by mouth once daily     sucralfate (CARAFATE) 1 G tablet Take 1 g by mouth 4 (four) times daily -  with meals and at bedtime.     terbinafine (LAMISIL) 1 % cream Apply 1 Application topically 2 (two) times daily.     traMADol (ULTRAM) 50 MG tablet  Take 1 tablet (50 mg total) by mouth every 6 (six) hours as needed for moderate pain (pain score 4-6). 30 tablet 5   zolpidem (AMBIEN) 5 MG tablet Take 1 tablet (5 mg total) by mouth at bedtime as needed for sleep. 30 tablet 5   No current facility-administered medications for this visit.    PHYSICAL EXAMINATION: ECOG PERFORMANCE STATUS: 2 - Symptomatic, <50% confined to bed  Vitals:   02/05/24 0809  BP: 130/68  Pulse: 88  Resp: 20  Temp: 98 F (36.7 C)  SpO2: 96%   Wt Readings from Last 3 Encounters:  02/05/24 254 lb 1.6 oz (115.3 kg)  01/28/24 257 lb 6.4 oz (116.8 kg)  10/21/23 257 lb (116.6 kg)     GENERAL:alert, no distress and comfortable SKIN: skin color, texture, turgor are normal, no rashes or significant lesions EYES: normal, Conjunctiva are pink and non-injected, sclera clear NECK: supple, thyroid normal size, non-tender, without nodularity LYMPH:  no palpable lymphadenopathy in the cervical, axillary  LUNGS: clear to auscultation and percussion with normal breathing effort HEART: regular rate & rhythm and no murmurs and no lower extremity edema ABDOMEN:abdomen soft, non-tender and normal bowel sounds, no organomegaly. Musculoskeletal:no cyanosis of digits and no clubbing  NEURO: alert & oriented x 3 with fluent speech, no focal motor/sensory deficits   LABORATORY DATA:  I have reviewed the data as listed    Latest Ref Rng & Units 02/05/2024    7:45 AM 01/28/2024    9:24 AM 09/18/2023   11:54 AM  CBC  WBC 4.0 - 10.5 K/uL 77.8  66.3 Repeated and verified X2.  50.0   Hemoglobin 13.0 - 17.0 g/dL 24.4  01.0  27.2   Hematocrit 39.0 - 52.0 % 46.8  45.9  43.1   Platelets 150 - 400 K/uL 179  172.0  171         Latest Ref Rng & Units 02/05/2024    7:45 AM 01/28/2024    9:24 AM 10/14/2023    2:54 PM  CMP  Glucose 70 - 99 mg/dL 536  81  75   BUN 8 - 23 mg/dL 17  15  15    Creatinine 0.61 - 1.24 mg/dL 6.44  0.34  7.42   Sodium 135 - 145 mmol/L 142  142  143  Potassium 3.5 - 5.1 mmol/L 3.9  4.2  3.8   Chloride 98 - 111 mmol/L 108  103  103   CO2 22 - 32 mmol/L 25  29  23    Calcium 8.9 - 10.3 mg/dL 9.2  9.2  9.6   Total Protein 6.5 - 8.1 g/dL 6.6  6.7    Total Bilirubin 0.0 - 1.2 mg/dL 1.1  1.4    Alkaline Phos 38 - 126 U/L 51  50    AST 15 - 41 U/L 23  30    ALT 0 - 44 U/L 25  26        RADIOGRAPHIC STUDIES: I have personally reviewed the radiological images as listed and agreed with the findings in the report. No results found.    Orders Placed This Encounter  Procedures   FISH CLL Leukemia    Standing Status:   Future    Number of Occurrences:   1    Expiration Date:   02/04/2025   All questions were answered. The patient knows to call the clinic with any problems, questions or concerns. No barriers to learning was detected. The total time spent in the appointment was 30 minutes.     Malachy Mood, MD 02/05/2024

## 2024-02-06 LAB — PROSTATE-SPECIFIC AG, SERUM (LABCORP): Prostate Specific Ag, Serum: <0.1 ng/mL (ref 0.0–4.0)

## 2024-02-10 ENCOUNTER — Other Ambulatory Visit

## 2024-02-10 ENCOUNTER — Ambulatory Visit: Admitting: Hematology

## 2024-02-10 LAB — FISH HES LEUKEMIA, 4Q12 REA

## 2024-02-12 ENCOUNTER — Ambulatory Visit (INDEPENDENT_AMBULATORY_CARE_PROVIDER_SITE_OTHER): Payer: Medicare Other

## 2024-02-12 DIAGNOSIS — E538 Deficiency of other specified B group vitamins: Secondary | ICD-10-CM | POA: Diagnosis not present

## 2024-02-12 MED ORDER — CYANOCOBALAMIN 1000 MCG/ML IJ SOLN
1000.0000 ug | Freq: Once | INTRAMUSCULAR | Status: AC
Start: 2024-02-12 — End: 2024-02-12
  Administered 2024-02-12: 1000 ug via INTRAMUSCULAR

## 2024-02-12 NOTE — Progress Notes (Signed)
 Patient was given a b12 injection in the left arm today. Patient tolerated injection well. Donzetta Starch, CMA

## 2024-03-05 ENCOUNTER — Encounter (INDEPENDENT_AMBULATORY_CARE_PROVIDER_SITE_OTHER): Payer: Medicare Other | Admitting: Ophthalmology

## 2024-03-05 ENCOUNTER — Ambulatory Visit: Payer: Medicare Other | Admitting: Neurology

## 2024-03-05 DIAGNOSIS — H35033 Hypertensive retinopathy, bilateral: Secondary | ICD-10-CM | POA: Diagnosis not present

## 2024-03-05 DIAGNOSIS — H353231 Exudative age-related macular degeneration, bilateral, with active choroidal neovascularization: Secondary | ICD-10-CM | POA: Diagnosis not present

## 2024-03-05 DIAGNOSIS — H43813 Vitreous degeneration, bilateral: Secondary | ICD-10-CM

## 2024-03-05 DIAGNOSIS — I10 Essential (primary) hypertension: Secondary | ICD-10-CM

## 2024-03-11 ENCOUNTER — Ambulatory Visit

## 2024-03-17 NOTE — Assessment & Plan Note (Addendum)
-  Diagnosed in 2015 after presenting with lymphocytosis and adenopathy -Stage 0, on active surveillance, has not required any treatment. 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 Results for CCND1/IGH, ATM, chromosome 12, and TP53 were normal.

## 2024-03-18 ENCOUNTER — Inpatient Hospital Stay (HOSPITAL_BASED_OUTPATIENT_CLINIC_OR_DEPARTMENT_OTHER): Admitting: Hematology

## 2024-03-18 ENCOUNTER — Other Ambulatory Visit: Payer: Self-pay

## 2024-03-18 ENCOUNTER — Inpatient Hospital Stay: Attending: Hematology

## 2024-03-18 ENCOUNTER — Ambulatory Visit (INDEPENDENT_AMBULATORY_CARE_PROVIDER_SITE_OTHER): Admitting: *Deleted

## 2024-03-18 VITALS — BP 160/84 | HR 65 | Temp 97.6°F | Resp 17 | Wt 257.4 lb

## 2024-03-18 DIAGNOSIS — G20A1 Parkinson's disease without dyskinesia, without mention of fluctuations: Secondary | ICD-10-CM | POA: Insufficient documentation

## 2024-03-18 DIAGNOSIS — E119 Type 2 diabetes mellitus without complications: Secondary | ICD-10-CM | POA: Insufficient documentation

## 2024-03-18 DIAGNOSIS — F32A Depression, unspecified: Secondary | ICD-10-CM | POA: Diagnosis not present

## 2024-03-18 DIAGNOSIS — C9110 Chronic lymphocytic leukemia of B-cell type not having achieved remission: Secondary | ICD-10-CM | POA: Insufficient documentation

## 2024-03-18 DIAGNOSIS — Z8546 Personal history of malignant neoplasm of prostate: Secondary | ICD-10-CM | POA: Insufficient documentation

## 2024-03-18 DIAGNOSIS — I4891 Unspecified atrial fibrillation: Secondary | ICD-10-CM | POA: Diagnosis not present

## 2024-03-18 DIAGNOSIS — E538 Deficiency of other specified B group vitamins: Secondary | ICD-10-CM | POA: Diagnosis not present

## 2024-03-18 LAB — CBC WITH DIFFERENTIAL (CANCER CENTER ONLY)
Abs Immature Granulocytes: 0 10*3/uL (ref 0.00–0.07)
Basophils Absolute: 0 10*3/uL (ref 0.0–0.1)
Basophils Relative: 0 %
Eosinophils Absolute: 1.5 10*3/uL — ABNORMAL HIGH (ref 0.0–0.5)
Eosinophils Relative: 2 %
HCT: 41.4 % (ref 39.0–52.0)
Hemoglobin: 13.7 g/dL (ref 13.0–17.0)
Lymphocytes Relative: 93 %
Lymphs Abs: 70.1 10*3/uL — ABNORMAL HIGH (ref 0.7–4.0)
MCH: 27.6 pg (ref 26.0–34.0)
MCHC: 33.1 g/dL (ref 30.0–36.0)
MCV: 83.5 fL (ref 80.0–100.0)
Monocytes Absolute: 0.8 10*3/uL (ref 0.1–1.0)
Monocytes Relative: 1 %
Neutro Abs: 3 10*3/uL (ref 1.7–7.7)
Neutrophils Relative %: 4 %
Platelet Count: 137 10*3/uL — ABNORMAL LOW (ref 150–400)
RBC: 4.96 MIL/uL (ref 4.22–5.81)
RDW: 15.5 % (ref 11.5–15.5)
Smear Review: NORMAL
WBC Count: 75.4 10*3/uL (ref 4.0–10.5)
nRBC: 0 % (ref 0.0–0.2)

## 2024-03-18 LAB — COMPREHENSIVE METABOLIC PANEL WITH GFR
ALT: 17 U/L (ref 0–44)
AST: 16 U/L (ref 15–41)
Albumin: 4.3 g/dL (ref 3.5–5.0)
Alkaline Phosphatase: 52 U/L (ref 38–126)
Anion gap: 10 (ref 5–15)
BUN: 17 mg/dL (ref 8–23)
CO2: 27 mmol/L (ref 22–32)
Calcium: 8.8 mg/dL — ABNORMAL LOW (ref 8.9–10.3)
Chloride: 105 mmol/L (ref 98–111)
Creatinine, Ser: 1.15 mg/dL (ref 0.61–1.24)
GFR, Estimated: >60 mL/min (ref >60)
Glucose, Bld: 264 mg/dL — ABNORMAL HIGH (ref 70–99)
Potassium: 3.7 mmol/L (ref 3.5–5.1)
Sodium: 142 mmol/L (ref 135–145)
Total Bilirubin: 1 mg/dL (ref 0.0–1.2)
Total Protein: 6.2 g/dL — ABNORMAL LOW (ref 6.5–8.1)

## 2024-03-18 MED ORDER — CYANOCOBALAMIN 1000 MCG/ML IJ SOLN
1000.0000 ug | Freq: Once | INTRAMUSCULAR | Status: AC
  Administered 2024-03-18: 1000 ug via INTRAMUSCULAR

## 2024-03-18 NOTE — Progress Notes (Signed)
 Patient presents for B12 injection today. Patient received her B12 injection in Left Deltoid. Patient tolerated injection well.  Documentation entered in [facility] in [employer].

## 2024-03-18 NOTE — Progress Notes (Signed)
 [facility]   [phone] Fax:([phone]   Clinic Follow up Note   Patient Care Team: Shelva Majestic, MD as PCP - General (Family Medicine) Sherrie George, MD as Consulting Physician (Ophthalmology) Janalyn Harder, MD (Inactive) as Consulting Physician (Dermatology) Erroll Luna, [facility] (Inactive) (Pharmacist)  Date of Service:  03/18/2024  CHIEF COMPLAINT: f/u of CLL  CURRENT THERAPY:  Observation  Oncology History   Chronic lymphocytic leukemia (HCC) -Diagnosed in 2015 after presenting with lymphocytosis and adenopathy -Stage 0, on active surveillance, has not required any treatment. 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 Results for CCND1/IGH, ATM, chromosome 12, and TP53 were normal.    Assessment & Plan Chronic Lymphocytic Leukemia (CLL) CLL is well-managed with a lymphocyte count of 75, consistent with the previous count of 77. No anemia, thrombocytopenia, or significant weight loss. Occasional night sweats are reported but inconsistent. Treatment initiation is borderline due to lack of significant symptoms or cytopenias. Discussed CT scan to evaluate for lymphadenopathy or splenomegaly, which would indicate treatment need. He is hesitant about CT scan but open if necessary. Treatment involves an oral agent, not chemotherapy, to control but not cure the disease. If CT scan shows lymphadenopathy or splenomegaly, or if cytopenias worsen, treatment will be considered. - Order CT scan of chest, abdomen, and pelvis to evaluate for lymphadenopathy or splenomegaly - Monitor blood counts closely - Schedule follow-up appointment in 3 months - Consider starting oral agent Brukinsa if CT scan shows bulky lymphadenopathy or splenomegaly or if his ALC increase rapidly  Parkinson's  Disease Parkinson's disease contributes to tremors. Tremors are a consideration for the CT scan, but reassured it will not affect results as the focus is on the chest, abdomen, and pelvis.  Type 2 Diabetes Mellitus Managed with insulin therapy, four injections daily. No current issues discussed.  Atrial Fibrillation Atrial fibrillation is present. No current symptoms or management changes discussed.  Depression Depression is managed with medication. No current symptoms or management changes discussed.  Plan - Lab reviewed, absolute lymphocyte count is similar to 6 weeks ago -I ordered a CT chest, abdomen and pelvis to evaluate adenopathy and splenomegaly - We decided to hold treatment for now due to his medical comorbidities, and continue to monitor closely. - Lab in 6 weeks, lab and follow-up with me in 3 months   Discussed the use of AI scribe software for clinical note transcription with the patient, who gave verbal consent to proceed.  History of Present Illness The patient, an 83 year old with a history of CLL, Parkinson's disease, atrial fibrillation, acid reflux, thyroid issues, macular degeneration, depression, sleep apnea, and type 2 diabetes, presents for a follow-up visit. The patient's lymphocyte count has been stable since the last visit, and there are no new symptoms reported. The patient's family member reports that the patient has good and bad days, but the patient does not discuss what bothers him on his bad days. The patient also experiences occasional night sweats, which may or may not be related to a heating unit in the patient's home. The patient is on multiple medications, including tramadol for back and hip pain, which may be causing constipation, and gabapentin for neuropathic pain. The patient also takes insulin for diabetes and is on medication for depression.     All other systems were reviewed with the  patient and are negative.  MEDICAL HISTORY:  Past Medical  History:  Diagnosis Date   Arthritis    Basal cell carcinoma 03/18/2017   left neck CX3 5FU   Bell's palsy 05/08/2020   Cataract    Chronic bilateral low back pain without sciatica 12/12/2015   Chronic lymphocytic leukemia 05/16/2014   Oncology q6 months. Thought to be agent orange related-CLL and prostate cancer  Diagnosis 1. 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 - NO EVIDENCE OF EXTRAPROSTATIC EXTENSION, ANGIOLYMPHATIC INVASION OR SEMINAL VESICLE INVOLVEMENT IDENTIFIED. - RESECTION MARGINS, NEGATIVE FOR ATYPIA OR MALIGNANCY. 2. Lymph nodes, regional resection, righ   Chronic pain syndrome 02/01/2016   Chronic right hip pain 09/27/2019   DDD (degenerative disc disease), lumbar    Diabetic retinopathy 09/24/2007   Diverticulosis of colon    Elevated PSA    Erectile dysfunction 12/30/2007   No rx.      GERD (gastroesophageal reflux disease)    Gynecomastia 12/28/2020   History of colonic polyps 08/22/2008   2004 3 adenomas 2009 none 2013 none 02/26/2017 8 mm sigmoid polyp was inflammatory - no recall planned given age/co-morbidities overall hx of polyps     History of prostate cancer 01/24/2014   Follows with Dr. Wilson Singer every 3 months. Dr. Clelia Croft every 6 months.  Diagnosis 1. 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 - NO EVIDENCE OF EXTRAPROSTATIC EXTENSION, ANGIOLYMPHATIC INVASION OR SEMINAL VESICLE INVOLVEMENT IDENTIFIED. - RESECTION MARGINS, NEGATIVE FOR ATYPIA OR MALIGNANCY. 2. Lymph nodes, regional resection, right pelvic - SIX   Hyperlipidemia associated with type 2 diabetes mellitus 09/24/2007   High triglycerides. Refused statin due to myalgias in past but then started half tablet of simvastatin 40mg  through [facility] of this note might be different from the original. Formatting of this note might be different from the original. High triglycerides. Refused  statin due to myalgias in past but then started half tablet of simvastatin 40mg  through [place]  Last Assessment & Plan:  Formatting    Hypertension associated with diabetes 09/24/2007   Amlodipine 10mg , losartan 100mg , HCTZ 25mg , propranolol 40 mg twice a day Home cuff 142/85 compared to 134/72 on my check. Likely home cuff about 10 points higher.   --> chlorthalidone 03/20/16 but patient didn't change and then BP controlled on next visit   Formatting of this note might be different from the original. Formatting of this note might be different from the original. 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  Follow up in 6 months unless wound issues arise.  Derm follow up in October 2022 scheduled.  I will see him back in 6 months.   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 a1c under 8     Formatting of this note might be different from the original. Formatting of this note might be different from the original. And macular degeneration.   Last Assessment & Plan:  Formatting of this note might be different from the original. S: diabetic retinopathy followed up by optho yesterday and largely stable. He also has  m   Vitamin D deficiency 11/01/2020   At [facility]- on 2000 units a day at least since 2021     SURGICAL HISTORY: Past Surgical History:  Procedure Laterality Date   CHOLECYSTECTOMY     COLONOSCOPY  03/25/12, 09/28/08   ESOPHAGOGASTRODUODENOSCOPY N/A 10/12/2014   Procedure: ESOPHAGOGASTRODUODENOSCOPY (EGD);  Surgeon: Kenney Peacemaker, MD;  Location: [facility];  Service: Endoscopy;  Laterality: N/A;   ESOPHAGOGASTRODUODENOSCOPY ENDOSCOPY  09/28/08   EXCISION MELANOMA WITH SENTINEL LYMPH NODE BIOPSY Right 05/31/2021   Procedure: WIDE LOCAL EXCISION RIGHT FOREARM WITH ADVANCEDMENT FLAP CLOSURE WITH SENTINEL LYMPH NODE MAPPING AND BIOSPY;  Surgeon: Lockie Rima, MD;  Location: [facility];  Service: General;  Laterality: Right;   FOOT SURGERY Bilateral    HEMORRHOID SURGERY     LAPAROSCOPIC GASTRIC BANDING  03/05/11   weight loss   LAPAROSCOPIC GASTRIC BANDING WITH HIATAL HERNIA REPAIR  03/05/2011   LYMPHADENECTOMY Bilateral 01/24/2014   Procedure: LYMPHADENECTOMY;  Surgeon: Kristeen Peto, MD;  Location: [facility];  Service: Urology;  Laterality: Bilateral;   ORIF TIBIA FRACTURE Right    PENILE PROSTHESIS IMPLANT     PROSTATE SURGERY  01/2014   ROBOT ASSISTED LAPAROSCOPIC RADICAL PROSTATECTOMY N/A 01/24/2014   Procedure: ROBOTIC ASSISTED LAPAROSCOPIC RADICAL PROSTATECTOMY LEVEL 3;  Surgeon: Kristeen Peto, MD;  Location: [facility];  Service: Urology;  Laterality: N/A;   SHOULDER SURGERY Right 2011   TONSILLECTOMY     age 86   VASECTOMY      I have reviewed the social history and family history with the patient and they are unchanged from previous note.  ALLERGIES:  is allergic to morphine sulfate.  MEDICATIONS:  Current Outpatient Medications  Medication Sig Dispense Refill   amLODipine (NORVASC) 10 MG tablet Take 10 mg by mouth daily.     busPIRone (BUSPAR) 10 MG tablet Take by mouth 3 (three) times daily.     Cholecalciferol (VITAMIN D3) 50 MCG (2000 UT) TABS Take 1 tablet by mouth daily.      empagliflozin (JARDIANCE) 25 MG TABS tablet Take 1 tablet (25 mg total) by mouth daily before breakfast. (GIVEN BY [facility]) 30 tablet    fenofibrate (TRICOR) 145 MG tablet Take 160 mg by mouth daily.     gabapentin (NEURONTIN) 300 MG capsule Take 600 mg by mouth 3 (three) times daily.     glucose blood test strip Use to test 4 times daily. E11.9 100 each 12   hydrochlorothiazide (HYDRODIURIL) 25 MG tablet Take 25 mg by mouth daily.     insulin aspart (NOVOLOG) 100 UNIT/ML injection Inject 35 Units into the skin 3 (three) times daily with meals. When sugar is elevated     insulin glargine (LANTUS) 100 UNIT/ML injection Inject 50 Units into the skin at bedtime.     levothyroxine (SYNTHROID, LEVOTHROID) 50 MCG tablet Take 50 mcg by mouth daily before breakfast.     losartan (COZAAR) 100 MG tablet Take 100 mg by mouth daily.     mirtazapine (REMERON) 15 MG tablet  Take 15 mg by mouth at bedtime.     neomycin-polymyxin b-dexamethasone (MAXITROL) 3.5-10000-0.1 SUSP Place 1 drop into both eyes 3 (three) times daily.     OVER THE COUNTER MEDICATION PreserVision for eyes     potassium chloride SA (K-DUR,KLOR-CON) 20 MEQ tablet Take 20 mEq by mouth daily.     simvastatin (ZOCOR) 80 MG tablet Take 40 mg by mouth daily at 6 PM. Take half tablet (40 mg) by mouth once daily     sucralfate (CARAFATE) 1 G tablet Take 1 g by mouth 4 (four) times daily -  with meals and at bedtime.     terbinafine (LAMISIL) 1 % cream Apply 1 Application topically 2 (two) times daily.     traMADol (ULTRAM) 50 MG tablet Take 1 tablet (50 mg total) by mouth every 6 (six) hours as needed for moderate pain (pain score 4-6). 30 tablet 5   zolpidem (AMBIEN) 5 MG tablet Take 1 tablet (5 mg total) by mouth at bedtime as needed for sleep. 30 tablet 5   No current facility-administered medications for this visit.    PHYSICAL EXAMINATION: ECOG PERFORMANCE STATUS: 2 - Symptomatic, <50% confined to bed  Vitals:   03/18/24 0825 03/18/24 0847   BP: (!) 160/90 (!) 160/84  Pulse: 65 65  Resp: 17 17  Temp: 97.8 F (36.6 C) 97.6 F (36.4 C)  SpO2: 98% 98%   Wt Readings from Last 3 Encounters:  03/18/24 257 lb 6.4 oz (116.8 kg)  02/05/24 254 lb 1.6 oz (115.3 kg)  01/28/24 257 lb 6.4 oz (116.8 kg)     GENERAL:alert, no distress and comfortable SKIN: skin color, texture, turgor are normal, no rashes or significant lesions EYES: normal, Conjunctiva are pink and non-injected, sclera clear NECK: supple, thyroid normal size, non-tender, without nodularity LYMPH:  no palpable lymphadenopathy in the cervical, axillary  LUNGS: clear to auscultation and percussion with normal breathing effort HEART: regular rate & rhythm and no murmurs and no lower extremity edema ABDOMEN:abdomen soft, non-tender and normal bowel sounds Musculoskeletal:no cyanosis of digits and no clubbing  NEURO: alert & oriented x 3 with fluent speech, no focal motor/sensory deficits  Physical Exam    LABORATORY DATA:  I have reviewed the data as listed    Latest Ref Rng & Units 03/18/2024    7:28 AM 02/05/2024    7:45 AM 01/28/2024    9:24 AM  CBC  WBC 4.0 - 10.5 K/uL 75.4  77.8  66.3 Repeated and verified X2.   Hemoglobin 13.0 - 17.0 g/dL 16.1  09.6  04.5   Hematocrit 39.0 - 52.0 % 41.4  46.8  45.9   Platelets 150 - 400 K/uL 137  179  172.0         Latest Ref Rng & Units 03/18/2024    7:28 AM 02/05/2024    7:45 AM 01/28/2024    9:24 AM  CMP  Glucose 70 - 99 mg/dL 409  811  81   BUN 8 - 23 mg/dL 17  17  15    Creatinine 0.61 - 1.24 mg/dL 9.14  7.82  9.56   Sodium 135 - 145 mmol/L 142  142  142   Potassium 3.5 - 5.1 mmol/L 3.7  3.9  4.2   Chloride 98 - 111 mmol/L 105  108  103   CO2 22 - 32 mmol/L 27  25  29    Calcium 8.9 - 10.3 mg/dL 8.8  9.2  9.2   Total Protein 6.5 -  8.1 g/dL 6.2  6.6  6.7   Total Bilirubin 0.0 - 1.2 mg/dL 1.0  1.1  1.4   Alkaline Phos 38 - 126 U/L 52  51  50   AST 15 - 41 U/L 16  23  30    ALT 0 - 44 U/L 17  25  26         RADIOGRAPHIC STUDIES: I have personally reviewed the radiological images as listed and agreed with the findings in the report. No results found.    Orders Placed This Encounter  Procedures   CT CHEST ABDOMEN PELVIS W CONTRAST    Standing Status:   Future    Expected Date:   04/01/2024    Expiration Date:   03/18/2025    If indicated for the ordered procedure, I authorize the administration of contrast media per Radiology protocol:   Yes    Does the patient have a contrast media/X-ray dye allergy?:   No    Preferred imaging location?:   [facility]    If indicated for the ordered procedure, I authorize the administration of oral contrast media per Radiology protocol:   Yes   All questions were answered. The patient knows to call the clinic with any problems, questions or concerns. No barriers to learning was detected. The total time spent in the appointment was 25 minutes.     Sonja [place], MD 03/18/2024

## 2024-03-19 ENCOUNTER — Telehealth: Payer: Self-pay | Admitting: Hematology

## 2024-03-19 LAB — PROSTATE-SPECIFIC AG, SERUM (LABCORP): Prostate Specific Ag, Serum: <0.1 ng/mL (ref 0.0–4.0)

## 2024-04-01 ENCOUNTER — Ambulatory Visit (HOSPITAL_COMMUNITY)

## 2024-04-06 NOTE — Progress Notes (Signed)
 Cardiology Office Note:    Date:  04/09/2024   ID:  [name], DOB 1940-12-06, MRN [id number]  PCP:  Almira Jaeger, MD  Cardiologist:  None  Electrophysiologist:  None   Referring MD: Almira Jaeger, MD   Chief Complaint  Patient presents with   Irregular Heart Beat    History of Present Illness:    [name] is a 83 y.o. male with a hx of hypertension, T2DM, CLL, Parkinson's disease, OSA, prostate cancer who presents for follow-up.  He was admitted 08/2023 with fever and AKI.  No clear source found, thought to be possible viral syndrome.  Kidney function improved with IV fluids.  Suspected he had new onset atrial flutter and cardiology was consulted but on review appeared to be sinus rhythm with PACs.  Echocardiogram 08/09/2023 showed EF 55 to 60%, normal RV function, no significant valvular disease.  He was discharged with Zio patch x 14 days, which showed 1 episode of NSVT lasting 6 beats, 153 episodes of SVT with longest lasting 38 seconds with average rate 113 bpm, frequent PACs (21% of beats).  Since last clinic visit, he reports he is doing okay.  Denies any chest pain or palpitations.  Does report occasional shortness of breath and lightheadedness but denies any syncope.  Has chronic lower extremity edema.    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 Thought to be agent orange related-CLL and prostate cancer  Diagnosis 1. 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 - NO EVIDENCE OF EXTRAPROSTATIC EXTENSION, ANGIOLYMPHATIC INVASION OR SEMINAL VESICLE INVOLVEMENT IDENTIFIED. - RESECTION MARGINS, NEGATIVE FOR ATYPIA OR MALIGNANCY. 2. Lymph nodes, regional resection, righ   Chronic pain  syndrome 02/01/2016   Chronic right hip pain 09/27/2019   DDD (degenerative disc disease), lumbar    Diabetic retinopathy 09/24/2007   Diverticulosis of colon    Elevated PSA    Erectile dysfunction 12/30/2007   No rx.      GERD (gastroesophageal reflux disease)    Gynecomastia 12/28/2020   History of colonic polyps 08/22/2008   2004 3 adenomas 2009 none 2013 none 02/26/2017 8 mm sigmoid polyp was inflammatory - no recall planned given age/co-morbidities overall hx of polyps     History of prostate cancer 01/24/2014   Follows with Dr. Ranae Burrow every 3 months. Dr. Dirk Fredericks every 6 months.  Diagnosis 1. 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 - NO EVIDENCE OF EXTRAPROSTATIC EXTENSION, ANGIOLYMPHATIC INVASION OR SEMINAL VESICLE INVOLVEMENT IDENTIFIED. - RESECTION MARGINS, NEGATIVE FOR ATYPIA OR MALIGNANCY. 2. Lymph nodes, regional resection, right pelvic - SIX   Hyperlipidemia associated with type 2 diabetes mellitus 09/24/2007   High triglycerides. Refused statin due to myalgias in past but then started half tablet of simvastatin  40mg  through [facility] of this note might be different from the original. Formatting of this note might be different from the original. High triglycerides. Refused statin due to myalgias in past but then started half tablet of simvastatin  40mg  through [place]  Last Assessment & Plan:  Formatting    Hypertension associated with diabetes 09/24/2007   Amlodipine  10mg , losartan  100mg , HCTZ 25mg , propranolol  40 mg twice a day Home cuff 142/85  compared to 134/72 on my check. Likely home cuff about 10 points higher.   --> chlorthalidone  03/20/16 but patient didn't change and then BP controlled on next visit   Formatting of this note might be different from the original. Formatting of this note might be different from the original. 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  Follow up in 6 months unless wound issues arise.  Derm follow up in October 2022 scheduled.  I will see him back in 6 months.   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 a1c under 8     Formatting of this note might be different from the original. Formatting of this note might be different from the original. And macular degeneration.   Last Assessment & Plan:  Formatting of this note might be different from the original. S: diabetic retinopathy followed up by optho yesterday and largely stable. He also has m   Vitamin D  deficiency 11/01/2020   At [facility]- on 2000 units a day at least since 2021     Past Surgical History:  Procedure Laterality Date   CHOLECYSTECTOMY     COLONOSCOPY  03/25/12, 09/28/08   ESOPHAGOGASTRODUODENOSCOPY N/A 10/12/2014   Procedure: ESOPHAGOGASTRODUODENOSCOPY (EGD);  Surgeon: Kenney Peacemaker, MD;  Location: [facility];  Service: Endoscopy;  Laterality: N/A;   ESOPHAGOGASTRODUODENOSCOPY ENDOSCOPY  09/28/08   EXCISION MELANOMA WITH SENTINEL LYMPH NODE BIOPSY Right 05/31/2021   Procedure: WIDE LOCAL EXCISION RIGHT FOREARM WITH ADVANCEDMENT FLAP CLOSURE WITH SENTINEL LYMPH NODE MAPPING AND BIOSPY;  Surgeon: Lockie Rima, MD;   Location: [facility];  Service: General;  Laterality: Right;   FOOT SURGERY Bilateral    HEMORRHOID SURGERY     LAPAROSCOPIC GASTRIC BANDING  03/05/11   weight loss   LAPAROSCOPIC GASTRIC BANDING WITH HIATAL HERNIA REPAIR  03/05/2011   LYMPHADENECTOMY Bilateral 01/24/2014   Procedure: LYMPHADENECTOMY;  Surgeon: Kristeen Peto, MD;  Location: [facility];  Service: Urology;  Laterality: Bilateral;   ORIF TIBIA FRACTURE Right    PENILE PROSTHESIS IMPLANT     PROSTATE SURGERY  01/2014   ROBOT ASSISTED LAPAROSCOPIC RADICAL PROSTATECTOMY N/A 01/24/2014   Procedure: ROBOTIC ASSISTED LAPAROSCOPIC RADICAL PROSTATECTOMY LEVEL 3;  Surgeon: Kristeen Peto, MD;  Location: [facility];  Service: Urology;  Laterality: N/A;   SHOULDER SURGERY Right 2011   TONSILLECTOMY     age 71   VASECTOMY      Current Medications: Current Meds  Medication Sig   amLODipine  (NORVASC ) 10 MG tablet Take 10 mg by mouth daily.   busPIRone  (BUSPAR ) 10 MG tablet Take by mouth 3 (three) times daily.   Cholecalciferol (  VITAMIN D3) 50 MCG (2000 UT) TABS Take 1 tablet by mouth daily.   empagliflozin  (JARDIANCE ) 25 MG TABS tablet Take 1 tablet (25 mg total) by mouth daily before breakfast. ([facility]IV[facility])   fenofibrate (TRICOR) 145 MG tablet Take 160 mg by mouth daily.   gabapentin  (NEURONTIN ) 300 MG capsule Take 600 mg by mouth 3 (three) times daily.   glucose blood test strip Use to test 4 times daily. E11.9   hydrochlorothiazide (HYDRODIURIL) 25 MG tablet Take 25 mg by mouth daily.   insulin  aspart (NOVOLOG ) 100 UNIT/ML injection Inject 35 Units into the skin 3 (three) times daily with meals. When sugar is elevated   insulin  glargine (LANTUS ) 100 UNIT/ML injection Inject 50 Units into the skin at bedtime.   levothyroxine  (SYNTHROID , LEVOTHROID) 50 MCG tablet Take 50 mcg by mouth daily before breakfast.   losartan  (COZAAR ) 100 MG tablet Take 100 mg by mouth daily.   mirtazapine  (REMERON ) 15 MG tablet Take 15 mg by mouth at bedtime.    neomycin -polymyxin b-dexamethasone  (MAXITROL ) 3.5-10000-0.1 SUSP Place 1 drop into both eyes 3 (three) times daily.   OVER THE COUNTER MEDICATION PreserVision for eyes   potassium chloride  SA (K-DUR,KLOR-CON ) 20 MEQ tablet Take 20 mEq by mouth daily.   simvastatin  (ZOCOR ) 80 MG tablet Take 40 mg by mouth daily at 6 PM. Take half tablet (40 mg) by mouth once daily   sucralfate  (CARAFATE ) 1 G tablet Take 1 g by mouth 4 (four) times daily -  with meals and at bedtime.   terbinafine  (LAMISIL ) 1 % cream Apply 1 Application topically 2 (two) times daily.   traMADol  (ULTRAM ) 50 MG tablet Take 1 tablet (50 mg total) by mouth every 6 (six) hours as needed for moderate pain (pain score 4-6).   zolpidem  (AMBIEN ) 5 MG tablet Take 1 tablet (5 mg total) by mouth at bedtime as needed for sleep.     Allergies:   Morphine sulfate   Social History   Socioeconomic History   Marital status: Married    Spouse name: Not on file   Number of children: 5   Years of education: 14   Highest education level: Associate degree: occupational, Scientist, product/process development, or vocational program  Occupational History   Occupation: retired    Associate Professor: RETIRED    Comment: army x 2 years; Designer, fashion/clothing; Personnel officer  Tobacco Use   Smoking status: Former    Current packs/day: 0.00    Average packs/day: 1 pack/day for 20.0 years (20.0 ttl pk-yrs)    Types: Cigarettes    Start date: 02/25/1961    Quit date: 02/25/1981    Years since quitting: 43.1   Smokeless tobacco: Never  Vaping Use   Vaping status: Never Used  Substance and Sexual Activity   Alcohol use: Not Currently    Comment: stopped drinking 76 or 77    Drug use: No   Sexual activity: Yes    Comment: Been able to utilize his penile prosthesis successfully  Other Topics Concern   Not on file  Social History Narrative   Married. 3 children from previous marriage, 2 step children. 15 grandkids.       Electrical work      Hobbies: previous Training and development officer but with macular  degeneration could not, watch tv ([employer])   Social Drivers of Corporate investment banker Strain: Low Risk  (10/10/2023)   Overall Financial Resource Strain (CARDIA)    Difficulty of Paying Living Expenses: Not hard at all  Food Insecurity:  No Food Insecurity (10/10/2023)   Hunger Vital Sign    Worried About Running Out of Food in the Last Year: Never true    Ran Out of Food in the Last Year: Never true  Transportation Needs: No Transportation Needs (10/10/2023)   PRAPARE - Administrator, Civil Service (Medical): No    Lack of Transportation (Non-Medical): No  Physical Activity: Insufficiently Active (10/10/2023)   Exercise Vital Sign    Days of Exercise per Week: 1 day    Minutes of Exercise per Session: 10 min  Stress: Stress Concern Present (10/10/2023)   [employer] of Occupational Health - Occupational Stress Questionnaire    Feeling of Stress : To some extent  Social Connections: Socially Integrated (10/10/2023)   Social Connection and Isolation Panel [NHANES]    Frequency of Communication with Friends and Family: Twice a week    Frequency of Social Gatherings with Friends and Family: Twice a week    Attends Religious Services: 1 to 4 times per year    Active Member of [employer] or Organizations: Yes    Attends Banker Meetings: 1 to 4 times per year    Marital Status: Married     Family History: The patient's family history includes Alcohol abuse in his brother; Diabetes in his child; Heart attack in his paternal uncle; Hypertension in his child, mother, and paternal uncle; Parkinson's disease in an other family member; Stomach cancer (age of onset: 56) in his mother. There is no history of Colon cancer, Colon polyps, or Rectal cancer.  ROS:   Please see the history of present illness.     All other systems reviewed and are negative.  EKGs/Labs/Other Studies Reviewed:    The following studies were reviewed today:   EKG:   04/09/24: Sinus  rhythm, left bundle branch block, rate 85.    Recent Labs: 10/14/2023: Magnesium 2.2 01/28/2024: TSH 1.40 03/18/2024: ALT 17; BUN 17; Creatinine, Ser 1.15; Hemoglobin 13.7; Platelet Count 137; Potassium 3.7; Sodium 142  Recent Lipid Panel    Component Value Date/Time   CHOL 124 03/26/2023 0000   TRIG 221 (A) 03/26/2023 0000   HDL 48 03/26/2023 0000   CHOLHDL 3 04/22/2022 0859   VLDL 74.4 (H) 04/22/2022 0859   LDLCALC 32 03/26/2023 0000   LDLDIRECT 55.0 04/22/2022 0859    Physical Exam:    VS:  BP 120/66   Pulse 85   Ht 6' (1.829 m)   Wt 253 lb (114.8 kg)   SpO2 94%   BMI 34.31 kg/m     Wt Readings from Last 3 Encounters:  04/09/24 253 lb (114.8 kg)  03/18/24 257 lb 6.4 oz (116.8 kg)  02/05/24 254 lb 1.6 oz (115.3 kg)     GEN:  Well nourished, well developed in no acute distress HEENT: Normal NECK: No JVD; No carotid bruits CARDIAC: irregular, no murmurs RESPIRATORY:  Clear to auscultation without rales, wheezing or rhonchi  ABDOMEN: Soft, non-tender, non-distended MUSCULOSKELETAL:  trace edema; No deformity  SKIN: Warm and dry NEUROLOGIC:  Alert and oriented x 3 PSYCHIATRIC:  Normal affect   ASSESSMENT:    1. SVT (supraventricular tachycardia) (HCC)   2. Essential hypertension   3. Hyperlipidemia associated with type 2 diabetes mellitus     PLAN:    SVT/PACs:Zio patch x 14 days 09/2023 showed 1 episode of NSVT lasting 6 beats, 153 episodes of SVT with longest lasting 38 seconds with average rate 113 bpm, frequent PACs (21% of beats).  Echocardiogram 08/09/2023 showed EF 55 to 60%, normal RV function, no significant valvular disease. -Resting heart rate in 50s and appears asymptomatic, will hold off on starting beta-blocker.  -No A-fib seen, discussed Kardia mobile for long-term monitoring  Lightheadedness: No carotid stenosis on duplex 10/2023  Hypertension: On amlodipine  10 mg daily, HCTZ 25 mg daily, losartan  100 mg daily.  Appears  controlled  Hyperlipidemia: On simvastatin  40 mg daily and Tricor 145 mg daily.  LDL 32 03/2023  T2DM: On insulin , Jardiance .  A1c 7.5% on 01/2024  OSA: Reports compliance with CPAP  RTC in 6 months  Medication Adjustments/Labs and Tests Ordered: Current medicines are reviewed at length with the patient today.  Concerns regarding medicines are outlined above.  Orders Placed This Encounter  Procedures   EKG 12-Lead   No orders of the defined types were placed in this encounter.   Patient Instructions  Medication Instructions:  No changes *If you need a refill on your cardiac medications before your next appointment, please call your pharmacy*  You can look into the [facility] device by [employer]. This device is purchased by you and it connects to an application you download to your smart phone.  It can detect abnormal heart rhythms and alert you to contact your doctor for further evaluation. The web site is:  [email or web address].alivecor.[email or web address]    Follow-Up: At [facility], you and your health needs are our priority.  As part of our continuing mission to provide you with exceptional heart care, our providers are all part of one team.  This team includes your primary Cardiologist (physician) and Advanced Practice Providers or APPs (Physician Assistants and Nurse Practitioners) who all work together to provide you with the care you need, when you need it.  Your next appointment:   6 month(s)  Provider:   Dr Alda Amas  We recommend signing up for the patient portal called "MyChart".  Sign up information is provided on this After Visit Summary.  MyChart is used to connect with patients for Virtual Visits (Telemedicine).  Patients are able to view lab/test results, encounter notes, upcoming appointments, etc.  Non-urgent messages can be sent to your provider as well.   To learn more about what you can do with MyChart, go to [email or web address].          Signed, Wendie Hamburg, MD  04/09/2024 1:48 PM     [facility]

## 2024-04-07 LAB — LIPID PANEL
Cholesterol: 94 (ref 0–200)
HDL: 29 — AB (ref 35–70)
LDL Cholesterol: 34

## 2024-04-07 LAB — HEMOGLOBIN A1C: Hemoglobin A1C: 7.7

## 2024-04-09 ENCOUNTER — Ambulatory Visit (HOSPITAL_COMMUNITY)
Admission: RE | Admit: 2024-04-09 | Discharge: 2024-04-09 | Disposition: A | Discharge status: Home / Self Care | Source: Ambulatory Visit | Attending: Hematology | Admitting: Hematology

## 2024-04-09 ENCOUNTER — Encounter: Payer: Self-pay | Admitting: Cardiology

## 2024-04-09 ENCOUNTER — Ambulatory Visit (INDEPENDENT_AMBULATORY_CARE_PROVIDER_SITE_OTHER): Payer: Medicare Other | Admitting: Cardiology

## 2024-04-09 VITALS — BP 120/66 | HR 85 | Ht 72.0 in | Wt 253.0 lb

## 2024-04-09 DIAGNOSIS — E1169 Type 2 diabetes mellitus with other specified complication: Secondary | ICD-10-CM

## 2024-04-09 DIAGNOSIS — I471 Supraventricular tachycardia, unspecified: Secondary | ICD-10-CM | POA: Diagnosis not present

## 2024-04-09 DIAGNOSIS — C9110 Chronic lymphocytic leukemia of B-cell type not having achieved remission: Secondary | ICD-10-CM | POA: Insufficient documentation

## 2024-04-09 DIAGNOSIS — I10 Essential (primary) hypertension: Secondary | ICD-10-CM

## 2024-04-09 DIAGNOSIS — E785 Hyperlipidemia, unspecified: Secondary | ICD-10-CM | POA: Diagnosis not present

## 2024-04-09 DIAGNOSIS — I700 Atherosclerosis of aorta: Secondary | ICD-10-CM | POA: Diagnosis not present

## 2024-04-09 DIAGNOSIS — R591 Generalized enlarged lymph nodes: Secondary | ICD-10-CM | POA: Diagnosis not present

## 2024-04-09 MED ORDER — IOHEXOL 300 MG/ML  SOLN
100.0000 mL | Freq: Once | INTRAMUSCULAR | Status: AC | PRN
  Administered 2024-04-09: 100 mL via INTRAVENOUS

## 2024-04-09 MED ORDER — SODIUM CHLORIDE (PF) 0.9 % IJ SOLN
INTRAMUSCULAR | Status: AC
  Filled 2024-04-09: qty 50

## 2024-04-09 NOTE — Patient Instructions (Signed)
 Medication Instructions:  No changes *If you need a refill on your cardiac medications before your next appointment, please call your pharmacy*  You can look into the [facility] device by [employer]. This device is purchased by you and it connects to an application you download to your smart phone.  It can detect abnormal heart rhythms and alert you to contact your doctor for further evaluation. The web site is:  [email or web address].alivecor.[email or web address]    Follow-Up: At [facility], you and your health needs are our priority.  As part of our continuing mission to provide you with exceptional heart care, our providers are all part of one team.  This team includes your primary Cardiologist (physician) and Advanced Practice Providers or APPs (Physician Assistants and Nurse Practitioners) who all work together to provide you with the care you need, when you need it.  Your next appointment:   6 month(s)  Provider:   Dr Alda Amas  We recommend signing up for the patient portal called "MyChart".  Sign up information is provided on this After Visit Summary.  MyChart is used to connect with patients for Virtual Visits (Telemedicine).  Patients are able to view lab/test results, encounter notes, upcoming appointments, etc.  Non-urgent messages can be sent to your provider as well.   To learn more about what you can do with MyChart, go to [email or web address].

## 2024-04-10 ENCOUNTER — Encounter: Payer: Self-pay | Admitting: Family Medicine

## 2024-04-10 ENCOUNTER — Encounter: Payer: Self-pay | Admitting: Cardiology

## 2024-04-10 ENCOUNTER — Encounter: Payer: Self-pay | Admitting: Hematology

## 2024-04-12 ENCOUNTER — Inpatient Hospital Stay: Attending: Hematology | Admitting: Hematology

## 2024-04-12 DIAGNOSIS — F419 Anxiety disorder, unspecified: Secondary | ICD-10-CM | POA: Diagnosis not present

## 2024-04-12 DIAGNOSIS — C9110 Chronic lymphocytic leukemia of B-cell type not having achieved remission: Secondary | ICD-10-CM | POA: Insufficient documentation

## 2024-04-12 NOTE — Progress Notes (Signed)
 [facility]   [phone] Fax:(336) [phone]   Clinic Follow up Note   Patient Care Team: Almira Jaeger, MD as PCP - General (Family Medicine) Rexene Catching, MD as Consulting Physician (Ophthalmology) Devon Fogo, MD (Inactive) as Consulting Physician (Dermatology) Myrle Aspen, [facility] (Inactive) (Pharmacist) Sonja [place], MD as Consulting Physician (Hematology and Oncology) Wendie Hamburg, MD as Consulting Physician (Cardiology) 04/12/2024  I connected with [name] on 04/12/24 at  2:20 PM EDT by telephone and verified that I am speaking with the correct person using two identifiers.   I discussed the limitations, risks, security and privacy concerns of performing an evaluation and management service by telephone and the availability of in person appointments. I also discussed with the patient that there may be a patient responsible charge related to this service. The patient expressed understanding and agreed to proceed.   Patient's location:  Home  Provider's location:  Office    CHIEF COMPLAINT: f/u CLL    CURRENT THERAPY: observation   Oncology history Chronic lymphocytic leukemia (HCC) -Diagnosed in 2015 after presenting with lymphocytosis and adenopathy -Stage 0, on active surveillance, has not required any treatment. 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 Results for CCND1/IGH, ATM, chromosome 12, and TP53 were normal.  -CT 04/09/2024 showed diffuse adenopathy in neck, abdomen and pelvis, up to 2 cm, new splenectomy and persistent hepatomegaly.  Assessment & Plan Chronic lymphocytic leukemia (CLL) Chronic lymphocytic leukemia with small enlarged lymph nodes, liver, and spleen. No urgent findings on recent CT scan. Current status does not require treatment. CLL is a  chronic condition, and monitoring without immediate intervention is acceptable. - Monitor CLL without treatment at this time - Schedule follow-up appointment in July  Anxiety Experiencing anxiety related to concerns about CLL and its implications. Expressed desire for medication to manage anxiety. Advised to consult with primary care physician for management, as it is outside the oncologist's specialty. - Consult primary care physician for anxiety management  Plan -CT scan images reviewed and discussed with patient and his wife - Continue observation and close monitoring - lab and follow-up in 2 months   Discussed the use of AI scribe software for clinical note transcription with the patient, who gave verbal consent to proceed.  History of Present Illness [name] "[name]" is an 83 year old male with chronic lymphocytic leukemia who presents for a follow-up visit. He is accompanied by his wife, [name].  A recent CT scan shows multiple enlarged lymph nodes, an enlarged liver, and an enlarged spleen, consistent with his chronic lymphocytic leukemia. These findings do not currently require immediate treatment.  He experiences anxiety and describes feeling nervous about his health status. He has expressed interest in medication to manage his anxiety.     REVIEW OF SYSTEMS:   Constitutional: Denies fevers, chills or abnormal weight loss Eyes: Denies blurriness of vision Ears, nose, mouth, throat, and face: Denies mucositis or sore throat Respiratory: Denies cough, dyspnea or wheezes Cardiovascular: Denies palpitation, chest discomfort or lower extremity swelling Gastrointestinal:  Denies nausea, heartburn or change in bowel habits Skin: Denies abnormal skin rashes Lymphatics: Denies new lymphadenopathy or easy bruising Neurological:Denies numbness, tingling or new weaknesses Behavioral/Psych: Mood is stable, no new changes  All other systems were reviewed with the patient  and are negative.  MEDICAL HISTORY:  Past Medical History:  Diagnosis Date   Arthritis    Basal cell carcinoma 03/18/2017   left neck CX3 5FU   Bell's palsy 05/08/2020   Cataract    Chronic bilateral low back pain without sciatica 12/12/2015   Chronic lymphocytic leukemia 05/16/2014   Oncology q6 months. Thought to be agent orange related-CLL and prostate cancer  Diagnosis 1. 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 - NO EVIDENCE OF EXTRAPROSTATIC EXTENSION, ANGIOLYMPHATIC INVASION OR SEMINAL VESICLE INVOLVEMENT IDENTIFIED. - RESECTION MARGINS, NEGATIVE FOR ATYPIA OR MALIGNANCY. 2. Lymph nodes, regional resection, righ   Chronic pain syndrome 02/01/2016   Chronic right hip pain 09/27/2019   DDD (degenerative disc disease), lumbar    Diabetic retinopathy 09/24/2007   Diverticulosis of colon    Elevated PSA    Erectile dysfunction 12/30/2007   No rx.      GERD (gastroesophageal reflux disease)    Gynecomastia 12/28/2020   History of colonic polyps 08/22/2008   2004 3 adenomas 2009 none 2013 none 02/26/2017 8 mm sigmoid polyp was inflammatory - no recall planned given age/co-morbidities overall hx of polyps     History of prostate cancer 01/24/2014   Follows with Dr. Ranae Burrow every 3 months. Dr. Dirk Fredericks every 6 months.  Diagnosis 1. 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 - NO EVIDENCE OF EXTRAPROSTATIC EXTENSION, ANGIOLYMPHATIC INVASION OR SEMINAL VESICLE INVOLVEMENT IDENTIFIED. - RESECTION MARGINS, NEGATIVE FOR ATYPIA OR MALIGNANCY. 2. Lymph nodes, regional resection, right pelvic - SIX   Hyperlipidemia associated with type 2 diabetes mellitus 09/24/2007   High triglycerides. Refused statin due to myalgias in past but then started half tablet of simvastatin  40mg  through [facility] of this note might be different from the original. Formatting of this note might be different  from the original. High triglycerides. Refused statin due to myalgias in past but then started half tablet of simvastatin  40mg  through [place]  Last Assessment & Plan:  Formatting    Hypertension associated with diabetes 09/24/2007   Amlodipine  10mg , losartan  100mg , HCTZ 25mg , propranolol  40 mg twice a day Home cuff 142/85 compared to 134/72 on my check. Likely home cuff about 10 points higher.   --> chlorthalidone  03/20/16 but patient didn't change and then BP controlled on next visit   Formatting of this note might be different from the original. Formatting of this note might be different from the original. 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  Follow up in 6 months unless wound issues arise.  Derm follow up in October 2022 scheduled.  I will see him back in 6 months.   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 a1c under 8     Formatting of this note might be different from the original. Formatting of this note might be different from the original. And macular degeneration.   Last Assessment & Plan:  Formatting of this note might be different from the original. S: diabetic retinopathy followed up by  optho yesterday and largely stable. He also has m   Vitamin D  deficiency 11/01/2020   At [facility]- on 2000 units a day at least since 2021     SURGICAL HISTORY: Past Surgical History:  Procedure Laterality Date   CHOLECYSTECTOMY     COLONOSCOPY  03/25/12, 09/28/08   ESOPHAGOGASTRODUODENOSCOPY N/A 10/12/2014   Procedure: ESOPHAGOGASTRODUODENOSCOPY (EGD);  Surgeon: Kenney Peacemaker, MD;  Location: [facility];  Service: Endoscopy;  Laterality: N/A;   ESOPHAGOGASTRODUODENOSCOPY ENDOSCOPY  09/28/08   EXCISION MELANOMA WITH SENTINEL LYMPH NODE BIOPSY Right 05/31/2021   Procedure: WIDE LOCAL EXCISION RIGHT FOREARM WITH ADVANCEDMENT FLAP CLOSURE WITH SENTINEL LYMPH NODE MAPPING AND BIOSPY;  Surgeon: Lockie Rima, MD;  Location: [facility];  Service: General;  Laterality: Right;   FOOT SURGERY Bilateral    HEMORRHOID SURGERY     LAPAROSCOPIC GASTRIC BANDING  03/05/11   weight loss   LAPAROSCOPIC GASTRIC BANDING WITH HIATAL HERNIA REPAIR  03/05/2011   LYMPHADENECTOMY Bilateral 01/24/2014   Procedure: LYMPHADENECTOMY;  Surgeon: Kristeen Peto, MD;  Location: [facility];  Service: Urology;  Laterality: Bilateral;   ORIF TIBIA FRACTURE Right    PENILE PROSTHESIS IMPLANT     PROSTATE SURGERY  01/2014   ROBOT ASSISTED LAPAROSCOPIC RADICAL PROSTATECTOMY N/A 01/24/2014   Procedure: ROBOTIC ASSISTED LAPAROSCOPIC RADICAL PROSTATECTOMY LEVEL 3;  Surgeon: Kristeen Peto, MD;  Location: [facility];  Service: Urology;  Laterality: N/A;   SHOULDER SURGERY Right 2011   TONSILLECTOMY     age 83   VASECTOMY      I have reviewed the social history and family history with the patient and they are unchanged from previous note.  ALLERGIES:  is allergic to morphine sulfate.  MEDICATIONS:  Current Outpatient Medications  Medication Sig Dispense Refill   amLODipine  (NORVASC ) 10 MG tablet Take 10 mg by mouth daily.     busPIRone  (BUSPAR ) 10 MG tablet Take by mouth 3 (three) times daily.     Cholecalciferol (VITAMIN D3) 50  MCG (2000 UT) TABS Take 1 tablet by mouth daily.     empagliflozin  (JARDIANCE ) 25 MG TABS tablet Take 1 tablet (25 mg total) by mouth daily before breakfast. ([facility]) 30 tablet    fenofibrate (TRICOR) 145 MG tablet Take 160 mg by mouth daily.     gabapentin  (NEURONTIN ) 300 MG capsule Take 600 mg by mouth 3 (three) times daily.     glucose blood test strip Use to test 4 times daily. E11.9 100 each 12   hydrochlorothiazide (HYDRODIURIL) 25 MG tablet Take 25 mg by mouth daily.     insulin  aspart (NOVOLOG ) 100 UNIT/ML injection Inject 35 Units into the skin 3 (three) times daily with meals. When sugar is elevated     insulin  glargine (LANTUS ) 100 UNIT/ML injection Inject 50 Units into the skin at bedtime.     levothyroxine  (SYNTHROID , LEVOTHROID) 50 MCG tablet Take 50 mcg by mouth daily before breakfast.     losartan  (COZAAR ) 100 MG tablet Take 100 mg by mouth daily.     mirtazapine  (REMERON ) 15 MG tablet Take 15 mg by  mouth at bedtime.     neomycin -polymyxin b-dexamethasone  (MAXITROL ) 3.5-10000-0.1 SUSP Place 1 drop into both eyes 3 (three) times daily.     OVER THE COUNTER MEDICATION PreserVision for eyes     potassium chloride  SA (K-DUR,KLOR-CON ) 20 MEQ tablet Take 20 mEq by mouth daily.     simvastatin  (ZOCOR ) 80 MG tablet Take 40 mg by mouth daily at 6 PM. Take half tablet (40 mg) by mouth once daily     sucralfate  (CARAFATE ) 1 G tablet Take 1 g by mouth 4 (four) times daily -  with meals and at bedtime.     terbinafine  (LAMISIL ) 1 % cream Apply 1 Application topically 2 (two) times daily.     traMADol  (ULTRAM ) 50 MG tablet Take 1 tablet (50 mg total) by mouth every 6 (six) hours as needed for moderate pain (pain score 4-6). 30 tablet 5   zolpidem  (AMBIEN ) 5 MG tablet Take 1 tablet (5 mg total) by mouth at bedtime as needed for sleep. 30 tablet 5   No current facility-administered medications for this visit.    PHYSICAL EXAMINATION: Not performed   LABORATORY DATA:  I have reviewed  the data as listed    Latest Ref Rng & Units 03/18/2024    7:28 AM 02/05/2024    7:45 AM 01/28/2024    9:24 AM  CBC  WBC 4.0 - 10.5 K/uL 75.4  77.8  66.3 Repeated and verified X2.   Hemoglobin 13.0 - 17.0 g/dL 40.9  81.1  91.4   Hematocrit 39.0 - 52.0 % 41.4  46.8  45.9   Platelets 150 - 400 K/uL 137  179  172.0         Latest Ref Rng & Units 03/18/2024    7:28 AM 02/05/2024    7:45 AM 01/28/2024    9:24 AM  CMP  Glucose 70 - 99 mg/dL 782  956  81   BUN 8 - 23 mg/dL 17  17  15    Creatinine 0.61 - 1.24 mg/dL 2.13  0.86  5.78   Sodium 135 - 145 mmol/L 142  142  142   Potassium 3.5 - 5.1 mmol/L 3.7  3.9  4.2   Chloride 98 - 111 mmol/L 105  108  103   CO2 22 - 32 mmol/L 27  25  29    Calcium  8.9 - 10.3 mg/dL 8.8  9.2  9.2   Total Protein 6.5 - 8.1 g/dL 6.2  6.6  6.7   Total Bilirubin 0.0 - 1.2 mg/dL 1.0  1.1  1.4   Alkaline Phos 38 - 126 U/L 52  51  50   AST 15 - 41 U/L 16  23  30    ALT 0 - 44 U/L 17  25  26        RADIOGRAPHIC STUDIES: I have personally reviewed the radiological images as listed and agreed with the findings in the report. No results found.     I discussed the assessment and treatment plan with the patient. The patient was provided an opportunity to ask questions and all were answered. The patient agreed with the plan and demonstrated an understanding of the instructions.   The patient was advised to call back or seek an in-person evaluation if the symptoms worsen or if the condition fails to improve as anticipated.  I provided 15 minutes of non face-to-face telephone visit time during this encounter, including review of chart and various tests results, discussions about plan of care and coordination of care  plan.    Sonja Fisher, MD 04/12/24

## 2024-04-12 NOTE — Assessment & Plan Note (Signed)
-  Diagnosed in 2015 after presenting with lymphocytosis and adenopathy -Stage 0, on active surveillance, has not required any treatment. 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 Results for CCND1/IGH, ATM, chromosome 12, and TP53 were normal.  -CT 04/09/2024 showed diffuse adenopathy in neck, abdomen and pelvis, up to 2 cm, new splenectomy and persistent hepatomegaly.

## 2024-04-18 ENCOUNTER — Encounter: Payer: Self-pay | Admitting: Neurology

## 2024-04-18 NOTE — Telephone Encounter (Signed)
 Coronary artery atherosclerosis noted on CT chest.  He should continue taking statin medication

## 2024-04-20 ENCOUNTER — Other Ambulatory Visit: Payer: Self-pay

## 2024-04-20 DIAGNOSIS — C9110 Chronic lymphocytic leukemia of B-cell type not having achieved remission: Secondary | ICD-10-CM

## 2024-04-20 DIAGNOSIS — C4A61 Merkel cell carcinoma of right upper limb, including shoulder: Secondary | ICD-10-CM

## 2024-04-20 NOTE — Progress Notes (Signed)
 Assessment/Plan:   1.  Parkinsons Disease with levodopa  resistant tremor  -Patient is status post focused ultrasound to the left VIM.  He had that done at [facility] on December 18, 2022.  - Patient's balance did get worse somewhat post focused ultrasound, and that is fairly common postprocedure and that was discussed with him preprocedure.  This was actually the reason that [facility] did not want to go in and do the second side.  However, deconditioning and lack of exercise also plays a role here.  -pt with R facial droop since his focused ultrasound and I told him that the R eye isn't blinking well.  He needs to use extra lubrication  -he is dragging the R leg more (may just be post u/s) and is more confused.  He declines both CT brain and MRI brain  -declines PT, which is really the thing that I think he needs in the most.  - Discussed going back on levodopa , at least for decreasing our risk of falls.  I discussed that it would not help the rest tremor, as we have proven in the past that he has levodopa  resistant tremor.  He was going to let his wife decide this.  They decided to think about it and let me know. 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  -He is following with [facility] dermatology.   2.  Anxiety  -Following with psychiatry at [facility].  -On [facility]apine  by [facility], 7.5 mg at bed  - On BuSpar   3.  Hypertension  -On several antihypertensives including hydrochlorothiazide, losartan , amlodipine .  We will need to watch this in the future given the nature of Parkinson's disease for causing dysautonomia.  Blood pressure was good in the office and wife is going to monitor it at home.  I told her she does not need to take this daily, but perhaps a few times per week.  4.  Diabetes, type II, insulin -dependent  -Managed by Dr. Arlene Ben.  5.  CLL, diagnosed 2015  -Patient is following with oncology.  - No treatment currently  6.  New onset A-fib/a flutter in  September, 2024  -Currently not on anticoagulation.    - Zio patch in October, 2024 without evidence of A-fib, but he did have 153 episodes of SVT and frequent PACs. Subjective:   [name] was seen today in follow up for Parkinsons disease.  Patient previously had levodopa  resistant tremor.  He is status post focused ultrasound last January to the left VIM.  He wanted the right VIM done and was scheduled for October of last year, but ultimately it was declined because of the fact that it worsened his balance.  He also had a hospitalization last year for new onset a flutter and left bundle branch block and got very deconditioned and had worsening of weakness and balance following that.  His family emailed me 2 days ago because patient was having trouble with balance.  I told them that we would discuss that at today's visit.  He c/o balance issues and tremor issues today.  He also c/o confusion/memory change.  "I slur my words."  He worries about fact he had a stroke.    Current prescribed movement disorder medications:  Mirtazapine , 7.5 mg nightly (by [facility])   ALLERGIES:   Allergies  Allergen Reactions   Morphine Sulfate Itching and Rash    CURRENT MEDICATIONS:  Current Meds  Medication Sig   amLODipine  (NORVASC ) 10 MG tablet Take  10 mg by mouth daily.   busPIRone  (BUSPAR ) 10 MG tablet Take by mouth 3 (three) times daily.   Cholecalciferol (VITAMIN D3) 50 MCG (2000 UT) TABS Take 1 tablet by mouth daily.   empagliflozin  (JARDIANCE ) 25 MG TABS tablet Take 1 tablet (25 mg total) by mouth daily before breakfast. ([facility])   fenofibrate (TRICOR) 145 MG tablet Take 160 mg by mouth daily.   gabapentin  (NEURONTIN ) 300 MG capsule Take 600 mg by mouth 3 (three) times daily.   glucose blood test strip Use to test 4 times daily. E11.9   hydrochlorothiazide (HYDRODIURIL) 25 MG tablet Take 25 mg by mouth daily.   insulin  aspart (NOVOLOG ) 100 UNIT/ML injection Inject 35 Units into the skin 3  (three) times daily with meals. When sugar is elevated   insulin  glargine (LANTUS ) 100 UNIT/ML injection Inject 50 Units into the skin at bedtime.   levothyroxine  (SYNTHROID , LEVOTHROID) 50 MCG tablet Take 50 mcg by mouth daily before breakfast.   losartan  (COZAAR ) 100 MG tablet Take 100 mg by mouth daily.   mirtazapine  (REMERON ) 15 MG tablet Take 15 mg by mouth at bedtime.   neomycin -polymyxin b-dexamethasone  (MAXITROL ) 3.5-10000-0.1 SUSP Place 1 drop into both eyes 3 (three) times daily.   OVER THE COUNTER MEDICATION PreserVision for eyes   potassium chloride  SA (K-DUR,KLOR-CON ) 20 MEQ tablet Take 20 mEq by mouth daily.   simvastatin  (ZOCOR ) 80 MG tablet Take 40 mg by mouth daily at 6 PM. Take half tablet (40 mg) by mouth once daily   sucralfate  (CARAFATE ) 1 G tablet Take 1 g by mouth 4 (four) times daily -  with meals and at bedtime.   terbinafine  (LAMISIL ) 1 % cream Apply 1 Application topically 2 (two) times daily.   traMADol  (ULTRAM ) 50 MG tablet Take 1 tablet (50 mg total) by mouth every 6 (six) hours as needed for moderate pain (pain score 4-6).   zolpidem  (AMBIEN ) 5 MG tablet Take 1 tablet (5 mg total) by mouth at bedtime as needed for sleep.     Objective:   PHYSICAL EXAMINATION:    VITALS:   Vitals:   04/21/24 1449  BP: 132/78  Pulse: 78  SpO2: 98%     GEN:  The patient appears stated age and is in NAD. HEENT:  Normocephalic, atraumatic.  The mucous membranes are moist. The superficial temporal arteries are without ropiness or tenderness. CV:  RRR Lungs:  CTAB  Neurological examination:  Orientation: The patient is alert and oriented x3. Cranial nerves: There is mild R facial droop.  The speech is fluent and clear. Soft palate rises symmetrically and there is no tongue deviation. Hearing is significantly decreased to conversational tone. Sensation: Sensation is intact to light touch throughout Motor: Strength is 5/5 in the UE/LE  Movement examination: Tone: There  is normal tone in the upper and lower extremities Abnormal movements: there is left upper extremity rest tremor. Coordination:  There is mild decremation with finger taps on the left.   Gait and Station: The patient pushes off to arise.  He is flexed at the waist and is very slow and short stepped today.  He is dragging the R leg.  I have reviewed and interpreted the following labs independently    Chemistry      Component Value Date/Time   NA 140 04/21/2024 1056   NA 143 10/14/2023 1454   NA 142 10/30/2017 0845   K 4.3 04/21/2024 1056   K 3.7 10/30/2017 0845   CL  104 04/21/2024 1056   CO2 31 04/21/2024 1056   CO2 28 10/30/2017 0845   BUN 16 04/21/2024 1056   BUN 15 10/14/2023 1454   BUN 13.3 10/30/2017 0845   CREATININE 1.04 04/21/2024 1056   CREATININE 1.2 10/30/2017 0845   GLU 70 03/26/2023 0000      Component Value Date/Time   CALCIUM  9.3 04/21/2024 1056   CALCIUM  9.5 10/30/2017 0845   ALKPHOS 50 04/21/2024 1056   ALKPHOS 53 10/30/2017 0845   AST 19 04/21/2024 1056   AST 37 (H) 10/30/2017 0845   ALT 19 04/21/2024 1056   ALT 37 10/30/2017 0845   BILITOT 1.4 (H) 04/21/2024 1056   BILITOT 1.23 (H) 10/30/2017 0845       Lab Results  Component Value Date   WBC 71.8 (HH) 04/21/2024   HGB 14.3 04/21/2024   HCT 42.5 04/21/2024   MCV 82.8 04/21/2024   PLT 166 04/21/2024    Lab Results  Component Value Date   TSH 1.40 01/28/2024     Total time spent on today's visit was 30 minutes, including both face-to-face time and nonface-to-face time.  Time included that spent on review of records (prior notes available to me/labs/imaging if pertinent), discussing treatment and goals, answering patient's questions and coordinating care.   Cc:  Almira Jaeger, MD

## 2024-04-21 ENCOUNTER — Telehealth: Payer: Self-pay

## 2024-04-21 ENCOUNTER — Inpatient Hospital Stay

## 2024-04-21 ENCOUNTER — Ambulatory Visit (INDEPENDENT_AMBULATORY_CARE_PROVIDER_SITE_OTHER): Payer: Medicare Other | Admitting: Neurology

## 2024-04-21 ENCOUNTER — Ambulatory Visit (INDEPENDENT_AMBULATORY_CARE_PROVIDER_SITE_OTHER)

## 2024-04-21 ENCOUNTER — Other Ambulatory Visit: Payer: Self-pay

## 2024-04-21 VITALS — BP 132/78 | HR 78 | Ht 72.0 in | Wt 249.6 lb

## 2024-04-21 DIAGNOSIS — F419 Anxiety disorder, unspecified: Secondary | ICD-10-CM | POA: Diagnosis not present

## 2024-04-21 DIAGNOSIS — E538 Deficiency of other specified B group vitamins: Secondary | ICD-10-CM

## 2024-04-21 DIAGNOSIS — C9110 Chronic lymphocytic leukemia of B-cell type not having achieved remission: Secondary | ICD-10-CM

## 2024-04-21 DIAGNOSIS — G20A1 Parkinson's disease without dyskinesia, without mention of fluctuations: Secondary | ICD-10-CM | POA: Diagnosis not present

## 2024-04-21 LAB — CBC WITH DIFFERENTIAL (CANCER CENTER ONLY)
Abs Immature Granulocytes: 0.2 10*3/uL — ABNORMAL HIGH (ref 0.00–0.07)
Basophils Absolute: 0.1 10*3/uL (ref 0.0–0.1)
Basophils Relative: 0 %
Eosinophils Absolute: 0.2 10*3/uL (ref 0.0–0.5)
Eosinophils Relative: 0 %
HCT: 42.5 % (ref 39.0–52.0)
Hemoglobin: 14.3 g/dL (ref 13.0–17.0)
Immature Granulocytes: 0 %
Lymphocytes Relative: 79 %
Lymphs Abs: 56.7 10*3/uL — ABNORMAL HIGH (ref 0.7–4.0)
MCH: 27.9 pg (ref 26.0–34.0)
MCHC: 33.6 g/dL (ref 30.0–36.0)
MCV: 82.8 fL (ref 80.0–100.0)
Monocytes Absolute: 8.3 10*3/uL — ABNORMAL HIGH (ref 0.1–1.0)
Monocytes Relative: 12 %
Neutro Abs: 6.4 10*3/uL (ref 1.7–7.7)
Neutrophils Relative %: 9 %
Platelet Count: 166 10*3/uL (ref 150–400)
RBC: 5.13 MIL/uL (ref 4.22–5.81)
RDW: 15.4 % (ref 11.5–15.5)
Smear Review: NORMAL
WBC Count: 71.8 10*3/uL (ref 4.0–10.5)
nRBC: 0 % (ref 0.0–0.2)

## 2024-04-21 LAB — CMP (CANCER CENTER ONLY)
ALT: 19 U/L (ref 0–44)
AST: 19 U/L (ref 15–41)
Albumin: 4.6 g/dL (ref 3.5–5.0)
Alkaline Phosphatase: 50 U/L (ref 38–126)
Anion gap: 5 (ref 5–15)
BUN: 16 mg/dL (ref 8–23)
CO2: 31 mmol/L (ref 22–32)
Calcium: 9.3 mg/dL (ref 8.9–10.3)
Chloride: 104 mmol/L (ref 98–111)
Creatinine: 1.04 mg/dL (ref 0.61–1.24)
GFR, Estimated: >60 mL/min (ref >60)
Glucose, Bld: 151 mg/dL — ABNORMAL HIGH (ref 70–99)
Potassium: 4.3 mmol/L (ref 3.5–5.1)
Sodium: 140 mmol/L (ref 135–145)
Total Bilirubin: 1.4 mg/dL — ABNORMAL HIGH (ref 0.0–1.2)
Total Protein: 6.4 g/dL — ABNORMAL LOW (ref 6.5–8.1)

## 2024-04-21 MED ORDER — CYANOCOBALAMIN 1000 MCG/ML IJ SOLN
1000.0000 ug | Freq: Once | INTRAMUSCULAR | Status: AC
Start: 2024-04-21 — End: 2024-04-21
  Administered 2024-04-21: 1000 ug via INTRAMUSCULAR

## 2024-04-21 NOTE — Telephone Encounter (Signed)
 CRITICAL VALUE STICKER  CRITICAL VALUE: WBC 71.8    RECEIVER (on-site recipient of call): Cleda Curly CMA  DATE & TIME NOTIFIED: 1100 04/21/2024  MESSENGER (representative from lab): Melissa in lab  MD NOTIFIED: Sonja Peetz  TIME OF NOTIFICATION: 1110  RESPONSE:  made physician aware

## 2024-04-21 NOTE — Progress Notes (Signed)
 Patient is in office today for a nurse visit for B12 Injection. Patient Injection was given in the  Left deltoid. Patient tolerated injection well.

## 2024-04-21 NOTE — Telephone Encounter (Signed)
 Pt and spouse are requesting if one of the providers would please give them a call to discuss the pt's lab results from today 04/21/2024 once they result in EPIC.  Notified Dr. Maryalice Smaller and her Team of the pt's request.

## 2024-04-21 NOTE — Patient Instructions (Signed)
 We discussed:  1.  Physical therapy - this is the greatest thing for your help  2.  MRI brain but you decided to hold on that for now.  We discussed CT brain as well but you decided to hold on that for now  3.  We discussed restarting carbidopa /levodopa ; it won't help the tremor but it does help us  get up and down and balance.

## 2024-04-22 ENCOUNTER — Other Ambulatory Visit: Payer: Self-pay

## 2024-04-22 DIAGNOSIS — G20A1 Parkinson's disease without dyskinesia, without mention of fluctuations: Secondary | ICD-10-CM

## 2024-04-22 MED ORDER — CARBIDOPA-LEVODOPA 25-100 MG PO TABS: 1.0000 | ORAL_TABLET | Freq: Three times a day (TID) | ORAL | 1 refills | Status: DC

## 2024-04-30 ENCOUNTER — Encounter (INDEPENDENT_AMBULATORY_CARE_PROVIDER_SITE_OTHER): Admitting: Ophthalmology

## 2024-04-30 DIAGNOSIS — H35033 Hypertensive retinopathy, bilateral: Secondary | ICD-10-CM

## 2024-04-30 DIAGNOSIS — H353231 Exudative age-related macular degeneration, bilateral, with active choroidal neovascularization: Secondary | ICD-10-CM | POA: Diagnosis not present

## 2024-04-30 DIAGNOSIS — I10 Essential (primary) hypertension: Secondary | ICD-10-CM | POA: Diagnosis not present

## 2024-04-30 DIAGNOSIS — H43813 Vitreous degeneration, bilateral: Secondary | ICD-10-CM | POA: Diagnosis not present

## 2024-05-11 ENCOUNTER — Ambulatory Visit: Payer: Medicare Other | Admitting: Family Medicine

## 2024-05-13 ENCOUNTER — Telehealth: Payer: Self-pay | Admitting: Neurology

## 2024-05-13 DIAGNOSIS — G20A1 Parkinson's disease without dyskinesia, without mention of fluctuations: Secondary | ICD-10-CM

## 2024-05-13 DIAGNOSIS — R29898 Other symptoms and signs involving the musculoskeletal system: Secondary | ICD-10-CM

## 2024-05-13 DIAGNOSIS — R251 Tremor, unspecified: Secondary | ICD-10-CM

## 2024-05-13 NOTE — Telephone Encounter (Signed)
 Pt wife called to get the orders for PT her [name]. They are otw back from vacation and she would like that started asap

## 2024-05-13 NOTE — Telephone Encounter (Signed)
 Order placed for home PT ( sent to Shelvy Dickens to see if she can take him) and MRI [place] imaging

## 2024-05-17 ENCOUNTER — Telehealth: Payer: Self-pay | Admitting: Neurology

## 2024-05-17 NOTE — Telephone Encounter (Signed)
 Physical Therapist [facility] [phone], needs verbal orders Pt. Changed date for today 05/17/24. Orders can be given verbally by phone on voicemail

## 2024-05-18 ENCOUNTER — Telehealth: Payer: Self-pay | Admitting: Neurology

## 2024-05-18 ENCOUNTER — Ambulatory Visit (INDEPENDENT_AMBULATORY_CARE_PROVIDER_SITE_OTHER)

## 2024-05-18 DIAGNOSIS — E538 Deficiency of other specified B group vitamins: Secondary | ICD-10-CM | POA: Diagnosis not present

## 2024-05-18 MED ORDER — CYANOCOBALAMIN 1000 MCG/ML IJ SOLN
1000.0000 ug | Freq: Once | INTRAMUSCULAR | Status: AC
  Administered 2024-05-19: 1000 ug via INTRAMUSCULAR

## 2024-05-18 NOTE — Progress Notes (Signed)
.  Patient is in office today for a nurse visit for B12 Injection per Winnie. Patient Injection was given in the  Right deltoid. Patient tolerated injection well.

## 2024-05-18 NOTE — Telephone Encounter (Signed)
 Called [facility] and gave orders to complete home PT

## 2024-05-18 NOTE — Telephone Encounter (Signed)
 [name] with [facility] called in to let Dr. Winferd Hatter know that the patient has now requested they come on Thursday, so there will be a delay at the patient's request.

## 2024-05-19 ENCOUNTER — Ambulatory Visit

## 2024-05-19 DIAGNOSIS — E538 Deficiency of other specified B group vitamins: Secondary | ICD-10-CM | POA: Diagnosis not present

## 2024-05-24 ENCOUNTER — Ambulatory Visit (INDEPENDENT_AMBULATORY_CARE_PROVIDER_SITE_OTHER): Payer: Medicare Other

## 2024-05-24 ENCOUNTER — Telehealth: Payer: Self-pay | Admitting: Family Medicine

## 2024-05-24 VITALS — Ht 72.0 in | Wt 260.0 lb

## 2024-05-24 DIAGNOSIS — Z0000 Encounter for general adult medical examination without abnormal findings: Secondary | ICD-10-CM | POA: Diagnosis not present

## 2024-05-24 NOTE — Progress Notes (Signed)
 Subjective:   [name] is a 83 y.o. who presents for a Medicare Wellness preventive visit.  As a reminder, Annual Wellness Visits don't include a physical exam, and some assessments may be limited, especially if this visit is performed virtually. We may recommend an in-person follow-up visit with your provider if needed.  Visit Complete: Virtual I connected with  [name] on 05/24/24 by a audio enabled telemedicine application and verified that I am speaking with the correct person using two identifiers.  Patient Location: Home  Provider Location: Office/Clinic  I discussed the limitations of evaluation and management by telemedicine. The patient expressed understanding and agreed to proceed.  Vital Signs: Because this visit was a virtual/telehealth visit, some criteria may be missing or patient reported. Any vitals not documented were not able to be obtained and vitals that have been documented are patient reported.  VideoDeclined- This patient declined Librarian, academic. Therefore the visit was completed with audio only.  Persons Participating in Visit: Patient.  AWV Questionnaire: No: Patient Medicare AWV questionnaire was not completed prior to this visit.  Cardiac Risk Factors include: advanced age (>36men, >22 women);dyslipidemia;diabetes mellitus;male gender;hypertension;obesity (BMI >30kg/m2)     Objective:    Today's Vitals   05/24/24 1125  Weight: 260 lb (117.9 kg)  Height: 6' (1.829 m)   Body mass index is 35.26 kg/m.     05/24/2024   11:30 AM 04/21/2024    2:49 PM 09/05/2023    8:18 AM 08/09/2023    2:08 AM 08/08/2023    6:30 PM 05/19/2023   10:41 AM 10/17/2022   10:02 AM  Advanced Directives  Does Patient Have a Medical Advance Directive? Yes Yes Yes  No Yes Yes  Type of Estate agent of [place];Living will Living will Living will   Healthcare Power of [place];Living will Living will  Does patient  want to make changes to medical advance directive? No - Patient declined     No - Patient declined   Copy of Healthcare Power of Attorney in Chart? Yes - validated most recent copy scanned in chart (See row information)     Yes - validated most recent copy scanned in chart (See row information)   Would patient like information on creating a medical advance directive?    No - Patient declined       Current Medications (verified) Outpatient Encounter Medications as of 05/24/2024  Medication Sig   amLODipine  (NORVASC ) 10 MG tablet Take 10 mg by mouth daily.   busPIRone  (BUSPAR ) 10 MG tablet Take by mouth 3 (three) times daily.   carbidopa -levodopa  (SINEMET  IR) 25-100 MG tablet Take 1 tablet by mouth 3 (three) times daily.   Cholecalciferol (VITAMIN D3) 50 MCG (2000 UT) TABS Take 1 tablet by mouth daily.   empagliflozin  (JARDIANCE ) 25 MG TABS tablet Take 1 tablet (25 mg total) by mouth daily before breakfast. (G[facility])   escitalopram (LEXAPRO) 10 MG tablet Take 10 mg by mouth daily.   fenofibrate (TRICOR) 145 MG tablet Take 160 mg by mouth daily.   gabapentin  (NEURONTIN ) 300 MG capsule Take 600 mg by mouth 3 (three) times daily.   glucose blood test strip Use to test 4 times daily. E11.9   hydrochlorothiazide (HYDRODIURIL) 25 MG tablet Take 25 mg by mouth daily.   insulin  aspart (NOVOLOG ) 100 UNIT/ML injection Inject 35 Units into the skin 3 (three) times daily with meals. When sugar is elevated   insulin  glargine (LANTUS )  100 UNIT/ML injection Inject 50 Units into the skin at bedtime.   levothyroxine  (SYNTHROID , LEVOTHROID) 50 MCG tablet Take 50 mcg by mouth daily before breakfast.   losartan  (COZAAR ) 100 MG tablet Take 100 mg by mouth daily.   mirtazapine  (REMERON ) 15 MG tablet Take 15 mg by mouth at bedtime.   neomycin -polymyxin b-dexamethasone  (MAXITROL ) 3.5-10000-0.1 SUSP Place 1 drop into both eyes 3 (three) times daily.   OVER THE COUNTER MEDICATION PreserVision for eyes   potassium  chloride SA (K-DUR,KLOR-CON ) 20 MEQ tablet Take 20 mEq by mouth daily.   propranolol  (INDERAL ) 40 MG tablet Take 40 mg by mouth 3 (three) times daily.   simvastatin  (ZOCOR ) 80 MG tablet Take 40 mg by mouth daily at 6 PM. Take half tablet (40 mg) by mouth once daily   sucralfate  (CARAFATE ) 1 G tablet Take 1 g by mouth 4 (four) times daily -  with meals and at bedtime.   terbinafine  (LAMISIL ) 1 % cream Apply 1 Application topically 2 (two) times daily.   traMADol  (ULTRAM ) 50 MG tablet Take 1 tablet (50 mg total) by mouth every 6 (six) hours as needed for moderate pain (pain score 4-6).   zolpidem  (AMBIEN ) 5 MG tablet Take 1 tablet (5 mg total) by mouth at bedtime as needed for sleep.   No facility-administered encounter medications on file as of 05/24/2024.    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 Thought to be agent orange related-CLL and prostate cancer  Diagnosis 1. 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 - NO EVIDENCE OF EXTRAPROSTATIC EXTENSION, ANGIOLYMPHATIC INVASION OR SEMINAL VESICLE INVOLVEMENT IDENTIFIED. - RESECTION MARGINS, NEGATIVE FOR ATYPIA OR MALIGNANCY. 2. Lymph nodes, regional resection, righ   Chronic pain syndrome 02/01/2016   Chronic right hip pain 09/27/2019   DDD (degenerative disc disease), lumbar    Diabetic retinopathy 09/24/2007   Diverticulosis of colon    Elevated PSA    Erectile dysfunction 12/30/2007   No rx.      GERD (gastroesophageal reflux disease)    Gynecomastia 12/28/2020   History of colonic polyps 08/22/2008   2004 3 adenomas 2009 none 2013 none 02/26/2017 8 mm sigmoid polyp was inflammatory - no recall  planned given age/co-morbidities overall hx of polyps     History of prostate cancer 01/24/2014   Follows with Dr. Brunetta every 3 months. Dr. Amadeo every 6 months.  Diagnosis 1. 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 - NO EVIDENCE OF EXTRAPROSTATIC EXTENSION, ANGIOLYMPHATIC INVASION OR SEMINAL VESICLE INVOLVEMENT IDENTIFIED. - RESECTION MARGINS, NEGATIVE FOR ATYPIA OR MALIGNANCY. 2. Lymph nodes, regional resection, right pelvic - SIX   Hyperlipidemia associated with type 2 diabetes mellitus 09/24/2007   High triglycerides. Refused statin due to myalgias in past but then started half tablet of simvastatin  40mg  through [facility] of this note might be different from the original. Formatting of this note might be different from the original. High triglycerides. Refused statin due to myalgias in past but then started half tablet of simvastatin  40mg  through [place]  Last Assessment & Plan:  Formatting    Hypertension associated with diabetes 09/24/2007   Amlodipine  10mg , losartan   100mg , HCTZ 25mg , propranolol  40 mg twice a day Home cuff 142/85 compared to 134/72 on my check. Likely home cuff about 10 points higher.   --> chlorthalidone  03/20/16 but patient didn't change and then BP controlled on next visit   Formatting of this note might be different from the original. Formatting of this note might be different from the original. 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  Follow up in 6 months unless wound issues arise.  Derm follow up in October 2022 scheduled.  I will see him back in 6 months.   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 a1c under 8     Formatting of this note might be different from the original. Formatting of this note might be different from the original. And macular degeneration.   Last Assessment & Plan:  Formatting of this note might be different from the original. S: diabetic retinopathy followed up by optho yesterday and largely stable. He also has m   Vitamin D  deficiency 11/01/2020   At [facility]- on 2000 units a day at least since 2021    Past Surgical History:  Procedure Laterality Date   CHOLECYSTECTOMY     COLONOSCOPY  03/25/12, 09/28/08   ESOPHAGOGASTRODUODENOSCOPY N/A 10/12/2014   Procedure: ESOPHAGOGASTRODUODENOSCOPY (EGD);  Surgeon: Lupita FORBES Commander, MD;  Location: [facility];  Service: Endoscopy;  Laterality: N/A;   ESOPHAGOGASTRODUODENOSCOPY ENDOSCOPY  09/28/08   EXCISION MELANOMA WITH SENTINEL LYMPH NODE BIOPSY Right 05/31/2021   Procedure: WIDE LOCAL EXCISION RIGHT FOREARM WITH ADVANCEDMENT FLAP CLOSURE WITH SENTINEL LYMPH NODE MAPPING AND BIOSPY;  Surgeon: Aron Shoulders, MD;  Location: [facility];  Service: General;  Laterality: Right;   FOOT SURGERY Bilateral    HEMORRHOID SURGERY     LAPAROSCOPIC GASTRIC BANDING  03/05/11   weight loss   LAPAROSCOPIC GASTRIC BANDING WITH HIATAL HERNIA REPAIR  03/05/2011   LYMPHADENECTOMY Bilateral 01/24/2014   Procedure: LYMPHADENECTOMY;  Surgeon: Noretta Ferrara, MD;  Location: [facility];  Service: Urology;  Laterality: Bilateral;   ORIF TIBIA FRACTURE  Right    PENILE PROSTHESIS IMPLANT     PROSTATE SURGERY  01/2014   ROBOT ASSISTED LAPAROSCOPIC RADICAL PROSTATECTOMY N/A 01/24/2014   Procedure: ROBOTIC ASSISTED LAPAROSCOPIC RADICAL PROSTATECTOMY LEVEL 3;  Surgeon: Noretta Ferrara, MD;  Location: [facility];  Service: Urology;  Laterality: N/A;   SHOULDER SURGERY Right 2011   TONSILLECTOMY     age 87   VASECTOMY     Family History  Problem Relation Age of Onset   Hypertension Mother    Stomach cancer Mother 59   Alcohol abuse Brother  Diabetes Child    Hypertension Child    Hypertension Paternal Uncle    Heart attack Paternal Uncle    Parkinson's disease Other        several cousins and uncles   Colon cancer Neg Hx    Colon polyps Neg Hx    Rectal cancer Neg Hx    Social History   Socioeconomic History   Marital status: Married    Spouse name: Not on file   Number of children: 5   Years of education: 14   Highest education level: Associate degree: occupational, Scientist, product/process development, or vocational program  Occupational History   Occupation: retired    Associate Professor: RETIRED    Comment: army x 2 years; Designer, fashion/clothing; Personnel officer  Tobacco Use   Smoking status: Former    Current packs/day: 0.00    Average packs/day: 1 pack/day for 20.0 years (20.0 ttl pk-yrs)    Types: Cigarettes    Start date: 02/25/1961    Quit date: 02/25/1981    Years since quitting: 43.2   Smokeless tobacco: Never  Vaping Use   Vaping status: Never Used  Substance and Sexual Activity   Alcohol use: Not Currently    Comment: stopped drinking 76 or 77    Drug use: No   Sexual activity: Yes    Comment: Been able to utilize his penile prosthesis successfully  Other Topics Concern   Not on file  Social History Narrative   Married. 3 children from previous marriage, 2 step children. 15 grandkids.       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 could not, watch tv ([employer])   Social Drivers of Corporate investment banker Strain: Low  Risk  (05/24/2024)   Overall Financial Resource Strain (CARDIA)    Difficulty of Paying Living Expenses: Not hard at all  Food Insecurity: No Food Insecurity (05/24/2024)   Hunger Vital Sign    Worried About Running Out of Food in the Last Year: Never true    Ran Out of Food in the Last Year: Never true  Transportation Needs: No Transportation Needs (05/24/2024)   PRAPARE - Administrator, Civil Service (Medical): No    Lack of Transportation (Non-Medical): No  Physical Activity: Insufficiently Active (05/24/2024)   Exercise Vital Sign    Days of Exercise per Week: 3 days    Minutes of Exercise per Session: 40 min  Stress: No Stress Concern Present (05/24/2024)   [employer] of Occupational Health - Occupational Stress Questionnaire    Feeling of Stress: Not at all  Social Connections: Socially Integrated (05/24/2024)   Social Connection and Isolation Panel    Frequency of Communication with Friends and Family: Twice a week    Frequency of Social Gatherings with Friends and Family: Twice a week    Attends Religious Services: More than 4 times per year    Active Member of [employer] or Organizations: Yes    Attends Banker Meetings: 1 to 4 times per year    Marital Status: Married    Tobacco Counseling Counseling given: Not Answered    Clinical Intake:  Pre-visit preparation completed: Yes  Pain : No/denies pain     BMI - recorded: 35.26 Nutritional Status: BMI > 30  Obese Diabetes: Yes CBG done?: Yes (150 per pt) CBG resulted in Enter/ Edit results?: No Did pt. bring in CBG monitor from home?: No  Lab Results  Component Value Date  HGBA1C 7.7 04/07/2024   HGBA1C 7.5 (H) 01/28/2024   HGBA1C 7.1 (H) 08/19/2023     How often do you need to have someone help you when you read instructions, pamphlets, or other written materials from your doctor or pharmacy?: 1 - Never  Interpreter Needed?: No  Information entered by :: Ellouise Haws,  LPN   Activities of Daily Living      05/24/2024   11:28 AM 08/09/2023    2:08 AM  In your present state of health, do you have any difficulty performing the following activities:  Hearing? 1 0  Comment hoh   Vision? 0 0  Difficulty concentrating or making decisions? 0 0  Walking or climbing stairs? 1 1  Comment walker and cane   Dressing or bathing? 0 1  Doing errands, shopping? 0 0  Preparing Food and eating ? N   Using the Toilet? N   In the past six months, have you accidently leaked urine? Y   Comment wears depends   Do you have problems with loss of bowel control? N   Managing your Medications? Y   Comment wife assist   Managing your Finances? N   Housekeeping or managing your Housekeeping? Y     Patient Care Team: Katrinka Garnette KIDD, MD as PCP - General (Family Medicine) Alvia Norleen BIRCH, MD as Consulting Physician (Ophthalmology) Livingston Rigg, MD (Inactive) as Consulting Physician (Dermatology) Nicholaus Sherlean CROME, [facility] (Inactive) (Pharmacist) Lanny Callander, MD as Consulting Physician (Hematology and Oncology) Kate Lonni CROME, MD as Consulting Physician (Cardiology)   I have updated your Care Teams any recent Medical Services you may have received from other providers in the past year.     Assessment:   This is a routine wellness examination for Hamza.  Hearing/Vision screen Hearing Screening - Comments:: HOH  Vision Screening - Comments:: Wears rx glasses - up to date with routine eye exams with Dr Alvia     Goals Addressed             This Visit's Progress    Patient Stated       Maintain health and activity        Depression Screen      05/24/2024   11:30 AM 01/28/2024    8:02 AM 08/19/2023    7:51 AM 05/19/2023   10:40 AM 04/15/2023    7:43 AM 11/20/2022   10:40 AM 08/22/2022    8:02 AM  PHQ 2/9 Scores  PHQ - 2 Score 0 0 4 0 0 0 0  PHQ- 9 Score  0 7 0 0 0 0    Fall Risk      05/24/2024   11:32 AM 04/21/2024    2:49 PM 09/05/2023     8:15 AM 08/19/2023    7:50 AM 05/19/2023   10:41 AM  Fall Risk   Falls in the past year? 0 0 1 1 0  Number falls in past yr: 0 0 0 0 0  Injury with Fall? 0 0 0 0 0  Risk for fall due to : Impaired balance/gait;Impaired mobility   History of fall(s) No Fall Risks  Follow up Falls prevention discussed Falls evaluation completed Falls evaluation completed Falls evaluation completed Falls prevention discussed    MEDICARE RISK AT HOME:   Medicare Risk at Home Any stairs in or around the home?: No If so, are there any without handrails?: No Home free of loose throw rugs in walkways, pet beds, electrical cords, etc?: Yes  Adequate lighting in your home to reduce risk of falls?: Yes Life alert?: Yes Use of a cane, walker or w/c?: Yes Grab bars in the bathroom?: Yes Shower chair or bench in shower?: Yes Elevated toilet seat or a handicapped toilet?: Yes  TIMED UP AND GO:  Was the test performed?  No  Cognitive Function: 6CIT completed        05/24/2024   11:32 AM 05/19/2023   10:43 AM 11/13/2021   11:44 AM 12/18/2017   10:41 AM  6CIT Screen  What Year? 0 points 0 points 0 points 0 points  What month? 0 points 0 points 0 points 0 points  What time? 0 points 0 points 0 points 0 points  Count back from 20 0 points 0 points 0 points 0 points  Months in reverse 0 points 0 points 0 points 0 points  Repeat phrase 0 points 0 points 0 points 0 points  Total Score 0 points 0 points 0 points 0 points    Immunizations Immunization History  Administered Date(s) Administered   Fluad Quad(high Dose 65+) 07/29/2019, 08/08/2020, 08/24/2022   Hep A / Hep B 07/29/2011, 01/27/2012   Hepatitis B 08/26/2011   Influenza Split 09/18/2012   Influenza Whole 09/22/2007, 09/01/2009, 08/31/2010   Influenza, High Dose Seasonal PF 08/13/2017, 07/30/2018, 08/21/2022   Influenza,inj,Quad PF,6+ Mos 08/11/2013, 08/31/2014, 08/11/2015, 09/19/2021   Influenza-Unspecified 08/20/2016   PFIZER Comirnaty(Gray  Top)Covid-19 Tri-Sucrose Vaccine 08/21/2022   PFIZER(Purple Top)SARS-COV-2 Vaccination 12/13/2019, 01/16/2020, 07/26/2020, 03/19/2021, 08/25/2021   PNEUMOCOCCAL CONJUGATE-20 05/04/2021   Pfizer Covid-19 Vaccine Bivalent Booster 76yrs & up 08/21/2022   Pfizer(Comirnaty)Fall Seasonal Vaccine 12 years and older 08/07/2023   Pneumococcal Conjugate-13 09/06/2014   Pneumococcal Polysaccharide-23 12/02/2008   Respiratory Syncytial Virus Vaccine,Recomb Aduvanted(Arexvy) 09/23/2022   Td 05/07/2007   Tdap 08/31/2014, 01/12/2024   Zoster Recombinant(Shingrix) 03/30/2018, 01/27/2020   Zoster, Live 12/03/2007    Screening Tests Health Maintenance  Topic Date Due   FOOT EXAM  08/23/2023   COVID-19 Vaccine (8 - Pfizer risk 2024-25 season) 02/04/2024   OPHTHALMOLOGY EXAM  02/26/2024   INFLUENZA VACCINE  07/02/2024   HEMOGLOBIN A1C  10/08/2024   Diabetic kidney evaluation - Urine ACR  10/20/2024   Diabetic kidney evaluation - eGFR measurement  04/21/2025   Medicare Annual Wellness (AWV)  05/24/2025   DTaP/Tdap/Td (4 - Td or Tdap) 01/11/2034   Pneumococcal Vaccine: 50+ Years  Completed   Zoster Vaccines- Shingrix  Completed   HPV VACCINES  Aged Out   Meningococcal B Vaccine  Aged Out   Colonoscopy  Discontinued   Hepatitis C Screening  Discontinued    Health Maintenance  Health Maintenance Due  Topic Date Due   FOOT EXAM  08/23/2023   COVID-19 Vaccine (8 - Pfizer risk 2024-25 season) 02/04/2024   OPHTHALMOLOGY EXAM  02/26/2024   Health Maintenance Items Addressed: See Nurse Notes at the end of this note  Additional Screening:  Vision Screening: Recommended annual ophthalmology exams for early detection of glaucoma and other disorders of the eye. Would you like a referral to an eye doctor? No    Dental Screening: Recommended annual dental exams for proper oral hygiene  Community Resource Referral / Chronic Care Management: CRR required this visit?  No   CCM required this visit?   No   Plan:    I have personally reviewed and noted the following in the patient's chart:   Medical and social history Use of alcohol, tobacco or illicit drugs  Current medications and supplements  including opioid prescriptions. Patient is currently taking opioid prescriptions. Information provided to patient regarding non-opioid alternatives. Patient advised to discuss non-opioid treatment plan with their provider. Functional ability and status Nutritional status Physical activity Advanced directives List of other physicians Hospitalizations, surgeries, and ER visits in previous 12 months Vitals Screenings to include cognitive, depression, and falls Referrals and appointments  In addition, I have reviewed and discussed with patient certain preventive protocols, quality metrics, and best practice recommendations. A written personalized care plan for preventive services as well as general preventive health recommendations were provided to patient.   Ellouise VEAR Haws, LPN   [id number]   After Visit Summary: (MyChart) Due to this being a telephonic visit, the after visit summary with patients personalized plan was offered to patient via MyChart   Notes: Nothing significant to report at this time.

## 2024-05-24 NOTE — Telephone Encounter (Signed)
 LVM to r/s cancelled appointment from 05/11/24 per message from [place], [place] Wellness Coach

## 2024-05-24 NOTE — Patient Instructions (Signed)
 [name] , Thank you for taking time out of your busy schedule to complete your Annual Wellness Visit with me. I enjoyed our conversation and look forward to speaking with you again next year. I, as well as your care team,  appreciate your ongoing commitment to your health goals. Please review the following plan we discussed and let me know if I can assist you in the future. Your Game plan/ To Do List    Referrals: If you haven't heard from the office you've been referred to, please reach out to them at the phone provided.   Follow up Visits: Next Medicare AWV with our clinical staff: 05/30/25   Have you seen your provider in the last 6 months (3 months if uncontrolled diabetes)? No Next Office Visit with your provider: will need to be scheduled front desk aware   Clinician Recommendations:Each day, aim for 6 glasses of water , plenty of protein in your diet and try to get up and walk/ stretch every hour for 5-10 minutes at a time.        This is a list of the screening recommended for you and due dates:  Health Maintenance  Topic Date Due   Complete foot exam   08/23/2023   COVID-19 Vaccine (8 - Pfizer risk 2024-25 season) 02/04/2024   Eye exam for diabetics  02/26/2024   Medicare Annual Wellness Visit  05/18/2024   Flu Shot  07/02/2024   Hemoglobin A1C  10/08/2024   Yearly kidney health urinalysis for diabetes  10/20/2024   Yearly kidney function blood test for diabetes  04/21/2025   DTaP/Tdap/Td vaccine (4 - Td or Tdap) 01/11/2034   Pneumococcal Vaccine for age over 23  Completed   Zoster (Shingles) Vaccine  Completed   HPV Vaccine  Aged Out   Meningitis B Vaccine  Aged Out   Colon Cancer Screening  Discontinued   Hepatitis C Screening  Discontinued    Advanced directives: (In Chart) A copy of your advanced directives are scanned into your chart should your provider ever need it. Advance Care Planning is important because it:  [x]  Makes sure you receive the medical care that  is consistent with your values, goals, and preferences  [x]  It provides guidance to your family and loved ones and reduces their decisional burden about whether or not they are making the right decisions based on your wishes.  Follow the link provided in your after visit summary or read over the paperwork we have mailed to you to help you started getting your Advance Directives in place. If you need assistance in completing these, please reach out to us  so that we can help you!  See attachments for Preventive Care and Fall Prevention Tips.

## 2024-05-25 ENCOUNTER — Encounter (HOSPITAL_COMMUNITY): Payer: Self-pay

## 2024-05-25 ENCOUNTER — Other Ambulatory Visit: Payer: Self-pay

## 2024-05-25 ENCOUNTER — Emergency Department (HOSPITAL_COMMUNITY)
Admission: EM | Admit: 2024-05-25 | Discharge: 2024-05-25 | Disposition: A | Discharge status: Home / Self Care | Attending: Emergency Medicine | Admitting: Emergency Medicine

## 2024-05-25 DIAGNOSIS — E875 Hyperkalemia: Secondary | ICD-10-CM | POA: Insufficient documentation

## 2024-05-25 DIAGNOSIS — S20461D Insect bite (nonvenomous) of right back wall of thorax, subsequent encounter: Secondary | ICD-10-CM | POA: Diagnosis not present

## 2024-05-25 DIAGNOSIS — R739 Hyperglycemia, unspecified: Secondary | ICD-10-CM | POA: Insufficient documentation

## 2024-05-25 DIAGNOSIS — S0096XD Insect bite (nonvenomous) of unspecified part of head, subsequent encounter: Secondary | ICD-10-CM | POA: Diagnosis not present

## 2024-05-25 DIAGNOSIS — Z856 Personal history of leukemia: Secondary | ICD-10-CM | POA: Diagnosis not present

## 2024-05-25 DIAGNOSIS — W57XXXD Bitten or stung by nonvenomous insect and other nonvenomous arthropods, subsequent encounter: Secondary | ICD-10-CM | POA: Insufficient documentation

## 2024-05-25 DIAGNOSIS — W57XXXA Bitten or stung by nonvenomous insect and other nonvenomous arthropods, initial encounter: Secondary | ICD-10-CM

## 2024-05-25 DIAGNOSIS — D72829 Elevated white blood cell count, unspecified: Secondary | ICD-10-CM | POA: Diagnosis not present

## 2024-05-25 DIAGNOSIS — S20369D Insect bite (nonvenomous) of unspecified front wall of thorax, subsequent encounter: Secondary | ICD-10-CM | POA: Diagnosis not present

## 2024-05-25 LAB — CBC WITH DIFFERENTIAL/PLATELET
Abs Immature Granulocytes: 0.19 10*3/uL — ABNORMAL HIGH (ref 0.00–0.07)
Basophils Absolute: 0.5 10*3/uL — ABNORMAL HIGH (ref 0.0–0.1)
Basophils Relative: 1 %
Eosinophils Absolute: 0.4 10*3/uL (ref 0.0–0.5)
Eosinophils Relative: 1 %
HCT: 43.9 % (ref 39.0–52.0)
Hemoglobin: 14.6 g/dL (ref 13.0–17.0)
Immature Granulocytes: 0 %
Lymphocytes Relative: 74 %
Lymphs Abs: 63.5 10*3/uL — ABNORMAL HIGH (ref 0.7–4.0)
MCH: 29.4 pg (ref 26.0–34.0)
MCHC: 33.3 g/dL (ref 30.0–36.0)
MCV: 88.5 fL (ref 80.0–100.0)
Monocytes Absolute: 14.2 10*3/uL — ABNORMAL HIGH (ref 0.1–1.0)
Monocytes Relative: 17 %
Neutro Abs: 5.6 10*3/uL (ref 1.7–7.7)
Neutrophils Relative %: 7 %
Platelets: 168 10*3/uL (ref 150–400)
RBC: 4.96 MIL/uL (ref 4.22–5.81)
RDW: 15.2 % (ref 11.5–15.5)
WBC: 84.3 10*3/uL (ref 4.0–10.5)
nRBC: 0 % (ref 0.0–0.2)

## 2024-05-25 LAB — BASIC METABOLIC PANEL WITH GFR
Anion gap: 11 (ref 5–15)
BUN: 18 mg/dL (ref 8–23)
CO2: 24 mmol/L (ref 22–32)
Calcium: 9.1 mg/dL (ref 8.9–10.3)
Chloride: 103 mmol/L (ref 98–111)
Creatinine, Ser: 1.05 mg/dL (ref 0.61–1.24)
GFR, Estimated: >60 mL/min (ref >60)
Glucose, Bld: 173 mg/dL — ABNORMAL HIGH (ref 70–99)
Potassium: 5.7 mmol/L — ABNORMAL HIGH (ref 3.5–5.1)
Sodium: 138 mmol/L (ref 135–145)

## 2024-05-25 MED ORDER — DOXYCYCLINE HYCLATE 100 MG PO CAPS: 100.0000 mg | ORAL_CAPSULE | Freq: Two times a day (BID) | ORAL | 0 refills | Status: AC

## 2024-05-25 MED ORDER — SODIUM ZIRCONIUM CYCLOSILICATE 10 G PO PACK
10.0000 g | PACK | Freq: Once | ORAL | Status: AC
  Administered 2024-05-25: 10 g via ORAL
  Filled 2024-05-25: qty 1

## 2024-05-25 NOTE — Discharge Instructions (Addendum)
 Hold your potassium for a week, take Doxycycline  for 10 days and follow-up with oncology outpatient.  Your potassium was mildly elevated and your white count has doubled since September of last year.  Please follow-up closely with oncology

## 2024-05-25 NOTE — ED Provider Triage Note (Signed)
 Emergency Medicine Provider Triage Evaluation Note  [name] , a 83 y.o. male  was evaluated in triage.  Pt complains of sick that is present on his right torso.  This was removed in triage.  It appears the entire tick has been removed including the head.  This was placed in a specimen cup and given to patient's wife.  They would like to have some blood work performed.  Review of Systems  Positive: As above Negative: As above  Physical Exam  BP 125/78 (BP Location: Right Arm)   Pulse (!) 55   Temp 98.8 F (37.1 C)   Resp 18   Ht 6' (1.829 m)   Wt 117.9 kg   SpO2 97%   BMI 35.26 kg/m  Gen:   Awake, no distress   Resp:  Normal effort  MSK:   Moves extremities without difficulty  Other:    Medical Decision Making  Medically screening exam initiated at 3:16 PM.  Appropriate orders placed.  [name] was informed that the remainder of the evaluation will be completed by another provider, this initial triage assessment does not replace that evaluation, and the importance of remaining in the ED until their evaluation is complete.    Hildegard Loge, PA-C 05/25/24 1517

## 2024-05-25 NOTE — ED Provider Notes (Signed)
 [place] EMERGENCY DEPARTMENT AT [facility] Provider Note   CSN: [id number] Arrival date & time: 05/25/24  1421     Patient presents with: Tick Removal   [name] is a 83 y.o. male.   HPI   83 year old male with medical history significant for CLL managed by Dr. Ileana of oncology hematology outpatient, presenting to the emergency department with concern for a tick on his torso.  The patient states that he had a tick on his torso for an unknown duration.  He denies any rash, denies any fevers or chills or muscle aches.  The tick was removed in triage by the triage PA.  Prior to Admission medications   Medication Sig Start Date End Date Taking? Authorizing Provider  doxycycline  (VIBRAMYCIN ) 100 MG capsule Take 1 capsule (100 mg total) by mouth 2 (two) times daily for 10 days. 05/25/24 06/04/24 Yes David Agent, MD  amLODipine  (NORVASC ) 10 MG tablet Take 10 mg by mouth daily. 09/18/20   [provider]  busPIRone  (BUSPAR ) 10 MG tablet Take by mouth 3 (three) times daily. 11/09/21   [provider]  carbidopa -levodopa  (SINEMET  IR) 25-100 MG tablet Take 1 tablet by mouth 3 (three) times daily. 04/22/24   Tat, Asberry RAMAN, DO  Cholecalciferol (VITAMIN D3) 50 MCG (2000 UT) TABS Take 1 tablet by mouth daily.    [provider]  empagliflozin  (JARDIANCE ) 25 MG TABS tablet Take 1 tablet (25 mg total) by mouth daily before breakfast. ([facility]) 06/11/22   Katrinka Garnette KIDD, MD  escitalopram (LEXAPRO) 10 MG tablet Take 10 mg by mouth daily.    [provider]  fenofibrate (TRICOR) 145 MG tablet Take 160 mg by mouth daily.    [provider]  gabapentin  (NEURONTIN ) 300 MG capsule Take 600 mg by mouth 3 (three) times daily. 02/21/22   [provider]  glucose blood test strip Use to test 4 times daily. E11.9 10/26/19   Katrinka Garnette KIDD, MD  hydrochlorothiazide (HYDRODIURIL) 25 MG tablet Take 25 mg by mouth daily.    [provider]  insulin  aspart (NOVOLOG ) 100 UNIT/ML injection Inject 35 Units into the skin 3 (three) times daily with meals. When sugar is elevated    [provider]  insulin  glargine (LANTUS ) 100 UNIT/ML injection Inject 50 Units into the skin at bedtime.    [provider]  levothyroxine  (SYNTHROID , LEVOTHROID) 50 MCG tablet Take 50 mcg by mouth daily before breakfast.    [provider]  losartan  (COZAAR ) 100 MG tablet Take 100 mg by mouth daily.    [provider]  mirtazapine  (REMERON ) 15 MG tablet Take 15 mg by mouth at bedtime.    [provider]  neomycin -polymyxin b-dexamethasone  (MAXITROL ) 3.5-10000-0.1 SUSP Place 1 drop into both eyes 3 (three) times daily. 07/17/23   [provider]  OVER THE COUNTER MEDICATION PreserVision for eyes    [provider]  potassium chloride  SA (K-DUR,KLOR-CON ) 20 MEQ tablet Take 20 mEq by mouth daily.    [provider]  propranolol  (INDERAL ) 40 MG tablet Take 40 mg by mouth 3 (three) times daily.    [provider]  simvastatin  (ZOCOR ) 80 MG tablet Take 40 mg by mouth daily at 6 PM. Take half tablet (40 mg) by mouth once daily 09/18/20   [provider]  sucralfate  (CARAFATE ) 1 G tablet Take 1 g by mouth 4 (four) times daily -  with meals and at bedtime.  [provider]  terbinafine  (LAMISIL ) 1 % cream Apply 1 Application topically 2 (two) times daily.    [provider]  traMADol  (ULTRAM ) 50 MG tablet Take 1 tablet (50 mg total) by mouth every 6 (six) hours as needed for moderate pain (pain score 4-6). 01/28/24   Katrinka Garnette KIDD, MD  zolpidem  (AMBIEN ) 5 MG tablet Take 1 tablet (5 mg total) by mouth at bedtime as needed for sleep. 01/28/24   Katrinka Garnette KIDD, MD    Allergies: Morphine sulfate    Review of Systems  All other systems reviewed and are negative.   Updated Vital Signs BP 103/78   Pulse (!) 52   Temp 98 F (36.7 C) (Oral)    Resp 18   Ht 6' (1.829 m)   Wt 117.9 kg   SpO2 98%   BMI 35.26 kg/m   Physical Exam Vitals and nursing note reviewed.  Constitutional:      General: He is not in acute distress. HENT:     Head: Normocephalic and atraumatic.   Eyes:     Conjunctiva/sclera: Conjunctivae normal.     Pupils: Pupils are equal, round, and reactive to light.    Cardiovascular:     Rate and Rhythm: Normal rate and regular rhythm.  Pulmonary:     Effort: Pulmonary effort is normal. No respiratory distress.  Abdominal:     General: There is no distension.     Tenderness: There is no guarding.   Musculoskeletal:        General: No deformity or signs of injury.     Cervical back: Neck supple.     Comments: Right flank slight uptake removal without significant erythema, mild bleeding, hemostatic   Skin:    Findings: No lesion or rash.   Neurological:     General: No focal deficit present.     Mental Status: He is alert. Mental status is at baseline.     (all labs ordered are listed, but only abnormal results are displayed) Labs Reviewed  CBC WITH DIFFERENTIAL/PLATELET - Abnormal; Notable for the following components:      Result Value   WBC 84.3 (*)    Lymphs Abs 63.5 (*)    Monocytes Absolute 14.2 (*)    Basophils Absolute 0.5 (*)    Abs Immature Granulocytes 0.19 (*)    All other components within normal limits  BASIC METABOLIC PANEL WITH GFR - Abnormal; Notable for the following components:   Potassium 5.7 (*)    Glucose, Bld 173 (*)    All other components within normal limits  PATHOLOGIST SMEAR REVIEW    EKG: None  Radiology: No results found.   Procedures   Medications Ordered in the ED  sodium zirconium cyclosilicate (LOKELMA) packet 10 g (10 g Oral Given 05/25/24 1940)                                    Medical Decision Making Risk Prescription drug management.    84 year old male with medical history significant for CLL managed by Dr. Ileana of oncology  hematology outpatient, presenting to the emergency department with concern for a tick on his torso.  The patient states that he had a tick on his torso for an unknown duration.  He denies any rash, denies any fevers or chills or muscle aches.  The tick was removed in triage by the triage PA.  On arrival, the  patient was afebrile, mildly bradycardic heart rate 55, respiratory rate 18, BP 125/78, saturating 97% on room air.  An entire tick had been removed intact and was notably alive and the specimen bottle bedside.  No evidence of erythema migrans and the patient is afebrile.  Will cover empirically with a course of doxycycline .  Screening laboratory evaluation revealed persistent leukocytosis in the setting of the patient's CLL to 84.3.  He has had doubling of his white blood cell count since September of last year, BMP with mild hyperkalemia 5.7, hyperglycemia to 173, otherwise unremarkable.  I did speak with on-call hematology oncology, Dr. Lonn who recommended closer outpatient follow-up with Dr. Ileana, advised holding the patient's outpatient potassium supplementation for the next week.  Patient administered Lokelma for mild hyperkalemia, advised to hold potassium, prescribe doxycycline  for prophylaxis.  Stable for discharge and close outpatient follow-up.     Final diagnoses:  Tick bite with subsequent removal of tick    ED Discharge Orders          Ordered    doxycycline  (VIBRAMYCIN ) 100 MG capsule  2 times daily        05/25/24 1940               David Agent, MD 05/25/24 2200

## 2024-05-25 NOTE — ED Triage Notes (Signed)
 Large engorged tick noted to right back with redness around tick.  PA removed tick and head was intact on tick.

## 2024-05-26 ENCOUNTER — Encounter: Payer: Self-pay | Admitting: Family Medicine

## 2024-05-26 ENCOUNTER — Encounter: Payer: Self-pay | Admitting: Hematology

## 2024-05-26 LAB — PATHOLOGIST SMEAR REVIEW

## 2024-05-26 NOTE — Telephone Encounter (Signed)
 See below

## 2024-05-27 DIAGNOSIS — F419 Anxiety disorder, unspecified: Secondary | ICD-10-CM | POA: Diagnosis not present

## 2024-05-27 DIAGNOSIS — E119 Type 2 diabetes mellitus without complications: Secondary | ICD-10-CM | POA: Diagnosis not present

## 2024-05-27 DIAGNOSIS — C9110 Chronic lymphocytic leukemia of B-cell type not having achieved remission: Secondary | ICD-10-CM | POA: Diagnosis not present

## 2024-05-27 DIAGNOSIS — I10 Essential (primary) hypertension: Secondary | ICD-10-CM | POA: Diagnosis not present

## 2024-05-27 DIAGNOSIS — I4892 Unspecified atrial flutter: Secondary | ICD-10-CM | POA: Diagnosis not present

## 2024-05-27 DIAGNOSIS — Z9181 History of falling: Secondary | ICD-10-CM | POA: Diagnosis not present

## 2024-05-27 DIAGNOSIS — G20A1 Parkinson's disease without dyskinesia, without mention of fluctuations: Secondary | ICD-10-CM | POA: Diagnosis not present

## 2024-05-27 DIAGNOSIS — Z7984 Long term (current) use of oral hypoglycemic drugs: Secondary | ICD-10-CM | POA: Diagnosis not present

## 2024-05-27 DIAGNOSIS — I4891 Unspecified atrial fibrillation: Secondary | ICD-10-CM | POA: Diagnosis not present

## 2024-05-27 DIAGNOSIS — Z794 Long term (current) use of insulin: Secondary | ICD-10-CM | POA: Diagnosis not present

## 2024-05-28 ENCOUNTER — Telehealth: Payer: Self-pay | Admitting: Neurology

## 2024-05-28 NOTE — Telephone Encounter (Signed)
 Need orders Home health pt 1x 2 week, 2x 3 weeks, 1x 3 week Gate and strength

## 2024-05-29 ENCOUNTER — Ambulatory Visit
Admission: RE | Admit: 2024-05-29 | Discharge: 2024-05-29 | Disposition: A | Discharge status: Home / Self Care | Source: Ambulatory Visit | Attending: Neurology | Admitting: Neurology

## 2024-05-29 DIAGNOSIS — R251 Tremor, unspecified: Secondary | ICD-10-CM | POA: Diagnosis not present

## 2024-05-29 DIAGNOSIS — R4789 Other speech disturbances: Secondary | ICD-10-CM | POA: Diagnosis not present

## 2024-05-29 DIAGNOSIS — G20A1 Parkinson's disease without dyskinesia, without mention of fluctuations: Secondary | ICD-10-CM

## 2024-05-29 DIAGNOSIS — R29898 Other symptoms and signs involving the musculoskeletal system: Secondary | ICD-10-CM

## 2024-05-29 DIAGNOSIS — R2689 Other abnormalities of gait and mobility: Secondary | ICD-10-CM | POA: Diagnosis not present

## 2024-05-30 ENCOUNTER — Encounter: Payer: Self-pay | Admitting: Neurology

## 2024-05-31 ENCOUNTER — Telehealth: Payer: Self-pay

## 2024-05-31 NOTE — Telephone Encounter (Signed)
 Received call report from Radiology stating  Signal abnormality within the intracranial and visible distal cervical left vertebral artery suggesting vessel occlusion. Consider MR or CT angiography for further evaluation. 5. Mild generalized cerebral atrophy. 6. Increased number of lymph nodes within the visible upper neck. Prominent lymph nodes also present within the parotid glands. Per the electronic medical record, the patient has a history of chronic lymphocytic leukemia.

## 2024-05-31 NOTE — Telephone Encounter (Signed)
Called and gave orders  

## 2024-06-01 NOTE — Progress Notes (Signed)
 Pts desired phone number texted x 2 for the appointment.  Also attempted to call but phone only rang and no one answered.  VV was d/c 15 min following appt time.

## 2024-06-02 ENCOUNTER — Encounter (INDEPENDENT_AMBULATORY_CARE_PROVIDER_SITE_OTHER): Admitting: Neurology

## 2024-06-02 DIAGNOSIS — I4891 Unspecified atrial fibrillation: Secondary | ICD-10-CM | POA: Diagnosis not present

## 2024-06-02 DIAGNOSIS — C9110 Chronic lymphocytic leukemia of B-cell type not having achieved remission: Secondary | ICD-10-CM | POA: Diagnosis not present

## 2024-06-02 DIAGNOSIS — I4892 Unspecified atrial flutter: Secondary | ICD-10-CM | POA: Diagnosis not present

## 2024-06-02 DIAGNOSIS — G20A1 Parkinson's disease without dyskinesia, without mention of fluctuations: Secondary | ICD-10-CM | POA: Diagnosis not present

## 2024-06-02 DIAGNOSIS — I10 Essential (primary) hypertension: Secondary | ICD-10-CM | POA: Diagnosis not present

## 2024-06-02 DIAGNOSIS — E119 Type 2 diabetes mellitus without complications: Secondary | ICD-10-CM | POA: Diagnosis not present

## 2024-06-03 ENCOUNTER — Ambulatory Visit: Admitting: Family Medicine

## 2024-06-03 VITALS — BP 136/72 | HR 81 | Temp 98.0°F | Ht 72.0 in | Wt 250.6 lb

## 2024-06-03 DIAGNOSIS — G20A1 Parkinson's disease without dyskinesia, without mention of fluctuations: Secondary | ICD-10-CM | POA: Diagnosis not present

## 2024-06-03 DIAGNOSIS — H9201 Otalgia, right ear: Secondary | ICD-10-CM | POA: Diagnosis not present

## 2024-06-03 DIAGNOSIS — W57XXXD Bitten or stung by nonvenomous insect and other nonvenomous arthropods, subsequent encounter: Secondary | ICD-10-CM

## 2024-06-03 MED ORDER — AMOXICILLIN-POT CLAVULANATE 875-125 MG PO TABS: 1.0000 | ORAL_TABLET | Freq: Two times a day (BID) | ORAL | 0 refills | Status: DC

## 2024-06-03 NOTE — Progress Notes (Signed)
   [name] is a 83 y.o. male who presents today for an office visit.  Assessment/Plan:  Right ear pain History somewhat limited though does have erythema on exam.  He was on doxycycline  for tick bite over the last week or so however we will add on Augmentin  to cover for otitis media especially in light of his immunocompromise state secondary to his leukemia.  He has had symptoms similar to past and that both Augmentin  in the past.  He does have follow-up with ENT next week and can discuss with them if not improving.  Tick bite No red flags.  Does have mild surrounding erythema which is likely local inflammatory reaction.  No erythema migrans or other signs or symptoms of Lyme disease or [place] spotted fever.  He has been on doxycycline  for the last week or so.  Do not anticipate that this will cause him any further issues though we did discuss reasons to return to care  Parkinson disease Recently saw neurology for this.  We reviewed his MRI today.  Discussed with patient that there were no acute findings though he should discuss further testing with neurology as previously planned.     Subjective:  HPI:  See A/P for status of chronic conditions.  Patient here today with a few issues he like to discuss today.  Been having right-sided ear pain for the last several days.  He had symptoms similar several years ago that was treated with Augmentin .  He was in the ED last week with a tick bite.  This was removed prior to arrival.  He was started on empiric doxycycline .  He has not had any hearing loss.  No discharge.  Has had more sinus congestion recently as well.  He also did recently have a visit with his neurologist and would like for us  to review his recent MRI as well.        Objective:  Physical Exam: BP 136/72   Pulse 81   Temp 98 F (36.7 C) (Temporal)   Ht 6' (1.829 m)   Wt 250 lb 9.6 oz (113.7 kg)   SpO2 98%   BMI 33.99 kg/m   Gen: No acute distress, resting  comfortably HEENT: Right EAC with erythema.  Mildly erythematous TM.  No exudates.  No drainage. Skin: Excoriated lesion on right flank with surrounding erythema. Neuro: Grossly normal, moves all extremities.  Tremor noted in the left upper extremity Psych: Blunted affect affect and thought content  Time Spent: 30 minutes of total time was spent on the date of the encounter performing the following actions: chart review prior to seeing the patient including recent ED visit and visits with PCP, obtaining history, performing a medically necessary exam, counseling on the treatment plan, placing orders, and documenting in our EHR.        Worth HERO. [name], MD 06/03/2024 2:35 PM

## 2024-06-03 NOTE — Patient Instructions (Signed)
 It was very nice to see you today!  Please start the Augmentin .  Follow-up with me or Dr. Katrinka if not improving.  Let us  know if you have any worsening rash at the site of your tick bite or if you develop any fevers or chills.  Return if symptoms worsen or fail to improve.   Take care, Dr [name]  PLEASE NOTE:  If you had any lab tests, please let us  know if you have not heard back within a few days. You may see your results on mychart before we have a chance to review them but we will give you a call once they are reviewed by us .   If we ordered any referrals today, please let us  know if you have not heard from their office within the next week.   If you had any urgent prescriptions sent in today, please check with the pharmacy within an hour of our visit to make sure the prescription was transmitted appropriately.   Please try these tips to maintain a healthy lifestyle:  Eat at least 3 REAL meals and 1-2 snacks per day.  Aim for no more than 5 hours between eating.  If you eat breakfast, please do so within one hour of getting up.   Each meal should contain half fruits/vegetables, one quarter protein, and one quarter carbs (no bigger than a computer mouse)  Cut down on sweet beverages. This includes juice, soda, and sweet tea.   Drink at least 1 glass of water  with each meal and aim for at least 8 glasses per day  Exercise at least 150 minutes every week.

## 2024-06-06 ENCOUNTER — Encounter: Payer: Self-pay | Admitting: Neurology

## 2024-06-07 DIAGNOSIS — G20A1 Parkinson's disease without dyskinesia, without mention of fluctuations: Secondary | ICD-10-CM | POA: Diagnosis not present

## 2024-06-08 DIAGNOSIS — I4891 Unspecified atrial fibrillation: Secondary | ICD-10-CM | POA: Diagnosis not present

## 2024-06-08 DIAGNOSIS — I4892 Unspecified atrial flutter: Secondary | ICD-10-CM | POA: Diagnosis not present

## 2024-06-08 DIAGNOSIS — E119 Type 2 diabetes mellitus without complications: Secondary | ICD-10-CM | POA: Diagnosis not present

## 2024-06-08 DIAGNOSIS — C9110 Chronic lymphocytic leukemia of B-cell type not having achieved remission: Secondary | ICD-10-CM | POA: Diagnosis not present

## 2024-06-08 DIAGNOSIS — G20A1 Parkinson's disease without dyskinesia, without mention of fluctuations: Secondary | ICD-10-CM | POA: Diagnosis not present

## 2024-06-08 DIAGNOSIS — I10 Essential (primary) hypertension: Secondary | ICD-10-CM | POA: Diagnosis not present

## 2024-06-10 DIAGNOSIS — I4892 Unspecified atrial flutter: Secondary | ICD-10-CM | POA: Diagnosis not present

## 2024-06-10 DIAGNOSIS — E119 Type 2 diabetes mellitus without complications: Secondary | ICD-10-CM | POA: Diagnosis not present

## 2024-06-10 DIAGNOSIS — I10 Essential (primary) hypertension: Secondary | ICD-10-CM | POA: Diagnosis not present

## 2024-06-10 DIAGNOSIS — G20A1 Parkinson's disease without dyskinesia, without mention of fluctuations: Secondary | ICD-10-CM | POA: Diagnosis not present

## 2024-06-10 DIAGNOSIS — I4891 Unspecified atrial fibrillation: Secondary | ICD-10-CM | POA: Diagnosis not present

## 2024-06-10 DIAGNOSIS — C9110 Chronic lymphocytic leukemia of B-cell type not having achieved remission: Secondary | ICD-10-CM | POA: Diagnosis not present

## 2024-06-10 LAB — BASIC METABOLIC PANEL WITH GFR
BUN: 12 (ref 4–21)
CO2: 26 — AB (ref 13–22)
Chloride: 104 (ref 99–108)
Creatinine: 1.1 (ref 0.6–1.3)
Glucose: 266
Potassium: 3.8 meq/L (ref 3.5–5.1)
Sodium: 138 (ref 137–147)

## 2024-06-10 LAB — HEMOGLOBIN A1C: Hemoglobin A1C: 7.7

## 2024-06-10 LAB — PROTEIN / CREATININE RATIO, URINE: Creatinine, Urine: 128.4

## 2024-06-10 LAB — MICROALBUMIN / CREATININE URINE RATIO: Microalb Creat Ratio: 4.7

## 2024-06-10 LAB — COMPREHENSIVE METABOLIC PANEL WITH GFR
Albumin: 4.7 (ref 3.5–5.0)
Calcium: 9.3 (ref 8.7–10.7)
eGFR: 66

## 2024-06-10 LAB — HEPATIC FUNCTION PANEL
ALT: 18 U/L (ref 10–40)
AST: 19 (ref 14–40)
Alkaline Phosphatase: 58 (ref 25–125)
Bilirubin, Direct: 0.3 (ref 0.01–0.4)
Bilirubin, Total: 1.2

## 2024-06-10 LAB — MICROALBUMIN, URINE: Microalb, Ur: 0.6

## 2024-06-13 ENCOUNTER — Encounter: Payer: Self-pay | Admitting: Family Medicine

## 2024-06-14 DIAGNOSIS — E119 Type 2 diabetes mellitus without complications: Secondary | ICD-10-CM | POA: Diagnosis not present

## 2024-06-14 DIAGNOSIS — C9110 Chronic lymphocytic leukemia of B-cell type not having achieved remission: Secondary | ICD-10-CM | POA: Diagnosis not present

## 2024-06-14 DIAGNOSIS — I4892 Unspecified atrial flutter: Secondary | ICD-10-CM | POA: Diagnosis not present

## 2024-06-14 DIAGNOSIS — G20A1 Parkinson's disease without dyskinesia, without mention of fluctuations: Secondary | ICD-10-CM | POA: Diagnosis not present

## 2024-06-14 DIAGNOSIS — I4891 Unspecified atrial fibrillation: Secondary | ICD-10-CM | POA: Diagnosis not present

## 2024-06-14 DIAGNOSIS — I10 Essential (primary) hypertension: Secondary | ICD-10-CM | POA: Diagnosis not present

## 2024-06-16 DIAGNOSIS — C9110 Chronic lymphocytic leukemia of B-cell type not having achieved remission: Secondary | ICD-10-CM | POA: Diagnosis not present

## 2024-06-16 DIAGNOSIS — I4892 Unspecified atrial flutter: Secondary | ICD-10-CM | POA: Diagnosis not present

## 2024-06-16 DIAGNOSIS — G20A1 Parkinson's disease without dyskinesia, without mention of fluctuations: Secondary | ICD-10-CM | POA: Diagnosis not present

## 2024-06-16 DIAGNOSIS — E119 Type 2 diabetes mellitus without complications: Secondary | ICD-10-CM | POA: Diagnosis not present

## 2024-06-16 DIAGNOSIS — I4891 Unspecified atrial fibrillation: Secondary | ICD-10-CM | POA: Diagnosis not present

## 2024-06-16 DIAGNOSIS — I10 Essential (primary) hypertension: Secondary | ICD-10-CM | POA: Diagnosis not present

## 2024-06-16 NOTE — Assessment & Plan Note (Signed)
-  Diagnosed in 2015 after presenting with lymphocytosis and adenopathy -Stage 0, on active surveillance, has not required any treatment. 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 Results for CCND1/IGH, ATM, chromosome 12, and TP53 were normal.  -CT 04/09/2024 showed diffuse adenopathy in neck, abdomen and pelvis, up to 2 cm, new splenectomy and persistent hepatomegaly.

## 2024-06-16 NOTE — Progress Notes (Signed)
 Assessment/Plan:   1.  Parkinsons Disease with levodopa  resistant tremor  -Patient is status post focused ultrasound to the left VIM.  He had that done at [facility] on December 18, 2022.  - Patient's balance did get worse somewhat post focused ultrasound, and that is fairly common postprocedure and that was discussed with him preprocedure.  This was actually the reason that [facility] did not want to go in and do the second side.  However, deconditioning and lack of exercise also plays a role here.  He is now in home PT  -pt with R facial droop since his focused ultrasound and I told him that the R eye isn't blinking well.  He needs to use extra lubrication.  He is noting that he drools when drinking liquid and discussed that this is a bit different from Parkinsons Disease drooling but instead from weakness of the muscles around the mouth  - Just started with home physical therapy.  - Patient does have a bit of a resistant tremor, but has gone back on carbidopa /levodopa  25/100, 1 tablet 3 times per day for the treatment of Parkinson's disease.  -[facility] get [facility]  but having some trouble.  Information given to [place]. 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  -He is following with [facility] dermatology.   2.  Anxiety  -Following with psychiatry at [facility].  -On low-dose mirtazapine  by [facility], 7.5 mg at bed  - On BuSpar   3.  Hypertension  -On several antihypertensives including hydrochlorothiazide, losartan , amlodipine .  We will need to watch this in the future given the nature of Parkinson's disease for causing dysautonomia.  Blood pressure was good in the office and wife is going to monitor it at home.  I told her she does not need to take this daily, but perhaps a few times per week.  4.  Diabetes, type II, insulin -dependent  -Managed by Dr. Katrinka.  5.  CLL, diagnosed 2015  -Patient is following with oncology.  - he is getting ready  [facility]  6.  New onset A-fib/a flutter in September, 2024  -Currently not on anticoagulation.    - Zio patch in October, 2024 without evidence of A-fib, but he did have 153 episodes of SVT and frequent PACs.  7.  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  He has declined.  He will start ASA EC, 81 mg daily.   Subjective:   [name] was seen today in follow up to go over his MRI brain that was just recently completed.  There was no evidence of acute infarct.  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  There was a number of lymph nodes in his neck and parotid glands, consistent with his diagnosis of CLL.  There was evidence of possible left vertebral occlusion and radiology suggested follow-up with MRA or CTA.  He was referred to physical therapy since last visit and they are doing that in the home.  They also decided after our last visit to go ahead and restart levodopa .  He also has met with the PA at [facility] on July 7 where his focused ultrasound was done.  This was only a phone visit, however.  They note that he is getting ready to start tx for the CLL.  Current prescribed movement disorder medications: carbidopa /levodopa  25/100 tid Mirtazapine , 7.5 mg nightly (by [facility])   ALLERGIES:   Allergies  Allergen Reactions   Morphine Sulfate Itching and Rash    CURRENT MEDICATIONS:  Current Meds  Medication Sig   acalabrutinib  maleate (CALQUENCE ) 100 MG tablet Take 1 tablet (100 mg total) by mouth 2 (two) times daily.   amLODipine  (NORVASC ) 10 MG tablet Take 10 mg by mouth daily.   busPIRone  (BUSPAR ) 10 MG tablet Take by mouth 3 (three) times daily.   carbidopa -levodopa  (SINEMET  IR) 25-100 MG tablet Take 1 tablet by mouth 3 (three) times daily.   Cholecalciferol (VITAMIN D3) 50 MCG (2000 UT) TABS Take 1 tablet by mouth daily.   empagliflozin   (JARDIANCE ) 25 MG TABS tablet Take 1 tablet (25 mg total) by mouth daily before breakfast. (G[facility]EN [facility])   escitalopram (LEXAPRO) 10 MG tablet Take 10 mg by mouth daily.   gabapentin  (NEURONTIN ) 300 MG capsule Take 600 mg by mouth 3 (three) times daily.   glucose blood test strip Use to test 4 times daily. E11.9   hydrochlorothiazide (HYDRODIURIL) 25 MG tablet Take 25 mg by mouth daily.   insulin  aspart (NOVOLOG ) 100 UNIT/ML injection Inject 35 Units into the skin 3 (three) times daily with meals. When sugar is elevated   insulin  glargine (LANTUS ) 100 UNIT/ML injection Inject 50 Units into the skin at bedtime.   levothyroxine  (SYNTHROID , LEVOTHROID) 50 MCG tablet Take 50 mcg by mouth daily before breakfast.   losartan  (COZAAR ) 100 MG tablet Take 100 mg by mouth daily.   mirtazapine  (REMERON ) 15 MG tablet Take 15 mg by mouth at bedtime.   neomycin -polymyxin b-dexamethasone  (MAXITROL ) 3.5-10000-0.1 SUSP Place 1 drop into both eyes 3 (three) times daily.   OVER THE COUNTER MEDICATION PreserVision for eyes   potassium chloride  SA (K-DUR,KLOR-CON ) 20 MEQ tablet Take 20 mEq by mouth daily.   propranolol  (INDERAL ) 40 MG tablet Take 40 mg by mouth 3 (three) times daily.   simvastatin  (ZOCOR ) 80 MG tablet Take 40 mg by mouth daily at 6 PM. Take half tablet (40 mg) by mouth once daily   sucralfate  (CARAFATE ) 1 G tablet Take 1 g by mouth 4 (four) times daily -  with meals and at bedtime.   terbinafine  (LAMISIL ) 1 % cream Apply 1 Application topically 2 (two) times daily.   traMADol  (ULTRAM ) 50 MG tablet Take 1 tablet (50 mg total) by mouth every 6 (six) hours as needed for moderate pain (pain score 4-6).   zolpidem  (AMBIEN ) 5 MG tablet Take 1 tablet (5 mg total) by mouth at bedtime as needed for sleep.     Objective:   PHYSICAL EXAMINATION:    VITALS:   Vitals:   06/21/24 0824  BP: 122/76  Pulse: 88  SpO2: 96%  Weight: 205 lb 6.4 oz (93.2 kg)  Height: 6' (1.829 m)    GEN:  The patient  appears stated age and is in NAD. HEENT:  Normocephalic, atraumatic.  The mucous membranes are moist. The superficial temporal arteries are without ropiness or tenderness. CV:  RRR Lungs:  CTAB  Neurological examination:  Orientation: The patient is alert and oriented x3. Cranial nerves: There is mild R facial droop.  He has mild L ptosis.  The speech is fluent and mildly dysarthric. Soft palate rises symmetrically and there is no tongue deviation. Hearing is significantly decreased to conversational tone. Sensation: Sensation is intact to light touch throughout Motor: Strength is 5/5 in the UE/LE  Movement  examination: Tone: There is normal tone in the upper and lower extremities Abnormal movements: there is mild left upper extremity rest tremor. Coordination:  There is mild decremation with finger taps bilaterally Gait and Station: The patient pushes off to arise.  He is flexed at the waist and is very slow and short stepped today.  He is dragging the R leg.  I have reviewed and interpreted the following labs independently    Chemistry      Component Value Date/Time   NA 140 06/17/2024 0724   NA 143 10/14/2023 1454   NA 142 10/30/2017 0845   K 3.9 06/17/2024 0724   K 3.7 10/30/2017 0845   CL 105 06/17/2024 0724   CO2 26 06/17/2024 0724   CO2 28 10/30/2017 0845   BUN 14 06/17/2024 0724   BUN 15 10/14/2023 1454   BUN 13.3 10/30/2017 0845   CREATININE 1.16 06/17/2024 0724   CREATININE 1.2 10/30/2017 0845   GLU 70 03/26/2023 0000      Component Value Date/Time   CALCIUM  8.6 (L) 06/17/2024 0724   CALCIUM  9.5 10/30/2017 0845   ALKPHOS 50 06/17/2024 0724   ALKPHOS 53 10/30/2017 0845   AST 18 06/17/2024 0724   AST 37 (H) 10/30/2017 0845   ALT 16 06/17/2024 0724   ALT 37 10/30/2017 0845   BILITOT 1.4 (H) 06/17/2024 0724   BILITOT 1.23 (H) 10/30/2017 0845       Lab Results  Component Value Date   WBC 94.5 (HH) 06/17/2024   HGB 13.3 06/17/2024   HCT 40.3 06/17/2024    MCV 85.4 06/17/2024   PLT 165 06/17/2024    Lab Results  Component Value Date   TSH 1.40 01/28/2024     Total time spent on today's visit was 32 minutes, including both face-to-face time and nonface-to-face time.  Time included that spent on review of records (prior notes available to me/labs/imaging if pertinent), discussing treatment and goals, answering patient's questions and coordinating care.   Cc:  Katrinka Garnette KIDD, MD

## 2024-06-17 ENCOUNTER — Telehealth: Payer: Self-pay | Admitting: Pharmacist

## 2024-06-17 ENCOUNTER — Inpatient Hospital Stay: Attending: Hematology

## 2024-06-17 ENCOUNTER — Other Ambulatory Visit (HOSPITAL_COMMUNITY): Payer: Self-pay

## 2024-06-17 ENCOUNTER — Inpatient Hospital Stay (HOSPITAL_BASED_OUTPATIENT_CLINIC_OR_DEPARTMENT_OTHER): Admitting: Hematology

## 2024-06-17 ENCOUNTER — Ambulatory Visit

## 2024-06-17 ENCOUNTER — Telehealth: Payer: Self-pay

## 2024-06-17 VITALS — BP 110/60 | HR 56 | Temp 98.1°F | Resp 17 | Ht 72.0 in | Wt 251.1 lb

## 2024-06-17 DIAGNOSIS — C9110 Chronic lymphocytic leukemia of B-cell type not having achieved remission: Secondary | ICD-10-CM | POA: Insufficient documentation

## 2024-06-17 DIAGNOSIS — I998 Other disorder of circulatory system: Secondary | ICD-10-CM | POA: Insufficient documentation

## 2024-06-17 DIAGNOSIS — H9190 Unspecified hearing loss, unspecified ear: Secondary | ICD-10-CM | POA: Insufficient documentation

## 2024-06-17 DIAGNOSIS — G20A1 Parkinson's disease without dyskinesia, without mention of fluctuations: Secondary | ICD-10-CM | POA: Insufficient documentation

## 2024-06-17 DIAGNOSIS — E119 Type 2 diabetes mellitus without complications: Secondary | ICD-10-CM | POA: Diagnosis not present

## 2024-06-17 DIAGNOSIS — E538 Deficiency of other specified B group vitamins: Secondary | ICD-10-CM | POA: Diagnosis not present

## 2024-06-17 DIAGNOSIS — C4A61 Merkel cell carcinoma of right upper limb, including shoulder: Secondary | ICD-10-CM

## 2024-06-17 DIAGNOSIS — Z8673 Personal history of transient ischemic attack (TIA), and cerebral infarction without residual deficits: Secondary | ICD-10-CM | POA: Diagnosis not present

## 2024-06-17 LAB — CBC WITH DIFFERENTIAL (CANCER CENTER ONLY)
Abs Immature Granulocytes: 0 K/uL (ref 0.00–0.07)
Basophils Absolute: 0 K/uL (ref 0.0–0.1)
Basophils Relative: 0 %
Eosinophils Absolute: 0 K/uL (ref 0.0–0.5)
Eosinophils Relative: 0 %
HCT: 40.3 % (ref 39.0–52.0)
Hemoglobin: 13.3 g/dL (ref 13.0–17.0)
Lymphocytes Relative: 92 %
Lymphs Abs: 86.9 K/uL — ABNORMAL HIGH (ref 0.7–4.0)
MCH: 28.2 pg (ref 26.0–34.0)
MCHC: 33 g/dL (ref 30.0–36.0)
MCV: 85.4 fL (ref 80.0–100.0)
Monocytes Absolute: 0.9 K/uL (ref 0.1–1.0)
Monocytes Relative: 1 %
Neutro Abs: 6.6 K/uL (ref 1.7–7.7)
Neutrophils Relative %: 7 %
Platelet Count: 165 K/uL (ref 150–400)
RBC: 4.72 MIL/uL (ref 4.22–5.81)
RDW: 14.9 % (ref 11.5–15.5)
Smear Review: NORMAL
WBC Count: 94.5 K/uL (ref 4.0–10.5)
nRBC: 0 % (ref 0.0–0.2)

## 2024-06-17 LAB — CMP (CANCER CENTER ONLY)
ALT: 16 U/L (ref 0–44)
AST: 18 U/L (ref 15–41)
Albumin: 4.1 g/dL (ref 3.5–5.0)
Alkaline Phosphatase: 50 U/L (ref 38–126)
Anion gap: 9 (ref 5–15)
BUN: 14 mg/dL (ref 8–23)
CO2: 26 mmol/L (ref 22–32)
Calcium: 8.6 mg/dL — ABNORMAL LOW (ref 8.9–10.3)
Chloride: 105 mmol/L (ref 98–111)
Creatinine: 1.16 mg/dL (ref 0.61–1.24)
GFR, Estimated: >60 mL/min (ref >60)
Glucose, Bld: 257 mg/dL — ABNORMAL HIGH (ref 70–99)
Potassium: 3.9 mmol/L (ref 3.5–5.1)
Sodium: 140 mmol/L (ref 135–145)
Total Bilirubin: 1.4 mg/dL — ABNORMAL HIGH (ref 0.0–1.2)
Total Protein: 6.1 g/dL — ABNORMAL LOW (ref 6.5–8.1)

## 2024-06-17 MED ORDER — ZANUBRUTINIB 80 MG PO CAPS: 160.0000 mg | ORAL_CAPSULE | Freq: Two times a day (BID) | ORAL | 1 refills | Status: DC

## 2024-06-17 MED ORDER — CYANOCOBALAMIN 1000 MCG/ML IJ SOLN
1000.0000 ug | Freq: Once | INTRAMUSCULAR | Status: AC
  Administered 2024-06-17: 1000 ug via INTRAMUSCULAR

## 2024-06-17 NOTE — Progress Notes (Signed)
 [facility]   Telephone:(336) [phone] Fax:([phone]) [phone]   Clinic Follow up Note   Patient Care Team: Katrinka Garnette KIDD, MD as PCP - General (Family Medicine) Alvia Norleen BIRCH, MD as Consulting Physician (Ophthalmology) Livingston Rigg, MD (Inactive) as Consulting Physician (Dermatology) Nicholaus Sherlean CROME, [facility] (Inactive) (Pharmacist) Lanny Callander, MD as Consulting Physician (Hematology and Oncology) Kate Lonni CROME, MD as Consulting Physician (Cardiology)  Date of Service:  06/17/2024  CHIEF COMPLAINT: f/u of CLL/SLL  CURRENT THERAPY:  Pending Brukinsa   Oncology History   Chronic lymphocytic leukemia (HCC) -Diagnosed in 2015 after presenting with lymphocytosis and adenopathy -Stage 0, on active surveillance, has not required any treatment. 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 Results for CCND1/IGH, ATM, chromosome 12, and TP53 were normal.  -CT 04/09/2024 showed diffuse adenopathy in neck, abdomen and pelvis, up to 2 cm, new splenectomy and persistent hepatomegaly.  Assessment & Plan Chronic lymphocytic leukemia (CLL) Progression of CLL with lymphadenopathy, splenomegaly, and hepatomegaly. Elevated white blood cell and lymphocyte counts. ALC doubling time close to 6 months. He has disease progression without organ damage. - Start Brukinsa  160 mg twice daily - Educate him and his caregiver about Brukinsa , including potential side effects such as stomach queasiness and mild diarrhea, risk of bleeding, AF etc  -Discussed that duration of treatment is indefinite until disease progression or intolerance - [facility] - Arrange for oral pharmacist [place] to provide education on medication - Monitor blood counts monthly initially, then every three months once  stable  Vascular ischemia Vascular ischemia changes on imaging. - Follow up with neurology by the end of the month  History of Stroke Recent brain MRI showed old stroke with vascular changes on imaging.  Parkinson's disease Parkinson's disease managed by neurology.  Diabetes mellitus Diabetes managed with monitoring.  Hearing loss Hearing loss managed with hearing aids.   Plan - Lab reviewed, continue worsening lymphocytosis, ALC 86.9 today.  His ALC doubling time is close to 6 months.  Recent CT scan also reviewed worsening adenopathy and splenomegaly. - I recommend him to start a Brukinsa  160 mg twice daily, side effect of reviewed with him and his wife.  Prescription called into [place] pharmacy, he will start when he received - Follow-up in 3 to 4 weeks    Discussed the use of AI scribe software for clinical note transcription with the patient, who gave verbal consent to proceed.  History of Present Illness [name] is an 83 year old male with chronic lymphocytic leukemia who presents for follow-up. He is accompanied by his wife, who is his primary caregiver.  Recent CT scan reveals enlarged lymph nodes in the neck and abdomen, with a slightly enlarged spleen measuring 1-2 cm. He experienced a tick bite, initially mistaken for a growth, and was treated with antibiotics. Swelling behind the ear led to a CT scan in May, confirming enlarged lymph nodes.  He has Parkinson's disease with vascular ischemia changes and an old stroke noted on brain MRI. He is scheduled for a neurology consultation. He manages diabetes with a monitor and receives monthly B12 shots. He reports no current stomach discomfort, has dry skin, and bruises easily.     All other systems were reviewed with the patient and are negative.  MEDICAL HISTORY:  Past Medical History:  Diagnosis Date   Arthritis    Basal cell carcinoma 03/18/2017   left neck CX3 5FU   Bell's palsy 05/08/2020   Cataract     Chronic bilateral low back pain without sciatica 12/12/2015   Chronic lymphocytic leukemia 05/16/2014   Oncology q6 months. Thought to be agent orange related-CLL and prostate cancer  Diagnosis 1. 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 - NO EVIDENCE OF EXTRAPROSTATIC EXTENSION, ANGIOLYMPHATIC INVASION OR SEMINAL VESICLE INVOLVEMENT IDENTIFIED. - RESECTION MARGINS, NEGATIVE FOR ATYPIA OR MALIGNANCY. 2. Lymph nodes, regional resection, righ   Chronic pain syndrome 02/01/2016   Chronic right hip pain 09/27/2019   DDD (degenerative disc disease), lumbar    Diabetic retinopathy 09/24/2007   Diverticulosis of colon    Elevated PSA    Erectile dysfunction 12/30/2007   No rx.      GERD (gastroesophageal reflux disease)    Gynecomastia 12/28/2020   History of colonic polyps 08/22/2008   2004 3 adenomas 2009 none 2013 none 02/26/2017 8 mm sigmoid polyp was inflammatory - no recall planned given age/co-morbidities overall hx of polyps     History of prostate cancer 01/24/2014   Follows with Dr. Brunetta every 3 months. Dr. Amadeo every 6 months.  Diagnosis 1. 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 - NO EVIDENCE OF EXTRAPROSTATIC EXTENSION, ANGIOLYMPHATIC INVASION OR SEMINAL VESICLE INVOLVEMENT IDENTIFIED. - RESECTION MARGINS, NEGATIVE FOR ATYPIA OR MALIGNANCY. 2. Lymph nodes, regional resection, right pelvic - SIX   Hyperlipidemia associated with type 2 diabetes mellitus 09/24/2007   High triglycerides. Refused statin due to myalgias in past but then started half tablet of simvastatin  40mg  through [facility] of this note might be different from the original. Formatting of this note might be different from the original. High triglycerides. Refused statin due to myalgias in past but then started half tablet of simvastatin  40mg  through [place]  Last Assessment & Plan:  Formatting     Hypertension associated with diabetes 09/24/2007   Amlodipine  10mg , losartan  100mg , HCTZ 25mg , propranolol  40 mg twice a day Home cuff 142/85 compared to 134/72 on my check. Likely home cuff about 10 points higher.   --> chlorthalidone  03/20/16 but patient didn't change and then BP controlled on next visit   Formatting of this note might be different from the original. Formatting of this note might be different from the original. 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  Follow up in 6 months unless wound issues arise.  Derm follow up in October 2022 scheduled.  I will see him back in 6 months.   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 a1c under 8     Formatting of this note might be different from the original. Formatting of this note might be different from the original. And macular degeneration.   Last Assessment & Plan:  Formatting of this note might be different from the original. S: diabetic retinopathy followed up by optho yesterday and largely stable. He also has m   Vitamin D  deficiency 11/01/2020   At [facility]- on 2000 units a day at least since 2021     SURGICAL HISTORY: Past Surgical  History:  Procedure Laterality Date   CHOLECYSTECTOMY     COLONOSCOPY  03/25/12, 09/28/08   ESOPHAGOGASTRODUODENOSCOPY N/A 10/12/2014   Procedure: ESOPHAGOGASTRODUODENOSCOPY (EGD);  Surgeon: Lupita FORBES Commander, MD;  Location: [facility];  Service: Endoscopy;  Laterality: N/A;   ESOPHAGOGASTRODUODENOSCOPY ENDOSCOPY  09/28/08   EXCISION MELANOMA WITH SENTINEL LYMPH NODE BIOPSY Right 05/31/2021   Procedure: WIDE LOCAL EXCISION RIGHT FOREARM WITH ADVANCEDMENT FLAP CLOSURE WITH SENTINEL LYMPH NODE MAPPING AND BIOSPY;  Surgeon: Aron Shoulders, MD;  Location: [facility];  Service: General;  Laterality: Right;   FOOT SURGERY Bilateral    HEMORRHOID SURGERY     LAPAROSCOPIC GASTRIC BANDING  03/05/11   weight loss   LAPAROSCOPIC GASTRIC BANDING WITH HIATAL HERNIA REPAIR  03/05/2011   LYMPHADENECTOMY Bilateral 01/24/2014   Procedure: LYMPHADENECTOMY;  Surgeon: Noretta Ferrara, MD;  Location: [facility];  Service: Urology;  Laterality: Bilateral;   ORIF TIBIA FRACTURE Right    PENILE PROSTHESIS IMPLANT     PROSTATE SURGERY  01/2014   ROBOT ASSISTED LAPAROSCOPIC RADICAL PROSTATECTOMY N/A 01/24/2014   Procedure: ROBOTIC ASSISTED LAPAROSCOPIC RADICAL PROSTATECTOMY LEVEL 3;  Surgeon: Noretta Ferrara, MD;  Location: [facility];  Service: Urology;  Laterality: N/A;   SHOULDER SURGERY Right 2011   TONSILLECTOMY     age 62   VASECTOMY      I have reviewed the social history and family history with the patient and they are unchanged from previous note.  ALLERGIES:  is allergic to morphine sulfate.  MEDICATIONS:  Current Outpatient Medications  Medication Sig Dispense Refill   zanubrutinib  (BRUKINSA ) 80 MG capsule Take 2 capsules (160 mg total) by mouth 2 (two) times daily. 120 capsule 1   amLODipine  (NORVASC ) 10 MG tablet Take 10 mg by mouth daily.     busPIRone  (BUSPAR ) 10 MG tablet Take by mouth 3 (three) times daily.     carbidopa -levodopa  (SINEMET  IR) 25-100 MG tablet Take 1 tablet by mouth 3 (three) times  daily. 270 tablet 1   Cholecalciferol (VITAMIN D3) 50 MCG (2000 UT) TABS Take 1 tablet by mouth daily.     empagliflozin  (JARDIANCE ) 25 MG TABS tablet Take 1 tablet (25 mg total) by mouth daily before breakfast. ([facility]) 30 tablet    escitalopram (LEXAPRO) 10 MG tablet Take 10 mg by mouth daily.     gabapentin  (NEURONTIN ) 300 MG capsule Take 600 mg by mouth 3 (three) times daily.     glucose blood test strip Use to test 4 times daily. E11.9 100 each 12   hydrochlorothiazide (HYDRODIURIL) 25 MG tablet Take 25 mg by mouth daily.     insulin  aspart (NOVOLOG ) 100 UNIT/ML injection Inject 35 Units into the skin 3 (three) times daily with meals. When sugar is elevated     insulin  glargine (LANTUS ) 100 UNIT/ML injection Inject 50 Units into the skin at bedtime.     levothyroxine  (SYNTHROID , LEVOTHROID) 50 MCG tablet  Take 50 mcg by mouth daily before breakfast.     losartan  (COZAAR ) 100 MG tablet Take 100 mg by mouth daily.     mirtazapine  (REMERON ) 15 MG tablet Take 15 mg by mouth at bedtime.     neomycin -polymyxin b-dexamethasone  (MAXITROL ) 3.5-10000-0.1 SUSP Place 1 drop into both eyes 3 (three) times daily.     OVER THE COUNTER MEDICATION PreserVision for eyes     potassium chloride  SA (K-DUR,KLOR-CON ) 20 MEQ tablet Take 20 mEq by mouth daily.     propranolol  (INDERAL ) 40 MG tablet Take 40 mg by mouth 3 (three) times daily.     simvastatin  (ZOCOR ) 80 MG tablet Take 40 mg by mouth daily at 6 PM. Take half tablet (40 mg) by mouth once daily     sucralfate  (CARAFATE ) 1 G tablet Take 1 g by mouth 4 (four) times daily -  with meals and at bedtime.     terbinafine  (LAMISIL ) 1 % cream Apply 1 Application topically 2 (two) times daily.     traMADol  (ULTRAM ) 50 MG tablet Take 1 tablet (50 mg total) by mouth every 6 (six) hours as needed for moderate pain (pain score 4-6). 30 tablet 5   zolpidem  (AMBIEN ) 5 MG tablet Take 1 tablet (5 mg total) by mouth at bedtime as needed for sleep. 30 tablet 5   No  current facility-administered medications for this visit.    PHYSICAL EXAMINATION: ECOG PERFORMANCE STATUS: 2 - Symptomatic, <50% confined to bed  Vitals:   06/17/24 0819  BP: 110/60  Pulse: (!) 56  Resp: 17  Temp: 98.1 F (36.7 C)  SpO2: 95%   Wt Readings from Last 3 Encounters:  06/17/24 251 lb 1.6 oz (113.9 kg)  06/03/24 250 lb 9.6 oz (113.7 kg)  05/25/24 260 lb (117.9 kg)     GENERAL:alert, no distress and comfortable SKIN: skin color, texture, turgor are normal, no rashes or significant lesions EYES: normal, Conjunctiva are pink and non-injected, sclera clear NECK: supple, thyroid  normal size, non-tender, without nodularity LYMPH:  no palpable lymphadenopathy in the cervical, axillary  LUNGS: clear to auscultation and percussion with normal breathing effort HEART: regular rate & rhythm and no murmurs and no lower extremity edema ABDOMEN:abdomen soft, non-tender and normal bowel sounds Musculoskeletal:no cyanosis of digits and no clubbing  NEURO: alert & oriented x 3, (+) tremor in hands  Physical Exam    LABORATORY DATA:  I have reviewed the data as listed    Latest Ref Rng & Units 06/17/2024    7:24 AM 05/25/2024    3:26 PM 04/21/2024   10:56 AM  CBC  WBC 4.0 - 10.5 K/uL 94.5  84.3  71.8   Hemoglobin 13.0 - 17.0 g/dL 86.6  85.3  85.6   Hematocrit 39.0 - 52.0 % 40.3  43.9  42.5   Platelets 150 - 400 K/uL 165  168  166         Latest Ref Rng & Units 06/17/2024    7:24 AM 05/25/2024    3:26 PM 04/21/2024   10:56 AM  CMP  Glucose 70 - 99 mg/dL 742  826  848   BUN 8 - 23 mg/dL 14  18  16    Creatinine 0.61 - 1.24 mg/dL 8.83  8.94  8.95   Sodium 135 - 145 mmol/L 140  138  140   Potassium 3.5 - 5.1 mmol/L 3.9  5.7  4.3   Chloride 98 - 111 mmol/L 105  103  104  CO2 22 - 32 mmol/L 26  24  31    Calcium  8.9 - 10.3 mg/dL 8.6  9.1  9.3   Total Protein 6.5 - 8.1 g/dL 6.1   6.4   Total Bilirubin 0.0 - 1.2 mg/dL 1.4   1.4   Alkaline Phos 38 - 126 U/L 50   50   AST  15 - 41 U/L 18   19   ALT 0 - 44 U/L 16   19       RADIOGRAPHIC STUDIES: I have personally reviewed the radiological images as listed and agreed with the findings in the report. No results found.    No orders of the defined types were placed in this encounter.  All questions were answered. The patient knows to call the clinic with any problems, questions or concerns. No barriers to learning was detected. The total time spent in the appointment was 40 minutes, including review of chart and various tests results, discussions about plan of care and coordination of care plan     Onita Mattock, MD 06/17/2024

## 2024-06-17 NOTE — Progress Notes (Signed)
 Patient is in office today for a nurse visit for B12 Injection, per PCP's order. Patient Injection was given in the  Left deltoid. Patient tolerated injection well. Pt wanted to inform Dr [name] he will starting a new medication from Cancer center awaiting approval from the [place]

## 2024-06-17 NOTE — Progress Notes (Signed)
 Critical lab value: WBC 94.5  Pt has CLL  Dr. Lanny notified.

## 2024-06-17 NOTE — Telephone Encounter (Addendum)
 Oral Oncology Pharmacist Encounter  Received new prescription for Brukinsa  (zanubrutinib ) for the treatment of CLL, planned duration until disease progression or unacceptable drug toxicity.  CBC w/ Diff and CMP from 06/17/24 assessed, patient WBC 94.5 K/uL, secondary to CLL and ALC 86.9 K/uL. Noted patient with glucose of 257 mg/dL - recommend monitoring while on therapy with Brukinsa  as it can contribute to hyperglycemia. Prescription dose and frequency assessed for appropriateness.  Current medication list in Epic reviewed, DDIs with Brukinsa  identified: Category C drug-drug interaction between Brukinsa  and Lexapro due to agents with concomitant antiplatelet properties. No changes in therapy warranted. Monitor patient for increased s/sx of bruising.  Evaluated chart and no patient barriers to medication adherence noted.   Currently pending if patient will be able to fill through [facility] pharmacy.  Oral Oncology Clinic will continue to follow for insurance authorization, copayment issues, initial counseling and start date.  [name], PharmD, BCPS, BCOP Hematology/Oncology Clinical Pharmacist [phone] 06/17/2024 3:36 PM

## 2024-06-17 NOTE — Telephone Encounter (Signed)
 Oral Oncology Patient Advocate Encounter   Received notification that prior authorization for Brukinsa  is required.   PA submitted on 06/17/24 Key  [id number] Status is pending      Charlott Hamilton,  CPhT-Adv  she/her/hers [facility]  [facility] Pharmacy Technician Patient Advocate Specialist III [facility] Phone: [phone]  Fax: [phone] [email or web address]

## 2024-06-18 ENCOUNTER — Telehealth: Payer: Self-pay | Admitting: Pharmacist

## 2024-06-18 ENCOUNTER — Telehealth: Payer: Self-pay

## 2024-06-18 ENCOUNTER — Other Ambulatory Visit: Payer: Self-pay

## 2024-06-18 ENCOUNTER — Other Ambulatory Visit (HOSPITAL_COMMUNITY): Payer: Self-pay

## 2024-06-18 DIAGNOSIS — C9110 Chronic lymphocytic leukemia of B-cell type not having achieved remission: Secondary | ICD-10-CM

## 2024-06-18 MED ORDER — ZANUBRUTINIB 80 MG PO CAPS: 160.0000 mg | ORAL_CAPSULE | Freq: Two times a day (BID) | ORAL | 1 refills | Status: DC

## 2024-06-18 MED ORDER — CALQUENCE 100 MG PO TABS
100.0000 mg | ORAL_TABLET | Freq: Two times a day (BID) | ORAL | 1 refills | Status: DC
  Filled 2024-06-21: qty 60, 30d supply, fill #0
  Filled 2024-07-14: qty 60, 30d supply, fill #1

## 2024-06-18 NOTE — Telephone Encounter (Signed)
 Oral Oncology Patient Advocate Encounter  Prior Authorization for Calquence has been approved.    PA# [id number] Effective dates: 06/18/24 through 12/01/24  Patients co-pay is $1871.14.      Charlott Hamilton,  CPhT-Adv  she/her/hers [facility]  [facility] Pharm[facility] Services Pharmacy Technician Patient Advocate Specialist III [facility] Phone: [phone]  Fax: [phone] [email or web address]

## 2024-06-18 NOTE — Telephone Encounter (Signed)
 Oral Oncology Patient Advocate Encounter  Received notification that the request for prior authorization for Brukinsa  has been denied due to    BRUKINSA  CAP 80MG  is denied for not meeting the prior authorization requirement(s). Medication authorization requires the following: One of the following: (1) You have a contraindication or intolerance to Calquence (acalabrutinib). (2) The drug is prescribed for continuation of prior therapy within the past 120 days. Reviewed by: R.Ph..      [name],  CPhT-Adv  she/her/hers [facility]  [facility] Special[facility] Pharm[facility] Services Pharmacy Technician Patient Advocate Specialist III [facility] Phone: [phone]  Fax: [phone] [name]

## 2024-06-18 NOTE — Telephone Encounter (Signed)
 Clinical Pharmacist Practitioner Encounter   Received new prescription for Calquence (acalabrutinib) for the treatment of CLL, planned duration until disease progression or unacceptable drug toxicity.   MD initially wanted to use zanubrutinib  but patient's insurance preferred acalabrutinib  CMP from 06/17/24 assessed, very slight elevation in t.bili, continue to monitor. Prescription dose and frequency assessed.   Current medication list in Epic reviewed, one DDIs with acalabrutinib identified: Escitalopram: Acalabrutinib may increase antiplatelet effects of Agents with Antiplatelet Effects, such as escitalopram. Monitor platelets and s/sx of bleeding. No baseline dose adjustment needed.  Evaluated chart and no patient barriers to medication adherence identified.   Prescription has been e-scribed to the [facility] for benefits analysis and approval.  Oral Oncology Clinic will continue to follow for insurance authorization, copayment issues, initial counseling and start date.   Giliana Vantil N. Deissy Guilbert, PharmD, BCOP, CPP Hematology/Oncology Clinical Pharmacist [facility]/AP Oral Chemotherapy Navigation Clinic [phone]  06/18/2024 11:46 AM

## 2024-06-18 NOTE — Telephone Encounter (Signed)
 Oral Oncology Patient Advocate Encounter   Received notification that prior authorization for Calquence is required.   PA submitted on 06/18/24 Key [id number] Status is pending      Charlott Hamilton,  CPhT-Adv  she/her/hers [facility]  [facility] Specialty Pharmacy Services Pharmacy Technician Patient Advocate Specialist III [facility] Phone: [phone]  Fax: [phone] [email or web address]

## 2024-06-18 NOTE — Telephone Encounter (Signed)
 Patient's insurance denied zanubrutinib , their preferred product is acalabrutinib. Contact Dr. Lanny who is okay with the change to the insurance covered product acalabrutinib.

## 2024-06-18 NOTE — Progress Notes (Signed)
 Head & Neck Surgery Follow-up Clinic Visit   Subjective: [name] is a 83 y.o. male seen in follow up for his history of excess cerumen.  He was last seen by [facility] December 2024 for ear cleaning.  His wife is a bit frustrated as she has had several mix ups with his appointment.   No report of ear drainage or pain.   Medical History[1] Surgical History[2] Family History[3]   Allergies[4]   ROS: Negative for fever,double vision,chestpain,shortness of breath,vomiting,blood in urine, joint pain,skin rash,seizures and unusual bleeding  Objective: Vitals:   06/18/24 1025  BP: (!) 123/52  Pulse: 87  Resp: 20  SpO2: 96%   Physical Exam:   General Normocephalic, Awake, Alert  Eyes PERRL, no scleral icterus or conjunctival hemorrhage. EOMI.  Pulmonary No audible stridor, Breathing easily with no labor.  Neuro Symmetric facial movement.  Tongue protrudes in midline.  Psychiatry Appropriate affect and mood for clinic visit.  Skin No scars or lesions on face or neck.     Pertinent findings Under microscopic exam non occluding moist cerumen was removed from the bilateral auditory openings.   Canals and TM's were intact and healthy.       Assessment:  My impression is that Kevon has  1. Excessive cerumen in both ear canals   .      Plan:    His wife will call for follow up in [place] as it is more convenient.  Instructed to call for any questions or concerns.   Electronically signed by: Gaylyn Grayce LABOR Rouchard-Plasser, PA-C 06/18/2024 11:09 AM      [1] Past Medical History: Diagnosis Date  . Acid reflux   . Arthritis   . CPAP (continuous positive airway pressure) dependence   . Diabetes mellitus    (CMD)   . Obstructive sleep apnea   . Osteoporosis   . Prostate cancer    (CMD)   [2] Past Surgical History: Procedure Laterality Date  . CHOLECYSTECTOMY     Procedure: CHOLECYSTECTOMY  . HERNIA REPAIR     Procedure: HERNIA REPAIR  . LAPAROSCOPIC  GASTRIC BANDING     Procedure: LAPAROSCOPIC GASTRIC BANDING  . OTHER SURGICAL HISTORY     Procedure: OTHER SURGICAL HISTORY (lap ban)  . SHOULDER SURGERY     Procedure: SHOULDER SURGERY  . STOMACH SURGERY     Procedure: STOMACH SURGERY  . TONSILLECTOMY     Procedure: TONSILLECTOMY  . WISDOM TOOTH EXTRACTION     Procedure: WISDOM TOOTH EXTRACTION  [3] No family history on file. [4] Allergies Allergen Reactions  . Morphine Itching and Rash

## 2024-06-21 ENCOUNTER — Other Ambulatory Visit: Payer: Self-pay

## 2024-06-21 ENCOUNTER — Telehealth: Payer: Self-pay | Admitting: Pharmacist

## 2024-06-21 ENCOUNTER — Ambulatory Visit: Admitting: Neurology

## 2024-06-21 ENCOUNTER — Other Ambulatory Visit (HOSPITAL_COMMUNITY): Payer: Self-pay

## 2024-06-21 ENCOUNTER — Telehealth: Payer: Self-pay

## 2024-06-21 ENCOUNTER — Encounter: Payer: Self-pay | Admitting: Neurology

## 2024-06-21 VITALS — BP 122/76 | HR 88 | Ht 72.0 in | Wt 205.4 lb

## 2024-06-21 DIAGNOSIS — G20A1 Parkinson's disease without dyskinesia, without mention of fluctuations: Secondary | ICD-10-CM

## 2024-06-21 DIAGNOSIS — C9110 Chronic lymphocytic leukemia of B-cell type not having achieved remission: Secondary | ICD-10-CM

## 2024-06-21 NOTE — Telephone Encounter (Signed)
 Received telephone call from the patient's spouse/Evon c/o of the cost of the Zanubrutinib  Rx. Spouse states the patient was supposed to start this medication today and the [facility] advised her that this medication would cost them, $10,000 for a 30-day supply. Spouse seeking alternative medication or financial assistance. Spouse is willing to switch pharmacy's if it means they can get this medication at an affordable rate. Spouse would like a callback at her cellphone T#[phone].

## 2024-06-21 NOTE — Telephone Encounter (Signed)
 Oral Chemotherapy Pharmacist Encounter  I spoke with patient's wife, [name], for overview of: Calquence  (acalabrutinib ) for the treatment of CLL, planned duration until disease progression or unacceptable toxicity.   Counseled on administration, dosing, side effects, monitoring, drug-food interactions, safe handling, storage, and disposal.  Patient will take Calquence  100mg  tablets, 1 tablet by mouth approximately 12 hours apart, with or with out food, with a glass of water .  Calquence  start date: 06/23/24 PM  Adverse effects include but are not limited to: headache, diarrhea, fatigue, rash, muscle pain, bruising, decreased blood counts, and altered cardiac conduction.    Reviewed importance of keeping a medication schedule and plan for any missed doses. No barriers to medication adherence identified.  Medication reconciliation performed and medication/allergy list updated.  All questions answered.  Distress thermometer completed during telephone call and reviewed with patient's wife. Due to score, social work referral has been sent.  Patient's wife voiced understanding and appreciation.   Medication education handout placed in mail for patient and patient's wife. Patient knows to call the office with questions or concerns. Oral Chemotherapy Clinic phone number provided to patient.   [name], PharmD, BCPS, BCOP Hematology/Oncology Clinical Pharmacist [phone] 06/21/2024 3:20 PM

## 2024-06-21 NOTE — Progress Notes (Signed)
 Specialty Pharmacy Initial Fill Coordination Note  [name] is a 83 y.o. male contacted today regarding refills of specialty medication(s) Acalabrutinib  Maleate (Calquence ) .  Patient requested Delivery  on 06/23/24  to verified address [address]  [name] [address]   Medication will be filled on 06/21/24.   Patient is aware of $0.00 copayment.

## 2024-06-21 NOTE — Telephone Encounter (Signed)
 Oral Oncology Patient Advocate Encounter  Was successful in securing patient a $8,000 grant from [facility] to provide copayment coverage for Calquence .  This will keep the out of pocket expense at $0.     [employer] ID: [id number]   The billing information is as follows and has been shared with [facility].    RxBin: [id number] PCN: [id number] Member ID: [id number] Group ID: [id number] Dates of Eligibility: 05/22/24 through 05/21/25  Fund:  Chronic Lymphocytic Leukemia   Charlott Hamilton,  CPhT-Adv  she/her/hers [facility]  [facility] Specialty Pharmacy Services Pharmacy Technician Patient Advocate Specialist III [facility] Phone: [phone]  Fax: [phone] [email or web address]

## 2024-06-21 NOTE — Progress Notes (Signed)
 Oral Chemotherapy Pharmacist Encounter  Patient's wife was counseled under telephone encounter from 06/21/24.  Asberry Macintosh, PharmD, BCPS, BCOP Hematology/Oncology Clinical Pharmacist [facility] Oral Chemotherapy Navigation Clinics [phone] 06/21/2024 3:23 PM

## 2024-06-21 NOTE — Patient Instructions (Signed)

## 2024-06-22 ENCOUNTER — Inpatient Hospital Stay: Admitting: Licensed Clinical Social Worker

## 2024-06-22 ENCOUNTER — Other Ambulatory Visit: Payer: Self-pay

## 2024-06-22 DIAGNOSIS — C9110 Chronic lymphocytic leukemia of B-cell type not having achieved remission: Secondary | ICD-10-CM

## 2024-06-22 NOTE — Progress Notes (Signed)
 [facility] Psychosocial Distress Screening Clinical Social Work  [name] is a 83 y.o. year old male. Clinical Social Work was referred by medical provider for positive distress screening. The patient scored a 7 on the Psychosocial Distress Thermometer which indicates moderate distress. Clinical Social Worker contacted caregiver by phone to assess for distress and other psychosocial needs.     Distress Screen:    06/21/2024    3:00 PM  ONCBCN DISTRESS SCREENING  Screening Type Initial Screening  How much distress have you been experiencing in the past week? (0-10) 7     Interventions: Pt's wife reports she is the primary caregiver for pt.  Support is limited and at times it becomes overwhelming.  Pt's wife allowed space to express her feelings w/ emotional support provided.  Pt's wife states she was extremely stressed when they were informed of the cost of the new medication pt will be taking as they would not be able to afford it.  A grant has been approved to assist w/ the cost of the medication which has been a relief.  Pt and wife are connected to Wadie Rung which has been beneficial.  Pt's wife was walking into the dentist's office at the time of our call.  Pt's wife requested contact information for CSW be sent through MyChart for any future needs.  CSW to remain available as appropriate to provide support throughout duration of treatment.    CSW and patient discussed common feeling and emotions when being diagnosed with cancer, and the importance of support during treatment.  CSW informed patient of the support team and support services at [facility].  CSW provided contact information and encouraged patient to call with any questions or concerns.   Follow Up Plan: Patient will contact CSW with any support or resource needs Patient verbalizes understanding of plan: Yes    [name], LCSW

## 2024-06-23 ENCOUNTER — Encounter: Payer: Self-pay | Admitting: Hematology

## 2024-06-23 DIAGNOSIS — I10 Essential (primary) hypertension: Secondary | ICD-10-CM | POA: Diagnosis not present

## 2024-06-23 DIAGNOSIS — I4891 Unspecified atrial fibrillation: Secondary | ICD-10-CM | POA: Diagnosis not present

## 2024-06-23 DIAGNOSIS — I4892 Unspecified atrial flutter: Secondary | ICD-10-CM | POA: Diagnosis not present

## 2024-06-23 DIAGNOSIS — G20A1 Parkinson's disease without dyskinesia, without mention of fluctuations: Secondary | ICD-10-CM | POA: Diagnosis not present

## 2024-06-23 DIAGNOSIS — C9110 Chronic lymphocytic leukemia of B-cell type not having achieved remission: Secondary | ICD-10-CM | POA: Diagnosis not present

## 2024-06-23 DIAGNOSIS — E119 Type 2 diabetes mellitus without complications: Secondary | ICD-10-CM | POA: Diagnosis not present

## 2024-06-24 ENCOUNTER — Other Ambulatory Visit: Payer: Self-pay

## 2024-06-24 ENCOUNTER — Telehealth: Payer: Self-pay | Admitting: Pharmacist

## 2024-06-24 ENCOUNTER — Encounter: Payer: Self-pay | Admitting: Family Medicine

## 2024-06-24 ENCOUNTER — Ambulatory Visit (INDEPENDENT_AMBULATORY_CARE_PROVIDER_SITE_OTHER): Admitting: Family Medicine

## 2024-06-24 VITALS — BP 122/62 | HR 82 | Temp 98.6°F | Ht 72.0 in | Wt 252.4 lb

## 2024-06-24 DIAGNOSIS — E039 Hypothyroidism, unspecified: Secondary | ICD-10-CM | POA: Diagnosis not present

## 2024-06-24 DIAGNOSIS — I10 Essential (primary) hypertension: Secondary | ICD-10-CM | POA: Diagnosis not present

## 2024-06-24 DIAGNOSIS — E785 Hyperlipidemia, unspecified: Secondary | ICD-10-CM | POA: Diagnosis not present

## 2024-06-24 DIAGNOSIS — E119 Type 2 diabetes mellitus without complications: Secondary | ICD-10-CM | POA: Diagnosis not present

## 2024-06-24 DIAGNOSIS — E11319 Type 2 diabetes mellitus with unspecified diabetic retinopathy without macular edema: Secondary | ICD-10-CM | POA: Diagnosis not present

## 2024-06-24 DIAGNOSIS — Z794 Long term (current) use of insulin: Secondary | ICD-10-CM

## 2024-06-24 DIAGNOSIS — I4891 Unspecified atrial fibrillation: Secondary | ICD-10-CM | POA: Diagnosis not present

## 2024-06-24 DIAGNOSIS — E1169 Type 2 diabetes mellitus with other specified complication: Secondary | ICD-10-CM | POA: Diagnosis not present

## 2024-06-24 DIAGNOSIS — I4892 Unspecified atrial flutter: Secondary | ICD-10-CM | POA: Diagnosis not present

## 2024-06-24 DIAGNOSIS — C9110 Chronic lymphocytic leukemia of B-cell type not having achieved remission: Secondary | ICD-10-CM | POA: Diagnosis not present

## 2024-06-24 DIAGNOSIS — G20A1 Parkinson's disease without dyskinesia, without mention of fluctuations: Secondary | ICD-10-CM | POA: Diagnosis not present

## 2024-06-24 MED ORDER — ZANUBRUTINIB 80 MG PO CAPS: 160.0000 mg | ORAL_CAPSULE | Freq: Two times a day (BID) | ORAL | 1 refills | Status: DC

## 2024-06-24 MED ORDER — CALQUENCE 100 MG PO CAPS: 100.0000 mg | ORAL_CAPSULE | Freq: Two times a day (BID) | ORAL | 1 refills | Status: DC

## 2024-06-24 NOTE — Patient Instructions (Addendum)
 Thanks for bringing labs- we can give you back original  Rooting for you during this hard time!   Recommended follow up: Return in about 4 months (around 10/25/2024) for followup or sooner if needed.Schedule b4 you leave.

## 2024-06-24 NOTE — Telephone Encounter (Signed)
 Oral Oncology Pharmacist Encounter  Notified that patient's wife had contacted [facility]including [facility] trying to get Calquence  (and Brukinsa ) filled.   I called and spoke with pharmacist, Rock, at [facility] pharmacy in [place] and explained that patient's insurance had approved Calquence  (Brukinsa  was denied as Calquence  is preferred agent per patient's formulary), and patient would be obtaining this through [facility]. She was going to make note in their system to not process any other request for Calquence  or Brukinsa , as patient has already had Calquence  delivered to their home on 06/23/24.  I called patient's wife and explained, again, in detail that [name] would be getting the Calquence  for $0 each month from [facility]. I explained that the specialty pharmacy would call her every month 7-10 days prior to medication being due for refill and set up delivery to her home. I explained multiple times that this medication will NOT be filled through the [place] as we have it approved for $0 cost through [facility] until at least June of 2026. Patient's wife informed she does not need to reach out to the [place] pharmacy regarding [name]'s cancer medications as they are being handled by [facility].   Patient's wife expressed understanding of the above. She confirmed they had received the Calquence  to their home on 06/23/24 and she gave patient his first dose on 06/23/24 PM.  Asberry Macintosh, PharmD, BCPS, BCOP Hematology/Oncology Clinical Pharmacist [phone] 06/24/2024 2:26 PM

## 2024-06-24 NOTE — Addendum Note (Signed)
 Addended by: KALLIE RAEANNE DEL on: 06/24/2024 12:25 PM   Modules accepted: Orders

## 2024-06-24 NOTE — Progress Notes (Signed)
 Phone [phone] In person visit   Subjective:   [name] is a 83 y.o. 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    Priority: High   Chronic bilateral low back pain without sciatica 12/12/2015    Priority: High   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    Priority: High   History of prostate cancer 01/24/2014    Priority: High   Type 2 diabetes mellitus 09/24/2007    Priority: High   Diabetic retinopathy 09/24/2007    Priority: High   PTSD (post-traumatic stress disorder) 07/30/2022    Priority: Medium    Macular degeneration     Priority: Medium    Hypothyroidism 06/22/2021    Priority: Medium    Vitamin D  deficiency 11/01/2020    Priority: Medium    Tremor 08/11/2015    Priority: Medium    Lapband APL + HH repair 08/12/2013    Priority: Medium    Insomnia 09/21/2009    Priority: Medium    Hyperlipidemia associated with type 2 diabetes mellitus 09/24/2007    Priority: Medium    OSA on CPAP 09/24/2007    Priority: Medium    Essential hypertension 09/24/2007    Priority: Medium    Bell's palsy 05/08/2020    Priority: Low   Temporomandibular joint (TMJ) pain 04/14/2017    Priority: Low   GERD (gastroesophageal reflux disease) 12/08/2014    Priority: Low   History of colonic polyps 08/22/2008    Priority: Low   Chronic right hip pain 09/27/2019    Priority: 1.   Sacroiliac joint dysfunction 06/29/2018    Priority: 1.   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 myelopathy or radiculopathy 02/01/2016     Priority: 1.   AKI (acute kidney injury) (HCC) 08/08/2023   LBBB (left bundle branch block) 08/08/2023   Major depressive disorder 07/30/2022   Gynecomastia 12/28/2020    Medications- reviewed and updated Current Outpatient Medications  Medication Sig Dispense Refill   zanubrutinib  (BRUKINSA ) 80 MG capsule Take 160 mg by mouth 2 (two) times daily.     acalabrutinib  maleate (CALQUENCE ) 100 MG tablet Take 1 tablet (100 mg total) by mouth 2 (two) times daily. 60 tablet 1   amLODipine  (NORVASC ) 10 MG tablet Take 10 mg by mouth daily.     busPIRone  (BUSPAR ) 10 MG tablet Take by mouth 3 (three) times daily.     carbidopa -levodopa  (SINEMET  IR) 25-100 MG tablet Take 1 tablet by mouth 3 (three) times daily. 270 tablet 1   Cholecalciferol (VITAMIN D3) 50 MCG (2000 UT) TABS Take 1 tablet by mouth daily.     empagliflozin  (JARDIANCE ) 25 MG TABS tablet Take 1 tablet (25 mg total) by mouth daily before breakfast. ([facility]) 30 tablet    escitalopram (LEXAPRO) 10 MG tablet Take 10 mg by mouth daily.     gabapentin  (NEURONTIN ) 300 MG capsule Take 600 mg by mouth 3 (three) times daily.     glucose blood test strip Use to test 4 times daily. E11.9 100  each 12   hydrochlorothiazide (HYDRODIURIL) 25 MG tablet Take 25 mg by mouth daily.     insulin  aspart (NOVOLOG ) 100 UNIT/ML injection Inject 35 Units into the skin 3 (three) times daily with meals. When sugar is elevated     insulin  glargine (LANTUS ) 100 UNIT/ML injection Inject 50 Units into the skin at bedtime.     levothyroxine  (SYNTHROID , LEVOTHROID) 50 MCG tablet Take 50 mcg by mouth daily before breakfast.     losartan  (COZAAR ) 100 MG tablet Take 100 mg by mouth daily.     mirtazapine  (REMERON ) 15 MG tablet Take 15 mg by mouth at bedtime.     neomycin -polymyxin b-dexamethasone  (MAXITROL ) 3.5-10000-0.1 SUSP Place 1 drop into both eyes 3 (three) times daily.     OVER THE COUNTER MEDICATION PreserVision for eyes     potassium chloride  SA  (K-DUR,KLOR-CON ) 20 MEQ tablet Take 20 mEq by mouth daily.     propranolol  (INDERAL ) 40 MG tablet Take 40 mg by mouth 3 (three) times daily.     simvastatin  (ZOCOR ) 80 MG tablet Take 40 mg by mouth daily at 6 PM. Take half tablet (40 mg) by mouth once daily     sucralfate  (CARAFATE ) 1 G tablet Take 1 g by mouth 4 (four) times daily -  with meals and at bedtime.     terbinafine  (LAMISIL ) 1 % cream Apply 1 Application topically 2 (two) times daily.     traMADol  (ULTRAM ) 50 MG tablet Take 1 tablet (50 mg total) by mouth every 6 (six) hours as needed for moderate pain (pain score 4-6). 30 tablet 5   zolpidem  (AMBIEN ) 5 MG tablet Take 1 tablet (5 mg total) by mouth at bedtime as needed for sleep. 30 tablet 5   No current facility-administered medications for this visit.     Objective:  BP 122/62   Pulse 82   Temp 98.6 F (37 C)   Ht 6' (1.829 m)   Wt 252 lb 6.4 oz (114.5 kg)   SpO2 96%   BMI 34.23 kg/m  Gen: NAD, resting comfortably CV: RRR no murmurs rubs or gallops Lungs: CTAB no crackles, wheeze, rhonchi Ext: trace edema Skin: warm, dry     Assessment and Plan   #Parkinson's follows with Dr. Evonnie with MCI and significant tremor S: Medication: None  -Sinemet  he declines IN PAST-carbidopa  levodopa  has not been covered- trying to get covered -Mild cognitive impairment diagnosed 07/30/2022 by Arthea Maryland, PhD - procedure with [facility] focused us  of left halamus for right tremor with significant improvement January 2024 (he's right handed)- but balance issues after A/P: ongoing tremor issues- working with Dr. Evonnie. Some MCI from this as well- leaning on wife for support.  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 Echocardiogram reassuring  -initially thought to be atrial flutter- cardiology stated no a flutter   % Patient with known CLL-was following with Dr. Amadeo, now Dr. Lanny  -restarted treatment with brukinska 80 mg in 2025- just  started wednesday and calquence  listed but not taking. Grant for 30 days but trying to get through [place] as well -close monthly follow up with Dr. Lanny  #Hyperlipidemia S: Controlled on simvastatin  40 mg-half of 80 mg tablet.  with triglycerides up to 379-currently on fenofibrate as well Lab Results  Component Value Date   CHOL 94 04/07/2024   HDL 29 (A) 04/07/2024   LDLCALC 34 04/07/2024   LDLDIRECT 55.0 04/22/2022   TRIG  221 (A) 03/26/2023   CHOLHDL 3 04/22/2022  A/P: lipids well controlled continue current medications    #Hypertension S: Controlled on hydrochlorothiazide 25 mg, losartan  100 mg, amlodipine  10 mg (some edema in past-doing ok today). Got lower at home one day- about down to 100 at home -propranolol  for tremor lowered HR today so he stopped this in past- appears on 40 mg three times daily in 2025 - tolerating today BP Readings from Last 3 Encounters:  06/24/24 122/62  06/21/24 122/76  06/17/24 110/60  A/P:  blood pressure well controlled - we offered to lower amlodipine  to 5 mg but declines for now.   #Diabetes type 2 with ophthalmic complications- lifestyle meter and freestyle. A1c goal 7.5 or less. S: [facility] -inslulin glargin 50 units--> 80 units. Did have one reading as low as 60 but did orange juice -jardiance  25 mg daily -novolog   35-40 Lab Results  Component Value Date   HGBA1C 7.7 06/10/2024   HGBA1C 7.7 04/07/2024   HGBA1C 7.5 (H) 01/28/2024   A/P: reasonable control in light of other illness that he is dealing with- we will continue current medications and [facility]  -no microalbuminuria with ratio of only 4.7   #Hypothyroidism  S: Compliant with levothyroxine  50 mcg  Lab Results  Component Value Date   TSH 1.40 01/28/2024  A/P: stable- continue current medicines   #insomnia- on Remeron  15 mg, generic Ambien  (increases fall risk- discussed today) 5 mg - buspirone  10 mg  3x a day for anxiety as well -also on Lexapro 10  mg - through [place]- warned of serotonin syndrome risk- through [place]  #bilateral low back pain- works with Dr. Billie- we prescription tramadol  to help him with this - sparingly at this point  Recommended follow up: Return in about 4 months (around 10/25/2024) for followup or sooner if needed.Schedule b4 you leave. Future Appointments  Date Time Provider Department Center  06/25/2024  7:30 AM Alvia Norleen BIRCH, MD [facility] None  07/15/2024  1:00 PM [facility] None  07/15/2024  1:40 PM Lanny Callander, MD [facility] None  07/15/2024  3:00 PM [facility] CLINICAL SUPPORT [facility]  09/16/2024 10:30 AM [facility] None  09/16/2024 11:00 AM Lanny Callander, MD [facility] None  11/11/2024  3:00 PM Tat, Asberry RAMAN, DO [facility] None  05/30/2025 11:20 AM [facility] ANNUAL WELLNESS VISIT 1 [facility]    Lab/Order associations:   ICD-10-CM   1. Type 2 diabetes mellitus with retinopathy of both eyes, with long-term current use of insulin , macular edema presence unspecified, unspecified retinopathy severity (HCC)  E11.319    Z79.4     2. Essential hypertension  I10     3. Hyperlipidemia associated with type 2 diabetes mellitus  E11.69    E78.5     4. Hypothyroidism, unspecified type  E03.9      No orders of the defined types were placed in this encounter.   Return precautions advised.  Garnette Lukes, MD

## 2024-06-25 ENCOUNTER — Encounter: Payer: Self-pay | Admitting: Neurology

## 2024-06-25 ENCOUNTER — Other Ambulatory Visit: Payer: Self-pay

## 2024-06-25 ENCOUNTER — Encounter (INDEPENDENT_AMBULATORY_CARE_PROVIDER_SITE_OTHER): Admitting: Ophthalmology

## 2024-06-25 ENCOUNTER — Telehealth: Payer: Self-pay | Admitting: Neurology

## 2024-06-25 DIAGNOSIS — H43813 Vitreous degeneration, bilateral: Secondary | ICD-10-CM | POA: Diagnosis not present

## 2024-06-25 DIAGNOSIS — H35033 Hypertensive retinopathy, bilateral: Secondary | ICD-10-CM

## 2024-06-25 DIAGNOSIS — G20A1 Parkinson's disease without dyskinesia, without mention of fluctuations: Secondary | ICD-10-CM

## 2024-06-25 DIAGNOSIS — H353231 Exudative age-related macular degeneration, bilateral, with active choroidal neovascularization: Secondary | ICD-10-CM | POA: Diagnosis not present

## 2024-06-25 DIAGNOSIS — I10 Essential (primary) hypertension: Secondary | ICD-10-CM | POA: Diagnosis not present

## 2024-06-25 MED ORDER — CARBIDOPA-LEVODOPA 25-100 MG PO TABS: 1.0000 | ORAL_TABLET | Freq: Three times a day (TID) | ORAL | 1 refills | Status: AC

## 2024-06-25 NOTE — Telephone Encounter (Signed)
 Staff message sent to [facility] and Camie [place]

## 2024-06-25 NOTE — Telephone Encounter (Signed)
 Pt wife called and LM. They are having a hard time getting the form scanned for the parkinson event. Would like to talk with someone

## 2024-06-25 NOTE — Telephone Encounter (Signed)
 Please see patient message.

## 2024-06-26 DIAGNOSIS — G20A1 Parkinson's disease without dyskinesia, without mention of fluctuations: Secondary | ICD-10-CM | POA: Diagnosis not present

## 2024-06-26 DIAGNOSIS — I4892 Unspecified atrial flutter: Secondary | ICD-10-CM | POA: Diagnosis not present

## 2024-06-26 DIAGNOSIS — F419 Anxiety disorder, unspecified: Secondary | ICD-10-CM | POA: Diagnosis not present

## 2024-06-26 DIAGNOSIS — Z9181 History of falling: Secondary | ICD-10-CM | POA: Diagnosis not present

## 2024-06-26 DIAGNOSIS — E119 Type 2 diabetes mellitus without complications: Secondary | ICD-10-CM | POA: Diagnosis not present

## 2024-06-26 DIAGNOSIS — Z794 Long term (current) use of insulin: Secondary | ICD-10-CM | POA: Diagnosis not present

## 2024-06-26 DIAGNOSIS — Z7984 Long term (current) use of oral hypoglycemic drugs: Secondary | ICD-10-CM | POA: Diagnosis not present

## 2024-06-26 DIAGNOSIS — I4891 Unspecified atrial fibrillation: Secondary | ICD-10-CM | POA: Diagnosis not present

## 2024-06-26 DIAGNOSIS — I10 Essential (primary) hypertension: Secondary | ICD-10-CM | POA: Diagnosis not present

## 2024-06-26 DIAGNOSIS — C9110 Chronic lymphocytic leukemia of B-cell type not having achieved remission: Secondary | ICD-10-CM | POA: Diagnosis not present

## 2024-06-29 DIAGNOSIS — I10 Essential (primary) hypertension: Secondary | ICD-10-CM | POA: Diagnosis not present

## 2024-06-29 DIAGNOSIS — I4891 Unspecified atrial fibrillation: Secondary | ICD-10-CM | POA: Diagnosis not present

## 2024-06-29 DIAGNOSIS — G20A1 Parkinson's disease without dyskinesia, without mention of fluctuations: Secondary | ICD-10-CM | POA: Diagnosis not present

## 2024-06-29 DIAGNOSIS — E119 Type 2 diabetes mellitus without complications: Secondary | ICD-10-CM | POA: Diagnosis not present

## 2024-06-29 DIAGNOSIS — I4892 Unspecified atrial flutter: Secondary | ICD-10-CM | POA: Diagnosis not present

## 2024-06-29 DIAGNOSIS — C9110 Chronic lymphocytic leukemia of B-cell type not having achieved remission: Secondary | ICD-10-CM | POA: Diagnosis not present

## 2024-07-06 DIAGNOSIS — C9110 Chronic lymphocytic leukemia of B-cell type not having achieved remission: Secondary | ICD-10-CM | POA: Diagnosis not present

## 2024-07-06 DIAGNOSIS — I4892 Unspecified atrial flutter: Secondary | ICD-10-CM | POA: Diagnosis not present

## 2024-07-06 DIAGNOSIS — G20A1 Parkinson's disease without dyskinesia, without mention of fluctuations: Secondary | ICD-10-CM | POA: Diagnosis not present

## 2024-07-06 DIAGNOSIS — I10 Essential (primary) hypertension: Secondary | ICD-10-CM | POA: Diagnosis not present

## 2024-07-06 DIAGNOSIS — I4891 Unspecified atrial fibrillation: Secondary | ICD-10-CM | POA: Diagnosis not present

## 2024-07-06 DIAGNOSIS — E119 Type 2 diabetes mellitus without complications: Secondary | ICD-10-CM | POA: Diagnosis not present

## 2024-07-12 ENCOUNTER — Other Ambulatory Visit: Payer: Self-pay

## 2024-07-12 DIAGNOSIS — C9110 Chronic lymphocytic leukemia of B-cell type not having achieved remission: Secondary | ICD-10-CM | POA: Diagnosis not present

## 2024-07-12 DIAGNOSIS — I4892 Unspecified atrial flutter: Secondary | ICD-10-CM | POA: Diagnosis not present

## 2024-07-12 DIAGNOSIS — I4891 Unspecified atrial fibrillation: Secondary | ICD-10-CM | POA: Diagnosis not present

## 2024-07-12 DIAGNOSIS — I10 Essential (primary) hypertension: Secondary | ICD-10-CM | POA: Diagnosis not present

## 2024-07-12 DIAGNOSIS — E119 Type 2 diabetes mellitus without complications: Secondary | ICD-10-CM | POA: Diagnosis not present

## 2024-07-12 DIAGNOSIS — G20A1 Parkinson's disease without dyskinesia, without mention of fluctuations: Secondary | ICD-10-CM | POA: Diagnosis not present

## 2024-07-14 ENCOUNTER — Other Ambulatory Visit: Payer: Self-pay

## 2024-07-14 NOTE — Progress Notes (Signed)
 Specialty Pharmacy Refill Coordination Note  [name] is a 83 y.o. male contacted today regarding refills of specialty medication(s) Acalabrutinib  Maleate (Calquence )   Patient requested Delivery   Delivery date: 07/16/24   Verified address: [address]  [place] [address]   Medication will be filled on 07/15/24.

## 2024-07-14 NOTE — Progress Notes (Signed)
 Specialty Pharmacy Ongoing Clinical Assessment Note  [name] is a 83 y.o. male who is being followed by the specialty pharmacy service for RxSp Oncology   Patient's specialty medication(s) reviewed today: Acalabrutinib  Maleate (Calquence )   Missed doses in the last 4 weeks: 0   Patient/Caregiver did not have any additional questions or concerns.   Therapeutic benefit summary: Unable to assess   Adverse events/side effects summary: No adverse events/side effects   Patient's therapy is appropriate to: Continue    Goals Addressed             This Visit's Progress    Slow Disease Progression       Patient is unable to be assessed as therapy was recently initiated. Patient will maintain adherence         Follow up: 3 months  [name] Specialty Pharmacist

## 2024-07-15 ENCOUNTER — Ambulatory Visit

## 2024-07-15 ENCOUNTER — Inpatient Hospital Stay: Attending: Hematology

## 2024-07-15 ENCOUNTER — Inpatient Hospital Stay (HOSPITAL_BASED_OUTPATIENT_CLINIC_OR_DEPARTMENT_OTHER): Admitting: Hematology

## 2024-07-15 VITALS — BP 112/62 | HR 69 | Temp 98.7°F | Resp 17 | Ht 72.0 in | Wt 241.9 lb

## 2024-07-15 DIAGNOSIS — R590 Localized enlarged lymph nodes: Secondary | ICD-10-CM | POA: Insufficient documentation

## 2024-07-15 DIAGNOSIS — C4A61 Merkel cell carcinoma of right upper limb, including shoulder: Secondary | ICD-10-CM

## 2024-07-15 DIAGNOSIS — E538 Deficiency of other specified B group vitamins: Secondary | ICD-10-CM

## 2024-07-15 DIAGNOSIS — G20A1 Parkinson's disease without dyskinesia, without mention of fluctuations: Secondary | ICD-10-CM | POA: Insufficient documentation

## 2024-07-15 DIAGNOSIS — Z9081 Acquired absence of spleen: Secondary | ICD-10-CM | POA: Insufficient documentation

## 2024-07-15 DIAGNOSIS — R160 Hepatomegaly, not elsewhere classified: Secondary | ICD-10-CM | POA: Diagnosis not present

## 2024-07-15 DIAGNOSIS — C9110 Chronic lymphocytic leukemia of B-cell type not having achieved remission: Secondary | ICD-10-CM | POA: Diagnosis not present

## 2024-07-15 LAB — CBC WITH DIFFERENTIAL (CANCER CENTER ONLY)
Abs Immature Granulocytes: 0.44 K/uL — ABNORMAL HIGH (ref 0.00–0.07)
Basophils Absolute: 0.1 K/uL (ref 0.0–0.1)
Basophils Relative: 0 %
Eosinophils Absolute: 0.3 K/uL (ref 0.0–0.5)
Eosinophils Relative: 0 %
HCT: 45.9 % (ref 39.0–52.0)
Hemoglobin: 14.5 g/dL (ref 13.0–17.0)
Immature Granulocytes: 0 %
Lymphocytes Relative: 92 %
Lymphs Abs: 130.9 K/uL — ABNORMAL HIGH (ref 0.7–4.0)
MCH: 27.8 pg (ref 26.0–34.0)
MCHC: 31.6 g/dL (ref 30.0–36.0)
MCV: 88.1 fL (ref 80.0–100.0)
Monocytes Absolute: 2.4 K/uL — ABNORMAL HIGH (ref 0.1–1.0)
Monocytes Relative: 2 %
Neutro Abs: 7.7 K/uL (ref 1.7–7.7)
Neutrophils Relative %: 6 %
Platelet Count: 213 K/uL (ref 150–400)
RBC: 5.21 MIL/uL (ref 4.22–5.81)
RDW: 15.2 % (ref 11.5–15.5)
WBC Count: 141.9 K/uL (ref 4.0–10.5)
nRBC: 0 % (ref 0.0–0.2)

## 2024-07-15 LAB — CMP (CANCER CENTER ONLY)
ALT: 15 U/L (ref 0–44)
AST: 17 U/L (ref 15–41)
Albumin: 4.5 g/dL (ref 3.5–5.0)
Alkaline Phosphatase: 52 U/L (ref 38–126)
Anion gap: 8 (ref 5–15)
BUN: 20 mg/dL (ref 8–23)
CO2: 28 mmol/L (ref 22–32)
Calcium: 9 mg/dL (ref 8.9–10.3)
Chloride: 104 mmol/L (ref 98–111)
Creatinine: 1.26 mg/dL — ABNORMAL HIGH (ref 0.61–1.24)
GFR, Estimated: 58 mL/min — ABNORMAL LOW (ref >60)
Glucose, Bld: 169 mg/dL — ABNORMAL HIGH (ref 70–99)
Potassium: 4 mmol/L (ref 3.5–5.1)
Sodium: 140 mmol/L (ref 135–145)
Total Bilirubin: 1.4 mg/dL — ABNORMAL HIGH (ref 0.0–1.2)
Total Protein: 6.4 g/dL — ABNORMAL LOW (ref 6.5–8.1)

## 2024-07-15 MED ORDER — CYANOCOBALAMIN 1000 MCG/ML IJ SOLN
1000.0000 ug | Freq: Once | INTRAMUSCULAR | Status: AC
  Administered 2024-07-15: 1000 ug via INTRAMUSCULAR

## 2024-07-15 MED ORDER — CALQUENCE 100 MG PO TABS: 100.0000 mg | ORAL_TABLET | Freq: Two times a day (BID) | ORAL | 2 refills | Status: DC

## 2024-07-15 NOTE — Progress Notes (Signed)
.  Patient is in office today for a nurse visit for B12 Injection, per Dr. Carolee order. Patient Injection was given in the  Right deltoid. Patient tolerated injection well. No complaints

## 2024-07-15 NOTE — Progress Notes (Signed)
 Critical lab value reported: WBC 141.9 pt has CLL  Verbally notified Dr. Lanny of the lab value.

## 2024-07-15 NOTE — Assessment & Plan Note (Signed)
-  Diagnosed in 2015 after presenting with lymphocytosis and adenopathy -Stage 0, on active surveillance, has not required any treatment. 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 Results for CCND1/IGH, ATM, chromosome 12, and TP53 were normal.  -CT 04/09/2024 showed diffuse adenopathy in neck, abdomen and pelvis, up to 2 cm, new splenectomy and persistent hepatomegaly. - He started acalabrutinib  in late July 2025

## 2024-07-15 NOTE — Progress Notes (Signed)
 [facility]   [phone] [phone]   Clinic Follow up Note   Patient Care Team: Katrinka Garnette KIDD, MD as PCP - General (Family Medicine) Alvia Norleen BIRCH, MD as Consulting Physician (Ophthalmology) Livingston Rigg, MD as Consulting Physician (Dermatology) Nicholaus Sherlean CROME, [facility] (Inactive) (Pharmacist) Lanny Callander, MD as Consulting Physician (Hematology and Oncology) Kate Lonni CROME, MD as Consulting Physician (Cardiology)  Date of Service:  07/15/2024  CHIEF COMPLAINT: f/u of CLL  CURRENT THERAPY:  Acalabrutinib  100 mg twice daily  Oncology History   Chronic lymphocytic leukemia (HCC) -Diagnosed in 2015 after presenting with lymphocytosis and adenopathy -Stage 0, on active surveillance, has not required any treatment. 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 Results for CCND1/IGH, ATM, chromosome 12, and TP53 were normal.  -CT 04/09/2024 showed diffuse adenopathy in neck, abdomen and pelvis, up to 2 cm, new splenectomy and persistent hepatomegaly. 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 White blood cell count increased from 94K to 141K due to medication-induced flare, releasing leukemia cells from bone marrow into blood. This is expected and does not indicate worsening of CLL. Anticipated improving white blood cell count in five to six months. - Continue acalabrutinib  at 100 mg twice daily, he is tolerating well - Ensure adequate hydration to facilitate elimination of dying leukemia cells. - Call in a three-month supply of medication with two refills to [facility] pharmacy.  Bruising Bruising has worsened, likely due to the initiation of daily  baby aspirin, which is known to cause bruising.  Parkinson disease - Follow-up with Dr. Evonnie  B12 deficiency - Gets B12 injection monthly at PCP Dr. Carolee office  Plan - Patient is tolerating acalabrutinib  very well, will continue - Lab and follow-up in 2 months as scheduled    Discussed the use of AI scribe software for clinical note transcription with the patient, who gave verbal consent to proceed.  History of Present Illness [name] is an 83 year old male with chronic lymphocytic leukemia (CLL) who presents for follow-up.  He has been on a new medication for CLL for the past three weeks. Increased bruising is noted, which he attributes to starting a daily baby aspirin. He experiences some diarrhea, described as mild. No pain is present.  His white blood cell count has increased since starting the medication, with the most recent count being 141, up from 94 previously, and 70-80 before that.  He receives a B12 injection once a month and plans to continue this regimen.     All other systems were reviewed with the patient and are negative.  MEDICAL HISTORY:  Past Medical History:  Diagnosis Date   Arthritis    Basal cell carcinoma 03/18/2017   left neck CX3 5FU   Bell's palsy 05/08/2020   Cataract    Chronic bilateral low back pain without sciatica 12/12/2015   Chronic lymphocytic leukemia 05/16/2014   Oncology q6 months. Thought to be agent orange related-CLL and prostate cancer  Diagnosis 1. 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 - NO EVIDENCE OF EXTRAPROSTATIC EXTENSION, ANGIOLYMPHATIC INVASION OR SEMINAL VESICLE INVOLVEMENT IDENTIFIED. - RESECTION MARGINS, NEGATIVE FOR ATYPIA OR MALIGNANCY. 2. Lymph nodes, regional  resection, righ   Chronic pain syndrome 02/01/2016   Chronic right hip pain 09/27/2019   DDD (degenerative disc disease), lumbar    Diabetic retinopathy 09/24/2007    Diverticulosis of colon    Elevated PSA    Erectile dysfunction 12/30/2007   No rx.      GERD (gastroesophageal reflux disease)    Gynecomastia 12/28/2020   History of colonic polyps 08/22/2008   2004 3 adenomas 2009 none 2013 none 02/26/2017 8 mm sigmoid polyp was inflammatory - no recall planned given age/co-morbidities overall hx of polyps     History of prostate cancer 01/24/2014   Follows with Dr. Brunetta every 3 months. Dr. Amadeo every 6 months.  Diagnosis 1. 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 - NO EVIDENCE OF EXTRAPROSTATIC EXTENSION, ANGIOLYMPHATIC INVASION OR SEMINAL VESICLE INVOLVEMENT IDENTIFIED. - RESECTION MARGINS, NEGATIVE FOR ATYPIA OR MALIGNANCY. 2. Lymph nodes, regional resection, right pelvic - SIX   Hyperlipidemia associated with type 2 diabetes mellitus 09/24/2007   High triglycerides. Refused statin due to myalgias in past but then started half tablet of simvastatin  40mg  through [facility] of this note might be different from the original. Formatting of this note might be different from the original. High triglycerides. Refused statin due to myalgias in past but then started half tablet of simvastatin  40mg  through [place]  Last Assessment & Plan:  Formatting    Hypertension associated with diabetes 09/24/2007   Amlodipine  10mg , losartan  100mg , HCTZ 25mg , propranolol  40 mg twice a day Home cuff 142/85 compared to 134/72 on my check. Likely home cuff about 10 points higher.   --> chlorthalidone  03/20/16 but patient didn't change and then BP controlled on next visit   Formatting of this note might be different from the original. Formatting of this note might be different from the original. 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  Follow up in 6 months unless wound issues arise.  Derm follow up in October 2022 scheduled.  I will see him back in 6 months.   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 a1c under 8     Formatting of this note might be different from the original. Formatting of this note might be different from the original. And macular degeneration.   Last Assessment & Plan:  Formatting of this note might be different from the original. S: diabetic retinopathy followed up by optho yesterday and largely stable. He also has m   Vitamin D  deficiency 11/01/2020   At [facility]- on 2000 units a day at least since 2021     SURGICAL HISTORY: Past  Surgical History:  Procedure Laterality Date   CHOLECYSTECTOMY     COLONOSCOPY  03/25/12, 09/28/08   ESOPHAGOGASTRODUODENOSCOPY N/A 10/12/2014   Procedure: ESOPHAGOGASTRODUODENOSCOPY (EGD);  Surgeon: Lupita FORBES Commander, MD;  Location: [facility];  Service: Endoscopy;  Laterality: N/A;   ESOPHAGOGASTRODUODENOSCOPY ENDOSCOPY  09/28/08   EXCISION MELANOMA WITH SENTINEL LYMPH NODE BIOPSY Right 05/31/2021   Procedure: WIDE LOCAL EXCISION RIGHT FOREARM WITH ADVANCEDMENT FLAP CLOSURE WITH SENTINEL LYMPH NODE MAPPING AND BIOSPY;  Surgeon: Aron Shoulders, MD;  Location: [facility];  Service: General;  Laterality: Right;   FOOT SURGERY Bilateral    HEMORRHOID  SURGERY     LAPAROSCOPIC GASTRIC BANDING  03/05/11   weight loss   LAPAROSCOPIC GASTRIC BANDING WITH HIATAL HERNIA REPAIR  03/05/2011   LYMPHADENECTOMY Bilateral 01/24/2014   Procedure: LYMPHADENECTOMY;  Surgeon: Noretta Ferrara, MD;  Location: [facility];  Service: Urology;  Laterality: Bilateral;   ORIF TIBIA FRACTURE Right    PENILE PROSTHESIS IMPLANT     PROSTATE SURGERY  01/2014   ROBOT ASSISTED LAPAROSCOPIC RADICAL PROSTATECTOMY N/A 01/24/2014   Procedure: ROBOTIC ASSISTED LAPAROSCOPIC RADICAL PROSTATECTOMY LEVEL 3;  Surgeon: Noretta Ferrara, MD;  Location: [facility];  Service: Urology;  Laterality: N/A;   SHOULDER SURGERY Right 2011   TONSILLECTOMY     age 81   VASECTOMY      I have reviewed the social history and family history with the patient and they are unchanged from previous note.  ALLERGIES:  is allergic to morphine sulfate.  MEDICATIONS:  Current Outpatient Medications  Medication Sig Dispense Refill   acalabrutinib  maleate (CALQUENCE ) 100 MG tablet Take 1 tablet (100 mg total) by mouth 2 (two) times daily. 60 tablet 2   amLODipine  (NORVASC ) 10 MG tablet Take 10 mg by mouth daily.     aspirin EC 81 MG tablet Take 81 mg by mouth daily. Swallow whole.     busPIRone  (BUSPAR ) 10 MG tablet Take by mouth 3 (three) times daily.     carbidopa -levodopa  (SINEMET  IR) 25-100 MG tablet Take 1 tablet by mouth 3 (three) times daily. 270 tablet 1   Cholecalciferol (VITAMIN D3) 50 MCG (2000 UT) TABS Take 1 tablet by mouth daily.     empagliflozin  (JARDIANCE ) 25 MG TABS tablet Take 1 tablet (25 mg total) by mouth daily before breakfast. (GIVEN BY [facility]) 30 tablet    escitalopram (LEXAPRO) 10 MG tablet Take 10 mg by mouth daily.     gabapentin  (NEURONTIN ) 300 MG capsule Take 600 mg by mouth 3 (three) times daily.     glucose blood test strip Use to test 4 times daily. E11.9 100 each 12   hydrochlorothiazide (HYDRODIURIL) 25 MG tablet Take 25 mg by mouth daily.     insulin  aspart (NOVOLOG ) 100 UNIT/ML injection  Inject 35 Units into the skin 3 (three) times daily with meals. When sugar is elevated     insulin  glargine (LANTUS ) 100 UNIT/ML injection Inject 50 Units into the skin at bedtime.     levothyroxine  (SYNTHROID , LEVOTHROID) 50 MCG tablet Take 50 mcg by mouth daily before breakfast.     losartan  (COZAAR ) 100 MG tablet Take 100 mg by mouth daily.     mirtazapine  (REMERON ) 15 MG tablet Take 15 mg by mouth at bedtime.     neomycin -polymyxin b-dexamethasone  (MAXITROL ) 3.5-10000-0.1 SUSP Place 1 drop into both eyes 3 (three) times daily.     OVER THE COUNTER MEDICATION PreserVision for eyes     potassium chloride  SA (K-DUR,KLOR-CON ) 20 MEQ  tablet Take 20 mEq by mouth daily.     propranolol  (INDERAL ) 40 MG tablet Take 40 mg by mouth 3 (three) times daily.     simvastatin  (ZOCOR ) 80 MG tablet Take 40 mg by mouth daily at 6 PM. Take half tablet (40 mg) by mouth once daily     sucralfate  (CARAFATE ) 1 G tablet Take 1 g by mouth 4 (four) times daily -  with meals and at bedtime.     terbinafine  (LAMISIL ) 1 % cream Apply 1 Application topically 2 (two) times daily.     traMADol  (ULTRAM ) 50 MG tablet Take 1 tablet (50 mg total) by mouth every 6 (six) hours as needed for moderate pain (pain score 4-6). 30 tablet 5   zolpidem  (AMBIEN ) 5 MG tablet Take 1 tablet (5 mg total) by mouth at bedtime as needed for sleep. 30 tablet 5   No current facility-administered medications for this visit.    PHYSICAL EXAMINATION: ECOG PERFORMANCE STATUS: 2 - Symptomatic, <50% confined to bed  Vitals:   07/15/24 1257  BP: 112/62  Pulse: 69  Resp: 17  Temp: 98.7 F (37.1 C)  SpO2: 98%   Wt Readings from Last 3 Encounters:  07/15/24 241 lb 14.4 oz (109.7 kg)  06/24/24 252 lb 6.4 oz (114.5 kg)  06/21/24 205 lb 6.4 oz (93.2 kg)     GENERAL:alert, no distress and comfortable SKIN: skin color, texture, turgor are normal, no rashes or significant lesions EYES: normal, Conjunctiva are pink and non-injected, sclera  clear NECK: supple, thyroid  normal size, non-tender, without nodularity LYMPH:  no palpable lymphadenopathy in the cervical, axillary  LUNGS: clear to auscultation and percussion with normal breathing effort HEART: regular rate & rhythm and no murmurs and no lower extremity edema ABDOMEN:abdomen soft, non-tender and normal bowel sounds Musculoskeletal:no cyanosis of digits and no clubbing  NEURO: alert & oriented x 3 with fluent speech, no focal motor/sensory deficits  Physical Exam    LABORATORY DATA:  I have reviewed the data as listed    Latest Ref Rng & Units 07/15/2024   12:44 PM 06/17/2024    7:24 AM 05/25/2024    3:26 PM  CBC  WBC 4.0 - 10.5 K/uL 141.9  94.5  84.3   Hemoglobin 13.0 - 17.0 g/dL 85.4  86.6  85.3   Hematocrit 39.0 - 52.0 % 45.9  40.3  43.9   Platelets 150 - 400 K/uL 213  165  168         Latest Ref Rng & Units 07/15/2024   12:44 PM 06/17/2024    7:24 AM 06/10/2024   12:00 AM  CMP  Glucose 70 - 99 mg/dL 830  742    BUN 8 - 23 mg/dL 20  14  12    Creatinine 0.61 - 1.24 mg/dL 8.73  8.83  1.1   Sodium 135 - 145 mmol/L 140  140  138   Potassium 3.5 - 5.1 mmol/L 4.0  3.9  3.8   Chloride 98 - 111 mmol/L 104  105  104   CO2 22 - 32 mmol/L 28  26  26    Calcium  8.9 - 10.3 mg/dL 9.0  8.6  9.3   Total Protein 6.5 - 8.1 g/dL 6.4  6.1    Total Bilirubin 0.0 - 1.2 mg/dL 1.4  1.4    Alkaline Phos 38 - 126 U/L 52  50  58   AST 15 - 41 U/L 17  18  19    ALT 0 - 44  U/L 15  16  18        RADIOGRAPHIC STUDIES: I have personally reviewed the radiological images as listed and agreed with the findings in the report. No results found.    No orders of the defined types were placed in this encounter.  All questions were answered. The patient knows to call the clinic with any problems, questions or concerns. No barriers to learning was detected. The total time spent in the appointment was 20 minutes, including review of chart and various tests results, discussions about  plan of care and coordination of care plan     Onita Mattock, MD 07/15/2024

## 2024-07-17 ENCOUNTER — Encounter: Payer: Self-pay | Admitting: Hematology

## 2024-07-17 ENCOUNTER — Other Ambulatory Visit (HOSPITAL_COMMUNITY): Payer: Self-pay

## 2024-07-20 ENCOUNTER — Other Ambulatory Visit: Payer: Self-pay | Admitting: Hematology

## 2024-07-20 ENCOUNTER — Other Ambulatory Visit: Payer: Self-pay

## 2024-07-20 DIAGNOSIS — C9110 Chronic lymphocytic leukemia of B-cell type not having achieved remission: Secondary | ICD-10-CM

## 2024-07-20 MED ORDER — CALQUENCE 100 MG PO TABS
100.0000 mg | ORAL_TABLET | Freq: Two times a day (BID) | ORAL | 2 refills | Status: DC
  Filled 2024-07-20 – 2024-08-04 (×3): qty 60, 30d supply, fill #0
  Filled 2024-08-30: qty 60, 30d supply, fill #1

## 2024-07-22 ENCOUNTER — Other Ambulatory Visit (HOSPITAL_COMMUNITY): Payer: Self-pay

## 2024-08-03 DIAGNOSIS — Z23 Encounter for immunization: Secondary | ICD-10-CM | POA: Diagnosis not present

## 2024-08-04 ENCOUNTER — Other Ambulatory Visit: Payer: Self-pay | Admitting: Pharmacy Technician

## 2024-08-04 ENCOUNTER — Other Ambulatory Visit: Payer: Self-pay

## 2024-08-04 ENCOUNTER — Other Ambulatory Visit (HOSPITAL_COMMUNITY): Payer: Self-pay

## 2024-08-04 NOTE — Progress Notes (Signed)
 Specialty Pharmacy Refill Coordination Note  [name] is a 83 y.o. male contacted today regarding refills of specialty medication(s) Acalabrutinib  Maleate (Calquence )   Patient requested Delivery   Delivery date: 08/09/24   Verified address: [address]  [place] [id number]   Medication will be filled on 08/06/24.  Spoke to patient's spouse.

## 2024-08-06 ENCOUNTER — Other Ambulatory Visit: Payer: Self-pay

## 2024-08-07 ENCOUNTER — Encounter: Payer: Self-pay | Admitting: Family Medicine

## 2024-08-09 DIAGNOSIS — Z789 Other specified health status: Secondary | ICD-10-CM | POA: Diagnosis not present

## 2024-08-09 DIAGNOSIS — Z85828 Personal history of other malignant neoplasm of skin: Secondary | ICD-10-CM | POA: Diagnosis not present

## 2024-08-09 DIAGNOSIS — D0461 Carcinoma in situ of skin of right upper limb, including shoulder: Secondary | ICD-10-CM | POA: Diagnosis not present

## 2024-08-09 DIAGNOSIS — L821 Other seborrheic keratosis: Secondary | ICD-10-CM | POA: Diagnosis not present

## 2024-08-09 DIAGNOSIS — L538 Other specified erythematous conditions: Secondary | ICD-10-CM | POA: Diagnosis not present

## 2024-08-09 DIAGNOSIS — L814 Other melanin hyperpigmentation: Secondary | ICD-10-CM | POA: Diagnosis not present

## 2024-08-09 DIAGNOSIS — L2989 Other pruritus: Secondary | ICD-10-CM | POA: Diagnosis not present

## 2024-08-09 DIAGNOSIS — D485 Neoplasm of uncertain behavior of skin: Secondary | ICD-10-CM | POA: Diagnosis not present

## 2024-08-09 DIAGNOSIS — D225 Melanocytic nevi of trunk: Secondary | ICD-10-CM | POA: Diagnosis not present

## 2024-08-09 DIAGNOSIS — L820 Inflamed seborrheic keratosis: Secondary | ICD-10-CM | POA: Diagnosis not present

## 2024-08-09 DIAGNOSIS — Z08 Encounter for follow-up examination after completed treatment for malignant neoplasm: Secondary | ICD-10-CM | POA: Diagnosis not present

## 2024-08-12 ENCOUNTER — Ambulatory Visit (INDEPENDENT_AMBULATORY_CARE_PROVIDER_SITE_OTHER)

## 2024-08-12 DIAGNOSIS — E538 Deficiency of other specified B group vitamins: Secondary | ICD-10-CM

## 2024-08-12 MED ORDER — CYANOCOBALAMIN 1000 MCG/ML IJ SOLN
1000.0000 ug | Freq: Once | INTRAMUSCULAR | Status: AC
  Administered 2024-08-12: 1000 ug via INTRAMUSCULAR

## 2024-08-12 NOTE — Progress Notes (Signed)
.  Patient is in office today for a nurse visit for B12 Injection. Patient Injection was given in the  Right deltoid. Patient tolerated injection well. Per Dr. Katrinka. Pt was advised to schedule next injection. Pt understood.

## 2024-08-26 ENCOUNTER — Other Ambulatory Visit: Payer: Self-pay

## 2024-08-30 ENCOUNTER — Other Ambulatory Visit: Payer: Self-pay

## 2024-08-30 NOTE — Progress Notes (Signed)
 Specialty Pharmacy Refill Coordination Note  [name] is a 83 y.o. male contacted today regarding refills of specialty medication(s) Acalabrutinib  Maleate (Calquence )   Patient requested Delivery   Delivery date: 09/01/24   Verified address: [address]  [place] [address]   Medication will be filled on 08/31/24.

## 2024-09-03 ENCOUNTER — Encounter (INDEPENDENT_AMBULATORY_CARE_PROVIDER_SITE_OTHER): Admitting: Ophthalmology

## 2024-09-03 DIAGNOSIS — I10 Essential (primary) hypertension: Secondary | ICD-10-CM

## 2024-09-03 DIAGNOSIS — H43813 Vitreous degeneration, bilateral: Secondary | ICD-10-CM | POA: Diagnosis not present

## 2024-09-03 DIAGNOSIS — H35033 Hypertensive retinopathy, bilateral: Secondary | ICD-10-CM | POA: Diagnosis not present

## 2024-09-03 DIAGNOSIS — H353231 Exudative age-related macular degeneration, bilateral, with active choroidal neovascularization: Secondary | ICD-10-CM | POA: Diagnosis not present

## 2024-09-03 LAB — OPHTHALMOLOGY REPORT-SCANNED

## 2024-09-08 DIAGNOSIS — D0461 Carcinoma in situ of skin of right upper limb, including shoulder: Secondary | ICD-10-CM | POA: Diagnosis not present

## 2024-09-09 ENCOUNTER — Ambulatory Visit

## 2024-09-09 DIAGNOSIS — E538 Deficiency of other specified B group vitamins: Secondary | ICD-10-CM | POA: Diagnosis not present

## 2024-09-09 MED ORDER — CYANOCOBALAMIN 1000 MCG/ML IJ SOLN
1000.0000 ug | Freq: Once | INTRAMUSCULAR | Status: AC
  Administered 2024-09-09: 1000 ug via INTRAMUSCULAR

## 2024-09-09 NOTE — Progress Notes (Signed)
 Patient is in office today for a nurse visit for B12 Injection, per PCP's order. Patient Injection was given in the  Left deltoid. Patient tolerated injection well.

## 2024-09-16 ENCOUNTER — Inpatient Hospital Stay: Payer: Medicare Other | Admitting: Hematology

## 2024-09-16 ENCOUNTER — Other Ambulatory Visit: Payer: Self-pay

## 2024-09-16 ENCOUNTER — Inpatient Hospital Stay: Payer: Medicare Other | Attending: Hematology

## 2024-09-16 VITALS — BP 98/60 | HR 98 | Temp 98.0°F | Resp 17 | Ht 72.0 in | Wt 244.9 lb

## 2024-09-16 DIAGNOSIS — R233 Spontaneous ecchymoses: Secondary | ICD-10-CM | POA: Insufficient documentation

## 2024-09-16 DIAGNOSIS — C4A61 Merkel cell carcinoma of right upper limb, including shoulder: Secondary | ICD-10-CM

## 2024-09-16 DIAGNOSIS — C9110 Chronic lymphocytic leukemia of B-cell type not having achieved remission: Secondary | ICD-10-CM | POA: Insufficient documentation

## 2024-09-16 DIAGNOSIS — Z85828 Personal history of other malignant neoplasm of skin: Secondary | ICD-10-CM | POA: Insufficient documentation

## 2024-09-16 DIAGNOSIS — G894 Chronic pain syndrome: Secondary | ICD-10-CM | POA: Diagnosis not present

## 2024-09-16 DIAGNOSIS — M549 Dorsalgia, unspecified: Secondary | ICD-10-CM | POA: Diagnosis not present

## 2024-09-16 DIAGNOSIS — H6993 Unspecified Eustachian tube disorder, bilateral: Secondary | ICD-10-CM | POA: Insufficient documentation

## 2024-09-16 LAB — CBC WITH DIFFERENTIAL (CANCER CENTER ONLY)
Abs Immature Granulocytes: 0.12 K/uL — ABNORMAL HIGH (ref 0.00–0.07)
Basophils Absolute: 0.1 K/uL (ref 0.0–0.1)
Basophils Relative: 0 %
Eosinophils Absolute: 0.4 K/uL (ref 0.0–0.5)
Eosinophils Relative: 1 %
HCT: 45.4 % (ref 39.0–52.0)
Hemoglobin: 14.9 g/dL (ref 13.0–17.0)
Immature Granulocytes: 0 %
Lymphocytes Relative: 75 %
Lymphs Abs: 31.1 K/uL — ABNORMAL HIGH (ref 0.7–4.0)
MCH: 28.3 pg (ref 26.0–34.0)
MCHC: 32.8 g/dL (ref 30.0–36.0)
MCV: 86.3 fL (ref 80.0–100.0)
Monocytes Absolute: 1.5 K/uL — ABNORMAL HIGH (ref 0.1–1.0)
Monocytes Relative: 4 %
Neutro Abs: 8.2 K/uL — ABNORMAL HIGH (ref 1.7–7.7)
Neutrophils Relative %: 20 %
Platelet Count: 223 K/uL (ref 150–400)
RBC: 5.26 MIL/uL (ref 4.22–5.81)
RDW: 14.6 % (ref 11.5–15.5)
Smear Review: NORMAL
WBC Count: 41.4 K/uL — ABNORMAL HIGH (ref 4.0–10.5)
nRBC: 0 % (ref 0.0–0.2)

## 2024-09-16 LAB — CMP (CANCER CENTER ONLY)
ALT: 5 U/L (ref 0–44)
AST: 11 U/L — ABNORMAL LOW (ref 15–41)
Albumin: 4.2 g/dL (ref 3.5–5.0)
Alkaline Phosphatase: 54 U/L (ref 38–126)
Anion gap: 6 (ref 5–15)
BUN: 16 mg/dL (ref 8–23)
CO2: 29 mmol/L (ref 22–32)
Calcium: 9.8 mg/dL (ref 8.9–10.3)
Chloride: 105 mmol/L (ref 98–111)
Creatinine: 0.92 mg/dL (ref 0.61–1.24)
GFR, Estimated: >60 mL/min (ref >60)
Glucose, Bld: 123 mg/dL — ABNORMAL HIGH (ref 70–99)
Potassium: 4.1 mmol/L (ref 3.5–5.1)
Sodium: 140 mmol/L (ref 135–145)
Total Bilirubin: 1.3 mg/dL — ABNORMAL HIGH (ref 0.0–1.2)
Total Protein: 6.4 g/dL — ABNORMAL LOW (ref 6.5–8.1)

## 2024-09-16 MED ORDER — CALQUENCE 100 MG PO TABS
100.0000 mg | ORAL_TABLET | Freq: Two times a day (BID) | ORAL | 2 refills | Status: DC
  Filled 2024-09-16 – 2024-09-29 (×3): qty 60, 30d supply, fill #0
  Filled 2024-10-27: qty 60, 30d supply, fill #1
  Filled 2024-11-30 – 2024-12-01 (×2): qty 60, 30d supply, fill #2

## 2024-09-16 NOTE — Assessment & Plan Note (Signed)
-  Diagnosed in 2015 after presenting with lymphocytosis and adenopathy -Stage 0, on active surveillance, has not required any treatment. 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 Results for CCND1/IGH, ATM, chromosome 12, and TP53 were normal.  -CT 04/09/2024 showed diffuse adenopathy in neck, abdomen and pelvis, up to 2 cm, new splenectomy and persistent hepatomegaly. - He started acalabrutinib  in late July 2025

## 2024-09-16 NOTE — Progress Notes (Signed)
 [facility]   [phone]) [phone] Fax:(336) [phone]   Clinic Follow up Note   Patient Care Team: Katrinka Garnette KIDD, MD as PCP - General (Family Medicine) Alvia Norleen BIRCH, MD as Consulting Physician (Ophthalmology) Livingston Rigg, MD as Consulting Physician (Dermatology) Nicholaus Sherlean CROME, [facility] (Inactive) (Pharmacist) Lanny Callander, MD as Consulting Physician (Hematology and Oncology) Kate Lonni CROME, MD as Consulting Physician (Cardiology)  Date of Service:  09/16/2024  CHIEF COMPLAINT: f/u of CLL  CURRENT THERAPY:  Acalabrutinib   Oncology History   Chronic lymphocytic leukemia (HCC) -Diagnosed in 2015 after presenting with lymphocytosis and adenopathy -Stage 0, on active surveillance, has not required any treatment. 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 Results for CCND1/IGH, ATM, chromosome 12, and TP53 were normal.  -CT 04/09/2024 showed diffuse adenopathy in neck, abdomen and pelvis, up to 2 cm, new splenectomy and persistent hepatomegaly. - He started acalabrutinib  in late July 2025  Assessment & Plan Chronic lymphocytic leukemia, B-cell type, not in remission CLL is showing improvement with current treatment. Blood count has decreased from 141 to 41, indicating a positive response to therapy. No anemia or other hematological issues. Reports improvement in symptoms, including reduced night sweats. - Continue current CLL treatment regimen - Schedule follow-up appointment in three months  Bruising and skin bleeding, likely medication-related Bruising and skin bleeding likely related to aspirin and acasiclovir use. Aspirin was initially prescribed for atrial fibrillation, which is no longer present. The combination increases bleeding risk. - Discontinue aspirin to reduce bleeding risk - Monitor  for improvement in bruising - Educate on signs of atrial fibrillation and how to monitor heart rhythm using pulse oximeter - Check pulse three times a week for irregularities  Recent skin cancer, status post excision Skin cancer excised one week ago. Surgical site is healing well with no current pain. Previous infection treated with antibiotics.  Chronic back pain Chronic back pain managed with tramadol .  - Continue tramadol  for pain management - Monitor for side effects, including constipation  Plan - Lab reviewed, WBC has dropped from previous 141.9 K to 41.4 K today, he is responding well to a acalabrutinib , will continue. I refilled for him.  - Due to excessive ecchymosis, and no strong indication for aspirin, I recommend him to stop baby aspirin. - Lab and follow-up in 3 months.    Discussed the use of AI scribe software for clinical note transcription with the patient, who gave verbal consent to proceed.  History of Present Illness [name] is an 83 year old male with chronic lymphocytic leukemia who presents for follow-up.  He started treatment for chronic lymphocytic leukemia in July and is on his third bottle of medication. His blood count has decreased from 141 to 41, with normal being up to 10. Initially, he noticed a difference after starting the medication, but now feels his body has stabilized.  He underwent surgery for skin cancer removal a week ago, marking his second occurrence. Post-surgery, he developed an infection and was prescribed antibiotics for seven days. There is no current pain from the surgery site.  He experiences bruising and skin color changes, which he associates with medication side effects. He is taking aspirin and has atrial fibrillation diagnosed approximately a year and a half to two years ago, with no episodes since then. No other bleeding issues such  as epistaxis or gum bleeding are present.  He experiences fluctuations between  diarrhea and constipation, attributed to tramadol  use for back pain. He receives B12 injections and sees another physician for Parkinson's disease management. He also has sinus problems and plans to consult another physician for this issue.     All other systems were reviewed with the patient and are negative.  MEDICAL HISTORY:  Past Medical History:  Diagnosis Date   Arthritis    Basal cell carcinoma 03/18/2017   left neck CX3 5FU   Bell's palsy 05/08/2020   Cataract    Chronic bilateral low back pain without sciatica 12/12/2015   Chronic lymphocytic leukemia 05/16/2014   Oncology q6 months. Thought to be agent orange related-CLL and prostate cancer  Diagnosis 1. 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 - NO EVIDENCE OF EXTRAPROSTATIC EXTENSION, ANGIOLYMPHATIC INVASION OR SEMINAL VESICLE INVOLVEMENT IDENTIFIED. - RESECTION MARGINS, NEGATIVE FOR ATYPIA OR MALIGNANCY. 2. Lymph nodes, regional resection, righ   Chronic pain syndrome 02/01/2016   Chronic right hip pain 09/27/2019   DDD (degenerative disc disease), lumbar    Diabetic retinopathy 09/24/2007   Diverticulosis of colon    Elevated PSA    Erectile dysfunction 12/30/2007   No rx.      GERD (gastroesophageal reflux disease)    Gynecomastia 12/28/2020   History of colonic polyps 08/22/2008   2004 3 adenomas 2009 none 2013 none 02/26/2017 8 mm sigmoid polyp was inflammatory - no recall planned given age/co-morbidities overall hx of polyps     History of prostate cancer 01/24/2014   Follows with Dr. Brunetta every 3 months. Dr. Amadeo every 6 months.  Diagnosis 1. 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 - NO EVIDENCE OF EXTRAPROSTATIC EXTENSION, ANGIOLYMPHATIC INVASION OR SEMINAL VESICLE INVOLVEMENT IDENTIFIED. - RESECTION MARGINS, NEGATIVE FOR ATYPIA OR MALIGNANCY. 2. Lymph nodes, regional resection, right pelvic - SIX    Hyperlipidemia associated with type 2 diabetes mellitus 09/24/2007   High triglycerides. Refused statin due to myalgias in past but then started half tablet of simvastatin  40mg  through [facility] of this note might be different from the original. Formatting of this note might be different from the original. High triglycerides. Refused statin due to myalgias in past but then started half tablet of simvastatin  40mg  through [place]  Last Assessment & Plan:  Formatting    Hypertension associated with diabetes 09/24/2007   Amlodipine  10mg , losartan  100mg , HCTZ 25mg , propranolol  40 mg twice a day Home cuff 142/85 compared to 134/72 on my check. Likely home cuff about 10 points higher.   --> chlorthalidone  03/20/16 but patient didn't change and then BP controlled on next visit   Formatting of this note might be different from the original. Formatting of this note might be different from the original. 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  Follow up in 6 months unless wound issues arise.  Derm follow up in October 2022 scheduled.  I will see him back in 6 months.   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 a1c under 8     Formatting of this note might be different from the original. Formatting of this note might be different from the original. And macular degeneration.   Last Assessment & Plan:  Formatting of this note might be different from the original. S: diabetic retinopathy followed up by optho yesterday and largely stable. He also has m   Vitamin D  deficiency 11/01/2020   At [facility]- on 2000 units a day at least since 2021     SURGICAL HISTORY: Past Surgical History:  Procedure Laterality Date   CHOLECYSTECTOMY     COLONOSCOPY  03/25/12, 09/28/08   ESOPHAGOGASTRODUODENOSCOPY N/A 10/12/2014   Procedure: ESOPHAGOGASTRODUODENOSCOPY (EGD);  Surgeon: Lupita FORBES Commander, MD;  Location: [facility];  Service: Endoscopy;  Laterality: N/A;   ESOPHAGOGASTRODUODENOSCOPY ENDOSCOPY  09/28/08   EXCISION MELANOMA WITH SENTINEL LYMPH NODE BIOPSY Right 05/31/2021   Procedure: WIDE LOCAL EXCISION RIGHT FOREARM WITH ADVANCEDMENT FLAP CLOSURE WITH SENTINEL LYMPH NODE MAPPING AND BIOSPY;  Surgeon: Aron Shoulders, MD;  Location: [facility];  Service: General;  Laterality: Right;   FOOT SURGERY Bilateral    HEMORRHOID SURGERY     LAPAROSCOPIC GASTRIC BANDING  03/05/11   weight loss   LAPAROSCOPIC GASTRIC BANDING WITH HIATAL HERNIA REPAIR  03/05/2011   LYMPHADENECTOMY Bilateral 01/24/2014   Procedure: LYMPHADENECTOMY;  Surgeon: Noretta Ferrara, MD;  Location: [facility];  Service: Urology;  Laterality: Bilateral;   ORIF TIBIA FRACTURE Right    PENILE PROSTHESIS IMPLANT     PROSTATE SURGERY  01/2014   ROBOT ASSISTED LAPAROSCOPIC RADICAL PROSTATECTOMY N/A 01/24/2014   Procedure: ROBOTIC ASSISTED LAPAROSCOPIC RADICAL PROSTATECTOMY LEVEL 3;  Surgeon: Noretta Ferrara, MD;  Location: [facility];  Service: Urology;  Laterality: N/A;   SHOULDER SURGERY Right 2011   TONSILLECTOMY     age 35   VASECTOMY      I have reviewed the social history and family history with the patient and they are unchanged from previous  note.  ALLERGIES:  is allergic to morphine sulfate.  MEDICATIONS:  Current Outpatient Medications  Medication Sig Dispense Refill   doxycycline  (VIBRAMYCIN ) 100 MG capsule Take 100 mg by mouth 2 (two) times daily.     acalabrutinib  maleate (CALQUENCE ) 100 MG tablet Take 1 tablet (100 mg total) by mouth 2 (two) times daily. 60 tablet 2   amLODipine  (NORVASC ) 10 MG tablet Take 10 mg by mouth daily.     aspirin EC 81 MG tablet Take 81 mg by mouth daily. Swallow whole.     busPIRone  (BUSPAR ) 10 MG tablet Take by mouth 3 (three) times daily.     carbidopa -levodopa  (SINEMET  IR) 25-100 MG tablet Take 1 tablet by mouth 3 (three) times daily. 270 tablet 1   Cholecalciferol (VITAMIN D3) 50 MCG (2000 UT) TABS Take 1 tablet by mouth daily.     empagliflozin  (JARDIANCE ) 25 MG TABS tablet Take 1 tablet (25 mg total) by mouth daily before breakfast. (GIVEN BY [facility]) 30 tablet    escitalopram (LEXAPRO) 10 MG tablet Take 10  mg by mouth daily.     gabapentin  (NEURONTIN ) 300 MG capsule Take 600 mg by mouth 3 (three) times daily.     glucose blood test strip Use to test 4 times daily. E11.9 100 each 12   hydrochlorothiazide (HYDRODIURIL) 25 MG tablet Take 25 mg by mouth daily.     insulin  aspart (NOVOLOG ) 100 UNIT/ML injection Inject 35 Units into the skin 3 (three) times daily with meals. When sugar is elevated     insulin  glargine (LANTUS ) 100 UNIT/ML injection Inject 50 Units into the skin at bedtime.     levothyroxine  (SYNTHROID , LEVOTHROID) 50 MCG tablet Take 50 mcg by mouth daily before breakfast.     losartan  (COZAAR ) 100 MG tablet Take 100 mg by mouth daily.     mirtazapine  (REMERON ) 15 MG tablet Take 15 mg by mouth at bedtime.     neomycin -polymyxin b-dexamethasone  (MAXITROL ) 3.5-10000-0.1 SUSP Place 1 drop into both eyes 3 (three) times daily.     OVER THE COUNTER MEDICATION PreserVision for eyes     potassium chloride  SA (K-DUR,KLOR-CON ) 20 MEQ tablet Take 20 mEq by mouth daily.     propranolol   (INDERAL ) 40 MG tablet Take 40 mg by mouth 3 (three) times daily.     simvastatin  (ZOCOR ) 80 MG tablet Take 40 mg by mouth daily at 6 PM. Take half tablet (40 mg) by mouth once daily     sucralfate  (CARAFATE ) 1 G tablet Take 1 g by mouth 4 (four) times daily -  with meals and at bedtime.     terbinafine  (LAMISIL ) 1 % cream Apply 1 Application topically 2 (two) times daily.     traMADol  (ULTRAM ) 50 MG tablet Take 1 tablet (50 mg total) by mouth every 6 (six) hours as needed for moderate pain (pain score 4-6). 30 tablet 5   zolpidem  (AMBIEN ) 5 MG tablet Take 1 tablet (5 mg total) by mouth at bedtime as needed for sleep. 30 tablet 5   No current facility-administered medications for this visit.    PHYSICAL EXAMINATION: ECOG PERFORMANCE STATUS: 2 - Symptomatic, <50% confined to bed  Vitals:   09/16/24 1027  BP: 98/60  Pulse: 98  Resp: 17  Temp: 98 F (36.7 C)  SpO2: 97%   Wt Readings from Last 3 Encounters:  09/16/24 244 lb 14.4 oz (111.1 kg)  07/15/24 241 lb 14.4 oz (109.7 kg)  06/24/24 252 lb 6.4 oz (114.5 kg)     GENERAL:alert, no distress and comfortable SKIN: skin color, texture, turgor are normal, extensive ecchymosis on bilateral forearms, with mild right forearm edema, reason the skin surgery in right forearm with a large bandage. EYES: normal, Conjunctiva are pink and non-injected, sclera clear NECK: supple, thyroid  normal size, non-tender, without nodularity LYMPH:  no palpable lymphadenopathy in the cervical, axillary  LUNGS: clear to auscultation and percussion with normal breathing effort HEART: regular rate & rhythm and no murmurs and no lower extremity edema ABDOMEN:abdomen soft, non-tender and normal bowel sounds   Physical Exam    LABORATORY DATA:  I have reviewed the data as listed    Latest Ref Rng & Units 09/16/2024   10:08 AM 07/15/2024   12:44 PM 06/17/2024    7:24 AM  CBC  WBC 4.0 - 10.5 K/uL 41.4  141.9  94.5   Hemoglobin 13.0 - 17.0 g/dL 85.0   85.4  86.6   Hematocrit 39.0 - 52.0 % 45.4  45.9  40.3   Platelets 150 - 400 K/uL 223  213  165         Latest Ref Rng & Units 09/16/2024   10:08 AM 07/15/2024   12:44 PM 06/17/2024    7:24 AM  CMP  Glucose 70 - 99 mg/dL 876  830  742   BUN 8 - 23 mg/dL 16  20  14    Creatinine 0.61 - 1.24 mg/dL 9.07  8.73  8.83   Sodium 135 - 145 mmol/L 140  140  140   Potassium 3.5 - 5.1 mmol/L 4.1  4.0  3.9   Chloride 98 - 111 mmol/L 105  104  105   CO2 22 - 32 mmol/L 29  28  26    Calcium  8.9 - 10.3 mg/dL 9.8  9.0  8.6   Total Protein 6.5 - 8.1 g/dL 6.4  6.4  6.1   Total Bilirubin 0.0 - 1.2 mg/dL 1.3  1.4  1.4   Alkaline Phos 38 - 126 U/L 54  52  50   AST 15 - 41 U/L 11  17  18    ALT 0 - 44 U/L 5  15  16        RADIOGRAPHIC STUDIES: I have personally reviewed the radiological images as listed and agreed with the findings in the report. No results found.    No orders of the defined types were placed in this encounter.  All questions were answered. The patient knows to call the clinic with any problems, questions or concerns. No barriers to learning was detected. The total time spent in the appointment was 25 minutes, including review of chart and various tests results, discussions about plan of care and coordination of care plan     Onita Mattock, MD 09/16/2024

## 2024-09-27 ENCOUNTER — Encounter (INDEPENDENT_AMBULATORY_CARE_PROVIDER_SITE_OTHER): Admitting: Ophthalmology

## 2024-09-27 ENCOUNTER — Other Ambulatory Visit (HOSPITAL_COMMUNITY): Payer: Self-pay

## 2024-09-27 DIAGNOSIS — H26491 Other secondary cataract, right eye: Secondary | ICD-10-CM | POA: Diagnosis not present

## 2024-09-29 ENCOUNTER — Other Ambulatory Visit: Payer: Self-pay

## 2024-09-29 NOTE — Progress Notes (Signed)
 Specialty Pharmacy Refill Coordination Note  [name] is a 83 y.o. male contacted today regarding refills of specialty medication(s) Acalabrutinib  Maleate (Calquence )   Patient requested Delivery   Delivery date: 10/04/24   Verified address: [address]  [place] [address]   Medication will be filled on: 10/01/24

## 2024-10-06 ENCOUNTER — Other Ambulatory Visit: Payer: Self-pay

## 2024-10-06 NOTE — Progress Notes (Signed)
 Specialty Pharmacy Ongoing Clinical Assessment Note  [name] is a 83 y.o. male who is being followed by the specialty pharmacy service for RxSp Oncology   Patient's specialty medication(s) reviewed today: Acalabrutinib  Maleate (Calquence )   Missed doses in the last 4 weeks: 0   Patient/Caregiver did not have any additional questions or concerns.   Therapeutic benefit summary: Patient is achieving benefit   Adverse events/side effects summary: No adverse events/side effects   Patient's therapy is appropriate to: Continue    Goals Addressed             This Visit's Progress    Slow Disease Progression       Patient is on track. Patient will maintain adherence. Per Dr. Demetra note from 09/16/24, patient has shown laboratory and clinical improvement and current regimen is appropriate to continue.          Follow up: 3 months  Lorrinda Ramstad M Ladan Vanderzanden Specialty Pharmacist

## 2024-10-07 ENCOUNTER — Ambulatory Visit (INDEPENDENT_AMBULATORY_CARE_PROVIDER_SITE_OTHER)

## 2024-10-07 DIAGNOSIS — E538 Deficiency of other specified B group vitamins: Secondary | ICD-10-CM

## 2024-10-07 MED ORDER — CYANOCOBALAMIN 1000 MCG/ML IJ SOLN
1000.0000 ug | Freq: Once | INTRAMUSCULAR | Status: AC
  Administered 2024-10-07: 1000 ug via INTRAMUSCULAR

## 2024-10-07 NOTE — Addendum Note (Signed)
 Addended by: FRANCIS ROULEAU A on: 10/07/2024 11:04 AM   Modules accepted: Orders

## 2024-10-07 NOTE — Progress Notes (Signed)
 Patient is in office today for a nurse visit for B12 Injection. Patient Injection was given in the  Left deltoid. Patient tolerated injection well. No other questions or concerns were addressed during this visit.

## 2024-10-19 ENCOUNTER — Other Ambulatory Visit: Payer: Self-pay

## 2024-10-27 ENCOUNTER — Encounter (INDEPENDENT_AMBULATORY_CARE_PROVIDER_SITE_OTHER): Payer: Self-pay

## 2024-10-27 ENCOUNTER — Other Ambulatory Visit (HOSPITAL_COMMUNITY): Payer: Self-pay

## 2024-10-29 ENCOUNTER — Other Ambulatory Visit (HOSPITAL_COMMUNITY): Payer: Self-pay

## 2024-10-29 ENCOUNTER — Other Ambulatory Visit: Payer: Self-pay

## 2024-10-29 NOTE — Progress Notes (Signed)
 Specialty Pharmacy Refill Coordination Note  [name] is a 83 y.o. male contacted today regarding refills of specialty medication(s) Acalabrutinib  Maleate (Calquence )   Patient requested Delivery   Delivery date: 11/09/24   Verified address: [address]    [place], [place]  [address]   Medication will be filled on: 11/08/24

## 2024-11-08 ENCOUNTER — Other Ambulatory Visit (HOSPITAL_COMMUNITY): Payer: Self-pay

## 2024-11-08 ENCOUNTER — Other Ambulatory Visit: Payer: Self-pay

## 2024-11-10 NOTE — Progress Notes (Signed)
 Assessment/Plan:   1.  Parkinsons Disease with levodopa  resistant tremor  -Patient is status post focused ultrasound to the left VIM.  He had that done at [facility] on December 18, 2022.  - Patient's balance did get worse somewhat post focused ultrasound, and that is fairly common postprocedure and that was discussed with him preprocedure.  This was actually the reason that [facility] did not want to go in and do the second side.  However, deconditioning and lack of exercise also plays a role here.    - Prescription for lift chair was given today.  -pt with R facial droop since his focused ultrasound and I told him that the R eye isn't blinking well.  He needs to use extra lubrication.  He is noting that he drools when drinking liquid and discussed that this is a bit different from Parkinsons Disease drooling but instead from weakness of the muscles around the mouth  - Patient notes some dysarthria ever since his focused ultrasound, but I really think he is doing pretty good in that regard.  I will  - Patient does have a bit of a resistant tremor, but has gone back on carbidopa /levodopa  25/100, 1 tablet 3 times per day for the treatment of Parkinson's disease.  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  -He is following with [facility] dermatology.   2.  Anxiety  -Following with psychiatry at [facility].  -On [facility]apine  [facility], 7.5 mg at bed  - On BuSpar   - On escitalopram through [facility].  Serotonin syndrome possibilities have been discussed, but all treatment has been through [place]  3.  Hypertension  -On several antihypertensives including hydrochlorothiazide, losartan , amlodipine .  We will need to watch this in the future given the nature of Parkinson's disease for causing dysautonomia.  Blood pressure was good in the office and wife is going to monitor it at home.  I told her she does not need to take this daily, but perhaps a few times per week.  4.   Diabetes, type II, insulin -dependent  -Managed by Dr. Katrinka.  5.  CLL, diagnosed 2015  -Patient is following with oncology.  - Patient started chemotherapy late July, 2025  6.  New onset A-fib/a flutter in September, 2024  -Currently not on anticoagulation.    - Zio patch in October, 2024 without evidence of A-fib, but he did have 153 episodes of SVT and frequent PACs.  7.  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  He has declined.  He was asked to discontinue his aspirin by oncology  8.  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  Wife states that it has been that way ever since his removal of the basal cell carcinoma 3 weeks ago.  She does state that the surgeon has not yet seen him.  I asked them to call the surgeon and follow-up immediately, and she actually called them when they were in the office today.  They sent photos and we measured his arm in the office today and were going to follow-up immediately.  He knows to go to the emergency room if he should become short of breath. Subjective:   [name] was  seen today in follow up.  Patient has had no falls.  No near syncope.  He continues to complain about some tremor.  He is on levodopa .  He has started treatment back for CLL.  Notes are reviewed from Dr. Lanny.  She notes positive response to therapy.  Current prescribed movement disorder medications: carbidopa /levodopa  25/100 tid Mirtazapine , 7.5 mg nightly (by [facility])   ALLERGIES:   Allergies  Allergen Reactions   Morphine Sulfate Itching and Rash    CURRENT MEDICATIONS:  Current Meds  Medication Sig   acalabrutinib  maleate (CALQUENCE ) 100 MG tablet Take 1 tablet (100 mg total) by mouth 2 (two) times daily.   amLODipine   (NORVASC ) 10 MG tablet Take 10 mg by mouth daily.   aspirin EC 81 MG tablet Take 81 mg by mouth daily. Swallow whole.   busPIRone  (BUSPAR ) 10 MG tablet Take by mouth 3 (three) times daily.   carbidopa -levodopa  (SINEMET  IR) 25-100 MG tablet Take 1 tablet by mouth 3 (three) times daily.   Cholecalciferol (VITAMIN D3) 50 MCG (2000 UT) TABS Take 1 tablet by mouth daily.   doxycycline  (VIBRAMYCIN ) 100 MG capsule Take 100 mg by mouth 2 (two) times daily.   empagliflozin  (JARDIANCE ) 25 MG TABS tablet Take 1 tablet (25 mg total) by mouth daily before breakfast. (GIVEN BY [facility])   escitalopram (LEXAPRO) 10 MG tablet Take 10 mg by mouth daily.   gabapentin  (NEURONTIN ) 300 MG capsule Take 600 mg by mouth 3 (three) times daily.   glucose blood test strip Use to test 4 times daily. E11.9   hydrochlorothiazide (HYDRODIURIL) 25 MG tablet Take 25 mg by mouth daily.   insulin  aspart (NOVOLOG ) 100 UNIT/ML injection Inject 35 Units into the skin 3 (three) times daily with meals. When sugar is elevated   insulin  glargine (LANTUS ) 100 UNIT/ML injection Inject 50 Units into the skin at bedtime.   levothyroxine  (SYNTHROID , LEVOTHROID) 50 MCG tablet Take 50 mcg by mouth daily before breakfast.   losartan  (COZAAR ) 100 MG tablet Take 100 mg by mouth daily.   mirtazapine  (REMERON ) 15 MG tablet Take 15 mg by mouth at bedtime.   neomycin -polymyxin b-dexamethasone  (MAXITROL ) 3.5-10000-0.1 SUSP Place 1 drop into both eyes 3 (three) times daily.   OVER THE COUNTER MEDICATION PreserVision for eyes   potassium chloride  SA (K-DUR,KLOR-CON ) 20 MEQ tablet Take 20 mEq by mouth daily.   propranolol  (INDERAL ) 40 MG tablet Take 40 mg by mouth 3 (three) times daily.   simvastatin  (ZOCOR ) 80 MG tablet Take 40 mg by mouth daily at 6 PM. Take half tablet (40 mg) by mouth once daily   sucralfate  (CARAFATE ) 1 G tablet Take 1 g by mouth 4 (four) times daily -  with meals and at bedtime.   terbinafine  (LAMISIL ) 1 % cream Apply 1 Application  topically 2 (two) times daily.   traMADol  (ULTRAM ) 50 MG tablet Take 1 tablet (50 mg total) by mouth every 6 (six) hours as needed for moderate pain (pain score 4-6).   zolpidem  (AMBIEN ) 5 MG tablet Take 1 tablet (5 mg total) by mouth at bedtime as needed for sleep.     Objective:   PHYSICAL EXAMINATION:    VITALS:   Vitals:   11/11/24 1433  BP: 132/82  Pulse: 68  SpO2: 98%  Weight: 249 lb (112.9 kg)  Height: 6' (1.829 m)     GEN:  The patient appears stated age and is in NAD. HEENT:  Normocephalic, atraumatic.  The mucous membranes are moist. The superficial  temporal arteries are without ropiness or tenderness. CV:  RRR Lungs:  CTAB Exts:  the RUE is swollen and purplish in hue.  Right forearm measures 320 mm at its greatest and left measures 290 mm.  Right arm has ecchymosis all the way up to the right shoulder (we took off his jacket, regular shirt and left on only his undershirt, but pulled it all the way up to a few better).   Neurological examination:  Orientation: The patient is alert and oriented x3. Cranial nerves: There is mild R facial droop.  He has mild L ptosis.  The speech is fluent and minimally dysarthric. Soft palate rises symmetrically and there is no tongue deviation. Hearing is significantly decreased to conversational tone. Sensation: Sensation is intact to light touch throughout Motor: Strength is 5/5 in the UE/LE  Movement examination: Tone: There is normal tone in the upper and lower extremities Abnormal movements: there is mild left upper extremity rest tremor, stable Coordination:  There is mild decremation with finger taps bilaterally Gait and Station: The patient pushes off to arise.  He is flexed at the waist and is very slow and short stepped today.  He is dragging the R leg.  I have reviewed and interpreted the following labs independently    Chemistry      Component Value Date/Time   NA 140 09/16/2024 1008   NA 138 06/10/2024 0000   NA  142 10/30/2017 0845   K 4.1 09/16/2024 1008   K 3.7 10/30/2017 0845   CL 105 09/16/2024 1008   CO2 29 09/16/2024 1008   CO2 28 10/30/2017 0845   BUN 16 09/16/2024 1008   BUN 12 06/10/2024 0000   BUN 13.3 10/30/2017 0845   CREATININE 0.92 09/16/2024 1008   CREATININE 1.2 10/30/2017 0845   GLU 266 06/10/2024 0000      Component Value Date/Time   CALCIUM  9.8 09/16/2024 1008   CALCIUM  9.5 10/30/2017 0845   ALKPHOS 54 09/16/2024 1008   ALKPHOS 53 10/30/2017 0845   AST 11 (L) 09/16/2024 1008   AST 37 (H) 10/30/2017 0845   ALT 5 09/16/2024 1008   ALT 37 10/30/2017 0845   BILITOT 1.3 (H) 09/16/2024 1008   BILITOT 1.23 (H) 10/30/2017 0845       Lab Results  Component Value Date   WBC 41.4 (H) 09/16/2024   HGB 14.9 09/16/2024   HCT 45.4 09/16/2024   MCV 86.3 09/16/2024   PLT 223 09/16/2024    Lab Results  Component Value Date   TSH 1.40 01/28/2024     Total time spent on today's visit was 45 minutes, including both face-to-face time and nonface-to-face time.  Time included that spent on review of records (prior notes available to me/labs/imaging if pertinent), discussing treatment and goals, answering patient's questions and coordinating care.   Cc:  Katrinka Garnette KIDD, MD

## 2024-11-11 ENCOUNTER — Encounter: Payer: Self-pay | Admitting: Neurology

## 2024-11-11 ENCOUNTER — Ambulatory Visit

## 2024-11-11 ENCOUNTER — Ambulatory Visit: Admitting: Neurology

## 2024-11-11 DIAGNOSIS — M25421 Effusion, right elbow: Secondary | ICD-10-CM | POA: Diagnosis not present

## 2024-11-11 DIAGNOSIS — G20A1 Parkinson's disease without dyskinesia, without mention of fluctuations: Secondary | ICD-10-CM | POA: Diagnosis not present

## 2024-11-11 DIAGNOSIS — E538 Deficiency of other specified B group vitamins: Secondary | ICD-10-CM | POA: Diagnosis not present

## 2024-11-11 DIAGNOSIS — C44612 Basal cell carcinoma of skin of right upper limb, including shoulder: Secondary | ICD-10-CM | POA: Diagnosis not present

## 2024-11-11 MED ORDER — CYANOCOBALAMIN 1000 MCG/ML IJ SOLN
1000.0000 ug | Freq: Once | INTRAMUSCULAR | Status: AC
  Administered 2024-11-11: 1000 ug via INTRAMUSCULAR

## 2024-11-11 NOTE — Patient Instructions (Signed)
 VISIT SUMMARY: Today, you had a follow-up appointment to discuss your Parkinson's disease and other health concerns. You mentioned that your blood counts are responding well to chemotherapy and that you feel well overall. We also discussed your recent fall, your current medications, and the swelling in your arm after your recent surgery.  YOUR PLAN: -PARKINSON'S DISEASE: Parkinson's disease is a disorder of the nervous system that affects movement. You are currently managing it with carbidopa -levodopa , which you should continue taking three times daily. You should also continue taking mirtazapine  for sleep as prescribed.  -POSTOPERATIVE SWELLING OF RIGHT ARM AFTER BASAL CELL CARCINOMA EXCISION: The swelling in your arm after the removal of basal cell carcinoma may be due to a blood clot. Contact your surgeon immediately and/or go to the ER or Urgent Care.  INSTRUCTIONS: Please follow up with the surgical team regarding the swelling in your arm to ensure there is no blood clot. Continue taking your medications as prescribed and consider using a lift chair to help you get up more easily                     Contains text generated by Abridge.                                 Contains text generated by Abridge.

## 2024-11-11 NOTE — Progress Notes (Signed)
 Patient is in office today for a nurse visit for B12 Injection. Patient Injection was given in the  Left deltoid. Patient tolerated injection well.

## 2024-11-12 ENCOUNTER — Encounter (INDEPENDENT_AMBULATORY_CARE_PROVIDER_SITE_OTHER): Admitting: Ophthalmology

## 2024-11-12 DIAGNOSIS — H43813 Vitreous degeneration, bilateral: Secondary | ICD-10-CM

## 2024-11-12 DIAGNOSIS — I10 Essential (primary) hypertension: Secondary | ICD-10-CM | POA: Diagnosis not present

## 2024-11-12 DIAGNOSIS — H35033 Hypertensive retinopathy, bilateral: Secondary | ICD-10-CM | POA: Diagnosis not present

## 2024-11-12 DIAGNOSIS — H353231 Exudative age-related macular degeneration, bilateral, with active choroidal neovascularization: Secondary | ICD-10-CM

## 2024-11-30 ENCOUNTER — Other Ambulatory Visit: Payer: Self-pay

## 2024-11-30 ENCOUNTER — Encounter (HOSPITAL_COMMUNITY): Payer: Self-pay

## 2024-12-01 ENCOUNTER — Other Ambulatory Visit: Payer: Self-pay

## 2024-12-01 ENCOUNTER — Other Ambulatory Visit (HOSPITAL_COMMUNITY): Payer: Self-pay

## 2024-12-01 NOTE — Progress Notes (Signed)
 Specialty Pharmacy Refill Coordination Note  [name] is a 83 y.o. male contacted today regarding refills of specialty medication(s) Acalabrutinib  Maleate (Calquence )   Patient requested Delivery   Delivery date: 12/10/24   Verified address: [address]   [place], [place]  [address]   Medication will be filled on: 12/09/24

## 2024-12-09 ENCOUNTER — Other Ambulatory Visit: Payer: Self-pay

## 2024-12-13 ENCOUNTER — Ambulatory Visit: Payer: Self-pay

## 2024-12-13 NOTE — Telephone Encounter (Signed)
 Please call patient and schedule with any provider within our office within 3 days.

## 2024-12-13 NOTE — Telephone Encounter (Signed)
 FYI Only or Action Required?: Action required by provider: request for appointment, clinical question for provider, and update on patient condition.  Patient was last seen in primary care on 06/24/2024 by Katrinka Garnette KIDD, MD.  Called Nurse Triage reporting Arm Swelling.  Symptoms began several weeks ago.  Interventions attempted: Rest, hydration, or home remedies and Other: attempted contact with surgeon but no call back.  Symptoms are: gradually worsening.  Triage Disposition: Go to ED Now (Notify PCP)  Patient/caregiver understands and will follow disposition?: No, refuses disposition     Copied from CRM [id number]. Topic: Clinical - Red Word Triage >> Dec 13, 2024  9:52 AM Gustabo D wrote: Pt right arm has been swelling for longer than 2 weeks. Reason for Disposition  SEVERE arm swelling (e.g., all of arm looks swollen)  Answer Assessment - Initial Assessment Questions This RN recommended pt be examined in hospital, pt refusing. Advised pt call 911 or get to hospital asap if any new or worsening symptoms. Sending message to PCP office for call back to pt with further recommendations. Alerted [facility] to ED refusal.    Speaking to pt and wife Pt had surgery on arm 1-2 months ago, swelling since then, gotten worse in past week, pt and wife attempted to contact surgeon but no call back.  Symptoms: Swelling to whole arm/hand, puffy Some pain  Denies: SOB Chest pain Injury Coolness/discoloration to arm Redness or pus at incision site  Protocols used: Arm Swelling and Edema-A-AH

## 2024-12-14 ENCOUNTER — Ambulatory Visit: Admitting: Physician Assistant

## 2024-12-14 ENCOUNTER — Encounter: Payer: Self-pay | Admitting: Family Medicine

## 2024-12-14 ENCOUNTER — Ambulatory Visit (HOSPITAL_COMMUNITY)
Admission: RE | Admit: 2024-12-14 | Discharge: 2024-12-14 | Disposition: A | Discharge status: Home / Self Care | Source: Ambulatory Visit | Attending: Physician Assistant | Admitting: Physician Assistant

## 2024-12-14 ENCOUNTER — Ambulatory Visit: Payer: Self-pay | Admitting: Physician Assistant

## 2024-12-14 ENCOUNTER — Encounter: Payer: Self-pay | Admitting: Physician Assistant

## 2024-12-14 VITALS — BP 124/58 | HR 94 | Temp 97.9°F | Ht 72.0 in | Wt 247.8 lb

## 2024-12-14 DIAGNOSIS — C9110 Chronic lymphocytic leukemia of B-cell type not having achieved remission: Secondary | ICD-10-CM | POA: Diagnosis not present

## 2024-12-14 DIAGNOSIS — R7989 Other specified abnormal findings of blood chemistry: Secondary | ICD-10-CM | POA: Insufficient documentation

## 2024-12-14 DIAGNOSIS — F419 Anxiety disorder, unspecified: Secondary | ICD-10-CM | POA: Insufficient documentation

## 2024-12-14 DIAGNOSIS — H6120 Impacted cerumen, unspecified ear: Secondary | ICD-10-CM | POA: Insufficient documentation

## 2024-12-14 DIAGNOSIS — C44612 Basal cell carcinoma of skin of right upper limb, including shoulder: Secondary | ICD-10-CM | POA: Diagnosis not present

## 2024-12-14 DIAGNOSIS — H905 Unspecified sensorineural hearing loss: Secondary | ICD-10-CM | POA: Insufficient documentation

## 2024-12-14 DIAGNOSIS — S91312A Laceration without foreign body, left foot, initial encounter: Secondary | ICD-10-CM | POA: Insufficient documentation

## 2024-12-14 DIAGNOSIS — H353290 Exudative age-related macular degeneration, unspecified eye, stage unspecified: Secondary | ICD-10-CM | POA: Insufficient documentation

## 2024-12-14 DIAGNOSIS — Z710 Person encountering health services to consult on behalf of another person: Secondary | ICD-10-CM | POA: Insufficient documentation

## 2024-12-14 DIAGNOSIS — E538 Deficiency of other specified B group vitamins: Secondary | ICD-10-CM | POA: Diagnosis not present

## 2024-12-14 DIAGNOSIS — K760 Fatty (change of) liver, not elsewhere classified: Secondary | ICD-10-CM | POA: Insufficient documentation

## 2024-12-14 DIAGNOSIS — C61 Malignant neoplasm of prostate: Secondary | ICD-10-CM | POA: Insufficient documentation

## 2024-12-14 DIAGNOSIS — R600 Localized edema: Secondary | ICD-10-CM

## 2024-12-14 DIAGNOSIS — F32A Depression, unspecified: Secondary | ICD-10-CM | POA: Insufficient documentation

## 2024-12-14 DIAGNOSIS — L603 Nail dystrophy: Secondary | ICD-10-CM | POA: Insufficient documentation

## 2024-12-14 DIAGNOSIS — D126 Benign neoplasm of colon, unspecified: Secondary | ICD-10-CM | POA: Insufficient documentation

## 2024-12-14 DIAGNOSIS — Z7729 Contact with and (suspected ) exposure to other hazardous substances: Secondary | ICD-10-CM | POA: Insufficient documentation

## 2024-12-14 DIAGNOSIS — C9510 Chronic leukemia of unspecified cell type not having achieved remission: Secondary | ICD-10-CM | POA: Insufficient documentation

## 2024-12-14 DIAGNOSIS — Z7409 Other reduced mobility: Secondary | ICD-10-CM | POA: Insufficient documentation

## 2024-12-14 DIAGNOSIS — Z7181 Spiritual or religious counseling: Secondary | ICD-10-CM | POA: Insufficient documentation

## 2024-12-14 DIAGNOSIS — E1165 Type 2 diabetes mellitus with hyperglycemia: Secondary | ICD-10-CM | POA: Insufficient documentation

## 2024-12-14 DIAGNOSIS — Z029 Encounter for administrative examinations, unspecified: Secondary | ICD-10-CM | POA: Insufficient documentation

## 2024-12-14 DIAGNOSIS — Z658 Other specified problems related to psychosocial circumstances: Secondary | ICD-10-CM | POA: Insufficient documentation

## 2024-12-14 DIAGNOSIS — E1140 Type 2 diabetes mellitus with diabetic neuropathy, unspecified: Secondary | ICD-10-CM | POA: Insufficient documentation

## 2024-12-14 DIAGNOSIS — R160 Hepatomegaly, not elsewhere classified: Secondary | ICD-10-CM | POA: Insufficient documentation

## 2024-12-14 MED ORDER — CYANOCOBALAMIN 1000 MCG/ML IJ SOLN
1000.0000 ug | Freq: Once | INTRAMUSCULAR | Status: AC
  Administered 2024-12-14: 1000 ug via INTRAMUSCULAR

## 2024-12-14 NOTE — Progress Notes (Signed)
 "   Patient ID: [name], male    DOB: 1940/12/08, 84 y.o.   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 Swelling has worsened over three months with dusky coloration. No pain, fever, or chills. Differential diagnosis includes DVT due to chronic leukemia increasing clotting risk. No history of blood clots or clotting disorders. Good radial pulse and sensation present. Reviewed with PCP, Dr. Katrinka, and he is agreeable with plan.  - Ordered stat vascular ultrasound of the upper extremity to rule out DVT - Referred to DVT clinic if ultrasound is positive for DVT - Advised to go to the ER if chest pain, shortness of breath, or severe pain occurs  Chronic lymphocytic leukemia Diagnosed in 2015, currently on chemotherapy since July 2025. No recent fevers or chills. Night sweats possibly related to leukemia. No current blood thinner use. Leukemia increases risk of clotting, contributing to concern for DVT in the right arm.  Vitamin B12 deficiency Received B12 injection today.      Return with Dr. Katrinka.    Subjective:    Chief Complaint  Patient presents with   Arm Swelling    Pt was triaged to come in office for acute visit; pt c/o arm swelling for past few weeks; swelling in right arm only; pt has had skin cancer removed a few months ago and has had swelling since then.     HPI Discussed the use of AI scribe software for clinical note transcription with the patient, who gave verbal consent to proceed.  History of Present  Illness [name] is an 84 year old male with Parkinson's disease and chronic lymphocytic leukemia who presents with right arm swelling post basal cell cancer removal. He is accompanied by his wife.  The swelling in his right arm began after the removal of a basal cell cancer from his forearm in October. Over the past three months, the swelling has persisted and worsened, extending up the arm. Initially, the surgical site appeared to be healing, but the swelling has become more pronounced. His wife describes the swelling as 'bigger and tighter'. He has not experienced any pain, although there was some discomfort previously. No history of blood clots, and he is not on any blood thinners. No fevers or chills.  He has a history of Parkinson's disease and experiences dizziness related to this condition. His wife assists in monitoring his balance. He follows with Dr. Corlis in Neurology for management of Parkinson's.  He was diagnosed with chronic lymphocytic leukemia in 2015 and is under the care of an oncologist. He began chemotherapy in July 2025. His wife mentioned that the night sweats might be related to his leukemia.  He also has a B12 deficiency.     Past Medical History:  Diagnosis Date   Arthritis    Basal cell carcinoma 03/18/2017   left neck CX3 5FU   Bell's palsy 05/08/2020   Cataract    Chronic bilateral low back pain without sciatica 12/12/2015  Chronic lymphocytic leukemia 05/16/2014   Oncology q6 months. Thought to be agent orange related-CLL and prostate cancer  Diagnosis 1. 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 - NO EVIDENCE OF EXTRAPROSTATIC EXTENSION, ANGIOLYMPHATIC INVASION OR SEMINAL VESICLE INVOLVEMENT IDENTIFIED. - RESECTION MARGINS, NEGATIVE FOR ATYPIA OR MALIGNANCY. 2. Lymph nodes, regional resection, righ   Chronic pain syndrome 02/01/2016   Chronic right hip pain 09/27/2019   DDD (degenerative  disc disease), lumbar    Diabetic retinopathy 09/24/2007   Diverticulosis of colon    Elevated PSA    Encounter for administrative examinations, unspecified 12/14/2024   Erectile dysfunction 12/30/2007   No rx.      Exposure to potentially hazardous substance 12/14/2024   Apr 02, 2023 Entered By: JENEL MONT HERO Comment: Entered automatically through [facility] Problem List documentation program     GERD (gastroesophageal reflux disease)    Gynecomastia 12/28/2020   History of colonic polyps 08/22/2008   2004 3 adenomas 2009 none 2013 none 02/26/2017 8 mm sigmoid polyp was inflammatory - no recall planned given age/co-morbidities overall hx of polyps     History of prostate cancer 01/24/2014   Follows with Dr. Brunetta every 3 months. Dr. Amadeo every 6 months.  Diagnosis 1. 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 - NO EVIDENCE OF EXTRAPROSTATIC EXTENSION, ANGIOLYMPHATIC INVASION OR SEMINAL VESICLE INVOLVEMENT IDENTIFIED. - RESECTION MARGINS, NEGATIVE FOR ATYPIA OR MALIGNANCY. 2. Lymph nodes, regional resection, right pelvic - SIX   Hyperlipidemia associated with type 2 diabetes mellitus 09/24/2007   High triglycerides. Refused statin due to myalgias in past but then started half tablet of simvastatin  40mg  through [facility] of this note might be different from the original. Formatting of this note might be different from the original. High triglycerides. Refused statin due to myalgias in past but then started half tablet of simvastatin  40mg  through [facility]  Last Assessment & Plan:  Formatting    Hypertension associated with diabetes 09/24/2007   Amlodipine  10mg , losartan  100mg , HCTZ 25mg , propranolol  40 mg twice a day Home cuff 142/85 compared to 134/72 on my check. Likely home cuff about 10 points higher.   --> chlorthalidone  03/20/16 but patient didn't change and then BP controlled on next visit   Formatting of this note might be different from the  original. Formatting of this note might be different from the original. 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  Follow up in 6 months unless wound issues arise.  Derm follow up in October 2022 scheduled.  I will see him back in 6 months.   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/2022   OSA on CPAP 09/24/2007   Parkinson's disease 12/20/2021   Person encountering health services to consult on behalf of another person 12/14/2024   PONV (postoperative nausea and vomiting)    PTSD (post-traumatic stress disorder)    [facility]   Sacroiliac joint dysfunction 06/29/2018   Senile purpura 05/02/2020   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 myelopathy or radiculopathy 02/01/2016  Temporomandibular joint (TMJ) pain 04/14/2017   Tremor 08/11/2015   Type 2 diabetes mellitus 09/24/2007   Lantus  102 units, novolog  46 units 3x a day with meals . a1c under 8     Formatting of this note might be different from the original. Formatting of this note might be different from the original. And macular degeneration.   Last Assessment & Plan:  Formatting of this note might be different from the original. S: diabetic retinopathy followed up by optho yesterday and largely stable. He also has m   Vitamin D  deficiency 11/01/2020   At [facility]- on 2000 units a day at least since 2021     Past Surgical History:  Procedure Laterality Date   CHOLECYSTECTOMY     COLONOSCOPY  03/25/12, 09/28/08   ESOPHAGOGASTRODUODENOSCOPY N/A 10/12/2014   Procedure: ESOPHAGOGASTRODUODENOSCOPY (EGD);  Surgeon: Lupita FORBES Commander, MD;   Location: [facility];  Service: Endoscopy;  Laterality: N/A;   ESOPHAGOGASTRODUODENOSCOPY ENDOSCOPY  09/28/08   EXCISION MELANOMA WITH SENTINEL LYMPH NODE BIOPSY Right 05/31/2021   Procedure: WIDE LOCAL EXCISION RIGHT FOREARM WITH ADVANCEDMENT FLAP CLOSURE WITH SENTINEL LYMPH NODE MAPPING AND BIOSPY;  Surgeon: Aron Shoulders, MD;  Location: [facility];  Service: General;  Laterality: Right;   FOOT SURGERY Bilateral    HEMORRHOID SURGERY     LAPAROSCOPIC GASTRIC BANDING  03/05/11   weight loss   LAPAROSCOPIC GASTRIC BANDING WITH HIATAL HERNIA REPAIR  03/05/2011   LYMPHADENECTOMY Bilateral 01/24/2014   Procedure: LYMPHADENECTOMY;  Surgeon: Noretta Ferrara, MD;  Location: [facility];  Service: Urology;  Laterality: Bilateral;   ORIF TIBIA FRACTURE Right    PENILE PROSTHESIS IMPLANT     PROSTATE SURGERY  01/2014   ROBOT ASSISTED LAPAROSCOPIC RADICAL PROSTATECTOMY N/A 01/24/2014   Procedure: ROBOTIC ASSISTED LAPAROSCOPIC RADICAL PROSTATECTOMY LEVEL 3;  Surgeon: Noretta Ferrara, MD;  Location: [facility];  Service: Urology;  Laterality: N/A;   SHOULDER SURGERY Right 2011   TONSILLECTOMY     age 29   VASECTOMY      Family History  Problem Relation Age of Onset   Hypertension Mother    Stomach cancer Mother 13   Alcohol abuse Brother    Diabetes Child    Hypertension Child    Hypertension Paternal Uncle    Heart attack Paternal Uncle    Parkinson's disease Other        several cousins and uncles   Colon cancer Neg Hx    Colon polyps Neg Hx    Rectal cancer Neg Hx     Social History[1]   Allergies[2]  Review of Systems NEGATIVE UNLESS OTHERWISE INDICATED IN HPI      Objective:     BP (!) 124/58 (BP Location: Left Arm, Patient Position: Sitting, Cuff Size: Large)   Pulse 94   Temp 97.9 F (36.6 C) (Temporal)   Ht 6' (1.829 m)   Wt 247 lb 12.8 oz (112.4 kg)   SpO2 97%   BMI 33.61 kg/m   Wt Readings from Last 3 Encounters:  12/14/24 247 lb 12.8 oz (112.4 kg)  11/11/24 249 lb  (112.9 kg)  09/16/24 244 lb 14.4 oz (111.1 kg)    BP Readings from Last 3 Encounters:  12/14/24 (!) 124/58  11/11/24 132/82  09/16/24 98/60     Physical Exam Vitals and nursing note reviewed.  Constitutional:      Appearance: Normal appearance. He is obese.  Cardiovascular:     Rate and Rhythm: Normal rate.     Pulses:  Normal pulses.  Pulmonary:     Effort: Pulmonary effort is normal.  Musculoskeletal:        General: Swelling (RUE from proximal phalanges to mid-upper bicep, circumferential edema, discoloration RUE, N/V intact; ROM WNL of RUE; see photos below) present.  Skin:    Findings: Bruising present.  Neurological:     Mental Status: He is alert.  Psychiatric:        Mood and Affect: Mood normal.        Behavior: Behavior normal.                Trenell Moxey M Blakelynn Scheeler, PA-C    [1]  Social History Tobacco Use   Smoking status: Former    Current packs/day: 0.00    Average packs/day: 1 pack/day for 20.0 years (20.0 ttl pk-yrs)    Types: Cigarettes    Start date: 02/25/1961    Quit date: 02/25/1981    Years since quitting: 43.8   Smokeless tobacco: Never  Vaping Use   Vaping status: Never Used  Substance Use Topics   Alcohol use: Not Currently    Comment: stopped drinking 76 or 77    Drug use: No  [2]  Allergies Allergen Reactions   Morphine Sulfate Itching and Rash   Atorvastatin      Other Reaction(s): Myositis   Lisinopril Cough   Lovastatin    "

## 2024-12-14 NOTE — Progress Notes (Signed)
 Left a detailed voicemail per patient DPR.

## 2024-12-14 NOTE — Patient Instructions (Signed)
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 We also reviewed your chronic lymphocytic leukemia and vitamin B12 deficiency.  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 -We ordered an urgent vascular ultrasound of your upper arm to check for a blood clot (DVT). -If the ultrasound shows a blood clot, you will be referred to the DVT clinic. -Go to the ER if you experience chest pain, shortness of breath, or severe pain.  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 This condition increases your risk of blood clots. -Continue with your current chemotherapy regimen.  VITAMIN B12 DEFICIENCY: You have a vitamin B12 deficiency. -You received a B12 injection today.                      Contains text generated by [employer].                                 Contains text generated by [employer].   "

## 2024-12-16 ENCOUNTER — Ambulatory Visit: Payer: Self-pay | Admitting: Family Medicine

## 2024-12-16 ENCOUNTER — Ambulatory Visit
Admission: RE | Admit: 2024-12-16 | Discharge: 2024-12-16 | Disposition: A | Source: Ambulatory Visit | Attending: Family Medicine | Admitting: Family Medicine

## 2024-12-16 ENCOUNTER — Ambulatory Visit

## 2024-12-16 ENCOUNTER — Other Ambulatory Visit (INDEPENDENT_AMBULATORY_CARE_PROVIDER_SITE_OTHER)

## 2024-12-16 DIAGNOSIS — R600 Localized edema: Secondary | ICD-10-CM

## 2024-12-16 LAB — COMPREHENSIVE METABOLIC PANEL WITH GFR
ALT: 5 U/L (ref 3–53)
AST: 9 U/L (ref 5–37)
Albumin: 4.1 g/dL (ref 3.5–5.2)
Alkaline Phosphatase: 55 U/L (ref 39–117)
BUN: 11 mg/dL (ref 6–23)
CO2: 29 meq/L (ref 19–32)
Calcium: 9.1 mg/dL (ref 8.4–10.5)
Chloride: 101 meq/L (ref 96–112)
Creatinine, Ser: 0.9 mg/dL (ref 0.40–1.50)
GFR: 80.19 mL/min
Glucose, Bld: 188 mg/dL — ABNORMAL HIGH (ref 70–99)
Potassium: 3.4 meq/L — ABNORMAL LOW (ref 3.5–5.1)
Sodium: 137 meq/L (ref 135–145)
Total Bilirubin: 1.3 mg/dL — ABNORMAL HIGH (ref 0.2–1.2)
Total Protein: 6.1 g/dL (ref 6.0–8.3)

## 2024-12-16 LAB — CBC WITH DIFFERENTIAL/PLATELET
Basophils Absolute: 0 K/uL (ref 0.0–0.1)
Basophils Relative: 0.3 % (ref 0.0–3.0)
Eosinophils Absolute: 0.3 K/uL (ref 0.0–0.7)
Eosinophils Relative: 2.2 % (ref 0.0–5.0)
HCT: 41.9 % (ref 39.0–52.0)
Hemoglobin: 14.3 g/dL (ref 13.0–17.0)
Lymphocytes Relative: 51.9 % — ABNORMAL HIGH (ref 12.0–46.0)
Lymphs Abs: 6.8 K/uL — ABNORMAL HIGH (ref 0.7–4.0)
MCHC: 34 g/dL (ref 30.0–36.0)
MCV: 83.5 fl (ref 78.0–100.0)
Monocytes Absolute: 0.8 K/uL (ref 0.1–1.0)
Monocytes Relative: 6.1 % (ref 3.0–12.0)
Neutro Abs: 5.2 K/uL (ref 1.4–7.7)
Neutrophils Relative %: 39.5 % — ABNORMAL LOW (ref 43.0–77.0)
Platelets: 215 K/uL (ref 150.0–400.0)
RBC: 5.02 Mil/uL (ref 4.22–5.81)
RDW: 16.4 % — ABNORMAL HIGH (ref 11.5–15.5)
WBC: 13.1 K/uL — ABNORMAL HIGH (ref 4.0–10.5)

## 2024-12-17 ENCOUNTER — Encounter: Payer: Self-pay | Admitting: Neurology

## 2024-12-17 ENCOUNTER — Encounter: Payer: Self-pay | Admitting: Hematology

## 2024-12-21 ENCOUNTER — Ambulatory Visit

## 2024-12-23 ENCOUNTER — Other Ambulatory Visit: Payer: Self-pay

## 2024-12-23 ENCOUNTER — Other Ambulatory Visit: Payer: Self-pay | Admitting: Hematology

## 2024-12-23 DIAGNOSIS — C9110 Chronic lymphocytic leukemia of B-cell type not having achieved remission: Secondary | ICD-10-CM

## 2024-12-23 MED ORDER — CALQUENCE 100 MG PO TABS
100.0000 mg | ORAL_TABLET | Freq: Two times a day (BID) | ORAL | 2 refills | Status: AC
  Filled 2024-12-23: qty 60, 30d supply, fill #0

## 2024-12-23 NOTE — Progress Notes (Signed)
 Specialty Pharmacy Refill Coordination Note  [name] is a 84 y.o. male contacted today regarding refills of specialty medication(s) Acalabrutinib  Maleate (Calquence )   Patient requested Delivery   Delivery date: 01/04/25   Verified address: [address]   [place], [place]  [id number]   Medication will be filled on: 01/03/25    This fill date is pending response to refill request from provider. Patient is aware and if they have not received fill by intended date they must follow up with pharmacy.

## 2024-12-23 NOTE — Progress Notes (Signed)
 Specialty Pharmacy Ongoing Clinical Assessment Note  [name] is a 84 y.o. male who is being followed by the specialty pharmacy service for [facility]   Patient's specialty medication(s) reviewed today: Acalabrutinib  Maleate (Calquence )   Missed doses in the last 4 weeks: 0   Patient/Caregiver did not have any additional questions or concerns.   Therapeutic benefit summary: Patient is achieving benefit   Adverse events/side effects summary: No adverse events/side effects   Patient's therapy is appropriate to: Continue    Goals Addressed             This Visit's Progress    Slow Disease Progression   On track    Patient is on track. Patient will maintain adherence. Per Dr. Demetra note from 09/16/24, patient has shown laboratory and clinical improvement and current regimen is appropriate to continue.          Follow up: 3 months  [facility]

## 2024-12-27 ENCOUNTER — Telehealth: Payer: Self-pay | Admitting: Hematology

## 2024-12-27 NOTE — Telephone Encounter (Signed)
 Reschedule appointments due to cancer center opening at 10a. Talked with the patients wife and she is aware of the changes made to the patients upcoming appointments.

## 2024-12-28 ENCOUNTER — Inpatient Hospital Stay

## 2024-12-28 ENCOUNTER — Inpatient Hospital Stay: Admitting: Hematology

## 2024-12-30 ENCOUNTER — Other Ambulatory Visit: Payer: Self-pay

## 2025-01-03 ENCOUNTER — Other Ambulatory Visit: Payer: Self-pay

## 2025-01-10 ENCOUNTER — Inpatient Hospital Stay: Admitting: Hematology

## 2025-01-10 ENCOUNTER — Inpatient Hospital Stay

## 2025-01-14 ENCOUNTER — Encounter (INDEPENDENT_AMBULATORY_CARE_PROVIDER_SITE_OTHER): Admitting: Ophthalmology

## 2025-01-17 ENCOUNTER — Ambulatory Visit: Admitting: Family Medicine

## 2025-01-18 ENCOUNTER — Ambulatory Visit: Admitting: Family Medicine

## 2025-05-19 ENCOUNTER — Ambulatory Visit: Payer: Self-pay | Admitting: Neurology

## 2025-05-30 ENCOUNTER — Ambulatory Visit
